# Patient Record
Sex: Male | Born: 1935 | Race: White | Hispanic: No | Marital: Married | Smoking: Former smoker
Health system: Southern US, Community
[De-identification: ages and names within clinical notes are randomized; demographics above are authoritative.]

## PROBLEM LIST (undated history)

## (undated) DIAGNOSIS — H6983 Other specified disorders of Eustachian tube, bilateral: Secondary | ICD-10-CM

## (undated) DIAGNOSIS — R918 Other nonspecific abnormal finding of lung field: Secondary | ICD-10-CM

## (undated) DIAGNOSIS — K219 Gastro-esophageal reflux disease without esophagitis: Secondary | ICD-10-CM

## (undated) DIAGNOSIS — M1990 Unspecified osteoarthritis, unspecified site: Secondary | ICD-10-CM

## (undated) DIAGNOSIS — J90 Pleural effusion, not elsewhere classified: Secondary | ICD-10-CM

## (undated) DIAGNOSIS — R1310 Dysphagia, unspecified: Secondary | ICD-10-CM

## (undated) DIAGNOSIS — R079 Chest pain, unspecified: Secondary | ICD-10-CM

## (undated) DIAGNOSIS — S22000A Wedge compression fracture of unspecified thoracic vertebra, initial encounter for closed fracture: Secondary | ICD-10-CM

## (undated) DIAGNOSIS — S336XXA Sprain of sacroiliac joint, initial encounter: Secondary | ICD-10-CM

## (undated) DIAGNOSIS — R0602 Shortness of breath: Secondary | ICD-10-CM

## (undated) DIAGNOSIS — R590 Localized enlarged lymph nodes: Secondary | ICD-10-CM

## (undated) DIAGNOSIS — F430 Acute stress reaction: Secondary | ICD-10-CM

## (undated) DIAGNOSIS — C3492 Malignant neoplasm of unspecified part of left bronchus or lung: Secondary | ICD-10-CM

## (undated) DIAGNOSIS — M47812 Spondylosis without myelopathy or radiculopathy, cervical region: Secondary | ICD-10-CM

## (undated) DIAGNOSIS — J84112 Idiopathic pulmonary fibrosis: Secondary | ICD-10-CM

## (undated) DIAGNOSIS — H2511 Age-related nuclear cataract, right eye: Secondary | ICD-10-CM

## (undated) DIAGNOSIS — M6740 Ganglion, unspecified site: Secondary | ICD-10-CM

## (undated) DIAGNOSIS — J439 Emphysema, unspecified: Secondary | ICD-10-CM

## (undated) DIAGNOSIS — C801 Malignant (primary) neoplasm, unspecified: Secondary | ICD-10-CM

## (undated) DIAGNOSIS — M7710 Lateral epicondylitis, unspecified elbow: Secondary | ICD-10-CM

## (undated) DIAGNOSIS — Z961 Presence of intraocular lens: Secondary | ICD-10-CM

## (undated) DIAGNOSIS — F419 Anxiety disorder, unspecified: Secondary | ICD-10-CM

## (undated) DIAGNOSIS — C3490 Malignant neoplasm of unspecified part of unspecified bronchus or lung: Secondary | ICD-10-CM

## (undated) DIAGNOSIS — IMO0001 Reserved for inherently not codable concepts without codable children: Secondary | ICD-10-CM

## (undated) DIAGNOSIS — K648 Other hemorrhoids: Secondary | ICD-10-CM

## (undated) DIAGNOSIS — M7989 Other specified soft tissue disorders: Secondary | ICD-10-CM

## (undated) DIAGNOSIS — G8929 Other chronic pain: Secondary | ICD-10-CM

## (undated) DIAGNOSIS — H04203 Unspecified epiphora, bilateral lacrimal glands: Secondary | ICD-10-CM

## (undated) DIAGNOSIS — J701 Chronic and other pulmonary manifestations due to radiation: Secondary | ICD-10-CM

## (undated) DIAGNOSIS — R112 Nausea with vomiting, unspecified: Secondary | ICD-10-CM

## (undated) DIAGNOSIS — J00 Acute nasopharyngitis [common cold]: Secondary | ICD-10-CM

## (undated) HISTORY — DX: Malignant (primary) neoplasm, unspecified: C80.1

## (undated) HISTORY — DX: Lateral epicondylitis, unspecified elbow: M77.10

## (undated) HISTORY — DX: Dysphagia, unspecified: R13.10

## (undated) HISTORY — DX: Spondylosis without myelopathy or radiculopathy, cervical region: M47.812

## (undated) HISTORY — DX: Unspecified osteoarthritis, unspecified site: M19.90

---
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
## 1999-10-07 ENCOUNTER — Ambulatory Visit (HOSPITAL_COMMUNITY): Admission: RE | Admit: 1999-10-07 | Discharge: 1999-10-07 | Payer: Self-pay | Admitting: *Deleted

## 1999-10-07 ENCOUNTER — Encounter: Payer: Self-pay | Admitting: *Deleted

## 2001-06-09 HISTORY — PX: COLONOSCOPY: SHX174

## 2001-06-14 ENCOUNTER — Encounter: Payer: Self-pay | Admitting: Internal Medicine

## 2001-06-14 ENCOUNTER — Ambulatory Visit (HOSPITAL_COMMUNITY): Admission: RE | Admit: 2001-06-14 | Discharge: 2001-06-14 | Payer: Self-pay | Admitting: Gastroenterology

## 2001-06-14 ENCOUNTER — Encounter: Payer: Self-pay | Admitting: Gastroenterology

## 2004-02-10 ENCOUNTER — Encounter: Admission: RE | Admit: 2004-02-10 | Discharge: 2004-02-10 | Payer: Self-pay | Admitting: Internal Medicine

## 2004-04-30 ENCOUNTER — Ambulatory Visit: Payer: Self-pay | Admitting: Internal Medicine

## 2004-06-09 HISTORY — PX: KNEE ARTHROSCOPY: SUR90

## 2004-12-06 ENCOUNTER — Ambulatory Visit: Payer: Self-pay | Admitting: Internal Medicine

## 2004-12-25 ENCOUNTER — Ambulatory Visit: Payer: Self-pay | Admitting: Internal Medicine

## 2005-04-12 ENCOUNTER — Ambulatory Visit: Payer: Self-pay | Admitting: Internal Medicine

## 2005-11-18 ENCOUNTER — Ambulatory Visit: Payer: Self-pay | Admitting: Internal Medicine

## 2005-11-27 ENCOUNTER — Ambulatory Visit: Payer: Self-pay | Admitting: Internal Medicine

## 2006-03-10 ENCOUNTER — Ambulatory Visit: Payer: Self-pay | Admitting: Internal Medicine

## 2006-06-12 ENCOUNTER — Ambulatory Visit: Payer: Self-pay | Admitting: Internal Medicine

## 2006-06-12 LAB — CONVERTED CEMR LAB
ALT: 15 units/L (ref 0–40)
AST: 17 units/L (ref 0–37)
Albumin: 3 g/dL — ABNORMAL LOW (ref 3.5–5.2)
Alkaline Phosphatase: 51 units/L (ref 39–117)
BUN: 14 mg/dL (ref 6–23)
Bilirubin, Direct: 0.1 mg/dL (ref 0.0–0.3)
Creatinine, Ser: 0.9 mg/dL (ref 0.4–1.5)
PSA: 1.25 ng/mL (ref 0.10–4.00)
Potassium: 4.3 meq/L (ref 3.5–5.1)
Total Bilirubin: 0.8 mg/dL (ref 0.3–1.2)
Total Protein: 5.9 g/dL — ABNORMAL LOW (ref 6.0–8.3)

## 2006-06-29 ENCOUNTER — Ambulatory Visit: Payer: Self-pay | Admitting: Internal Medicine

## 2006-11-19 ENCOUNTER — Ambulatory Visit: Payer: Self-pay | Admitting: Internal Medicine

## 2007-02-18 ENCOUNTER — Ambulatory Visit: Payer: Self-pay | Admitting: Internal Medicine

## 2007-02-18 DIAGNOSIS — K648 Other hemorrhoids: Secondary | ICD-10-CM | POA: Insufficient documentation

## 2007-02-18 HISTORY — DX: Other hemorrhoids: K64.8

## 2007-02-18 LAB — CONVERTED CEMR LAB
ALT: 16 units/L (ref 0–53)
AST: 17 units/L (ref 0–37)
Albumin: 3 g/dL — ABNORMAL LOW (ref 3.5–5.2)
Alkaline Phosphatase: 51 units/L (ref 39–117)
BUN: 12 mg/dL (ref 6–23)
Basophils Absolute: 0 10*3/uL (ref 0.0–0.1)
Basophils Relative: 0.3 % (ref 0.0–1.0)
Bilirubin, Direct: 0.1 mg/dL (ref 0.0–0.3)
CO2: 29 meq/L (ref 19–32)
Calcium: 8.6 mg/dL (ref 8.4–10.5)
Chloride: 108 meq/L (ref 96–112)
Cholesterol: 215 mg/dL (ref 0–200)
Creatinine, Ser: 0.9 mg/dL (ref 0.4–1.5)
Direct LDL: 147.9 mg/dL
Eosinophils Absolute: 0.1 10*3/uL (ref 0.0–0.6)
Eosinophils Relative: 1.7 % (ref 0.0–5.0)
GFR calc Af Amer: 106 mL/min
GFR calc non Af Amer: 88 mL/min
Glucose, Bld: 105 mg/dL — ABNORMAL HIGH (ref 70–99)
HCT: 40.3 % (ref 39.0–52.0)
HDL: 45.2 mg/dL (ref >39.0)
Hemoglobin: 14 g/dL (ref 13.0–17.0)
Lymphocytes Relative: 28.9 % (ref 12.0–46.0)
MCHC: 34.7 g/dL (ref 30.0–36.0)
MCV: 90.9 fL (ref 78.0–100.0)
Monocytes Absolute: 0.6 10*3/uL (ref 0.2–0.7)
Monocytes Relative: 10.2 % (ref 3.0–11.0)
Neutro Abs: 3.1 10*3/uL (ref 1.4–7.7)
Neutrophils Relative %: 58.9 % (ref 43.0–77.0)
Platelets: 195 10*3/uL (ref 150–400)
Potassium: 4.5 meq/L (ref 3.5–5.1)
RBC: 4.44 M/uL (ref 4.22–5.81)
RDW: 12.3 % (ref 11.5–14.6)
Sodium: 139 meq/L (ref 135–145)
TSH: 1.38 microintl units/mL (ref 0.35–5.50)
Total Bilirubin: 0.7 mg/dL (ref 0.3–1.2)
Total CHOL/HDL Ratio: 4.8
Total Protein: 5.4 g/dL — ABNORMAL LOW (ref 6.0–8.3)
Triglycerides: 112 mg/dL (ref 0–149)
VLDL: 22 mg/dL (ref 0–40)
WBC: 5.4 10*3/uL (ref 4.5–10.5)

## 2007-03-01 ENCOUNTER — Telehealth: Payer: Self-pay | Admitting: *Deleted

## 2007-03-02 ENCOUNTER — Telehealth: Payer: Self-pay | Admitting: *Deleted

## 2007-04-22 ENCOUNTER — Ambulatory Visit: Payer: Self-pay | Admitting: Internal Medicine

## 2007-04-22 DIAGNOSIS — M799 Soft tissue disorder, unspecified: Secondary | ICD-10-CM

## 2007-04-22 DIAGNOSIS — M6740 Ganglion, unspecified site: Secondary | ICD-10-CM

## 2007-04-22 DIAGNOSIS — M719 Bursopathy, unspecified: Secondary | ICD-10-CM

## 2007-04-22 DIAGNOSIS — M67919 Unspecified disorder of synovium and tendon, unspecified shoulder: Secondary | ICD-10-CM

## 2007-04-22 HISTORY — DX: Ganglion, unspecified site: M67.40

## 2007-04-22 HISTORY — DX: Soft tissue disorder, unspecified: M79.9

## 2007-09-23 ENCOUNTER — Ambulatory Visit: Payer: Self-pay | Admitting: Internal Medicine

## 2007-09-23 DIAGNOSIS — R232 Flushing: Secondary | ICD-10-CM

## 2007-09-23 DIAGNOSIS — IMO0001 Reserved for inherently not codable concepts without codable children: Secondary | ICD-10-CM

## 2007-09-23 HISTORY — DX: Reserved for inherently not codable concepts without codable children: IMO0001

## 2007-09-23 LAB — CONVERTED CEMR LAB
CRP, High Sensitivity: 2 (ref 0.00–5.00)
Cortisol, Plasma: 4.1 ug/dL
Free T4: 0.9 ng/dL (ref 0.6–1.6)
T3, Free: 2.8 pg/mL (ref 2.3–4.2)

## 2007-10-27 ENCOUNTER — Ambulatory Visit: Payer: Self-pay | Admitting: Internal Medicine

## 2007-10-27 DIAGNOSIS — F430 Acute stress reaction: Secondary | ICD-10-CM

## 2007-10-27 DIAGNOSIS — F438 Other reactions to severe stress: Secondary | ICD-10-CM

## 2007-10-27 HISTORY — DX: Acute stress reaction: F43.0

## 2008-02-21 ENCOUNTER — Ambulatory Visit: Payer: Self-pay | Admitting: Internal Medicine

## 2008-02-28 ENCOUNTER — Telehealth: Payer: Self-pay | Admitting: Internal Medicine

## 2008-03-14 ENCOUNTER — Ambulatory Visit: Payer: Self-pay | Admitting: Internal Medicine

## 2008-08-30 ENCOUNTER — Ambulatory Visit: Payer: Self-pay | Admitting: Internal Medicine

## 2008-08-30 LAB — CONVERTED CEMR LAB
ALT: 16 units/L (ref 0–53)
AST: 18 units/L (ref 0–37)
Albumin: 3.5 g/dL (ref 3.5–5.2)
Alkaline Phosphatase: 58 units/L (ref 39–117)
Bilirubin, Direct: 0.1 mg/dL (ref 0.0–0.3)
Cholesterol: 198 mg/dL (ref 0–200)
HDL: 52.9 mg/dL (ref >39.00)
LDL Cholesterol: 130 mg/dL — ABNORMAL HIGH (ref 0–99)
Total Bilirubin: 0.9 mg/dL (ref 0.3–1.2)
Total CHOL/HDL Ratio: 4
Total Protein: 6.6 g/dL (ref 6.0–8.3)
Triglycerides: 78 mg/dL (ref 0.0–149.0)
VLDL: 15.6 mg/dL (ref 0.0–40.0)

## 2008-09-06 ENCOUNTER — Ambulatory Visit: Payer: Self-pay | Admitting: Internal Medicine

## 2008-09-06 DIAGNOSIS — S336XXA Sprain of sacroiliac joint, initial encounter: Secondary | ICD-10-CM | POA: Insufficient documentation

## 2008-09-06 DIAGNOSIS — E7800 Pure hypercholesterolemia, unspecified: Secondary | ICD-10-CM

## 2008-09-06 HISTORY — DX: Sprain of sacroiliac joint, initial encounter: S33.6XXA

## 2008-09-06 LAB — CONVERTED CEMR LAB
Cholesterol, target level: 200 mg/dL
HDL goal, serum: 40 mg/dL
LDL Goal: 100 mg/dL

## 2009-02-27 ENCOUNTER — Ambulatory Visit: Payer: Self-pay | Admitting: Internal Medicine

## 2009-02-27 LAB — CONVERTED CEMR LAB
ALT: 15 units/L (ref 0–53)
AST: 18 units/L (ref 0–37)
Albumin: 3.7 g/dL (ref 3.5–5.2)
Alkaline Phosphatase: 56 units/L (ref 39–117)
BUN: 17 mg/dL (ref 6–23)
Basophils Absolute: 0 10*3/uL (ref 0.0–0.1)
Basophils Relative: 0.7 % (ref 0.0–3.0)
Bilirubin, Direct: 0.1 mg/dL (ref 0.0–0.3)
CO2: 26 meq/L (ref 19–32)
Calcium: 8.9 mg/dL (ref 8.4–10.5)
Chloride: 108 meq/L (ref 96–112)
Cholesterol: 199 mg/dL (ref 0–200)
Creatinine, Ser: 0.8 mg/dL (ref 0.4–1.5)
Eosinophils Absolute: 0.1 10*3/uL (ref 0.0–0.7)
Eosinophils Relative: 2 % (ref 0.0–5.0)
GFR calc non Af Amer: 100.1 mL/min (ref >60)
Glucose, Bld: 100 mg/dL — ABNORMAL HIGH (ref 70–99)
HCT: 43 % (ref 39.0–52.0)
HDL: 51.4 mg/dL (ref >39.00)
Hemoglobin: 14.9 g/dL (ref 13.0–17.0)
LDL Cholesterol: 125 mg/dL — ABNORMAL HIGH (ref 0–99)
Lymphocytes Relative: 31.2 % (ref 12.0–46.0)
Lymphs Abs: 1.9 10*3/uL (ref 0.7–4.0)
MCHC: 34.6 g/dL (ref 30.0–36.0)
MCV: 90.6 fL (ref 78.0–100.0)
Monocytes Absolute: 0.5 10*3/uL (ref 0.1–1.0)
Monocytes Relative: 8.1 % (ref 3.0–12.0)
Neutro Abs: 3.5 10*3/uL (ref 1.4–7.7)
Neutrophils Relative %: 58 % (ref 43.0–77.0)
PSA: 1.95 ng/mL (ref 0.10–4.00)
Platelets: 170 10*3/uL (ref 150.0–400.0)
Potassium: 4.1 meq/L (ref 3.5–5.1)
RBC: 4.74 M/uL (ref 4.22–5.81)
RDW: 12.3 % (ref 11.5–14.6)
Sodium: 140 meq/L (ref 135–145)
TSH: 1.03 microintl units/mL (ref 0.35–5.50)
Total Bilirubin: 0.9 mg/dL (ref 0.3–1.2)
Total CHOL/HDL Ratio: 4
Total Protein: 6.9 g/dL (ref 6.0–8.3)
Triglycerides: 113 mg/dL (ref 0.0–149.0)
VLDL: 22.6 mg/dL (ref 0.0–40.0)
WBC: 6 10*3/uL (ref 4.5–10.5)

## 2009-03-28 ENCOUNTER — Ambulatory Visit: Payer: Self-pay | Admitting: Internal Medicine

## 2009-08-28 ENCOUNTER — Ambulatory Visit: Payer: Self-pay | Admitting: Internal Medicine

## 2009-12-26 LAB — HM DIABETES EYE EXAM

## 2010-05-07 ENCOUNTER — Ambulatory Visit: Payer: Self-pay | Admitting: Internal Medicine

## 2010-05-07 ENCOUNTER — Telehealth: Payer: Self-pay | Admitting: *Deleted

## 2010-05-07 ENCOUNTER — Encounter: Payer: Self-pay | Admitting: Gastroenterology

## 2010-05-07 DIAGNOSIS — R1314 Dysphagia, pharyngoesophageal phase: Secondary | ICD-10-CM

## 2010-05-07 LAB — CONVERTED CEMR LAB
Albumin: 4 g/dL (ref 3.5–5.2)
Alkaline Phosphatase: 60 units/L (ref 39–117)
BUN: 16 mg/dL (ref 6–23)
CO2: 26 meq/L (ref 19–32)
Calcium: 8.9 mg/dL (ref 8.4–10.5)
Chloride: 106 meq/L (ref 96–112)
Direct LDL: 129.2 mg/dL
GFR calc non Af Amer: 98.36 mL/min (ref >60)
Glucose, Bld: 103 mg/dL — ABNORMAL HIGH (ref 70–99)
HDL: 57.1 mg/dL (ref >39.00)
Potassium: 4.4 meq/L (ref 3.5–5.1)

## 2010-05-08 ENCOUNTER — Encounter: Payer: Self-pay | Admitting: Internal Medicine

## 2010-06-20 ENCOUNTER — Ambulatory Visit: Admission: RE | Admit: 2010-06-20 | Payer: Self-pay | Source: Home / Self Care | Admitting: Gastroenterology

## 2010-07-07 LAB — CONVERTED CEMR LAB
ALT: 17 units/L (ref 0–53)
AST: 20 units/L (ref 0–37)
Albumin: 3.5 g/dL (ref 3.5–5.2)
Alkaline Phosphatase: 66 units/L (ref 39–117)
BUN: 16 mg/dL (ref 6–23)
Basophils Absolute: 0 10*3/uL (ref 0.0–0.1)
Basophils Relative: 0.5 % (ref 0.0–3.0)
Bilirubin, Direct: 0.1 mg/dL (ref 0.0–0.3)
CO2: 25 meq/L (ref 19–32)
Calcium: 8.4 mg/dL (ref 8.4–10.5)
Chloride: 113 meq/L — ABNORMAL HIGH (ref 96–112)
Cholesterol: 188 mg/dL (ref 0–200)
Creatinine, Ser: 0.9 mg/dL (ref 0.4–1.5)
Eosinophils Absolute: 0.1 10*3/uL (ref 0.0–0.7)
Eosinophils Relative: 0.8 % (ref 0.0–5.0)
GFR calc Af Amer: 106 mL/min
GFR calc non Af Amer: 88 mL/min
Glucose, Bld: 104 mg/dL — ABNORMAL HIGH (ref 70–99)
HCT: 43.5 % (ref 39.0–52.0)
HDL: 42 mg/dL (ref >39.0)
Hemoglobin: 15.2 g/dL (ref 13.0–17.0)
LDL Cholesterol: 129 mg/dL — ABNORMAL HIGH (ref 0–99)
Lymphocytes Relative: 17.1 % (ref 12.0–46.0)
MCHC: 35 g/dL (ref 30.0–36.0)
MCV: 89.8 fL (ref 78.0–100.0)
Monocytes Absolute: 0.7 10*3/uL (ref 0.1–1.0)
Monocytes Relative: 8.9 % (ref 3.0–12.0)
Neutro Abs: 5.9 10*3/uL (ref 1.4–7.7)
Neutrophils Relative %: 72.7 % (ref 43.0–77.0)
PSA: 1.87 ng/mL (ref 0.10–4.00)
Platelets: 171 10*3/uL (ref 150–400)
Potassium: 4.2 meq/L (ref 3.5–5.1)
RBC: 4.84 M/uL (ref 4.22–5.81)
RDW: 12.7 % (ref 11.5–14.6)
Sodium: 142 meq/L (ref 135–145)
TSH: 1.35 microintl units/mL (ref 0.35–5.50)
Total Bilirubin: 0.8 mg/dL (ref 0.3–1.2)
Total CHOL/HDL Ratio: 4.5
Total Protein: 6.6 g/dL (ref 6.0–8.3)
Triglycerides: 83 mg/dL (ref 0–149)
VLDL: 17 mg/dL (ref 0–40)
WBC: 8.1 10*3/uL (ref 4.5–10.5)

## 2010-07-09 NOTE — Letter (Signed)
Summary: New Patient letter  [facility]  [address] [place], [place] [address]   Phone: [phone]  Fax: [phone]       05/07/2010 MRN: [id number]  Jason Summers [address] [place], [place]  [address]  Dear Jason Summers,  Welcome to the Gastroenterology Division at [facility].    You are scheduled to see Dr.  Deatra Ina on 06-20-10 at [place] on the 3rd floor at [facility] [place] [employer].  We ask that you try to arrive at our office 15 minutes prior to your appointment time to allow for check-in.  We would like you to complete the enclosed self-administered evaluation form prior to your visit and bring it with you on the day of your appointment.  We will review it with you.  Also, please bring a complete list of all your medications or, if you prefer, bring the medication bottles and we will list them.  Please bring your insurance card so that we may make a copy of it.  If your insurance requires a referral to see a specialist, please bring your referral form from your primary care physician.  Co-payments are due at the time of your visit and may be paid by cash, check or credit card.     Your office visit will consist of a consult with your physician (includes a physical exam), any laboratory testing he/she may order, scheduling of any necessary diagnostic testing (e.g. x-ray, ultrasound, CT-scan), and scheduling of a procedure (e.g. Endoscopy, Colonoscopy) if required.  Please allow enough time on your schedule to allow for any/all of these possibilities.    If you cannot keep your appointment, please call [phone] to cancel or reschedule prior to your appointment date.  This allows [place] the opportunity to schedule an appointment for another patient in need of care.  If you do not cancel or reschedule by 5 p.m. the business day prior to your appointment date, you will be charged a $50.00 late cancellation/no-show fee.    Thank you for choosing [facility] for your medical needs.  We appreciate the opportunity to care for you.  Please visit [place] at our website  to learn more about our practice.                     Sincerely,                                                             The Gastroenterology Division

## 2010-07-09 NOTE — Assessment & Plan Note (Signed)
Summary: CPX (PT WILL COME IN FASTING) // RS/flu shot/RCD   Vital Signs:  Patient profile:   77 year old male Height:      71 inches Weight:      168 pounds BMI:     23.52 Temp:     98.2 degrees F oral Pulse rate:   72 / minute Resp:     14 per minute BP sitting:   110 / 70  (left arm)  Vitals Entered By: Allyne Gee, LPN (May 07, 5399 9:33 AM) CC: annual visit for disease mangement- fasting this am Is Patient Diabetic? No   CC:  annual visit for disease mangement- fasting this am.  History of Present Illness: has a problem with swallowing-- new problem solids get stuck and has to drink several glasses of water had colon with stark no hx of strictures, no recent cxr no weight loss denies heart burn  and is noit on a PPI I have personally reviewed the Medicare Annual Wellness questionnaire and have noted 1.   The patient's medical and social history 2.   Their use of alcohol, tobacco or illicit drugs 3.   Their current medications and supplements 4.   The patient's functional ability including ADL's, fall risks, home safety risks and hearing or visual             impairment. 5.   Diet and physical activities 6.   Evidence for depression or mood disorders The patients weight, height, BMI and visual acuity have been recorded in the chart I have made referrals, counseling and provided education to the patient based review of the above and I have provided the pt with a written personalized care plan for preventive services.    Preventive Screening-Counseling & Management  Alcohol-Tobacco     Smoking Status: current--very,very little     Smoking Cessation Counseling: yes     Packs/Day: 1-2 cigarettes a month     Passive Smoke Exposure: no     Tobacco Counseling: to remain off tobacco products  Problems Prior to Update: 1)  Hypercholesterolemia, Mild  (ICD-272.0) 2)  Sprain and Strain of Sacroiliac  (ICD-846.1) 3)  Other Acute Reactions To Stress   (ICD-308.3) 4)  Facial Flushing  (ICD-782.62) 5)  Ganglion of Joint  (ICD-727.41) 6)  Bursitis, Subdeltoid  (ICD-726.10) 7)  Preventive Health Care  (ICD-V70.0) 8)  Hemorrhoids, Internal  (ICD-455.0) 9)  Osteoarthritis  (ICD-715.90) 10)  Family History of Cad Male 1st Degree Relative <50  (ICD-V17.3)  Medications Prior to Update: 1)  Bayer Low Strength 81 Mg  Tbec (Aspirin) .... Once Daily 2)  Glucosamine-Chondroitin 500-400 Mg  Tabs (Glucosamine-Chondroitin) .... Two Times A Day 3)  Fish Oil 1000 Mg  Caps (Omega-3 Fatty Acids) .Marland Kitchen.. 1 Two Times A Day 4)  Alprazolam 0.25 Mg  Tabs (Alprazolam) .... One By Mouth Every 4 Hours As Need For Panic ( Sob)  Current Medications (verified): 1)  Bayer Low Strength 81 Mg  Tbec (Aspirin) .... Once Daily 2)  Glucosamine-Chondroitin 500-400 Mg  Tabs (Glucosamine-Chondroitin) .... Two Times A Day 3)  Fish Oil 1000 Mg  Caps (Omega-3 Fatty Acids) .Marland Kitchen.. 1 Two Times A Day 4)  Alprazolam 0.25 Mg  Tabs (Alprazolam) .... One By Mouth Every 4 Hours As Need For Panic ( Sob)  Allergies (verified): No Known Drug Allergies  Directives (verified): 1)  Deferred   Past History:  Family History: Last updated: 01/12/2007 Family History of CAD Male 1st degree relative <50 Family History  Hypertension Family History of Cardiovascular disorder  Social History: Last updated: 02/18/2007 Retired Married Current Smoker  Risk Factors: Smoking Status: current--very,very little (05/07/2010) Packs/Day: 1-2 cigarettes a month (05/07/2010) Passive Smoke Exposure: no (05/07/2010)  Past medical, surgical, family and social histories (including risk factors) reviewed, and no changes noted (except as noted below).  Past Medical History: Reviewed history from 02/21/2008 and no changes required. Osteoarthritis neck pain with cervical spondylosis lateral epicondylitis hemorrhoids  Past Surgical History: Reviewed history from 02/18/2007 and no changes  required. colonoscopy 2003  Family History: Reviewed history from 01/12/2007 and no changes required. Family History of CAD Male 1st degree relative <50 Family History Hypertension Family History of Cardiovascular disorder  Social History: Reviewed history from 02/18/2007 and no changes required. Retired Married Current Smoker Packs/Day:  1-2 cigarettes a month Smoking Status:  current--very,very little  Review of Systems  The patient denies anorexia, fever, weight loss, weight gain, vision loss, decreased hearing, hoarseness, chest pain, syncope, dyspnea on exertion, peripheral edema, prolonged cough, headaches, hemoptysis, abdominal pain, melena, hematochezia, severe indigestion/heartburn, hematuria, incontinence, genital sores, muscle weakness, suspicious skin lesions, transient blindness, difficulty walking, depression, unusual weight change, abnormal bleeding, enlarged lymph nodes, angioedema, breast masses, and testicular masses.    Physical Exam  General:  Well-developed,well-nourished,in no acute distress; alert,appropriate and cooperative throughout examination Head:  normocephalic.  male-pattern balding.   Eyes:  pupils equal, pupils round, and pupils reactive to light.   Ears:  R ear normal and L ear normal.   Nose:  External nasal examination shows no deformity or inflammation. Nasal mucosa are pink and moist without lesions or exudates. Mouth:  good dentition and pharynx pink and moist.   Neck:  No deformities, masses, or tenderness noted. Lungs:  normal respiratory effort and no wheezes.   Heart:  normal rate and regular rhythm.   Abdomen:  soft, non-tender, and normal bowel sounds.   Rectal:  normal sphincter tone and no masses.   Prostate:  no nodules and 1+ enlarged.   Msk:  lumbar lordosis and SI joint tenderness.   Pulses:  R and L carotid,radial,femoral,dorsalis pedis and posterior tibial pulses are full and equal bilaterally Extremities:  No clubbing,  cyanosis, edema, or deformity noted with normal full range of motion of all joints.   Neurologic:  No cranial nerve deficits noted. Station and gait are normal. Plantar reflexes are down-going bilaterally. DTRs are symmetrical throughout. Sensory, motor and coordinative functions appear intact.   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) Assessment New  no hx of GERD nor current complaints he states that this has been going on for a while but recently has become problematic requiring helps swallowing by drinking water smoking hx needed CXR and EGD refer to [facility] x[facility] today  Orders: T-2 View CXR (71020TC) Gastroenterology Referral (GI)  Problem # 2:  HYPERCHOLESTEROLEMIA, MILD (ICD-272.0) Assessment: Unchanged  Orders: TLB-Lipid Panel (80061-LIPID)  Labs Reviewed: SGOT: 18 (02/27/2009)   SGPT: 15 (02/27/2009)  Lipid Goals: Chol Goal: 200 (09/06/2008)   HDL Goal: 40 (09/06/2008)   LDL Goal: 100 (09/06/2008)   TG Goal: 150 (09/06/2008)  Prior 10 Yr Risk Heart Disease: 22 % (09/06/2008)   HDL:51.40 (02/27/2009), 52.90 (08/30/2008)  LDL:125 (02/27/2009), 130 (08/30/2008)  Chol:199 (02/27/2009), 198 (08/30/2008)  Trig:113.0 (02/27/2009), 78.0 (08/30/2008)  Problem # 3:  OSTEOARTHRITIS (ICD-715.90)  His updated medication list for this problem includes:    Bayer Low Strength 81 Mg Tbec (Aspirin) ..... Once daily  Discussed  use of medications, application of heat or cold, and exercises.   Problem # 4:  PREVENTIVE HEALTH CARE (ICD-V70.0)  The pt was asked about all immunizations, health maint. services that are appropriate to their age and was given guidance on diet exercize  and weight management  Colonoscopy: normal (06/14/2001) Td Booster: Historical (06/09/2005)   Flu Vax: Fluvax 3+ (03/28/2009)   Pneumovax: Historical (06/09/2004) Chol: 199 (02/27/2009)   HDL: 51.40 (02/27/2009)   LDL: 125 (02/27/2009)   TG: 113.0  (02/27/2009) TSH: 1.03 (02/27/2009)   PSA: 1.95 (02/27/2009) Next Colonoscopy due:: 06/2011 (08/28/2009)  Discussed using sunscreen, use of alcohol, drug use, self testicular exam, routine dental care, routine eye care, routine physical exam, seat belts, multiple vitamins, osteoporosis prevention, adequate calcium intake in diet, and recommendations for immunizations.  Discussed exercise and checking cholesterol.  Discussed gun safety, safe sex, and contraception. Also recommend checking PSA.  Complete Medication List: 1)  Bayer Low Strength 81 Mg Tbec (Aspirin) .... Once daily 2)  Glucosamine-chondroitin 500-400 Mg Tabs (Glucosamine-chondroitin) .... Two times a day 3)  Fish Oil 1000 Mg Caps (Omega-3 fatty acids) .Marland Kitchen.. 1 two times a day 4)  Alprazolam 0.25 Mg Tabs (Alprazolam) .... One by mouth every 4 hours as need for panic ( sob)  Other Orders: TLB-CMP (Comprehensive Metabolic Pnl) ([id number]) Medicare -1st Annual Wellness Visit [phone])  Patient Instructions: 1)  I have provided you with a copy of your personalized plan for preventive services. Please take the time to review along with your updated medication list. 2)  Please schedule a follow-up appointment in 6 months. 3)  Get the chest xray today 4)  we will refer you to Dr Elbert Ewings for the swallowing 5)  we have given you samples of a sleep aid to try 6)  "silenor" one at bedtime   Orders Added: 1)  TLB-Lipid Panel [80061-LIPID] 2)  TLB-CMP (Comprehensive Metabolic Pnl) [[id number]] 3)  T-2 View CXR [71020TC] 4)  Gastroenterology Referral [GI] 5)  Medicare -1st Annual Wellness Visit [L8921] 6)  Est. Patient Level IV [19417]    Prevention & Chronic Care Immunizations   Influenza vaccine: Fluvax 3+  (03/28/2009)    Tetanus booster: 06/09/2005: Historical   Tetanus booster due: 06/10/2015    Pneumococcal vaccine: Historical  (06/09/2004)   Pneumococcal vaccine deferral: Not indicated  (05/07/2010)    H. zoster vaccine:  Not documented  Colorectal Screening   Hemoccult: Not documented    Colonoscopy: normal  (06/14/2001)   Colonoscopy due: 06/2011  Other Screening   PSA: 1.95  (02/27/2009)   PSA action/deferral: Discussed-PSA requested  (05/07/2010)   Smoking status: current--very,very little  (05/07/2010)  Lipids   Total Cholesterol: 199  (02/27/2009)   Lipid panel action/deferral: Lipid Panel ordered   LDL: 125  (02/27/2009)   LDL Direct: 147.9  (02/18/2007)   HDL: 51.40  (02/27/2009)   Triglycerides: 113.0  (02/27/2009)    SGOT (AST): 18  (02/27/2009)   BMP action: Ordered   SGPT (ALT): 15  (02/27/2009) CMP ordered    Alkaline phosphatase: 56  (02/27/2009)   Total bilirubin: 0.9  (02/27/2009)    Lipid flowsheet reviewed?: Yes   Progress toward LDL goal: At goal  Self-Management Support :    Patient will work on the following items until the next clinic visit to reach self-care goals:     Medications and monitoring: take my medicines every day  (05/07/2010)     Eating: eat more vegetables  (05/07/2010)     Activity: take  a 30 minute walk every day, take the stairs instead of the elevator, park at the far end of the parking lot, join a walking program  (05/07/2010)    Lipid self-management support: Not documented    Nursing Instructions: Give Flu vaccine today    Appended Document: Orders Update    Clinical Lists Changes  Orders: Added new Service order of Specimen Handling (99000) - Signed      Appended Document: Orders Update    Clinical Lists Changes  Orders: Added new Service order of Flu Vaccine 9yr + MEDICARE PATIENTS ((M7672 - Signed Added new Service order of Administration Flu vaccine - MCR ((C9470 - Signed Observations: Added new observation of ROS: Flu Vaccine Consent Questions     Do you have a history of severe allergic reactions to this vaccine? no    Any prior history of allergic reactions to egg and/or gelatin? no    Do you have a sensitivity  to the preservative Thimersol? no    Do you have a past history of Guillan-Barre Syndrome? no    Do you currently have an acute febrile illness? no    Have you ever had a severe reaction to latex? no    Vaccine information given and explained to patient? yes    Are you currently pregnant? no    Lot Number:[id number]   Exp Date:12/07/2010   Site Given  Left Deltoid IM  (05/09/2010 8:39) Added new observation of FLU VAX VIS: 01/01/2010 version (05/09/2010 8:39) Added new observation of FLU VAXLOT: [id number](05/09/2010 8:39) Added new observation of FLU VAXMFR: [employer] (05/09/2010 8:39) Added new observation of FLU VAX EXP: 12/07/2010 (05/09/2010 8:39) Added new observation of FLU VAX DSE: 0.559m(05/09/2010 8:39) Added new observation of FLU VAX: Fluvax 3+ (05/09/2010 8:39)         Review of Systems       Flu Vaccine Consent Questions     Do you have a history of severe allergic reactions to this vaccine? no    Any prior history of allergic reactions to egg and/or gelatin? no    Do you have a sensitivity to the preservative Thimersol? no    Do you have a past history of Guillan-Barre Syndrome? no    Do you currently have an acute febrile illness? no    Have you ever had a severe reaction to latex? no    Vaccine information given and explained to patient? yes    Are you currently pregnant? no    Lot Number:[id number]   Exp Date:12/07/2010   Site Given  Left Deltoid IM

## 2010-07-09 NOTE — Progress Notes (Signed)
Summary: Pts Opthamologist is Dr Satira Sark at [facility] Note Call from Patient Call back at [facility] Phone [phone]   Caller: spouse-Laura Summary of Call: Pt said that his eye doctor is, Dr. Satira Sark at [facility].     Initial call taken by: Braulio Bosch,  May 07, 2010 11:33 AM               Diabetes Management Assessment/Plan:      The following lipid goals have been established for the patient: Total cholesterol goal of 200; LDL cholesterol goal of 100; HDL cholesterol goal of 40; Triglyceride goal of 150.     Diabetes Management Exam:    Eye Exam:       Eye Exam done elsewhere          Date: 01/02/2010          Results: normal          Done by: dr Satira Sark

## 2010-07-09 NOTE — Letter (Signed)
Summary: MEDICARE QUESTIONNAIRE  MEDICARE QUESTIONNAIRE   Imported By: Betsy Pries 05/08/2010 10:07:44  _____________________________________________________________________  External Attachment:    Type:   Image     Comment:   External Document

## 2010-07-09 NOTE — Assessment & Plan Note (Signed)
Summary: 6 month roa//lh [place] BMP OK PER LAURA WIFE/NJR   Vital Signs:  Patient profile:   77 year old male Height:      71 inches Weight:      170 pounds BMI:     23.80 Temp:     98.2 degrees F oral Pulse rate:   64 / minute Resp:     1464 per minute BP sitting:   120 / 70  (left arm)  Vitals Entered By: Allyne Gee, LPN (August 29, 4694 2:95 AM) CC: roa, Lipid Management   CC:  roa and Lipid Management.  History of Present Illness: follow up joint pain, lipids and HTN risks with mild chronic anxiety he has plans to go to [place] two [place]  Lipid Management History:      Positive NCEP/ATP III risk factors include male age 85 years old or older and current tobacco user.  Negative NCEP/ATP III risk factors include no family history for ischemic heart disease, non-hypertensive, no ASHD (atherosclerotic heart disease), no prior stroke/TIA, no peripheral vascular disease, and no history of aortic aneurysm.     Preventive Screening-Counseling & Management  Alcohol-Tobacco     Smoking Status: current     Smoking Cessation Counseling: yes     Packs/Day: 1/2     Passive Smoke Exposure: no  Problems Prior to Update: 1)  Hypercholesterolemia, Mild  (ICD-272.0) 2)  Sprain and Strain of Sacroiliac  (ICD-846.1) 3)  Other Acute Reactions To Stress  (ICD-308.3) 4)  Facial Flushing  (ICD-782.62) 5)  Ganglion of Joint  (ICD-727.41) 6)  Bursitis, Subdeltoid  (ICD-726.10) 7)  Preventive Health Care  (ICD-V70.0) 8)  Hemorrhoids, Internal  (ICD-455.0) 9)  Osteoarthritis  (ICD-715.90) 10)  Family History of Cad Male 1st Degree Relative <50  (ICD-V17.3)  Current Problems (verified): 1)  Hypercholesterolemia, Mild  (ICD-272.0) 2)  Sprain and Strain of Sacroiliac  (ICD-846.1) 3)  Other Acute Reactions To Stress  (ICD-308.3) 4)  Facial Flushing  (ICD-782.62) 5)  Ganglion of Joint  (ICD-727.41) 6)  Bursitis, Subdeltoid  (ICD-726.10) 7)  Preventive Health Care  (ICD-V70.0) 8)   Hemorrhoids, Internal  (ICD-455.0) 9)  Osteoarthritis  (ICD-715.90) 10)  Family History of Cad Male 1st Degree Relative <50  (ICD-V17.3)  Medications Prior to Update: 1)  Bayer Low Strength 81 Mg  Tbec (Aspirin) .... Once Daily 2)  Glucosamine-Chondroitin 500-400 Mg  Tabs (Glucosamine-Chondroitin) .... Two Times A Day 3)  Fish Oil 1000 Mg  Caps (Omega-3 Fatty Acids) .Marland Kitchen.. 1 Two Times A Day 4)  Alprazolam 0.25 Mg  Tabs (Alprazolam) .... One By Mouth Every 4 Hours As Need For Panic ( Sob)  Current Medications (verified): 1)  Bayer Low Strength 81 Mg  Tbec (Aspirin) .... Once Daily 2)  Glucosamine-Chondroitin 500-400 Mg  Tabs (Glucosamine-Chondroitin) .... Two Times A Day 3)  Fish Oil 1000 Mg  Caps (Omega-3 Fatty Acids) .Marland Kitchen.. 1 Two Times A Day 4)  Alprazolam 0.25 Mg  Tabs (Alprazolam) .... One By Mouth Every 4 Hours As Need For Panic ( Sob)  Allergies (verified): No Known Drug Allergies  Past History:  Family History: Last updated: 01/12/2007 Family History of CAD Male 1st degree relative <50 Family History Hypertension Family History of Cardiovascular disorder  Social History: Last updated: 02/18/2007 Retired Married Current Smoker  Risk Factors: Smoking Status: current (08/28/2009) Packs/Day: 1/2 (08/28/2009) Passive Smoke Exposure: no (08/28/2009)  Past medical, surgical, family and social histories (including risk factors) reviewed, and no changes noted (except as  noted below).  Past Medical History: Reviewed history from 02/21/2008 and no changes required. Osteoarthritis neck pain with cervical spondylosis lateral epicondylitis hemorrhoids  Past Surgical History: Reviewed history from 02/18/2007 and no changes required. colonoscopy 2003  Family History: Reviewed history from 01/12/2007 and no changes required. Family History of CAD Male 1st degree relative <50 Family History Hypertension Family History of Cardiovascular disorder  Social History: Reviewed  history from 02/18/2007 and no changes required. Retired Married Current Smoker  Review of Systems  The patient denies anorexia, fever, weight loss, weight gain, vision loss, decreased hearing, hoarseness, chest pain, syncope, dyspnea on exertion, peripheral edema, prolonged cough, headaches, hemoptysis, abdominal pain, melena, hematochezia, severe indigestion/heartburn, hematuria, incontinence, genital sores, muscle weakness, suspicious skin lesions, transient blindness, difficulty walking, depression, unusual weight change, abnormal bleeding, enlarged lymph nodes, angioedema, and breast masses.    Physical Exam  General:  Well-developed,well-nourished,in no acute distress; alert,appropriate and cooperative throughout examination Head:  normocephalic.  male-pattern balding.   Eyes:  pupils equal, pupils round, and pupils reactive to light.   Ears:  R ear normal and L ear normal.   Nose:  External nasal examination shows no deformity or inflammation. Nasal mucosa are pink and moist without lesions or exudates. Mouth:  good dentition and pharynx pink and moist.   Neck:  No deformities, masses, or tenderness noted. Lungs:  normal respiratory effort and no wheezes.   Heart:  normal rate and regular rhythm.   Abdomen:  soft, non-tender, and normal bowel sounds.   Msk:  lumbar lordosis and SI joint tenderness.   Pulses:  R and L carotid,radial,femoral,dorsalis pedis and posterior tibial pulses are full and equal bilaterally Extremities:  No clubbing, cyanosis, edema, or deformity noted with normal full range of motion of all joints.     Impression & Recommendations:  Problem # 1:  HYPERCHOLESTEROLEMIA, MILD (ICD-272.0) Assessment Unchanged  Labs Reviewed: SGOT: 18 (02/27/2009)   SGPT: 15 (02/27/2009)  Lipid Goals: Chol Goal: 200 (09/06/2008)   HDL Goal: 40 (09/06/2008)   LDL Goal: 100 (09/06/2008)   TG Goal: 150 (09/06/2008)  Prior 10 Yr Risk Heart Disease: 22 % (09/06/2008)    HDL:51.40 (02/27/2009), 52.90 (08/30/2008)  LDL:125 (02/27/2009), 130 (08/30/2008)  Chol:199 (02/27/2009), 198 (08/30/2008)  Trig:113.0 (02/27/2009), 78.0 (08/30/2008)  Problem # 2:  OSTEOARTHRITIS (ICD-715.90) Assessment: Unchanged mild by standards, exercizing 3-4 weeks His updated medication list for this problem includes:    Bayer Low Strength 81 Mg Tbec (Aspirin) ..... Once daily  Discussed use of medications, application of heat or cold, and exercises.   Problem # 3:  SPRAIN AND STRAIN OF SACROILIAC (ICD-846.1) chronic back probl,ems but controlled with support belts  Complete Medication List: 1)  Bayer Low Strength 81 Mg Tbec (Aspirin) .... Once daily 2)  Glucosamine-chondroitin 500-400 Mg Tabs (Glucosamine-chondroitin) .... Two times a day 3)  Fish Oil 1000 Mg Caps (Omega-3 fatty acids) .Marland Kitchen.. 1 two times a day 4)  Alprazolam 0.25 Mg Tabs (Alprazolam) .... One by mouth every 4 hours as need for panic ( sob)  Lipid Assessment/Plan:      Based on NCEP/ATP III, the patient's risk factor category is "0-1 risk factors".  The patient's lipid goals are as follows: Total cholesterol goal is 200; LDL cholesterol goal is 100; HDL cholesterol goal is 40; Triglyceride goal is 150.  His LDL cholesterol goal has been met.    Patient Instructions: 1)  Please schedule a follow-up appointment in 6 months.  CPX    Preventive Care  Screening  Colonoscopy:    Next Due:  06/2011

## 2010-07-11 NOTE — Procedures (Signed)
Summary: Colonoscopy   Colonoscopy  Procedure date:  06/14/2001  Findings:      Results: Hemorrhoids.     Location:  [facility]   Patient Name: Jason, Summers MRN: [id number] Procedure Procedures: Colonoscopy CPT: [id number].  Personnel: Endoscopist: Sandy Salaam. Deatra Ina, MD.  Indications Symptoms: Rectal pain.  History  Pre-Exam Physical: Performed Jun 14, 2001. Entire physical exam was normal.  Exam Exam: Extent of exam reached: Cecum, extent intended: Cecum.  The cecum was identified by IC valve. ASA Classification: II. Tolerance: excellent.  Monitoring: Pulse and BP monitoring, Oximetry used. Supplemental O2 given.  Colon Prep Used Golytely for colon prep. Prep results: good.  Sedation Meds: Fentanyl 75 mcg. given IV. Versed 6 mg. given IV.  Findings - NORMAL EXAM: Cecum to Rectum.  HEMORRHOIDS: Internal. ICD9: Hemorrhoids, Internal: 455.0.   Assessment Abnormal examination, see findings above.  Diagnoses: 455.0: Hemorrhoids, Internal.   Events  Unplanned Interventions: No intervention was required.  Unplanned Events: There were no complications. Plans Medication Plan: Continue current medications.  Scheduling/Referral: Follow-Up prn.  This report was created from the original endoscopy report, which was reviewed and signed by the above listed endoscopist.

## 2010-07-15 ENCOUNTER — Telehealth: Payer: Self-pay | Admitting: Gastroenterology

## 2010-07-17 ENCOUNTER — Encounter: Payer: Self-pay | Admitting: Gastroenterology

## 2010-07-17 ENCOUNTER — Ambulatory Visit (INDEPENDENT_AMBULATORY_CARE_PROVIDER_SITE_OTHER): Payer: Medicare Other | Admitting: Physician Assistant

## 2010-07-17 ENCOUNTER — Encounter: Payer: Self-pay | Admitting: Physician Assistant

## 2010-07-17 DIAGNOSIS — M1990 Unspecified osteoarthritis, unspecified site: Secondary | ICD-10-CM

## 2010-07-17 DIAGNOSIS — R1310 Dysphagia, unspecified: Secondary | ICD-10-CM | POA: Insufficient documentation

## 2010-07-17 HISTORY — DX: Dysphagia, unspecified: R13.10

## 2010-07-17 HISTORY — DX: Unspecified osteoarthritis, unspecified site: M19.90

## 2010-07-25 ENCOUNTER — Encounter: Payer: Self-pay | Admitting: Gastroenterology

## 2010-07-25 ENCOUNTER — Other Ambulatory Visit (AMBULATORY_SURGERY_CENTER): Payer: Medicare Other | Admitting: Gastroenterology

## 2010-07-25 ENCOUNTER — Other Ambulatory Visit: Payer: Self-pay | Admitting: Gastroenterology

## 2010-07-25 DIAGNOSIS — R1310 Dysphagia, unspecified: Secondary | ICD-10-CM

## 2010-07-25 DIAGNOSIS — K2270 Barrett's esophagus without dysplasia: Secondary | ICD-10-CM

## 2010-07-25 DIAGNOSIS — K222 Esophageal obstruction: Secondary | ICD-10-CM

## 2010-07-25 NOTE — Progress Notes (Signed)
Summary: Triage  Phone Note Call from Patient   Caller: Jason Summers [phone] Call For: Dr. Deatra Ina Reason for Call: Talk to Nurse Summary of Call: pt. has an appt. on 07-29-10 and requesting sooner appt. He has been having problems swallowing and choaking Initial call taken by: Webb Laws,  July 15, 2010 9:18 AM  Follow-up for Phone Call        Left message to call back Follow-up by: Rosanne Sack RN,  July 15, 2010 10:36 AM  Additional Follow-up for Phone Call Additional follow up Details #1::        Spoke with wife and made appointment for patient to see Nicoletta Ba, PA 07/17/10 _0 . Spouse verbalized understanding. Additional Follow-up by: Rosanne Sack RN,  July 15, 2010 2:29 PM

## 2010-07-25 NOTE — Assessment & Plan Note (Signed)
Summary: Difficulty swallowing/No show/co-pay/[facility]   History of Present Illness Visit Type: Initial Consult Primary GI MD: Erskine Emery MD [facility] Primary Provider: Benay Pillow, MD Requesting Provider: Benay Pillow, MD Chief Complaint: dyphagia, food getting stuck with up to 3 glasses of water to dislodge History of Present Illness:   [name] KNOWN TO DR. KAPLAN FROM COLONOSCOPY IN 2003. THIS WAS NORMAL,EXCEPT FOR INTERNAL HEMORRHOIDS. HE COMES IN TODAY WITH C/O INTERMITTENT SOLID FOOD DYSPHAGIA OVER THE PAST COUPLE YEARS. NOW OVER THE PAST 4 MONTHS OR SO HAS GOTTEN TO THE POINT OF HAVING SXS DAILY. HE HAS NO DIFFICUTLY WITH PILLS OR LIQUIDS BUT WITH SOLID FOOD OFTEN HAS TO STOP EATING TO ALLOW FOOD TO PASS, AND/OR DRINK A COUPLE GLASSES OF WATER TO [place] IT DOWN. NO REGURGITATION. NO ABDOMINAL PAIN. APPETITE FINE, WEIGHT STABLE. NO HEARTBURN OR INDIGESTION.   GI Review of Systems    Reports dysphagia with solids.      Denies abdominal pain, acid reflux, belching, bloating, chest pain, dysphagia with liquids, heartburn, loss of appetite, nausea, vomiting, vomiting blood, weight loss, and  weight gain.        Denies anal fissure, black tarry stools, change in bowel habit, constipation, diarrhea, diverticulosis, fecal incontinence, heme positive stool, hemorrhoids, irritable bowel syndrome, jaundice, light color stool, liver problems, rectal bleeding, and  rectal pain. Preventive Screening-Counseling & Management      Drug Use:  no.      Current Medications (verified): 1)  Bayer Low Strength 81 Mg  Tbec (Aspirin) .... Once Daily 2)  Glucosamine-Chondroitin 500-400 Mg  Tabs (Glucosamine-Chondroitin) .... Two Times A Day 3)  Fish Oil 1000 Mg  Caps (Omega-3 Fatty Acids) .Marland Kitchen.. 1 Two Times A Day  Allergies (verified): No Known Drug Allergies  Past History:  Past Medical History: Reviewed history from 02/21/2008 and no changes required. Osteoarthritis neck pain with cervical  spondylosis lateral epicondylitis hemorrhoids  Past Surgical History: Reviewed history from 02/18/2007 and no changes required. colonoscopy 2003  Family History: Reviewed history from 01/12/2007 and no changes required. Family History of CAD Male 1st degree relative <50 Family History Hypertension Family History of Cardiovascular disorder  Social History: Retired Married Current Smoker Alcohol Use - no Daily Caffeine Use Illicit Drug Use - no  Review of Systems       The patient complains of back pain.  The patient denies allergy/sinus, anemia, anxiety-new, arthritis/joint pain, blood in urine, breast changes/lumps, change in vision, confusion, cough, coughing up blood, depression-new, fainting, fatigue, fever, headaches-new, hearing problems, heart murmur, heart rhythm changes, itching, menstrual pain, muscle pains/cramps, night sweats, nosebleeds, pregnancy symptoms, shortness of breath, skin rash, sleeping problems, sore throat, swelling of feet/legs, swollen lymph glands, thirst - excessive , urination - excessive , urination changes/pain, urine leakage, vision changes, and voice change.         SEE HPI  Vital Signs:  Patient profile:   77 year old male Height:      71 inches Weight:      174.13 pounds BMI:     24.37 Pulse rate:   64 / minute Pulse rhythm:   regular BP sitting:   120 / 70  (left arm) Cuff size:   regular  Vitals Entered By: June McMurray Okabena Deborra Medina) (July 17, 2010 11:04 AM)  Physical Exam  General:  Well developed, well nourished, no acute distress. Head:  Normocephalic and atraumatic. Eyes:  PERRLA, no icterus. Lungs:  Clear throughout to auscultation. Heart:  Regular rate  and rhythm; no murmurs, rubs,  or bruits. Abdomen:  SOFT, NONTENDER, NO MASS OR HSM,BS+ Rectal:  NOT DONE Extremities:  No clubbing, cyanosis, edema or deformities noted. Neurologic:  Alert and  oriented x4;  grossly normal neurologically. Psych:  Alert and cooperative.  Normal mood and affect.   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 KAPLAN. PROCEDURE DISCUSSED  IN DETAIL WITH PT AND HIS WIFE  CAREFUL CHEWING/SMALL BITES IN THE INTERIM  Problem # 2:  PREVENTIVE HEALTH CARE (ICD-V70.0) Assessment: Comment Only PT DUE FOR FOLOW UP COLONOSCOPY IN 06/2011  Other Orders: EGD SAV (EGD SAV)   Patient Instructions: 1)  We have scheduled the Endoscopy with Dr. Erskine Emery on 07-25-2010. 2)  Copy sent to : Dr. Benay Pillow

## 2010-07-25 NOTE — Letter (Signed)
Summary: EGD Instructions  [facility]  [place], [place] [address]   Phone: [phone]  Fax: [phone]       Jason Summers    1931-07-30    MRN: [id number]       Procedure Day /Date: 07-25-2010      Arrival Time:  7:30 AM      Procedure Time: 8:00 AM     Location of Procedure:                    X    [facility] (4th Floor)  PREPARATION FOR ENDOSCOPY   On 07-25-2010  THE DAY OF THE PROCEDURE:  1.   No solid foods, milk or milk products are allowed after midnight the night before your procedure.  2.   Do not drink anything colored red or purple.  Avoid juices with pulp.  No orange juice.  3.  You may drink clear liquids until 6:00 AM , which is 2 hours before your procedure.                                                                                                CLEAR LIQUIDS INCLUDE: Water Jello Ice Popsicles Tea (sugar ok, no milk/cream) Powdered fruit flavored drinks Coffee (sugar ok, no milk/cream) Gatorade Juice: apple, white grape, white cranberry  Lemonade Clear bullion, consomm, broth Carbonated beverages (any kind) Strained chicken noodle soup Hard Candy   MEDICATION INSTRUCTIONS  Unless otherwise instructed, you should take regular prescription medications with a small sip of water as early as possible the morning of your procedure.   Additional medication instructions: Stay off the Aspirin until after the procedure on 07-25-2010.           OTHER INSTRUCTIONS  You will need a responsible adult at least 75 years of age to accompany you and drive you home.   This person must remain in the waiting room during your procedure.  Wear loose fitting clothing that is easily removed.  Leave jewelry and other valuables at home.  However, you may wish to bring a book to read or an iPod/MP3 player to listen to music as you wait for your procedure to start.  Remove all body piercing jewelry and leave at  home.  Total time from sign-in until discharge is approximately 2-3 hours.  You should go home directly after your procedure and rest.  You can resume normal activities the day after your procedure.  The day of your procedure you should not:   Drive   Make legal decisions   Operate machinery   Drink alcohol   Return to work  You will receive specific instructions about eating, activities and medications before you leave.    The above instructions have been reviewed and explained to me by   _______________________    I fully understand and can verbalize these instructions _____________________________ Date _________

## 2010-07-29 ENCOUNTER — Ambulatory Visit: Payer: Self-pay | Admitting: Gastroenterology

## 2010-07-31 ENCOUNTER — Encounter: Payer: Self-pay | Admitting: Gastroenterology

## 2010-07-31 NOTE — Miscellaneous (Signed)
  Clinical Lists Changes  Medications: Added new medication of OMEPRAZOLE 20 MG  CPDR (OMEPRAZOLE) 1 each day 30 minutes before meal - Signed Rx of OMEPRAZOLE 20 MG  CPDR (OMEPRAZOLE) 1 each day 30 minutes before meal;  #30 x 2;  Signed;  Entered by: [name] MD;  Authorized by: [name] MD;  Method used: Electronically to [facility] [phone]*, [address] [place], [place], [place]  [address], Ph: [phone] or [phone], Fax: [phone]    Prescriptions: OMEPRAZOLE 20 MG  CPDR (OMEPRAZOLE) 1 each day 30 minutes before meal  #30 x 2   Entered and Authorized by:   [name] MD   Signed by:   [name] MD on 07/25/2010   Method used:   Electronically to        [facility]. [phone]* (retail)       [place]       [place], [place]  [address]       Ph: [phone] or [phone]       Fax: [phone]   RxID:   [id number]

## 2010-07-31 NOTE — Procedures (Addendum)
Summary: Upper Endoscopy  Patient: Jason Summers Note: All result statuses are Final unless otherwise noted.  Tests: (1) Upper Endoscopy (EGD)   EGD Upper Endoscopy       [facility]     [place], [place]  [address]           ENDOSCOPY PROCEDURE REPORT           PATIENT:  Jason Summers, Jason Summers  MR#:  [id number]     BIRTHDATE:  Jan 15, 1932, 63 yrs. old  GENDER:  male           ENDOSCOPIST:  Sandy Salaam. Deatra Ina, MD     Referred by:           PROCEDURE DATE:  07/25/2010     PROCEDURE:  EGD with biopsy, 43239, Maloney Dilation of Esophagus     ASA CLASS:  Class II     INDICATIONS:  dysphagia           MEDICATIONS:   Fentanyl 50 mcg IV, Versed 5 mg IV, glycopyrrolate     (Robinal) 0.2 mg IV, 0.6cc simethancone 0.6 cc PO     TOPICAL ANESTHETIC:  Exactacain Spray           DESCRIPTION OF PROCEDURE:   After the risks benefits and     alternatives of the procedure were thoroughly explained, informed     consent was obtained.  The LB GIF-H180 [id number] endoscope was     introduced through the mouth and advanced to the third portion of     the duodenum, without limitations.  The instrument was slowly     withdrawn as the mucosa was fully examined.     <<PROCEDUREIMAGES>>           A stricture was found at the gastroesophageal junction (see     image1). Moderate stricture Dilation with maloney dilator 49m     Moderate resistance; minimal heme  Esophagitis was found at the     gastroesophageal junction. Grade B erosive esophagitis (see     image6). Bxs taken to r/o Barrett's esophagus  Otherwise the     examination was normal (see image2, image3, image4, and image5).     Retroflexed views revealed no abnormalities.    The scope was then     withdrawn from the patient and the procedure completed.           COMPLICATIONS:  None           ENDOSCOPIC IMPRESSION:     1) Stricture at the gastroesophageal junction - s/p maloney     dilitation     2) Esophagitis at the  gastroesophageal junction     3) Otherwise normal examination     RECOMMENDATIONS:     1) dilatations PRN     2) Await biopsy results     3) Prilosec 20 mg po q am           REPEAT EXAM:  Dr. KDeatra Inawill contact you after reviewing the     pathology with followup recommendations           ______________________________     RSandy Salaam KDeatra Ina MD           CC:  JRicard Dillon MD           n.     Jason Summers   RSandy Salaam Rolen Conger at 07/25/2010 08:33 AM  Jason Summers, Jason Summers, [id number]  Note: An exclamation mark (!) indicates a result that was not dispersed into the flowsheet. Document Creation Date: 07/25/2010 8:34 AM _______________________________________________________________________  (1) Order result status: Final Collection or observation date-time: 07/25/2010 08:24 Requested date-time:  Receipt date-time:  Reported date-time:  Referring Physician:   Ordering Physician: Erskine Emery [phone]) Specimen Source:  Source: Tawanna Cooler Order Number: [id number] Lab site:

## 2010-08-01 ENCOUNTER — Encounter: Payer: Self-pay | Admitting: Gastroenterology

## 2010-08-06 NOTE — Letter (Addendum)
Summary: Results Letter  [facility]  [place], [place] [address]   Phone: [phone]  Fax: [phone]        August 01, 2010 MRN: [id number]    Jason Summers [address] [place], [place]  [address]    Dear [name],   Your biopsies demonstrated inflammatory changes only.    Please follow the recommendations previously discussed.  Should you have any immediate concerns or questions, feel free to contact me at the office.    Sincerely,  Sandy Salaam. Deatra Ina, M.D., [facility]          Sincerely,  [name] MD  This letter has been electronically signed by your physician.  Appended Document: Results Letter letter mailed

## 2010-08-06 NOTE — Letter (Signed)
Summary: Results Letter  [facility]  [place], [place] [address]   Phone: [phone]  Fax: [phone]        July 31, 2010 MRN: [id number]    Jason Summers [address] [place], [place]  [address]    Dear [name],   Your biopsies demonstrated inflammatory changes only.    Please follow the recommendations previously discussed.  Should you have any immediate concerns or questions, feel free to contact me at the office.    Sincerely,  Sandy Salaam. Deatra Ina, M.D., [facility]          Sincerely,  [name] MD  This letter has been electronically signed by your physician.

## 2010-10-18 ENCOUNTER — Encounter: Payer: Self-pay | Admitting: Internal Medicine

## 2010-10-24 ENCOUNTER — Encounter: Payer: Self-pay | Admitting: Internal Medicine

## 2010-10-24 ENCOUNTER — Ambulatory Visit (INDEPENDENT_AMBULATORY_CARE_PROVIDER_SITE_OTHER): Payer: Medicare Other | Admitting: Internal Medicine

## 2010-10-24 VITALS — BP 124/80 | HR 68 | Temp 98.2°F | Resp 14 | Ht 71.0 in | Wt 170.0 lb

## 2010-10-24 DIAGNOSIS — E785 Hyperlipidemia, unspecified: Secondary | ICD-10-CM

## 2010-10-24 DIAGNOSIS — K219 Gastro-esophageal reflux disease without esophagitis: Secondary | ICD-10-CM

## 2010-10-24 DIAGNOSIS — I10 Essential (primary) hypertension: Secondary | ICD-10-CM

## 2010-10-24 DIAGNOSIS — M1990 Unspecified osteoarthritis, unspecified site: Secondary | ICD-10-CM

## 2010-10-24 NOTE — Progress Notes (Signed)
  Subjective:    Patient ID: Jason Summers, male    DOB: 06-26-1931, 77 y.o.   MRN: [id number]  HPI patient is a 77 year old white male who presents for followup of hyperlipidemia hypertension weight and recent stricture which was treated by gastroenterology.  Patient has a history of hyperlipidemia which has been mild his blood pressures but normal on diet and exercise and limitation of cheese and salt.  He had recent esophagitis and dysphagia which was treated by gastroenterology.      Review of Systems  Constitutional: Negative for fever and fatigue.  HENT: Negative for hearing loss, congestion, neck pain and postnasal drip.   Eyes: Negative for discharge, redness and visual disturbance.  Respiratory: Negative for cough, shortness of breath and wheezing.   Cardiovascular: Negative for leg swelling.  Gastrointestinal: Negative for abdominal pain, constipation and abdominal distention.  Genitourinary: Negative for urgency and frequency.  Musculoskeletal: Negative for joint swelling and arthralgias.  Skin: Negative for color change and rash.  Neurological: Negative for weakness and light-headedness.  Hematological: Negative for adenopathy.  Psychiatric/Behavioral: Negative for behavioral problems.       Past Medical History  Diagnosis Date  . Arthritis   . Spondylosis of cervical joint   . Lateral epicondylitis (tennis elbow)   . Hemorrhoids    Past Surgical History  Procedure Date  . Colonoscopy 2003    reports that he has been smoking.  He does not have any smokeless tobacco history on file. He reports that he does not drink alcohol or use illicit drugs. family history includes Coronary artery disease in an unspecified family member; Heart disease in his mother; and Hypertension in his mother. No Known Allergies  Objective:   Physical Exam  Constitutional: He appears well-developed and well-nourished.  HENT:  Head: Normocephalic and atraumatic.  Eyes: Conjunctivae are  normal. Pupils are equal, round, and reactive to light.  Neck: Normal range of motion. Neck supple.  Cardiovascular: Normal rate and regular rhythm.   Pulmonary/Chest: Effort normal and breath sounds normal.  Abdominal: Soft. Bowel sounds are normal.          Assessment & Plan:  Reviewed blood work including his lipid levels he has a decreased HDL level total cholesterol is approximately 207 LDL is 127.  The kidneys official diet and exercise his primary interventions there is marked longevity in his family.  His esophagitis and esophageal stricture had been treated he no longer experiences dysphagia and is on no PPI prophylaxis.  His blood pressure stable.

## 2010-10-25 NOTE — Cardiovascular Report (Signed)
[place]  Patient:    Jason Summers, Jason Summers                          MRN: [id number] Proc. Date: 10/07/99 Adm. Date:  [id number] Attending:  Christy Sartorius CC:         Christy Sartorius, M.D. [facility]             Ricard Dillon, M.D. [facility]                        Cardiac Catheterization  PROCEDURES PERFORMED: Coronary arteriography.  INDICATIONS:  Chest pain and exertional dyspnea with positive exercise treadmill test.  DESCRIPTION OF PROCEDURE:  The patient had small coronary arteries.  Both the JL-4 and JL-3.5 catheters were used for the left system.  RESULTS:  CORONARY ARTERIES: 1. There was a 30% discrete distal left main stenosis.  2. The left anterior descending artery had 20% multiple lesions proximally.    The mid vessel had 30% to 40% tubular lesions.  The distal vessel was    normal size and disease.  3. The first diagonal branch had 20% multiple discrete lesions.  The second    diagonal branch had 20% multiple discrete lesions.  The third diagonal    branch was very small and diffusely diseased.  4. The circumflex coronary artery was very small.  There was 30% to 40%    discrete disease in the mid portion of the obtuse marginal branch.  5. The right coronary artery was very difficult to engage.  We used a JR-4,    JR-3.5, and ______ catheters.  There was consistent damping due to the    upward takeoff of the artery.  Overall, the artery appeared normal except    for a hypodense area in the very proximal portion of the artery.  This was    most consistent with catheter tip spasm.  Unfortunately, we were unable to keep a stable enough catheter position without damping to inject IC nitroglycerin.  The films were reviewed with Dr. Olevia Perches, who agreed that this most likely represented catheter spasm and not a discrete focal lesion.  RAO VENTRICULOGRAPHY:  RAO ventriculography was normal.  Ejection fraction was in excess of 65%.  There was no gradient  across the aortic valve and no MR.  HEMODYNAMICS:  Aortic pressure was 127/78.  LV pressure was 127/14.  The right femoral artery was closed at the end of the case with the Perclose device.  There was excellent hemostasis.  IMPRESSION:  Jason Summers primary symptoms have been exertional dyspnea.  He did have one episode of chest pain at rest.  His coronary arteries do not show any critical lesions.  The proximal right coronary artery most likely represented some degree of catheter tip spasm.  Since the patient is traveling to [place], we will follow up with a Persantine Cardiolite study this week to make sure there is not a critical stenosis in the proximal right coronary artery.  Again, the films were reviewed with Dr. Olevia Perches and will also be looked at by Dr. Lyndel Safe.  The patients exertional dyspnea should be noncardiac in etiology since he has totally normal left ventricular function and a low left ventricular end-diastolic pressure.  Overall, his prognosis would appear to be excellent. DD:  10/07/99 TD:  10/07/99 Job: [id number]

## 2011-03-13 ENCOUNTER — Other Ambulatory Visit: Payer: Self-pay | Admitting: Dermatology

## 2011-03-19 ENCOUNTER — Encounter: Payer: Self-pay | Admitting: Gastroenterology

## 2011-03-19 ENCOUNTER — Ambulatory Visit (INDEPENDENT_AMBULATORY_CARE_PROVIDER_SITE_OTHER): Payer: Medicare Other | Admitting: Gastroenterology

## 2011-03-19 VITALS — BP 132/74 | HR 68 | Ht 71.0 in | Wt 168.0 lb

## 2011-03-19 DIAGNOSIS — R1319 Other dysphagia: Secondary | ICD-10-CM

## 2011-03-19 DIAGNOSIS — Z23 Encounter for immunization: Secondary | ICD-10-CM

## 2011-03-19 NOTE — Patient Instructions (Addendum)
Upper GI Endoscopy Upper GI endoscopy means using a flexible scope to look at the esophagus, stomach and upper small bowel. This is done to make a diagnosis in people with heartburn, abdominal pain, or abnormal bleeding. Sometimes an endoscope is needed to remove foreign bodies or food that become stuck in the esophagus; it can also be used to take biopsy samples. For the best results, do not eat or drink for 8 hours before having your upper endoscopy.  To perform the endoscopy, you will probably be sedated and your throat will be numbed with a special spray. The endoscope is then slowly passed down your throat (this will not interfere with your breathing). An endoscopy exam takes 15-30 minutes to complete and there is no real pain. Patients rarely remember much about the procedure. The results of the test may take several days if a biopsy or other test is taken.  You may have a sore throat after an endoscopy exam. Serious complications are very rare. Stick to liquids and soft foods until your pain is better. You should not drive a car or operate any dangerous equipment for at least 24 hours after being sedated. SEEK IMMEDIATE MEDICAL CARE IF:  You have severe throat pain.   You have shortness of breath.   You have bleeding problems.   You have a fever.   You have difficulty recovering from your sedation.  Document Released: 07/03/2004 Document Re-Released: 08/20/2009 [facility] Patient Information 2011 [place]. You have been given a flu shot today

## 2011-03-19 NOTE — Progress Notes (Signed)
History of Present Illness:  Jason Summers has returned again complaining of dysphagia to solids. In January, 2012 he underwent upper endoscopy with dilatation of  distal esophageal stricture. He was significantly improved but gradually has developed increasing dysphagia to solids again.    Review of Systems: Pertinent positive and negative review of systems were noted in the above HPI section. All other review of systems were otherwise negative.    Current Medications, Allergies, Past Medical History, Past Surgical History, Family History and Social History were reviewed in [facility] record  Vital signs were reviewed in today's medical record. Physical Exam: General: Well developed , well nourished, no acute distress

## 2011-03-19 NOTE — Assessment & Plan Note (Signed)
This is undoubtedly due to the recurrent stricture.  Recommendations #1 repeat upper endoscopy with dilatation as indicated

## 2011-03-21 ENCOUNTER — Encounter: Payer: Self-pay | Admitting: Gastroenterology

## 2011-03-21 ENCOUNTER — Ambulatory Visit (AMBULATORY_SURGERY_CENTER): Payer: Medicare Other | Admitting: Gastroenterology

## 2011-03-21 DIAGNOSIS — R1310 Dysphagia, unspecified: Secondary | ICD-10-CM

## 2011-03-21 DIAGNOSIS — K222 Esophageal obstruction: Secondary | ICD-10-CM

## 2011-03-21 MED ORDER — OMEPRAZOLE 20 MG PO CPDR: 20.0000 mg | DELAYED_RELEASE_CAPSULE | Freq: Every day | ORAL | Status: DC

## 2011-03-21 MED ORDER — SODIUM CHLORIDE 0.9 % IV SOLN: 500.0000 mL | INTRAVENOUS | Status: DC

## 2011-03-21 NOTE — Patient Instructions (Addendum)
Esophageal Stricture (Narrowing) The esophagus is the long, narrow tube which carries food and liquid from the mouth to the stomach. Sometimes a part of the esophagus becomes narrow and makes it difficult, painful, or even impossible to swallow. This is called an esophageal stricture.  SYMPTOMS Some of the problems are difficulty swallowing or pain with swallowing. CAUSES Common causes of blockage or strictures of the esophagus are:  Exposure of the lower esophagus to the acid from the stomach may cause narrowing.   Hiatal hernia in which a small part of the stomach bulges up through the diaphragm can cause a narrowing in the bottom of the esophagus.   Scleroderma is a tissue disorder that affects the esophagus and makes swallowing difficult.   Achalasia is an absence of nerves in the lower esophagus and to the esophageal sphincter. This absence of nerves may be congenital (present since birth). This can cause irregular spasms which do not allow food and fluid through.   Strictures may develop from swallowing materials which damage the esophagus. Examples are acids or alkalis such as lye.   Schatzki's Ring is a narrow ring of non-cancerous tissue which narrows the lower esophagus. The cause of this is unknown.   Growths can block the esophagus.  DIAGNOSIS Your caregiver often suspects this problem by taking a medical history. They will also do a physical exam. They may then take X-rays and/or perform an endoscopy. Endoscopy is an exam in which a tube like a small flexible telescope is used to look at your esophagus.  TREATMENT AND PROCEDURE  One form of treatment is to dilate the narrow area. This means to stretch it.   When this is not successful, chest surgery may be required. This is a much more extensive form of treatment with a longer recovery time.  Both of the above treatments make the passage of food and water into the stomach easier. They also make it easier for stomach contents  to bubble back into the esophagus. Special medications may be used following the procedure to help prevent further narrowing. Medications may be used to lower the amount of acid in the stomach juice.  SEEK IMMEDIATE MEDICAL CARE IF:  Your swallowing is becoming more painful, difficult, or you are unable to swallow.   You vomit up blood.   You develop black tarry stools.   You develop chills or an unexplained fever of over 101 F (38.3 C).   You develop chest or abdominal pain.   You develop shortness of breath, feel lightheaded, or faint.  Follow up with medical care as your caregiver suggests. Document Released: 02/03/2006 Document Re-Released: 03/23/2007 [facility] Patient Information 2011 [place].  Please refer to your neon green sheet for instructions regarding activity for the rest of today.  You may resume your medications as you would normally take them.   Please follow the ESOPHAGEAL DILATION DIET as follows:  -Nothing by mouth until 1000am  -Clear Liquids from 1000am-1100am  -Soft foods for the rest of today 1100am until tomorrow.

## 2011-03-24 ENCOUNTER — Telehealth: Payer: Self-pay | Admitting: *Deleted

## 2011-03-24 NOTE — Telephone Encounter (Signed)

## 2011-04-14 ENCOUNTER — Telehealth: Payer: Self-pay | Admitting: Internal Medicine

## 2011-04-14 NOTE — Telephone Encounter (Signed)
Pts has decided to keep his original emp on 06/27/11 at 9:30am. Pt did not want 11/16 ov.

## 2011-04-14 NOTE — Telephone Encounter (Signed)
There is an opening 11-16-next Friday --I put him in so the space wouldn't be used- please call and schedule- He can eat a very light breakfast that am and then nothing until after physical

## 2011-04-14 NOTE — Telephone Encounter (Signed)
Pt was sch for emp in 05/09/11, but has to rsc because pcp was not going to be avail. He was then resch to 06/2011, but pt says that he has to get his emp done before the end of 2012. Pt req a work in fasting emp before the end of the year.

## 2011-04-17 ENCOUNTER — Other Ambulatory Visit: Payer: Self-pay | Admitting: Dermatology

## 2011-04-25 ENCOUNTER — Encounter: Payer: Medicare Other | Admitting: Internal Medicine

## 2011-05-09 ENCOUNTER — Encounter: Payer: PRIVATE HEALTH INSURANCE | Admitting: Internal Medicine

## 2011-06-27 ENCOUNTER — Encounter: Payer: PRIVATE HEALTH INSURANCE | Admitting: Internal Medicine

## 2011-08-04 ENCOUNTER — Ambulatory Visit: Payer: Medicare Other | Admitting: Internal Medicine

## 2011-08-21 ENCOUNTER — Ambulatory Visit (INDEPENDENT_AMBULATORY_CARE_PROVIDER_SITE_OTHER): Payer: Medicare Other | Admitting: Internal Medicine

## 2011-08-21 VITALS — BP 126/80 | HR 72 | Temp 98.3°F | Resp 16 | Ht 72.0 in | Wt 170.0 lb

## 2011-08-21 DIAGNOSIS — T887XXA Unspecified adverse effect of drug or medicament, initial encounter: Secondary | ICD-10-CM | POA: Diagnosis not present

## 2011-08-21 DIAGNOSIS — N139 Obstructive and reflux uropathy, unspecified: Secondary | ICD-10-CM

## 2011-08-21 DIAGNOSIS — N401 Enlarged prostate with lower urinary tract symptoms: Secondary | ICD-10-CM | POA: Diagnosis not present

## 2011-08-21 DIAGNOSIS — Z0000 Encounter for general adult medical examination without abnormal findings: Secondary | ICD-10-CM | POA: Diagnosis not present

## 2011-08-21 DIAGNOSIS — E785 Hyperlipidemia, unspecified: Secondary | ICD-10-CM

## 2011-08-21 DIAGNOSIS — N138 Other obstructive and reflux uropathy: Secondary | ICD-10-CM

## 2011-08-21 LAB — BASIC METABOLIC PANEL
BUN: 15 mg/dL (ref 6–23)
Calcium: 8.8 mg/dL (ref 8.4–10.5)
GFR: 81.56 mL/min (ref >60.00)
Glucose, Bld: 100 mg/dL — ABNORMAL HIGH (ref 70–99)
Potassium: 4.3 mEq/L (ref 3.5–5.1)

## 2011-08-21 LAB — CBC WITH DIFFERENTIAL/PLATELET
Basophils Absolute: 0 10*3/uL (ref 0.0–0.1)
Basophils Relative: 0.4 % (ref 0.0–3.0)
Eosinophils Absolute: 0.1 10*3/uL (ref 0.0–0.7)
Lymphocytes Relative: 30.1 % (ref 12.0–46.0)
MCHC: 33.2 g/dL (ref 30.0–36.0)
MCV: 91.4 fl (ref 78.0–100.0)
Monocytes Absolute: 0.6 10*3/uL (ref 0.1–1.0)
Neutrophils Relative %: 57.4 % (ref 43.0–77.0)
Platelets: 179 10*3/uL (ref 150.0–400.0)
RBC: 4.84 Mil/uL (ref 4.22–5.81)
RDW: 13.9 % (ref 11.5–14.6)

## 2011-08-21 LAB — POCT URINALYSIS DIPSTICK
Bilirubin, UA: NEGATIVE
Blood, UA: NEGATIVE
Glucose, UA: NEGATIVE
Leukocytes, UA: NEGATIVE
Nitrite, UA: NEGATIVE
Urobilinogen, UA: 0.2

## 2011-08-21 LAB — HEPATIC FUNCTION PANEL
Albumin: 4.1 g/dL (ref 3.5–5.2)
Alkaline Phosphatase: 56 U/L (ref 39–117)
Bilirubin, Direct: 0.1 mg/dL (ref 0.0–0.3)
Total Protein: 6.9 g/dL (ref 6.0–8.3)

## 2011-08-21 LAB — PSA: PSA: 1.99 ng/mL (ref 0.10–4.00)

## 2011-08-21 LAB — LIPID PANEL
HDL: 54.1 mg/dL (ref >39.00)
Triglycerides: 91 mg/dL (ref 0.0–149.0)
VLDL: 18.2 mg/dL (ref 0.0–40.0)

## 2011-08-21 NOTE — Patient Instructions (Signed)
The patient is instructed to continue all medications as prescribed. Schedule followup with check out clerk upon leaving the clinic  

## 2011-08-21 NOTE — Progress Notes (Signed)
Subjective:    Patient ID: Jason Summers, male    DOB: [birth year], 78 y.o.   MRN: [id number]  HPI Patient is a 78 year old white male who presents for followup of hyperlipidemia and also for a Medicare wellness examination today.  He has a history of GERD with resultant dysphasia and will require a CBC and liver functions today he has a history of mild hyperlipidemia and will require a lipid screening panel today. His lipids are treated with omega-3 fatty acids.  His GERD is treated with Prilosec 20 mg by mouth daily he is also taking glucosamine/chondroitin for mild to moderate osteoarthritis    Review of Systems  Constitutional: Negative for fever and fatigue.  HENT: Negative for hearing loss, congestion, neck pain and postnasal drip.   Eyes: Negative for discharge, redness and visual disturbance.  Respiratory: Negative for cough, shortness of breath and wheezing.   Cardiovascular: Negative for leg swelling.  Gastrointestinal: Negative for abdominal pain, constipation and abdominal distention.  Genitourinary: Negative for urgency and frequency.  Musculoskeletal: Negative for joint swelling and arthralgias.  Skin: Negative for color change and rash.  Neurological: Negative for weakness and light-headedness.  Hematological: Negative for adenopathy.  Psychiatric/Behavioral: Negative for behavioral problems.   Past Medical History  Diagnosis Date  . Arthritis   . Spondylosis of cervical joint   . Lateral epicondylitis (tennis elbow)   . Hemorrhoids     History   Social History  . Marital Status: Married    Spouse Name: N/A    Number of Children: N/A  . Years of Education: N/A   Occupational History  . Retired     Social History Main Topics  . Smoking status: Current Everyday Smoker  . Smokeless tobacco: Never Used   Comment: smokes 1 ciagettes occasionally  . Alcohol Use: 4.2 oz/week    7 Glasses of wine per week  . Drug Use: No  . Sexually Active: Yes   Other  Topics Concern  . Not on file   Social History Narrative  . No narrative on file    Past Surgical History  Procedure Date  . Colonoscopy 2003    Family History  Problem Relation Age of Onset  . Coronary artery disease    . Hypertension Mother   . Heart disease Mother     No Known Allergies  Current Outpatient Prescriptions on File Prior to Visit  Medication Sig Dispense Refill  . aspirin 81 MG EC tablet Take 81 mg by mouth daily.        . fish oil-omega-3 fatty acids 1000 MG capsule Take 2 g by mouth 2 (two) times daily.        Marland Kitchen glucosamine-chondroitin 500-400 MG tablet Take 1 tablet by mouth 2 (two) times daily.        Marland Kitchen omeprazole (PRILOSEC) 20 MG capsule Take 1 capsule (20 mg total) by mouth daily.  60 capsule  1    BP 126/80  Pulse 72  Temp 98.3 F (36.8 C)  Resp 16  Ht 6' (1.829 m)  Wt 170 lb (77.111 kg)  BMI 23.06 kg/m2       Objective:   Physical Exam  Nursing note and vitals reviewed. Constitutional: He is oriented to person, place, and time. He appears well-developed and well-nourished.  HENT:  Head: Normocephalic and atraumatic.  Eyes: Conjunctivae are normal. Pupils are equal, round, and reactive to light.  Neck: Normal range of motion. Neck supple.  Cardiovascular: Normal rate and regular rhythm.  Pulmonary/Chest: Effort normal and breath sounds normal.  Abdominal: Soft. Bowel sounds are normal.  Genitourinary: Rectum normal and prostate normal.  Musculoskeletal: Normal range of motion. He exhibits tenderness.  Neurological: He is alert and oriented to person, place, and time.          Assessment & Plan:  We will obtain a screening PSA due to mild prostatic hypertrophy.  A lipid will be drawn today to manage his omega-3 treatment for his mild hyperlipidemia.  He is to continue an aspirin daily and was to check a CBC due to the potential risks of aspirin therapy he is also on Prilosec for persistent GERD with a history of dysplasia and he  takes glucosamine/chondroitin for joint pain all of his problems are stable and monitored Subjective:    Jason Summers is a 78 y.o. male who presents for Medicare Annual/Subsequent preventive examination.   Preventive Screening-Counseling & Management  Tobacco History  Smoking status  . Current Everyday Smoker  Smokeless tobacco  . Never Used  Comment: smokes 1 ciagettes occasionally    Problems Prior to Visit 1.   Current Problems (verified) Patient Active Problem List  Diagnoses  . HYPERCHOLESTEROLEMIA, MILD  . OTHER ACUTE REACTIONS TO STRESS  . HEMORRHOIDS, INTERNAL  . OSTEOARTHRITIS  . BURSITIS, SUBDELTOID  . GANGLION OF JOINT  . FACIAL FLUSHING  . DYSPHAGIA, PHARYNGOESOPHAGEAL PHASE  . SPRAIN AND STRAIN OF SACROILIAC  . DYSPHAGIA    Medications Prior to Visit Current Outpatient Prescriptions on File Prior to Visit  Medication Sig Dispense Refill  . aspirin 81 MG EC tablet Take 81 mg by mouth daily.        . fish oil-omega-3 fatty acids 1000 MG capsule Take 2 g by mouth 2 (two) times daily.        Marland Kitchen glucosamine-chondroitin 500-400 MG tablet Take 1 tablet by mouth 2 (two) times daily.        Marland Kitchen omeprazole (PRILOSEC) 20 MG capsule Take 1 capsule (20 mg total) by mouth daily.  60 capsule  1    Current Medications (verified) Current Outpatient Prescriptions  Medication Sig Dispense Refill  . aspirin 81 MG EC tablet Take 81 mg by mouth daily.        . fish oil-omega-3 fatty acids 1000 MG capsule Take 2 g by mouth 2 (two) times daily.        Marland Kitchen glucosamine-chondroitin 500-400 MG tablet Take 1 tablet by mouth 2 (two) times daily.        Marland Kitchen omeprazole (PRILOSEC) 20 MG capsule Take 1 capsule (20 mg total) by mouth daily.  60 capsule  1     Allergies (verified) Review of patient's allergies indicates no known allergies.   PAST HISTORY  Family History Family History  Problem Relation Age of Onset  . Coronary artery disease    . Hypertension Mother   . Heart disease  Mother     Social History History  Substance Use Topics  . Smoking status: Current Everyday Smoker  . Smokeless tobacco: Never Used   Comment: smokes 1 ciagettes occasionally  . Alcohol Use: 4.2 oz/week    7 Glasses of wine per week    Are there smokers in your home (other than you)?  No  Risk Factors Current exercise habits: Home exercise routine includes calisthenics, stretching and treadmill.  Dietary issues discussed: none   Cardiac risk factors: advanced age (older than 8 for men, 27 for women), dyslipidemia and male gender.  Depression Screen (Note: if  answer to either of the following is "Yes", a more complete depression screening is indicated)   Q1: Over the past two weeks, have you felt down, depressed or hopeless? No  Q2: Over the past two weeks, have you felt little interest or pleasure in doing things? No  Have you lost interest or pleasure in daily life? No  Do you often feel hopeless? No  Do you cry easily over simple problems? No  Activities of Daily Living In your present state of health, do you have any difficulty performing the following activities?:  Driving? No Managing money?  No Feeding yourself? No Getting from bed to chair? No Climbing a flight of stairs? No Preparing food and eating?: No Bathing or showering? No Getting dressed: No Getting to the toilet? No Using the toilet:No Moving around from place to place: No In the past year have you fallen or had a near fall?:No   Are you sexually active?  Yes  Do you have more than one partner?  No  Hearing Difficulties: No Do you often ask people to speak up or repeat themselves? No Do you experience ringing or noises in your ears? No Do you have difficulty understanding soft or whispered voices? No   Do you feel that you have a problem with memory? No  Do you often misplace items? No  Do you feel safe at home?  Yes  Cognitive Testing  Alert? Yes  Normal Appearance?Yes  Oriented to person?  Yes  Place? Yes   Time? Yes  Recall of three objects?  Yes  Can perform simple calculations? Yes  Displays appropriate judgment?Yes  Can read the correct time from a watch face?Yes   Advanced Directives have been discussed with the patient? Yes   List the Names of Other Physician/Practitioners you currently use: 1.    Indicate any recent Medical Services you may have received from other than [facility] providers in the past year (date may be approximate).  Immunization History  Administered Date(s) Administered  . Influenza Split 03/19/2011  . Influenza Whole 06/09/1997, 04/22/2007, 03/14/2008, 03/28/2009, 05/09/2010  . Pneumococcal Polysaccharide 06/09/2004  . Td 06/09/2005    Screening Tests Health Maintenance  Topic Date Due  . Zostavax  12/05/1993  . Colonoscopy  06/15/2011  . Influenza Vaccine  03/09/2012  . Tetanus/tdap  06/10/2015  . Pneumococcal Polysaccharide Vaccine Age 2 And Over  Completed    All answers were reviewed with the patient and necessary referrals were made:  Georgetta Haber, MD   09/02/2011   History reviewed: allergies, current medications, past family history, past medical history, past social history, past surgical history and problem list  Review of Systems A comprehensive review of systems was negative.    Objective:     Vision by Snellen chart: right eye:20/20, left eye:20/20 Blood pressure 126/80, pulse 72, temperature 98.3 F (36.8 C), resp. rate 16, height 6' (1.829 m), weight 170 lb (77.111 kg). Body mass index is 23.06 kg/(m^2).  BP 126/80  Pulse 72  Temp 98.3 F (36.8 C)  Resp 16  Ht 6' (1.829 m)  Wt 170 lb (77.111 kg)  BMI 23.06 kg/m2  General Appearance:    Alert, cooperative, no distress, appears stated age  Head:    Normocephalic, without obvious abnormality, atraumatic  Eyes:    PERRL, conjunctiva/corneas clear, EOM's intact, fundi    benign, both eyes       Ears:    Normal TM's and external ear canals, both ears  Nose:  Nares normal, septum midline, mucosa normal, no drainage    or sinus tenderness  Throat:   Lips, mucosa, and tongue normal; teeth and gums normal  Neck:   Supple, symmetrical, trachea midline, no adenopathy;       thyroid:  No enlargement/tenderness/nodules; no carotid   bruit or JVD  Back:     Symmetric, no curvature, ROM normal, no CVA tenderness  Lungs:     Clear to auscultation bilaterally, respirations unlabored  Chest wall:    No tenderness or deformity  Heart:    Regular rate and rhythm, S1 and S2 normal, no murmur, rub   or gallop  Abdomen:     Soft, non-tender, bowel sounds active all four quadrants,    no masses, no organomegaly  Genitalia:    Normal male without lesion, discharge or tenderness  Rectal:    Normal tone, normal prostate, no masses or tenderness;   guaiac negative stool  Extremities:   Extremities normal, atraumatic, no cyanosis or edema  Pulses:   2+ and symmetric all extremities  Skin:   Skin color, texture, turgor normal, no rashes or lesions  Lymph nodes:   Cervical, supraclavicular, and axillary nodes normal  Neurologic:   CNII-XII intact. Normal strength, sensation and reflexes      throughout       Assessment:      Patient presents for yearly preventative medicine examination.   all immunizations and health maintenance protocols were reviewed with the patient and they are up to date with these protocols.   screening laboratory values were reviewed with the patient including screening of hyperlipidemia PSA renal function and hepatic function.   There medications past medical history social history problem list and allergies were reviewed in detail.   Goals were established with regard to weight loss exercise diet in compliance with medications      Plan:     During the course of the visit the patient was educated and counseled about appropriate screening and preventive services including:    Influenza vaccine  Prostate cancer  screening  Diet review for nutrition referral? Yes ____  Not Indicated __x__   Patient Instructions (the written plan) was given to the patient.  Medicare Attestation I have personally reviewed: The patient's medical and social history Their use of alcohol, tobacco or illicit drugs Their current medications and supplements The patient's functional ability including ADLs,fall risks, home safety risks, cognitive, and hearing and visual impairment Diet and physical activities Evidence for depression or mood disorders  The patient's weight, height, BMI, and visual acuity have been recorded in the chart.  I have made referrals, counseling, and provided education to the patient based on review of the above and I have provided the patient with a written personalized care plan for preventive services.     Georgetta Haber, MD   09/02/2011

## 2011-08-22 ENCOUNTER — Encounter: Payer: Self-pay | Admitting: Gastroenterology

## 2011-09-02 ENCOUNTER — Encounter: Payer: Self-pay | Admitting: Internal Medicine

## 2011-09-11 ENCOUNTER — Ambulatory Visit (AMBULATORY_SURGERY_CENTER): Payer: Medicare Other | Admitting: *Deleted

## 2011-09-11 VITALS — Ht 70.0 in | Wt 170.0 lb

## 2011-09-11 DIAGNOSIS — Z1211 Encounter for screening for malignant neoplasm of colon: Secondary | ICD-10-CM

## 2011-09-11 MED ORDER — PEG-KCL-NACL-NASULF-NA ASC-C 100 G PO SOLR: ORAL | Status: DC

## 2011-09-25 ENCOUNTER — Encounter: Payer: Self-pay | Admitting: Gastroenterology

## 2011-09-25 ENCOUNTER — Ambulatory Visit (AMBULATORY_SURGERY_CENTER): Payer: Medicare Other | Admitting: Gastroenterology

## 2011-09-25 VITALS — BP 121/76 | HR 57 | Temp 97.0°F | Resp 18 | Ht 70.0 in | Wt 170.0 lb

## 2011-09-25 DIAGNOSIS — Z1211 Encounter for screening for malignant neoplasm of colon: Secondary | ICD-10-CM | POA: Diagnosis not present

## 2011-09-25 DIAGNOSIS — K5520 Angiodysplasia of colon without hemorrhage: Secondary | ICD-10-CM

## 2011-09-25 DIAGNOSIS — K5730 Diverticulosis of large intestine without perforation or abscess without bleeding: Secondary | ICD-10-CM

## 2011-09-25 MED ORDER — SODIUM CHLORIDE 0.9 % IV SOLN: 500.0000 mL | INTRAVENOUS | Status: DC

## 2011-09-25 NOTE — Patient Instructions (Signed)
Discharge instructions given with verbal understanding. Handouts on diverticulosis and a high fiber diet given. Resume previous medications.YOU HAD AN ENDOSCOPIC PROCEDURE TODAY AT [facility]: Refer to the procedure report that was given to you for any specific questions about what was found during the examination.  If the procedure report does not answer your questions, please call your gastroenterologist to clarify.  If you requested that your care partner not be given the details of your procedure findings, then the procedure report has been included in a sealed envelope for you to review at your convenience later.  YOU SHOULD EXPECT: Some feelings of bloating in the abdomen. Passage of more gas than usual.  Walking can help get rid of the air that was put into your GI tract during the procedure and reduce the bloating. If you had a lower endoscopy (such as a colonoscopy or flexible sigmoidoscopy) you may notice spotting of blood in your stool or on the toilet paper. If you underwent a bowel prep for your procedure, then you may not have a normal bowel movement for a few days.  DIET: Your first meal following the procedure should be a light meal and then it is ok to progress to your normal diet.  A half-sandwich or bowl of soup is an example of a good first meal.  Heavy or fried foods are harder to digest and may make you feel nauseous or bloated.  Likewise meals heavy in dairy and vegetables can cause extra gas to form and this can also increase the bloating.  Drink plenty of fluids but you should avoid alcoholic beverages for 24 hours.  ACTIVITY: Your care partner should take you home directly after the procedure.  You should plan to take it easy, moving slowly for the rest of the day.  You can resume normal activity the day after the procedure however you should NOT DRIVE or use heavy machinery for 24 hours (because of the sedation medicines used during the test).    SYMPTOMS TO  REPORT IMMEDIATELY: A gastroenterologist can be reached at any hour.  During normal business hours, 8:30 AM to 5:00 PM Monday through Friday, call [phone].  After hours and on weekends, please call the GI answering service at [phone] who will take a message and have the physician on call contact you.   Following lower endoscopy (colonoscopy or flexible sigmoidoscopy):  Excessive amounts of blood in the stool  Significant tenderness or worsening of abdominal pains  Swelling of the abdomen that is new, acute  Fever of 100F or higher  FOLLOW UP: If any biopsies were taken you will be contacted by phone or by letter within the next 1-3 weeks.  Call your gastroenterologist if you have not heard about the biopsies in 3 weeks.  Our staff will call the home number listed on your records the next business day following your procedure to check on you and address any questions or concerns that you may have at that time regarding the information given to you following your procedure. This is a courtesy call and so if there is no answer at the home number and we have not heard from you through the emergency physician on call, we will assume that you have returned to your regular daily activities without incident.  SIGNATURES/CONFIDENTIALITY: You and/or your care partner have signed paperwork which will be entered into your electronic medical record.  These signatures attest to the fact that that the information above on your  After Visit Summary has been reviewed and is understood.  Full responsibility of the confidentiality of this discharge information lies with you and/or your care-partner.

## 2011-09-25 NOTE — Op Note (Signed)
[address] [place], [place]  [id number]  COLONOSCOPY PROCEDURE REPORT  PATIENT:  Jason, Summers  MR#:  [id number] BIRTHDATE:  [birth year], 14 yrs. old  GENDER:  male ENDOSCOPIST:  Sandy Salaam. Deatra Ina, MD REF. BY: PROCEDURE DATE:  09/25/2011 PROCEDURE:  Diagnostic Colonoscopy ASA CLASS:  Class II INDICATIONS:  Routine Risk Screening MEDICATIONS:   MAC sedation, administered by CRNA propofol 134mIV  DESCRIPTION OF PROCEDURE:   After the risks benefits and alternatives of the procedure were thoroughly explained, informed consent was obtained.  Digital rectal exam was performed and revealed no abnormalities.   The LB 180AL [id number] was introduced through the anus and advanced to the cecum, which was identified by both the appendix and ileocecal valve, without limitations.  The quality of the prep was excellent, using MoviPrep.  The instrument was then slowly withdrawn as the colon was fully examined. 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  Mild diverticulosis was found in the sigmoid colon (see image10).  This was otherwise a normal examination of the colon (see image5, image6, and image12). Retroflexed views in the rectum revealed no abnormalities.    The time to cecum =  1) 3.0  minutes. The scope was then withdrawn in 1) 6.50  minutes from the cecum and the procedure completed. COMPLICATIONS:  None ENDOSCOPIC IMPRESSION: 1) AVM's 2) Mild diverticulosis in the sigmoid colon 3) Otherwise normal examination RECOMMENDATIONS: 1) Continue current colorectal screening recommendations for "routine risk" patients with a repeat colonoscopy in 10 years. REPEAT EXAM:  In 10 year(s) for Colonoscopy.  ______________________________ RoSandy SalaamKaDeatra InaMD  CC:  JoRicard DillonMD  n. eSLorrin Mais  RoSandy SalaamKaplan at  09/25/2011 08:58 AM  [place], [phone]

## 2011-09-25 NOTE — Progress Notes (Signed)
Patient did not experience any of the following events: a burn prior to discharge; a fall within the facility; wrong site/side/patient/procedure/implant event; or a hospital transfer or hospital admission upon discharge from the facility. (G8907) Patient did not have preoperative order for IV antibiotic SSI prophylaxis. (G8918)  

## 2011-09-26 ENCOUNTER — Telehealth: Payer: Self-pay

## 2011-09-26 NOTE — Telephone Encounter (Signed)
  Follow up Call-  Call back number 09/25/2011 03/21/2011  Post procedure Call Back phone  # [phone]  Permission to leave phone message Yes -     Patient questions:  Do you have a fever, pain , or abdominal swelling? no Pain Score  0 *  Have you tolerated food without any problems? yes  Have you been able to return to your normal activities? yes  Do you have any questions about your discharge instructions: Diet   no Medications  no Follow up visit  no  Do you have questions or concerns about your Care? no  Actions: * If pain score is 4 or above: No action needed, pain <4.

## 2011-10-03 DIAGNOSIS — H5200 Hypermetropia, unspecified eye: Secondary | ICD-10-CM | POA: Diagnosis not present

## 2011-10-03 DIAGNOSIS — H01009 Unspecified blepharitis unspecified eye, unspecified eyelid: Secondary | ICD-10-CM | POA: Diagnosis not present

## 2011-10-03 DIAGNOSIS — H2510 Age-related nuclear cataract, unspecified eye: Secondary | ICD-10-CM | POA: Diagnosis not present

## 2011-10-03 DIAGNOSIS — H43819 Vitreous degeneration, unspecified eye: Secondary | ICD-10-CM | POA: Diagnosis not present

## 2011-10-13 DIAGNOSIS — H6120 Impacted cerumen, unspecified ear: Secondary | ICD-10-CM | POA: Diagnosis not present

## 2011-10-13 DIAGNOSIS — H905 Unspecified sensorineural hearing loss: Secondary | ICD-10-CM | POA: Diagnosis not present

## 2011-10-16 ENCOUNTER — Ambulatory Visit: Payer: Medicare Other | Admitting: Internal Medicine

## 2011-11-04 ENCOUNTER — Telehealth: Payer: Self-pay | Admitting: *Deleted

## 2011-11-04 NOTE — Telephone Encounter (Signed)
Wife calling stating pt is very nervous and would like mild sedative to take edge off.aware it will be tomorrow befor is answered

## 2011-11-05 ENCOUNTER — Other Ambulatory Visit: Payer: Self-pay | Admitting: *Deleted

## 2011-11-05 MED ORDER — LORAZEPAM 0.5 MG PO TABS: 0.5000 mg | ORAL_TABLET | Freq: Four times a day (QID) | ORAL | Status: AC | PRN

## 2011-11-05 NOTE — Telephone Encounter (Signed)
Per dr Arnoldo Morale- may have ativan .5 every 6-8 hrs prn #20-pt's wife informed and med called in

## 2011-11-20 DIAGNOSIS — M1710 Unilateral primary osteoarthritis, unspecified knee: Secondary | ICD-10-CM | POA: Diagnosis not present

## 2012-02-06 ENCOUNTER — Other Ambulatory Visit: Payer: Self-pay | Admitting: Dermatology

## 2012-02-06 DIAGNOSIS — L820 Inflamed seborrheic keratosis: Secondary | ICD-10-CM | POA: Diagnosis not present

## 2012-02-06 DIAGNOSIS — L570 Actinic keratosis: Secondary | ICD-10-CM | POA: Diagnosis not present

## 2012-02-06 DIAGNOSIS — D239 Other benign neoplasm of skin, unspecified: Secondary | ICD-10-CM | POA: Diagnosis not present

## 2012-02-06 DIAGNOSIS — D485 Neoplasm of uncertain behavior of skin: Secondary | ICD-10-CM | POA: Diagnosis not present

## 2012-02-06 DIAGNOSIS — Z85828 Personal history of other malignant neoplasm of skin: Secondary | ICD-10-CM | POA: Diagnosis not present

## 2012-03-12 ENCOUNTER — Ambulatory Visit (INDEPENDENT_AMBULATORY_CARE_PROVIDER_SITE_OTHER): Payer: Medicare Other | Admitting: Internal Medicine

## 2012-03-12 ENCOUNTER — Encounter: Payer: Self-pay | Admitting: Internal Medicine

## 2012-03-12 VITALS — BP 120/72 | HR 72 | Temp 98.2°F | Resp 16 | Ht 70.0 in | Wt 170.0 lb

## 2012-03-12 DIAGNOSIS — Z23 Encounter for immunization: Secondary | ICD-10-CM

## 2012-03-12 DIAGNOSIS — M542 Cervicalgia: Secondary | ICD-10-CM

## 2012-03-12 DIAGNOSIS — M1990 Unspecified osteoarthritis, unspecified site: Secondary | ICD-10-CM

## 2012-03-12 MED ORDER — OMEPRAZOLE 20 MG PO CPDR: 20.0000 mg | DELAYED_RELEASE_CAPSULE | Freq: Every day | ORAL | Status: DC

## 2012-03-12 NOTE — Progress Notes (Signed)
  Subjective:    Patient ID: Jason Summers, male    DOB: [birth year], 78 y.o.   MRN: [id number]  HPI  Is a 78 year old male who is in excellent health he presents for routine followup.  He has a history of arthritis and overdid it over the last few weeks doing raking and pressure washing of his driveway.  He now has soreness in his posterior neck that does not radiate down the arms.  He also has a history of dysphagia and GERD is stable on Prilosec 20 mg by mouth daily  Review of Systems  Constitutional: Negative for fever and fatigue.  HENT: Negative for hearing loss, congestion, neck pain and postnasal drip.   Eyes: Negative for discharge, redness and visual disturbance.  Respiratory: Negative for cough, shortness of breath and wheezing.   Cardiovascular: Negative for leg swelling.  Gastrointestinal: Negative for abdominal pain, constipation and abdominal distention.  Genitourinary: Negative for urgency and frequency.  Musculoskeletal: Negative for joint swelling and arthralgias.  Skin: Negative for color change and rash.  Neurological: Negative for weakness and light-headedness.  Hematological: Negative for adenopathy.  Psychiatric/Behavioral: Negative for behavioral problems.       Objective:   Physical Exam  Nursing note reviewed. Constitutional: He appears well-developed and well-nourished.  HENT:  Head: Normocephalic and atraumatic.  Eyes: Conjunctivae normal are normal. Pupils are equal, round, and reactive to light.  Neck: Normal range of motion. Neck supple.  Cardiovascular: Normal rate and regular rhythm.   Pulmonary/Chest: Effort normal and breath sounds normal.  Abdominal: Soft. Bowel sounds are normal.  Skin: Skin is warm and dry.          Assessment & Plan:  Naprelan 750 one by mouth daily for 10 days for his acute neck pain neck exercises and work accommodations discussed.  History of generalized osteoarthritis continued exercise and remembering to be  moderate in the amount of work he takes on on Saturday. Gastroesophageal reflux with a history of dysphagia stable on Prilosec

## 2012-03-31 DIAGNOSIS — H905 Unspecified sensorineural hearing loss: Secondary | ICD-10-CM | POA: Diagnosis not present

## 2012-04-05 ENCOUNTER — Telehealth: Payer: Self-pay | Admitting: Family Medicine

## 2012-04-05 MED ORDER — NAPROXEN SODIUM ER 750 MG PO TB24: 750.0000 mg | ORAL_TABLET | Freq: Every day | ORAL | Status: DC

## 2012-04-05 NOTE — Telephone Encounter (Signed)
Wife calling to let you know that the trial med for husband's shoulder Dr Arnoldo Morale gave him (gave him samples of Naprelan 750), wife would like rx for this med. Wasn't sure of the name. Please send to [facility] on [address]

## 2012-04-05 NOTE — Telephone Encounter (Signed)
Please let pt wife know this was called in-thanks

## 2012-07-14 ENCOUNTER — Other Ambulatory Visit: Payer: Self-pay | Admitting: Dermatology

## 2012-07-14 DIAGNOSIS — C44320 Squamous cell carcinoma of skin of unspecified parts of face: Secondary | ICD-10-CM | POA: Diagnosis not present

## 2012-07-14 DIAGNOSIS — L570 Actinic keratosis: Secondary | ICD-10-CM | POA: Diagnosis not present

## 2012-07-14 DIAGNOSIS — L821 Other seborrheic keratosis: Secondary | ICD-10-CM | POA: Diagnosis not present

## 2012-07-14 DIAGNOSIS — D485 Neoplasm of uncertain behavior of skin: Secondary | ICD-10-CM | POA: Diagnosis not present

## 2012-07-28 DIAGNOSIS — L570 Actinic keratosis: Secondary | ICD-10-CM | POA: Diagnosis not present

## 2012-08-18 ENCOUNTER — Other Ambulatory Visit: Payer: Self-pay | Admitting: Dermatology

## 2012-08-18 DIAGNOSIS — C44320 Squamous cell carcinoma of skin of unspecified parts of face: Secondary | ICD-10-CM | POA: Diagnosis not present

## 2012-08-18 DIAGNOSIS — D485 Neoplasm of uncertain behavior of skin: Secondary | ICD-10-CM | POA: Diagnosis not present

## 2012-08-27 ENCOUNTER — Encounter: Payer: Medicare Other | Admitting: Internal Medicine

## 2012-10-04 ENCOUNTER — Ambulatory Visit (INDEPENDENT_AMBULATORY_CARE_PROVIDER_SITE_OTHER): Payer: Medicare Other | Admitting: Internal Medicine

## 2012-10-04 ENCOUNTER — Telehealth: Payer: Self-pay | Admitting: Internal Medicine

## 2012-10-04 ENCOUNTER — Encounter: Payer: Self-pay | Admitting: Internal Medicine

## 2012-10-04 VITALS — BP 120/78 | HR 72 | Temp 98.6°F | Resp 16 | Ht 70.0 in | Wt 172.0 lb

## 2012-10-04 DIAGNOSIS — Z0000 Encounter for general adult medical examination without abnormal findings: Secondary | ICD-10-CM | POA: Diagnosis not present

## 2012-10-04 DIAGNOSIS — M719 Bursopathy, unspecified: Secondary | ICD-10-CM

## 2012-10-04 DIAGNOSIS — N139 Obstructive and reflux uropathy, unspecified: Secondary | ICD-10-CM | POA: Diagnosis not present

## 2012-10-04 DIAGNOSIS — T887XXA Unspecified adverse effect of drug or medicament, initial encounter: Secondary | ICD-10-CM

## 2012-10-04 DIAGNOSIS — M67919 Unspecified disorder of synovium and tendon, unspecified shoulder: Secondary | ICD-10-CM | POA: Diagnosis not present

## 2012-10-04 DIAGNOSIS — Z23 Encounter for immunization: Secondary | ICD-10-CM

## 2012-10-04 DIAGNOSIS — N401 Enlarged prostate with lower urinary tract symptoms: Secondary | ICD-10-CM | POA: Diagnosis not present

## 2012-10-04 DIAGNOSIS — E785 Hyperlipidemia, unspecified: Secondary | ICD-10-CM | POA: Diagnosis not present

## 2012-10-04 DIAGNOSIS — M7551 Bursitis of right shoulder: Secondary | ICD-10-CM

## 2012-10-04 LAB — HEPATIC FUNCTION PANEL
Albumin: 3.8 g/dL (ref 3.5–5.2)
Total Bilirubin: 0.5 mg/dL (ref 0.3–1.2)

## 2012-10-04 LAB — POCT URINALYSIS DIPSTICK
Bilirubin, UA: NEGATIVE
Blood, UA: NEGATIVE
Ketones, UA: NEGATIVE
Spec Grav, UA: 1.025
pH, UA: 6

## 2012-10-04 LAB — BASIC METABOLIC PANEL
CO2: 27 mEq/L (ref 19–32)
Chloride: 108 mEq/L (ref 96–112)
Potassium: 4.4 mEq/L (ref 3.5–5.1)
Sodium: 140 mEq/L (ref 135–145)

## 2012-10-04 LAB — LIPID PANEL
HDL: 52.7 mg/dL (ref >39.00)
LDL Cholesterol: 115 mg/dL — ABNORMAL HIGH (ref 0–99)
Total CHOL/HDL Ratio: 3
Triglycerides: 63 mg/dL (ref 0.0–149.0)
VLDL: 12.6 mg/dL (ref 0.0–40.0)

## 2012-10-04 LAB — CBC WITH DIFFERENTIAL/PLATELET
Basophils Relative: 0.4 % (ref 0.0–3.0)
Eosinophils Absolute: 0.1 10*3/uL (ref 0.0–0.7)
Hemoglobin: 14.5 g/dL (ref 13.0–17.0)
MCHC: 33.5 g/dL (ref 30.0–36.0)
MCV: 90.6 fl (ref 78.0–100.0)
Monocytes Absolute: 0.7 10*3/uL (ref 0.1–1.0)
Neutro Abs: 4 10*3/uL (ref 1.4–7.7)
RBC: 4.77 Mil/uL (ref 4.22–5.81)

## 2012-10-04 MED ORDER — METHYLPREDNISOLONE ACETATE 40 MG/ML IJ SUSP: 40.0000 mg | Freq: Once | INTRAMUSCULAR | Status: DC

## 2012-10-04 NOTE — Addendum Note (Signed)
Addended by: Allyne Gee on: 10/04/2012 05:56 PM   Modules accepted: Orders

## 2012-10-04 NOTE — Patient Instructions (Addendum)
The patient is instructed to continue all medications as prescribed. Schedule followup with check out clerk upon leaving the clinic  

## 2012-10-04 NOTE — Telephone Encounter (Signed)
Gave to pt

## 2012-10-04 NOTE — Progress Notes (Signed)
Subjective:    Patient ID: Jason Summers, male    DOB: 02-May-1932, 79 y.o.   MRN: [id number]  HPI Who presents for his yearly Medicare wellness examination as well as chronic followup for dysphasia due to GERD and for mild/moderate hyperlipidemia for monitoring effective his drugs he takes and also for bursitis in his right shoulder.  Patient has mild to moderate bursitis in his right shoulder radiates down to the shoulder to the elbow he also has some cervical spondylosis. His pain is intermittent. It is brought on by activity such as raking or vacuuming. And has mild to moderate hyperlipidemia on fish oil supplement. Patient has GERD well controlled with a PPI   Review of Systems  Constitutional: Negative for fever and fatigue.  HENT: Positive for congestion. Negative for hearing loss, neck pain and postnasal drip.   Eyes: Negative for discharge, redness and visual disturbance.  Respiratory: Negative for cough, shortness of breath and wheezing.   Cardiovascular: Negative for leg swelling.  Gastrointestinal: Negative for abdominal pain, constipation and abdominal distention.  Genitourinary: Negative for urgency and frequency.  Musculoskeletal: Positive for myalgias, joint swelling and arthralgias.  Skin: Negative for color change and rash.  Neurological: Negative for weakness and light-headedness.  Hematological: Negative for adenopathy.  Psychiatric/Behavioral: Negative for behavioral problems.   Past Medical History  Diagnosis Date  . Arthritis   . Spondylosis of cervical joint   . Lateral epicondylitis (tennis elbow)   . Hemorrhoids   . Cancer     basal cell on right temple    History   Social History  . Marital Status: Married    Spouse Name: N/A    Number of Children: N/A  . Years of Education: N/A   Occupational History  . Retired     Social History Main Topics  . Smoking status: Current Every Day Smoker    Types: Cigarettes  . Smokeless tobacco: Never Used      Comment: smokes 1 ciagettes occasionally  . Alcohol Use: 4.2 oz/week    7 Glasses of wine per week     Comment: 1-2 glasses of wine a day  . Drug Use: No  . Sexually Active: Yes   Other Topics Concern  . Not on file   Social History Narrative  . No narrative on file    Past Surgical History  Procedure Laterality Date  . Colonoscopy  2003  . Knee arthroscopy  2006    right    Family History  Problem Relation Age of Onset  . Coronary artery disease    . Hypertension Mother   . Heart disease Mother     No Known Allergies  Current Outpatient Prescriptions on File Prior to Visit  Medication Sig Dispense Refill  . aspirin 81 MG EC tablet Take 81 mg by mouth daily.        . fish oil-omega-3 fatty acids 1000 MG capsule Take 2 g by mouth 2 (two) times daily.        Marland Kitchen glucosamine-chondroitin 500-400 MG tablet Take 1 tablet by mouth 2 (two) times daily.        . Naproxen Sodium (NAPRELAN) 750 MG TB24 Take 1 tablet (750 mg total) by mouth daily.  30 each  1  . omeprazole (PRILOSEC) 20 MG capsule Take 1 capsule (20 mg total) by mouth daily.  30 capsule  11   No current facility-administered medications on file prior to visit.    BP 120/78  Pulse 72  Temp(Src) 98.6  F (37 C)  Resp 16  Ht _0  (1.778 m)  Wt 172 lb (78.019 kg)  BMI 24.68 kg/m2        Objective:   Physical Exam  Constitutional: He appears well-developed and well-nourished.  HENT:  Head: Normocephalic and atraumatic.  Eyes: Conjunctivae are normal. Pupils are equal, round, and reactive to light.  Neck: Normal range of motion. Neck supple.  Cardiovascular: Normal rate and regular rhythm.   Pulmonary/Chest: Effort normal and breath sounds normal.  Abdominal: Soft. Bowel sounds are normal.  Genitourinary: Rectum normal and prostate normal.  Musculoskeletal: He exhibits edema and tenderness.  Neurological: No cranial nerve deficit. Coordination normal.  Skin: Skin is warm and dry.  Psychiatric: His  behavior is normal.          Assessment & Plan:   Patient presents for yearly preventative medicine examination.   all immunizations and health maintenance protocols were reviewed with the patient and they are up to date with these protocols.   screening laboratory values were reviewed with the patient including screening of hyperlipidemia PSA renal function and hepatic function.   There medications past medical history social history problem list and allergies were reviewed in detail.   Goals were established with regard to weight loss exercise diet in compliance with medications  Patient has bursitis in the right shoulder.  . Informed consent obtained and the patient's Right anterior shoulder was prepped with betadine. Local anesthesia was obtained with topical spray. Then 40 mg of Depo-Medrol and 1/2 cc of lidocaine was injected into the joint space. The patient tolerated the procedure without complications. Post injection care discussed with patient.   Subjective:    Jason Summers is a 79 y.o. male who presents for Medicare Annual/Subsequent preventive examination.   Preventive Screening-Counseling & Management  Tobacco History  Smoking status  . Current Every Day Smoker  . Types: Cigarettes  Smokeless tobacco  . Never Used    Comment: smokes 1 ciagettes occasionally    Problems Prior to Visit 1.   Current Problems (verified) Patient Active Problem List  Diagnosis  . HYPERCHOLESTEROLEMIA, MILD  . OTHER ACUTE REACTIONS TO STRESS  . HEMORRHOIDS, INTERNAL  . OSTEOARTHRITIS  . BURSITIS, SUBDELTOID  . GANGLION OF JOINT  . FACIAL FLUSHING  . DYSPHAGIA, PHARYNGOESOPHAGEAL PHASE  . SPRAIN AND STRAIN OF SACROILIAC  . DYSPHAGIA    Medications Prior to Visit Current Outpatient Prescriptions on File Prior to Visit  Medication Sig Dispense Refill  . aspirin 81 MG EC tablet Take 81 mg by mouth daily.        . fish oil-omega-3 fatty acids 1000 MG capsule Take 2 g by  mouth 2 (two) times daily.        Marland Kitchen glucosamine-chondroitin 500-400 MG tablet Take 1 tablet by mouth 2 (two) times daily.        . Naproxen Sodium (NAPRELAN) 750 MG TB24 Take 1 tablet (750 mg total) by mouth daily.  30 each  1  . omeprazole (PRILOSEC) 20 MG capsule Take 1 capsule (20 mg total) by mouth daily.  30 capsule  11   No current facility-administered medications on file prior to visit.    Current Medications (verified) Current Outpatient Prescriptions  Medication Sig Dispense Refill  . aspirin 81 MG EC tablet Take 81 mg by mouth daily.        . fish oil-omega-3 fatty acids 1000 MG capsule Take 2 g by mouth 2 (two) times daily.        Marland Kitchen  glucosamine-chondroitin 500-400 MG tablet Take 1 tablet by mouth 2 (two) times daily.        . Naproxen Sodium (NAPRELAN) 750 MG TB24 Take 1 tablet (750 mg total) by mouth daily.  30 each  1  . omeprazole (PRILOSEC) 20 MG capsule Take 1 capsule (20 mg total) by mouth daily.  30 capsule  11   No current facility-administered medications for this visit.     Allergies (verified) Review of patient's allergies indicates no known allergies.   PAST HISTORY  Family History Family History  Problem Relation Age of Onset  . Coronary artery disease    . Hypertension Mother   . Heart disease Mother     Social History History  Substance Use Topics  . Smoking status: Current Every Day Smoker    Types: Cigarettes  . Smokeless tobacco: Never Used     Comment: smokes 1 ciagettes occasionally  . Alcohol Use: 4.2 oz/week    7 Glasses of wine per week     Comment: 1-2 glasses of wine a day    Are there smokers in your home (other than you)?  No  Risk Factors Current exercise habits: The patient does not participate in regular exercise at present.  Dietary issues discussed: stable diet   Cardiac risk factors: advanced age (older than 63 for men, 28 for women), male gender, sedentary lifestyle and smoking/ tobacco exposure.  Depression  Screen (Note: if answer to either of the following is "Yes", a more complete depression screening is indicated)   Q1: Over the past two weeks, have you felt down, depressed or hopeless? No  Q2: Over the past two weeks, have you felt little interest or pleasure in doing things? No  Have you lost interest or pleasure in daily life? No  Do you often feel hopeless? No  Do you cry easily over simple problems? No  Activities of Daily Living In your present state of health, do you have any difficulty performing the following activities?:  Driving? No Managing money?  No Feeding yourself? No Getting from bed to chair? No Climbing a flight of stairs? No Preparing food and eating?: No Bathing or showering? No Getting dressed: No Getting to the toilet? No Using the toilet:No Moving around from place to place: No In the past year have you fallen or had a near fall?:No   Are you sexually active?  Yes  Do you have more than one partner?  No  Hearing Difficulties: No Do you often ask people to speak up or repeat themselves? No Do you experience ringing or noises in your ears? No Do you have difficulty understanding soft or whispered voices? No   Do you feel that you have a problem with memory? No  Do you often misplace items? No  Do you feel safe at home?  Yes  Cognitive Testing  Alert? Yes  Normal Appearance?Yes  Oriented to person? Yes  Place? Yes   Time? Yes  Recall of three objects?  Yes  Can perform simple calculations? Yes  Displays appropriate judgment?Yes  Can read the correct time from a watch face?Yes   Advanced Directives have been discussed with the patient? Yes   List the Names of Other Physician/Practitioners you currently use: 1.    Indicate any recent Medical Services you may have received from other than [facility] providers in the past year (date may be approximate).  Immunization History  Administered Date(s) Administered  . Influenza Split 03/19/2011, 03/12/2012   . Influenza  Whole 06/09/1997, 04/22/2007, 03/14/2008, 03/28/2009, 05/09/2010  . Pneumococcal Polysaccharide 06/09/2004, 10/04/2012  . Td 06/09/2005    Screening Tests Health Maintenance  Topic Date Due  . Zostavax  12/05/1993  . Influenza Vaccine  02/07/2013  . Tetanus/tdap  06/10/2015  . Colonoscopy  09/24/2021  . Pneumococcal Polysaccharide Vaccine Age 81 And Over  Completed    All answers were reviewed with the patient and necessary referrals were made:  Georgetta Haber, MD   10/04/2012   History reviewed: allergies, current medications, past family history, past medical history, past social history, past surgical history and problem list  Review of Systems A comprehensive review of systems was negative.    Objective:     Vision by Snellen chart: right eye:20/20, left eye:20/20 Blood pressure 120/78, pulse 72, temperature 98.6 F (37 C), resp. rate 16, height _0  (1.778 m), weight 172 lb (78.019 kg). Body mass index is 24.68 kg/(m^2).  BP 120/78  Pulse 72  Temp(Src) 98.6 F (37 C)  Resp 16  Ht _1  (1.778 m)  Wt 172 lb (78.019 kg)  BMI 24.68 kg/m2  General Appearance:    Alert, cooperative, no distress, appears stated age  Head:    Normocephalic, without obvious abnormality, atraumatic  Eyes:    PERRL, conjunctiva/corneas clear, EOM's intact, fundi    benign, both eyes       Ears:    Normal TM's and external ear canals, both ears  Nose:   Nares normal, septum midline, mucosa normal, no drainage    or sinus tenderness  Throat:   Lips, mucosa, and tongue normal; teeth and gums normal  Neck:   Supple, symmetrical, trachea midline, no adenopathy;       thyroid:  No enlargement/tenderness/nodules; no carotid   bruit or JVD  Back:     Symmetric, no curvature, ROM normal, no CVA tenderness  Lungs:     Clear to auscultation bilaterally, respirations unlabored  Chest wall:    No tenderness or deformity  Heart:    Regular rate and rhythm, S1 and S2 normal, no  murmur, rub   or gallop  Abdomen:     Soft, non-tender, bowel sounds active all four quadrants,    no masses, no organomegaly  Genitalia:    Normal male without lesion, discharge or tenderness  Rectal:    Normal tone, normal prostate, no masses or tenderness;   guaiac negative stool  Extremities:   Extremities normal, atraumatic, no cyanosis or edema  Pulses:   2+ and symmetric all extremities  Skin:   Skin color, texture, turgor normal, no rashes or lesions  Lymph nodes:   Cervical, supraclavicular, and axillary nodes normal  Neurologic:   CNII-XII intact. Normal strength, sensation and reflexes      throughout       Assessment:      Patient presents for yearly preventative medicine examination.   all immunizations and health maintenance protocols were reviewed with the patient and they are up to date with these protocols.   screening laboratory values were reviewed with the patient including screening of hyperlipidemia PSA renal function and hepatic function.   There medications past medical history social history problem list and allergies were reviewed in detail.   Goals were established with regard to weight loss exercise diet in compliance with medications      Plan:     During the course of the visit the patient was educated and counseled about appropriate screening and preventive services including:  Influenza vaccine  Prostate cancer screening  Smoking cessation counseling  Diet review for nutrition referral? Yes ____  Not Indicated ____   Patient Instructions (the written plan) was given to the patient.  Medicare Attestation I have personally reviewed: The patient's medical and social history Their use of alcohol, tobacco or illicit drugs Their current medications and supplements The patient's functional ability including ADLs,fall risks, home safety risks, cognitive, and hearing and visual impairment Diet and physical activities Evidence for  depression or mood disorders  The patient's weight, height, BMI, and visual acuity have been recorded in the chart.  I have made referrals, counseling, and provided education to the patient based on review of the above and I have provided the patient with a written personalized care plan for preventive services.     Georgetta Haber, MD   10/04/2012

## 2012-10-04 NOTE — Telephone Encounter (Signed)
PT wife called to request that you give her husband a copy of her lab work from 09/06/12. Please assist.

## 2013-02-08 ENCOUNTER — Ambulatory Visit (INDEPENDENT_AMBULATORY_CARE_PROVIDER_SITE_OTHER): Payer: Medicare Other | Admitting: Family Medicine

## 2013-02-08 ENCOUNTER — Encounter: Payer: Self-pay | Admitting: Family Medicine

## 2013-02-08 VITALS — BP 130/78 | HR 76 | Temp 97.9°F | Wt 170.0 lb

## 2013-02-08 DIAGNOSIS — R0981 Nasal congestion: Secondary | ICD-10-CM

## 2013-02-08 DIAGNOSIS — J3489 Other specified disorders of nose and nasal sinuses: Secondary | ICD-10-CM

## 2013-02-08 MED ORDER — FLUTICASONE PROPIONATE 50 MCG/ACT NA SUSP: 2.0000 | Freq: Every day | NASAL | Status: DC

## 2013-02-08 NOTE — Progress Notes (Signed)
  Subjective:    Patient ID: Jason Summers, male    DOB: 1931/09/25, 79 y.o.   MRN: [id number]  HPI Acute visit Patient seen with bilateral frontal sinus pressure past 2 and half weeks. Just returned from [place] and had some pressure with lying no hearing changes no ear pain. Symptoms are actually improved past 4 days. Denies recent headache. No fever. No recent decongestant use. Generally, does not have allergies this time of year. Denies any cough. No sick contacts.  Past Medical History  Diagnosis Date  . Arthritis   . Spondylosis of cervical joint   . Lateral epicondylitis (tennis elbow)   . Hemorrhoids   . Cancer     basal cell on right temple   Past Surgical History  Procedure Laterality Date  . Colonoscopy  2003  . Knee arthroscopy  2006    right    reports that he has been smoking Cigarettes.  He has been smoking about 0.00 packs per day. He has never used smokeless tobacco. He reports that he drinks about 4.2 ounces of alcohol per week. He reports that he does not use illicit drugs. family history includes Coronary artery disease in an other family member; Heart disease in his mother; Hypertension in his mother. No Known Allergies    Review of Systems  Constitutional: Negative for fever and chills.  HENT: Positive for congestion. Negative for sore throat and trouble swallowing.   Respiratory: Negative for shortness of breath.   Cardiovascular: Negative for chest pain.  Neurological: Negative for dizziness and headaches.       Objective:   Physical Exam  Constitutional: He appears well-developed and well-nourished.  HENT:  Right Ear: External ear normal.  Left Ear: External ear normal.  Mouth/Throat: Oropharynx is clear and moist.  Nasal mucosa somewhat edematous but pink with no discharge  Neck: Neck supple.  Cardiovascular: Normal rate and regular rhythm.   Pulmonary/Chest: Effort normal and breath sounds normal. No respiratory distress. He has no wheezes. He  has no rales.  Lymphadenopathy:    He has no cervical adenopathy.          Assessment & Plan:  Sinus congestion. No evidence for acute infection. Avoid decongestants given his age. Flonase nasal 2 sprays per nostril once daily as needed.

## 2013-02-08 NOTE — Patient Instructions (Addendum)
Consider starting Flonase nasal spray if your head congestion recurs.

## 2013-02-24 DIAGNOSIS — L570 Actinic keratosis: Secondary | ICD-10-CM | POA: Diagnosis not present

## 2013-02-24 DIAGNOSIS — D239 Other benign neoplasm of skin, unspecified: Secondary | ICD-10-CM | POA: Diagnosis not present

## 2013-02-24 DIAGNOSIS — L821 Other seborrheic keratosis: Secondary | ICD-10-CM | POA: Diagnosis not present

## 2013-02-24 DIAGNOSIS — Z85828 Personal history of other malignant neoplasm of skin: Secondary | ICD-10-CM | POA: Diagnosis not present

## 2013-04-04 ENCOUNTER — Telehealth: Payer: Self-pay | Admitting: Internal Medicine

## 2013-04-06 ENCOUNTER — Ambulatory Visit: Payer: Medicare Other | Admitting: Internal Medicine

## 2013-04-13 ENCOUNTER — Ambulatory Visit: Payer: Medicare Other | Admitting: Internal Medicine

## 2013-04-14 ENCOUNTER — Ambulatory Visit (INDEPENDENT_AMBULATORY_CARE_PROVIDER_SITE_OTHER): Payer: Medicare Other | Admitting: Internal Medicine

## 2013-04-14 ENCOUNTER — Encounter: Payer: Self-pay | Admitting: Internal Medicine

## 2013-04-14 VITALS — BP 140/78 | HR 76 | Temp 98.3°F | Resp 16 | Ht 70.0 in | Wt 172.0 lb

## 2013-04-14 DIAGNOSIS — R972 Elevated prostate specific antigen [PSA]: Secondary | ICD-10-CM | POA: Diagnosis not present

## 2013-04-14 DIAGNOSIS — Z23 Encounter for immunization: Secondary | ICD-10-CM

## 2013-04-14 DIAGNOSIS — E785 Hyperlipidemia, unspecified: Secondary | ICD-10-CM

## 2013-04-14 DIAGNOSIS — T887XXA Unspecified adverse effect of drug or medicament, initial encounter: Secondary | ICD-10-CM

## 2013-04-14 NOTE — Progress Notes (Signed)
Pre-visit discussion using our clinic review tool. No additional management support is needed unless otherwise documented below in the visit note.

## 2013-04-14 NOTE — Patient Instructions (Addendum)
Today schedule yearly wellness examination in 6 months and a visit to the laboratory for problem focused blood work prior to that visit  Obtained a blood pressure cough and start to check your blood pressure when you feel the pressure in your temples if your blood pressures greater than 140 on a regular basis when this is occurring you need to report back to [place] to discuss medications and better management in the meantime watching the salt you consume may help to keep the blood pressure down

## 2013-04-14 NOTE — Progress Notes (Signed)
Subjective:    Patient ID: Jason Summers, male    DOB: 1932-05-10, 79 y.o.   MRN: [id number]  HPI  Follow up lipids  PSA follow up for slight increase at last visit reveiwed labs form CPX  Review of Systems  Constitutional: Negative for fever and fatigue.  HENT: Negative for congestion, hearing loss and postnasal drip.   Eyes: Negative for discharge, redness and visual disturbance.  Respiratory: Negative for cough, shortness of breath and wheezing.   Cardiovascular: Negative for leg swelling.  Gastrointestinal: Negative for abdominal pain, constipation and abdominal distention.  Genitourinary: Negative for urgency and frequency.  Musculoskeletal: Negative for arthralgias, joint swelling and neck pain.  Skin: Negative for color change and rash.  Neurological: Negative for weakness and light-headedness.  Hematological: Negative for adenopathy.  Psychiatric/Behavioral: Negative for behavioral problems.   Past Medical History  Diagnosis Date  . Arthritis   . Spondylosis of cervical joint   . Lateral epicondylitis (tennis elbow)   . Hemorrhoids   . Cancer     basal cell on right temple    History   Social History  . Marital Status: Married    Spouse Name: N/A    Number of Children: N/A  . Years of Education: N/A   Occupational History  . Retired     Social History Main Topics  . Smoking status: Current Every Day Smoker    Types: Cigarettes  . Smokeless tobacco: Never Used     Comment: smokes 1 ciagettes occasionally  . Alcohol Use: 4.2 oz/week    7 Glasses of wine per week     Comment: 1-2 glasses of wine a day  . Drug Use: No  . Sexual Activity: Yes   Other Topics Concern  . Not on file   Social History Narrative  . No narrative on file    Past Surgical History  Procedure Laterality Date  . Colonoscopy  2003  . Knee arthroscopy  2006    right    Family History  Problem Relation Age of Onset  . Coronary artery disease    . Hypertension Mother   .  Heart disease Mother     No Known Allergies  Current Outpatient Prescriptions on File Prior to Visit  Medication Sig Dispense Refill  . aspirin 81 MG EC tablet Take 81 mg by mouth daily.        . fish oil-omega-3 fatty acids 1000 MG capsule Take 2 g by mouth 2 (two) times daily.        . fluticasone (FLONASE) 50 MCG/ACT nasal spray Place 2 sprays into the nose daily.  16 g  6  . glucosamine-chondroitin 500-400 MG tablet Take 1 tablet by mouth 2 (two) times daily.        . Naproxen Sodium (NAPRELAN) 750 MG TB24 Take 1 tablet (750 mg total) by mouth daily.  30 each  1  . omeprazole (PRILOSEC) 20 MG capsule Take 1 capsule (20 mg total) by mouth daily.  30 capsule  11   No current facility-administered medications on file prior to visit.    BP 140/78  Pulse 76  Temp(Src) 98.3 F (36.8 C)  Resp 16  Ht _0  (1.778 m)  Wt 172 lb (78.019 kg)  BMI 24.68 kg/m2       Objective:   Physical Exam  Nursing note and vitals reviewed. Constitutional: He appears well-developed and well-nourished.  HENT:  Head: Normocephalic and atraumatic.  Eyes: Conjunctivae are normal. Pupils are equal, round,  and reactive to light.  Neck: Normal range of motion. Neck supple.  Cardiovascular: Normal rate and regular rhythm.   Murmur heard. Pulmonary/Chest: Effort normal and breath sounds normal.  Abdominal: Soft. Bowel sounds are normal.          Assessment & Plan:  PSA free and total And schedule CPX

## 2013-04-15 LAB — PSA, TOTAL AND FREE: PSA, Free Pct: 28 % (ref >25)

## 2013-04-22 NOTE — Telephone Encounter (Signed)
close

## 2013-05-09 DIAGNOSIS — H6120 Impacted cerumen, unspecified ear: Secondary | ICD-10-CM | POA: Diagnosis not present

## 2013-09-08 DIAGNOSIS — L570 Actinic keratosis: Secondary | ICD-10-CM | POA: Diagnosis not present

## 2013-09-08 DIAGNOSIS — L821 Other seborrheic keratosis: Secondary | ICD-10-CM | POA: Diagnosis not present

## 2013-10-07 ENCOUNTER — Other Ambulatory Visit (INDEPENDENT_AMBULATORY_CARE_PROVIDER_SITE_OTHER): Payer: Medicare Other

## 2013-10-07 DIAGNOSIS — E039 Hypothyroidism, unspecified: Secondary | ICD-10-CM

## 2013-10-07 DIAGNOSIS — R972 Elevated prostate specific antigen [PSA]: Secondary | ICD-10-CM

## 2013-10-07 DIAGNOSIS — T887XXA Unspecified adverse effect of drug or medicament, initial encounter: Secondary | ICD-10-CM

## 2013-10-07 DIAGNOSIS — E785 Hyperlipidemia, unspecified: Secondary | ICD-10-CM | POA: Diagnosis not present

## 2013-10-07 DIAGNOSIS — D649 Anemia, unspecified: Secondary | ICD-10-CM | POA: Diagnosis not present

## 2013-10-07 LAB — CBC WITH DIFFERENTIAL/PLATELET
BASOS PCT: 0.5 % (ref 0.0–3.0)
Basophils Absolute: 0 10*3/uL (ref 0.0–0.1)
EOS PCT: 2 % (ref 0.0–5.0)
Eosinophils Absolute: 0.1 10*3/uL (ref 0.0–0.7)
HEMATOCRIT: 43 % (ref 39.0–52.0)
HEMOGLOBIN: 14.2 g/dL (ref 13.0–17.0)
LYMPHS ABS: 1.6 10*3/uL (ref 0.7–4.0)
Lymphocytes Relative: 28 % (ref 12.0–46.0)
MCHC: 33.1 g/dL (ref 30.0–36.0)
MCV: 90.7 fl (ref 78.0–100.0)
MONO ABS: 0.5 10*3/uL (ref 0.1–1.0)
Monocytes Relative: 9.5 % (ref 3.0–12.0)
NEUTROS ABS: 3.4 10*3/uL (ref 1.4–7.7)
Neutrophils Relative %: 60 % (ref 43.0–77.0)
PLATELETS: 189 10*3/uL (ref 150.0–400.0)
RBC: 4.74 Mil/uL (ref 4.22–5.81)
RDW: 12.8 % (ref 11.5–14.6)
WBC: 5.7 10*3/uL (ref 4.5–10.5)

## 2013-10-07 LAB — BASIC METABOLIC PANEL
BUN: 18 mg/dL (ref 6–23)
CHLORIDE: 106 meq/L (ref 96–112)
CO2: 26 meq/L (ref 19–32)
Calcium: 8.8 mg/dL (ref 8.4–10.5)
Creatinine, Ser: 0.9 mg/dL (ref 0.4–1.5)
GFR: 88.6 mL/min (ref >60.00)
Glucose, Bld: 86 mg/dL (ref 70–99)
Potassium: 4.8 mEq/L (ref 3.5–5.1)
Sodium: 138 mEq/L (ref 135–145)

## 2013-10-07 LAB — HEPATIC FUNCTION PANEL
ALT: 16 U/L (ref 0–53)
AST: 18 U/L (ref 0–37)
Albumin: 3.8 g/dL (ref 3.5–5.2)
Alkaline Phosphatase: 56 U/L (ref 39–117)
Bilirubin, Direct: 0.1 mg/dL (ref 0.0–0.3)
TOTAL PROTEIN: 6.4 g/dL (ref 6.0–8.3)
Total Bilirubin: 0.7 mg/dL (ref 0.3–1.2)

## 2013-10-07 LAB — LIPID PANEL
CHOL/HDL RATIO: 4
CHOLESTEROL: 180 mg/dL (ref 0–200)
HDL: 50.8 mg/dL (ref >39.00)
LDL Cholesterol: 116 mg/dL — ABNORMAL HIGH (ref 0–99)
Triglycerides: 67 mg/dL (ref 0.0–149.0)
VLDL: 13.4 mg/dL (ref 0.0–40.0)

## 2013-10-07 LAB — TSH: TSH: 2.09 u[IU]/mL (ref 0.35–5.50)

## 2013-10-07 LAB — PSA: PSA: 2.26 ng/mL (ref 0.10–4.00)

## 2013-10-14 ENCOUNTER — Encounter: Payer: Self-pay | Admitting: Internal Medicine

## 2013-10-14 ENCOUNTER — Ambulatory Visit (INDEPENDENT_AMBULATORY_CARE_PROVIDER_SITE_OTHER): Payer: Medicare Other | Admitting: Internal Medicine

## 2013-10-14 VITALS — BP 124/84 | HR 64 | Temp 98.1°F | Ht 68.5 in | Wt 170.0 lb

## 2013-10-14 DIAGNOSIS — Z23 Encounter for immunization: Secondary | ICD-10-CM

## 2013-10-14 DIAGNOSIS — Z0000 Encounter for general adult medical examination without abnormal findings: Secondary | ICD-10-CM | POA: Diagnosis not present

## 2013-10-14 NOTE — Progress Notes (Signed)
Subjective:    Patient ID: Jason Summers, male    DOB: [birth year], 80 y.o.   MRN: [id number]  HPI Allergies Increased arthritis pain Stable xcept mild eustation tube dysfunction    Review of Systems  Constitutional: Negative for fever and fatigue.  HENT: Positive for ear pain. Negative for congestion, hearing loss and postnasal drip.   Eyes: Negative for discharge, redness and visual disturbance.  Respiratory: Negative for cough, shortness of breath and wheezing.   Cardiovascular: Negative for leg swelling.  Gastrointestinal: Negative for abdominal pain, constipation and abdominal distention.  Genitourinary: Negative for urgency and frequency.  Musculoskeletal: Negative for arthralgias, joint swelling and neck pain.  Skin: Negative for color change and rash.  Neurological: Negative for weakness and light-headedness.  Hematological: Negative for adenopathy.  Psychiatric/Behavioral: Negative for behavioral problems.   Past Medical History  Diagnosis Date  . Arthritis   . Spondylosis of cervical joint   . Lateral epicondylitis (tennis elbow)   . Hemorrhoids   . Cancer     basal cell on right temple    History   Social History  . Marital Status: Married    Spouse Name: N/A    Number of Children: N/A  . Years of Education: N/A   Occupational History  . Retired     Social History Main Topics  . Smoking status: Current Every Day Smoker    Types: Cigarettes  . Smokeless tobacco: Never Used     Comment: smokes 1 ciagettes occasionally  . Alcohol Use: 4.2 oz/week    7 Glasses of wine per week     Comment: 1-2 glasses of wine a day  . Drug Use: No  . Sexual Activity: Yes   Other Topics Concern  . Not on file   Social History Narrative  . No narrative on file    Past Surgical History  Procedure Laterality Date  . Colonoscopy  2003  . Knee arthroscopy  2006    right    Family History  Problem Relation Age of Onset  . Coronary artery disease    .  Hypertension Mother   . Heart disease Mother     No Known Allergies  Current Outpatient Prescriptions on File Prior to Visit  Medication Sig Dispense Refill  . aspirin 81 MG EC tablet Take 81 mg by mouth daily.        . fish oil-omega-3 fatty acids 1000 MG capsule Take 2 g by mouth 2 (two) times daily.        . fluticasone (FLONASE) 50 MCG/ACT nasal spray Place 2 sprays into the nose daily.  16 g  6  . glucosamine-chondroitin 500-400 MG tablet Take 1 tablet by mouth 2 (two) times daily.        . Naproxen Sodium (NAPRELAN) 750 MG TB24 Take 1 tablet (750 mg total) by mouth daily.  30 each  1  . omeprazole (PRILOSEC) 20 MG capsule Take 1 capsule (20 mg total) by mouth daily.  30 capsule  11   No current facility-administered medications on file prior to visit.    BP 124/84  Pulse 64  Temp(Src) 98.1 F (36.7 C) (Oral)  Ht 5' 8.5" (1.74 m)  Wt 170 lb (77.111 kg)  BMI 25.47 kg/m2       Objective:   Physical Exam  Constitutional: He is oriented to person, place, and time. He appears well-developed and well-nourished.  HENT:  Head: Normocephalic and atraumatic.  Eyes: Conjunctivae are normal. Pupils are equal, round,  and reactive to light.  Neck: Normal range of motion. Neck supple.  Cardiovascular: Normal rate and regular rhythm.   Pulmonary/Chest: Effort normal and breath sounds normal.  Abdominal: Soft. Bowel sounds are normal.  Genitourinary: Rectum normal and prostate normal.  Musculoskeletal: Normal range of motion.  Neurological: He is alert and oriented to person, place, and time.  Skin: Skin is warm and dry.          Assessment & Plan:  Stable mild athritis in knees Patient presents for yearly preventative medicine examination. Medicare questionnaire was completed  All immunizations and health maintenance protocols were reviewed with the patient and needed orders were placed.  Appropriate screening laboratory values were ordered for the patient including  screening of hyperlipidemia, renal function and hepatic function. If indicated by BPH, a PSA was ordered.  Medication reconciliation,  past medical history, social history, problem list and allergies were reviewed in detail with the patient  Goals were established with regard to weight loss, exercise, and  diet in compliance with medications  End of life planning was discussed.   Subjective:    Jason Summers is a 80 y.o. male who presents for Medicare Annual/Subsequent preventive examination.   Preventive Screening-Counseling & Management  Tobacco History  Smoking status  . Current Every Day Smoker  . Types: Cigarettes  Smokeless tobacco  . Never Used    Comment: smokes 1 ciagettes occasionally    Problems Prior to Visit 1.   Current Problems (verified) Patient Active Problem List   Diagnosis Date Noted  . OSTEOARTHRITIS 07/17/2010  . DYSPHAGIA 07/17/2010  . DYSPHAGIA, PHARYNGOESOPHAGEAL PHASE 05/07/2010  . HYPERCHOLESTEROLEMIA, MILD 09/06/2008  . SPRAIN AND STRAIN OF SACROILIAC 09/06/2008  . OTHER ACUTE REACTIONS TO STRESS 10/27/2007  . FACIAL FLUSHING 09/23/2007  . BURSITIS, SUBDELTOID 04/22/2007  . GANGLION OF JOINT 04/22/2007  . HEMORRHOIDS, INTERNAL 02/18/2007    Medications Prior to Visit Current Outpatient Prescriptions on File Prior to Visit  Medication Sig Dispense Refill  . aspirin 81 MG EC tablet Take 81 mg by mouth daily.        . fish oil-omega-3 fatty acids 1000 MG capsule Take 2 g by mouth 2 (two) times daily.        . fluticasone (FLONASE) 50 MCG/ACT nasal spray Place 2 sprays into the nose daily.  16 g  6  . glucosamine-chondroitin 500-400 MG tablet Take 1 tablet by mouth 2 (two) times daily.        . Naproxen Sodium (NAPRELAN) 750 MG TB24 Take 1 tablet (750 mg total) by mouth daily.  30 each  1  . omeprazole (PRILOSEC) 20 MG capsule Take 1 capsule (20 mg total) by mouth daily.  30 capsule  11   No current facility-administered medications on file  prior to visit.    Current Medications (verified) Current Outpatient Prescriptions  Medication Sig Dispense Refill  . aspirin 81 MG EC tablet Take 81 mg by mouth daily.        . fish oil-omega-3 fatty acids 1000 MG capsule Take 2 g by mouth 2 (two) times daily.        . fluticasone (FLONASE) 50 MCG/ACT nasal spray Place 2 sprays into the nose daily.  16 g  6  . glucosamine-chondroitin 500-400 MG tablet Take 1 tablet by mouth 2 (two) times daily.        . Naproxen Sodium (NAPRELAN) 750 MG TB24 Take 1 tablet (750 mg total) by mouth daily.  30 each  1  .  omeprazole (PRILOSEC) 20 MG capsule Take 1 capsule (20 mg total) by mouth daily.  30 capsule  11   No current facility-administered medications for this visit.     Allergies (verified) Review of patient's allergies indicates no known allergies.   PAST HISTORY  Family History Family History  Problem Relation Age of Onset  . Coronary artery disease    . Hypertension Mother   . Heart disease Mother     Social History History  Substance Use Topics  . Smoking status: Current Every Day Smoker    Types: Cigarettes  . Smokeless tobacco: Never Used     Comment: smokes 1 ciagettes occasionally  . Alcohol Use: 4.2 oz/week    7 Glasses of wine per week     Comment: 1-2 glasses of wine a day    Are there smokers in your home (other than you)?  No  Risk Factors Current exercise habits: The patient does not participate in regular exercise at present.  Dietary issues discussed: none   Cardiac risk factors: advanced age (older than 53 for men, 7 for women), dyslipidemia, hypertension and male gender.  Depression Screen (Note: if answer to either of the following is "Yes", a more complete depression screening is indicated)   Q1: Over the past two weeks, have you felt down, depressed or hopeless? No  Q2: Over the past two weeks, have you felt little interest or pleasure in doing things? Yes  Have you lost interest or pleasure in daily  life? No  Do you often feel hopeless? No  Do you cry easily over simple problems? No  Activities of Daily Living In your present state of health, do you have any difficulty performing the following activities?:  Driving? No Managing money?  No Feeding yourself? No Getting from bed to chair? No Climbing a flight of stairs? No Preparing food and eating?: No Bathing or showering? No Getting dressed: No Getting to the toilet? No Using the toilet:No Moving around from place to place: No In the past year have you fallen or had a near fall?:No   Are you sexually active?  Yes  Do you have more than one partner?  No  Hearing Difficulties: No Do you often ask people to speak up or repeat themselves? No Do you experience ringing or noises in your ears? No Do you have difficulty understanding soft or whispered voices? No   Do you feel that you have a problem with memory? No  Do you often misplace items? No  Do you feel safe at home?  Yes  Cognitive Testing  Alert? Yes  Normal Appearance?Yes  Oriented to person? Yes  Place? Yes   Time? Yes  Recall of three objects?  Yes  Can perform simple calculations? Yes  Displays appropriate judgment?Yes  Can read the correct time from a watch face?Yes   Advanced Directives have been discussed with the patient? Yes   List the Names of Other Physician/Practitioners you currently use: 1.    Indicate any recent Medical Services you may have received from other than [facility] providers in the past year (date may be approximate).  Immunization History  Administered Date(s) Administered  . Influenza Split 03/19/2011, 03/12/2012  . Influenza Whole 06/09/1997, 04/22/2007, 03/14/2008, 03/28/2009, 05/09/2010  . Influenza, High Dose Seasonal PF 04/14/2013  . Pneumococcal Polysaccharide-23 06/09/2004, 10/04/2012  . Td 06/09/2005    Screening Tests Health Maintenance  Topic Date Due  . Zostavax  12/05/1993  . Influenza Vaccine  01/07/2014  .  Tetanus/tdap  06/10/2015  . Colonoscopy  09/24/2021  . Pneumococcal Polysaccharide Vaccine Age 23 And Over  Completed    All answers were reviewed with the patient and necessary referrals were made:  Georgetta Haber, MD   10/14/2013   History reviewed: allergies, current medications, past family history, past medical history, past social history, past surgical history and problem list  Review of Systems Pertinent items are noted in HPI.    Objective:     Vision by Snellen chart: right eye:20/20, left eye:20/20 Blood pressure 124/84, pulse 64, temperature 98.1 F (36.7 C), temperature source Oral, height 5' 8.5" (1.74 m), weight 170 lb (77.111 kg). Body mass index is 25.47 kg/(m^2). Exam per promblem focused visit     Assessment:     Patient presents for yearly preventative medicine examination. Medicare questionnaire was completed  All immunizations and health maintenance protocols were reviewed with the patient and needed orders were placed.  Appropriate screening laboratory values were ordered for the patient including screening of hyperlipidemia, renal function and hepatic function. If indicated by BPH, a PSA was ordered.  Medication reconciliation,  past medical history, social history, problem list and allergies were reviewed in detail with the patient  Goals were established with regard to weight loss, exercise, and  diet in compliance with medications  End of life planning was discussed.       Plan:     During the course of the visit the patient was educated and counseled about appropriate screening and preventive services including:    Pneumococcal vaccine   Diet review for nutrition referral? Yes ____  Not Indicated ____   Patient Instructions (the written plan) was given to the patient.  Medicare Attestation I have personally reviewed: The patient's medical and social history Their use of alcohol, tobacco or illicit drugs Their current  medications and supplements The patient's functional ability including ADLs,fall risks, home safety risks, cognitive, and hearing and visual impairment Diet and physical activities Evidence for depression or mood disorders  The patient's weight, height, BMI, and visual acuity have been recorded in the chart.  I have made referrals, counseling, and provided education to the patient based on review of the above and I have provided the patient with a written personalized care plan for preventive services.     Georgetta Haber, MD   10/14/2013

## 2013-10-14 NOTE — Addendum Note (Signed)
Addended by: Westley Hummer B on: 10/14/2013 03:18 PM   Modules accepted: Orders

## 2013-10-14 NOTE — Patient Instructions (Signed)
The patient is instructed to continue all medications as prescribed. Schedule followup with check out clerk upon leaving the clinic

## 2013-10-14 NOTE — Progress Notes (Signed)
Pre visit review using our clinic review tool, if applicable. No additional management support is needed unless otherwise documented below in the visit note.

## 2013-10-15 ENCOUNTER — Telehealth: Payer: Self-pay | Admitting: Internal Medicine

## 2013-10-15 NOTE — Telephone Encounter (Signed)
Relevant patient education mailed to patient.

## 2014-02-24 DIAGNOSIS — H01009 Unspecified blepharitis unspecified eye, unspecified eyelid: Secondary | ICD-10-CM | POA: Diagnosis not present

## 2014-02-24 DIAGNOSIS — H259 Unspecified age-related cataract: Secondary | ICD-10-CM | POA: Diagnosis not present

## 2014-02-24 DIAGNOSIS — H5231 Anisometropia: Secondary | ICD-10-CM | POA: Diagnosis not present

## 2014-02-24 DIAGNOSIS — H43819 Vitreous degeneration, unspecified eye: Secondary | ICD-10-CM | POA: Diagnosis not present

## 2014-02-28 DIAGNOSIS — H2510 Age-related nuclear cataract, unspecified eye: Secondary | ICD-10-CM | POA: Diagnosis not present

## 2014-02-28 DIAGNOSIS — H25019 Cortical age-related cataract, unspecified eye: Secondary | ICD-10-CM | POA: Diagnosis not present

## 2014-03-14 DIAGNOSIS — M170 Bilateral primary osteoarthritis of knee: Secondary | ICD-10-CM | POA: Diagnosis not present

## 2014-03-15 DIAGNOSIS — H35352 Cystoid macular degeneration, left eye: Secondary | ICD-10-CM | POA: Diagnosis not present

## 2014-03-21 ENCOUNTER — Ambulatory Visit (INDEPENDENT_AMBULATORY_CARE_PROVIDER_SITE_OTHER): Payer: Medicare Other | Admitting: Internal Medicine

## 2014-03-21 ENCOUNTER — Encounter: Payer: Self-pay | Admitting: Internal Medicine

## 2014-03-21 VITALS — BP 126/80 | HR 59 | Temp 97.9°F | Resp 20 | Ht 68.5 in | Wt 168.0 lb

## 2014-03-21 DIAGNOSIS — E7800 Pure hypercholesterolemia, unspecified: Secondary | ICD-10-CM

## 2014-03-21 DIAGNOSIS — M159 Polyosteoarthritis, unspecified: Secondary | ICD-10-CM

## 2014-03-21 DIAGNOSIS — M150 Primary generalized (osteo)arthritis: Secondary | ICD-10-CM | POA: Diagnosis not present

## 2014-03-21 NOTE — Progress Notes (Signed)
Subjective:    Patient ID: Jason Summers, male    DOB: 02/17/1932, 80 y.o.   MRN: [id number]  HPI  80 year old patient who is in today for followup and to establish with my practice.  He enjoyed remarkably good health.  Medical problems include the mild dyslipidemia.  He has a history of gastroesophageal reflux disease and does take omeprazole periodically.  He has had a fairly recent left cataract extraction surgery, but still having some visual difficulties.  He is scheduled for followup with ophthalmology soon.  He has had recent cortisone injections to both knees.  He does have a history of osteoarthritis. No other concerns or complaints today.  He was seen by his prior PCP in the spring and had a complete exam done at that time. Laboratory studies reviewed Medical record reviewed  Past Medical History  Diagnosis Date  . Arthritis   . Spondylosis of cervical joint   . Lateral epicondylitis (tennis elbow)   . Hemorrhoids   . Cancer     basal cell on right temple    History   Social History  . Marital Status: Married    Spouse Name: N/A    Number of Children: N/A  . Years of Education: N/A   Occupational History  . Retired     Social History Main Topics  . Smoking status: Former Smoker    Types: Cigarettes    Quit date: 03/09/2013  . Smokeless tobacco: Never Used     Comment: smokes 1 ciagettes occasionally  . Alcohol Use: 4.2 oz/week    7 Glasses of wine per week     Comment: 1-2 glasses of wine a day  . Drug Use: No  . Sexual Activity: Yes   Other Topics Concern  . Not on file   Social History Narrative  . No narrative on file    Past Surgical History  Procedure Laterality Date  . Colonoscopy  2003  . Knee arthroscopy  2006    right    Family History  Problem Relation Age of Onset  . Coronary artery disease    . Hypertension Mother   . Heart disease Mother     No Known Allergies  Current Outpatient Prescriptions on File Prior to Visit  Medication  Sig Dispense Refill  . aspirin 81 MG EC tablet Take 81 mg by mouth daily.        . fish oil-omega-3 fatty acids 1000 MG capsule Take 2 g by mouth 2 (two) times daily.        Marland Kitchen glucosamine-chondroitin 500-400 MG tablet Take 1 tablet by mouth 2 (two) times daily.        . [DISCONTINUED] omeprazole (PRILOSEC) 20 MG capsule Take 1 capsule (20 mg total) by mouth daily.  30 capsule  11   No current facility-administered medications on file prior to visit.    BP 126/80  Pulse 59  Temp(Src) 97.9 F (36.6 C) (Oral)  Resp 20  Ht 5' 8.5" (1.74 m)  Wt 168 lb (76.204 kg)  BMI 25.17 kg/m2  SpO2 98%      Review of Systems  Constitutional: Negative for fever, chills, appetite change and fatigue.  HENT: Negative for congestion, dental problem, ear pain, hearing loss, sore throat, tinnitus, trouble swallowing and voice change.   Eyes: Positive for visual disturbance. Negative for pain and discharge.  Respiratory: Negative for cough, chest tightness, wheezing and stridor.   Cardiovascular: Negative for chest pain, palpitations and leg swelling.  Gastrointestinal: Negative  for nausea, vomiting, abdominal pain, diarrhea, constipation, blood in stool and abdominal distention.  Genitourinary: Negative for urgency, hematuria, flank pain, discharge, difficulty urinating and genital sores.  Musculoskeletal: Positive for arthralgias. Negative for back pain, gait problem, joint swelling, myalgias and neck stiffness.  Skin: Negative for rash.  Neurological: Negative for dizziness, syncope, speech difficulty, weakness, numbness and headaches.  Hematological: Negative for adenopathy. Does not bruise/bleed easily.  Psychiatric/Behavioral: Negative for behavioral problems and dysphoric mood. The patient is not nervous/anxious.        Objective:   Physical Exam  Constitutional: He is oriented to person, place, and time. He appears well-developed.  HENT:  Head: Normocephalic.  Right Ear: External ear  normal.  Left Ear: External ear normal.  Eyes: Conjunctivae and EOM are normal.  Neck: Normal range of motion.  Cardiovascular: Normal rate, normal heart sounds and intact distal pulses.   Pedal pulses, full  Pulmonary/Chest: Breath sounds normal.  Abdominal: Bowel sounds are normal.  Musculoskeletal: Normal range of motion. He exhibits no edema and no tenderness.  Neurological: He is alert and oriented to person, place, and time.  Psychiatric: He has a normal mood and affect. His behavior is normal.          Assessment & Plan:   Osteoarthritis.  Status post bilateral knee cortisone injections History mild dyslipidemia Status post recent left cataract extraction surgery Preventive health.  Final colonoscopy performed about 3 years ago  CPX in 7 months Medications updated

## 2014-03-21 NOTE — Patient Instructions (Signed)
It is important that you exercise regularly, at least 20 minutes 3 to 4 times per week.  If you develop chest pain or shortness of breath seek  medical attention.

## 2014-03-21 NOTE — Progress Notes (Signed)
Pre visit review using our clinic review tool, if applicable. No additional management support is needed unless otherwise documented below in the visit note.

## 2014-04-05 DIAGNOSIS — H3581 Retinal edema: Secondary | ICD-10-CM | POA: Diagnosis not present

## 2014-04-10 ENCOUNTER — Ambulatory Visit: Payer: Medicare Other | Admitting: *Deleted

## 2014-04-14 ENCOUNTER — Telehealth: Payer: Self-pay | Admitting: Internal Medicine

## 2014-04-14 NOTE — Telephone Encounter (Signed)
Not on med list, please advise

## 2014-04-14 NOTE — Telephone Encounter (Signed)
Pt request refill Naproxen Sodium (NAPRELAN) 750 MG TB24  Pt has muscle pain time to time and dr Arnoldo Morale used to give pt this rx. [facility]  Pt aware dr out til monday

## 2014-04-17 MED ORDER — NAPROXEN SODIUM ER 750 MG PO TB24: 750.0000 mg | ORAL_TABLET | Freq: Every day | ORAL | Status: DC

## 2014-04-17 NOTE — Telephone Encounter (Signed)
rx sent in electronically 

## 2014-04-17 NOTE — Telephone Encounter (Signed)
ok 

## 2014-05-08 DIAGNOSIS — H35372 Puckering of macula, left eye: Secondary | ICD-10-CM | POA: Diagnosis not present

## 2014-05-08 DIAGNOSIS — H3581 Retinal edema: Secondary | ICD-10-CM | POA: Diagnosis not present

## 2014-05-16 DIAGNOSIS — H35372 Puckering of macula, left eye: Secondary | ICD-10-CM | POA: Diagnosis not present

## 2014-05-16 DIAGNOSIS — H43811 Vitreous degeneration, right eye: Secondary | ICD-10-CM | POA: Diagnosis not present

## 2014-05-16 DIAGNOSIS — H3531 Nonexudative age-related macular degeneration: Secondary | ICD-10-CM | POA: Diagnosis not present

## 2014-07-11 ENCOUNTER — Telehealth: Payer: Self-pay | Admitting: Internal Medicine

## 2014-07-11 NOTE — Telephone Encounter (Signed)
Please advise if okay to fill Lorazepam?

## 2014-07-11 NOTE — Telephone Encounter (Signed)
Pt needs refill on anxiety med wife will call pharm for name of med

## 2014-07-11 NOTE — Telephone Encounter (Signed)
Lorazepam .5 mg need re-fill send to [facility] on [address]

## 2014-07-11 NOTE — Telephone Encounter (Signed)
ok 

## 2014-07-12 MED ORDER — LORAZEPAM 0.5 MG PO TABS: 0.5000 mg | ORAL_TABLET | Freq: Two times a day (BID) | ORAL | Status: DC | PRN

## 2014-07-12 NOTE — Telephone Encounter (Signed)
Left detailed message Rx called into pharmacy.

## 2014-07-17 DIAGNOSIS — H01004 Unspecified blepharitis left upper eyelid: Secondary | ICD-10-CM | POA: Diagnosis not present

## 2014-07-17 DIAGNOSIS — H5712 Ocular pain, left eye: Secondary | ICD-10-CM | POA: Diagnosis not present

## 2014-07-17 DIAGNOSIS — H04123 Dry eye syndrome of bilateral lacrimal glands: Secondary | ICD-10-CM | POA: Diagnosis not present

## 2014-07-17 DIAGNOSIS — H01001 Unspecified blepharitis right upper eyelid: Secondary | ICD-10-CM | POA: Diagnosis not present

## 2014-08-03 ENCOUNTER — Telehealth: Payer: Self-pay | Admitting: Internal Medicine

## 2014-08-03 DIAGNOSIS — H5712 Ocular pain, left eye: Secondary | ICD-10-CM

## 2014-08-03 NOTE — Telephone Encounter (Signed)
Pt need a referral to [facility] and [place]   Pt was told by Dr Talbert Forest office that they req a REFERRAL      Doctor; Darleen Crocker

## 2014-08-04 ENCOUNTER — Telehealth: Payer: Self-pay | Admitting: Internal Medicine

## 2014-08-04 NOTE — Telephone Encounter (Signed)
I called the office of Dr Darleen Crocker  [facility]  To referrer this patient was told they could not see this pt just for  Left eye pain .  I called the patient to get more information on why he need the referral . I was told by the patient that  he need a second opinion  And that he had cataract removal surgery . Dr Talbert Forest office states that they do not do second opinions , pt was informed of this , and now the pt wife  wants Dr Raliegh Ip to recommended  Another  ophthalmologist  that can give the patient a second opinion pls advise

## 2014-08-04 NOTE — Telephone Encounter (Signed)
Please schedule ophthalmology referral.  Please

## 2014-08-04 NOTE — Telephone Encounter (Signed)
Spoke to pt, asked him why he needed to see the eye doctor? Pt stated having pain left eye. Told him okay will do referral and someone will contact him regarding an appt. Pt verbalized understanding.

## 2014-08-04 NOTE — Telephone Encounter (Signed)
Jason Summers, please schedule pt with whoever will see him or original doctor who did the surgery per Dr. Raliegh Ip. Thanks.

## 2014-08-04 NOTE — Telephone Encounter (Signed)
Please see message and advise 

## 2014-09-26 DIAGNOSIS — M170 Bilateral primary osteoarthritis of knee: Secondary | ICD-10-CM | POA: Diagnosis not present

## 2014-09-26 DIAGNOSIS — M25562 Pain in left knee: Secondary | ICD-10-CM | POA: Diagnosis not present

## 2014-09-26 DIAGNOSIS — M25561 Pain in right knee: Secondary | ICD-10-CM | POA: Diagnosis not present

## 2014-10-20 ENCOUNTER — Ambulatory Visit (INDEPENDENT_AMBULATORY_CARE_PROVIDER_SITE_OTHER): Payer: Medicare Other | Admitting: Internal Medicine

## 2014-10-20 ENCOUNTER — Encounter: Payer: Self-pay | Admitting: Internal Medicine

## 2014-10-20 ENCOUNTER — Encounter: Payer: Self-pay | Admitting: *Deleted

## 2014-10-20 VITALS — BP 110/70 | HR 68 | Temp 98.0°F | Resp 20 | Ht 67.25 in | Wt 168.0 lb

## 2014-10-20 DIAGNOSIS — M150 Primary generalized (osteo)arthritis: Secondary | ICD-10-CM

## 2014-10-20 DIAGNOSIS — Z0000 Encounter for general adult medical examination without abnormal findings: Secondary | ICD-10-CM

## 2014-10-20 DIAGNOSIS — E7800 Pure hypercholesterolemia, unspecified: Secondary | ICD-10-CM

## 2014-10-20 DIAGNOSIS — R531 Weakness: Secondary | ICD-10-CM | POA: Diagnosis not present

## 2014-10-20 DIAGNOSIS — M159 Polyosteoarthritis, unspecified: Secondary | ICD-10-CM

## 2014-10-20 LAB — COMPREHENSIVE METABOLIC PANEL
ALBUMIN: 4 g/dL (ref 3.5–5.2)
ALK PHOS: 59 U/L (ref 39–117)
ALT: 12 U/L (ref 0–53)
AST: 13 U/L (ref 0–37)
BUN: 16 mg/dL (ref 6–23)
CALCIUM: 9 mg/dL (ref 8.4–10.5)
CHLORIDE: 103 meq/L (ref 96–112)
CO2: 27 mEq/L (ref 19–32)
Creatinine, Ser: 0.93 mg/dL (ref 0.40–1.50)
GFR: 82.91 mL/min (ref >60.00)
Glucose, Bld: 100 mg/dL — ABNORMAL HIGH (ref 70–99)
POTASSIUM: 3.9 meq/L (ref 3.5–5.1)
SODIUM: 135 meq/L (ref 135–145)
Total Bilirubin: 0.6 mg/dL (ref 0.2–1.2)
Total Protein: 7.1 g/dL (ref 6.0–8.3)

## 2014-10-20 LAB — CBC WITH DIFFERENTIAL/PLATELET
Basophils Absolute: 0 10*3/uL (ref 0.0–0.1)
Basophils Relative: 0.4 % (ref 0.0–3.0)
Eosinophils Absolute: 0.1 10*3/uL (ref 0.0–0.7)
Eosinophils Relative: 2.4 % (ref 0.0–5.0)
HEMATOCRIT: 44.2 % (ref 39.0–52.0)
Hemoglobin: 14.9 g/dL (ref 13.0–17.0)
LYMPHS ABS: 1.4 10*3/uL (ref 0.7–4.0)
Lymphocytes Relative: 24.6 % (ref 12.0–46.0)
MCHC: 33.6 g/dL (ref 30.0–36.0)
MCV: 88.8 fl (ref 78.0–100.0)
MONOS PCT: 9.3 % (ref 3.0–12.0)
Monocytes Absolute: 0.5 10*3/uL (ref 0.1–1.0)
Neutro Abs: 3.6 10*3/uL (ref 1.4–7.7)
Neutrophils Relative %: 63.3 % (ref 43.0–77.0)
Platelets: 171 10*3/uL (ref 150.0–400.0)
RBC: 4.98 Mil/uL (ref 4.22–5.81)
RDW: 13.6 % (ref 11.5–15.5)
WBC: 5.7 10*3/uL (ref 4.0–10.5)

## 2014-10-20 LAB — LIPID PANEL
CHOL/HDL RATIO: 3
Cholesterol: 192 mg/dL (ref 0–200)
HDL: 61.6 mg/dL (ref >39.00)
LDL CALC: 110 mg/dL — AB (ref 0–99)
NONHDL: 130.4
Triglycerides: 104 mg/dL (ref 0.0–149.0)
VLDL: 20.8 mg/dL (ref 0.0–40.0)

## 2014-10-20 LAB — TSH: TSH: 1.31 u[IU]/mL (ref 0.35–4.50)

## 2014-10-20 MED ORDER — LORAZEPAM 0.5 MG PO TABS: 0.5000 mg | ORAL_TABLET | Freq: Two times a day (BID) | ORAL | Status: DC | PRN

## 2014-10-20 NOTE — Progress Notes (Signed)
Subjective:    Patient ID: Jason Summers, male    DOB: 09/05/1931, 81 y.o.   MRN: [id number]  HPI   81 year old patient who is in today for an annual exam.    Medical problems include  mild dyslipidemia.  He has a history of gastroesophageal reflux disease and does take omeprazole periodically.  He has had a fairly recent left cataract extraction surgery, but still having some visual difficulties.  He is scheduled for followup with ophthalmology soon.  He has had recent cortisone injections to both knees.  He does have a history of osteoarthritis. No other concerns or complaints today.  He was seen by his prior PCP in the spring of 2015  and had a complete exam done at that time. Laboratory studies reviewed from 2015. Medical record reviewed  Past Medical History  Diagnosis Date  . Arthritis   . Spondylosis of cervical joint   . Lateral epicondylitis (tennis elbow)   . Hemorrhoids   . Cancer     basal cell on right temple    History   Social History  . Marital Status: Married    Spouse Name: N/A  . Number of Children: N/A  . Years of Education: N/A   Occupational History  . Retired     Social History Main Topics  . Smoking status: Former Smoker    Types: Cigarettes    Quit date: 03/09/2013  . Smokeless tobacco: Never Used     Comment: smokes 1 ciagettes occasionally  . Alcohol Use: 4.2 oz/week    7 Glasses of wine per week     Comment: 1-2 glasses of wine a day  . Drug Use: No  . Sexual Activity: Yes   Other Topics Concern  . Not on file   Social History Narrative    Past Surgical History  Procedure Laterality Date  . Colonoscopy  2003  . Knee arthroscopy  2006    right    Family History  Problem Relation Age of Onset  . Coronary artery disease    . Hypertension Mother   . Heart disease Mother     No Known Allergies  Current Outpatient Prescriptions on File Prior to Visit  Medication Sig Dispense Refill  . aspirin 81 MG EC tablet Take 81 mg by  mouth daily.      . fish oil-omega-3 fatty acids 1000 MG capsule Take 2 g by mouth 2 (two) times daily.      Marland Kitchen glucosamine-chondroitin 500-400 MG tablet Take 1 tablet by mouth 2 (two) times daily.      . Naproxen Sodium (NAPRELAN) 750 MG TB24 Take 1 tablet (750 mg total) by mouth daily. 30 each 1  . omeprazole (PRILOSEC) 20 MG capsule Take 20 mg by mouth daily as needed.     No current facility-administered medications on file prior to visit.    BP 110/70 mmHg  Pulse 68  Temp(Src) 98 F (36.7 C) (Oral)  Resp 20  Ht 5' 7.25" (1.708 m)  Wt 168 lb (76.204 kg)  BMI 26.12 kg/m2  SpO2 98%   1. Risk factors, based on past  M,S,F history.  Cardiovascular risk factors include age and mild dyslipidemia  2.  Physical activities: No activity restrictions area.  It is active with yard work and walks frequently  3.  Depression/mood: History mild anxiety but no major depression or mood disorder  4.  Hearing: No major deficits  5.  ADL's: Totally independent in all aspects of daily living  6.  Fall risk: Low  7.  Home safety: No problems identified  8.  Height weight, and visual acuity; height and weight stable has a cataract extraction surgery  9.  Counseling: Heart healthy diet regular exercise.  All encouraged  10. Lab orders based on risk factors: We'll check laboratory update including lipid profile  11. Referral : Not appropriate at this time  12. Care plan: Continue efforts at risk factor modification.  Heart healthy diet and aerobic activities.  Encouraged   13. Cognitive assessment: Alert and oriented with normal affect.  No cognitive dysfunction   14. Screening: Patient had his final colonoscopy in 2013.  We'll continue annual preventive exams with screening lab.  Annual eye examinations.  Encouraged  15. Provider List Update: Primary care medicine and ophthalmology     Review of Systems  Constitutional: Negative for fever, chills, appetite change and fatigue.  HENT:  Negative for congestion, dental problem, ear pain, hearing loss, sore throat, tinnitus, trouble swallowing and voice change.   Eyes: Positive for visual disturbance. Negative for pain and discharge.  Respiratory: Negative for cough, chest tightness, wheezing and stridor.   Cardiovascular: Negative for chest pain, palpitations and leg swelling.  Gastrointestinal: Negative for nausea, vomiting, abdominal pain, diarrhea, constipation, blood in stool and abdominal distention.  Genitourinary: Negative for urgency, hematuria, flank pain, discharge, difficulty urinating and genital sores.  Musculoskeletal: Positive for arthralgias. Negative for myalgias, back pain, joint swelling, gait problem and neck stiffness.  Skin: Negative for rash.  Neurological: Negative for dizziness, syncope, speech difficulty, weakness, numbness and headaches.  Hematological: Negative for adenopathy. Does not bruise/bleed easily.  Psychiatric/Behavioral: Negative for behavioral problems and dysphoric mood. The patient is nervous/anxious.        Objective:   Physical Exam  Constitutional: He is oriented to person, place, and time. He appears well-developed.  HENT:  Head: Normocephalic.  Right Ear: External ear normal.  Left Ear: External ear normal.  Eyes: Conjunctivae and EOM are normal.  Neck: Normal range of motion.  Cardiovascular: Normal rate, normal heart sounds and intact distal pulses.   Pedal pulses, full  Pulmonary/Chest: Breath sounds normal.  Abdominal: Bowel sounds are normal.  Musculoskeletal: Normal range of motion. He exhibits no edema or tenderness.  Neurological: He is alert and oriented to person, place, and time.  Psychiatric: He has a normal mood and affect. His behavior is normal.          Assessment & Plan:  Preventive health examination Osteoarthritis.  Status post bilateral knee cortisone injections History mild dyslipidemia Status post recent left cataract extraction  surgery    Annual exam in 12 months Medications updated

## 2014-10-20 NOTE — Patient Instructions (Signed)
It is important that you exercise regularly, at least 20 minutes 3 to 4 times per week.  If you develop chest pain or shortness of breath seek  medical attention.  Limit your sodium (Salt) intake  Return in one year for follow-up  Health Maintenance A healthy lifestyle and preventative care can promote health and wellness.  Maintain regular health, dental, and eye exams.  Eat a healthy diet. Foods like vegetables, fruits, whole grains, low-fat dairy products, and lean protein foods contain the nutrients you need and are low in calories. Decrease your intake of foods high in solid fats, added sugars, and salt. Get information about a proper diet from your health care provider, if necessary.  Regular physical exercise is one of the most important things you can do for your health. Most adults should get at least 150 minutes of moderate-intensity exercise (any activity that increases your heart rate and causes you to sweat) each week. In addition, most adults need muscle-strengthening exercises on 2 or more days a week.   Maintain a healthy weight. The body mass index (BMI) is a screening tool to identify possible weight problems. It provides an estimate of body fat based on height and weight. Your health care provider can find your BMI and can help you achieve or maintain a healthy weight. For males 20 years and older:  A BMI below 18.5 is considered underweight.  A BMI of 18.5 to 24.9 is normal.  A BMI of 25 to 29.9 is considered overweight.  A BMI of 30 and above is considered obese.  Maintain normal blood lipids and cholesterol by exercising and minimizing your intake of saturated fat. Eat a balanced diet with plenty of fruits and vegetables. Blood tests for lipids and cholesterol should begin at age 81 and be repeated every 5 years. If your lipid or cholesterol levels are high, you are over age 59, or you are at high risk for heart disease, you may need your cholesterol levels checked  more frequently.Ongoing high lipid and cholesterol levels should be treated with medicines if diet and exercise are not working.  If you smoke, find out from your health care provider how to quit. If you do not use tobacco, do not start.  Lung cancer screening is recommended for adults aged 88-80 years who are at high risk for developing lung cancer because of a history of smoking. A yearly low-dose CT scan of the lungs is recommended for people who have at least a 30-pack-year history of smoking and are current smokers or have quit within the past 15 years. A pack year of smoking is smoking an average of 1 pack of cigarettes a day for 1 year (for example, a 30-pack-year history of smoking could mean smoking 1 pack a day for 30 years or 2 packs a day for 15 years). Yearly screening should continue until the smoker has stopped smoking for at least 15 years. Yearly screening should be stopped for people who develop a health problem that would prevent them from having lung cancer treatment.  If you choose to drink alcohol, do not have more than 2 drinks per day. One drink is considered to be 12 oz (360 mL) of beer, 5 oz (150 mL) of wine, or 1.5 oz (45 mL) of liquor.  Avoid the use of street drugs. Do not share needles with anyone. Ask for help if you need support or instructions about stopping the use of drugs.  High blood pressure causes heart disease and  increases the risk of stroke. Blood pressure should be checked at least every 1-2 years. Ongoing high blood pressure should be treated with medicines if weight loss and exercise are not effective.  If you are 4-27 years old, ask your health care provider if you should take aspirin to prevent heart disease.  Diabetes screening involves taking a blood sample to check your fasting blood sugar level. This should be done once every 3 years after age 32 if you are at a normal weight and without risk factors for diabetes. Testing should be considered at a  younger age or be carried out more frequently if you are overweight and have at least 1 risk factor for diabetes.  Colorectal cancer can be detected and often prevented. Most routine colorectal cancer screening begins at the age of 82 and continues through age 26. However, your health care provider may recommend screening at an earlier age if you have risk factors for colon cancer. On a yearly basis, your health care provider may provide home test kits to check for hidden blood in the stool. A small camera at the end of a tube may be used to directly examine the colon (sigmoidoscopy or colonoscopy) to detect the earliest forms of colorectal cancer. Talk to your health care provider about this at age 29 when routine screening begins. A direct exam of the colon should be repeated every 5-10 years through age 70, unless early forms of precancerous polyps or small growths are found.  People who are at an increased risk for hepatitis B should be screened for this virus. You are considered at high risk for hepatitis B if:  You were born in a country where hepatitis B occurs often. Talk with your health care provider about which countries are considered high risk.  Your parents were born in a high-risk country and you have not received a shot to protect against hepatitis B (hepatitis B vaccine).  You have HIV or AIDS.  You use needles to inject street drugs.  You live with, or have sex with, someone who has hepatitis B.  You are a man who has sex with other men (MSM).  You get hemodialysis treatment.  You take certain medicines for conditions like cancer, organ transplantation, and autoimmune conditions.  Hepatitis C blood testing is recommended for all people born from [birth year] through 1963 and any individual with known risk factors for hepatitis C.  Healthy men should no longer receive prostate-specific antigen (PSA) blood tests as part of routine cancer screening. Talk to your health care provider  about prostate cancer screening.  Testicular cancer screening is not recommended for adolescents or adult males who have no symptoms. Screening includes self-exam, a health care provider exam, and other screening tests. Consult with your health care provider about any symptoms you have or any concerns you have about testicular cancer.  Practice safe sex. Use condoms and avoid high-risk sexual practices to reduce the spread of sexually transmitted infections (STIs).  You should be screened for STIs, including gonorrhea and chlamydia if:  You are sexually active and are younger than 24 years.  You are older than 24 years, and your health care provider tells you that you are at risk for this type of infection.  Your sexual activity has changed since you were last screened, and you are at an increased risk for chlamydia or gonorrhea. Ask your health care provider if you are at risk.  If you are at risk of being infected with HIV,  it is recommended that you take a prescription medicine daily to prevent HIV infection. This is called pre-exposure prophylaxis (PrEP). You are considered at risk if:  You are a man who has sex with other men (MSM).  You are a heterosexual man who is sexually active with multiple partners.  You take drugs by injection.  You are sexually active with a partner who has HIV.  Talk with your health care provider about whether you are at high risk of being infected with HIV. If you choose to begin PrEP, you should first be tested for HIV. You should then be tested every 3 months for as long as you are taking PrEP.  Use sunscreen. Apply sunscreen liberally and repeatedly throughout the day. You should seek shade when your shadow is shorter than you. Protect yourself by wearing long sleeves, pants, a wide-brimmed hat, and sunglasses year round whenever you are outdoors.  Tell your health care provider of new moles or changes in moles, especially if there is a change in shape  or color. Also, tell your health care provider if a mole is larger than the size of a pencil eraser.  A one-time screening for abdominal aortic aneurysm (AAA) and surgical repair of large AAAs by ultrasound is recommended for men aged 5-75 years who are current or former smokers.  Stay current with your vaccines (immunizations). Document Released: 11/22/2007 Document Revised: 05/31/2013 Document Reviewed: 10/21/2010 [facility] Patient Information 2015 [place], [place]. This information is not intended to replace advice given to you by your health care provider. Make sure you discuss any questions you have with your health care provider.

## 2014-10-20 NOTE — Progress Notes (Signed)
Pre visit review using our clinic review tool, if applicable. No additional management support is needed unless otherwise documented below in the visit note.

## 2015-02-27 DIAGNOSIS — L570 Actinic keratosis: Secondary | ICD-10-CM | POA: Diagnosis not present

## 2015-02-27 DIAGNOSIS — L821 Other seborrheic keratosis: Secondary | ICD-10-CM | POA: Diagnosis not present

## 2015-02-27 DIAGNOSIS — D485 Neoplasm of uncertain behavior of skin: Secondary | ICD-10-CM | POA: Diagnosis not present

## 2015-03-28 DIAGNOSIS — H25811 Combined forms of age-related cataract, right eye: Secondary | ICD-10-CM | POA: Diagnosis not present

## 2015-03-28 DIAGNOSIS — H353131 Nonexudative age-related macular degeneration, bilateral, early dry stage: Secondary | ICD-10-CM | POA: Diagnosis not present

## 2015-03-28 DIAGNOSIS — H04123 Dry eye syndrome of bilateral lacrimal glands: Secondary | ICD-10-CM | POA: Diagnosis not present

## 2015-03-28 DIAGNOSIS — H02055 Trichiasis without entropian left lower eyelid: Secondary | ICD-10-CM | POA: Diagnosis not present

## 2015-04-11 ENCOUNTER — Ambulatory Visit (INDEPENDENT_AMBULATORY_CARE_PROVIDER_SITE_OTHER): Payer: Medicare Other | Admitting: Internal Medicine

## 2015-04-11 ENCOUNTER — Encounter: Payer: Self-pay | Admitting: Internal Medicine

## 2015-04-11 VITALS — BP 124/68 | HR 76 | Temp 97.8°F | Resp 18 | Ht 67.25 in | Wt 169.0 lb

## 2015-04-11 DIAGNOSIS — Z23 Encounter for immunization: Secondary | ICD-10-CM | POA: Diagnosis not present

## 2015-04-11 DIAGNOSIS — G250 Essential tremor: Secondary | ICD-10-CM | POA: Diagnosis not present

## 2015-04-11 DIAGNOSIS — M159 Polyosteoarthritis, unspecified: Secondary | ICD-10-CM

## 2015-04-11 DIAGNOSIS — E7800 Pure hypercholesterolemia, unspecified: Secondary | ICD-10-CM | POA: Diagnosis not present

## 2015-04-11 DIAGNOSIS — M150 Primary generalized (osteo)arthritis: Secondary | ICD-10-CM | POA: Diagnosis not present

## 2015-04-11 NOTE — Patient Instructions (Addendum)
Essential Tremor A tremor is trembling or shaking that you cannot control. Most tremors affect the hands or arms. Tremors can also affect the head, vocal cords, face, and other parts of the body.  Essential tremor is a tremor without a known cause.  CAUSES Essential tremor has no known cause.  RISK FACTORS You may be at greater risk of essential tremor if:   You have a family member with essential tremor.   You are age 79 or older.   You take certain medicines. SIGNS AND SYMPTOMS The main sign of a tremor is uncontrolled and unintentional rhythmic shaking of a body part.  You may have difficulty eating with a spoon or fork.   You may have difficulty writing.   You may nod your head up and down or side to side.   You may have a quivering voice.  Your tremors:  May get worse over time.   May come and go.   May be more noticeable on one side of your body.   May get worse due to stress, fatigue, caffeine, and extreme heat or cold.  DIAGNOSIS Your health care provider can diagnose essential tremor based on your symptoms, medical history, and a physical examination. There is no single test to diagnose an essential tremor. However, your health care provider may perform a variety of tests to rule out other conditions. Tests may include:   Blood and urine tests.   Imaging studies of your brain, such as:   CT scan.   MRI.   A test that measures involuntary muscle movement (electromyogram). TREATMENT Your tremors may go away without treatment. Mild tremors may not need treatment if they do not affect your day-to-day life. Severe tremors may need to be treated using one or a combination of the following options:   Medicines. This may include medicine that is injected.  Lifestyle changes.   Physical therapy.  HOME CARE INSTRUCTIONS  Take medicines only as directed by your health care provider.   Limit alcohol intake to no more than 1 drink per day for  nonpregnant women and 2 drinks per day for men. One drink equals 12 oz of beer, 5 oz of wine, or 1 oz of hard liquor.  Do not use any tobacco products, including cigarettes, chewing tobacco, or electronic cigarettes. If you need help quitting, ask your health care provider.  Take medicines only as directed by your health care provider.   Avoid extreme heat or cold.   Limit the amount of caffeine you consumeas directed by your health care provider.   Try to get eight hours of sleep each night.  Find ways to manage your stress, such as meditation or yoga.  Keep all follow-up visits as directed by your health care provider. This is important. This includes any physical therapy visits. SEEK MEDICAL CARE IF:  You experience any changes in the location or intensity of your tremors.   You start having a tremor after starting a new medicine.   You have tremor with other symptoms such as:   Numbness.   Tingling.   Pain.   Weakness.   Your tremor gets worse.   Your tremor interferes with your daily life.    This information is not intended to replace advice given to you by your health care provider. Make sure you discuss any questions you have with your health care provider.   Document Released: 06/16/2014 Document Reviewed: 06/16/2014 Elsevier Interactive Patient Education [employer].   Return in  6 months for your annual exam

## 2015-04-11 NOTE — Progress Notes (Signed)
Subjective:    Patient ID: Jason Summers, male    DOB: 01/05/1932, 81 y.o.   MRN: [id number]  HPI  81 year old patient who is seen today for his biannual follow-up.  He has a history of mild dyslipidemia, as well as osteoarthritis.  He is doing quite well. His only complaint is a tremor of the hands that has been noted for about one year.  This has worsened over the past few months. No family history of significant tremor  Past Medical History  Diagnosis Date  . Arthritis   . Spondylosis of cervical joint   . Lateral epicondylitis (tennis elbow)   . Hemorrhoids   . Cancer ([place])     basal cell on right temple    Social History   Social History  . Marital Status: Married    Spouse Name: N/A  . Number of Children: N/A  . Years of Education: N/A   Occupational History  . Retired     Social History Main Topics  . Smoking status: Former Smoker    Types: Cigarettes    Quit date: 03/09/2013  . Smokeless tobacco: Never Used     Comment: smokes 1 ciagettes occasionally  . Alcohol Use: 4.2 oz/week    7 Glasses of wine per week     Comment: 1-2 glasses of wine a day  . Drug Use: No  . Sexual Activity: Yes   Other Topics Concern  . Not on file   Social History Narrative    Past Surgical History  Procedure Laterality Date  . Colonoscopy  2003  . Knee arthroscopy  2006    right    Family History  Problem Relation Age of Onset  . Coronary artery disease    . Hypertension Mother   . Heart disease Mother     No Known Allergies  Current Outpatient Prescriptions on File Prior to Visit  Medication Sig Dispense Refill  . aspirin 81 MG EC tablet Take 81 mg by mouth daily.      . fish oil-omega-3 fatty acids 1000 MG capsule Take 2 g by mouth 2 (two) times daily.      Marland Kitchen glucosamine-chondroitin 500-400 MG tablet Take 1 tablet by mouth 2 (two) times daily.      Marland Kitchen LORazepam (ATIVAN) 0.5 MG tablet Take 1 tablet (0.5 mg total) by mouth 2 (two) times daily as needed for  anxiety. 30 tablet 2  . Naproxen Sodium (NAPRELAN) 750 MG TB24 Take 1 tablet (750 mg total) by mouth daily. 30 each 1  . omeprazole (PRILOSEC) 20 MG capsule Take 20 mg by mouth daily as needed.     No current facility-administered medications on file prior to visit.    BP 124/68 mmHg  Pulse 76  Temp(Src) 97.8 F (36.6 C) (Oral)  Resp 18  Ht 5' 7.25" (1.708 m)  Wt 169 lb (76.658 kg)  BMI 26.28 kg/m2     Review of Systems  Constitutional: Negative for fever, chills, appetite change and fatigue.  HENT: Negative for congestion, dental problem, ear pain, hearing loss, sore throat, tinnitus, trouble swallowing and voice change.   Eyes: Negative for pain, discharge and visual disturbance.  Respiratory: Negative for cough, chest tightness, wheezing and stridor.   Cardiovascular: Negative for chest pain, palpitations and leg swelling.  Gastrointestinal: Negative for nausea, vomiting, abdominal pain, diarrhea, constipation, blood in stool and abdominal distention.  Genitourinary: Negative for urgency, hematuria, flank pain, discharge, difficulty urinating and genital sores.  Musculoskeletal: Negative for myalgias,  back pain, joint swelling, arthralgias, gait problem and neck stiffness.  Skin: Negative for rash.  Neurological: Positive for tremors. Negative for dizziness, syncope, speech difficulty, weakness, numbness and headaches.  Hematological: Negative for adenopathy. Does not bruise/bleed easily.  Psychiatric/Behavioral: Negative for behavioral problems and dysphoric mood. The patient is not nervous/anxious.        Objective:   Physical Exam  Constitutional: He is oriented to person, place, and time. He appears well-developed.  HENT:  Head: Normocephalic.  Right Ear: External ear normal.  Left Ear: External ear normal.  Eyes: Conjunctivae and EOM are normal.  Neck: Normal range of motion.  Cardiovascular: Normal rate and normal heart sounds.   Pulmonary/Chest: Breath sounds  normal.  Abdominal: Bowel sounds are normal.  Musculoskeletal: Normal range of motion. He exhibits no edema or tenderness.  Neurological: He is alert and oriented to person, place, and time.  Very faint tremor of the outstretched hands  Psychiatric: He has a normal mood and affect. His behavior is normal.          Assessment & Plan:   Probable essential tremor, normal thyroid function studies.  5 months ago.  Moderate caffeine use and observe at this point will call for any clinical worsening, otherwise will reassess at the time of his annual exam in 6 months Osteoarthritis, stable  Preventive health.  Flu vaccine administered CPX 79 months

## 2015-04-11 NOTE — Progress Notes (Signed)
Pre visit review using our clinic review tool, if applicable. No additional management support is needed unless otherwise documented below in the visit note.

## 2015-05-18 ENCOUNTER — Telehealth: Payer: Self-pay | Admitting: Internal Medicine

## 2015-05-18 MED ORDER — LORAZEPAM 0.5 MG PO TABS: 0.5000 mg | ORAL_TABLET | Freq: Two times a day (BID) | ORAL | Status: DC | PRN

## 2015-05-18 NOTE — Telephone Encounter (Signed)
Rx was call in

## 2015-05-18 NOTE — Telephone Encounter (Signed)
Ok to rf

## 2015-05-18 NOTE — Telephone Encounter (Signed)
Pt last office visit 04/11/15 Pt last Rx refill 10/20/14 #30 with 2 refills   Please advise

## 2015-05-18 NOTE — Telephone Encounter (Signed)
Mrs. Jason Summers called saying the pt needs a refill of Lorazepam sent to his pharmacy. Please give them a call if you have questions.  Pt's ph# [phone] Thank you.

## 2015-05-23 DIAGNOSIS — J3089 Other allergic rhinitis: Secondary | ICD-10-CM | POA: Diagnosis not present

## 2015-05-23 DIAGNOSIS — H6123 Impacted cerumen, bilateral: Secondary | ICD-10-CM | POA: Diagnosis not present

## 2015-07-09 ENCOUNTER — Ambulatory Visit (INDEPENDENT_AMBULATORY_CARE_PROVIDER_SITE_OTHER): Payer: Medicare Other | Admitting: Internal Medicine

## 2015-07-09 ENCOUNTER — Telehealth: Payer: Self-pay | Admitting: Internal Medicine

## 2015-07-09 ENCOUNTER — Encounter: Payer: Self-pay | Admitting: Internal Medicine

## 2015-07-09 VITALS — BP 122/68 | HR 66 | Temp 98.6°F | Resp 28 | Ht 67.25 in | Wt 172.0 lb

## 2015-07-09 DIAGNOSIS — R0602 Shortness of breath: Secondary | ICD-10-CM | POA: Diagnosis not present

## 2015-07-09 DIAGNOSIS — E7800 Pure hypercholesterolemia, unspecified: Secondary | ICD-10-CM

## 2015-07-09 DIAGNOSIS — R079 Chest pain, unspecified: Secondary | ICD-10-CM | POA: Diagnosis not present

## 2015-07-09 LAB — COMPREHENSIVE METABOLIC PANEL
ALBUMIN: 4 g/dL (ref 3.5–5.2)
ALT: 10 U/L (ref 0–53)
AST: 14 U/L (ref 0–37)
Alkaline Phosphatase: 79 U/L (ref 39–117)
BILIRUBIN TOTAL: 0.3 mg/dL (ref 0.2–1.2)
BUN: 19 mg/dL (ref 6–23)
CALCIUM: 9 mg/dL (ref 8.4–10.5)
CHLORIDE: 107 meq/L (ref 96–112)
CO2: 27 meq/L (ref 19–32)
CREATININE: 0.83 mg/dL (ref 0.40–1.50)
GFR: 94.37 mL/min (ref >60.00)
Glucose, Bld: 87 mg/dL (ref 70–99)
Potassium: 4.2 mEq/L (ref 3.5–5.1)
SODIUM: 140 meq/L (ref 135–145)
Total Protein: 7 g/dL (ref 6.0–8.3)

## 2015-07-09 LAB — CBC WITH DIFFERENTIAL/PLATELET
BASOS ABS: 0 10*3/uL (ref 0.0–0.1)
BASOS PCT: 0.4 % (ref 0.0–3.0)
EOS ABS: 0.2 10*3/uL (ref 0.0–0.7)
Eosinophils Relative: 2.6 % (ref 0.0–5.0)
HCT: 43.3 % (ref 39.0–52.0)
Hemoglobin: 14.4 g/dL (ref 13.0–17.0)
LYMPHS PCT: 24.5 % (ref 12.0–46.0)
Lymphs Abs: 1.9 10*3/uL (ref 0.7–4.0)
MCHC: 33.2 g/dL (ref 30.0–36.0)
MCV: 89.2 fl (ref 78.0–100.0)
MONO ABS: 0.7 10*3/uL (ref 0.1–1.0)
Monocytes Relative: 8.8 % (ref 3.0–12.0)
NEUTROS ABS: 5 10*3/uL (ref 1.4–7.7)
NEUTROS PCT: 63.7 % (ref 43.0–77.0)
PLATELETS: 212 10*3/uL (ref 150.0–400.0)
RBC: 4.85 Mil/uL (ref 4.22–5.81)
RDW: 13.1 % (ref 11.5–15.5)
WBC: 7.8 10*3/uL (ref 4.0–10.5)

## 2015-07-09 NOTE — Telephone Encounter (Signed)
FYI 

## 2015-07-09 NOTE — Progress Notes (Signed)
Pre visit review using our clinic review tool, if applicable. No additional management support is needed unless otherwise documented below in the visit note.

## 2015-07-09 NOTE — Progress Notes (Signed)
Subjective:    Patient ID: Jason Summers, male    DOB: July 17, 1931, 82 y.o.   MRN: [id number]  HPI  82 year old patient who has a history of mild dyslipidemia but no cardiac history. He presents with a chief complaint of shortness of breath.  He describes this as a sense of needing take a deep breath and not getting in enough air.  He states this has occurred intermittently for years, but was quite severe over the weekend.  He denies any exertional chest pain or shortness of breath Yesterday while laying flat , he had a mild episode of left-sided chest discomfort.  He does have a history of dysphagia, but no true reflux symptoms.  He and his wife are quite concerned about these symptoms.  EKG reviewed today and revealed no acute changes.  A QS waves noted in lead V2 and a prior anteroseptal MI cannot be excluded  Past Medical History  Diagnosis Date  . Arthritis   . Spondylosis of cervical joint   . Lateral epicondylitis (tennis elbow)   . Hemorrhoids   . Cancer ([place])     basal cell on right temple    Social History   Social History  . Marital Status: Married    Spouse Name: N/A  . Number of Children: N/A  . Years of Education: N/A   Occupational History  . Retired     Social History Main Topics  . Smoking status: Former Smoker    Types: Cigarettes    Quit date: 03/09/2013  . Smokeless tobacco: Never Used     Comment: smokes 1 ciagettes occasionally  . Alcohol Use: 4.2 oz/week    7 Glasses of wine per week     Comment: 1-2 glasses of wine a day  . Drug Use: No  . Sexual Activity: Yes   Other Topics Concern  . Not on file   Social History Narrative    Past Surgical History  Procedure Laterality Date  . Colonoscopy  2003  . Knee arthroscopy  2006    right    Family History  Problem Relation Age of Onset  . Coronary artery disease    . Hypertension Mother   . Heart disease Mother     No Known Allergies  Current Outpatient Prescriptions on File Prior to  Visit  Medication Sig Dispense Refill  . aspirin 81 MG EC tablet Take 81 mg by mouth daily.      . fish oil-omega-3 fatty acids 1000 MG capsule Take 2 g by mouth 2 (two) times daily.      Marland Kitchen glucosamine-chondroitin 500-400 MG tablet Take 1 tablet by mouth 2 (two) times daily.      Marland Kitchen LORazepam (ATIVAN) 0.5 MG tablet Take 1 tablet (0.5 mg total) by mouth 2 (two) times daily as needed for anxiety. 30 tablet 2  . Naproxen Sodium (NAPRELAN) 750 MG TB24 Take 1 tablet (750 mg total) by mouth daily. (Patient taking differently: Take 750 mg by mouth as needed. ) 30 each 1  . omeprazole (PRILOSEC) 20 MG capsule Take 20 mg by mouth daily as needed.     No current facility-administered medications on file prior to visit.    BP 122/68 mmHg  Pulse 66  Temp(Src) 98.6 F (37 C) (Oral)  Resp 28  Ht 5' 7.25" (1.708 m)  Wt 172 lb (78.019 kg)  BMI 26.74 kg/m2  SpO2 97%     Review of Systems  Constitutional: Negative for fever, chills, appetite change and  fatigue.  HENT: Negative for congestion, dental problem, ear pain, hearing loss, sore throat, tinnitus, trouble swallowing and voice change.   Eyes: Negative for pain, discharge and visual disturbance.  Respiratory: Positive for chest tightness and shortness of breath. Negative for cough, wheezing and stridor.   Cardiovascular: Negative for chest pain, palpitations and leg swelling.  Gastrointestinal: Negative for nausea, vomiting, abdominal pain, diarrhea, constipation, blood in stool and abdominal distention.  Genitourinary: Negative for urgency, hematuria, flank pain, discharge, difficulty urinating and genital sores.  Musculoskeletal: Negative for myalgias, back pain, joint swelling, arthralgias, gait problem and neck stiffness.  Skin: Negative for rash.  Neurological: Negative for dizziness, syncope, speech difficulty, weakness, numbness and headaches.  Hematological: Negative for adenopathy. Does not bruise/bleed easily.    Psychiatric/Behavioral: Negative for behavioral problems and dysphoric mood. The patient is not nervous/anxious.        Objective:   Physical Exam  Constitutional: He appears well-developed and well-nourished.  HENT:  Head: Normocephalic and atraumatic.  Right Ear: External ear normal.  Left Ear: External ear normal.  Nose: Nose normal.  Mouth/Throat: Oropharynx is clear and moist.  Eyes: Conjunctivae and EOM are normal. Pupils are equal, round, and reactive to light. No scleral icterus.  Neck: Normal range of motion. Neck supple. No JVD present. No thyromegaly present.  Cardiovascular: Regular rhythm, normal heart sounds and intact distal pulses.  Exam reveals no gallop and no friction rub.   No murmur heard. Pulmonary/Chest: Effort normal and breath sounds normal. No respiratory distress. He has no wheezes. He has no rales. He exhibits no tenderness.  O2 saturation 97  Abdominal: Soft. Bowel sounds are normal. He exhibits no distension and no mass. There is no tenderness.  Genitourinary: Prostate normal and penis normal.  Musculoskeletal: Normal range of motion. He exhibits no edema or tenderness.  Lymphadenopathy:    He has no cervical adenopathy.  Neurological: He is alert. He has normal reflexes. No cranial nerve deficit. Coordination normal.  Skin: Skin is warm and dry. No rash noted.  Psychiatric: He has a normal mood and affect. His behavior is normal.          Assessment & Plan:   Nonspecific dyspnea and chest pain.  Will check some updated lab.  Patient is symptom-free today, although quite anxious History of dyslipidemia History dysphagia

## 2015-07-09 NOTE — Patient Instructions (Signed)
Limit your sodium (Salt) intake  Call or return to clinic prn if these symptoms worsen or fail to improve as anticipated.

## 2015-07-09 NOTE — Telephone Encounter (Signed)
Patient Name: TOMMIE BOHLKEN  DOB: 1932-05-30    Initial Comment Caller states her husband is having some trouble breathing.   Nurse Assessment  Nurse: Orvan Seen, RN, Jacquilin Date/Time (Eastern Time): 07/09/2015 8:41:59 AM  Confirm and document reason for call. If symptomatic, describe symptoms. You must click the next button to save text entered. ---Caller states her husband is having some trouble breathing (off and on for 1+ week). Joint pain. Caller states the patient has this "trouble" in the past but it doesn't usually last this long. Denies any chest pain. Patient is still able to do his daily tasks. Caller states the patient does not have a fever or a cough.  Has the patient traveled out of the country within the last 30 days? ---No  Does the patient have any new or worsening symptoms? ---Yes  Will a triage be completed? ---Yes  Related visit to physician within the last 2 weeks? ---No  Does the PT have any chronic conditions? (i.e. diabetes, asthma, etc.) ---Yes  List chronic conditions. ---Arthritis, Cancer.  Is this a behavioral health or substance abuse call? ---No     Guidelines    Guideline Title Affirmed Question Affirmed Notes  Breathing Difficulty [1] MODERATE longstanding difficulty breathing (e.g., speaks in phrases, SOB even at rest, pulse 100-120) AND [2] SAME as normal    Final Disposition User   See PCP When Office is Open (within 3 days) Orvan Seen, RN, Jacquilin    Comments  Appointment made for today at 1430 with PCP   Referrals  REFERRED TO PCP OFFICE   Disagree/Comply: Comply

## 2015-07-10 LAB — D-DIMER, QUANTITATIVE: D-Dimer, Quant: 0.55 ug/mL-FEU — ABNORMAL HIGH (ref 0.00–0.48)

## 2015-07-11 ENCOUNTER — Telehealth: Payer: Self-pay | Admitting: Internal Medicine

## 2015-07-11 NOTE — Telephone Encounter (Signed)
Pt would like blood work results

## 2015-07-12 NOTE — Telephone Encounter (Signed)
Pt calling for lab results, please advise.

## 2015-07-12 NOTE — Telephone Encounter (Signed)
Please call/notify patient that lab/test/procedure is normal.  Please check on her clinical status

## 2015-07-12 NOTE — Telephone Encounter (Signed)
Spoke to pt's wife Jason Summers, due pt was resting. Told her his lab results were normal per Dr.K. Jason Summers verbalized understanding and will let him know. Asked Jason Summers how pt was? She said a little better. Told her okay continue to monitor and if he does not seem to be getting better to contact the office. Jason Summers verbalized understanding.

## 2015-08-06 ENCOUNTER — Ambulatory Visit: Payer: Medicare Other | Admitting: Internal Medicine

## 2015-08-19 NOTE — Progress Notes (Signed)
Patient ID: Jason Summers, male   DOB: Mar 13, 1932, 82 y.o.   MRN: [id number]     Cardiology Office Note   Date:  08/21/2015   ID:  Jason Summers, DOB 1931-10-08, MRN [id number]  PCP:  [name], MD  Cardiologist:   Jenkins Rouge, MD   Chief Complaint  Patient presents with  . Establish Care    when lays on left side feels pressure on chest & some "catching breath feeling" , per pt      History of Present Illness: [name] is a 82 y.o. male who presents for evaluation of chest pain and dyspnea. History of elevated lipids but no cardiac history . Saw Dr Raliegh Ip end of January and had exertional dyspnea and tightness in chest  No real w/u done Ddimer was mildly elevated .55  Apparently I did cath on him over 15 years ago for abnormal stress test done by Dr Arnoldo Morale and it was normal.  He appears to have been ill for 3 days ? URI with pleuritic type pain. No sputum, fever, LE edema or leg pain.  Feels back to baseline now.  Sleeps well with no PND/ or orthopnea   He is originally from [place].  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  I know his son Sheran Fava well Very active will travel back to [place] in May.  No current chest pain    Past Medical History  Diagnosis Date  . Arthritis   . Spondylosis of cervical joint   . Lateral epicondylitis (tennis elbow)   . Hemorrhoids   . Cancer ([place])     basal cell on right temple    Past Surgical History  Procedure Laterality Date  . Colonoscopy  2003  . Knee arthroscopy  2006    right     Current Outpatient Prescriptions  Medication Sig Dispense Refill  . aspirin 81 MG EC tablet Take 81 mg by mouth every other day.     . fish oil-omega-3 fatty acids 1000 MG capsule Take 2 g by mouth 2 (two) times daily.      Marland Kitchen glucosamine-chondroitin 500-400 MG tablet Take 1 tablet by mouth 2 (two) times daily.      Marland Kitchen LORazepam (ATIVAN) 0.5 MG tablet Take 1 tablet (0.5 mg total) by mouth 2 (two) times  daily as needed for anxiety. 30 tablet 2  . omeprazole (PRILOSEC) 20 MG capsule Take 20 mg by mouth as needed (heartburn).      No current facility-administered medications for this visit.    Allergies:   Review of patient's allergies indicates no known allergies.    Social History:  The patient  reports that he quit smoking about 2 years ago. His smoking use included Cigarettes. He has never used smokeless tobacco. He reports that he drinks about 4.2 oz of alcohol per week. He reports that he does not use illicit drugs.   Family History:  The patient's family history includes Heart disease in his mother; Hypertension in his mother.    ROS:  Please see the history of present illness.   Otherwise, review of systems are positive for none.   All other systems are reviewed and negative.    PHYSICAL EXAM: VS:  BP 110/52 mmHg  Pulse 65  Ht _0  (1.778 m)  Wt 77.62 kg (171 lb 1.9 oz)  BMI 24.55 kg/m2  SpO2 96% , BMI Body mass index is 24.55 kg/(m^2). Affect appropriate Healthy:  appears stated age 2: normal Neck supple with no adenopathy JVP normal no bruits no thyromegaly Lungs clear with no wheezing and good diaphragmatic motion Heart:  S1/S2 no murmur, no rub, gallop or click PMI normal Abdomen: benighn, BS positve, no tenderness, no AAA no bruit.  No HSM or HJR Distal pulses intact with no bruits No edema Neuro non-focal Skin warm and dry No muscular weakness    EKG:   07/09/15  SR PVC no acute ST/T wave changes    Recent Labs: 10/20/2014: TSH 1.31 07/09/2015: ALT 10; BUN 19; Creatinine, Ser 0.83; Hemoglobin 14.4; Platelets 212.0; Potassium 4.2; Sodium 140    Lipid Panel    Component Value Date/Time   CHOL 192 10/20/2014 0957   TRIG 104.0 10/20/2014 0957   HDL 61.60 10/20/2014 0957   CHOLHDL 3 10/20/2014 0957   VLDL 20.8 10/20/2014 0957   LDLCALC 110* 10/20/2014 0957   LDLDIRECT 118.5 08/21/2011 1125      Wt Readings from Last 3 Encounters:  08/21/15  77.62 kg (171 lb 1.9 oz)  07/09/15 78.019 kg (172 lb)  04/11/15 76.658 kg (169 lb)      Other studies Reviewed: Additional studies/ records that were reviewed today include: Epic notes Dr Raliegh Ip and ECG/labs .    ASSESSMENT AND PLAN:  1.  Dyspnea:  Fairly well resolved May have had URI with pleurisy.  Will check CXR and echo as well as repeat d dimer / BNP.  Exam is normal 2. Chol:   Cholesterol is at goal.  Continue current dose of statin and diet Rx.  No myalgias or side effects.  F/U  LFT's in 6 months. Lab Results  Component Value Date   LDLCALC 110* 10/20/2014            3. GERD:  Continue Prilosec    Current medicines are reviewed at length with the patient today.  The patient does not have concerns regarding medicines.  The following changes have been made:  no change  Labs/ tests ordered today include:  D dimer, BNP Echo and CXR  Orders Placed This Encounter  Procedures  . DG Chest 2 View  . Hemoglobin  . D-Dimer, Quantitative  . Brain natriuretic peptide  . Echocardiogram     Disposition:   FU with next available      Signed, Jenkins Rouge, MD  08/21/2015 12:21 PM    [facility] [address], [place], [place]  [address] Phone: [phone]; Fax: [phone]

## 2015-08-20 ENCOUNTER — Encounter: Payer: Self-pay | Admitting: *Deleted

## 2015-08-21 ENCOUNTER — Ambulatory Visit
Admission: RE | Admit: 2015-08-21 | Discharge: 2015-08-21 | Disposition: A | Discharge status: Home / Self Care | Payer: Medicare Other | Source: Ambulatory Visit | Attending: Cardiovascular Disease | Admitting: Cardiovascular Disease

## 2015-08-21 ENCOUNTER — Encounter: Payer: Self-pay | Admitting: Cardiovascular Disease

## 2015-08-21 ENCOUNTER — Ambulatory Visit (INDEPENDENT_AMBULATORY_CARE_PROVIDER_SITE_OTHER): Payer: Medicare Other | Admitting: Cardiovascular Disease

## 2015-08-21 VITALS — BP 110/52 | HR 65 | Ht 70.0 in | Wt 171.1 lb

## 2015-08-21 DIAGNOSIS — R0600 Dyspnea, unspecified: Secondary | ICD-10-CM | POA: Diagnosis not present

## 2015-08-21 DIAGNOSIS — Z7689 Persons encountering health services in other specified circumstances: Secondary | ICD-10-CM

## 2015-08-21 DIAGNOSIS — Z7189 Other specified counseling: Secondary | ICD-10-CM

## 2015-08-21 LAB — HEMOGLOBIN: Hemoglobin: 13.9 g/dL (ref 13.0–17.0)

## 2015-08-21 NOTE — Patient Instructions (Addendum)
Medication Instructions:  Your physician recommends that you continue on your current medications as directed. Please refer to the Current Medication list given to you today.  Labwork: Your physician recommends that you have lab work today: Hgb, D-dimer, BNP   Testing/Procedure: A chest x-ray takes a picture of the organs and structures inside the chest, including the heart, lungs, and blood vessels. This test can show several things, including, whether the heart is enlarges; whether fluid is building up in the lungs; and whether pacemaker / defibrillator leads are still in place. At [facility]  Your physician has requested that you have an echocardiogram. Echocardiography is a painless test that uses sound waves to create images of your heart. It provides your doctor with information about the size and shape of your heart and how well your heart's chambers and valves are working. This procedure takes approximately one hour. There are no restrictions for this procedure.   Follow-Up: Your physician recommends that you schedule a follow-up appointment next available with Dr. Johnsie Cancel.   If you need a refill on your cardiac medications before your next appointment, please call your pharmacy.

## 2015-08-22 ENCOUNTER — Telehealth: Payer: Self-pay

## 2015-08-22 ENCOUNTER — Other Ambulatory Visit (INDEPENDENT_AMBULATORY_CARE_PROVIDER_SITE_OTHER): Payer: Medicare Other | Admitting: *Deleted

## 2015-08-22 DIAGNOSIS — I2699 Other pulmonary embolism without acute cor pulmonale: Secondary | ICD-10-CM

## 2015-08-22 DIAGNOSIS — Z01812 Encounter for preprocedural laboratory examination: Secondary | ICD-10-CM | POA: Diagnosis not present

## 2015-08-22 DIAGNOSIS — R7989 Other specified abnormal findings of blood chemistry: Secondary | ICD-10-CM

## 2015-08-22 LAB — D-DIMER, QUANTITATIVE: D-Dimer, Quant: 0.63 ug/mL-FEU — ABNORMAL HIGH (ref 0.00–0.48)

## 2015-08-22 LAB — BRAIN NATRIURETIC PEPTIDE: Brain Natriuretic Peptide: 52 pg/mL (ref <100)

## 2015-08-22 NOTE — Telephone Encounter (Signed)
-----  Message from Josue Hector, MD sent at 08/22/2015  1:16 PM EDT ----- BNP normal D dimer still up needs CTA to r/o PE

## 2015-08-22 NOTE — Telephone Encounter (Signed)
Called patient about lab results. Per Dr. Johnsie Cancel, BNP normal D-dimer still up needs CTA to r/o PE. Will have patient come in to have CT at our office in the morning. Patient needs BMET before CT, and will come in this afternoon. Patient verbalized understanding.

## 2015-08-23 ENCOUNTER — Telehealth: Payer: Self-pay | Admitting: Nurse Practitioner

## 2015-08-23 ENCOUNTER — Ambulatory Visit (INDEPENDENT_AMBULATORY_CARE_PROVIDER_SITE_OTHER)
Admission: RE | Admit: 2015-08-23 | Discharge: 2015-08-23 | Disposition: A | Discharge status: Home / Self Care | Payer: Medicare Other | Source: Ambulatory Visit | Attending: Cardiovascular Disease | Admitting: Cardiovascular Disease

## 2015-08-23 ENCOUNTER — Telehealth: Payer: Self-pay | Admitting: Internal Medicine

## 2015-08-23 DIAGNOSIS — R791 Abnormal coagulation profile: Secondary | ICD-10-CM

## 2015-08-23 DIAGNOSIS — R7989 Other specified abnormal findings of blood chemistry: Secondary | ICD-10-CM

## 2015-08-23 DIAGNOSIS — D491 Neoplasm of unspecified behavior of respiratory system: Secondary | ICD-10-CM

## 2015-08-23 LAB — BASIC METABOLIC PANEL
BUN: 19 mg/dL (ref 7–25)
CALCIUM: 8.6 mg/dL (ref 8.6–10.3)
CO2: 25 mmol/L (ref 20–31)
CREATININE: 0.84 mg/dL (ref 0.70–1.11)
Chloride: 103 mmol/L (ref 98–110)
GLUCOSE: 102 mg/dL — AB (ref 65–99)
Potassium: 4.2 mmol/L (ref 3.5–5.3)
Sodium: 135 mmol/L (ref 135–146)

## 2015-08-23 MED ORDER — IOHEXOL 350 MG/ML SOLN
80.0000 mL | Freq: Once | INTRAVENOUS | Status: AC | PRN
  Administered 2015-08-23: 80 mL via INTRAVENOUS

## 2015-08-23 NOTE — Telephone Encounter (Signed)
Bevelyn Ngo- is it possible to schedule a visit with Dr. Raliegh Ip next week to discuss results of the CT scan? I would say that is a reasonable next step. He may want the PET scan before referring to either of the listed consultations. May be a oncology referral or may need referral for tissue biopsy- this is not as straight forward as just referring. Let's set up visit with Dr. Raliegh Ip to discuss

## 2015-08-23 NOTE — Telephone Encounter (Signed)
Sharyn Lull called from [facility] [phone]) about abnormal chest CT from radiology. The results showed a neo-plasm. Jenkins Rouge ordered the CT but is out the office and Dr. Tamala Julian advised to order a PET scan. Sharyn Lull just needs advise to know what to tell the patient as far as a referral to Pulmonary or to Thoracic.

## 2015-08-23 NOTE — Telephone Encounter (Signed)
See below.

## 2015-08-23 NOTE — Telephone Encounter (Signed)
Received call from [place] regarding an abnormal finding on patient's CTA ordered by Dr. Johnsie Cancel.  Dr. Johnsie Cancel is out of the office until next week so results reviewed with Dr. Tamala Julian, DOD.  He advised that we order PET scan for better evaluation of lung neoplasm.  I called and spoke with Amber in radiology at [facility] and she advised which PET to order.  I have placed order and sent message to pre-cert.  I called and reviewed results with patient and his wife.  They are thankful for the call and would like the test done as soon as possible due to an upcoming trip.  I advised that per Amber, the test could possibly be scheduled for the end of next week.  They thanked me for the call.

## 2015-08-23 NOTE — Telephone Encounter (Signed)
Pt scheduled and aware of appointment with Dr. Raliegh Ip.

## 2015-08-23 NOTE — Telephone Encounter (Signed)
See below [place] and schedule pt with Dr. Raliegh Ip per Dr. Yong Channel.

## 2015-08-27 ENCOUNTER — Ambulatory Visit (INDEPENDENT_AMBULATORY_CARE_PROVIDER_SITE_OTHER): Payer: Medicare Other | Admitting: Internal Medicine

## 2015-08-27 ENCOUNTER — Encounter: Payer: Self-pay | Admitting: Internal Medicine

## 2015-08-27 ENCOUNTER — Telehealth: Payer: Self-pay

## 2015-08-27 ENCOUNTER — Telehealth: Payer: Self-pay | Admitting: Cardiovascular Disease

## 2015-08-27 VITALS — BP 120/68 | HR 70 | Temp 98.5°F | Resp 20 | Ht 70.0 in | Wt 171.0 lb

## 2015-08-27 DIAGNOSIS — R599 Enlarged lymph nodes, unspecified: Secondary | ICD-10-CM | POA: Diagnosis not present

## 2015-08-27 DIAGNOSIS — R590 Localized enlarged lymph nodes: Secondary | ICD-10-CM

## 2015-08-27 DIAGNOSIS — R918 Other nonspecific abnormal finding of lung field: Secondary | ICD-10-CM | POA: Diagnosis not present

## 2015-08-27 DIAGNOSIS — R9389 Abnormal findings on diagnostic imaging of other specified body structures: Secondary | ICD-10-CM

## 2015-08-27 HISTORY — DX: Other nonspecific abnormal finding of lung field: R91.8

## 2015-08-27 HISTORY — DX: Localized enlarged lymph nodes: R59.0

## 2015-08-27 NOTE — Telephone Encounter (Signed)
New message   Is it ok for the pt to see Dr. Brand Males. Pt would like a call back to discuss being that Dr. Elsworth Soho is not available

## 2015-08-27 NOTE — Telephone Encounter (Signed)
Called patient back. Informed patient that any of the pulmonologist are fine to see. When scheduling referral the doctor at the top of the list was picked, and the list is alphabetical. Informed patient that it is better to see someone sooner than later.

## 2015-08-27 NOTE — Progress Notes (Signed)
Subjective:    Patient ID: Jason Summers, male    DOB: [birth year], 82 y.o.   MRN: [id number]  HPI  82 year old patient who was seen in January with  nonexertional chest discomfort, worse on the left side.  He also had vague complaints of some dyspnea and air hunger but no real DOE. He was seen by cardiology last week and evaluation included a d-dimer which was slightly elevated.  A subsequent chest CTA was performed which was negative for pulmonary emboli but revealed pulmonary nodules and mediastinal lymphadenopathy.  The patient has been scheduled for a PET scan as well as pulmonary consultation for consideration of bronchoscopy He continues to have mild left-sided chest discomfort especially landed on the left side.  No complaints include some nonspecific headaches in the frontal and temporal area that had been present for the past 2 weeks.  He has had similar headaches in the past that have responded to ibuprofen but these headaches have been more persistent and frequent. Former tobacco user.  Discontinued in October 2014  Wt Readings from Last 3 Encounters:  08/27/15 171 lb (77.565 kg)  08/21/15 171 lb 1.9 oz (77.62 kg)  07/09/15 172 lb (78.019 kg)    Past Medical History  Diagnosis Date  . Arthritis   . Spondylosis of cervical joint   . Lateral epicondylitis (tennis elbow)   . Hemorrhoids   . Cancer ([place])     basal cell on right temple    Social History   Social History  . Marital Status: Married    Spouse Name: N/A  . Number of Children: N/A  . Years of Education: N/A   Occupational History  . Retired     Social History Main Topics  . Smoking status: Former Smoker    Types: Cigarettes    Quit date: 03/09/2013  . Smokeless tobacco: Never Used     Comment: smokes 1 ciagettes occasionally  . Alcohol Use: 4.2 oz/week    7 Glasses of wine per week     Comment: 1-2 glasses of wine a day  . Drug Use: No  . Sexual Activity: Yes   Other Topics Concern  . Not on file     Social History Narrative    Past Surgical History  Procedure Laterality Date  . Colonoscopy  2003  . Knee arthroscopy  2006    right    Family History  Problem Relation Age of Onset  . Coronary artery disease    . Hypertension Mother   . Heart disease Mother     No Known Allergies  Current Outpatient Prescriptions on File Prior to Visit  Medication Sig Dispense Refill  . aspirin 81 MG EC tablet Take 81 mg by mouth every other day.     . fish oil-omega-3 fatty acids 1000 MG capsule Take 2 g by mouth 2 (two) times daily.      Marland Kitchen glucosamine-chondroitin 500-400 MG tablet Take 1 tablet by mouth 2 (two) times daily.      Marland Kitchen LORazepam (ATIVAN) 0.5 MG tablet Take 1 tablet (0.5 mg total) by mouth 2 (two) times daily as needed for anxiety. 30 tablet 2  . omeprazole (PRILOSEC) 20 MG capsule Take 20 mg by mouth as needed (heartburn).      No current facility-administered medications on file prior to visit.    BP 120/68 mmHg  Pulse 70  Temp(Src) 98.5 F (36.9 C) (Oral)  Resp 20  Ht 5' 10" (1.778 m)  Wt 171 lb (77.565  kg)  BMI 24.54 kg/m2  SpO2 98%     Review of Systems  Constitutional: Negative for fever, chills, appetite change and fatigue.  HENT: Negative for congestion, dental problem, ear pain, hearing loss, sore throat, tinnitus, trouble swallowing and voice change.   Eyes: Positive for redness. Negative for pain, discharge and visual disturbance.  Respiratory: Positive for shortness of breath. Negative for cough, chest tightness, wheezing and stridor.   Cardiovascular: Positive for chest pain. Negative for palpitations and leg swelling.  Gastrointestinal: Negative for nausea, vomiting, abdominal pain, diarrhea, constipation, blood in stool and abdominal distention.  Genitourinary: Negative for urgency, hematuria, flank pain, discharge, difficulty urinating and genital sores.  Musculoskeletal: Negative for myalgias, back pain, joint swelling, arthralgias, gait problem  and neck stiffness.  Skin: Negative for rash.  Neurological: Positive for headaches. Negative for dizziness, syncope, speech difficulty, weakness and numbness.  Hematological: Negative for adenopathy. Does not bruise/bleed easily.  Psychiatric/Behavioral: Negative for behavioral problems and dysphoric mood. The patient is not nervous/anxious.        Objective:   Physical Exam  Constitutional: He is oriented to person, place, and time. He appears well-developed.  Anxious but in no acute distress  HENT:  Head: Normocephalic.  Right Ear: External ear normal.  Left Ear: External ear normal.  Eyes: Conjunctivae and EOM are normal.  Conjunctival injection bilaterally  Neck: Normal range of motion.  No cervical, supraclavicular or axillary adenopathy  Cardiovascular: Normal rate and normal heart sounds.   Pulmonary/Chest: Breath sounds normal. No respiratory distress. He has no wheezes.  O2 saturation 98  Abdominal: Bowel sounds are normal.  Musculoskeletal: Normal range of motion. He exhibits no edema or tenderness.  Neurological: He is alert and oriented to person, place, and time.  Psychiatric: He has a normal mood and affect. His behavior is normal.          Assessment & Plan:   Pulmonary nodules with mediastinal lymphadenopathy.  Workup in progress.  Patient has been scheduled for a PET scan as well as pulmonary consultation for bronchoscopy and biopsy  Return in one month for follow-up.  We'll discuss all results with patient as they become available

## 2015-08-27 NOTE — Telephone Encounter (Signed)
Per Dr. Johnsie Cancel, referral to pulmonology as soon as possible.  CT results- Pulmonary nodular lesions on the left with adenopathy. Neoplasm is of concern. There is a tiny cavitation in the lingular nodular lesion. These findings may warrant correlation with nuclear medicine PET study to further evaluate.  Patient is aware of referral. Informed patient that pulmonology will be calling to schedule an appointment.  Encouraged patient to call with any questions or concerns.

## 2015-08-27 NOTE — Patient Instructions (Signed)
PET Scan as scheduled Pulmonary consultation  Return in one month for follow-up or sooner if needed

## 2015-08-27 NOTE — Progress Notes (Signed)
Pre visit review using our clinic review tool, if applicable. No additional management support is needed unless otherwise documented below in the visit note.

## 2015-08-30 ENCOUNTER — Telehealth: Payer: Self-pay | Admitting: Cardiovascular Disease

## 2015-08-30 NOTE — Telephone Encounter (Signed)
Informed patient's wife that these test are looking at two different things, and patient would still need the echocardiogram. Patient's wife verbalized understanding.

## 2015-08-30 NOTE — Telephone Encounter (Signed)
Dr. Johnsie Cancel ordered echo-scheduled 09-06-15, since then Dr. Raliegh Ip has ordered a PET scan 09-05-15 , wife calling to see if he still needs echo--pls advise _[phone]

## 2015-09-05 ENCOUNTER — Ambulatory Visit (HOSPITAL_COMMUNITY)
Admission: RE | Admit: 2015-09-05 | Discharge: 2015-09-05 | Disposition: A | Discharge status: Home / Self Care | Payer: Medicare Other | Source: Ambulatory Visit | Attending: Cardiovascular Disease | Admitting: Cardiovascular Disease

## 2015-09-05 ENCOUNTER — Telehealth: Payer: Self-pay | Admitting: Cardiovascular Disease

## 2015-09-05 DIAGNOSIS — N329 Bladder disorder, unspecified: Secondary | ICD-10-CM | POA: Insufficient documentation

## 2015-09-05 DIAGNOSIS — C771 Secondary and unspecified malignant neoplasm of intrathoracic lymph nodes: Secondary | ICD-10-CM | POA: Diagnosis not present

## 2015-09-05 DIAGNOSIS — Z87891 Personal history of nicotine dependence: Secondary | ICD-10-CM | POA: Insufficient documentation

## 2015-09-05 DIAGNOSIS — R918 Other nonspecific abnormal finding of lung field: Secondary | ICD-10-CM | POA: Insufficient documentation

## 2015-09-05 DIAGNOSIS — C801 Malignant (primary) neoplasm, unspecified: Secondary | ICD-10-CM | POA: Diagnosis not present

## 2015-09-05 DIAGNOSIS — E785 Hyperlipidemia, unspecified: Secondary | ICD-10-CM | POA: Insufficient documentation

## 2015-09-05 DIAGNOSIS — D491 Neoplasm of unspecified behavior of respiratory system: Secondary | ICD-10-CM | POA: Insufficient documentation

## 2015-09-05 DIAGNOSIS — R0600 Dyspnea, unspecified: Secondary | ICD-10-CM | POA: Diagnosis not present

## 2015-09-05 LAB — GLUCOSE, CAPILLARY: Glucose-Capillary: 103 mg/dL — ABNORMAL HIGH (ref 65–99)

## 2015-09-05 MED ORDER — FLUDEOXYGLUCOSE F - 18 (FDG) INJECTION
8.5400 | Freq: Once | INTRAVENOUS | Status: AC | PRN
  Administered 2015-09-05: 8.54 via INTRAVENOUS

## 2015-09-05 NOTE — Telephone Encounter (Signed)
F/u  Pt wife returning RN phon ecall. Please call back and discuss.

## 2015-09-05 NOTE — Telephone Encounter (Signed)
Returned call about PET scan results.

## 2015-09-06 ENCOUNTER — Ambulatory Visit (HOSPITAL_BASED_OUTPATIENT_CLINIC_OR_DEPARTMENT_OTHER): Payer: Medicare Other

## 2015-09-06 ENCOUNTER — Other Ambulatory Visit: Payer: Self-pay

## 2015-09-06 DIAGNOSIS — R0600 Dyspnea, unspecified: Secondary | ICD-10-CM

## 2015-09-06 DIAGNOSIS — D491 Neoplasm of unspecified behavior of respiratory system: Secondary | ICD-10-CM | POA: Diagnosis not present

## 2015-09-06 DIAGNOSIS — Z7189 Other specified counseling: Secondary | ICD-10-CM

## 2015-09-06 DIAGNOSIS — Z7689 Persons encountering health services in other specified circumstances: Secondary | ICD-10-CM

## 2015-09-07 ENCOUNTER — Encounter: Payer: Self-pay | Admitting: Internal Medicine

## 2015-09-07 ENCOUNTER — Ambulatory Visit (INDEPENDENT_AMBULATORY_CARE_PROVIDER_SITE_OTHER): Payer: Medicare Other | Admitting: Internal Medicine

## 2015-09-07 VITALS — BP 124/70 | HR 72 | Ht 68.5 in | Wt 171.0 lb

## 2015-09-07 DIAGNOSIS — R918 Other nonspecific abnormal finding of lung field: Secondary | ICD-10-CM | POA: Insufficient documentation

## 2015-09-07 DIAGNOSIS — R590 Localized enlarged lymph nodes: Secondary | ICD-10-CM | POA: Insufficient documentation

## 2015-09-07 DIAGNOSIS — IMO0001 Reserved for inherently not codable concepts without codable children: Secondary | ICD-10-CM | POA: Insufficient documentation

## 2015-09-07 DIAGNOSIS — J984 Other disorders of lung: Secondary | ICD-10-CM | POA: Diagnosis not present

## 2015-09-07 DIAGNOSIS — R599 Enlarged lymph nodes, unspecified: Secondary | ICD-10-CM | POA: Diagnosis not present

## 2015-09-07 HISTORY — DX: Reserved for inherently not codable concepts without codable children: IMO0001

## 2015-09-07 HISTORY — DX: Other nonspecific abnormal finding of lung field: R91.8

## 2015-09-07 HISTORY — DX: Localized enlarged lymph nodes: R59.0

## 2015-09-07 NOTE — Progress Notes (Signed)
Subjective:    Patient ID: Jason Summers, male    DOB: [birth year], 82 y.o.   MRN: [id number] PCP [name], MD  HPI  IOV 09/07/2015  Chief Complaint  Patient presents with  . Pulmonary Consult    Referred by Dr. Johnsie Cancel - abnormal CT Chest.  Has pressure on left chest with deep inhalations.  No cough.       Jason Summers is  A 82 y.o. male is referred for pulmonary nodule/mass and also medially similar adenopathy. His smoking the past but only as a limited smoking history. He is an immigrant from [place]. 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 He now lives in [place]. His son lives nearby and apparently lives in the neighborhood. He presents with his wife [place]. He has been referred by cardiologist Dr. Leonides Sake. He has been having some atypical chest pain in the left precordial area. According to him and his wife this resulted in a d-dimer test that was slightly elevated. This resulted in a CT anginal chest that ruled out pulmonary embolism. I personally visualized the film. This then resulted in PET scan done 09/05/2015 that I personally visualized that shows a large left lower lobe subpleural mass along with a small lingular subpleural cavitating lesion that coincides with the area of the pain and also had heart mediastinal lymphatic adenopathy in the AP window. In addition there is a low uptake enlargement of pre-carinal and subcarinal lymphadenopathy. There is some weight loss history. He and his wife actually worried that this is lung cancer. His wife is in tears.   he is otherwise healthy. Chemistry evaluation 08/22/2015 was normal. His only blood thinner his aspirin daily. Echocardiogram yesterday 09/06/2015 was normal with ejection fraction 60%      has a past medical history of Arthritis; Spondylosis of cervical joint; Lateral epicondylitis (tennis elbow); Hemorrhoids; and Cancer ([place]).   reports  that he quit smoking about 4 years ago. His smoking use included Cigarettes. He has a 12.4 pack-year smoking history. He has never used smokeless tobacco.  Past Surgical History  Procedure Laterality Date  . Colonoscopy  2003  . Knee arthroscopy  2006    right    No Known Allergies  Immunization History  Administered Date(s) Administered  . Influenza Split 03/19/2011, 03/12/2012  . Influenza Whole 06/09/1997, 04/22/2007, 03/14/2008, 03/28/2009, 05/09/2010  . Influenza, High Dose Seasonal PF 04/14/2013, 04/11/2015  . Influenza-Unspecified 04/10/2014  . Pneumococcal Conjugate-13 10/14/2013  . Pneumococcal Polysaccharide-23 06/09/2004, 10/04/2012  . Td 06/09/2005  . Zoster 02/04/2015    Family History  Problem Relation Age of Onset  . Coronary artery disease    . Hypertension Mother   . Heart disease Mother      Current outpatient prescriptions:  .  aspirin 81 MG EC tablet, Take 81 mg by mouth every other day. , Disp: , Rfl:  .  doxycycline (VIBRAMYCIN) 100 MG capsule, Take 100 mg by mouth 2 (two) times daily., Disp: , Rfl:  .  fish oil-omega-3 fatty acids 1000 MG capsule, Take 2 g by mouth 2 (two) times daily.  , Disp: , Rfl:  .  glucosamine-chondroitin 500-400 MG tablet, Take 1 tablet by mouth 2 (two) times daily.  , Disp: , Rfl:  .  LORazepam (ATIVAN) 0.5 MG tablet, Take 1 tablet (0.5 mg total) by mouth 2 (two) times daily as needed for anxiety., Disp: 30 tablet, Rfl: 2 .  omeprazole (PRILOSEC) 20 MG capsule, Take 20 mg by mouth as needed (heartburn). , Disp: , Rfl:      Review of Systems  Constitutional: Positive for unexpected weight change. Negative for fever.  HENT: Negative for congestion, dental problem, ear pain, nosebleeds, postnasal drip, rhinorrhea, sinus pressure, sneezing, sore throat and trouble swallowing.   Eyes: Negative for redness and itching.  Respiratory: Negative for cough, chest tightness, shortness of breath and wheezing.   Cardiovascular:  Positive for chest pain. Negative for palpitations and leg swelling.  Gastrointestinal: Negative for nausea and vomiting.  Genitourinary: Negative for dysuria.  Musculoskeletal: Negative for joint swelling.  Skin: Negative for rash.  Neurological: Negative for headaches.  Hematological: Does not bruise/bleed easily.  Psychiatric/Behavioral: Negative for dysphoric mood. The patient is nervous/anxious.        Objective:   Physical Exam  Constitutional: He is oriented to person, place, and time. He appears well-developed and well-nourished. No distress.  HENT:  Head: Normocephalic and atraumatic.  Right Ear: External ear normal.  Left Ear: External ear normal.  Mouth/Throat: Oropharynx is clear and moist. No oropharyngeal exudate.  Eyes: Conjunctivae and EOM are normal. Pupils are equal, round, and reactive to light. Right eye exhibits no discharge. Left eye exhibits no discharge. No scleral icterus.  Neck: Normal range of motion. Neck supple. No JVD present. No tracheal deviation present. No thyromegaly present.  Cardiovascular: Normal rate, regular rhythm and intact distal pulses.  Exam reveals no gallop and no friction rub.   No murmur heard. Pulmonary/Chest: Effort normal and breath sounds normal. No respiratory distress. He has no wheezes. He has no rales. He exhibits no tenderness.  Abdominal: Soft. Bowel sounds are normal. He exhibits no distension and no mass. There is no tenderness. There is no rebound and no guarding.  Musculoskeletal: Normal range of motion. He exhibits no edema or tenderness.  Lymphadenopathy:    He has no cervical adenopathy.  Neurological: He is alert and oriented to person, place, and time. He has normal reflexes. No cranial nerve deficit. Coordination normal.  Skin: Skin is warm and dry. No rash noted. He is not diaphoretic. No erythema. No pallor.  Psychiatric: He has a normal mood and affect. His behavior is normal. Judgment and thought content normal.    Nursing note and vitals reviewed.   Filed Vitals:   09/07/15 1508  BP: 124/70  Pulse: 72  Height: 5' 8.5" (1.74 m)  Weight: 171 lb (77.565 kg)  SpO2: 97%        Assessment & Plan:     ICD-9-CM ICD-10-CM   1. Mass of lower lobe of left lung 786.6 J98.4 Ambulatory referral to Interventional Radiology  2. Mediastinal adenopathy 785.6 R59.9   3. Mass of lingula of lung 786.6 R91.8    High pretest probably ready for advanced age non-small cell lung cancer. Alternate differential diagnosis is inflammatory lesions of vasculitis .    - refer Interventional Radiology for CT guided lung biopsy  Of left lower lobe - do ASAP  - d/w Dr Reesa Chew of radiology and Dr Lamonte Sakai of INterventional pulmonary - best course is bx of this LLL lesion  - stop aspirin for 3days prior to procedure  - discussed Risks of pneumothorax, hemothorax, sedation/anesthesia complications such as cardiac or respiratory arrest or hypotension, stroke and bleeding all explained. Benefits of diagnosis but limitations of non-diagnosis also explained. Patient verbalized understanding and wished to proceed.    - return to see NP Eric Form 3-5 days after  biopsy to discuss results   Dr. Brand Males, M.D., [facility].C.P Pulmonary and Critical Care Medicine Staff Physician [place] [facility] Pager: [phone], If no answer or between  15:00h - 7:00h: call [phone]  09/07/2015 4:47 PM

## 2015-09-07 NOTE — Patient Instructions (Addendum)
ICD-9-CM ICD-10-CM   1. Mass of lower lobe of left lung 786.6 J98.4   2. Mediastinal adenopathy 785.6 R59.9   3. Mass of lingula of lung 786.6 R91.8    - refer Interventional Radiology for CT guided lung biopsy  Of left lower lobe - do ASAP  - stop aspirin for 3days prior to procedure   - return to see NP Eric Form 3-5 days after biopsy to discuss results

## 2015-09-10 ENCOUNTER — Telehealth: Payer: Self-pay | Admitting: Cardiovascular Disease

## 2015-09-10 NOTE — Telephone Encounter (Signed)
New Message:    Pt would like echo results from Thursday please.

## 2015-09-10 NOTE — Telephone Encounter (Signed)
Patient aware of echo results. Per Dr. Johnsie Cancel, normal echo with good EF and no significant valvular heart disease.

## 2015-09-11 NOTE — Patient Instructions (Addendum)
NPO after MN, needs driver arrive at 9 AM

## 2015-09-12 ENCOUNTER — Other Ambulatory Visit: Payer: Self-pay | Admitting: General Surgery

## 2015-09-13 ENCOUNTER — Ambulatory Visit (HOSPITAL_COMMUNITY)
Admission: RE | Admit: 2015-09-13 | Discharge: 2015-09-13 | Disposition: A | Discharge status: Home / Self Care | Payer: Medicare Other | Source: Ambulatory Visit | Attending: Internal Medicine | Admitting: Internal Medicine

## 2015-09-13 ENCOUNTER — Ambulatory Visit (HOSPITAL_COMMUNITY)
Admission: RE | Admit: 2015-09-13 | Discharge: 2015-09-13 | Disposition: A | Discharge status: Home / Self Care | Payer: Medicare Other | Source: Ambulatory Visit | Attending: Interventional Radiology | Admitting: Interventional Radiology

## 2015-09-13 DIAGNOSIS — J95811 Postprocedural pneumothorax: Secondary | ICD-10-CM

## 2015-09-13 DIAGNOSIS — C3432 Malignant neoplasm of lower lobe, left bronchus or lung: Secondary | ICD-10-CM | POA: Diagnosis not present

## 2015-09-13 DIAGNOSIS — Z9889 Other specified postprocedural states: Secondary | ICD-10-CM

## 2015-09-13 DIAGNOSIS — Z79899 Other long term (current) drug therapy: Secondary | ICD-10-CM | POA: Insufficient documentation

## 2015-09-13 DIAGNOSIS — R918 Other nonspecific abnormal finding of lung field: Secondary | ICD-10-CM

## 2015-09-13 DIAGNOSIS — Z7982 Long term (current) use of aspirin: Secondary | ICD-10-CM | POA: Insufficient documentation

## 2015-09-13 DIAGNOSIS — Z87891 Personal history of nicotine dependence: Secondary | ICD-10-CM | POA: Insufficient documentation

## 2015-09-13 DIAGNOSIS — R911 Solitary pulmonary nodule: Secondary | ICD-10-CM | POA: Diagnosis present

## 2015-09-13 LAB — CBC
HEMATOCRIT: 38.9 % — AB (ref 39.0–52.0)
HEMOGLOBIN: 12.8 g/dL — AB (ref 13.0–17.0)
MCH: 28.4 pg (ref 26.0–34.0)
MCHC: 32.9 g/dL (ref 30.0–36.0)
MCV: 86.4 fL (ref 78.0–100.0)
Platelets: 187 10*3/uL (ref 150–400)
RBC: 4.5 MIL/uL (ref 4.22–5.81)
RDW: 12.8 % (ref 11.5–15.5)
WBC: 7 10*3/uL (ref 4.0–10.5)

## 2015-09-13 LAB — PROTIME-INR
INR: 1.17 (ref 0.00–1.49)
Prothrombin Time: 15.1 seconds (ref 11.6–15.2)

## 2015-09-13 LAB — APTT: APTT: 31 s (ref 24–37)

## 2015-09-13 MED ORDER — LIDOCAINE HCL 1 % IJ SOLN
INTRAMUSCULAR | Status: AC
  Filled 2015-09-13: qty 20

## 2015-09-13 MED ORDER — MIDAZOLAM HCL 2 MG/2ML IJ SOLN
INTRAMUSCULAR | Status: AC
  Filled 2015-09-13: qty 2

## 2015-09-13 MED ORDER — MIDAZOLAM HCL 2 MG/2ML IJ SOLN
INTRAMUSCULAR | Status: AC | PRN
  Administered 2015-09-13 (×2): 1 mg via INTRAVENOUS

## 2015-09-13 MED ORDER — SODIUM CHLORIDE 0.9 % IV SOLN: INTRAVENOUS | Status: DC

## 2015-09-13 MED ORDER — FENTANYL CITRATE (PF) 100 MCG/2ML IJ SOLN
INTRAMUSCULAR | Status: AC
  Filled 2015-09-13: qty 2

## 2015-09-13 MED ORDER — FENTANYL CITRATE (PF) 100 MCG/2ML IJ SOLN
INTRAMUSCULAR | Status: AC | PRN
  Administered 2015-09-13 (×2): 50 ug via INTRAVENOUS

## 2015-09-13 NOTE — Procedures (Signed)
LLL lung nodule Bx 18 g times three No comp/EBL

## 2015-09-13 NOTE — Progress Notes (Signed)
Discussed CXR with Dr. Barbie Banner who states pt may be discharged but to return tomorrow at 0900 for f/u CXR. Pt informed.

## 2015-09-13 NOTE — Discharge Instructions (Signed)

## 2015-09-13 NOTE — Sedation Documentation (Signed)
Patient is resting comfortably. 

## 2015-09-13 NOTE — Sedation Documentation (Signed)
Patient denies pain and is resting comfortably.  

## 2015-09-13 NOTE — Sedation Documentation (Signed)
Additional meds given for patient comfort during final procedure manipulation

## 2015-09-13 NOTE — H&P (Signed)
Chief Complaint: Patient was seen in consultation today for No chief complaint on file.  at the request of [place]  Referring Physician(s): [place]  Supervising Physician: Marybelle Killings  History of Present Illness: Jason Summers is a 82 y.o. male with a LLL pulm nodule. Biopsy is requested.  Past Medical History  Diagnosis Date  . Arthritis   . Spondylosis of cervical joint   . Lateral epicondylitis (tennis elbow)   . Hemorrhoids   . Cancer ([place])     basal cell on right temple    Past Surgical History  Procedure Laterality Date  . Colonoscopy  2003  . Knee arthroscopy  2006    right    Allergies: Review of patient's allergies indicates no known allergies.  Medications: Prior to Admission medications   Medication Sig Start Date End Date Taking? Authorizing Provider  aspirin 81 MG EC tablet Take 81 mg by mouth every other day. Reported on 09/11/2015   Yes Historical Provider, MD  doxycycline (VIBRAMYCIN) 100 MG capsule Take 100 mg by mouth 2 (two) times daily. Reported on 09/11/2015   Yes Historical Provider, MD  fish oil-omega-3 fatty acids 1000 MG capsule Take 2 g by mouth 2 (two) times daily.     Yes Historical Provider, MD  glucosamine-chondroitin 500-400 MG tablet Take 1 tablet by mouth 2 (two) times daily. Reported on 09/11/2015   Yes Historical Provider, MD  ibuprofen (ADVIL,MOTRIN) 200 MG tablet Take 400 mg by mouth daily as needed for moderate pain.   Yes Historical Provider, MD  LORazepam (ATIVAN) 0.5 MG tablet Take 1 tablet (0.5 mg total) by mouth 2 (two) times daily as needed for anxiety. Patient taking differently: Take 0.5 mg by mouth daily as needed for anxiety.  05/18/15  Yes [name], MD  omeprazole (PRILOSEC) 20 MG capsule Take 20 mg by mouth as needed (heartburn).  03/12/12  Yes Ricard Dillon, MD     Family History  Problem Relation Age of Onset  . Coronary artery disease    . Hypertension Mother   . Heart disease Mother      Social History   Social History  . Marital Status: Married    Spouse Name: N/A  . Number of Children: N/A  . Years of Education: N/A   Occupational History  . Retired      Games developer   Social History Main Topics  . Smoking status: Former Smoker -- 0.20 packs/day for 62 years    Types: Cigarettes    Quit date: 03/10/2011  . Smokeless tobacco: Never Used     Comment: smokes 1 ciagettes occasionally  . Alcohol Use: 4.2 oz/week    7 Glasses of wine per week     Comment: 1-2 glasses of wine a day  . Drug Use: No  . Sexual Activity: Yes   Other Topics Concern  . Not on file   Social History Narrative      Review of Systems: A 12 point ROS discussed and pertinent positives are indicated in the HPI above.  All other systems are negative.  Review of Systems  Vital Signs: BP 133/69 mmHg  Pulse 77  Temp(Src) 98.3 F (36.8 C)  Resp 18  Ht 5' 10" (1.778 m)  Wt 163 lb (73.936 kg)  BMI 23.39 kg/m2  SpO2 98%  Physical Exam  Constitutional: He is oriented to person, place, and time. He appears well-developed and well-nourished.  Cardiovascular: Normal rate and regular rhythm.   Pulmonary/Chest: Effort  normal and breath sounds normal.  Neurological: He is alert and oriented to person, place, and time.    Mallampati Score:   1  Imaging: Dg Chest 2 View  08/21/2015  CLINICAL DATA:  Increase shortness of breath for 1 month EXAM: CHEST  2 VIEW COMPARISON:  05/07/2010 FINDINGS: There is hyperinflation of the lungs compatible with COPD. Bibasilar scarring. No confluent opacities otherwise. No effusions. Heart is normal size. No acute bony abnormality. IMPRESSION: COPD/chronic changes.  No active disease Electronically Signed   By: Rolm Baptise M.D.   On: 08/21/2015 13:03   Ct Angio Chest Pe W/cm &/or Wo Cm  08/23/2015  CLINICAL DATA:  Shortness of breath and left-sided chest pain for 2 months EXAM: CT ANGIOGRAPHY CHEST WITH CONTRAST TECHNIQUE: Multidetector CT imaging of  the chest was performed using the standard protocol during bolus administration of intravenous contrast. Multiplanar CT image reconstructions and MIPs were obtained to evaluate the vascular anatomy. CONTRAST:  67m OMNIPAQUE IOHEXOL 350 MG/ML SOLN COMPARISON:  Chest radiograph August 21, 2015 FINDINGS: Mediastinum/Lymph Nodes: There is no demonstrable pulmonary embolus. There is no thoracic aortic aneurysm or dissection. The visualized great vessels appear normal. There are scattered foci of coronary artery calcification. Pericardium is not appreciably thickened. Thyroid appears normal. There is an enlarged lymph node at the aortopulmonary window region on the left measuring 2.9 x 2.1 cm. There is a lymph node immediately anterior to the carina in the midline measuring 2.1 x 1.7 cm. There is a sub- carinal lymph node measuring 1.8 x 1.3 cm. There are several smaller mediastinal lymph nodes apparent elsewhere. There is a small hiatal hernia. Lungs/Pleura: There is a mass arising in the posterior segment of the left lower lobe measuring 3.3 x 3.3 cm. There is a second nodular lesion in the inferior lingula adjacent to the left heart border measuring 1.9 x 1.4 cm. A tiny focus of cavitation is noted in this lingular lesion. There is mild scarring in the right apex. There is mild atelectasis in the inferior lingula. Upper abdomen: There is a cystic lesion in the in the medial segment left lobe measuring 3.9 x 3.5 cm. There is incomplete visualization of a probable cyst near the gallbladder fossa measuring 1.3 x 1.0 cm. There is a small probable granuloma in the anterior liver dome region. Visualized upper abdominal structures otherwise appear unremarkable. Musculoskeletal: There are no blastic or lytic bone lesions. No chest wall lesions are evident. Review of the MIP images confirms the above findings. IMPRESSION: No demonstrable pulmonary embolus. Pulmonary nodular lesions on the left with adenopathy. Neoplasm is of  concern. There is a tiny cavitation in the lingular nodular lesion. These findings may warrant correlation with nuclear medicine PET study to further evaluate. No parenchymal lung edema or consolidation. Apparent cysts in the visualized portions of the liver. These results will be called to the ordering clinician or representative by the Radiologist Assistant, and communication documented in the PACS or zVision Dashboard. Electronically Signed   By: WLowella GripIII M.D.   On: 08/23/2015 11:06   Nm Pet Image Initial (pi) Skull Base To Thigh  09/05/2015  CLINICAL DATA:  Initial treatment strategy for left lung nodule. EXAM: NUCLEAR MEDICINE PET SKULL BASE TO THIGH TECHNIQUE: 8.54 mCi F-18 FDG was injected intravenously. Full-ring PET imaging was performed from the skull base to thigh after the radiotracer. CT data was obtained and used for attenuation correction and anatomic localization. FASTING BLOOD GLUCOSE:  Value: 103 mg/dl COMPARISON:  CTA chest dated 08/23/2015 FINDINGS: NECK No hypermetabolic lymph nodes in the neck. CHEST 2.9 x 3.6 cm left lower lobe mass, max SUV 23.1, compatible with primary bronchogenic neoplasm. Additional 1.7 x 1.3 cm cavitary nodule in the inferior left upper lobe (series 8/ image 48), max SUV 7.8, suspicious for metastasis. Associated mediastinal lymphadenopathy, including: --2.1 cm short axis prevascular node (series 4/image 70), max SUV 18.9 --1.0 cm short axis AP window node (series 4/image 70) --1.3 cm short axis low right paratracheal node (series 4/ image 73), max SUV 4.2 Heart is normal in size. No pericardial effusion. Three-vessel coronary atherosclerosis. Mild atherosclerotic calcifications of the aortic arch. ABDOMEN/PELVIS No abnormal hypermetabolic activity within the liver, pancreas, adrenal glands, or spleen. 3.7 cm cyst in the left hepatic lobe (series 4/image 108), benign. 6 mm nonobstructing right lower pole renal calculus (series 4/image 132).  Atherosclerotic calcifications of the abdominal aorta and branch vessels. Ectasia of the infrarenal abdominal aorta, measuring 3.0 x 2.6 cm (series 4/image 144). Prostatomegaly. Mild bladder wall thickening with possible nodularity versus a bladder diverticulum (series 4/image 188). No hypermetabolic lymph nodes in the abdomen or pelvis. SKELETON No focal hypermetabolic activity to suggest skeletal metastasis. IMPRESSION: 3.6 cm hypermetabolic left lower lobe mass, compatible with primary bronchogenic neoplasm. Suspected 1.7 cm left upper lobe pulmonary metastasis. Associated mediastinal nodal metastases, including a dominant 2.1 cm short axis prevascular node. No evidence of metastatic disease in the abdomen/pelvis. Mild irregular left anterior bladder wall thickening, favored to reflect a bladder diverticulum. Consider cystoscopic correlation as clinically warranted. Electronically Signed   By: Julian Hy M.D.   On: 09/05/2015 12:00    Labs:  CBC:  Recent Labs  10/20/14 0957 07/09/15 1520 08/21/15 1206 09/13/15 0941  WBC 5.7 7.8  --  7.0  HGB 14.9 14.4 13.9 12.8*  HCT 44.2 43.3  --  38.9*  PLT 171.0 212.0  --  187    COAGS:  Recent Labs  09/13/15 0941  INR 1.17  APTT 31    BMP:  Recent Labs  10/20/14 0957 07/09/15 1520 08/22/15 1443  NA 135 140 135  K 3.9 4.2 4.2  CL 103 107 103  CO2 _0 GLUCOSE 100* 87 102*  BUN _1 CALCIUM 9.0 9.0 8.6  CREATININE 0.93 0.83 0.84    LIVER FUNCTION TESTS:  Recent Labs  10/20/14 0957 07/09/15 1520  BILITOT 0.6 0.3  AST 13 14  ALT 12 10  ALKPHOS 59 79  PROT 7.1 7.0  ALBUMIN 4.0 4.0    TUMOR MARKERS: No results for input(s): AFPTM, CEA, CA199, CHROMGRNA in the last 8760 hours.  Assessment and Plan:  LLL pulmonary nodule. Biopsy to follow.  Thank you for this interesting consult.  I greatly enjoyed meeting Jason Summers and look forward to participating in their care.  A copy of this report was sent to  the requesting provider on this date.  Electronically Signed: Leelynd Maldonado, ART A 09/13/2015, 11:04 AM   I spent a total of  30 Minutes   in face to face in clinical consultation, greater than 50% of which was counseling/coordinating care for lung nodule.

## 2015-09-14 ENCOUNTER — Ambulatory Visit (HOSPITAL_COMMUNITY)
Admission: RE | Admit: 2015-09-14 | Discharge: 2015-09-14 | Disposition: A | Discharge status: Home / Self Care | Payer: Medicare Other | Source: Ambulatory Visit | Attending: Interventional Radiology | Admitting: Interventional Radiology

## 2015-09-14 DIAGNOSIS — J95811 Postprocedural pneumothorax: Secondary | ICD-10-CM | POA: Diagnosis present

## 2015-09-14 NOTE — Progress Notes (Signed)
Patient ID: Jason Summers, male   DOB: 05/17/1932, 82 y.o.   MRN: [id number] Repeat CXR this morning reveals no further evidence of PTX s/p lung biopsy yesterday.  He is asymptomatic today with no SOB.  His film was reviewed with him and he was stable to go home.  Brentney Goldbach E 10:14 AM 09/14/2015

## 2015-09-18 ENCOUNTER — Telehealth: Payer: Self-pay | Admitting: Internal Medicine

## 2015-09-18 NOTE — Telephone Encounter (Signed)
Called and spoke with pt's wife. I informed her that the results have been finalized yet. I explained to her that once they are final and we receive MR's recs. We will return her call. She voiced understanding and had no further questions. Nothing further needed.

## 2015-09-23 ENCOUNTER — Telehealth: Payer: Self-pay | Admitting: Internal Medicine

## 2015-09-23 NOTE — Telephone Encounter (Signed)
Guys  His [place] bx from 09/13/15 resulted on 09/17/15 or 09/18/15 and shows likely lung cancer. He is yet to get the diagnosis though my OV from 09/07/15 clearly stated that he is to see SG for results 3-5 days after biopsy ( I did not want him sitting without a result in my vacation absence). Pleaset get him in 09/24/15 PM to see a TP/SG to get diagnosis. If they cannot see, I am at [place] PM roation and can come by 3p-4p  Thanks  Dr. Brand Males, M.D., [facility].C.P Pulmonary and Critical Care Medicine Staff Physician [place] [facility] Pager: [phone], If no answer or between  15:00h - 7:00h: call [phone]  09/23/2015 4:19 PM

## 2015-09-24 ENCOUNTER — Encounter: Payer: Self-pay | Admitting: Acute Care

## 2015-09-24 ENCOUNTER — Telehealth: Payer: Self-pay | Admitting: *Deleted

## 2015-09-24 ENCOUNTER — Ambulatory Visit (INDEPENDENT_AMBULATORY_CARE_PROVIDER_SITE_OTHER): Payer: Medicare Other | Admitting: Acute Care

## 2015-09-24 VITALS — BP 100/60 | HR 72 | Temp 98.2°F | Ht 70.0 in | Wt 166.8 lb

## 2015-09-24 DIAGNOSIS — C3492 Malignant neoplasm of unspecified part of left bronchus or lung: Secondary | ICD-10-CM

## 2015-09-24 DIAGNOSIS — C3490 Malignant neoplasm of unspecified part of unspecified bronchus or lung: Secondary | ICD-10-CM | POA: Diagnosis not present

## 2015-09-24 HISTORY — DX: Malignant neoplasm of unspecified part of left bronchus or lung: C34.92

## 2015-09-24 NOTE — Telephone Encounter (Signed)
Jason Summers  Thanks for seeing huim. News of cancer needs to broken to him. He and wife are aawre of high prob for cancer. I got staff message from [facility] that at [facility] they have staged it as stage 3 NSCLC. [facility] will be willing to see him - referral needed  Thanks  Dr. Brand Males, M.D., [facility].C.P Pulmonary and Critical Care Medicine Staff Physician [place] [facility] Pager: [phone], If no answer or between  15:00h - 7:00h: call [phone]  09/24/2015 10:26 AM   PS: son of patient lives in [place] apparentlty

## 2015-09-24 NOTE — Telephone Encounter (Signed)
Oncology Nurse Navigator Documentation  Oncology Nurse Navigator Flowsheets 09/24/2015  Navigator Encounter Type Telephone  Telephone Outgoing Call  Treatment Phase Pre-Tx/Tx Discussion  Barriers/Navigation Needs Coordination of Care  Interventions Coordination of Care  Coordination of Care Appts  Acuity Level 1  Time Spent with Patient 15   I received referral from Dr. Golden Pop office today.  I called and left my name and phone number to call.

## 2015-09-24 NOTE — Progress Notes (Signed)
Subjective:    Patient ID: Jason Summers, male    DOB: [birth year], 82 y.o.   MRN: [id number]  HPI    Jason Summers is A 82 y.o. male is referred for pulmonary nodule/mass and also medially similar adenopathy. He has a positive remote smoking history. He is an immigrant from [place]. 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 He now lives in [place]. He presents with his Summers Jason Summers. He has been referred by cardiologist Dr. Leonides Summers. He has been having some atypical chest pain in the left precordial area. According to him and his Summers this resulted in a d-dimer test that was slightly elevated. This resulted in a CT anginal chest that ruled out pulmonary embolism. Dr. Lynford Summers personally visualized the film. This then resulted in PET scan done 09/05/2015 that Dr. Chase Summers personally visualized that shows a large left lower lobe subpleural mass along with a small lingular subpleural cavitating lesion that coincides with the area of the pain and also had heart mediastinal lymphatic adenopathy in the AP window. In addition there is a low uptake enlargement of pre-carinal and subcarinal lymphadenopathy. There is some weight loss history. He and his Summers actually worried that this is lung cancer. His Summers is in tears.  He is otherwise healthy. Chemistry evaluation 08/22/2015 was normal. His only blood thinner his aspirin daily. Echocardiogram yesterday 09/06/2015 was normal with ejection fraction 60%   He has a past medical history of Arthritis; Spondylosis of cervical joint; Lateral epicondylitis (tennis elbow); Hemorrhoids; and Cancer ([place]).  He reports that he quit smoking about 4 years ago. His smoking use included Cigarettes. He has a 12.4 pack-year smoking history. He has never used smokeless tobacco.  Significant events/procedures:   08/23/2015: CT Angio Chest RO PE: Pulmonary nodular lesions on the  left with adenopathy. Neoplasm is of concern. There is a tiny cavitation in the lingular nodular Lesion.  09/05/2015: PET scan: IMPRESSION: 3.6 cm hypermetabolic left lower lobe mass, compatible with primary bronchogenic neoplasm. Suspected 1.7 cm left upper lobe pulmonary metastasis. Associated mediastinal nodal metastases, including a dominant 2.1 cm short axis prevascular node.   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 There is likely sufficient tumor remaining for additional studies, if requested. (JBK:ds 09/17/15)   09/24/2015: Follow up visit: Patient presents to the office for follow-up and discussion of left lower lobe lung mass biopsy. Dr. Chase Summers has been working this patient up for pulmonary nodule/mass. I explained to Jason Summers that unfortunately the biopsy was read as a Poorly differentiated non-small cell cancer. We discussed that this was not the outcome that they had hoped for, but that now with the diagnosis we can move forward with the appropriate treatment. They understand the significance of the diagnosis. I discussed with them that the biopsy had been viewed at the multidisciplinary thoracic conference. I explained that the diagnosis of poorly differentiated non-small cell cancer was determined by a multidisciplinary team  of experts. I explained that we would refer him for care to Jason Summers, within medical oncology at the [facility]. I explained that Jason Summers, Jason Summers, would be in touch with them regarding scheduling an appointment in order to allow them to begin the appropriate  staging, plan of care, and treatment. We discussed that I have requested that the appointment be scheduled as soon as possible. They both verbalized  understanding of the above. The biopsy site appeared well healed with no redness swelling or drainage at the site..   Current outpatient prescriptions:  .  aspirin 81 MG EC tablet, Take 81 mg by mouth every other day. Reported on 09/11/2015, Disp: , Rfl:  .  fish oil-omega-3 fatty acids 1000 MG capsule, Take 2 g by mouth 2 (two) times daily.  , Disp: , Rfl:  .  glucosamine-chondroitin 500-400 MG tablet, Take 1 tablet by mouth 2 (two) times daily. Reported on 09/11/2015, Disp: , Rfl:  .  ibuprofen (ADVIL,MOTRIN) 200 MG tablet, Take 400 mg by mouth daily as needed for moderate pain., Disp: , Rfl:  .  LORazepam (ATIVAN) 0.5 MG tablet, Take 1 tablet (0.5 mg total) by mouth 2 (two) times daily as needed for anxiety. (Patient taking differently: Take 0.5 mg by mouth daily as needed for anxiety. ), Disp: 30 tablet, Rfl: 2 .  omeprazole (PRILOSEC) 20 MG capsule, Take 20 mg by mouth as needed (heartburn). , Disp: , Rfl:    Past Medical History  Diagnosis Date  . Arthritis   . Spondylosis of cervical joint   . Lateral epicondylitis (tennis elbow)   . Hemorrhoids   . 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  HEENT:   No headaches,  Difficulty swallowing,  Tooth/dental problems, or  Sore throat,                No sneezing, itching, ear ache, nasal congestion, post nasal drip,   CV:  + chest pain,  Orthopnea, PND, no swelling in lower extremities, anasarca, dizziness, palpitations, syncope.   GI  No heartburn, indigestion, abdominal pain, nausea, vomiting, diarrhea, change in bowel habits, loss of appetite, bloody stools.   Resp: No shortness of breath with exertion or at rest.  No excess mucus, no productive cough,  No non-productive cough,  No coughing up of blood.  No change in color of mucus.  No wheezing.  No chest wall deformity  Skin: no rash or  lesions.  GU: no dysuria, change in color of urine, no urgency or frequency.  No flank pain, no hematuria   MS:  No joint pain or swelling.  No decreased range of motion.  No back pain.  Psych:  No change in mood or affect. No depression or anxiety.  No memory loss.        Objective:   Physical Exam BP 100/60 mmHg  Pulse 72  Temp(Src) 98.2 F (36.8 C) (Oral)  Ht _0  (1.778 m)  Wt 166 lb 12.8 oz (75.66 kg)  BMI 23.93 kg/m2  SpO2 97% Physical Exam:  General- No distress,  A&Ox3 ENT: No sinus tenderness, TM clear, pale nasal mucosa, no oral exudate,no post nasal drip, no LAN Cardiac: S1, S2, regular rate and rhythm, no murmur Chest: No wheeze/ rales/ dullness; no accessory muscle use, no nasal flaring, no sternal retractions Abd.: Soft Non-tender Ext: No clubbing cyanosis, edema Neuro:  normal strength Skin: No rashes, warm and dry Psych: normal mood and behavior  Magdalen Spatz, AGACNP-BC [facility] 09/24/2015     Assessment & Plan:

## 2015-09-24 NOTE — Telephone Encounter (Signed)
Spoke with pt's wife.  Pt scheduled to see SG today at 3:30.. Wife aware and voiced no further questions at this time.

## 2015-09-24 NOTE — Telephone Encounter (Signed)
ATC number listed in pt's chart - as the number listed below is different. Unable to leave VM for pt. WCB.

## 2015-09-24 NOTE — Telephone Encounter (Signed)
Pt wife returning call and can be reached @ [phone] .Hillery Hunter

## 2015-09-24 NOTE — Telephone Encounter (Signed)
Patients wife is calling back. Missed call back from crystal

## 2015-09-24 NOTE — Patient Instructions (Signed)
It is nice to meet you today. I am sorry we did not have better news for you today. We will refer you to Dr. Earlie Server today for further work up and treatment plan of  your lung nodule. You will get a call from his Nurse Navigator Norton Blizzard who will schedule your appointment. You do not need to follow up with Dr. Chase Caller. Please let us know if there is anything further that we can do for you.

## 2015-09-24 NOTE — Assessment & Plan Note (Signed)
Biopsy supported diagnosis of poorly differentiated non-small cell lung cancer. Plan: We will refer you to Dr. Earlie Server today for further work up and treatment of  your lung nodule. You will get a call from his Nurse Navigator Norton Blizzard who will schedule your appointment.( Confirmed by [facility] PCP) You do not need to follow up with Dr. Chase Caller. Please let us know if there is anything further that we can do for you.

## 2015-09-24 NOTE — Telephone Encounter (Signed)
[email or web address] for pt

## 2015-09-25 ENCOUNTER — Ambulatory Visit (INDEPENDENT_AMBULATORY_CARE_PROVIDER_SITE_OTHER): Payer: Medicare Other | Admitting: Internal Medicine

## 2015-09-25 ENCOUNTER — Encounter: Payer: Self-pay | Admitting: Internal Medicine

## 2015-09-25 ENCOUNTER — Telehealth: Payer: Self-pay | Admitting: *Deleted

## 2015-09-25 VITALS — BP 108/60 | HR 78 | Temp 98.5°F | Resp 18 | Ht 70.0 in | Wt 169.0 lb

## 2015-09-25 DIAGNOSIS — C3492 Malignant neoplasm of unspecified part of left bronchus or lung: Secondary | ICD-10-CM

## 2015-09-25 DIAGNOSIS — H01024 Squamous blepharitis left upper eyelid: Secondary | ICD-10-CM | POA: Insufficient documentation

## 2015-09-25 DIAGNOSIS — Z961 Presence of intraocular lens: Secondary | ICD-10-CM

## 2015-09-25 DIAGNOSIS — H2511 Age-related nuclear cataract, right eye: Secondary | ICD-10-CM | POA: Insufficient documentation

## 2015-09-25 DIAGNOSIS — H04203 Unspecified epiphora, bilateral lacrimal glands: Secondary | ICD-10-CM

## 2015-09-25 DIAGNOSIS — H01021 Squamous blepharitis right upper eyelid: Secondary | ICD-10-CM | POA: Insufficient documentation

## 2015-09-25 HISTORY — DX: Unspecified epiphora, bilateral: H04.203

## 2015-09-25 HISTORY — DX: Age-related nuclear cataract, right eye: H25.11

## 2015-09-25 HISTORY — DX: Presence of intraocular lens: Z96.1

## 2015-09-25 NOTE — Progress Notes (Signed)
Subjective:    Patient ID: Jason Summers, male    DOB: 1931-07-19, 82 y.o.   MRN: [id number]  HPI  82 year old patient with a recent diagnosis of poorly differentiated metastatic lung cancer.  He is scheduled for oncology evaluation in 2 days.  His wife is also been diagnosed with a poorly differentiated hormone negative right breast cancer.  She is scheduled for general surgery evaluation soon  He has only minimal left-sided chest discomfort which is much improved.  He has a history of prior tobacco use and also significant asbestos exposure  Both patients were scheduled for an international trip and requesting a letter to excuse due to medical complications.  Wt Readings from Last 3 Encounters:  09/25/15 169 lb (76.658 kg)  09/24/15 166 lb 12.8 oz (75.66 kg)  09/13/15 163 lb (73.936 kg)    Past Medical History  Diagnosis Date  . Arthritis   . Spondylosis of cervical joint   . Lateral epicondylitis (tennis elbow)   . Hemorrhoids   . 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 Marital Status: Married    Spouse Name: N/A  . Number of Children: N/A  . Years of Education: N/A   Occupational History  . Retired      Games developer   Social History Main Topics  . Smoking status: Former Smoker -- 0.20 packs/day for 62 years    Types: Cigarettes    Quit date: 03/10/2011  . Smokeless tobacco: Never Used     Comment: smokes 1 ciagettes occasionally  . Alcohol Use: 4.2 oz/week    7 Glasses of wine per week     Comment: 1-2 glasses of wine a day  . Drug Use: No  . Sexual Activity: Yes   Other Topics Concern  . Not on file   Social History Narrative    Past Surgical History  Procedure Laterality Date  . Colonoscopy  2003  . Knee arthroscopy  2006    right    Family History  Problem Relation Age of Onset  . Coronary artery disease    . Hypertension Mother   . Heart disease Mother     No Known Allergies  Current Outpatient  Prescriptions on File Prior to Visit  Medication Sig Dispense Refill  . aspirin 81 MG EC tablet Take 81 mg by mouth every other day. Reported on 09/11/2015    . fish oil-omega-3 fatty acids 1000 MG capsule Take 2 g by mouth 2 (two) times daily.      Marland Kitchen glucosamine-chondroitin 500-400 MG tablet Take 1 tablet by mouth 2 (two) times daily. Reported on 09/11/2015    . ibuprofen (ADVIL,MOTRIN) 200 MG tablet Take 400 mg by mouth daily as needed for moderate pain.    Marland Kitchen LORazepam (ATIVAN) 0.5 MG tablet Take 1 tablet (0.5 mg total) by mouth 2 (two) times daily as needed for anxiety. (Patient taking differently: Take 0.5 mg by mouth daily as needed for anxiety. ) 30 tablet 2  . omeprazole (PRILOSEC) 20 MG capsule Take 20 mg by mouth as needed (heartburn).      No current facility-administered medications on file prior to visit.    BP 108/60 mmHg  Pulse 78  Temp(Src) 98.5 F (36.9 C) (Oral)  Resp 18  Ht _0  (1.778 m)  Wt 169 lb (76.658 kg)  BMI 24.25 kg/m2  SpO2 98%     Review of Systems  Constitutional: Negative for fever, chills, appetite change and fatigue.  HENT: Negative for congestion, dental problem, ear pain, hearing loss, sore throat, tinnitus, trouble swallowing and voice change.   Eyes: Negative for pain, discharge and visual disturbance.  Respiratory: Negative for cough, chest tightness, wheezing and stridor.   Cardiovascular: Positive for chest pain. Negative for palpitations and leg swelling.  Gastrointestinal: Negative for nausea, vomiting, abdominal pain, diarrhea, constipation, blood in stool and abdominal distention.  Genitourinary: Negative for urgency, hematuria, flank pain, discharge, difficulty urinating and genital sores.  Musculoskeletal: Negative for myalgias, back pain, joint swelling, arthralgias, gait problem and neck stiffness.  Skin: Negative for rash.  Neurological: Negative for dizziness, syncope, speech difficulty, weakness, numbness and headaches.    Hematological: Negative for adenopathy. Does not bruise/bleed easily.  Psychiatric/Behavioral: Negative for behavioral problems and dysphoric mood. The patient is not nervous/anxious.        Objective:   Physical Exam  Constitutional: He is oriented to person, place, and time. He appears well-developed.  HENT:  Head: Normocephalic.  Right Ear: External ear normal.  Left Ear: External ear normal.  Eyes: Conjunctivae and EOM are normal.  Neck: Normal range of motion.  No adenopathy  Cardiovascular: Normal rate and normal heart sounds.   Pulmonary/Chest: Breath sounds normal.  Abdominal: Bowel sounds are normal.  Musculoskeletal: Normal range of motion. He exhibits no edema or tenderness.  Neurological: He is alert and oriented to person, place, and time.  Psychiatric: He has a normal mood and affect. His behavior is normal.          Assessment & Plan:   Poorly differentiated metastatic lung carcinoma.  Oncology follow-up as scheduled.  Letter in his behalf and his wife's dictated to excuse from international trip due to medical complications  Return here when necessary

## 2015-09-25 NOTE — Patient Instructions (Signed)
Call or return to clinic prn if these symptoms worsen or fail to improve as anticipated.

## 2015-09-25 NOTE — Progress Notes (Signed)
Pre visit review using our clinic review tool, if applicable. No additional management support is needed unless otherwise documented below in the visit note.

## 2015-09-25 NOTE — Telephone Encounter (Signed)
Oncology Nurse Navigator Documentation  Oncology Nurse Navigator Flowsheets 09/25/2015  Navigator Encounter Type Telephone  Telephone Outgoing Call  Treatment Phase Pre-Tx/Tx Discussion  Barriers/Navigation Needs Coordination of Care  Interventions Coordination of Care  Coordination of Care Appts  Acuity Level 1  Acuity Level 1 Initial guidance, education and coordination as needed  Time Spent with Patient 15   Patient's wife called and left me a vm message to call.  I called her back. I gave her and her husband appt to be seen at [facility] on 09/27/15.  They verbalized understanding of appt time and place.

## 2015-09-26 ENCOUNTER — Telehealth: Payer: Self-pay | Admitting: *Deleted

## 2015-09-26 NOTE — Telephone Encounter (Signed)
Left message w/ friendly reminder about clinic appt for 09/27/15 for pt.

## 2015-09-27 ENCOUNTER — Encounter (HOSPITAL_COMMUNITY): Payer: Self-pay

## 2015-09-27 ENCOUNTER — Telehealth: Payer: Self-pay | Admitting: Internal Medicine

## 2015-09-27 ENCOUNTER — Encounter: Payer: Self-pay | Admitting: Internal Medicine

## 2015-09-27 ENCOUNTER — Encounter: Payer: Self-pay | Admitting: *Deleted

## 2015-09-27 ENCOUNTER — Other Ambulatory Visit (HOSPITAL_BASED_OUTPATIENT_CLINIC_OR_DEPARTMENT_OTHER): Payer: Medicare Other

## 2015-09-27 ENCOUNTER — Ambulatory Visit
Admission: RE | Admit: 2015-09-27 | Discharge: 2015-09-27 | Disposition: A | Discharge status: Home / Self Care | Payer: Medicare Other | Source: Ambulatory Visit | Attending: Radiation Oncology | Admitting: Radiation Oncology

## 2015-09-27 ENCOUNTER — Ambulatory Visit: Payer: Medicare Other | Admitting: Physical Therapy

## 2015-09-27 ENCOUNTER — Ambulatory Visit (HOSPITAL_BASED_OUTPATIENT_CLINIC_OR_DEPARTMENT_OTHER): Payer: Medicare Other | Admitting: Internal Medicine

## 2015-09-27 VITALS — BP 98/56 | HR 89 | Temp 98.6°F | Resp 18 | Ht 70.0 in | Wt 168.3 lb

## 2015-09-27 DIAGNOSIS — C3492 Malignant neoplasm of unspecified part of left bronchus or lung: Secondary | ICD-10-CM

## 2015-09-27 DIAGNOSIS — R599 Enlarged lymph nodes, unspecified: Secondary | ICD-10-CM | POA: Diagnosis not present

## 2015-09-27 DIAGNOSIS — R911 Solitary pulmonary nodule: Secondary | ICD-10-CM

## 2015-09-27 DIAGNOSIS — C3432 Malignant neoplasm of lower lobe, left bronchus or lung: Secondary | ICD-10-CM

## 2015-09-27 DIAGNOSIS — Z87891 Personal history of nicotine dependence: Secondary | ICD-10-CM | POA: Diagnosis not present

## 2015-09-27 LAB — COMPREHENSIVE METABOLIC PANEL
ALK PHOS: 87 U/L (ref 40–150)
ALT: 10 U/L (ref 0–55)
ANION GAP: 7 meq/L (ref 3–11)
AST: 13 U/L (ref 5–34)
Albumin: 3.3 g/dL — ABNORMAL LOW (ref 3.5–5.0)
BILIRUBIN TOTAL: 0.39 mg/dL (ref 0.20–1.20)
BUN: 18 mg/dL (ref 7.0–26.0)
CO2: 23 meq/L (ref 22–29)
Calcium: 8.8 mg/dL (ref 8.4–10.4)
Chloride: 108 mEq/L (ref 98–109)
Creatinine: 0.9 mg/dL (ref 0.7–1.3)
EGFR: 80 mL/min/{1.73_m2} — AB (ref >90)
GLUCOSE: 117 mg/dL (ref 70–140)
POTASSIUM: 4 meq/L (ref 3.5–5.1)
SODIUM: 138 meq/L (ref 136–145)
TOTAL PROTEIN: 7.2 g/dL (ref 6.4–8.3)

## 2015-09-27 LAB — CBC WITH DIFFERENTIAL/PLATELET
BASO%: 0.2 % (ref 0.0–2.0)
BASOS ABS: 0 10*3/uL (ref 0.0–0.1)
EOS ABS: 0.2 10*3/uL (ref 0.0–0.5)
EOS%: 1.7 % (ref 0.0–7.0)
HCT: 38.8 % (ref 38.4–49.9)
HGB: 12.9 g/dL — ABNORMAL LOW (ref 13.0–17.1)
LYMPH%: 15.4 % (ref 14.0–49.0)
MCH: 28.5 pg (ref 27.2–33.4)
MCHC: 33.2 g/dL (ref 32.0–36.0)
MCV: 85.8 fL (ref 79.3–98.0)
MONO#: 1 10*3/uL — ABNORMAL HIGH (ref 0.1–0.9)
MONO%: 9.6 % (ref 0.0–14.0)
NEUT#: 7.5 10*3/uL — ABNORMAL HIGH (ref 1.5–6.5)
NEUT%: 73.1 % (ref 39.0–75.0)
PLATELETS: 219 10*3/uL (ref 140–400)
RBC: 4.52 10*6/uL (ref 4.20–5.82)
RDW: 12.6 % (ref 11.0–14.6)
WBC: 10.2 10*3/uL (ref 4.0–10.3)
lymph#: 1.6 10*3/uL (ref 0.9–3.3)

## 2015-09-27 MED ORDER — PROCHLORPERAZINE MALEATE 10 MG PO TABS: 10.0000 mg | ORAL_TABLET | Freq: Four times a day (QID) | ORAL | Status: DC | PRN

## 2015-09-27 NOTE — Telephone Encounter (Signed)
Gave and prnted appt sched and avs for pt for May thru July

## 2015-09-27 NOTE — Progress Notes (Signed)
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 male who presented to his PCP for shortness of breath and left-sided chest tightness on 07/09/15. His labs were normal at that time, except for a mildly elevated D-Dimer level. He continued to exhibit these symptoms and a CT angiogram of the chest on 08/23/15 was conducted. This ruled out pulmonary emboli, but found multiple pulmonary lesions on the left with adenopathy.  PET scan on 09/05/15 noted a 2.9 x 3.6 cm hypermetabolic LLL mass (compatible with a primary bronchogenic neoplasm), a 1.7 x 1.3 cm LUL pulmonary metastasis, associated mediastinal nodal metastases, and no evidence of metastatic disease in the abd/pelvis.  Biopsy of the LLL on 09/13/15 revealed poorly differentiated non-small cell carcinoma.The malignant cells are positive for cytokeratin 5/6, cytokeratin 903, p63, p75, CDX2 (significance of which is unknown), and focally positive for S-100. They are negative for cytokeratin 20, cytokeratin 7, EMA, MOC-31, Napsin A, TTF-1, HMB-45, and Melan-A. 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 PD-L1 testing demonstrated a tumor proportion score of 35%; low expression.  The patient presents to multidisciplinary thoracic clinic and myself to discuss the role of radiotherapy in the management of his disease. The patient's wife was present during this encounter. She has recently been diagnosed with breast  cancer.  PREVIOUS RADIATION THERAPY: No  PAST MEDICAL HISTORY:  has a past medical history of Arthritis; Spondylosis of cervical joint; Lateral epicondylitis (tennis elbow); Hemorrhoids; and Cancer ([place]).    PAST SURGICAL HISTORY: Past Surgical History  Procedure Laterality Date  . Colonoscopy  2003  . Knee arthroscopy  2006    right    FAMILY HISTORY: family history includes Heart disease in his mother; Hypertension in his mother.  SOCIAL HISTORY:  reports that he quit smoking about 4 years ago. His smoking use included Cigarettes. He has a 12.4 pack-year smoking history. He has never used smokeless tobacco. He reports that he drinks about 4.2 oz of alcohol per week. He reports that he does not use illicit drugs. He is retired now but remains active. He previously was a Games developer  ALLERGIES: Review of patient's allergies indicates no known allergies.  MEDICATIONS:  Current Outpatient Prescriptions  Medication Sig Dispense Refill  . aspirin 81 MG EC tablet Take 81 mg by mouth every other day. Reported on 09/11/2015    . fish oil-omega-3 fatty acids 1000 MG capsule Take 2 g by mouth 2 (two) times daily.      Marland Kitchen glucosamine-chondroitin 500-400 MG tablet Take 1 tablet by mouth 2 (two) times daily. Reported on 09/11/2015    . ibuprofen (ADVIL,MOTRIN) 200 MG tablet Take 400 mg by mouth daily as needed for moderate pain. Reported on 09/27/2015    . LORazepam (ATIVAN) 0.5 MG tablet Take 1 tablet (0.5 mg total) by mouth 2 (two) times daily as needed for anxiety. (Patient taking differently: Take 0.5 mg by mouth daily as needed for anxiety. ) 30 tablet 2  . omeprazole (PRILOSEC) 20 MG capsule Take 20 mg by mouth as needed (heartburn).     Marland Kitchen  prochlorperazine (COMPAZINE) 10 MG tablet Take 1 tablet (10 mg total) by mouth every 6 (six) hours as needed for nausea or vomiting. 30 tablet 0   No current facility-administered medications for this encounter.    REVIEW OF SYSTEMS:  A 15 point review of  systems is documented in the electronic medical record. This was obtained by the nursing staff. However, I reviewed this with the patient to discuss relevant findings and make appropriate changes.  Pertinent items noted in HPI and remainder of comprehensive ROS otherwise negative.   He reports head "tightness" this morning. He reports that he has problems with his tear ducts causing excessive watering. He denies sob. He reports some weakness and fatigue. Reports loss of appetite and nausea, but denies emesis. The patient denies edema of his extremities.   PHYSICAL EXAM:  vitals were not taken for this visit.  Vitals with BMI 09/27/2015  Height _0   Weight 168 lbs 5 oz  BMI 32.9  Systolic 98  Diastolic 56  Pulse 89  Respirations 18   General: Alert and oriented, in no acute distress HEENT: Head is normocephalic. Extraocular movements are intact. Oropharynx is clear. Neck: Neck is supple, no palpable cervical or supraclavicular lymphadenopathy. Heart: Regular in rate and rhythm with no murmurs, rubs, or gallops. Chest: Clear to auscultation bilaterally, with no rhonchi, wheezes, or rales. Abdomen: Soft, nontender, nondistended, with no rigidity or guarding. Extremities: No cyanosis or edema. The patient is missing part of the third digit of his left hand. Lymphatics: see Neck Exam Skin: No concerning lesions. Musculoskeletal: symmetric strength and muscle tone throughout. Neurologic: Cranial nerves II through XII are grossly intact. No obvious focalities. Speech is fluent. Coordination is intact. Psychiatric: Judgment and insight are intact. Affect is appropriate.  ECOG = 1  LABORATORY DATA:  Lab Results  Component Value Date   WBC 10.2 09/27/2015   HGB 12.9* 09/27/2015   HCT 38.8 09/27/2015   MCV 85.8 09/27/2015   PLT 219 09/27/2015   NEUTROABS 7.5* 09/27/2015   Lab Results  Component Value Date   NA 138 09/27/2015   K 4.0 09/27/2015   CL 103 08/22/2015   CO2 23  09/27/2015   GLUCOSE 117 09/27/2015   CREATININE 0.9 09/27/2015   CALCIUM 8.8 09/27/2015      RADIOGRAPHY: Dg Chest 1 View  09/14/2015  CLINICAL DATA:  82 year old male status post CT-guided left lower lung biopsy yesterday with tiny left pneumothorax. Subsequent encounter. EXAM: CHEST 1 VIEW COMPARISON:  09/13/2015 and earlier. FINDINGS: Stable lung volumes. No convincing pneumothorax. Stable cardiac size and mediastinal contours. Stable radiographic appearance of the left lower lobe lung nodule with scattered bilateral curvilinear scarring. No evidence of pleural effusion. No acute pulmonary opacity. Stable visualized osseous structures. IMPRESSION: Tiny left pneumothorax has resolved. No new cardiopulmonary abnormality. Electronically Signed   By: Genevie Ann M.D.   On: 09/14/2015 09:07   Dg Chest 1 View  09/13/2015  CLINICAL DATA:  Post biopsy of left lower lobe lung lesion EXAM: CHEST 1 VIEW COMPARISON:  Chest x-ray of 09/13/2015 and CT of 08/23/2015 FINDINGS: There still appears to be a tiny left apical pneumothorax present of less than 5%. The nodule at the left lung base again is noted. The right lung is clear. IMPRESSION: Tiny residual left apical pneumothorax of less than 5%. Electronically Signed   By: Ivar Drape M.D.   On: 09/13/2015 14:20   Dg Chest 1 View  09/13/2015  CLINICAL DATA:  Status post biopsy  of the left lung. EXAM: CHEST 1 VIEW COMPARISON:  08/21/2015 FINDINGS: Trace left apical pneumothorax, less than 5%. Pneumothorax is expected given the last CT image during CT fluoroscopy. There is chronic interstitial coarsening at the bases. Normal heart size and mediastinal contours. No evidence of alveolar hemorrhage or hemothorax. Left basilar lung nodule Dr Barbie Banner is already aware of these results. IMPRESSION: Trace left apical pneumothorax, less than 5%. Electronically Signed   By: Monte Fantasia M.D.   On: 09/13/2015 13:37   Nm Pet Image Initial (pi) Skull Base To Thigh  09/05/2015   CLINICAL DATA:  Initial treatment strategy for left lung nodule. EXAM: NUCLEAR MEDICINE PET SKULL BASE TO THIGH TECHNIQUE: 8.54 mCi F-18 FDG was injected intravenously. Full-ring PET imaging was performed from the skull base to thigh after the radiotracer. CT data was obtained and used for attenuation correction and anatomic localization. FASTING BLOOD GLUCOSE:  Value: 103 mg/dl COMPARISON:  CTA chest dated 08/23/2015 FINDINGS: NECK No hypermetabolic lymph nodes in the neck. CHEST 2.9 x 3.6 cm left lower lobe mass, max SUV 23.1, compatible with primary bronchogenic neoplasm. Additional 1.7 x 1.3 cm cavitary nodule in the inferior left upper lobe (series 8/ image 48), max SUV 7.8, suspicious for metastasis. Associated mediastinal lymphadenopathy, including: --2.1 cm short axis prevascular node (series 4/image 70), max SUV 18.9 --1.0 cm short axis AP window node (series 4/image 70) --1.3 cm short axis low right paratracheal node (series 4/ image 73), max SUV 4.2 Heart is normal in size. No pericardial effusion. Three-vessel coronary atherosclerosis. Mild atherosclerotic calcifications of the aortic arch. ABDOMEN/PELVIS No abnormal hypermetabolic activity within the liver, pancreas, adrenal glands, or spleen. 3.7 cm cyst in the left hepatic lobe (series 4/image 108), benign. 6 mm nonobstructing right lower pole renal calculus (series 4/image 132). Atherosclerotic calcifications of the abdominal aorta and branch vessels. Ectasia of the infrarenal abdominal aorta, measuring 3.0 x 2.6 cm (series 4/image 144). Prostatomegaly. Mild bladder wall thickening with possible nodularity versus a bladder diverticulum (series 4/image 188). No hypermetabolic lymph nodes in the abdomen or pelvis. SKELETON No focal hypermetabolic activity to suggest skeletal metastasis. IMPRESSION: 3.6 cm hypermetabolic left lower lobe mass, compatible with primary bronchogenic neoplasm. Suspected 1.7 cm left upper lobe pulmonary metastasis. Associated  mediastinal nodal metastases, including a dominant 2.1 cm short axis prevascular node. No evidence of metastatic disease in the abdomen/pelvis. Mild irregular left anterior bladder wall thickening, favored to reflect a bladder diverticulum. Consider cystoscopic correlation as clinically warranted. Electronically Signed   By: Julian Hy M.D.   On: 09/05/2015 12:00   Ct Biopsy  09/13/2015  INDICATION: Left lower lobe lung mass EXAM: CT BIOPSY MEDICATIONS: None. ANESTHESIA/SEDATION: Fentanyl 2 mcg IV; Versed 100 mg IV Moderate Sedation Time:  15 The patient was continuously monitored during the procedure by the interventional radiology nurse under my direct supervision. FLUOROSCOPY TIME:  Fluoroscopy Time:  minutes  seconds ( mGy). COMPLICATIONS: None immediate. PROCEDURE: Informed written consent was obtained from the patient after a thorough discussion of the procedural risks, benefits and alternatives. All questions were addressed. Maximal Sterile Barrier Technique was utilized including caps, mask, sterile gowns, sterile gloves, sterile drape, hand hygiene and skin antiseptic. A timeout was performed prior to the initiation of the procedure. Under CT guidance, a(n) 17 gauge guide needle was advanced into the left lower lobe lung mass. Subsequently 3 18 gauge core biopsies were obtained. Biocintry device was deployed. The guide needle was removed. Post biopsy images demonstrate a tiny pneumothorax.  Patient tolerated the procedure well without complication. Vital sign monitoring by nursing staff during the procedure will continue as patient is in the special procedures unit for post procedure observation. FINDINGS: The images document guide needle placement within the left lower lobe lung mass. Post biopsy images demonstrate a tiny pneumothorax. IMPRESSION: Successful left lower lobe lung biopsy. Eight tiny pneumothorax resolved. Electronically Signed   By: Marybelle Killings M.D.   On: 09/13/2015 14:27       IMPRESSION: Stage IIIA poorly differentiated non-small cell carcinoma of the left lung.  We discussed his diagnosis and stage. We discussed definitive chemoradiation in the management of her disease. We discussed 6 weeks of treatment as an outpatient. We discussed the process of CT simulation and the placement of tattoos. We discussed dysphagia, weight loss, and fatigue as the acute side effects of radiation. We discussed damage to critical normal structures in the chest as well as the spinal cord as possible but improbably. We will schedule him for CT sim next week and begin treatments as soon as possible. I encouraged him to start eating and gaining weight.   PLAN: Dr. Julien Nordmann has ordered an MRI of the brain to complete his staging workup. CT simulation is scheduled on 10/02/15 at 1PM. The patient is tentalively scheduled to begin chemotherapy on 10/08/15 along with radiation therapy.  The patient signed a consent form and this was placed in his medical chart.     ------------------------------------------------  Blair Promise, PhD, MD  This document serves as a record of services personally performed by Gery Pray, MD. It was created on his behalf by Darcus Austin, a trained medical scribe. The creation of this record is based on the scribe's personal observations and the provider's statements to them. This document has been checked and approved by the attending provider.

## 2015-09-27 NOTE — Progress Notes (Signed)
[facility] [phone]   [phone] Multidisciplinary thoracic oncology clinic  CONSULT NOTE  REFERRING PHYSICIAN: Dr. Estelle June  REASON FOR CONSULTATION:  82 years old white male recently diagnosed with lung cancer  HPI Cleophus Villers is a 82 y.o. male with past medical history significant for dyslipidemia, osteoarthritis as well as long history of smoking but quit 5 years ago. In early March 2016 the patient presented to his primary care physician complaining of is nonexertional chest pain worse on the left side as well as shortness of breath started several weeks before. He had cardiac workup performed at that time under the care of Dr. Johnsie Cancel which was unremarkable. CT angiogram of the chest was performed on 08/23/2015 and it showed no evidence for pulmonary embolism. There was a mass arising in the posterior segment of the left lower lobe measuring 3.3 x 3.3 cm. There is a second nodular lesion in the inferior lingula adjacent to the left heart border measuring 1.9 x 1.4 cm. There was also enlarged lymph node at the AP window on the left measuring 2.9 x 2.1 cm and a lymph node immediately anterior to the carina in the midline measuring 2.1 x 1.7 cm there was also subcarinal lymph node measuring 1.8 x 1.3 cm. There are also several smaller mediastinal lymph nodes elsewhere. A PET scan was performed on 09/05/2015 and it showed 2.6 cm hypermetabolic left lower lobe mass compatible with primary bronchogenic neoplasm. There was also suspected 1.7 cm left upper lobe hypermetabolic pulmonary metastasis and associated mediastinal nodal metastasis including a dominant 2.1 cm short axis prevascular node. There was no evidence of metastatic disease in the abdomen/pelvis. The patient was seen by Dr. Chase Caller and CT guided core biopsy of the left lower lobe lung mass was performed by interventional radiology on 09/13/2015. The final pathology (Accession:  [id number]) showed poorly differentiated squamous cell carcinoma. PDL 1 test was performed and it showed 35% expression. Dr. Chase Caller kindly referred the patient to the multidisciplinary thoracic oncology clinic today for evaluation and recommendation regarding treatment of his condition. When seen today the patient is feeling fine except for the central chest pressure as well as pain at the left lower rib cage. He also has shortness breath with exertion and mild cough but no hemoptysis. He denied having any significant weight loss or night sweats. He denied having any nausea, vomiting, diarrhea or constipation. He has frontal headache but no visual changes except increased lacrimation. Family history father died from heart attack, mother died from old age. The patient is married and was accompanied by his wife, Mickel Baas. She was recently diagnosed with breast cancer. He used to work as a Games developer. He has a history of smoking less than one pack per day for around 50 years and quit 5 years ago. He also during glass of wine every night. No history of drug abuse.  HPI  Past Medical History  Diagnosis Date  . Arthritis   . Spondylosis of cervical joint   . Lateral epicondylitis (tennis elbow)   . Hemorrhoids   . Cancer ([place])     basal cell on right temple    Past Surgical History  Procedure Laterality Date  . Colonoscopy  2003  . Knee arthroscopy  2006    right    Family History  Problem Relation Age of Onset  . Coronary artery disease    . Hypertension Mother   . Heart disease Mother     Social History  Social History  Substance Use Topics  . Smoking status: Former Smoker -- 0.20 packs/day for 62 years    Types: Cigarettes    Quit date: 03/10/2011  . Smokeless tobacco: Never Used     Comment: smokes 1 ciagettes occasionally  . Alcohol Use: 4.2 oz/week    7 Glasses of wine per week     Comment: 1-2 glasses of wine a day    No Known Allergies  Current Outpatient  Prescriptions  Medication Sig Dispense Refill  . aspirin 81 MG EC tablet Take 81 mg by mouth every other day. Reported on 09/11/2015    . fish oil-omega-3 fatty acids 1000 MG capsule Take 2 g by mouth 2 (two) times daily.      Marland Kitchen glucosamine-chondroitin 500-400 MG tablet Take 1 tablet by mouth 2 (two) times daily. Reported on 09/11/2015    . LORazepam (ATIVAN) 0.5 MG tablet Take 1 tablet (0.5 mg total) by mouth 2 (two) times daily as needed for anxiety. (Patient taking differently: Take 0.5 mg by mouth daily as needed for anxiety. ) 30 tablet 2  . omeprazole (PRILOSEC) 20 MG capsule Take 20 mg by mouth as needed (heartburn).     Marland Kitchen ibuprofen (ADVIL,MOTRIN) 200 MG tablet Take 400 mg by mouth daily as needed for moderate pain. Reported on 09/27/2015     No current facility-administered medications for this visit.    Review of Systems  Constitutional: negative Eyes: positive for Increased lacrimation Ears, nose, mouth, throat, and face: negative Respiratory: positive for cough and dyspnea on exertion Cardiovascular: negative Gastrointestinal: negative Genitourinary:negative Integument/breast: negative Hematologic/lymphatic: negative Musculoskeletal:negative Neurological: negative Behavioral/Psych: negative Endocrine: negative Allergic/Immunologic: negative  Physical Exam  TKW:IOXBD, healthy, no distress, well nourished, well developed and anxious SKIN: skin color, texture, turgor are normal, no rashes or significant lesions HEAD: Normocephalic, No masses, lesions, tenderness or abnormalities EYES: normal, PERRLA, Conjunctiva are pink and non-injected EARS: External ears normal, Canals clear OROPHARYNX:no exudate, no erythema and lips, buccal mucosa, and tongue normal  NECK: supple, no adenopathy, no JVD LYMPH:  no palpable lymphadenopathy, no hepatosplenomegaly LUNGS: clear to auscultation , and palpation HEART: regular rate & rhythm, no murmurs and no gallops ABDOMEN:abdomen soft,  non-tender, normal bowel sounds and no masses or organomegaly BACK: Back symmetric, no curvature., No CVA tenderness EXTREMITIES:no joint deformities, effusion, or inflammation, no edema, no skin discoloration  NEURO: alert & oriented x 3 with fluent speech, no focal motor/sensory deficits  PERFORMANCE STATUS: ECOG 1  LABORATORY DATA: Lab Results  Component Value Date   WBC 10.2 09/27/2015   HGB 12.9* 09/27/2015   HCT 38.8 09/27/2015   MCV 85.8 09/27/2015   PLT 219 09/27/2015      Chemistry      Component Value Date/Time   NA 138 09/27/2015 1330   NA 135 08/22/2015 1443   K 4.0 09/27/2015 1330   K 4.2 08/22/2015 1443   CL 103 08/22/2015 1443   CO2 23 09/27/2015 1330   CO2 25 08/22/2015 1443   BUN 18.0 09/27/2015 1330   BUN 19 08/22/2015 1443   CREATININE 0.9 09/27/2015 1330   CREATININE 0.84 08/22/2015 1443   CREATININE 0.83 07/09/2015 1520      Component Value Date/Time   CALCIUM 8.8 09/27/2015 1330   CALCIUM 8.6 08/22/2015 1443   ALKPHOS 87 09/27/2015 1330   ALKPHOS 79 07/09/2015 1520   AST 13 09/27/2015 1330   AST 14 07/09/2015 1520   ALT 10 09/27/2015 1330   ALT 10 07/09/2015  1520   BILITOT 0.39 09/27/2015 1330   BILITOT 0.3 07/09/2015 1520       RADIOGRAPHIC STUDIES: Dg Chest 1 View  09/14/2015  CLINICAL DATA:  82 year old male status post CT-guided left lower lung biopsy yesterday with tiny left pneumothorax. Subsequent encounter. EXAM: CHEST 1 VIEW COMPARISON:  09/13/2015 and earlier. FINDINGS: Stable lung volumes. No convincing pneumothorax. Stable cardiac size and mediastinal contours. Stable radiographic appearance of the left lower lobe lung nodule with scattered bilateral curvilinear scarring. No evidence of pleural effusion. No acute pulmonary opacity. Stable visualized osseous structures. IMPRESSION: Tiny left pneumothorax has resolved. No new cardiopulmonary abnormality. Electronically Signed   By: Genevie Ann M.D.   On: 09/14/2015 09:07   Dg Chest 1  View  09/13/2015  CLINICAL DATA:  Post biopsy of left lower lobe lung lesion EXAM: CHEST 1 VIEW COMPARISON:  Chest x-ray of 09/13/2015 and CT of 08/23/2015 FINDINGS: There still appears to be a tiny left apical pneumothorax present of less than 5%. The nodule at the left lung base again is noted. The right lung is clear. IMPRESSION: Tiny residual left apical pneumothorax of less than 5%. Electronically Signed   By: Ivar Drape M.D.   On: 09/13/2015 14:20   Dg Chest 1 View  09/13/2015  CLINICAL DATA:  Status post biopsy of the left lung. EXAM: CHEST 1 VIEW COMPARISON:  08/21/2015 FINDINGS: Trace left apical pneumothorax, less than 5%. Pneumothorax is expected given the last CT image during CT fluoroscopy. There is chronic interstitial coarsening at the bases. Normal heart size and mediastinal contours. No evidence of alveolar hemorrhage or hemothorax. Left basilar lung nodule Dr Barbie Banner is already aware of these results. IMPRESSION: Trace left apical pneumothorax, less than 5%. Electronically Signed   By: Monte Fantasia M.D.   On: 09/13/2015 13:37   Nm Pet Image Initial (pi) Skull Base To Thigh  09/05/2015  CLINICAL DATA:  Initial treatment strategy for left lung nodule. EXAM: NUCLEAR MEDICINE PET SKULL BASE TO THIGH TECHNIQUE: 8.54 mCi F-18 FDG was injected intravenously. Full-ring PET imaging was performed from the skull base to thigh after the radiotracer. CT data was obtained and used for attenuation correction and anatomic localization. FASTING BLOOD GLUCOSE:  Value: 103 mg/dl COMPARISON:  CTA chest dated 08/23/2015 FINDINGS: NECK No hypermetabolic lymph nodes in the neck. CHEST 2.9 x 3.6 cm left lower lobe mass, max SUV 23.1, compatible with primary bronchogenic neoplasm. Additional 1.7 x 1.3 cm cavitary nodule in the inferior left upper lobe (series 8/ image 48), max SUV 7.8, suspicious for metastasis. Associated mediastinal lymphadenopathy, including: --2.1 cm short axis prevascular node (series 4/image  70), max SUV 18.9 --1.0 cm short axis AP window node (series 4/image 70) --1.3 cm short axis low right paratracheal node (series 4/ image 73), max SUV 4.2 Heart is normal in size. No pericardial effusion. Three-vessel coronary atherosclerosis. Mild atherosclerotic calcifications of the aortic arch. ABDOMEN/PELVIS No abnormal hypermetabolic activity within the liver, pancreas, adrenal glands, or spleen. 3.7 cm cyst in the left hepatic lobe (series 4/image 108), benign. 6 mm nonobstructing right lower pole renal calculus (series 4/image 132). Atherosclerotic calcifications of the abdominal aorta and branch vessels. Ectasia of the infrarenal abdominal aorta, measuring 3.0 x 2.6 cm (series 4/image 144). Prostatomegaly. Mild bladder wall thickening with possible nodularity versus a bladder diverticulum (series 4/image 188). No hypermetabolic lymph nodes in the abdomen or pelvis. SKELETON No focal hypermetabolic activity to suggest skeletal metastasis. IMPRESSION: 3.6 cm hypermetabolic left lower lobe mass, compatible  with primary bronchogenic neoplasm. Suspected 1.7 cm left upper lobe pulmonary metastasis. Associated mediastinal nodal metastases, including a dominant 2.1 cm short axis prevascular node. No evidence of metastatic disease in the abdomen/pelvis. Mild irregular left anterior bladder wall thickening, favored to reflect a bladder diverticulum. Consider cystoscopic correlation as clinically warranted. Electronically Signed   By: Julian Hy M.D.   On: 09/05/2015 12:00   Ct Biopsy  09/13/2015  INDICATION: Left lower lobe lung mass EXAM: CT BIOPSY MEDICATIONS: None. ANESTHESIA/SEDATION: Fentanyl 2 mcg IV; Versed 100 mg IV Moderate Sedation Time:  15 The patient was continuously monitored during the procedure by the interventional radiology nurse under my direct supervision. FLUOROSCOPY TIME:  Fluoroscopy Time:  minutes  seconds ( mGy). COMPLICATIONS: None immediate. PROCEDURE: Informed written consent was  obtained from the patient after a thorough discussion of the procedural risks, benefits and alternatives. All questions were addressed. Maximal Sterile Barrier Technique was utilized including caps, mask, sterile gowns, sterile gloves, sterile drape, hand hygiene and skin antiseptic. A timeout was performed prior to the initiation of the procedure. Under CT guidance, a(n) 17 gauge guide needle was advanced into the left lower lobe lung mass. Subsequently 3 18 gauge core biopsies were obtained. Biocintry device was deployed. The guide needle was removed. Post biopsy images demonstrate a tiny pneumothorax. Patient tolerated the procedure well without complication. Vital sign monitoring by nursing staff during the procedure will continue as patient is in the special procedures unit for post procedure observation. FINDINGS: The images document guide needle placement within the left lower lobe lung mass. Post biopsy images demonstrate a tiny pneumothorax. IMPRESSION: Successful left lower lobe lung biopsy. Eight tiny pneumothorax resolved. Electronically Signed   By: Marybelle Killings M.D.   On: 09/13/2015 14:27    ASSESSMENT: This is a very pleasant 82 years old white male recently diagnosed with a stage IIIa (T3, N2, M0) non-small cell lung cancer, poorly differentiated squamous cell carcinoma diagnosed in February 2016 with PDL 1 expression of 35%, presented with large left lower lobe lung mass in addition to left upper lobe lung nodule and mediastinal lymphadenopathy.   PLAN: I had a lengthy discussion with the patient and his wife today about his current disease stage, prognosis and treatment options. I will complete the staging workup by ordering a MRI of the brain to rule out brain metastasis. I discussed with the patient his treatment options including a course of concurrent chemoradiation. I recommended for the patient a course of concurrent chemoradiation with weekly carboplatin for AUC of 2 and paclitaxel  45 MG/M2. I discussed with the patient adverse effect of the chemotherapy including but not limited to alopecia, myelosuppression, nausea and vomiting, peripheral neuropathy, liver or renal dysfunction. I will arrange for the patient to have a chemotherapy education class before starting the first dose of his chemotherapy. I will call his pharmacy with prescription for Compazine 10 mg by mouth every 6 hours as needed for nausea. The patient is expected to start the first dose of his concurrent chemoradiation on 10/08/2015. I will see him back for follow-up visit in 3 weeks for evaluation and management of any adverse effect of his treatment. He was seen later today by Dr. Sondra Come for evaluation and discussion of the radiotherapy option. The patient was seen during the multidisciplinary thoracic oncology clinic today by medical oncology, radiation oncology, thoracic navigator, social worker and physical therapist. He was advised to call immediately if he has any concerning symptoms in the interval. The patient  voices understanding of current disease status and treatment options and is in agreement with the current care plan.  All questions were answered. The patient knows to call the clinic with any problems, questions or concerns. We can certainly see the patient much sooner if necessary.  Thank you so much for allowing me to participate in the care of [place]. I will continue to follow up the patient with you and assist in his care.  I spent 55 minutes counseling the patient face to face. The total time spent in the appointment was 80 minutes.  Disclaimer: This note was dictated with voice recognition software. Similar sounding words can inadvertently be transcribed and may not be corrected upon review.   Batina Dougan K. September 27, 2015, 3:01 PM

## 2015-09-27 NOTE — Telephone Encounter (Signed)
Gave pt appt & avs

## 2015-09-27 NOTE — Progress Notes (Signed)
[facility] Clinical Social Work  Clinical Social Work met with patient/family at [employer] appointment to offer support and assess for psychosocial needs.  Patient was accompanied by spouse, Mickel Baas.  Mr. Whitford shared he was feeling scared and anxious regarding cancer diagnosis and treatment.  Patient's spouse indicated patient has "not been positive" about the experience.  CSW validated and normalized patients feelings of fear, anxiety, and loss of control and discussed the importance of positive coping skills and support.  Patient's spouse recently diagnosed with breast cancer and unsure of treatment plan at this time.  CSW discussed patient and spouse supporting each other during this time while also receiving support through counseling at [facility].   Clinical Social Work briefly discussed Clinical Social Work role and [employer] support programs/services.  Clinical Social Work encouraged patient to call with any additional questions or concerns.   Polo Riley, MSW, LCSW, OSW-C Clinical Social Worker [facility] [phone]

## 2015-09-28 ENCOUNTER — Encounter: Payer: Self-pay | Admitting: *Deleted

## 2015-09-28 ENCOUNTER — Telehealth: Payer: Self-pay | Admitting: *Deleted

## 2015-09-28 NOTE — Telephone Encounter (Signed)
Oncology Nurse Navigator Documentation  Oncology Nurse Navigator Flowsheets 09/28/2015  Navigator Encounter Type Telephone  Telephone Outgoing Call  Treatment Phase Follow-up  Barriers/Navigation Needs Education  Education Other  Interventions Education Method  Coordination of Care Appts  Acuity Level 1  Acuity Level 1 Minimal follow up required  Time Spent with Patient 15   Patient's wife called and had a question about upcoming radiation appt.  I called to clarify.  She was asking about Rad Onc appt in the future.  I gave her the phone number to call for help with Rad Onc appt.  She was thankful for the help.

## 2015-09-28 NOTE — Progress Notes (Signed)
Oncology Nurse Navigator Documentation  Oncology Nurse Navigator Flowsheets 09/28/2015  Navigator Encounter Type Clinic/MDC  Abnormal Finding Date 08/23/2015  Confirmed Diagnosis Date 09/13/2015  Treatment Initiated Date 10/02/2015  Patient Visit Type Other  Treatment Phase Pre-Tx/Tx Discussion  Barriers/Navigation Needs Coordination of Care;Education  Education Newly Diagnosed Cancer Education;Other  Interventions Coordination of Care;Education Method  Coordination of Care Appts  Education Method Verbal;Written  Support Groups/Services Other  Acuity Level 2  Acuity Level 1 Initial guidance, education and coordination as needed  Time Spent with Patient 30   Spoke with patient and wife yesterday at [facility].  Gave and explained information on dx, treatment, support services, and next steps.  Appointments: 4/26 MRI Brain 5/1 Chemo Education 5/1 1st Chemo

## 2015-10-02 ENCOUNTER — Ambulatory Visit: Payer: Medicare Other | Admitting: Cardiovascular Disease

## 2015-10-02 ENCOUNTER — Ambulatory Visit
Admission: RE | Admit: 2015-10-02 | Discharge: 2015-10-02 | Disposition: A | Discharge status: Home / Self Care | Payer: Medicare Other | Source: Ambulatory Visit | Attending: Radiation Oncology | Admitting: Radiation Oncology

## 2015-10-02 DIAGNOSIS — Z510 Encounter for antineoplastic radiation therapy: Secondary | ICD-10-CM | POA: Diagnosis not present

## 2015-10-02 DIAGNOSIS — C3492 Malignant neoplasm of unspecified part of left bronchus or lung: Secondary | ICD-10-CM | POA: Diagnosis present

## 2015-10-02 NOTE — Progress Notes (Signed)
  Radiation Oncology         [phone] ________________________________  Name: Jason Summers MRN: [id number]  Date: 10/02/2015  DOB: 1931/11/26  SIMULATION AND TREATMENT PLANNING NOTE    ICD-9-CM ICD-10-CM   1. Non-small cell cancer of left lung (HCC) 162.9 C34.92     DIAGNOSIS:  Stage IIIA (T3, N2, Mx) poorly differentiated non-small cell carcinoma of the left lung  NARRATIVE:  The patient was brought to the [facility].  Identity was confirmed.  All relevant records and images related to the planned course of therapy were reviewed.  The patient freely provided informed written consent to proceed with treatment after reviewing the details related to the planned course of therapy. The consent form was witnessed and verified by the simulation staff.  Then, the patient was set-up in a stable reproducible  supine position for radiation therapy.  CT images were obtained.  Surface markings were placed.  The CT images were loaded into the planning software.  Then the target and avoidance structures were contoured.  Treatment planning then occurred.  The radiation prescription was entered and confirmed.  Then, I designed and supervised the construction of a total of multiple (see composite plan in [place]) medically necessary complex treatment devices.  I have requested : 3D Simulation  I have requested a DVH of the following structures: heart, lungs, esophagus, spinal cord.  I have ordered:dose cal.  PLAN:  The patient will receive 60 Gy in 30 fractions Along with radiosensitizing chemotherapy.  ________________________________   Special treatment procedure note  Patient will be receiving radiosensitizing chemotherapy throughout his chest radiation treatments. Given the increased potential for toxicities as well as the necessity for close monitoring of the patient and bloodwork, this constitutes a special treatment procedure.  -----------------------  Blair Promise, PhD,  MD  This document serves as a record of services personally performed by Gery Pray, MD. It was created on his behalf by Darcus Austin, a trained medical scribe. The creation of this record is based on the scribe's personal observations and the provider's statements to them. This document has been checked and approved by the attending provider.

## 2015-10-03 ENCOUNTER — Ambulatory Visit (HOSPITAL_COMMUNITY)
Admission: RE | Admit: 2015-10-03 | Discharge: 2015-10-03 | Disposition: A | Discharge status: Home / Self Care | Payer: Medicare Other | Source: Ambulatory Visit | Attending: Internal Medicine | Admitting: Internal Medicine

## 2015-10-03 DIAGNOSIS — C3492 Malignant neoplasm of unspecified part of left bronchus or lung: Secondary | ICD-10-CM | POA: Diagnosis not present

## 2015-10-03 MED ORDER — GADOBENATE DIMEGLUMINE 529 MG/ML IV SOLN
15.0000 mL | Freq: Once | INTRAVENOUS | Status: AC | PRN
  Administered 2015-10-03: 15 mL via INTRAVENOUS

## 2015-10-05 ENCOUNTER — Telehealth: Payer: Self-pay | Admitting: *Deleted

## 2015-10-05 ENCOUNTER — Other Ambulatory Visit: Payer: Self-pay | Admitting: Medical Oncology

## 2015-10-05 DIAGNOSIS — C3492 Malignant neoplasm of unspecified part of left bronchus or lung: Secondary | ICD-10-CM

## 2015-10-05 NOTE — Telephone Encounter (Signed)
Oncology Nurse Navigator Documentation  Oncology Nurse Navigator Flowsheets 10/05/2015  Navigator Encounter Type Telephone  Telephone Outgoing Call  Treatment Phase Pre-Tx/Tx Discussion  Barriers/Navigation Needs Education  Education Other  Interventions Education Method  Coordination of Care Other  Education Method Verbal  Acuity Level 2  Time Spent with Patient 15   Jason Summers called.  She had questions about her husbands first day of chemo.  I clarified questions.

## 2015-10-07 DIAGNOSIS — Z510 Encounter for antineoplastic radiation therapy: Secondary | ICD-10-CM | POA: Diagnosis not present

## 2015-10-08 ENCOUNTER — Encounter: Payer: Self-pay | Admitting: *Deleted

## 2015-10-08 ENCOUNTER — Other Ambulatory Visit (HOSPITAL_BASED_OUTPATIENT_CLINIC_OR_DEPARTMENT_OTHER): Payer: Medicare Other

## 2015-10-08 ENCOUNTER — Ambulatory Visit
Admission: RE | Admit: 2015-10-08 | Discharge: 2015-10-08 | Disposition: A | Discharge status: Home / Self Care | Payer: Medicare Other | Source: Ambulatory Visit | Attending: Radiation Oncology | Admitting: Radiation Oncology

## 2015-10-08 ENCOUNTER — Ambulatory Visit (HOSPITAL_BASED_OUTPATIENT_CLINIC_OR_DEPARTMENT_OTHER): Payer: Medicare Other

## 2015-10-08 ENCOUNTER — Other Ambulatory Visit: Payer: Medicare Other

## 2015-10-08 VITALS — BP 112/70 | HR 73 | Temp 98.5°F | Resp 20

## 2015-10-08 DIAGNOSIS — C3492 Malignant neoplasm of unspecified part of left bronchus or lung: Secondary | ICD-10-CM

## 2015-10-08 DIAGNOSIS — Z5111 Encounter for antineoplastic chemotherapy: Secondary | ICD-10-CM

## 2015-10-08 DIAGNOSIS — Z510 Encounter for antineoplastic radiation therapy: Secondary | ICD-10-CM | POA: Diagnosis not present

## 2015-10-08 LAB — COMPREHENSIVE METABOLIC PANEL
ALT: 14 U/L (ref 0–55)
ANION GAP: 6 meq/L (ref 3–11)
AST: 14 U/L (ref 5–34)
Albumin: 3.2 g/dL — ABNORMAL LOW (ref 3.5–5.0)
Alkaline Phosphatase: 86 U/L (ref 40–150)
BILIRUBIN TOTAL: 0.33 mg/dL (ref 0.20–1.20)
BUN: 17.1 mg/dL (ref 7.0–26.0)
CO2: 25 meq/L (ref 22–29)
Calcium: 8.7 mg/dL (ref 8.4–10.4)
Chloride: 105 mEq/L (ref 98–109)
Creatinine: 0.9 mg/dL (ref 0.7–1.3)
EGFR: 80 mL/min/{1.73_m2} — AB (ref >90)
Glucose: 115 mg/dl (ref 70–140)
POTASSIUM: 4.3 meq/L (ref 3.5–5.1)
Sodium: 136 mEq/L (ref 136–145)
TOTAL PROTEIN: 7 g/dL (ref 6.4–8.3)

## 2015-10-08 LAB — CBC WITH DIFFERENTIAL/PLATELET
BASO%: 0.6 % (ref 0.0–2.0)
BASOS ABS: 0.1 10*3/uL (ref 0.0–0.1)
EOS ABS: 0.1 10*3/uL (ref 0.0–0.5)
EOS%: 0.9 % (ref 0.0–7.0)
HCT: 38.4 % (ref 38.4–49.9)
HGB: 12.6 g/dL — ABNORMAL LOW (ref 13.0–17.1)
LYMPH%: 15.1 % (ref 14.0–49.0)
MCH: 28.2 pg (ref 27.2–33.4)
MCHC: 32.7 g/dL (ref 32.0–36.0)
MCV: 86.2 fL (ref 79.3–98.0)
MONO#: 1 10*3/uL — AB (ref 0.1–0.9)
MONO%: 10.6 % (ref 0.0–14.0)
NEUT%: 72.8 % (ref 39.0–75.0)
NEUTROS ABS: 6.8 10*3/uL — AB (ref 1.5–6.5)
PLATELETS: 238 10*3/uL (ref 140–400)
RBC: 4.45 10*6/uL (ref 4.20–5.82)
RDW: 12.9 % (ref 11.0–14.6)
WBC: 9.4 10*3/uL (ref 4.0–10.3)
lymph#: 1.4 10*3/uL (ref 0.9–3.3)

## 2015-10-08 MED ORDER — DIPHENHYDRAMINE HCL 50 MG/ML IJ SOLN
50.0000 mg | Freq: Once | INTRAMUSCULAR | Status: AC
  Administered 2015-10-08: 50 mg via INTRAVENOUS

## 2015-10-08 MED ORDER — SODIUM CHLORIDE 0.9 % IV SOLN
Freq: Once | INTRAVENOUS | Status: AC
  Administered 2015-10-08: 13:00:00 via INTRAVENOUS

## 2015-10-08 MED ORDER — PALONOSETRON HCL INJECTION 0.25 MG/5ML
0.2500 mg | Freq: Once | INTRAVENOUS | Status: AC
  Administered 2015-10-08: 0.25 mg via INTRAVENOUS

## 2015-10-08 MED ORDER — SODIUM CHLORIDE 0.9 % IV SOLN
45.0000 mg/m2 | Freq: Once | INTRAVENOUS | Status: AC
  Administered 2015-10-08: 90 mg via INTRAVENOUS
  Filled 2015-10-08: qty 15

## 2015-10-08 MED ORDER — SODIUM CHLORIDE 0.9 % IV SOLN
20.0000 mg | Freq: Once | INTRAVENOUS | Status: AC
  Administered 2015-10-08: 20 mg via INTRAVENOUS
  Filled 2015-10-08: qty 2

## 2015-10-08 MED ORDER — PALONOSETRON HCL INJECTION 0.25 MG/5ML
INTRAVENOUS | Status: AC
  Filled 2015-10-08: qty 5

## 2015-10-08 MED ORDER — SODIUM CHLORIDE 0.9 % IV SOLN
175.0000 mg | Freq: Once | INTRAVENOUS | Status: AC
  Administered 2015-10-08: 180 mg via INTRAVENOUS
  Filled 2015-10-08: qty 18

## 2015-10-08 MED ORDER — FAMOTIDINE IN NACL 20-0.9 MG/50ML-% IV SOLN
20.0000 mg | Freq: Once | INTRAVENOUS | Status: AC
  Administered 2015-10-08: 20 mg via INTRAVENOUS

## 2015-10-08 MED ORDER — DIPHENHYDRAMINE HCL 50 MG/ML IJ SOLN
INTRAMUSCULAR | Status: AC
  Filled 2015-10-08: qty 1

## 2015-10-08 MED ORDER — FAMOTIDINE IN NACL 20-0.9 MG/50ML-% IV SOLN
INTRAVENOUS | Status: AC
  Filled 2015-10-08: qty 50

## 2015-10-08 NOTE — Patient Instructions (Signed)
[facility] Discharge Instructions for Patients Receiving Chemotherapy  Today you received the following chemotherapy agents: Taxol & Carboplatin.  To help prevent nausea and vomiting after your treatment, we encourage you to take your nausea medication, Compazine, as prescribed.  If you develop nausea and vomiting that is not controlled by your nausea medication, call the clinic.   BELOW ARE SYMPTOMS THAT SHOULD BE REPORTED IMMEDIATELY:  *FEVER GREATER THAN 100.5 F  *CHILLS WITH OR WITHOUT FEVER  NAUSEA AND VOMITING THAT IS NOT CONTROLLED WITH YOUR NAUSEA MEDICATION  *UNUSUAL SHORTNESS OF BREATH  *UNUSUAL BRUISING OR BLEEDING  TENDERNESS IN MOUTH AND THROAT WITH OR WITHOUT PRESENCE OF ULCERS  *URINARY PROBLEMS  *BOWEL PROBLEMS  UNUSUAL RASH Items with * indicate a potential emergency and should be followed up as soon as possible.  Feel free to call the clinic you have any questions or concerns. The clinic phone number is [phone].  Please show the [place] at check-in to the Emergency Department and triage nurse.

## 2015-10-08 NOTE — Progress Notes (Signed)
Spent time in chemo class with patient and his wife reviewing side effects of Carboplatin and Taxol.  See education flowsheet

## 2015-10-09 ENCOUNTER — Ambulatory Visit
Admission: RE | Admit: 2015-10-09 | Discharge: 2015-10-09 | Disposition: A | Discharge status: Home / Self Care | Payer: Medicare Other | Source: Ambulatory Visit | Attending: Radiation Oncology | Admitting: Radiation Oncology

## 2015-10-09 ENCOUNTER — Encounter: Payer: Self-pay | Admitting: Radiation Oncology

## 2015-10-09 ENCOUNTER — Telehealth: Payer: Self-pay | Admitting: Medical Oncology

## 2015-10-09 VITALS — BP 127/75 | HR 78 | Temp 98.0°F | Ht 70.0 in | Wt 169.7 lb

## 2015-10-09 DIAGNOSIS — C3492 Malignant neoplasm of unspecified part of left bronchus or lung: Secondary | ICD-10-CM

## 2015-10-09 DIAGNOSIS — R05 Cough: Secondary | ICD-10-CM | POA: Insufficient documentation

## 2015-10-09 DIAGNOSIS — Z510 Encounter for antineoplastic radiation therapy: Secondary | ICD-10-CM | POA: Diagnosis not present

## 2015-10-09 MED ORDER — RADIAPLEXRX EX GEL
Freq: Once | CUTANEOUS | Status: AC
  Administered 2015-10-09: 10:00:00 via TOPICAL

## 2015-10-09 NOTE — Telephone Encounter (Signed)
Pt doing well since chemo yesterday. I told him to call if he has any concerns or problems.

## 2015-10-09 NOTE — Progress Notes (Signed)
Jason Summers has completed 2 fractions to his chest.  He denies having pain.  He reports having an dry cough.  He denies having shortness of breath.  He has noticed a change in his voice over the past 2 days.  BP 127/75 mmHg  Pulse 78  Temp(Src) 98 F (36.7 C) (Oral)  Ht 5' 10" (1.778 m)  Wt 169 lb 11.2 oz (76.975 kg)  BMI 24.35 kg/m2  SpO2 99%   Wt Readings from Last 3 Encounters:  10/09/15 169 lb 11.2 oz (76.975 kg)  09/27/15 168 lb 4.8 oz (76.34 kg)  09/25/15 169 lb (76.658 kg)

## 2015-10-09 NOTE — Progress Notes (Signed)
  Radiation Oncology         [phone]) [phone] ________________________________  Name: Jason Summers MRN: [id number]  Date: 10/09/2015  DOB: 1932/01/04  Weekly Radiation Therapy Management     ICD-9-CM ICD-10-CM   1. 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 . . . . . . . The patient presents for routine under treatment assessment.                              Jason Summers has completed 2 fractions to his chest. He denies having pain or shortness of breath. He reports having an dry cough and he has noticed a change in his voice over the past 2 days. He started chemotherapy yesterday and the patient states he tolerated it well.                                 Set-up films were reviewed.                                 The chart was checked. Physical Findings. . .  height is 5' 10" (1.778 m) and weight is 169 lb 11.2 oz (76.975 kg). His oral temperature is 98 F (36.7 C). His blood pressure is 127/75 and his pulse is 78. His oxygen saturation is 99%. . Weight essentially stable.  No significant changes. Lungs are clear to auscultation bilaterally. Heart has regular rate and rhythm. No palpable cervical, supraclavicular, or axillary adenopathy. Abdomen soft, non-tender, normal bowel sounds. Impression . . . . . . . The patient is tolerating radiation. I informed the patient that he might develop a sore throat. I also notified the patient we may switch his treatment from 3D to IMRT to lower the esophageal dose and to improve coverage.  Plan . . . . . . . . . . . . Continue treatment as planned. ________________________________   Blair Promise, PhD, MD  This document serves as a record of services personally performed by Gery Pray, MD. It was created on his behalf by Darcus Austin, a trained medical scribe. The creation of this  record is based on the scribe's personal observations and the provider's statements to them. This document has been checked and approved by the attending provider.

## 2015-10-09 NOTE — Progress Notes (Signed)
Pt here for patient teaching.  Pt given Radiation and You booklet, skin care instructions and Radiaplex gel. Pt reports they have not watched the Radiation Therapy Education video and has been given the link to watch at home.  Reviewed areas of pertinence such as fatigue, skin changes, throat changes and cough . Pt able to give teach back of to pat skin and use unscented/gentle soap,apply Radiaplex bid and avoid applying anything to skin within 4 hours of treatment. Pt demonstrated understanding and verbalizes understanding of information given and will contact nursing with any questions or concerns.     [email or web address]

## 2015-10-10 ENCOUNTER — Ambulatory Visit
Admission: RE | Admit: 2015-10-10 | Discharge: 2015-10-10 | Disposition: A | Discharge status: Home / Self Care | Payer: Medicare Other | Source: Ambulatory Visit | Attending: Radiation Oncology | Admitting: Radiation Oncology

## 2015-10-10 DIAGNOSIS — Z510 Encounter for antineoplastic radiation therapy: Secondary | ICD-10-CM | POA: Diagnosis not present

## 2015-10-11 ENCOUNTER — Ambulatory Visit
Admission: RE | Admit: 2015-10-11 | Discharge: 2015-10-11 | Disposition: A | Discharge status: Home / Self Care | Payer: Medicare Other | Source: Ambulatory Visit | Attending: Radiation Oncology | Admitting: Radiation Oncology

## 2015-10-11 DIAGNOSIS — Z510 Encounter for antineoplastic radiation therapy: Secondary | ICD-10-CM | POA: Diagnosis not present

## 2015-10-12 ENCOUNTER — Ambulatory Visit
Admission: RE | Admit: 2015-10-12 | Discharge: 2015-10-12 | Disposition: A | Discharge status: Home / Self Care | Payer: Medicare Other | Source: Ambulatory Visit | Attending: Radiation Oncology | Admitting: Radiation Oncology

## 2015-10-12 DIAGNOSIS — Z510 Encounter for antineoplastic radiation therapy: Secondary | ICD-10-CM | POA: Diagnosis not present

## 2015-10-15 ENCOUNTER — Telehealth: Payer: Self-pay | Admitting: *Deleted

## 2015-10-15 ENCOUNTER — Other Ambulatory Visit: Payer: Self-pay | Admitting: *Deleted

## 2015-10-15 ENCOUNTER — Encounter: Payer: Self-pay | Admitting: Internal Medicine

## 2015-10-15 ENCOUNTER — Telehealth: Payer: Self-pay | Admitting: Internal Medicine

## 2015-10-15 ENCOUNTER — Ambulatory Visit (HOSPITAL_BASED_OUTPATIENT_CLINIC_OR_DEPARTMENT_OTHER): Payer: Medicare Other

## 2015-10-15 ENCOUNTER — Other Ambulatory Visit (HOSPITAL_BASED_OUTPATIENT_CLINIC_OR_DEPARTMENT_OTHER): Payer: Medicare Other

## 2015-10-15 ENCOUNTER — Ambulatory Visit
Admission: RE | Admit: 2015-10-15 | Discharge: 2015-10-15 | Disposition: A | Discharge status: Home / Self Care | Payer: Medicare Other | Source: Ambulatory Visit | Attending: Radiation Oncology | Admitting: Radiation Oncology

## 2015-10-15 ENCOUNTER — Ambulatory Visit (HOSPITAL_BASED_OUTPATIENT_CLINIC_OR_DEPARTMENT_OTHER): Payer: Medicare Other | Admitting: Internal Medicine

## 2015-10-15 VITALS — BP 112/65 | HR 78 | Temp 97.9°F | Resp 18 | Ht 70.0 in | Wt 169.5 lb

## 2015-10-15 DIAGNOSIS — Z5111 Encounter for antineoplastic chemotherapy: Secondary | ICD-10-CM

## 2015-10-15 DIAGNOSIS — C3492 Malignant neoplasm of unspecified part of left bronchus or lung: Secondary | ICD-10-CM

## 2015-10-15 DIAGNOSIS — D649 Anemia, unspecified: Secondary | ICD-10-CM | POA: Diagnosis not present

## 2015-10-15 DIAGNOSIS — C3432 Malignant neoplasm of lower lobe, left bronchus or lung: Secondary | ICD-10-CM

## 2015-10-15 DIAGNOSIS — Z510 Encounter for antineoplastic radiation therapy: Secondary | ICD-10-CM | POA: Diagnosis not present

## 2015-10-15 LAB — CBC WITH DIFFERENTIAL/PLATELET
BASO%: 0.5 % (ref 0.0–2.0)
Basophils Absolute: 0 10*3/uL (ref 0.0–0.1)
EOS ABS: 0.1 10*3/uL (ref 0.0–0.5)
EOS%: 1.3 % (ref 0.0–7.0)
HCT: 39.7 % (ref 38.4–49.9)
HEMOGLOBIN: 12.9 g/dL — AB (ref 13.0–17.1)
LYMPH%: 11.7 % — ABNORMAL LOW (ref 14.0–49.0)
MCH: 27.9 pg (ref 27.2–33.4)
MCHC: 32.6 g/dL (ref 32.0–36.0)
MCV: 85.7 fL (ref 79.3–98.0)
MONO#: 0.5 10*3/uL (ref 0.1–0.9)
MONO%: 9.4 % (ref 0.0–14.0)
NEUT%: 77.1 % — ABNORMAL HIGH (ref 39.0–75.0)
NEUTROS ABS: 4.4 10*3/uL (ref 1.5–6.5)
Platelets: 252 10*3/uL (ref 140–400)
RBC: 4.63 10*6/uL (ref 4.20–5.82)
RDW: 12.9 % (ref 11.0–14.6)
WBC: 5.7 10*3/uL (ref 4.0–10.3)
lymph#: 0.7 10*3/uL — ABNORMAL LOW (ref 0.9–3.3)

## 2015-10-15 LAB — COMPREHENSIVE METABOLIC PANEL
ALBUMIN: 3 g/dL — AB (ref 3.5–5.0)
ALK PHOS: 78 U/L (ref 40–150)
ALT: 16 U/L (ref 0–55)
AST: 14 U/L (ref 5–34)
Anion Gap: 8 mEq/L (ref 3–11)
BILIRUBIN TOTAL: 0.3 mg/dL (ref 0.20–1.20)
BUN: 17.9 mg/dL (ref 7.0–26.0)
CO2: 23 mEq/L (ref 22–29)
Calcium: 8.7 mg/dL (ref 8.4–10.4)
Chloride: 106 mEq/L (ref 98–109)
Creatinine: 0.8 mg/dL (ref 0.7–1.3)
EGFR: 82 mL/min/{1.73_m2} — ABNORMAL LOW (ref >90)
GLUCOSE: 109 mg/dL (ref 70–140)
Potassium: 4.2 mEq/L (ref 3.5–5.1)
SODIUM: 137 meq/L (ref 136–145)
TOTAL PROTEIN: 6.8 g/dL (ref 6.4–8.3)

## 2015-10-15 MED ORDER — SODIUM CHLORIDE 0.9 % IV SOLN
180.0000 mg | Freq: Once | INTRAVENOUS | Status: AC
  Administered 2015-10-15: 180 mg via INTRAVENOUS
  Filled 2015-10-15: qty 18

## 2015-10-15 MED ORDER — PALONOSETRON HCL INJECTION 0.25 MG/5ML
INTRAVENOUS | Status: AC
  Filled 2015-10-15: qty 5

## 2015-10-15 MED ORDER — FAMOTIDINE IN NACL 20-0.9 MG/50ML-% IV SOLN
20.0000 mg | Freq: Once | INTRAVENOUS | Status: AC
  Administered 2015-10-15: 20 mg via INTRAVENOUS

## 2015-10-15 MED ORDER — PALONOSETRON HCL INJECTION 0.25 MG/5ML
0.2500 mg | Freq: Once | INTRAVENOUS | Status: AC
  Administered 2015-10-15: 0.25 mg via INTRAVENOUS

## 2015-10-15 MED ORDER — DIPHENHYDRAMINE HCL 50 MG/ML IJ SOLN
50.0000 mg | Freq: Once | INTRAMUSCULAR | Status: AC
  Administered 2015-10-15: 50 mg via INTRAVENOUS

## 2015-10-15 MED ORDER — SODIUM CHLORIDE 0.9 % IV SOLN
Freq: Once | INTRAVENOUS | Status: AC
  Administered 2015-10-15: 13:00:00 via INTRAVENOUS

## 2015-10-15 MED ORDER — SODIUM CHLORIDE 0.9 % IV SOLN
45.0000 mg/m2 | Freq: Once | INTRAVENOUS | Status: AC
  Administered 2015-10-15: 90 mg via INTRAVENOUS
  Filled 2015-10-15: qty 15

## 2015-10-15 MED ORDER — SODIUM CHLORIDE 0.9 % IV SOLN
20.0000 mg | Freq: Once | INTRAVENOUS | Status: AC
  Administered 2015-10-15: 20 mg via INTRAVENOUS
  Filled 2015-10-15: qty 2

## 2015-10-15 MED ORDER — DIPHENHYDRAMINE HCL 50 MG/ML IJ SOLN
INTRAMUSCULAR | Status: AC
  Filled 2015-10-15: qty 1

## 2015-10-15 MED ORDER — FAMOTIDINE IN NACL 20-0.9 MG/50ML-% IV SOLN
INTRAVENOUS | Status: AC
  Filled 2015-10-15: qty 50

## 2015-10-15 NOTE — Patient Instructions (Signed)
[facility] [facility] Discharge Instructions for Patients Receiving Chemotherapy  Today you received the following chemotherapy agents Taxol and Carboplatin. To help prevent nausea and vomiting after your treatment, we encourage you to take your nausea medication as directed.  If you develop nausea and vomiting that is not controlled by your nausea medication, call the clinic.   BELOW ARE SYMPTOMS THAT SHOULD BE REPORTED IMMEDIATELY:  *FEVER GREATER THAN 100.5 F  *CHILLS WITH OR WITHOUT FEVER  NAUSEA AND VOMITING THAT IS NOT CONTROLLED WITH YOUR NAUSEA MEDICATION  *UNUSUAL SHORTNESS OF BREATH  *UNUSUAL BRUISING OR BLEEDING  TENDERNESS IN MOUTH AND THROAT WITH OR WITHOUT PRESENCE OF ULCERS  *URINARY PROBLEMS  *BOWEL PROBLEMS  UNUSUAL RASH Items with * indicate a potential emergency and should be followed up as soon as possible.  Feel free to call the clinic you have any questions or concerns. The clinic phone number is [phone].  Please show the [place] at check-in to the Emergency Department and triage nurse.

## 2015-10-15 NOTE — Telephone Encounter (Signed)
per pof to sch pt appt-sent [name] email to adv no openings in 2 weeks-adv pt has appt on 5/30-pt aware of sch

## 2015-10-15 NOTE — Telephone Encounter (Signed)
per reply from [name] to sch pt with Jason Summers on 5/22-pt to get updated copy b4 leaving trmt

## 2015-10-15 NOTE — Progress Notes (Signed)
[place] Telephone:([phone]) [phone]   [phone]  OFFICE PROGRESS NOTE  [name], MD [place] [address]  DIAGNOSIS: Stage IIIA (T3, N2, M0) non-small cell lung cancer, poorly differentiated squamous cell carcinoma diagnosed in February 2017 with PDL 1 expression of 35%, presented with large left lower lobe lung mass in addition to left upper lobe lung nodule and mediastinal lymphadenopathy.  PRIOR THERAPY: None.  CURRENT THERAPY: Course of concurrent chemoradiation with weekly carboplatin for AUC of 2 and paclitaxel 45 MG/M2. Status post one cycle.  INTERVAL HISTORY: Jason Summers 82 y.o. male returns to the clinic today for follow-up visit accompanied by his wife. The patient tolerated the first cycle of his treatment fairly well except for fatigue and lack of appetite. He did not have any significant weight loss. He denied having any significant chest pain, shortness of breath, cough or hemoptysis. He denied having any dysphagia or odynophagia. The patient has no nausea or vomiting, no fever or chills. He is here today to start cycle #2 of his treatment. He had brain MRI that showed no evidence of metastatic disease to the brain.  MEDICAL HISTORY: Past Medical History  Diagnosis Date  . Arthritis   . Spondylosis of cervical joint   . Lateral epicondylitis (tennis elbow)   . Hemorrhoids   . Cancer ([place])     basal cell on right temple    ALLERGIES:  has No Known Allergies.  MEDICATIONS:  Current Outpatient Prescriptions  Medication Sig Dispense Refill  . aspirin 81 MG EC tablet Take 81 mg by mouth every other day. Reported on 09/11/2015    . Eyelid Cleansers (AVENOVA) 0.01 % SOLN     . fish oil-omega-3 fatty acids 1000 MG capsule Take 2 g by mouth 2 (two) times daily.      Marland Kitchen glucosamine-chondroitin 500-400 MG tablet Take 1 tablet by mouth 2 (two) times daily. Reported on 09/11/2015    . hyaluronate sodium  (RADIAPLEXRX) GEL Apply 1 application topically 2 (two) times daily.    Marland Kitchen ibuprofen (ADVIL,MOTRIN) 200 MG tablet Take 400 mg by mouth daily as needed for moderate pain. Reported on 09/27/2015    . LORazepam (ATIVAN) 0.5 MG tablet Take 1 tablet (0.5 mg total) by mouth 2 (two) times daily as needed for anxiety. 30 tablet 2  . omega-3 acid ethyl esters (LOVAZA) 1 g capsule Take by mouth.    Marland Kitchen omeprazole (PRILOSEC) 20 MG capsule Take 20 mg by mouth as needed (heartburn).     . prochlorperazine (COMPAZINE) 10 MG tablet Take 1 tablet (10 mg total) by mouth every 6 (six) hours as needed for nausea or vomiting. 30 tablet 0   No current facility-administered medications for this visit.    SURGICAL HISTORY:  Past Surgical History  Procedure Laterality Date  . Colonoscopy  2003  . Knee arthroscopy  2006    right    REVIEW OF SYSTEMS:  A comprehensive review of systems was negative except for: Constitutional: positive for anorexia and fatigue   PHYSICAL EXAMINATION: General appearance: alert, cooperative, fatigued and no distress Head: Normocephalic, without obvious abnormality, atraumatic Neck: no adenopathy, no JVD, supple, symmetrical, trachea midline and thyroid not enlarged, symmetric, no tenderness/mass/nodules Lymph nodes: Cervical, supraclavicular, and axillary nodes normal. Resp: clear to auscultation bilaterally Back: symmetric, no curvature. ROM normal. No CVA tenderness. Cardio: regular rate and rhythm, S1, S2 normal, no murmur, click, rub or gallop GI: soft, non-tender; bowel sounds  normal; no masses,  no organomegaly Extremities: extremities normal, atraumatic, no cyanosis or edema  ECOG PERFORMANCE STATUS: 1 - Symptomatic but completely ambulatory  Blood pressure 112/65, pulse 78, temperature 97.9 F (36.6 C), temperature source Oral, resp. rate 18, height 5' 10" (1.778 m), weight 169 lb 8 oz (76.885 kg), SpO2 99 %.  LABORATORY DATA: Lab Results  Component Value Date   WBC 5.7  10/15/2015   HGB 12.9* 10/15/2015   HCT 39.7 10/15/2015   MCV 85.7 10/15/2015   PLT 252 10/15/2015      Chemistry      Component Value Date/Time   NA 137 10/15/2015 0937   NA 135 08/22/2015 1443   K 4.2 10/15/2015 0937   K 4.2 08/22/2015 1443   CL 103 08/22/2015 1443   CO2 23 10/15/2015 0937   CO2 25 08/22/2015 1443   BUN 17.9 10/15/2015 0937   BUN 19 08/22/2015 1443   CREATININE 0.8 10/15/2015 0937   CREATININE 0.84 08/22/2015 1443   CREATININE 0.83 07/09/2015 1520      Component Value Date/Time   CALCIUM 8.7 10/15/2015 0937   CALCIUM 8.6 08/22/2015 1443   ALKPHOS 78 10/15/2015 0937   ALKPHOS 79 07/09/2015 1520   AST 14 10/15/2015 0937   AST 14 07/09/2015 1520   ALT 16 10/15/2015 0937   ALT 10 07/09/2015 1520   BILITOT 0.30 10/15/2015 0937   BILITOT 0.3 07/09/2015 1520       RADIOGRAPHIC STUDIES: Mr Jeri Cos RZ Contrast  10/18/15  CLINICAL DATA:  Non-small-cell lung cancer. Staging for metastatic disease. EXAM: MRI HEAD WITHOUT AND WITH CONTRAST TECHNIQUE: Multiplanar, multiecho pulse sequences of the brain and surrounding structures were obtained without and with intravenous contrast. CONTRAST:  51m MULTIHANCE GADOBENATE DIMEGLUMINE 529 MG/ML IV SOLN COMPARISON:  None. FINDINGS: Mild atrophy.  Negative for hydrocephalus. Negative for acute or chronic infarction. Negative for intracranial hemorrhage.  No fluid collection. Postcontrast imaging demonstrates normal enhancement. No enhancing metastatic deposit. Leptomeningeal enhancement normal. Calvarium is intact. Pituitary and skull base normal. Left lens replacement. Mild mucosal edema in the paranasal sinuses. IMPRESSION: Negative for intracranial metastatic disease.  No acute abnormality. Electronically Signed   By: CFranchot GalloM.D.   On: 0May 11, 201708:35    ASSESSMENT AND PLAN: This is a very pleasant 82years old white male recently diagnosed with a stage IIIa non-small cell lung cancer, squamous cell carcinoma  and currently undergoing a course of concurrent chemoradiation with weekly carboplatin and paclitaxel status post 1 cycle. He tolerated the first cycle of his treatment well with no significant adverse effect except for lack of appetite but he did not have any significant weight loss. His lab work is unremarkable today except for persistent mild anemia. I recommended for the patient to proceed with cycle #2 today as a scheduled. He would come back for follow-up visit in 2 weeks for evaluation with the start of cycle #4. The patient was advised to call immediately if he has any concerning symptoms in the interval. The patient voices understanding of current disease status and treatment options and is in agreement with the current care plan.  All questions were answered. The patient knows to call the clinic with any problems, questions or concerns. We can certainly see the patient much sooner if necessary.  Disclaimer: This note was dictated with voice recognition software. Similar sounding words can inadvertently be transcribed and may not be corrected upon review.

## 2015-10-15 NOTE — Telephone Encounter (Signed)
Per staff message and POF I have adjusted  appts. Advised scheduler of appts. JMW

## 2015-10-16 ENCOUNTER — Ambulatory Visit
Admission: RE | Admit: 2015-10-16 | Discharge: 2015-10-16 | Disposition: A | Discharge status: Home / Self Care | Payer: Medicare Other | Source: Ambulatory Visit | Attending: Radiation Oncology | Admitting: Radiation Oncology

## 2015-10-16 ENCOUNTER — Encounter: Payer: Self-pay | Admitting: Radiation Oncology

## 2015-10-16 VITALS — BP 147/77 | HR 88 | Temp 97.6°F | Ht 70.0 in | Wt 171.8 lb

## 2015-10-16 DIAGNOSIS — Z510 Encounter for antineoplastic radiation therapy: Secondary | ICD-10-CM | POA: Diagnosis not present

## 2015-10-16 DIAGNOSIS — C3492 Malignant neoplasm of unspecified part of left bronchus or lung: Secondary | ICD-10-CM

## 2015-10-16 NOTE — Progress Notes (Signed)
Weekly Management Note Current Dose: 14 Gy  Projected Dose: 60 Gy   Narrative:  The patient presents for routine under treatment assessment.  CBCT/MVCT images/Port film x-rays were reviewed.  The chart was checked. Cha has completed 7 fractions to his chest. He denies having pain. He reports having an occasional dry cough. He denies having a sore throat or trouble swallowing. He had chemotherapy yesterday. He denies having any skin irritation or fatigue.  Physical Findings:  Unchanged. No skin changes in upper back.   Vitals:  Filed Vitals:   10/16/15 0941  BP: 147/77  Pulse: 88  Temp: 97.6 F (36.4 C)   Weight:  Wt Readings from Last 3 Encounters:  10/16/15 171 lb 12.8 oz (77.928 kg)  10/15/15 169 lb 8 oz (76.885 kg)  10/09/15 169 lb 11.2 oz (76.975 kg)   Lab Results  Component Value Date   WBC 5.7 10/15/2015   HGB 12.9* 10/15/2015   HCT 39.7 10/15/2015   MCV 85.7 10/15/2015   PLT 252 10/15/2015   Lab Results  Component Value Date   CREATININE 0.8 10/15/2015   BUN 17.9 10/15/2015   NA 137 10/15/2015   K 4.2 10/15/2015   CL 103 08/22/2015   CO2 23 10/15/2015     Impression:  The patient is tolerating radiation.  Plan:  Continue treatment as planned.    ------------------------------------------------  Thea Silversmith, MD    This document serves as a record of services personally performed by Thea Silversmith, MD. It was created on her behalf by  Lendon Collar, a trained medical scribe. The creation of this record is based on the scribe's personal observations and the provider's statements to them. This document has been checked and approved by the attending provider.

## 2015-10-16 NOTE — Progress Notes (Signed)
Jason Summers has completed 7 fractions to his chest.  He denies having pain.  He reports having an occasional dry cough.  He denies having a sore throat or trouble swallowing.  He had chemotherapy yesterday.  He denies having any skin irritation or fatigue.  BP 147/77 mmHg  Pulse 88  Temp(Src) 97.6 F (36.4 C) (Oral)  Ht 5' 10" (1.778 m)  Wt 171 lb 12.8 oz (77.928 kg)  BMI 24.65 kg/m2  SpO2 100%   Wt Readings from Last 3 Encounters:  10/16/15 171 lb 12.8 oz (77.928 kg)  10/15/15 169 lb 8 oz (76.885 kg)  10/09/15 169 lb 11.2 oz (76.975 kg)

## 2015-10-17 ENCOUNTER — Ambulatory Visit
Admission: RE | Admit: 2015-10-17 | Discharge: 2015-10-17 | Disposition: A | Discharge status: Home / Self Care | Payer: Medicare Other | Source: Ambulatory Visit | Attending: Radiation Oncology | Admitting: Radiation Oncology

## 2015-10-17 DIAGNOSIS — Z510 Encounter for antineoplastic radiation therapy: Secondary | ICD-10-CM | POA: Diagnosis not present

## 2015-10-18 ENCOUNTER — Ambulatory Visit
Admission: RE | Admit: 2015-10-18 | Discharge: 2015-10-18 | Disposition: A | Discharge status: Home / Self Care | Payer: Medicare Other | Source: Ambulatory Visit | Attending: Radiation Oncology | Admitting: Radiation Oncology

## 2015-10-18 DIAGNOSIS — Z510 Encounter for antineoplastic radiation therapy: Secondary | ICD-10-CM | POA: Diagnosis not present

## 2015-10-19 ENCOUNTER — Ambulatory Visit
Admission: RE | Admit: 2015-10-19 | Discharge: 2015-10-19 | Disposition: A | Discharge status: Home / Self Care | Payer: Medicare Other | Source: Ambulatory Visit | Attending: Radiation Oncology | Admitting: Radiation Oncology

## 2015-10-19 DIAGNOSIS — Z510 Encounter for antineoplastic radiation therapy: Secondary | ICD-10-CM | POA: Diagnosis not present

## 2015-10-22 ENCOUNTER — Other Ambulatory Visit: Payer: Self-pay | Admitting: *Deleted

## 2015-10-22 ENCOUNTER — Ambulatory Visit
Admission: RE | Admit: 2015-10-22 | Discharge: 2015-10-22 | Disposition: A | Discharge status: Home / Self Care | Payer: Medicare Other | Source: Ambulatory Visit | Attending: Radiation Oncology | Admitting: Radiation Oncology

## 2015-10-22 ENCOUNTER — Encounter (HOSPITAL_COMMUNITY): Payer: Self-pay

## 2015-10-22 ENCOUNTER — Ambulatory Visit (HOSPITAL_BASED_OUTPATIENT_CLINIC_OR_DEPARTMENT_OTHER): Payer: Medicare Other

## 2015-10-22 ENCOUNTER — Other Ambulatory Visit (HOSPITAL_BASED_OUTPATIENT_CLINIC_OR_DEPARTMENT_OTHER): Payer: Medicare Other

## 2015-10-22 VITALS — BP 131/76 | HR 70 | Temp 97.8°F | Resp 18

## 2015-10-22 DIAGNOSIS — C3492 Malignant neoplasm of unspecified part of left bronchus or lung: Secondary | ICD-10-CM

## 2015-10-22 DIAGNOSIS — Z5111 Encounter for antineoplastic chemotherapy: Secondary | ICD-10-CM | POA: Diagnosis not present

## 2015-10-22 DIAGNOSIS — Z510 Encounter for antineoplastic radiation therapy: Secondary | ICD-10-CM | POA: Diagnosis not present

## 2015-10-22 DIAGNOSIS — C3432 Malignant neoplasm of lower lobe, left bronchus or lung: Secondary | ICD-10-CM | POA: Diagnosis not present

## 2015-10-22 LAB — COMPREHENSIVE METABOLIC PANEL
ALT: 35 U/L (ref 0–55)
ANION GAP: 7 meq/L (ref 3–11)
AST: 23 U/L (ref 5–34)
Albumin: 3.2 g/dL — ABNORMAL LOW (ref 3.5–5.0)
Alkaline Phosphatase: 74 U/L (ref 40–150)
BUN: 18.3 mg/dL (ref 7.0–26.0)
CALCIUM: 8.6 mg/dL (ref 8.4–10.4)
CHLORIDE: 105 meq/L (ref 98–109)
CO2: 24 mEq/L (ref 22–29)
CREATININE: 0.8 mg/dL (ref 0.7–1.3)
EGFR: 82 mL/min/{1.73_m2} — AB (ref >90)
Glucose: 93 mg/dl (ref 70–140)
Potassium: 4.2 mEq/L (ref 3.5–5.1)
Sodium: 136 mEq/L (ref 136–145)
Total Bilirubin: 0.32 mg/dL (ref 0.20–1.20)
Total Protein: 6.7 g/dL (ref 6.4–8.3)

## 2015-10-22 LAB — CBC WITH DIFFERENTIAL/PLATELET
BASO%: 0.5 % (ref 0.0–2.0)
BASOS ABS: 0 10*3/uL (ref 0.0–0.1)
EOS ABS: 0 10*3/uL (ref 0.0–0.5)
EOS%: 0.9 % (ref 0.0–7.0)
HEMATOCRIT: 37.3 % — AB (ref 38.4–49.9)
HGB: 12.3 g/dL — ABNORMAL LOW (ref 13.0–17.1)
LYMPH#: 0.6 10*3/uL — AB (ref 0.9–3.3)
LYMPH%: 14.3 % (ref 14.0–49.0)
MCH: 28.2 pg (ref 27.2–33.4)
MCHC: 33 g/dL (ref 32.0–36.0)
MCV: 85.6 fL (ref 79.3–98.0)
MONO#: 0.5 10*3/uL (ref 0.1–0.9)
MONO%: 12.2 % (ref 0.0–14.0)
NEUT#: 3.1 10*3/uL (ref 1.5–6.5)
NEUT%: 72.1 % (ref 39.0–75.0)
PLATELETS: 160 10*3/uL (ref 140–400)
RBC: 4.36 10*6/uL (ref 4.20–5.82)
RDW: 13.4 % (ref 11.0–14.6)
WBC: 4.3 10*3/uL (ref 4.0–10.3)

## 2015-10-22 MED ORDER — PALONOSETRON HCL INJECTION 0.25 MG/5ML
0.2500 mg | Freq: Once | INTRAVENOUS | Status: AC
  Administered 2015-10-22: 0.25 mg via INTRAVENOUS

## 2015-10-22 MED ORDER — DIPHENHYDRAMINE HCL 50 MG/ML IJ SOLN
INTRAMUSCULAR | Status: AC
  Filled 2015-10-22: qty 1

## 2015-10-22 MED ORDER — SODIUM CHLORIDE 0.9 % IV SOLN
Freq: Once | INTRAVENOUS | Status: AC
  Administered 2015-10-22: 11:00:00 via INTRAVENOUS

## 2015-10-22 MED ORDER — PALONOSETRON HCL INJECTION 0.25 MG/5ML
INTRAVENOUS | Status: AC
  Filled 2015-10-22: qty 5

## 2015-10-22 MED ORDER — SODIUM CHLORIDE 0.9 % IV SOLN
175.0000 mg | Freq: Once | INTRAVENOUS | Status: AC
  Administered 2015-10-22: 180 mg via INTRAVENOUS
  Filled 2015-10-22: qty 18

## 2015-10-22 MED ORDER — DIPHENHYDRAMINE HCL 50 MG/ML IJ SOLN
50.0000 mg | Freq: Once | INTRAMUSCULAR | Status: AC
  Administered 2015-10-22: 50 mg via INTRAVENOUS

## 2015-10-22 MED ORDER — SODIUM CHLORIDE 0.9 % IV SOLN
20.0000 mg | Freq: Once | INTRAVENOUS | Status: AC
  Administered 2015-10-22: 20 mg via INTRAVENOUS
  Filled 2015-10-22: qty 2

## 2015-10-22 MED ORDER — PACLITAXEL CHEMO INJECTION 300 MG/50ML
45.0000 mg/m2 | Freq: Once | INTRAVENOUS | Status: AC
  Administered 2015-10-22: 90 mg via INTRAVENOUS
  Filled 2015-10-22: qty 15

## 2015-10-22 MED ORDER — FAMOTIDINE IN NACL 20-0.9 MG/50ML-% IV SOLN
INTRAVENOUS | Status: AC
  Filled 2015-10-22: qty 50

## 2015-10-22 MED ORDER — FAMOTIDINE IN NACL 20-0.9 MG/50ML-% IV SOLN
20.0000 mg | Freq: Once | INTRAVENOUS | Status: AC
  Administered 2015-10-22: 20 mg via INTRAVENOUS

## 2015-10-22 NOTE — Patient Instructions (Signed)
[facility] Discharge Instructions for Patients Receiving Chemotherapy  Today you received the following chemotherapy agents :  Taxol,  Carboplatin.  To help prevent nausea and vomiting after your treatment, we encourage you to take your nausea medication as prescribed.   If you develop nausea and vomiting that is not controlled by your nausea medication, call the clinic.   BELOW ARE SYMPTOMS THAT SHOULD BE REPORTED IMMEDIATELY:  *FEVER GREATER THAN 100.5 F  *CHILLS WITH OR WITHOUT FEVER  NAUSEA AND VOMITING THAT IS NOT CONTROLLED WITH YOUR NAUSEA MEDICATION  *UNUSUAL SHORTNESS OF BREATH  *UNUSUAL BRUISING OR BLEEDING  TENDERNESS IN MOUTH AND THROAT WITH OR WITHOUT PRESENCE OF ULCERS  *URINARY PROBLEMS  *BOWEL PROBLEMS  UNUSUAL RASH Items with * indicate a potential emergency and should be followed up as soon as possible.  Feel free to call the clinic you have any questions or concerns. The clinic phone number is [phone].  Please show the CHEMO ALERT CARD at check-in to the Emergency Department and triage nurse.   

## 2015-10-23 ENCOUNTER — Ambulatory Visit
Admission: RE | Admit: 2015-10-23 | Discharge: 2015-10-23 | Disposition: A | Discharge status: Home / Self Care | Payer: Medicare Other | Source: Ambulatory Visit | Attending: Radiation Oncology | Admitting: Radiation Oncology

## 2015-10-23 ENCOUNTER — Encounter: Payer: Self-pay | Admitting: Radiation Oncology

## 2015-10-23 VITALS — BP 133/74 | HR 76 | Temp 97.8°F | Resp 16 | Ht 70.0 in | Wt 167.8 lb

## 2015-10-23 DIAGNOSIS — Z510 Encounter for antineoplastic radiation therapy: Secondary | ICD-10-CM | POA: Diagnosis not present

## 2015-10-23 DIAGNOSIS — C3492 Malignant neoplasm of unspecified part of left bronchus or lung: Secondary | ICD-10-CM

## 2015-10-23 MED ORDER — SUCRALFATE 1 GM/10ML PO SUSP: 1.0000 g | Freq: Three times a day (TID) | ORAL | Status: DC

## 2015-10-23 MED ORDER — RADIAPLEXRX EX GEL
Freq: Once | CUTANEOUS | Status: AC
  Administered 2015-10-23: 10:00:00 via TOPICAL

## 2015-10-23 NOTE — Progress Notes (Signed)
Jason Summers has completed 12 fractions to her chest.  He denies having pain and shortness of breath.  He reports having an occasional dry cough.  He reports feeling like food was getting stuck in his chest last night.  He reports it went down when he drank water.  He also reports his voice is hoarse.  He had chemotherapy yesterday.  He denies having fatigue.  The skin on his chest and back is red with a rash like appearance.  He is using radiaplex and has been provided with a refill.  BP 133/74 mmHg  Pulse 76  Temp(Src) 97.8 F (36.6 C) (Oral)  Resp 16  Ht _0  (1.778 m)  Wt 167 lb 12.8 oz (76.114 kg)  BMI 24.08 kg/m2  SpO2 99%   Wt Readings from Last 3 Encounters:  10/23/15 167 lb 12.8 oz (76.114 kg)  10/16/15 171 lb 12.8 oz (77.928 kg)  10/15/15 169 lb 8 oz (76.885 kg)

## 2015-10-23 NOTE — Progress Notes (Signed)
  Radiation Oncology         (336) [phone] ________________________________  Name: Jason Summers MRN: [id number]  Date: 10/23/2015  DOB: 1931/07/20  Weekly Radiation Therapy Management    ICD-9-CM ICD-10-CM   1. Non-small cell cancer of left lung (HCC) 162.9 C34.92 hyaluronate sodium (RADIAPLEXRX) gel     Current Dose: 24 Gy     Planned Dose:  60 Gy  Narrative . . . . . . . . The patient presents for routine under treatment assessment.                                  Jason Summers has completed 12 fractions to her chest. He denies having pain and shortness of breath. He reports having an occasional dry cough. He reports feeling like food was getting stuck in his chest last night. He reports it went down when he drank water. He also reports his voice is hoarse. He had chemotherapy yesterday. He denies having fatigue. The skin on his chest and back is red with a rash like appearance. He is using radiaplex and has been provided with a refill.                                  Set-up films were reviewed.                                 The chart was checked. Physical Findings. . .  height is 5' 10" (1.778 m) and weight is 167 lb 12.8 oz (76.114 kg). His oral temperature is 97.8 F (36.6 C). His blood pressure is 133/74 and his pulse is 76. His respiration is 16 and oxygen saturation is 99%. . The skin on his chest and back is red with a rash like appearance.  Lungs are clear to auscultation bilaterally. Heart has regular rate and rhythm. No palpable cervical, supraclavicular, or axillary adenopathy. Abdomen soft, non-tender, normal bowel sounds. Impression . . . . . . . The patient is tolerating radiation. Plan . . . . . . . . . . . . Continue treatment as planned. The patient was given a prescription for Carafate suspension in light of his esophageal symptoms  ________________________________   Blair Promise, PhD, MD

## 2015-10-24 ENCOUNTER — Ambulatory Visit
Admission: RE | Admit: 2015-10-24 | Discharge: 2015-10-24 | Disposition: A | Discharge status: Home / Self Care | Payer: Medicare Other | Source: Ambulatory Visit | Attending: Radiation Oncology | Admitting: Radiation Oncology

## 2015-10-24 DIAGNOSIS — Z510 Encounter for antineoplastic radiation therapy: Secondary | ICD-10-CM | POA: Diagnosis not present

## 2015-10-25 ENCOUNTER — Ambulatory Visit
Admission: RE | Admit: 2015-10-25 | Discharge: 2015-10-25 | Disposition: A | Discharge status: Home / Self Care | Payer: Medicare Other | Source: Ambulatory Visit | Attending: Radiation Oncology | Admitting: Radiation Oncology

## 2015-10-25 DIAGNOSIS — Z510 Encounter for antineoplastic radiation therapy: Secondary | ICD-10-CM | POA: Diagnosis not present

## 2015-10-26 ENCOUNTER — Other Ambulatory Visit: Payer: Self-pay | Admitting: Medical Oncology

## 2015-10-26 ENCOUNTER — Ambulatory Visit
Admission: RE | Admit: 2015-10-26 | Discharge: 2015-10-26 | Disposition: A | Discharge status: Home / Self Care | Payer: Medicare Other | Source: Ambulatory Visit | Attending: Radiation Oncology | Admitting: Radiation Oncology

## 2015-10-26 DIAGNOSIS — C3492 Malignant neoplasm of unspecified part of left bronchus or lung: Secondary | ICD-10-CM

## 2015-10-26 DIAGNOSIS — Z510 Encounter for antineoplastic radiation therapy: Secondary | ICD-10-CM | POA: Diagnosis not present

## 2015-10-29 ENCOUNTER — Ambulatory Visit
Admission: RE | Admit: 2015-10-29 | Discharge: 2015-10-29 | Disposition: A | Discharge status: Home / Self Care | Payer: Medicare Other | Source: Ambulatory Visit | Attending: Radiation Oncology | Admitting: Radiation Oncology

## 2015-10-29 ENCOUNTER — Ambulatory Visit (HOSPITAL_BASED_OUTPATIENT_CLINIC_OR_DEPARTMENT_OTHER): Payer: Medicare Other | Admitting: Oncology

## 2015-10-29 ENCOUNTER — Ambulatory Visit (HOSPITAL_BASED_OUTPATIENT_CLINIC_OR_DEPARTMENT_OTHER): Payer: Medicare Other

## 2015-10-29 ENCOUNTER — Encounter: Payer: Self-pay | Admitting: Oncology

## 2015-10-29 ENCOUNTER — Other Ambulatory Visit: Payer: Medicare Other

## 2015-10-29 ENCOUNTER — Other Ambulatory Visit (HOSPITAL_BASED_OUTPATIENT_CLINIC_OR_DEPARTMENT_OTHER): Payer: Medicare Other

## 2015-10-29 VITALS — BP 106/92 | HR 67 | Temp 98.2°F | Resp 18 | Ht 70.0 in | Wt 167.7 lb

## 2015-10-29 DIAGNOSIS — D696 Thrombocytopenia, unspecified: Secondary | ICD-10-CM

## 2015-10-29 DIAGNOSIS — C3432 Malignant neoplasm of lower lobe, left bronchus or lung: Secondary | ICD-10-CM

## 2015-10-29 DIAGNOSIS — C3492 Malignant neoplasm of unspecified part of left bronchus or lung: Secondary | ICD-10-CM

## 2015-10-29 DIAGNOSIS — D649 Anemia, unspecified: Secondary | ICD-10-CM | POA: Diagnosis not present

## 2015-10-29 DIAGNOSIS — R630 Anorexia: Secondary | ICD-10-CM

## 2015-10-29 DIAGNOSIS — R5383 Other fatigue: Secondary | ICD-10-CM

## 2015-10-29 DIAGNOSIS — Z510 Encounter for antineoplastic radiation therapy: Secondary | ICD-10-CM | POA: Diagnosis not present

## 2015-10-29 DIAGNOSIS — Z5111 Encounter for antineoplastic chemotherapy: Secondary | ICD-10-CM

## 2015-10-29 LAB — COMPREHENSIVE METABOLIC PANEL
ALT: 43 U/L (ref 0–55)
ANION GAP: 8 meq/L (ref 3–11)
AST: 27 U/L (ref 5–34)
Albumin: 3.3 g/dL — ABNORMAL LOW (ref 3.5–5.0)
Alkaline Phosphatase: 90 U/L (ref 40–150)
BUN: 19.6 mg/dL (ref 7.0–26.0)
CALCIUM: 8.6 mg/dL (ref 8.4–10.4)
CHLORIDE: 107 meq/L (ref 98–109)
CO2: 22 meq/L (ref 22–29)
Creatinine: 0.8 mg/dL (ref 0.7–1.3)
EGFR: 82 mL/min/{1.73_m2} — ABNORMAL LOW (ref >90)
Glucose: 108 mg/dl (ref 70–140)
POTASSIUM: 4 meq/L (ref 3.5–5.1)
Sodium: 138 mEq/L (ref 136–145)
Total Bilirubin: 0.37 mg/dL (ref 0.20–1.20)
Total Protein: 6.7 g/dL (ref 6.4–8.3)

## 2015-10-29 LAB — CBC WITH DIFFERENTIAL/PLATELET
BASO%: 0.6 % (ref 0.0–2.0)
BASOS ABS: 0 10*3/uL (ref 0.0–0.1)
EOS%: 0.8 % (ref 0.0–7.0)
Eosinophils Absolute: 0 10*3/uL (ref 0.0–0.5)
HEMATOCRIT: 36.3 % — AB (ref 38.4–49.9)
HGB: 12.2 g/dL — ABNORMAL LOW (ref 13.0–17.1)
LYMPH#: 0.3 10*3/uL — AB (ref 0.9–3.3)
LYMPH%: 9.5 % — AB (ref 14.0–49.0)
MCH: 28.7 pg (ref 27.2–33.4)
MCHC: 33.6 g/dL (ref 32.0–36.0)
MCV: 85.4 fL (ref 79.3–98.0)
MONO#: 0.6 10*3/uL (ref 0.1–0.9)
MONO%: 15.6 % — ABNORMAL HIGH (ref 0.0–14.0)
NEUT#: 2.6 10*3/uL (ref 1.5–6.5)
NEUT%: 73.5 % (ref 39.0–75.0)
PLATELETS: 112 10*3/uL — AB (ref 140–400)
RBC: 4.25 10*6/uL (ref 4.20–5.82)
RDW: 14.3 % (ref 11.0–14.6)
WBC: 3.6 10*3/uL — ABNORMAL LOW (ref 4.0–10.3)

## 2015-10-29 MED ORDER — DIPHENHYDRAMINE HCL 50 MG/ML IJ SOLN
50.0000 mg | Freq: Once | INTRAMUSCULAR | Status: AC
  Administered 2015-10-29: 50 mg via INTRAVENOUS

## 2015-10-29 MED ORDER — SODIUM CHLORIDE 0.9 % IV SOLN
175.0000 mg | Freq: Once | INTRAVENOUS | Status: AC
  Administered 2015-10-29: 180 mg via INTRAVENOUS
  Filled 2015-10-29: qty 18

## 2015-10-29 MED ORDER — FAMOTIDINE IN NACL 20-0.9 MG/50ML-% IV SOLN
INTRAVENOUS | Status: AC
  Filled 2015-10-29: qty 50

## 2015-10-29 MED ORDER — PACLITAXEL CHEMO INJECTION 300 MG/50ML
45.0000 mg/m2 | Freq: Once | INTRAVENOUS | Status: AC
  Administered 2015-10-29: 90 mg via INTRAVENOUS
  Filled 2015-10-29: qty 15

## 2015-10-29 MED ORDER — PALONOSETRON HCL INJECTION 0.25 MG/5ML
INTRAVENOUS | Status: AC
  Filled 2015-10-29: qty 5

## 2015-10-29 MED ORDER — DEXAMETHASONE SODIUM PHOSPHATE 100 MG/10ML IJ SOLN
20.0000 mg | Freq: Once | INTRAMUSCULAR | Status: AC
Start: 2015-10-29 — End: 2015-10-29
  Administered 2015-10-29: 20 mg via INTRAVENOUS
  Filled 2015-10-29: qty 2

## 2015-10-29 MED ORDER — SODIUM CHLORIDE 0.9 % IV SOLN
Freq: Once | INTRAVENOUS | Status: AC
  Administered 2015-10-29: 13:00:00 via INTRAVENOUS

## 2015-10-29 MED ORDER — PALONOSETRON HCL INJECTION 0.25 MG/5ML
0.2500 mg | Freq: Once | INTRAVENOUS | Status: AC
  Administered 2015-10-29: 0.25 mg via INTRAVENOUS

## 2015-10-29 MED ORDER — DIPHENHYDRAMINE HCL 50 MG/ML IJ SOLN
INTRAMUSCULAR | Status: AC
  Filled 2015-10-29: qty 1

## 2015-10-29 MED ORDER — FAMOTIDINE IN NACL 20-0.9 MG/50ML-% IV SOLN
20.0000 mg | Freq: Once | INTRAVENOUS | Status: AC
  Administered 2015-10-29: 20 mg via INTRAVENOUS

## 2015-10-29 NOTE — Progress Notes (Signed)
[name]   [phone]  OFFICE PROGRESS NOTE  Jason Cowden, MD [place] [address]  DIAGNOSIS: Stage IIIA (T3, N2, M0) non-small cell lung cancer, poorly differentiated squamous cell carcinoma diagnosed in February 2017 with PDL 1 expression of 35%, presented with large left lower lobe lung mass in addition to left upper lobe lung nodule and mediastinal lymphadenopathy.  PRIOR THERAPY: None.  CURRENT THERAPY: Course of concurrent chemoradiation with weekly carboplatin for AUC of 2 and paclitaxel 45 MG/M2. Status post three cycles.  INTERVAL HISTORY: Jason Summers 82 y.o. male returns to the clinic today for follow-up visit by himself. The patient continues to tolerate his treatment fairly well except for fatigue and lack of appetite. He did not have any significant weight loss. He denied having any significant chest pain, shortness of breath, cough or hemoptysis. He denied having any dysphagia or odynophagia. The patient has no nausea or vomiting, no fever or chills. He is here today to start cycle #4 of his treatment.   MEDICAL HISTORY: Past Medical History  Diagnosis Date  . Arthritis   . Spondylosis of cervical joint   . Lateral epicondylitis (tennis elbow)   . Hemorrhoids   . Cancer ([place])     basal cell on right temple    ALLERGIES:  has No Known Allergies.  MEDICATIONS:  Current Outpatient Prescriptions  Medication Sig Dispense Refill  . aspirin 81 MG EC tablet Take 81 mg by mouth every other day. Reported on 10/16/2015    . Eyelid Cleansers (AVENOVA) 0.01 % SOLN     . fish oil-omega-3 fatty acids 1000 MG capsule Take 2 g by mouth 2 (two) times daily.      Marland Kitchen glucosamine-chondroitin 500-400 MG tablet Take 1 tablet by mouth 2 (two) times daily. Reported on 09/11/2015    . hyaluronate sodium (RADIAPLEXRX) GEL Apply 1 application topically 2 (two) times daily.    Marland Kitchen ibuprofen (ADVIL,MOTRIN) 200  MG tablet Take 400 mg by mouth daily as needed for moderate pain. Reported on 09/27/2015    . LORazepam (ATIVAN) 0.5 MG tablet Take 1 tablet (0.5 mg total) by mouth 2 (two) times daily as needed for anxiety. 30 tablet 2  . omega-3 acid ethyl esters (LOVAZA) 1 g capsule Take by mouth.    Marland Kitchen omeprazole (PRILOSEC) 20 MG capsule Take 20 mg by mouth as needed (heartburn).     . prochlorperazine (COMPAZINE) 10 MG tablet Take 1 tablet (10 mg total) by mouth every 6 (six) hours as needed for nausea or vomiting. 30 tablet 0  . sucralfate (CARAFATE) 1 GM/10ML suspension Take 10 mLs (1 g total) by mouth 4 (four) times daily -  with meals and at bedtime. 420 mL 0   No current facility-administered medications for this visit.    SURGICAL HISTORY:  Past Surgical History  Procedure Laterality Date  . Colonoscopy  2003  . Knee arthroscopy  2006    right    REVIEW OF SYSTEMS:  A comprehensive review of systems was negative except for: Constitutional: positive for anorexia and fatigue   PHYSICAL EXAMINATION: General appearance: alert, cooperative, fatigued and no distress Head: Normocephalic, without obvious abnormality, atraumatic Neck: no adenopathy, no JVD, supple, symmetrical, trachea midline and thyroid not enlarged, symmetric, no tenderness/mass/nodules Lymph nodes: Cervical, supraclavicular, and axillary nodes normal. Resp: clear to auscultation bilaterally Back: symmetric, no curvature. ROM normal. No CVA tenderness. Cardio: regular rate and rhythm, S1, S2  normal, no murmur, click, rub or gallop GI: soft, non-tender; bowel sounds normal; no masses,  no organomegaly Extremities: extremities normal, atraumatic, no cyanosis or edema  ECOG PERFORMANCE STATUS: 1 - Symptomatic but completely ambulatory  Blood pressure 106/92, pulse 67, temperature 98.2 F (36.8 C), temperature source Oral, resp. rate 18, height _0  (1.778 m), weight 167 lb 11.2 oz (76.068 kg), SpO2 99 %.  LABORATORY DATA: Lab  Results  Component Value Date   WBC 3.6* 10/29/2015   HGB 12.2* 10/29/2015   HCT 36.3* 10/29/2015   MCV 85.4 10/29/2015   PLT 112* 10/29/2015      Chemistry      Component Value Date/Time   NA 138 10/29/2015 0952   NA 135 08/22/2015 1443   K 4.0 10/29/2015 0952   K 4.2 08/22/2015 1443   CL 103 08/22/2015 1443   CO2 22 10/29/2015 0952   CO2 25 08/22/2015 1443   BUN 19.6 10/29/2015 0952   BUN 19 08/22/2015 1443   CREATININE 0.8 10/29/2015 0952   CREATININE 0.84 08/22/2015 1443   CREATININE 0.83 07/09/2015 1520      Component Value Date/Time   CALCIUM 8.6 10/29/2015 0952   CALCIUM 8.6 08/22/2015 1443   ALKPHOS 90 10/29/2015 0952   ALKPHOS 79 07/09/2015 1520   AST 27 10/29/2015 0952   AST 14 07/09/2015 1520   ALT 43 10/29/2015 0952   ALT 10 07/09/2015 1520   BILITOT 0.37 10/29/2015 0952   BILITOT 0.3 07/09/2015 1520       RADIOGRAPHIC STUDIES: Mr Jason Summers EP Contrast  10/15/2015  CLINICAL DATA:  Non-small-cell lung cancer. Staging for metastatic disease. EXAM: MRI HEAD WITHOUT AND WITH CONTRAST TECHNIQUE: Multiplanar, multiecho pulse sequences of the brain and surrounding structures were obtained without and with intravenous contrast. CONTRAST:  27m MULTIHANCE GADOBENATE DIMEGLUMINE 529 MG/ML IV SOLN COMPARISON:  None. FINDINGS: Mild atrophy.  Negative for hydrocephalus. Negative for acute or chronic infarction. Negative for intracranial hemorrhage.  No fluid collection. Postcontrast imaging demonstrates normal enhancement. No enhancing metastatic deposit. Leptomeningeal enhancement normal. Calvarium is intact. Pituitary and skull base normal. Left lens replacement. Mild mucosal edema in the paranasal sinuses. IMPRESSION: Negative for intracranial metastatic disease.  No acute abnormality. Electronically Signed   By: CFranchot GalloM.D.   On: 0May 08, [id number]    ASSESSMENT AND PLAN: This is a very pleasant 82year old white male recently diagnosed with a stage IIIa non-small  cell lung cancer, squamous cell carcinoma and currently undergoing a course of concurrent chemoradiation with weekly carboplatin and paclitaxel status post 3 cycles.  He is tolerating his treatment well with no significant adverse effect except for lack of appetite but he did not have any significant weight loss. His lab work is unremarkable today except for persistent mild anemia and mild thrombocytopenia. I recommended for the patient to proceed with cycle #4 today as a scheduled. He would come back for follow-up visit in 1 week for evaluation with the start of cycle #5. The patient was advised to call immediately if he has any concerning symptoms in the interval. The patient voices understanding of current disease status and treatment options and is in agreement with the current care plan.  All questions were answered. The patient knows to call the clinic with any problems, questions or concerns. We can certainly see the patient much sooner if necessary.  Plan reviewed with Dr. MJulien Nordmann    KMikey Bussing DNP, AGPCNP-BC, AOCNP

## 2015-10-29 NOTE — Patient Instructions (Signed)
Carboplatin injection What is this medicine? CARBOPLATIN (KAR boe pla tin) is a chemotherapy drug. It targets fast dividing cells, like cancer cells, and causes these cells to die. This medicine is used to treat ovarian cancer and many other cancers. This medicine may be used for other purposes; ask your health care provider or pharmacist if you have questions. What should I tell my health care provider before I take this medicine? They need to know if you have any of these conditions: -blood disorders -hearing problems -kidney disease -recent or ongoing radiation therapy -an unusual or allergic reaction to carboplatin, cisplatin, other chemotherapy, other medicines, foods, dyes, or preservatives -pregnant or trying to get pregnant -breast-feeding How should I use this medicine? This drug is usually given as an infusion into a vein. It is administered in a hospital or clinic by a specially trained health care professional. Talk to your pediatrician regarding the use of this medicine in children. Special care may be needed. Overdosage: If you think you have taken too much of this medicine contact a poison control center or emergency room at once. NOTE: This medicine is only for you. Do not share this medicine with others. What if I miss a dose? It is important not to miss a dose. Call your doctor or health care professional if you are unable to keep an appointment. What may interact with this medicine? -medicines for seizures -medicines to increase blood counts like filgrastim, pegfilgrastim, sargramostim -some antibiotics like amikacin, gentamicin, neomycin, streptomycin, tobramycin -vaccines Talk to your doctor or health care professional before taking any of these medicines: -acetaminophen -aspirin -ibuprofen -ketoprofen -naproxen This list may not describe all possible interactions. Give your health care provider a list of all the medicines, herbs, non-prescription drugs, or dietary  supplements you use. Also tell them if you smoke, drink alcohol, or use illegal drugs. Some items may interact with your medicine. What should I watch for while using this medicine? Your condition will be monitored carefully while you are receiving this medicine. You will need important blood work done while you are taking this medicine. This drug may make you feel generally unwell. This is not uncommon, as chemotherapy can affect healthy cells as well as cancer cells. Report any side effects. Continue your course of treatment even though you feel ill unless your doctor tells you to stop. In some cases, you may be given additional medicines to help with side effects. Follow all directions for their use. Call your doctor or health care professional for advice if you get a fever, chills or sore throat, or other symptoms of a cold or flu. Do not treat yourself. This drug decreases your body's ability to fight infections. Try to avoid being around people who are sick. This medicine may increase your risk to bruise or bleed. Call your doctor or health care professional if you notice any unusual bleeding. Be careful brushing and flossing your teeth or using a toothpick because you may get an infection or bleed more easily. If you have any dental work done, tell your dentist you are receiving this medicine. Avoid taking products that contain aspirin, acetaminophen, ibuprofen, naproxen, or ketoprofen unless instructed by your doctor. These medicines may hide a fever. Do not become pregnant while taking this medicine. Women should inform their doctor if they wish to become pregnant or think they might be pregnant. There is a potential for serious side effects to an unborn child. Talk to your health care professional or pharmacist for more information.  Do not breast-feed an infant while taking this medicine. What side effects may I notice from receiving this medicine? Side effects that you should report to your  doctor or health care professional as soon as possible: -allergic reactions like skin rash, itching or hives, swelling of the face, lips, or tongue -signs of infection - fever or chills, cough, sore throat, pain or difficulty passing urine -signs of decreased platelets or bleeding - bruising, pinpoint red spots on the skin, black, tarry stools, nosebleeds -signs of decreased red blood cells - unusually weak or tired, fainting spells, lightheadedness -breathing problems -changes in hearing -changes in vision -chest pain -high blood pressure -low blood counts - This drug may decrease the number of white blood cells, red blood cells and platelets. You may be at increased risk for infections and bleeding. -nausea and vomiting -pain, swelling, redness or irritation at the injection site -pain, tingling, numbness in the hands or feet -problems with balance, talking, walking -trouble passing urine or change in the amount of urine Side effects that usually do not require medical attention (report to your doctor or health care professional if they continue or are bothersome): -hair loss -loss of appetite -metallic taste in the mouth or changes in taste This list may not describe all possible side effects. Call your doctor for medical advice about side effects. You may report side effects to FDA at 1-800-FDA-1088. Where should I keep my medicine? This drug is given in a hospital or clinic and will not be stored at home. NOTE: This sheet is a summary. It may not cover all possible information. If you have questions about this medicine, talk to your doctor, pharmacist, or health care provider.    2016, Elsevier/Gold Standard. (2007-08-31 14:38:05) Paclitaxel injection What is this medicine? PACLITAXEL (PAK li TAX el) is a chemotherapy drug. It targets fast dividing cells, like cancer cells, and causes these cells to die. This medicine is used to treat ovarian cancer, breast cancer, and other  cancers. This medicine may be used for other purposes; ask your health care provider or pharmacist if you have questions. What should I tell my health care provider before I take this medicine? They need to know if you have any of these conditions: -blood disorders -irregular heartbeat -infection (especially a virus infection such as chickenpox, cold sores, or herpes) -liver disease -previous or ongoing radiation therapy -an unusual or allergic reaction to paclitaxel, alcohol, polyoxyethylated castor oil, other chemotherapy agents, other medicines, foods, dyes, or preservatives -pregnant or trying to get pregnant -breast-feeding How should I use this medicine? This drug is given as an infusion into a vein. It is administered in a hospital or clinic by a specially trained health care professional. Talk to your pediatrician regarding the use of this medicine in children. Special care may be needed. Overdosage: If you think you have taken too much of this medicine contact a poison control center or emergency room at once. NOTE: This medicine is only for you. Do not share this medicine with others. What if I miss a dose? It is important not to miss your dose. Call your doctor or health care professional if you are unable to keep an appointment. What may interact with this medicine? Do not take this medicine with any of the following medications: -disulfiram -metronidazole This medicine may also interact with the following medications: -cyclosporine -diazepam -ketoconazole -medicines to increase blood counts like filgrastim, pegfilgrastim, sargramostim -other chemotherapy drugs like cisplatin, doxorubicin, epirubicin, etoposide, teniposide, vincristine -quinidine -testosterone -  vaccines -verapamil Talk to your doctor or health care professional before taking any of these medicines: -acetaminophen -aspirin -ibuprofen -ketoprofen -naproxen This list may not describe all possible  interactions. Give your health care provider a list of all the medicines, herbs, non-prescription drugs, or dietary supplements you use. Also tell them if you smoke, drink alcohol, or use illegal drugs. Some items may interact with your medicine. What should I watch for while using this medicine? Your condition will be monitored carefully while you are receiving this medicine. You will need important blood work done while you are taking this medicine. This drug may make you feel generally unwell. This is not uncommon, as chemotherapy can affect healthy cells as well as cancer cells. Report any side effects. Continue your course of treatment even though you feel ill unless your doctor tells you to stop. This medicine can cause serious allergic reactions. To reduce your risk you will need to take other medicine(s) before treatment with this medicine. In some cases, you may be given additional medicines to help with side effects. Follow all directions for their use. Call your doctor or health care professional for advice if you get a fever, chills or sore throat, or other symptoms of a cold or flu. Do not treat yourself. This drug decreases your body's ability to fight infections. Try to avoid being around people who are sick. This medicine may increase your risk to bruise or bleed. Call your doctor or health care professional if you notice any unusual bleeding. Be careful brushing and flossing your teeth or using a toothpick because you may get an infection or bleed more easily. If you have any dental work done, tell your dentist you are receiving this medicine. Avoid taking products that contain aspirin, acetaminophen, ibuprofen, naproxen, or ketoprofen unless instructed by your doctor. These medicines may hide a fever. Do not become pregnant while taking this medicine. Women should inform their doctor if they wish to become pregnant or think they might be pregnant. There is a potential for serious side  effects to an unborn child. Talk to your health care professional or pharmacist for more information. Do not breast-feed an infant while taking this medicine. Men are advised not to father a child while receiving this medicine. This product may contain alcohol. Ask your pharmacist or healthcare provider if this medicine contains alcohol. Be sure to tell all healthcare providers you are taking this medicine. Certain medicines, like metronidazole and disulfiram, can cause an unpleasant reaction when taken with alcohol. The reaction includes flushing, headache, nausea, vomiting, sweating, and increased thirst. The reaction can last from 30 minutes to several hours. What side effects may I notice from receiving this medicine? Side effects that you should report to your doctor or health care professional as soon as possible: -allergic reactions like skin rash, itching or hives, swelling of the face, lips, or tongue -low blood counts - This drug may decrease the number of white blood cells, red blood cells and platelets. You may be at increased risk for infections and bleeding. -signs of infection - fever or chills, cough, sore throat, pain or difficulty passing urine -signs of decreased platelets or bleeding - bruising, pinpoint red spots on the skin, black, tarry stools, nosebleeds -signs of decreased red blood cells - unusually weak or tired, fainting spells, lightheadedness -breathing problems -chest pain -high or low blood pressure -mouth sores -nausea and vomiting -pain, swelling, redness or irritation at the injection site -pain, tingling, numbness in the hands or  feet -slow or irregular heartbeat -swelling of the ankle, feet, hands Side effects that usually do not require medical attention (report to your doctor or health care professional if they continue or are bothersome): -bone pain -complete hair loss including hair on your head, underarms, pubic hair, eyebrows, and eyelashes -changes in  the color of fingernails -diarrhea -loosening of the fingernails -loss of appetite -muscle or joint pain -red flush to skin -sweating This list may not describe all possible side effects. Call your doctor for medical advice about side effects. You may report side effects to FDA at 1-800-FDA-1088. Where should I keep my medicine? This drug is given in a hospital or clinic and will not be stored at home. NOTE: This sheet is a summary. It may not cover all possible information. If you have questions about this medicine, talk to your doctor, pharmacist, or health care provider.    2016, Elsevier/Gold Standard. (2015-01-11 13:02:56)

## 2015-10-30 ENCOUNTER — Ambulatory Visit
Admission: RE | Admit: 2015-10-30 | Discharge: 2015-10-30 | Disposition: A | Discharge status: Home / Self Care | Payer: Medicare Other | Source: Ambulatory Visit | Attending: Radiation Oncology | Admitting: Radiation Oncology

## 2015-10-30 ENCOUNTER — Encounter: Payer: Self-pay | Admitting: Radiation Oncology

## 2015-10-30 VITALS — BP 122/75 | HR 79 | Resp 16 | Wt 168.5 lb

## 2015-10-30 DIAGNOSIS — C3492 Malignant neoplasm of unspecified part of left bronchus or lung: Secondary | ICD-10-CM

## 2015-10-30 DIAGNOSIS — Z510 Encounter for antineoplastic radiation therapy: Secondary | ICD-10-CM | POA: Diagnosis not present

## 2015-10-30 NOTE — Progress Notes (Signed)
  Radiation Oncology         [phone] ________________________________  Name: Jason Summers MRN: [id number]  Date: 10/30/2015  DOB: [birth year]  Weekly Radiation Therapy Management    ICD-9-CM ICD-10-CM   1. Non-small cell cancer of left lung (HCC) 162.9 C34.92      Current Dose: 34 Gy     Planned Dose:  60 Gy  Narrative . . . . . . . . The patient presents for routine under treatment assessment.                                 Denies pain. Reports pressure on left upper chest continues to present when he lays flat. Reports he had chemotherapy on Monday and has had difficulty sleeping ever since. Reports a persistent dry cough. Reports esophagitis is well managed with carafate. Reports hyperpigmentation without desquamation worse on his back. Reports using radiaplex bid as directed. Denies shortness of breath. Reports fatigue.                                    Set-up films were reviewed.                                 The chart was checked. Physical Findings. . .  weight is 168 lb 8 oz (76.431 kg). His blood pressure is 122/75 and his pulse is 79. His respiration is 16 and oxygen saturation is 98%. . The lungs are clear. The heart has a regular rhythm and rate. Patient has minimal skin reaction along the back. Impression . . . . . . . The patient is tolerating radiation. Plan . . . . . . . . . . . . Continue treatment as planned.  ________________________________   Blair Promise, PhD, MD

## 2015-10-30 NOTE — Progress Notes (Signed)
Weight and vitals stable. Denies pain. Reports pressure on left upper chest continues to present when he lays flat. Reports he had chemotherapy on Monday and has had difficulty sleeping ever since. Reports a persistent dry cough. Reports esophagitis is well managed with carafate. Reports hyperpigmentation without desquamation worse on his back. Reports using radiaplex bid as directed. Denies shortness of breath. Reports fatigue.   BP 122/75 mmHg  Pulse 79  Resp 16  Wt 168 lb 8 oz (76.431 kg)  SpO2 98% Wt Readings from Last 3 Encounters:  10/30/15 168 lb 8 oz (76.431 kg)  10/29/15 167 lb 11.2 oz (76.068 kg)  10/23/15 167 lb 12.8 oz (76.114 kg)

## 2015-10-31 ENCOUNTER — Ambulatory Visit
Admission: RE | Admit: 2015-10-31 | Discharge: 2015-10-31 | Disposition: A | Discharge status: Home / Self Care | Payer: Medicare Other | Source: Ambulatory Visit | Attending: Radiation Oncology | Admitting: Radiation Oncology

## 2015-10-31 DIAGNOSIS — Z510 Encounter for antineoplastic radiation therapy: Secondary | ICD-10-CM | POA: Diagnosis not present

## 2015-11-01 ENCOUNTER — Ambulatory Visit
Admission: RE | Admit: 2015-11-01 | Discharge: 2015-11-01 | Disposition: A | Discharge status: Home / Self Care | Payer: Medicare Other | Source: Ambulatory Visit | Attending: Radiation Oncology | Admitting: Radiation Oncology

## 2015-11-01 DIAGNOSIS — Z510 Encounter for antineoplastic radiation therapy: Secondary | ICD-10-CM | POA: Diagnosis not present

## 2015-11-02 ENCOUNTER — Other Ambulatory Visit: Payer: Self-pay | Admitting: Medical Oncology

## 2015-11-02 ENCOUNTER — Ambulatory Visit
Admission: RE | Admit: 2015-11-02 | Discharge: 2015-11-02 | Disposition: A | Discharge status: Home / Self Care | Payer: Medicare Other | Source: Ambulatory Visit | Attending: Radiation Oncology | Admitting: Radiation Oncology

## 2015-11-02 DIAGNOSIS — C3492 Malignant neoplasm of unspecified part of left bronchus or lung: Secondary | ICD-10-CM

## 2015-11-02 DIAGNOSIS — Z510 Encounter for antineoplastic radiation therapy: Secondary | ICD-10-CM | POA: Diagnosis not present

## 2015-11-06 ENCOUNTER — Other Ambulatory Visit (HOSPITAL_BASED_OUTPATIENT_CLINIC_OR_DEPARTMENT_OTHER): Payer: Medicare Other

## 2015-11-06 ENCOUNTER — Ambulatory Visit (HOSPITAL_BASED_OUTPATIENT_CLINIC_OR_DEPARTMENT_OTHER): Payer: Medicare Other

## 2015-11-06 ENCOUNTER — Ambulatory Visit
Admission: RE | Admit: 2015-11-06 | Discharge: 2015-11-06 | Disposition: A | Discharge status: Home / Self Care | Payer: Medicare Other | Source: Ambulatory Visit | Attending: Radiation Oncology | Admitting: Radiation Oncology

## 2015-11-06 ENCOUNTER — Ambulatory Visit (HOSPITAL_BASED_OUTPATIENT_CLINIC_OR_DEPARTMENT_OTHER): Payer: Medicare Other | Admitting: Internal Medicine

## 2015-11-06 ENCOUNTER — Encounter: Payer: Self-pay | Admitting: Internal Medicine

## 2015-11-06 VITALS — BP 116/60 | HR 73 | Temp 97.8°F | Resp 18 | Ht 70.0 in | Wt 169.9 lb

## 2015-11-06 DIAGNOSIS — Z5111 Encounter for antineoplastic chemotherapy: Secondary | ICD-10-CM

## 2015-11-06 DIAGNOSIS — Z510 Encounter for antineoplastic radiation therapy: Secondary | ICD-10-CM | POA: Diagnosis not present

## 2015-11-06 DIAGNOSIS — R1310 Dysphagia, unspecified: Secondary | ICD-10-CM | POA: Diagnosis not present

## 2015-11-06 DIAGNOSIS — R5383 Other fatigue: Secondary | ICD-10-CM

## 2015-11-06 DIAGNOSIS — C3432 Malignant neoplasm of lower lobe, left bronchus or lung: Secondary | ICD-10-CM

## 2015-11-06 DIAGNOSIS — C3492 Malignant neoplasm of unspecified part of left bronchus or lung: Secondary | ICD-10-CM

## 2015-11-06 HISTORY — DX: Dysphagia, unspecified: R13.10

## 2015-11-06 LAB — COMPREHENSIVE METABOLIC PANEL
ALT: 34 U/L (ref 0–55)
ANION GAP: 6 meq/L (ref 3–11)
AST: 20 U/L (ref 5–34)
Albumin: 3.3 g/dL — ABNORMAL LOW (ref 3.5–5.0)
Alkaline Phosphatase: 79 U/L (ref 40–150)
BUN: 15.4 mg/dL (ref 7.0–26.0)
CALCIUM: 8.6 mg/dL (ref 8.4–10.4)
CHLORIDE: 109 meq/L (ref 98–109)
CO2: 23 meq/L (ref 22–29)
CREATININE: 0.8 mg/dL (ref 0.7–1.3)
EGFR: 82 mL/min/{1.73_m2} — AB (ref >90)
Glucose: 138 mg/dl (ref 70–140)
POTASSIUM: 3.8 meq/L (ref 3.5–5.1)
Sodium: 138 mEq/L (ref 136–145)
Total Bilirubin: 0.41 mg/dL (ref 0.20–1.20)
Total Protein: 6.5 g/dL (ref 6.4–8.3)

## 2015-11-06 LAB — CBC WITH DIFFERENTIAL/PLATELET
BASO%: 1.1 % (ref 0.0–2.0)
BASOS ABS: 0 10*3/uL (ref 0.0–0.1)
EOS%: 1.3 % (ref 0.0–7.0)
Eosinophils Absolute: 0 10*3/uL (ref 0.0–0.5)
HEMATOCRIT: 35.2 % — AB (ref 38.4–49.9)
HGB: 11.7 g/dL — ABNORMAL LOW (ref 13.0–17.1)
LYMPH#: 0.4 10*3/uL — AB (ref 0.9–3.3)
LYMPH%: 17.2 % (ref 14.0–49.0)
MCH: 28.1 pg (ref 27.2–33.4)
MCHC: 33.1 g/dL (ref 32.0–36.0)
MCV: 84.9 fL (ref 79.3–98.0)
MONO#: 0.4 10*3/uL (ref 0.1–0.9)
MONO%: 15.4 % — ABNORMAL HIGH (ref 0.0–14.0)
NEUT#: 1.5 10*3/uL (ref 1.5–6.5)
NEUT%: 65 % (ref 39.0–75.0)
PLATELETS: 101 10*3/uL — AB (ref 140–400)
RBC: 4.15 10*6/uL — AB (ref 4.20–5.82)
RDW: 15.1 % — ABNORMAL HIGH (ref 11.0–14.6)
WBC: 2.3 10*3/uL — ABNORMAL LOW (ref 4.0–10.3)

## 2015-11-06 MED ORDER — FAMOTIDINE IN NACL 20-0.9 MG/50ML-% IV SOLN
20.0000 mg | Freq: Once | INTRAVENOUS | Status: AC
Start: 2015-11-06 — End: 2015-11-06
  Administered 2015-11-06: 20 mg via INTRAVENOUS

## 2015-11-06 MED ORDER — DIPHENHYDRAMINE HCL 50 MG/ML IJ SOLN
INTRAMUSCULAR | Status: AC
  Filled 2015-11-06: qty 1

## 2015-11-06 MED ORDER — PALONOSETRON HCL INJECTION 0.25 MG/5ML
INTRAVENOUS | Status: AC
  Filled 2015-11-06: qty 5

## 2015-11-06 MED ORDER — SODIUM CHLORIDE 0.9 % IV SOLN
Freq: Once | INTRAVENOUS | Status: AC
  Administered 2015-11-06: 15:00:00 via INTRAVENOUS

## 2015-11-06 MED ORDER — PALONOSETRON HCL INJECTION 0.25 MG/5ML
0.2500 mg | Freq: Once | INTRAVENOUS | Status: AC
Start: 2015-11-06 — End: 2015-11-06
  Administered 2015-11-06: 0.25 mg via INTRAVENOUS

## 2015-11-06 MED ORDER — FAMOTIDINE IN NACL 20-0.9 MG/50ML-% IV SOLN
INTRAVENOUS | Status: AC
  Filled 2015-11-06: qty 50

## 2015-11-06 MED ORDER — SODIUM CHLORIDE 0.9 % IV SOLN
45.0000 mg/m2 | Freq: Once | INTRAVENOUS | Status: AC
  Administered 2015-11-06: 90 mg via INTRAVENOUS
  Filled 2015-11-06: qty 15

## 2015-11-06 MED ORDER — SODIUM CHLORIDE 0.9 % IV SOLN
175.0000 mg | Freq: Once | INTRAVENOUS | Status: AC
  Administered 2015-11-06: 180 mg via INTRAVENOUS
  Filled 2015-11-06: qty 18

## 2015-11-06 MED ORDER — SODIUM CHLORIDE 0.9 % IV SOLN
20.0000 mg | Freq: Once | INTRAVENOUS | Status: AC
  Administered 2015-11-06: 20 mg via INTRAVENOUS
  Filled 2015-11-06: qty 2

## 2015-11-06 MED ORDER — DIPHENHYDRAMINE HCL 50 MG/ML IJ SOLN
50.0000 mg | Freq: Once | INTRAMUSCULAR | Status: AC
  Administered 2015-11-06: 50 mg via INTRAVENOUS

## 2015-11-06 NOTE — Patient Instructions (Signed)
[facility] Discharge Instructions for Patients Receiving Chemotherapy  Today you received the following chemotherapy agents :  Taxol,  Carboplatin.  To help prevent nausea and vomiting after your treatment, we encourage you to take your nausea medication as prescribed.   If you develop nausea and vomiting that is not controlled by your nausea medication, call the clinic.   BELOW ARE SYMPTOMS THAT SHOULD BE REPORTED IMMEDIATELY:  *FEVER GREATER THAN 100.5 F  *CHILLS WITH OR WITHOUT FEVER  NAUSEA AND VOMITING THAT IS NOT CONTROLLED WITH YOUR NAUSEA MEDICATION  *UNUSUAL SHORTNESS OF BREATH  *UNUSUAL BRUISING OR BLEEDING  TENDERNESS IN MOUTH AND THROAT WITH OR WITHOUT PRESENCE OF ULCERS  *URINARY PROBLEMS  *BOWEL PROBLEMS  UNUSUAL RASH Items with * indicate a potential emergency and should be followed up as soon as possible.  Feel free to call the clinic you have any questions or concerns. The clinic phone number is [phone].  Please show the [facility] at check-in to the Emergency Department and triage nurse.

## 2015-11-06 NOTE — Progress Notes (Signed)
[facility] Telephone:(336) [phone]   Fax:([phone]  OFFICE PROGRESS NOTE  [name], MD [place] [address]  DIAGNOSIS: Stage IIIA (T3, N2, M0) non-small cell lung cancer, poorly differentiated squamous cell carcinoma diagnosed in February 2017 with PDL 1 expression of 35%, presented with large left lower lobe lung mass in addition to left upper lobe lung nodule and mediastinal lymphadenopathy.  PRIOR THERAPY: None.  CURRENT THERAPY: Course of concurrent chemoradiation with weekly carboplatin for AUC of 2 and paclitaxel 45 MG/M2. Status post 4 cycles.  INTERVAL HISTORY: Silverio Glendenning 82 y.o. male returns to the clinic today for follow-up visit accompanied by his wife. The patient tolerated the first 4 cycle of his treatment fairly well except for fatigue and mild odynophagia. He is currently on Carafate as well as Nexium. He did not have any significant weight loss. He denied having any significant chest pain, shortness of breath, cough or hemoptysis. He denied having any dysphagia or odynophagia. The patient has no nausea or vomiting, no fever or chills. He is here today to start cycle #5 of his treatment.   MEDICAL HISTORY: Past Medical History  Diagnosis Date  . Arthritis   . Spondylosis of cervical joint   . Lateral epicondylitis (tennis elbow)   . Hemorrhoids   . Cancer ([place])     basal cell on right temple    ALLERGIES:  has No Known Allergies.  MEDICATIONS:  Current Outpatient Prescriptions  Medication Sig Dispense Refill  . aspirin 81 MG EC tablet Take 81 mg by mouth every other day. Reported on 10/16/2015    . Eyelid Cleansers (AVENOVA) 0.01 % SOLN     . fish oil-omega-3 fatty acids 1000 MG capsule Take 2 g by mouth 2 (two) times daily.      Marland Kitchen glucosamine-chondroitin 500-400 MG tablet Take 1 tablet by mouth 2 (two) times daily. Reported on 09/11/2015    . hyaluronate sodium (RADIAPLEXRX) GEL Apply 1 application  topically 2 (two) times daily.    Marland Kitchen ibuprofen (ADVIL,MOTRIN) 200 MG tablet Take 400 mg by mouth daily as needed for moderate pain. Reported on 09/27/2015    . LORazepam (ATIVAN) 0.5 MG tablet Take 1 tablet (0.5 mg total) by mouth 2 (two) times daily as needed for anxiety. 30 tablet 2  . omega-3 acid ethyl esters (LOVAZA) 1 g capsule Take by mouth.    Marland Kitchen omeprazole (PRILOSEC) 20 MG capsule Take 20 mg by mouth as needed (heartburn).     . prochlorperazine (COMPAZINE) 10 MG tablet Take 1 tablet (10 mg total) by mouth every 6 (six) hours as needed for nausea or vomiting. 30 tablet 0  . sucralfate (CARAFATE) 1 GM/10ML suspension Take 10 mLs (1 g total) by mouth 4 (four) times daily -  with meals and at bedtime. 420 mL 0   No current facility-administered medications for this visit.    SURGICAL HISTORY:  Past Surgical History  Procedure Laterality Date  . Colonoscopy  2003  . Knee arthroscopy  2006    right    REVIEW OF SYSTEMS:  A comprehensive review of systems was negative except for: Constitutional: positive for anorexia and fatigue Gastrointestinal: positive for dyspepsia and odynophagia   PHYSICAL EXAMINATION: General appearance: alert, cooperative, fatigued and no distress Head: Normocephalic, without obvious abnormality, atraumatic Neck: no adenopathy, no JVD, supple, symmetrical, trachea midline and thyroid not enlarged, symmetric, no tenderness/mass/nodules Lymph nodes: Cervical, supraclavicular, and axillary nodes normal. Resp: clear  to auscultation bilaterally Back: symmetric, no curvature. ROM normal. No CVA tenderness. Cardio: regular rate and rhythm, S1, S2 normal, no murmur, click, rub or gallop GI: soft, non-tender; bowel sounds normal; no masses,  no organomegaly Extremities: extremities normal, atraumatic, no cyanosis or edema  ECOG PERFORMANCE STATUS: 1 - Symptomatic but completely ambulatory  Blood pressure 116/60, pulse 73, temperature 97.8 F (36.6 C), temperature  source Oral, resp. rate 18, height _0  (1.778 m), weight 169 lb 14.4 oz (77.066 kg), SpO2 99 %.  LABORATORY DATA: Lab Results  Component Value Date   WBC 2.3* 11/06/2015   HGB 11.7* 11/06/2015   HCT 35.2* 11/06/2015   MCV 84.9 11/06/2015   PLT 101* 11/06/2015      Chemistry      Component Value Date/Time   NA 138 11/06/2015 1308   NA 135 08/22/2015 1443   K 3.8 11/06/2015 1308   K 4.2 08/22/2015 1443   CL 103 08/22/2015 1443   CO2 23 11/06/2015 1308   CO2 25 08/22/2015 1443   BUN 15.4 11/06/2015 1308   BUN 19 08/22/2015 1443   CREATININE 0.8 11/06/2015 1308   CREATININE 0.84 08/22/2015 1443   CREATININE 0.83 07/09/2015 1520      Component Value Date/Time   CALCIUM 8.6 11/06/2015 1308   CALCIUM 8.6 08/22/2015 1443   ALKPHOS 79 11/06/2015 1308   ALKPHOS 79 07/09/2015 1520   AST 20 11/06/2015 1308   AST 14 07/09/2015 1520   ALT 34 11/06/2015 1308   ALT 10 07/09/2015 1520   BILITOT 0.41 11/06/2015 1308   BILITOT 0.3 07/09/2015 1520       RADIOGRAPHIC STUDIES: No results found.  ASSESSMENT AND PLAN: This is a very pleasant 82 years old white male recently diagnosed with a stage IIIa non-small cell lung cancer, squamous cell carcinoma and currently undergoing a course of concurrent chemoradiation with weekly carboplatin and paclitaxel status post 4 cycle. He tolerated the first 4 cycles of his treatment well with no significant adverse effect except for fatigue and odynophagia.  His total white blood count is low but enough for treatment today. I recommended for the patient to proceed with cycle #5 today as a scheduled. He would come back for follow-up visit in 2 weeks for evaluation. For the odynophagia, he will continue on Carafate and Nexium. The patient was advised to call immediately if he has any concerning symptoms in the interval. The patient voices understanding of current disease status and treatment options and is in agreement with the current care  plan.  All questions were answered. The patient knows to call the clinic with any problems, questions or concerns. We can certainly see the patient much sooner if necessary.  Disclaimer: This note was dictated with voice recognition software. Similar sounding words can inadvertently be transcribed and may not be corrected upon review.

## 2015-11-07 ENCOUNTER — Ambulatory Visit
Admission: RE | Admit: 2015-11-07 | Discharge: 2015-11-07 | Disposition: A | Discharge status: Home / Self Care | Payer: Medicare Other | Source: Ambulatory Visit | Attending: Radiation Oncology | Admitting: Radiation Oncology

## 2015-11-07 ENCOUNTER — Encounter: Payer: Self-pay | Admitting: Radiation Oncology

## 2015-11-07 VITALS — BP 116/68 | HR 93 | Temp 98.1°F | Ht 70.0 in | Wt 168.9 lb

## 2015-11-07 DIAGNOSIS — Z510 Encounter for antineoplastic radiation therapy: Secondary | ICD-10-CM | POA: Diagnosis not present

## 2015-11-07 DIAGNOSIS — C3492 Malignant neoplasm of unspecified part of left bronchus or lung: Secondary | ICD-10-CM | POA: Insufficient documentation

## 2015-11-07 MED ORDER — SONAFINE EX EMUL
1.0000 "application " | Freq: Once | CUTANEOUS | Status: AC
  Administered 2015-11-07: 1 via TOPICAL
  Filled 2015-11-07: qty 45

## 2015-11-07 NOTE — Progress Notes (Signed)
Jason Summers is here for his 22nd fraction of radiation to his Chest. He denies pain at this time. He does report some fatigue after treatments and will go home and rest. He is also receiving chemotherapy currently. His chest is red from his clavicles to his sternum and he reported this started yesterday and is burning. His back is also red with some dry peeling noted which began several days ago. He is using the radiaplex cream as directed twice daily to these areas. He reports he is eating well, but does state at times he feels like something gets stuck in is throat. He is using Nexium and Sucralfate twice daily in the morning and evening.   BP 116/68 mmHg  Pulse 93  Temp(Src) 98.1 F (36.7 C)  Ht 5' 10" (1.778 m)  Wt 168 lb 14.4 oz (76.613 kg)  BMI 24.23 kg/m2  SpO2 100%   Wt Readings from Last 3 Encounters:  11/07/15 168 lb 14.4 oz (76.613 kg)  11/06/15 169 lb 14.4 oz (77.066 kg)  10/30/15 168 lb 8 oz (76.431 kg)

## 2015-11-07 NOTE — Progress Notes (Signed)
  Radiation Oncology         (336) [phone] ________________________________  Name: Jason Summers MRN: [id number]  Date: 11/07/2015  DOB: Feb 10, 1932  Weekly Radiation Therapy Management    ICD-9-CM ICD-10-CM   1. Non-small cell cancer of left lung (HCC) 162.9 C34.92 SONAFINE emulsion 1 application     Current Dose: 44 Gy     Planned Dose:  60 Gy  Narrative . . . . . . . . The patient presents for routine under treatment assessment.                                 Mr. Heidenreich is here for his 22nd fraction of radiation to his Chest. He denies pain at this time. The patient saw Dr. Julien Nordmann yesterday and  proceed with cycle #5 of his chemotherapy as scheduled. He does report some fatigue after treatments and will go home and rest. His chest is red from his clavicles to his sternum and he reports this started yesterday and is burning. His back is also red with some dry peeling noted which began several days ago. He is using the radiaplex cream as directed twice daily to these areas. He reports he is eating well, but does state at times he feels like something gets stuck in is throat. He is using Nexium and Sucralfate twice daily in the morning and evening.                                 Set-up films were reviewed.                                 The chart was checked. Physical Findings. . .  height is _0  (1.778 m) and weight is 168 lb 14.4 oz (76.613 kg). His temperature is 98.1 F (36.7 C). His blood pressure is 116/68 and his pulse is 93. His oxygen saturation is 100%. . The lungs are clear. The heart has a regular rhythm and rate. Patient has a fairly significant rash on his chest and back. No skin breakdown Impression . . . . . . . The patient is tolerating radiation. Plan . . . . . . . . . . . . Continue treatment as planned. I will give the patient Sonafine to apply to the treatment fields.  ________________________________   Blair Promise, PhD, MD  This document serves as a record of  services personally performed by Gery Pray, MD. It was created on his behalf by Darcus Austin, a trained medical scribe. The creation of this record is based on the scribe's personal observations and the provider's statements to them. This document has been checked and approved by the attending provider.

## 2015-11-07 NOTE — Addendum Note (Signed)
Encounter addended by: Neysa Hotter, [facility] on: 11/07/2015 10:34 AM<BR>     Documentation filed: Rx Order Verification

## 2015-11-08 ENCOUNTER — Ambulatory Visit
Admission: RE | Admit: 2015-11-08 | Discharge: 2015-11-08 | Disposition: A | Discharge status: Home / Self Care | Payer: Medicare Other | Source: Ambulatory Visit | Attending: Radiation Oncology | Admitting: Radiation Oncology

## 2015-11-08 DIAGNOSIS — Z510 Encounter for antineoplastic radiation therapy: Secondary | ICD-10-CM | POA: Diagnosis not present

## 2015-11-09 ENCOUNTER — Ambulatory Visit
Admission: RE | Admit: 2015-11-09 | Discharge: 2015-11-09 | Disposition: A | Discharge status: Home / Self Care | Payer: Medicare Other | Source: Ambulatory Visit | Attending: Radiation Oncology | Admitting: Radiation Oncology

## 2015-11-09 ENCOUNTER — Other Ambulatory Visit: Payer: Self-pay | Admitting: Medical Oncology

## 2015-11-09 DIAGNOSIS — Z510 Encounter for antineoplastic radiation therapy: Secondary | ICD-10-CM | POA: Diagnosis not present

## 2015-11-09 DIAGNOSIS — C3492 Malignant neoplasm of unspecified part of left bronchus or lung: Secondary | ICD-10-CM

## 2015-11-12 ENCOUNTER — Other Ambulatory Visit (HOSPITAL_BASED_OUTPATIENT_CLINIC_OR_DEPARTMENT_OTHER): Payer: Medicare Other

## 2015-11-12 ENCOUNTER — Ambulatory Visit
Admission: RE | Admit: 2015-11-12 | Discharge: 2015-11-12 | Disposition: A | Discharge status: Home / Self Care | Payer: Medicare Other | Source: Ambulatory Visit | Attending: Radiation Oncology | Admitting: Radiation Oncology

## 2015-11-12 ENCOUNTER — Telehealth: Payer: Self-pay | Admitting: Medical Oncology

## 2015-11-12 ENCOUNTER — Ambulatory Visit (HOSPITAL_BASED_OUTPATIENT_CLINIC_OR_DEPARTMENT_OTHER): Payer: Medicare Other

## 2015-11-12 VITALS — BP 132/78

## 2015-11-12 DIAGNOSIS — C3432 Malignant neoplasm of lower lobe, left bronchus or lung: Secondary | ICD-10-CM

## 2015-11-12 DIAGNOSIS — C3492 Malignant neoplasm of unspecified part of left bronchus or lung: Secondary | ICD-10-CM

## 2015-11-12 DIAGNOSIS — Z510 Encounter for antineoplastic radiation therapy: Secondary | ICD-10-CM | POA: Diagnosis not present

## 2015-11-12 DIAGNOSIS — Z5111 Encounter for antineoplastic chemotherapy: Secondary | ICD-10-CM

## 2015-11-12 LAB — CBC WITH DIFFERENTIAL/PLATELET
BASO%: 1 % (ref 0.0–2.0)
BASOS ABS: 0 10*3/uL (ref 0.0–0.1)
EOS ABS: 0 10*3/uL (ref 0.0–0.5)
EOS%: 1 % (ref 0.0–7.0)
HEMATOCRIT: 35.2 % — AB (ref 38.4–49.9)
HGB: 11.9 g/dL — ABNORMAL LOW (ref 13.0–17.1)
LYMPH%: 13.1 % — AB (ref 14.0–49.0)
MCH: 28.5 pg (ref 27.2–33.4)
MCHC: 33.7 g/dL (ref 32.0–36.0)
MCV: 84.7 fL (ref 79.3–98.0)
MONO#: 0.3 10*3/uL (ref 0.1–0.9)
MONO%: 13.8 % (ref 0.0–14.0)
NEUT#: 1.8 10*3/uL (ref 1.5–6.5)
NEUT%: 71.1 % (ref 39.0–75.0)
PLATELETS: 91 10*3/uL — AB (ref 140–400)
RBC: 4.16 10*6/uL — AB (ref 4.20–5.82)
RDW: 17 % — ABNORMAL HIGH (ref 11.0–14.6)
WBC: 2.5 10*3/uL — ABNORMAL LOW (ref 4.0–10.3)
lymph#: 0.3 10*3/uL — ABNORMAL LOW (ref 0.9–3.3)

## 2015-11-12 LAB — COMPREHENSIVE METABOLIC PANEL
ALT: 36 U/L (ref 0–55)
ANION GAP: 6 meq/L (ref 3–11)
AST: 21 U/L (ref 5–34)
Albumin: 3.4 g/dL — ABNORMAL LOW (ref 3.5–5.0)
Alkaline Phosphatase: 76 U/L (ref 40–150)
BUN: 18.1 mg/dL (ref 7.0–26.0)
CALCIUM: 8.8 mg/dL (ref 8.4–10.4)
CHLORIDE: 107 meq/L (ref 98–109)
CO2: 23 meq/L (ref 22–29)
CREATININE: 0.8 mg/dL (ref 0.7–1.3)
EGFR: 82 mL/min/{1.73_m2} — AB (ref >90)
Glucose: 100 mg/dl (ref 70–140)
POTASSIUM: 4.2 meq/L (ref 3.5–5.1)
Sodium: 137 mEq/L (ref 136–145)
Total Bilirubin: 0.69 mg/dL (ref 0.20–1.20)
Total Protein: 6.8 g/dL (ref 6.4–8.3)

## 2015-11-12 MED ORDER — DIPHENHYDRAMINE HCL 50 MG/ML IJ SOLN
50.0000 mg | Freq: Once | INTRAMUSCULAR | Status: AC
  Administered 2015-11-12: 50 mg via INTRAVENOUS

## 2015-11-12 MED ORDER — FAMOTIDINE IN NACL 20-0.9 MG/50ML-% IV SOLN
INTRAVENOUS | Status: AC
  Filled 2015-11-12: qty 50

## 2015-11-12 MED ORDER — CARBOPLATIN CHEMO INJECTION 450 MG/45ML
175.0000 mg | Freq: Once | INTRAVENOUS | Status: AC
  Administered 2015-11-12: 180 mg via INTRAVENOUS
  Filled 2015-11-12: qty 18

## 2015-11-12 MED ORDER — FAMOTIDINE IN NACL 20-0.9 MG/50ML-% IV SOLN
20.0000 mg | Freq: Once | INTRAVENOUS | Status: AC
  Administered 2015-11-12: 20 mg via INTRAVENOUS

## 2015-11-12 MED ORDER — SODIUM CHLORIDE 0.9 % IV SOLN
45.0000 mg/m2 | Freq: Once | INTRAVENOUS | Status: AC
  Administered 2015-11-12: 90 mg via INTRAVENOUS
  Filled 2015-11-12: qty 15

## 2015-11-12 MED ORDER — PALONOSETRON HCL INJECTION 0.25 MG/5ML
0.2500 mg | Freq: Once | INTRAVENOUS | Status: AC
  Administered 2015-11-12: 0.25 mg via INTRAVENOUS

## 2015-11-12 MED ORDER — DIPHENHYDRAMINE HCL 50 MG/ML IJ SOLN
INTRAMUSCULAR | Status: AC
  Filled 2015-11-12: qty 1

## 2015-11-12 MED ORDER — SODIUM CHLORIDE 0.9 % IV SOLN
20.0000 mg | Freq: Once | INTRAVENOUS | Status: AC
  Administered 2015-11-12: 20 mg via INTRAVENOUS
  Filled 2015-11-12: qty 2

## 2015-11-12 MED ORDER — SODIUM CHLORIDE 0.9 % IV SOLN
Freq: Once | INTRAVENOUS | Status: AC
  Administered 2015-11-12: 12:00:00 via INTRAVENOUS

## 2015-11-12 MED ORDER — PALONOSETRON HCL INJECTION 0.25 MG/5ML
INTRAVENOUS | Status: AC
  Filled 2015-11-12: qty 5

## 2015-11-12 NOTE — Patient Instructions (Signed)
[facility] Discharge Instructions for Patients Receiving Chemotherapy  Today you received the following chemotherapy agents Taxol/Carboplatin  To help prevent nausea and vomiting after your treatment, we encourage you to take your nausea medication    If you develop nausea and vomiting that is not controlled by your nausea medication, call the clinic.   BELOW ARE SYMPTOMS THAT SHOULD BE REPORTED IMMEDIATELY:  *FEVER GREATER THAN 100.5 F  *CHILLS WITH OR WITHOUT FEVER  NAUSEA AND VOMITING THAT IS NOT CONTROLLED WITH YOUR NAUSEA MEDICATION  *UNUSUAL SHORTNESS OF BREATH  *UNUSUAL BRUISING OR BLEEDING  TENDERNESS IN MOUTH AND THROAT WITH OR WITHOUT PRESENCE OF ULCERS  *URINARY PROBLEMS  *BOWEL PROBLEMS  UNUSUAL RASH Items with * indicate a potential emergency and should be followed up as soon as possible.  Feel free to call the clinic you have any questions or concerns. The clinic phone number is [phone].  Please show the [facility] at check-in to the Emergency Department and triage nurse.

## 2015-11-12 NOTE — Progress Notes (Signed)
Okay to treat despite Platelets = 91 today, per Dr. Julien Nordmann

## 2015-11-12 NOTE — Telephone Encounter (Signed)
I reviewed labs today with wife .

## 2015-11-13 ENCOUNTER — Encounter: Payer: Self-pay | Admitting: Radiation Oncology

## 2015-11-13 ENCOUNTER — Ambulatory Visit
Admission: RE | Admit: 2015-11-13 | Discharge: 2015-11-13 | Disposition: A | Discharge status: Home / Self Care | Payer: Medicare Other | Source: Ambulatory Visit | Attending: Radiation Oncology | Admitting: Radiation Oncology

## 2015-11-13 VITALS — BP 119/68 | HR 75 | Resp 16 | Wt 170.0 lb

## 2015-11-13 DIAGNOSIS — Z510 Encounter for antineoplastic radiation therapy: Secondary | ICD-10-CM | POA: Diagnosis not present

## 2015-11-13 DIAGNOSIS — C3492 Malignant neoplasm of unspecified part of left bronchus or lung: Secondary | ICD-10-CM

## 2015-11-13 MED ORDER — SONAFINE EX EMUL
1.0000 "application " | Freq: Once | CUTANEOUS | Status: AC
  Administered 2015-11-13: 1 via TOPICAL

## 2015-11-13 NOTE — Progress Notes (Signed)
Weight and vitals stable. Denies pain in his chest. Reports new onset of a dry cough x 3 days. Denies shortness of breath. Reports discomfort with swallowing foods relieved with carafate. Hyperpigmentation of chest without desquamation noted. Scaly hyperpigmentation of upper back improved from last week with Sonafine. Reports an episode of dizziness yesterday following chemotherapy that has resolved. Reports increased fatigue.   BP 119/68 mmHg  Pulse 75  Resp 16  Wt 170 lb (77.111 kg)  SpO2 97% Wt Readings from Last 3 Encounters:  11/13/15 170 lb (77.111 kg)  11/07/15 168 lb 14.4 oz (76.613 kg)  11/06/15 169 lb 14.4 oz (77.066 kg)

## 2015-11-13 NOTE — Progress Notes (Signed)
  Radiation Oncology         (336) [phone] ________________________________  Name: Jason Summers MRN: [id number]  Date: 11/13/2015  DOB: 02/23/1932  Weekly Radiation Therapy Management    ICD-9-CM ICD-10-CM   1. Non-small cell cancer of left lung (HCC) 162.9 C34.92 SONAFINE emulsion 1 application    Current Dose: 52 Gy     Planned Dose:  60 Gy  Narrative . . . . . . . . The patient presents for routine under treatment assessment.                                 Weight and vitals stable. Denies pain in his chest. Reports new onset of a dry cough x 3 days. Denies shortness of breath. Reports discomfort with swallowing foods relieved with carafate. Hyperpigmentation of chest without desquamation noted. Scaly hyperpigmentation of upper back improved from last week with Sonafine. Reports an episode of dizziness yesterday following chemotherapy that has resolved. Reports increased fatigue.                                  Set-up films were reviewed.                                 The chart was checked. Physical Findings. . .  weight is 170 lb (77.111 kg). His blood pressure is 119/68 and his pulse is 75. His respiration is 16 and oxygen saturation is 97%. . The lungs are clear. The heart has a regular rhythm and rate. Patient has a fairly significant rash on his chest and back. No skin breakdown. Better compared to last week's exam. Impression . . . . . . . The patient is tolerating radiation. Plan . . . . . . . . . . . . Continue treatment as planned. I will give the patient more Sonafine to apply to the treatment fields. He finishes treatment Monday. He will follow up with me in one month.   ________________________________   Blair Promise, PhD, MD    This document serves as a record of services personally performed by Gery Pray, MD. It was created on his behalf by Lendon Collar, a trained medical scribe. The creation of this record is based on the scribe's personal observations and the  provider's statements to them. This document has been checked and approved by the attending provider.

## 2015-11-14 ENCOUNTER — Ambulatory Visit
Admission: RE | Admit: 2015-11-14 | Discharge: 2015-11-14 | Disposition: A | Discharge status: Home / Self Care | Payer: Medicare Other | Source: Ambulatory Visit | Attending: Radiation Oncology | Admitting: Radiation Oncology

## 2015-11-14 DIAGNOSIS — Z510 Encounter for antineoplastic radiation therapy: Secondary | ICD-10-CM | POA: Diagnosis not present

## 2015-11-15 ENCOUNTER — Ambulatory Visit
Admission: RE | Admit: 2015-11-15 | Discharge: 2015-11-15 | Disposition: A | Discharge status: Home / Self Care | Payer: Medicare Other | Source: Ambulatory Visit | Attending: Radiation Oncology | Admitting: Radiation Oncology

## 2015-11-15 ENCOUNTER — Other Ambulatory Visit: Payer: Self-pay | Admitting: *Deleted

## 2015-11-15 DIAGNOSIS — C3492 Malignant neoplasm of unspecified part of left bronchus or lung: Secondary | ICD-10-CM

## 2015-11-15 DIAGNOSIS — Z510 Encounter for antineoplastic radiation therapy: Secondary | ICD-10-CM | POA: Diagnosis not present

## 2015-11-16 ENCOUNTER — Ambulatory Visit
Admission: RE | Admit: 2015-11-16 | Discharge: 2015-11-16 | Disposition: A | Discharge status: Home / Self Care | Payer: Medicare Other | Source: Ambulatory Visit | Attending: Radiation Oncology | Admitting: Radiation Oncology

## 2015-11-16 DIAGNOSIS — Z510 Encounter for antineoplastic radiation therapy: Secondary | ICD-10-CM | POA: Diagnosis not present

## 2015-11-19 ENCOUNTER — Ambulatory Visit (HOSPITAL_BASED_OUTPATIENT_CLINIC_OR_DEPARTMENT_OTHER): Payer: Medicare Other | Admitting: Oncology

## 2015-11-19 ENCOUNTER — Ambulatory Visit (HOSPITAL_BASED_OUTPATIENT_CLINIC_OR_DEPARTMENT_OTHER): Payer: Medicare Other

## 2015-11-19 ENCOUNTER — Other Ambulatory Visit (HOSPITAL_BASED_OUTPATIENT_CLINIC_OR_DEPARTMENT_OTHER): Payer: Medicare Other

## 2015-11-19 ENCOUNTER — Encounter: Payer: Self-pay | Admitting: Oncology

## 2015-11-19 ENCOUNTER — Ambulatory Visit: Payer: Medicare Other

## 2015-11-19 ENCOUNTER — Ambulatory Visit
Admission: RE | Admit: 2015-11-19 | Discharge: 2015-11-19 | Disposition: A | Discharge status: Home / Self Care | Payer: Medicare Other | Source: Ambulatory Visit | Attending: Radiation Oncology | Admitting: Radiation Oncology

## 2015-11-19 ENCOUNTER — Encounter: Payer: Self-pay | Admitting: Radiation Oncology

## 2015-11-19 VITALS — BP 115/64 | HR 79 | Temp 97.9°F | Resp 18 | Ht 70.0 in | Wt 171.2 lb

## 2015-11-19 DIAGNOSIS — C3432 Malignant neoplasm of lower lobe, left bronchus or lung: Secondary | ICD-10-CM

## 2015-11-19 DIAGNOSIS — C3492 Malignant neoplasm of unspecified part of left bronchus or lung: Secondary | ICD-10-CM

## 2015-11-19 DIAGNOSIS — R1310 Dysphagia, unspecified: Secondary | ICD-10-CM

## 2015-11-19 DIAGNOSIS — Z5111 Encounter for antineoplastic chemotherapy: Secondary | ICD-10-CM | POA: Diagnosis not present

## 2015-11-19 DIAGNOSIS — Z510 Encounter for antineoplastic radiation therapy: Secondary | ICD-10-CM | POA: Diagnosis not present

## 2015-11-19 LAB — COMPREHENSIVE METABOLIC PANEL
ALBUMIN: 3.3 g/dL — AB (ref 3.5–5.0)
ALK PHOS: 80 U/L (ref 40–150)
ALT: 28 U/L (ref 0–55)
AST: 21 U/L (ref 5–34)
Anion Gap: 7 mEq/L (ref 3–11)
BILIRUBIN TOTAL: 0.62 mg/dL (ref 0.20–1.20)
BUN: 15.9 mg/dL (ref 7.0–26.0)
CO2: 21 meq/L — AB (ref 22–29)
CREATININE: 0.8 mg/dL (ref 0.7–1.3)
Calcium: 8.5 mg/dL (ref 8.4–10.4)
Chloride: 107 mEq/L (ref 98–109)
EGFR: 84 mL/min/{1.73_m2} — ABNORMAL LOW (ref >90)
GLUCOSE: 130 mg/dL (ref 70–140)
Potassium: 3.9 mEq/L (ref 3.5–5.1)
SODIUM: 136 meq/L (ref 136–145)
TOTAL PROTEIN: 6.6 g/dL (ref 6.4–8.3)

## 2015-11-19 LAB — CBC WITH DIFFERENTIAL/PLATELET
BASO%: 0.9 % (ref 0.0–2.0)
Basophils Absolute: 0 10*3/uL (ref 0.0–0.1)
EOS ABS: 0 10*3/uL (ref 0.0–0.5)
EOS%: 0.6 % (ref 0.0–7.0)
HCT: 33.4 % — ABNORMAL LOW (ref 38.4–49.9)
HEMOGLOBIN: 11.3 g/dL — AB (ref 13.0–17.1)
LYMPH%: 9.9 % — AB (ref 14.0–49.0)
MCH: 28.7 pg (ref 27.2–33.4)
MCHC: 33.7 g/dL (ref 32.0–36.0)
MCV: 85.1 fL (ref 79.3–98.0)
MONO#: 0.4 10*3/uL (ref 0.1–0.9)
MONO%: 17.7 % — AB (ref 0.0–14.0)
NEUT%: 70.9 % (ref 39.0–75.0)
NEUTROS ABS: 1.7 10*3/uL (ref 1.5–6.5)
Platelets: 110 10*3/uL — ABNORMAL LOW (ref 140–400)
RBC: 3.92 10*6/uL — AB (ref 4.20–5.82)
RDW: 18.6 % — AB (ref 11.0–14.6)
WBC: 2.4 10*3/uL — AB (ref 4.0–10.3)
lymph#: 0.2 10*3/uL — ABNORMAL LOW (ref 0.9–3.3)

## 2015-11-19 MED ORDER — SODIUM CHLORIDE 0.9 % IV SOLN
175.0000 mg | Freq: Once | INTRAVENOUS | Status: AC
  Administered 2015-11-19: 180 mg via INTRAVENOUS
  Filled 2015-11-19: qty 18

## 2015-11-19 MED ORDER — PALONOSETRON HCL INJECTION 0.25 MG/5ML
INTRAVENOUS | Status: AC
  Filled 2015-11-19: qty 5

## 2015-11-19 MED ORDER — SODIUM CHLORIDE 0.9 % IV SOLN
20.0000 mg | Freq: Once | INTRAVENOUS | Status: AC
  Administered 2015-11-19: 20 mg via INTRAVENOUS
  Filled 2015-11-19: qty 2

## 2015-11-19 MED ORDER — FAMOTIDINE IN NACL 20-0.9 MG/50ML-% IV SOLN
20.0000 mg | Freq: Once | INTRAVENOUS | Status: AC
  Administered 2015-11-19: 20 mg via INTRAVENOUS

## 2015-11-19 MED ORDER — SODIUM CHLORIDE 0.9 % IV SOLN
Freq: Once | INTRAVENOUS | Status: AC
  Administered 2015-11-19: 13:00:00 via INTRAVENOUS

## 2015-11-19 MED ORDER — DIPHENHYDRAMINE HCL 50 MG/ML IJ SOLN
50.0000 mg | Freq: Once | INTRAMUSCULAR | Status: AC
  Administered 2015-11-19: 50 mg via INTRAVENOUS

## 2015-11-19 MED ORDER — SODIUM CHLORIDE 0.9 % IV SOLN
45.0000 mg/m2 | Freq: Once | INTRAVENOUS | Status: AC
  Administered 2015-11-19: 90 mg via INTRAVENOUS
  Filled 2015-11-19: qty 15

## 2015-11-19 MED ORDER — PALONOSETRON HCL INJECTION 0.25 MG/5ML
0.2500 mg | Freq: Once | INTRAVENOUS | Status: AC
  Administered 2015-11-19: 0.25 mg via INTRAVENOUS

## 2015-11-19 MED ORDER — FAMOTIDINE IN NACL 20-0.9 MG/50ML-% IV SOLN
INTRAVENOUS | Status: AC
  Filled 2015-11-19: qty 50

## 2015-11-19 MED ORDER — DIPHENHYDRAMINE HCL 50 MG/ML IJ SOLN
INTRAMUSCULAR | Status: AC
  Filled 2015-11-19: qty 1

## 2015-11-19 NOTE — Progress Notes (Signed)
[facility] [phone]   [phone]  OFFICE PROGRESS NOTE  [name], MD [place] [address]  DIAGNOSIS: Stage IIIA (T3, N2, M0) non-small cell lung cancer, poorly differentiated squamous cell carcinoma diagnosed in February 2017 with PDL 1 expression of 35%, presented with large left lower lobe lung mass in addition to left upper lobe lung nodule and mediastinal lymphadenopathy.  PRIOR THERAPY: None.  CURRENT THERAPY: Course of concurrent chemoradiation with weekly carboplatin for AUC of 2 and paclitaxel 45 MG/M2. Status post 6 cycles.  INTERVAL HISTORY: Jason Summers 82 y.o. male returns to the clinic today for follow-up visit accompanied by his wife. The patient tolerated the first 6 cycles of his treatment fairly well except for fatigue and mild odynophagia. He is currently on Carafate as well as Nexium. He did not have any significant weight loss. He denied having any significant chest pain, shortness of breath, cough or hemoptysis. He denied having any dysphagia or odynophagia. The patient has no nausea or vomiting, no fever or chills. He is here today to start cycle #7 of his treatment. He finished his radiation this morning.  MEDICAL HISTORY: Past Medical History  Diagnosis Date  . Arthritis   . Spondylosis of cervical joint   . Lateral epicondylitis (tennis elbow)   . Hemorrhoids   . Cancer ([place])     basal cell on right temple  . Odynophagia 11/06/2015    ALLERGIES:  has No Known Allergies.  MEDICATIONS:  Current Outpatient Prescriptions  Medication Sig Dispense Refill  . aspirin 81 MG EC tablet Take 81 mg by mouth every other day. Reported on 10/16/2015    . Eyelid Cleansers (AVENOVA) 0.01 % SOLN     . fish oil-omega-3 fatty acids 1000 MG capsule Take 2 g by mouth 2 (two) times daily.      Marland Kitchen glucosamine-chondroitin 500-400 MG tablet Take 1 tablet by mouth 2 (two) times daily. Reported on  09/11/2015    . ibuprofen (ADVIL,MOTRIN) 200 MG tablet Take 400 mg by mouth daily as needed for moderate pain. Reported on 09/27/2015    . LORazepam (ATIVAN) 0.5 MG tablet Take 1 tablet (0.5 mg total) by mouth 2 (two) times daily as needed for anxiety. 30 tablet 2  . omega-3 acid ethyl esters (LOVAZA) 1 g capsule Take by mouth.    Marland Kitchen omeprazole (PRILOSEC) 20 MG capsule Take 20 mg by mouth as needed (heartburn).     . prochlorperazine (COMPAZINE) 10 MG tablet Take 1 tablet (10 mg total) by mouth every 6 (six) hours as needed for nausea or vomiting. 30 tablet 0  . sucralfate (CARAFATE) 1 GM/10ML suspension Take 10 mLs (1 g total) by mouth 4 (four) times daily -  with meals and at bedtime. 420 mL 0  . Wound Dressings (SONAFINE EX) Apply topically.     No current facility-administered medications for this visit.   Facility-Administered Medications Ordered in Other Visits  Medication Dose Route Frequency Provider Last Rate Last Dose  . 0.9 %  sodium chloride infusion   Intravenous Once Curt Bears, MD      . CARBOplatin (PARAPLATIN) 180 mg in sodium chloride 0.9 % 100 mL chemo infusion  180 mg Intravenous Once Curt Bears, MD      . dexamethasone (DECADRON) 20 mg in sodium chloride 0.9 % 50 mL IVPB  20 mg Intravenous Once Curt Bears, MD      . diphenhydrAMINE (BENADRYL) injection 50 mg  50 mg Intravenous Once Curt Bears, MD      . famotidine (PEPCID) IVPB 20 mg premix  20 mg Intravenous Once Curt Bears, MD      . PACLitaxel (TAXOL) 90 mg in sodium chloride 0.9 % 250 mL chemo infusion (</= 69m/m2)  45 mg/m2 (Treatment Plan Actual) Intravenous Once MCurt Bears MD        SURGICAL HISTORY:  Past Surgical History  Procedure Laterality Date  . Colonoscopy  2003  . Knee arthroscopy  2006    right    REVIEW OF SYSTEMS:  A comprehensive review of systems was negative except for: Constitutional: positive for anorexia and fatigue Gastrointestinal: positive for dyspepsia and  odynophagia   PHYSICAL EXAMINATION: General appearance: alert, cooperative, fatigued and no distress Head: Normocephalic, without obvious abnormality, atraumatic Neck: no adenopathy, no JVD, supple, symmetrical, trachea midline and thyroid not enlarged, symmetric, no tenderness/mass/nodules Lymph nodes: Cervical, supraclavicular, and axillary nodes normal. Resp: clear to auscultation bilaterally Back: symmetric, no curvature. ROM normal. No CVA tenderness. Cardio: regular rate and rhythm, S1, S2 normal, no murmur, click, rub or gallop GI: soft, non-tender; bowel sounds normal; no masses,  no organomegaly Extremities: extremities normal, atraumatic, no cyanosis or edema  ECOG PERFORMANCE STATUS: 1 - Symptomatic but completely ambulatory  Blood pressure 115/64, pulse 79, temperature 97.9 F (36.6 C), temperature source Oral, resp. rate 18, height _0  (1.778 m), weight 171 lb 3.2 oz (77.656 kg), SpO2 99 %.  LABORATORY DATA: Lab Results  Component Value Date   WBC 2.4* 11/19/2015   HGB 11.3* 11/19/2015   HCT 33.4* 11/19/2015   MCV 85.1 11/19/2015   PLT 110* 11/19/2015      Chemistry      Component Value Date/Time   NA 136 11/19/2015 0940   NA 135 08/22/2015 1443   K 3.9 11/19/2015 0940   K 4.2 08/22/2015 1443   CL 103 08/22/2015 1443   CO2 21* 11/19/2015 0940   CO2 25 08/22/2015 1443   BUN 15.9 11/19/2015 0940   BUN 19 08/22/2015 1443   CREATININE 0.8 11/19/2015 0940   CREATININE 0.84 08/22/2015 1443   CREATININE 0.83 07/09/2015 1520      Component Value Date/Time   CALCIUM 8.5 11/19/2015 0940   CALCIUM 8.6 08/22/2015 1443   ALKPHOS 80 11/19/2015 0940   ALKPHOS 79 07/09/2015 1520   AST 21 11/19/2015 0940   AST 14 07/09/2015 1520   ALT 28 11/19/2015 0940   ALT 10 07/09/2015 1520   BILITOT 0.62 11/19/2015 0940   BILITOT 0.3 07/09/2015 1520       RADIOGRAPHIC STUDIES: No results found.  ASSESSMENT AND PLAN: This is a very pleasant 82year old white male  recently diagnosed with a stage IIIa non-small cell lung cancer, squamous cell carcinoma and currently undergoing a course of concurrent chemoradiation with weekly carboplatin and paclitaxel status post 6 cycles. He tolerated the first 6 cycles of his treatment well with no significant adverse effect except for fatigue and odynophagia.    The patient was seen with Dr. MJulien Nordmann His total white blood count is low but enough for treatment today. I recommended for the patient to proceed with cycle #7 today as a scheduled. He will return in approximately 6 weeks for follow-up visit. He will have labs and a restaging CT scan prior to that visit. For the odynophagia, he will continue on Carafate and Nexium. The patient was advised to call immediately if he has any concerning symptoms in the interval.  The patient voices understanding of current disease status and treatment options and is in agreement with the current care plan.  All questions were answered. The patient knows to call the clinic with any problems, questions or concerns. We can certainly see the patient much sooner if necessary.  Mikey Bussing, DNP, AGPCNP-BC, AOCNP  ADDENDUM: Hematology/Oncology Attending: I had a face to face encounter with the patient today. I recommended his care plan. This is a very pleasant 82 years old white male with a stage IIIa non-small cell lung cancer, squamous cell carcinoma. He is currently undergoing a course of concurrent chemoradiation with weekly carboplatin and paclitaxel status post 6 cycles. He completed the last fraction of radiation today. Has been tolerating his treatment fairly well except for mild odynophagia. I recommended for the patient to proceed with the last cycle of his systemic chemotherapy today as a scheduled. I will see him back for follow-up visit in 6 weeks for reevaluation with repeat CT scan of the chest for restaging of his disease. For the odynophagia, he will continue on  Carafate and Nexium. The patient was advised to call immediately if he has any concerning symptoms in the interval.  Disclaimer: This note was dictated with voice recognition software. Similar sounding words can inadvertently be transcribed and may be missed upon review. Eilleen Kempf., MD 11/19/2015

## 2015-11-19 NOTE — Patient Instructions (Signed)
[facility] Discharge Instructions for Patients Receiving Chemotherapy  Today you received the following chemotherapy agents:  TAXOL AND CARBOPLATIN.  To help prevent nausea and vomiting after your treatment, we encourage you to take your nausea medication as prescribed.   If you develop nausea and vomiting that is not controlled by your nausea medication, call the clinic.   BELOW ARE SYMPTOMS THAT SHOULD BE REPORTED IMMEDIATELY:  *FEVER GREATER THAN 100.5 F  *CHILLS WITH OR WITHOUT FEVER  NAUSEA AND VOMITING THAT IS NOT CONTROLLED WITH YOUR NAUSEA MEDICATION  *UNUSUAL SHORTNESS OF BREATH  *UNUSUAL BRUISING OR BLEEDING  TENDERNESS IN MOUTH AND THROAT WITH OR WITHOUT PRESENCE OF ULCERS  *URINARY PROBLEMS  *BOWEL PROBLEMS  UNUSUAL RASH Items with * indicate a potential emergency and should be followed up as soon as possible.  Feel free to call the clinic you have any questions or concerns. The clinic phone number is [phone].  Please show the [place] at check-in to the Emergency Department and triage nurse.

## 2015-11-20 ENCOUNTER — Telehealth: Payer: Self-pay | Admitting: Internal Medicine

## 2015-11-20 NOTE — Telephone Encounter (Signed)
s.w. pt wife and advised on July appt....ok and aware

## 2015-11-22 ENCOUNTER — Telehealth: Payer: Self-pay | Admitting: *Deleted

## 2015-11-22 NOTE — Progress Notes (Signed)
  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 During treatment the patient experienced esophagitis which was managed with Carafate. He developed a rash at the treatment area which he managed with Sonafine.   Plan: The patient has completed radiation treatment. The patient will return to radiation oncology clinic for routine followup in one month. I advised them to call or return sooner if they have any questions or concerns related to their recovery or treatment.  -----------------------------------  Blair Promise, PhD, MD  This document serves as a record of services personally performed by Gery Pray, MD. It was created on his behalf by Derek Mound, a trained medical scribe. The creation of this record is based on the scribe's personal observations and the provider's statements to them. This document has been checked and approved by the attending provider.

## 2015-11-22 NOTE — Telephone Encounter (Signed)
Oncology Nurse Navigator Documentation  Oncology Nurse Navigator Flowsheets 11/22/2015  Navigator Encounter Type Telephone  Telephone Outgoing Call  Treatment Phase Post-Tx Follow-up  Barriers/Navigation Needs Education  Education Other  Interventions Education Method  Education Method Verbal  Acuity Level 1  Time Spent with Patient 30   Patient's wife called due to patient not feeling well.  I called her back and listened as she explained how he was feeling.  I spoke with Dr. Julien Nordmann.  He stated it will take a while to feel better but if he is not feeling well tomorrow that he needs to be seen with symptom management.  I explained to her what Dr. Julien Nordmann stated.  I also gave her the phone number to call if patient is not feeling well tomorrow.  She was thankful for the help.

## 2015-12-24 ENCOUNTER — Telehealth: Payer: Self-pay | Admitting: *Deleted

## 2015-12-24 NOTE — Telephone Encounter (Signed)
Fax from [place] in Radiology, labs needed prior to [place]. POF entered.

## 2015-12-26 ENCOUNTER — Telehealth: Payer: Self-pay | Admitting: Internal Medicine

## 2015-12-26 NOTE — Telephone Encounter (Signed)
Spoke with patient. Lab appointment confirmed for 12/31/15 at 8:00 AM. Merleen Nicely.

## 2015-12-31 ENCOUNTER — Telehealth: Payer: Self-pay | Admitting: *Deleted

## 2015-12-31 ENCOUNTER — Encounter: Payer: Self-pay | Admitting: Internal Medicine

## 2015-12-31 ENCOUNTER — Ambulatory Visit (HOSPITAL_BASED_OUTPATIENT_CLINIC_OR_DEPARTMENT_OTHER): Payer: Medicare Other | Admitting: Internal Medicine

## 2015-12-31 ENCOUNTER — Ambulatory Visit (HOSPITAL_COMMUNITY)
Admission: RE | Admit: 2015-12-31 | Discharge: 2015-12-31 | Disposition: A | Discharge status: Home / Self Care | Payer: Medicare Other | Source: Ambulatory Visit | Attending: Oncology | Admitting: Oncology

## 2015-12-31 ENCOUNTER — Other Ambulatory Visit (HOSPITAL_BASED_OUTPATIENT_CLINIC_OR_DEPARTMENT_OTHER): Payer: Medicare Other

## 2015-12-31 ENCOUNTER — Encounter (HOSPITAL_COMMUNITY): Payer: Self-pay

## 2015-12-31 VITALS — BP 132/68 | HR 67 | Temp 97.8°F | Resp 18 | Ht 70.0 in | Wt 171.2 lb

## 2015-12-31 DIAGNOSIS — I700 Atherosclerosis of aorta: Secondary | ICD-10-CM | POA: Diagnosis not present

## 2015-12-31 DIAGNOSIS — R918 Other nonspecific abnormal finding of lung field: Secondary | ICD-10-CM | POA: Insufficient documentation

## 2015-12-31 DIAGNOSIS — C3432 Malignant neoplasm of lower lobe, left bronchus or lung: Secondary | ICD-10-CM

## 2015-12-31 DIAGNOSIS — R590 Localized enlarged lymph nodes: Secondary | ICD-10-CM | POA: Insufficient documentation

## 2015-12-31 DIAGNOSIS — R1310 Dysphagia, unspecified: Secondary | ICD-10-CM

## 2015-12-31 DIAGNOSIS — C3492 Malignant neoplasm of unspecified part of left bronchus or lung: Secondary | ICD-10-CM

## 2015-12-31 DIAGNOSIS — Z5111 Encounter for antineoplastic chemotherapy: Secondary | ICD-10-CM

## 2015-12-31 HISTORY — DX: Malignant neoplasm of unspecified part of unspecified bronchus or lung: C34.90

## 2015-12-31 LAB — COMPREHENSIVE METABOLIC PANEL
ALT: 17 U/L (ref 0–55)
ANION GAP: 11 meq/L (ref 3–11)
AST: 18 U/L (ref 5–34)
Albumin: 3.5 g/dL (ref 3.5–5.0)
Alkaline Phosphatase: 89 U/L (ref 40–150)
BUN: 17.5 mg/dL (ref 7.0–26.0)
CHLORIDE: 109 meq/L (ref 98–109)
CO2: 20 meq/L — AB (ref 22–29)
Calcium: 8.8 mg/dL (ref 8.4–10.4)
Creatinine: 0.9 mg/dL (ref 0.7–1.3)
EGFR: 75 mL/min/{1.73_m2} — AB (ref >90)
Glucose: 128 mg/dl (ref 70–140)
Potassium: 3.9 mEq/L (ref 3.5–5.1)
Sodium: 140 mEq/L (ref 136–145)
Total Bilirubin: 0.47 mg/dL (ref 0.20–1.20)
Total Protein: 6.9 g/dL (ref 6.4–8.3)

## 2015-12-31 LAB — CBC WITH DIFFERENTIAL/PLATELET
BASO%: 0.6 % (ref 0.0–2.0)
Basophils Absolute: 0 10*3/uL (ref 0.0–0.1)
EOS%: 5.1 % (ref 0.0–7.0)
Eosinophils Absolute: 0.2 10*3/uL (ref 0.0–0.5)
HCT: 37.7 % — ABNORMAL LOW (ref 38.4–49.9)
HGB: 12.9 g/dL — ABNORMAL LOW (ref 13.0–17.1)
LYMPH%: 25.8 % (ref 14.0–49.0)
MCH: 30.9 pg (ref 27.2–33.4)
MCHC: 34.2 g/dL (ref 32.0–36.0)
MCV: 90.2 fL (ref 79.3–98.0)
MONO#: 0.7 10*3/uL (ref 0.1–0.9)
MONO%: 14.3 % — AB (ref 0.0–14.0)
NEUT#: 2.5 10*3/uL (ref 1.5–6.5)
NEUT%: 54.2 % (ref 39.0–75.0)
PLATELETS: 112 10*3/uL — AB (ref 140–400)
RBC: 4.18 10*6/uL — AB (ref 4.20–5.82)
RDW: 16.6 % — ABNORMAL HIGH (ref 11.0–14.6)
WBC: 4.7 10*3/uL (ref 4.0–10.3)
lymph#: 1.2 10*3/uL (ref 0.9–3.3)

## 2015-12-31 MED ORDER — IOPAMIDOL (ISOVUE-300) INJECTION 61%: 100.0000 mL | Freq: Once | INTRAVENOUS | Status: DC | PRN

## 2015-12-31 MED ORDER — IOPAMIDOL (ISOVUE-300) INJECTION 61%
75.0000 mL | Freq: Once | INTRAVENOUS | Status: AC | PRN
Start: 2015-12-31 — End: 2015-12-31
  Administered 2015-12-31: 75 mL via INTRAVENOUS

## 2015-12-31 NOTE — Telephone Encounter (Signed)
Oncology Nurse Navigator Documentation  Oncology Nurse Navigator Flowsheets 12/31/2015  Navigator Encounter Type Telephone  Telephone Outgoing Call  Treatment Phase Post-Tx Follow-up  Barriers/Navigation Needs Education  Interventions Education Method  Education Method Verbal  Acuity Level 1  Time Spent with Patient 30   I received a message from patient's wife that she needed to speak with me.  I called her back.  She had questions about next steps if he did not get treatment.  I explained observation and that he will have routine follow up scans and appt to see Dr. Julien Nordmann.  They still need more time to figure out next steps but were thankful for the call and update.

## 2015-12-31 NOTE — Progress Notes (Signed)
[place] [phone]   [phone]  OFFICE PROGRESS NOTE  [name], MD [place] [address]  DIAGNOSIS: Stage IIIA (T3, N2, M0) non-small cell lung cancer, poorly differentiated squamous cell carcinoma diagnosed in February 2017 with PDL 1 expression of 35%, presented with large left lower lobe lung mass in addition to left upper lobe lung nodule and mediastinal lymphadenopathy.  PRIOR THERAPY: Course of concurrent chemoradiation with weekly carboplatin for AUC of 2 and paclitaxel 45 MG/M2. Status post 7 cycles, last dose was given 11/19/2015 with partial response.  CURRENT THERAPY:   INTERVAL HISTORY: Jason Summers 82 y.o. male returns to the clinic today for follow-up visit accompanied by his wife. The patient tolerated the previous course of his treatment with concurrent chemoradiation fairly well except for fatigue and mild odynophagia. He was treated with Carafate as well as Nexium and feeling much better. He did not have any significant weight loss. He denied having any significant chest pain, shortness of breath, cough or hemoptysis.The patient has no nausea or vomiting, no fever or chills. He had repeat CT scan of the chest performed recently and he is here for evaluation and discussion of his scan results.  MEDICAL HISTORY: Past Medical History:  Diagnosis Date  . Arthritis   . Cancer ([place])    basal cell on right temple  . Hemorrhoids   . Lateral epicondylitis (tennis elbow)   . Non-small cell lung cancer (NSCLC) ([place]) dx'f 07/30/15  . Odynophagia 11/06/2015  . Spondylosis of cervical joint     ALLERGIES:  has No Known Allergies.  MEDICATIONS:  Current Outpatient Prescriptions  Medication Sig Dispense Refill  . aspirin 81 MG EC tablet Take 81 mg by mouth every other day. Reported on 10/16/2015    . Eyelid Cleansers (AVENOVA) 0.01 % SOLN     . fish oil-omega-3 fatty acids 1000 MG capsule Take 2 g  by mouth 2 (two) times daily.      Marland Kitchen glucosamine-chondroitin 500-400 MG tablet Take 1 tablet by mouth 2 (two) times daily. Reported on 09/11/2015    . ibuprofen (ADVIL,MOTRIN) 200 MG tablet Take 400 mg by mouth daily as needed for moderate pain. Reported on 09/27/2015    . LORazepam (ATIVAN) 0.5 MG tablet Take 1 tablet (0.5 mg total) by mouth 2 (two) times daily as needed for anxiety. 30 tablet 2  . omega-3 acid ethyl esters (LOVAZA) 1 g capsule Take by mouth.    Marland Kitchen omeprazole (PRILOSEC) 20 MG capsule Take 20 mg by mouth as needed (heartburn).     . prochlorperazine (COMPAZINE) 10 MG tablet Take 1 tablet (10 mg total) by mouth every 6 (six) hours as needed for nausea or vomiting. 30 tablet 0  . sucralfate (CARAFATE) 1 GM/10ML suspension Take 10 mLs (1 g total) by mouth 4 (four) times daily -  with meals and at bedtime. 420 mL 0  . Wound Dressings (SONAFINE EX) Apply topically.     No current facility-administered medications for this visit.     SURGICAL HISTORY:  Past Surgical History:  Procedure Laterality Date  . COLONOSCOPY  2003  . KNEE ARTHROSCOPY  2006   right    REVIEW OF SYSTEMS:  Constitutional: positive for fatigue Eyes: negative Ears, nose, mouth, throat, and face: negative Respiratory: negative Cardiovascular: negative Gastrointestinal: positive for odynophagia Genitourinary:negative Integument/breast: negative Hematologic/lymphatic: negative Musculoskeletal:negative Neurological: negative Behavioral/Psych: negative Endocrine: negative Allergic/Immunologic: negative   PHYSICAL EXAMINATION: General appearance: alert,  cooperative, fatigued and no distress Head: Normocephalic, without obvious abnormality, atraumatic Neck: no adenopathy, no JVD, supple, symmetrical, trachea midline and thyroid not enlarged, symmetric, no tenderness/mass/nodules Lymph nodes: Cervical, supraclavicular, and axillary nodes normal. Resp: clear to auscultation bilaterally Back: symmetric, no  curvature. ROM normal. No CVA tenderness. Cardio: regular rate and rhythm, S1, S2 normal, no murmur, click, rub or gallop GI: soft, non-tender; bowel sounds normal; no masses,  no organomegaly Extremities: extremities normal, atraumatic, no cyanosis or edema Neurologic: Alert and oriented X 3, normal strength and tone. Normal symmetric reflexes. Normal coordination and gait  ECOG PERFORMANCE STATUS: 1 - Symptomatic but completely ambulatory  Blood pressure 132/68, pulse 67, temperature 97.8 F (36.6 C), temperature source Oral, resp. rate 18, height _0  (1.778 m), weight 171 lb 3.2 oz (77.7 kg), SpO2 100 %.  LABORATORY DATA: Lab Results  Component Value Date   WBC 4.7 12/31/2015   HGB 12.9 (L) 12/31/2015   HCT 37.7 (L) 12/31/2015   MCV 90.2 12/31/2015   PLT 112 (L) 12/31/2015      Chemistry      Component Value Date/Time   NA 140 12/31/2015 0748   K 3.9 12/31/2015 0748   CL 103 08/22/2015 1443   CO2 20 (L) 12/31/2015 0748   BUN 17.5 12/31/2015 0748   CREATININE 0.9 12/31/2015 0748      Component Value Date/Time   CALCIUM 8.8 12/31/2015 0748   ALKPHOS 89 12/31/2015 0748   AST 18 12/31/2015 0748   ALT 17 12/31/2015 0748   BILITOT 0.47 12/31/2015 0748       RADIOGRAPHIC STUDIES: Ct Chest W Contrast  Result Date: 12/31/2015 CLINICAL DATA:  Stage IIIA poorly differentiated non-small-cell lung carcinoma of the left lung. EXAM: CT CHEST WITH CONTRAST TECHNIQUE: Multidetector CT imaging of the chest was performed during intravenous contrast administration. CONTRAST:  26m ISOVUE-300 IOPAMIDOL (ISOVUE-300) INJECTION 61% COMPARISON:  PET-CT 09/05/2015.  Chest CT 08/23/2015. FINDINGS: Cardiovascular: Heart size normal. No pericardial effusion. Coronary artery calcification is noted. Mediastinum/Nodes: 2.1 cm short axis AP window lymph node seen previously has decreased to 1.1 cm short axis. 1.7 cm short axis precarinal lymph node is now 1.1 cm. There is no hilar lymphadenopathy.  The esophagus has normal imaging features. Lungs/Pleura: Dominant posterior left lower lobe pulmonary nodule now measures 1.7 x 2.0 cm compared to 3.5 x 3.9 cm previously. Lingular nodule measured previously at 1.9 x 1.4 cm now measures 1.0 x 0.8 cm. 3 mm nodule lateral left upper lobe image 63 series 4) is stable in the interval. 3 mm right upper lobe nodule seen on the same image is stable. The nodular pleural-parenchymal scarring in both lung apices is unchanged. There is compressive atelectasis in the lung bases bilaterally. Upper Abdomen: Multiple liver cysts again noted. No adrenal nodule or mass. Abdominal aortic atherosclerosis is evident. Musculoskeletal: Bone windows reveal no worrisome lytic or sclerotic osseous lesions. IMPRESSION: 1. Interval decrease in the dominant left lower lobe and lingular nodules. 2. Interval decrease in mediastinal lymphadenopathy. 3. Stable tiny bilateral pulmonary nodules. 4. No new or progressive findings. 5. Abdominal aortic atherosclerosis. Electronically Signed   By: EMisty StanleyM.D.   On: 12/31/2015 10:26   ASSESSMENT AND PLAN: This is a very pleasant 82years old white male recently diagnosed with a stage IIIa non-small cell lung cancer, squamous cell carcinoma and currently undergoing a course of concurrent chemoradiation with weekly carboplatin and paclitaxel status post 7 cycles. The recent CT scan of the chest showed  improvement of his disease. I discussed the scan results and showed the images to the patient and his wife today. I discussed with him consideration of treatment with 3 cycles of consolidation chemotherapy with reduced dose of carboplatin for AUC of 5 and paclitaxel 175 MG/M2 every 3 weeks with Neulasta support. I discussed with the patient adverse effect of this treatment including but not limited to alopecia, myelosuppression, nausea and vomiting, peripheral neuropathy, liver or renal dysfunction. He was also given the option of observation and  close monitoring. The patient and his wife would like to take some time to think about this option before proceeding with the consolidation chemotherapy. They will call with her decision in the next few days. For the odynophagia, he will continue on Carafate and Nexium. The patient was advised to call immediately if he has any concerning symptoms in the interval. The patient voices understanding of current disease status and treatment options and is in agreement with the current care plan.  All questions were answered. The patient knows to call the clinic with any problems, questions or concerns. We can certainly see the patient much sooner if necessary.  Disclaimer: This note was dictated with voice recognition software. Similar sounding words can inadvertently be transcribed and may not be corrected upon review.

## 2015-12-31 NOTE — Telephone Encounter (Signed)
"  [employer] husband was seen today.  We received news and were in shock so we did not ask questions."  This nurse offered to answer questions.  "We have several because another round of chemotherapy was suggested.  What happens next if he does not want to proceed with more chemotherapy.  How often will he be seen ...  Not sure if we need Hinton Dyer or the nurse for today."  Call transferred to ext [phone] per wife's request.

## 2016-01-03 ENCOUNTER — Telehealth: Payer: Self-pay | Admitting: *Deleted

## 2016-01-03 NOTE — Telephone Encounter (Signed)
Oncology Nurse Navigator Documentation  Oncology Nurse Navigator Flowsheets 01/03/2016  Navigator Encounter Type Telephone  Telephone Outgoing Call  Treatment Phase Other  Barriers/Navigation Needs Education  Education Other  Interventions Education Method  Education Method Verbal  Acuity Level 1  Acuity Level 1 Minimal follow up required  Time Spent with Patient 15   Jason Summers called.  She had questions about more treatment.  I clarified.  She was thankful for the help.

## 2016-01-08 ENCOUNTER — Ambulatory Visit (INDEPENDENT_AMBULATORY_CARE_PROVIDER_SITE_OTHER): Payer: Medicare Other | Admitting: Internal Medicine

## 2016-01-08 ENCOUNTER — Encounter: Payer: Self-pay | Admitting: Internal Medicine

## 2016-01-08 VITALS — BP 118/68 | HR 76 | Temp 97.8°F | Ht 67.75 in | Wt 170.2 lb

## 2016-01-08 DIAGNOSIS — M150 Primary generalized (osteo)arthritis: Secondary | ICD-10-CM

## 2016-01-08 DIAGNOSIS — C3492 Malignant neoplasm of unspecified part of left bronchus or lung: Secondary | ICD-10-CM | POA: Diagnosis not present

## 2016-01-08 DIAGNOSIS — Z0000 Encounter for general adult medical examination without abnormal findings: Secondary | ICD-10-CM

## 2016-01-08 DIAGNOSIS — M159 Polyosteoarthritis, unspecified: Secondary | ICD-10-CM

## 2016-01-08 DIAGNOSIS — R599 Enlarged lymph nodes, unspecified: Secondary | ICD-10-CM | POA: Diagnosis not present

## 2016-01-08 DIAGNOSIS — R590 Localized enlarged lymph nodes: Secondary | ICD-10-CM

## 2016-01-08 NOTE — Progress Notes (Signed)
Pre visit review using our clinic review tool, if applicable. No additional management support is needed unless otherwise documented below in the visit note. 

## 2016-01-08 NOTE — Progress Notes (Signed)
Subjective:    Patient ID: Jason Summers, male    DOB: [birth year], 82 y.o.   MRN: [id number]  HPI  82 year old patient who is in today for an annual exam.   He is followed by oncology with  Stage IIIA (T3, N2, M0) non-small cell lung cancer, poorly differentiated squamous cell carcinoma diagnosed in February 2017 with PDL 1 expression of 35%, presented with large left lower lobe lung mass in addition to left upper lobe lung nodule and mediastinal lymphadenopathy.  He has had chemoradiation and contemplating additional chemotherapy versus observation over the next 3 months.  He and his wife are favoring the latter option    Medical problems include  mild dyslipidemia.  He has a history of gastroesophageal reflux disease and does take omeprazole periodically.  He has had a  left cataract extraction surgery, but still having some visual difficulties.  He has a history of osteoarthritis, especially involving both knees.  He has had cortisone injections in the past   Complaints today include some general weakness and slowed.  Cognition that he attributes to chemotherapy.  Past Medical History:  Diagnosis Date  . Arthritis   . Cancer ([place])    basal cell on right temple  . Hemorrhoids   . Lateral epicondylitis (tennis elbow)   . Non-small cell lung cancer (NSCLC) ([place] [facility]) dx'f 07/30/15  . Odynophagia 11/06/2015  . Spondylosis of cervical joint     Social History   Social History  . Marital status: Married    Spouse name: N/A  . Number of children: N/A  . Years of education: N/A   Occupational History  . Retired      Games developer   Social History Main Topics  . Smoking status: Former Smoker    Packs/day: 0.20    Years: 62.00    Types: Cigarettes    Quit date: 03/10/2011  . Smokeless tobacco: Never Used     Comment: smokes 1 ciagettes occasionally  . Alcohol use 4.2 oz/week    7 Glasses of wine per week     Comment: 1-2 glasses of wine a day  . Drug use: No  . Sexual activity: Yes     Other Topics Concern  . Not on file   Social History Narrative  . No narrative on file    Past Surgical History:  Procedure Laterality Date  . COLONOSCOPY  2003  . KNEE ARTHROSCOPY  2006   right    Family History  Problem Relation Age of Onset  . Hypertension Mother   . Heart disease Mother   . Coronary artery disease      No Known Allergies  Current Outpatient Prescriptions on File Prior to Visit  Medication Sig Dispense Refill  . aspirin 81 MG EC tablet Take 81 mg by mouth every other day. Reported on 10/16/2015    . Eyelid Cleansers (AVENOVA) 0.01 % SOLN     . fish oil-omega-3 fatty acids 1000 MG capsule Take 2 g by mouth 2 (two) times daily.      Marland Kitchen glucosamine-chondroitin 500-400 MG tablet Take 1 tablet by mouth 2 (two) times daily. Reported on 09/11/2015    . ibuprofen (ADVIL,MOTRIN) 200 MG tablet Take 400 mg by mouth daily as needed for moderate pain. Reported on 09/27/2015    . LORazepam (ATIVAN) 0.5 MG tablet Take 1 tablet (0.5 mg total) by mouth 2 (two) times daily as needed for anxiety. 30 tablet 2  . omega-3 acid ethyl esters (LOVAZA) 1 g capsule  Take by mouth.    Marland Kitchen omeprazole (PRILOSEC) 20 MG capsule Take 20 mg by mouth as needed (heartburn).     . Wound Dressings (SONAFINE EX) Apply topically.     No current facility-administered medications on file prior to visit.     BP 118/68 (BP Location: Left Arm, Patient Position: Sitting, Cuff Size: Normal)   Pulse 76   Temp 97.8 F (36.6 C) (Oral)   Ht 5' 7.75" (1.721 m)   Wt 170 lb 3.2 oz (77.2 kg)   SpO2 97%   BMI 26.07 kg/m    1. Risk factors, based on past  M,S,F history.  Cardiovascular risk factors include age and mild dyslipidemia  2.  Physical activities: Activities more limited since treatment with chemotherapy, radiation   3.  Depression/mood: History mild anxiety but no major depression or mood disorder  4.  Hearing: No major deficits  5.  ADL's: Totally independent in all aspects of daily  living  6.  Fall risk: Low  7.  Home safety: No problems identified  8.  Height weight, and visual acuity; height and weight stable has a cataract extraction surgery  9.  Counseling: Heart healthy diet regular exercise.  All encouraged  10. Lab orders based on risk factors: We'll review laboratory update including lipid profile  11. Referral : Follow-up oncology  12. Care plan: Continue efforts at risk factor modification.  Heart healthy diet and aerobic activities.  Encouraged   13. Cognitive assessment: Alert and oriented with normal affect.  No cognitive dysfunction   14. Screening: Patient had his final colonoscopy in 2013.  We'll continue annual preventive exams with screening lab.  Annual eye examinations.  Encouraged  15. Provider List Update: Primary care medicine and ophthalmology as well as oncology     Review of Systems  Constitutional: Negative for appetite change, chills, fatigue and fever.  HENT: Negative for congestion, dental problem, ear pain, hearing loss, sore throat, tinnitus, trouble swallowing and voice change.   Eyes: Positive for visual disturbance. Negative for pain and discharge.  Respiratory: Negative for cough, chest tightness, wheezing and stridor.   Cardiovascular: Negative for chest pain, palpitations and leg swelling.  Gastrointestinal: Negative for abdominal distention, abdominal pain, blood in stool, constipation, diarrhea, nausea and vomiting.  Genitourinary: Negative for difficulty urinating, discharge, flank pain, genital sores, hematuria and urgency.  Musculoskeletal: Positive for arthralgias. Negative for back pain, gait problem, joint swelling, myalgias and neck stiffness.  Skin: Negative for rash.  Neurological: Negative for dizziness, syncope, speech difficulty, weakness, numbness and headaches.  Hematological: Negative for adenopathy. Does not bruise/bleed easily.  Psychiatric/Behavioral: Negative for behavioral problems and dysphoric  mood. The patient is nervous/anxious.        Objective:   Physical Exam  Constitutional: He is oriented to person, place, and time. He appears well-developed.  HENT:  Head: Normocephalic.  Right Ear: External ear normal.  Left Ear: External ear normal.  Eyes: Conjunctivae and EOM are normal.  Neck: Normal range of motion.  Cardiovascular: Normal rate, normal heart sounds and intact distal pulses.   Pedal pulses, intact  Pulmonary/Chest: Breath sounds normal.  Abdominal: Bowel sounds are normal.  Musculoskeletal: Normal range of motion. He exhibits edema. He exhibits no tenderness.  Trace pedal and ankle edema  Neurological: He is alert and oriented to person, place, and time.  Skin:  Resolving postradiation postinflammatory hyperpigmentation of the posterior back  Psychiatric: He has a normal mood and affect. His behavior is normal.  Assessment & Plan:  Preventive health examination Stage IIIA (T3, N2, M0) non-small cell lung cancer, poorly differentiated  Osteoarthritis.  Status post bilateral knee cortisone injections History mild dyslipidemia Status post recent left cataract extraction surgery  Close follow-up oncology Return here in 6 months or as needed Medications updated  [name], MD

## 2016-01-08 NOTE — Patient Instructions (Signed)
Oncology follow-up as scheduled    It is important that you exercise regularly, at least 20 minutes 3 to 4 times per week.  If you develop chest pain or shortness of breath seek  medical attention.  Return in 6 months for follow-up

## 2016-01-10 ENCOUNTER — Encounter: Payer: Self-pay | Admitting: *Deleted

## 2016-01-10 ENCOUNTER — Telehealth: Payer: Self-pay | Admitting: *Deleted

## 2016-01-10 DIAGNOSIS — C3492 Malignant neoplasm of unspecified part of left bronchus or lung: Secondary | ICD-10-CM

## 2016-01-10 NOTE — Telephone Encounter (Signed)
Oncology Nurse Navigator Documentation  Oncology Nurse Navigator Flowsheets 01/10/2016  Navigator Encounter Type Telephone  Telephone Incoming Call  Treatment Phase Other  Barriers/Navigation Needs Coordination of Care  Interventions Coordination of Care  Coordination of Care Appts  Acuity Level 2  Acuity Level 2 Assistance expediting appointments  Time Spent with Patient 30   Jason Summers called.  She stated her husband has decided not to persure more TX at this time.  He would like to be on observation.  I updated Dr. Julien Nordmann.  He gave me verbal order for follow up in 3 months with CT Chest scan before.  Jason Summers updated on plan and was thankful for the help.

## 2016-01-15 ENCOUNTER — Encounter: Payer: Self-pay | Admitting: *Deleted

## 2016-01-15 ENCOUNTER — Telehealth: Payer: Self-pay | Admitting: Internal Medicine

## 2016-01-15 NOTE — Telephone Encounter (Signed)
FAXED PT [facility] [phone] FAXED [phone]

## 2016-01-18 ENCOUNTER — Telehealth: Payer: Self-pay

## 2016-01-18 NOTE — Telephone Encounter (Addendum)
Wife called. They have been talking and feel that if [name] feels he really needs to take the chemo they will go ahead. If [name] feels pt can wait until after their granddaughter's wedding they would like to do that. The wedding is in September. If they do wait, they want to have all preliminary work needed to get started done, so when they get back from the wedding he would be getting the chemo right away.

## 2016-01-18 NOTE — Telephone Encounter (Signed)
Note to [place].

## 2016-01-21 ENCOUNTER — Telehealth: Payer: Self-pay | Admitting: *Deleted

## 2016-01-21 DIAGNOSIS — C3492 Malignant neoplasm of unspecified part of left bronchus or lung: Secondary | ICD-10-CM

## 2016-01-21 NOTE — Telephone Encounter (Signed)
Oncology Nurse Navigator Documentation  Oncology Nurse Navigator Flowsheets 01/21/2016  Navigator Encounter Type Telephone/Ms. Snodgrass called and she stated her husband has changed his mind and would like to pursue chemotherapy.  He would like to wait until after his granddaughter's wedding on sept 23rd.  I updated Dr. Julien Nordmann.  He states for patient to be scheduled for a follow up with him on Sept 27th or 28th.  I will complete LOS for scheduling to call patient and arrange.  I updated Ms. Duby of plan.    Telephone Outgoing Call  Treatment Phase Other  Barriers/Navigation Needs Coordination of Care  Interventions Coordination of Care  Coordination of Care Appts  Acuity Level 2  Acuity Level 2 Assistance expediting appointments;Educational needs  Time Spent with Patient 30

## 2016-01-28 ENCOUNTER — Telehealth: Payer: Self-pay | Admitting: Medical Oncology

## 2016-01-28 NOTE — Telephone Encounter (Signed)
requests appt -pt not feeling well-" weak, coughing a lot , not feeling good". Per [facility] tomorrow.

## 2016-01-28 NOTE — Telephone Encounter (Signed)
Message sent to scheduler.

## 2016-01-29 ENCOUNTER — Encounter (HOSPITAL_COMMUNITY): Payer: Self-pay

## 2016-01-29 ENCOUNTER — Other Ambulatory Visit (HOSPITAL_BASED_OUTPATIENT_CLINIC_OR_DEPARTMENT_OTHER): Payer: Medicare Other

## 2016-01-29 ENCOUNTER — Ambulatory Visit (HOSPITAL_COMMUNITY)
Admission: RE | Admit: 2016-01-29 | Discharge: 2016-01-29 | Disposition: A | Discharge status: Home / Self Care | Payer: Medicare Other | Source: Ambulatory Visit | Attending: Nurse Practitioner | Admitting: Nurse Practitioner

## 2016-01-29 ENCOUNTER — Ambulatory Visit (HOSPITAL_BASED_OUTPATIENT_CLINIC_OR_DEPARTMENT_OTHER): Payer: Medicare Other | Admitting: Nurse Practitioner

## 2016-01-29 VITALS — BP 112/60 | HR 86 | Temp 98.1°F | Resp 18 | Ht 67.75 in | Wt 171.7 lb

## 2016-01-29 DIAGNOSIS — G4452 New daily persistent headache (NDPH): Secondary | ICD-10-CM

## 2016-01-29 DIAGNOSIS — R591 Generalized enlarged lymph nodes: Secondary | ICD-10-CM | POA: Insufficient documentation

## 2016-01-29 DIAGNOSIS — J90 Pleural effusion, not elsewhere classified: Secondary | ICD-10-CM | POA: Diagnosis not present

## 2016-01-29 DIAGNOSIS — C3432 Malignant neoplasm of lower lobe, left bronchus or lung: Secondary | ICD-10-CM

## 2016-01-29 DIAGNOSIS — R51 Headache: Secondary | ICD-10-CM | POA: Insufficient documentation

## 2016-01-29 DIAGNOSIS — R1310 Dysphagia, unspecified: Secondary | ICD-10-CM

## 2016-01-29 DIAGNOSIS — C3492 Malignant neoplasm of unspecified part of left bronchus or lung: Secondary | ICD-10-CM | POA: Insufficient documentation

## 2016-01-29 DIAGNOSIS — R1319 Other dysphagia: Secondary | ICD-10-CM

## 2016-01-29 DIAGNOSIS — R519 Headache, unspecified: Secondary | ICD-10-CM

## 2016-01-29 LAB — CBC WITH DIFFERENTIAL/PLATELET
BASO%: 1 % (ref 0.0–2.0)
BASOS ABS: 0.1 10*3/uL (ref 0.0–0.1)
EOS%: 2.6 % (ref 0.0–7.0)
Eosinophils Absolute: 0.2 10*3/uL (ref 0.0–0.5)
HCT: 39.2 % (ref 38.4–49.9)
HEMOGLOBIN: 13 g/dL (ref 13.0–17.1)
LYMPH%: 12.1 % — AB (ref 14.0–49.0)
MCH: 29.4 pg (ref 27.2–33.4)
MCHC: 33.1 g/dL (ref 32.0–36.0)
MCV: 88.9 fL (ref 79.3–98.0)
MONO#: 1.1 10*3/uL — ABNORMAL HIGH (ref 0.1–0.9)
MONO%: 11.6 % (ref 0.0–14.0)
NEUT#: 6.6 10*3/uL — ABNORMAL HIGH (ref 1.5–6.5)
NEUT%: 72.7 % (ref 39.0–75.0)
Platelets: 215 10*3/uL (ref 140–400)
RBC: 4.41 10*6/uL (ref 4.20–5.82)
RDW: 13.2 % (ref 11.0–14.6)
WBC: 9.1 10*3/uL (ref 4.0–10.3)
lymph#: 1.1 10*3/uL (ref 0.9–3.3)

## 2016-01-29 LAB — COMPREHENSIVE METABOLIC PANEL
ALT: 14 U/L (ref 0–55)
AST: 20 U/L (ref 5–34)
Albumin: 3 g/dL — ABNORMAL LOW (ref 3.5–5.0)
Alkaline Phosphatase: 81 U/L (ref 40–150)
Anion Gap: 9 mEq/L (ref 3–11)
BUN: 17.7 mg/dL (ref 7.0–26.0)
CHLORIDE: 106 meq/L (ref 98–109)
CO2: 22 mEq/L (ref 22–29)
Calcium: 9.2 mg/dL (ref 8.4–10.4)
Creatinine: 0.9 mg/dL (ref 0.7–1.3)
EGFR: 81 mL/min/{1.73_m2} — ABNORMAL LOW (ref >90)
GLUCOSE: 109 mg/dL (ref 70–140)
POTASSIUM: 4.2 meq/L (ref 3.5–5.1)
SODIUM: 137 meq/L (ref 136–145)
Total Bilirubin: 0.41 mg/dL (ref 0.20–1.20)
Total Protein: 7 g/dL (ref 6.4–8.3)

## 2016-01-29 MED ORDER — IOPAMIDOL (ISOVUE-370) INJECTION 76%
100.0000 mL | Freq: Once | INTRAVENOUS | Status: AC | PRN
  Administered 2016-01-29: 100 mL via INTRAVENOUS

## 2016-01-30 ENCOUNTER — Other Ambulatory Visit: Payer: Self-pay | Admitting: Nurse Practitioner

## 2016-01-30 ENCOUNTER — Telehealth: Payer: Self-pay | Admitting: Nurse Practitioner

## 2016-01-30 ENCOUNTER — Encounter: Payer: Self-pay | Admitting: Nurse Practitioner

## 2016-01-30 DIAGNOSIS — R519 Headache, unspecified: Secondary | ICD-10-CM

## 2016-01-30 DIAGNOSIS — C3492 Malignant neoplasm of unspecified part of left bronchus or lung: Secondary | ICD-10-CM

## 2016-01-30 DIAGNOSIS — R51 Headache: Secondary | ICD-10-CM

## 2016-01-30 HISTORY — DX: Headache, unspecified: R51.9

## 2016-01-30 MED ORDER — LEVOFLOXACIN 500 MG PO TABS: 500.0000 mg | ORAL_TABLET | Freq: Every day | ORAL | 0 refills | Status: DC

## 2016-01-30 NOTE — Progress Notes (Signed)
SYMPTOM MANAGEMENT CLINIC    Chief Complaint: Shortness of breath, headache  HPI:  Jason Summers 82 y.o. male diagnosed with lung cancer.  Patient is status post chemotherapy and radiation treatments. Currently undergoing observation only.   Oncology History   Patient presented to cardiology with c/o atypical CP.  Work up [place] 08/23/15 showed left lung mass.   Non-small cell cancer of left lung [facility])   Staging form: Lung, AJCC 7th Edition     Clinical stage from 09/27/2015: Stage IIIA (T3, N2, M0) - Signed by Curt Bears, MD on 09/27/2015       Non-small cell cancer of left lung ([place])   08/23/2015 Imaging    CTA Pulmonary nodular lesions on the left with adenopathy. Neoplasm is of concern. There is a tiny cavitation in the lingular nodular lesion. These findings may warrant correlation with nuclear medicine PET study to further evaluate.      09/05/2015 Imaging    PET IMPRESSION: 3.6 cm hypermetabolic left lower lobe mass, compatible with primary bronchogenic neoplasm.  Suspected 1.7 cm left upper lobe pulmonary metastasis.  Associated mediastinal nodal metastases, including a dominant 2.1 cm short axis pr      09/13/2015 Procedure    CT Biopsy IMPRESSION: Successful left lower lobe lung biopsy. Eight tiny pneumothorax resolved.      09/24/2015 Initial Diagnosis    Non-small cell cancer of left lung ([place])      10/02/2015 -  Radiation Therapy    SIM      10/03/2015 Imaging    MRI Brain  IMPRESSION: Negative for intracranial metastatic disease. No acute abnormality.      10/08/2015 -  Chemotherapy    1st Chemotherapy Carbo/Taxol       Review of Systems  Constitutional: Positive for malaise/fatigue.  HENT:       Complain of dysphagia  Gastrointestinal: Positive for nausea. Negative for vomiting.  Neurological: Positive for headaches.  All other systems reviewed and are negative.   Past Medical History:  Diagnosis Date  . Arthritis   . Cancer ([place])    basal  cell on right temple  . Hemorrhoids   . Lateral epicondylitis (tennis elbow)   . Non-small cell lung cancer (NSCLC) ([place]) dx'f 07/30/15  . Odynophagia 11/06/2015  . Spondylosis of cervical joint     Past Surgical History:  Procedure Laterality Date  . COLONOSCOPY  2003  . KNEE ARTHROSCOPY  2006   right    has HYPERCHOLESTEROLEMIA, MILD; OTHER ACUTE REACTIONS TO STRESS; HEMORRHOIDS, INTERNAL; Osteoarthritis; BURSITIS, SUBDELTOID; GANGLION OF JOINT; FACIAL FLUSHING; DYSPHAGIA, PHARYNGOESOPHAGEAL PHASE; SPRAIN AND STRAIN OF SACROILIAC; DYSPHAGIA; Pulmonary nodules; Mediastinal lymphadenopathy; Mediastinal adenopathy; Mass of lingula of lung; Non-small cell cancer of left lung ([place]); Encounter for antineoplastic chemotherapy; Odynophagia; and Headache on his problem list.    has No Known Allergies.    Medication List       Accurate as of 01/29/16 11:59 PM. Always use your most recent med list.          aspirin 81 MG EC tablet Take 81 mg by mouth every other day. Reported on 10/16/2015   AVENOVA 0.01 % Soln   fish oil-omega-3 fatty acids 1000 MG capsule Take 2 g by mouth 2 (two) times daily.   glucosamine-chondroitin 500-400 MG tablet Take 1 tablet by mouth 2 (two) times daily. Reported on 09/11/2015   ibuprofen 200 MG tablet Commonly known as:  ADVIL,MOTRIN Take 400 mg by mouth daily as needed for moderate pain. Reported  on 09/27/2015   LORazepam 0.5 MG tablet Commonly known as:  ATIVAN Take 1 tablet (0.5 mg total) by mouth 2 (two) times daily as needed for anxiety.   omega-3 acid ethyl esters 1 g capsule Commonly known as:  LOVAZA Take by mouth.   omeprazole 20 MG capsule Commonly known as:  PRILOSEC Take 20 mg by mouth as needed (heartburn).   SONAFINE EX Apply topically.        PHYSICAL EXAMINATION  Oncology Vitals 01/29/2016 01/08/2016  Height 172 cm 172 cm  Weight 77.883 kg 77.202 kg  Weight (lbs) 171 lbs 11 oz 170 lbs 3 oz  BMI (kg/m2) 26.3 kg/m2 26.07 kg/m2    Temp 98.1 97.8  Pulse 86 76  Resp 18 -  SpO2 97 97  BSA (m2) 1.93 m2 1.92 m2   BP Readings from Last 2 Encounters:  01/29/16 112/60  01/08/16 118/68    Physical Exam  Constitutional: He is oriented to person, place, and time and well-developed, well-nourished, and in no distress.  HENT:  Head: Normocephalic and atraumatic.  Mouth/Throat: Oropharynx is clear and moist.  Eyes: Conjunctivae and EOM are normal. Pupils are equal, round, and reactive to light. Right eye exhibits no discharge. Left eye exhibits no discharge. No scleral icterus.  Neck: Normal range of motion. Neck supple. No JVD present. No tracheal deviation present. No thyromegaly present.  Cardiovascular: Normal rate, regular rhythm, normal heart sounds and intact distal pulses.   Pulmonary/Chest: Effort normal and breath sounds normal. No respiratory distress. He has no wheezes. He has no rales. He exhibits no tenderness.  Abdominal: Soft. Bowel sounds are normal. He exhibits no distension and no mass. There is no tenderness. There is no rebound and no guarding.  Musculoskeletal: Normal range of motion. He exhibits no edema, tenderness or deformity.  Lymphadenopathy:    He has no cervical adenopathy.  Neurological: He is alert and oriented to person, place, and time. Gait normal.  Skin: Skin is warm and dry. No rash noted. No erythema. No pallor.  Psychiatric: Affect normal.  Nursing note and vitals reviewed.   LABORATORY DATA:. Appointment on 01/29/2016  Component Date Value Ref Range Status  . WBC 01/29/2016 9.1  4.0 - 10.3 10e3/uL Final  . NEUT# 01/29/2016 6.6* 1.5 - 6.5 10e3/uL Final  . HGB 01/29/2016 13.0  13.0 - 17.1 g/dL Final  . HCT 01/29/2016 39.2  38.4 - 49.9 % Final  . Platelets 01/29/2016 215  140 - 400 10e3/uL Final  . MCV 01/29/2016 88.9  79.3 - 98.0 fL Final  . MCH 01/29/2016 29.4  27.2 - 33.4 pg Final  . MCHC 01/29/2016 33.1  32.0 - 36.0 g/dL Final  . RBC 01/29/2016 4.41  4.20 - 5.82 10e6/uL  Final  . RDW 01/29/2016 13.2  11.0 - 14.6 % Final  . lymph# 01/29/2016 1.1  0.9 - 3.3 10e3/uL Final  . MONO# 01/29/2016 1.1* 0.1 - 0.9 10e3/uL Final  . Eosinophils Absolute 01/29/2016 0.2  0.0 - 0.5 10e3/uL Final  . Basophils Absolute 01/29/2016 0.1  0.0 - 0.1 10e3/uL Final  . NEUT% 01/29/2016 72.7  39.0 - 75.0 % Final  . LYMPH% 01/29/2016 12.1* 14.0 - 49.0 % Final  . MONO% 01/29/2016 11.6  0.0 - 14.0 % Final  . EOS% 01/29/2016 2.6  0.0 - 7.0 % Final  . BASO% 01/29/2016 1.0  0.0 - 2.0 % Final  . Sodium 01/29/2016 137  136 - 145 mEq/L Final  . Potassium 01/29/2016 4.2  3.5 -  5.1 mEq/L Final  . Chloride 01/29/2016 106  98 - 109 mEq/L Final  . CO2 01/29/2016 22  22 - 29 mEq/L Final  . Glucose 01/29/2016 109  70 - 140 mg/dl Final  . BUN 01/29/2016 17.7  7.0 - 26.0 mg/dL Final  . Creatinine 01/29/2016 0.9  0.7 - 1.3 mg/dL Final  . Total Bilirubin 01/29/2016 0.41  0.20 - 1.20 mg/dL Final  . Alkaline Phosphatase 01/29/2016 81  40 - 150 U/L Final  . AST 01/29/2016 20  5 - 34 U/L Final  . ALT 01/29/2016 14  0 - 55 U/L Final  . Total Protein 01/29/2016 7.0  6.4 - 8.3 g/dL Final  . Albumin 01/29/2016 3.0* 3.5 - 5.0 g/dL Final  . Calcium 01/29/2016 9.2  8.4 - 10.4 mg/dL Final  . Anion Gap 01/29/2016 9  3 - 11 mEq/L Final  . EGFR 01/29/2016 81* >90 ml/min/1.73 m2 Final    RADIOGRAPHIC STUDIES: Ct Angio Chest Pe W Or Wo Contrast  Result Date: 01/29/2016 CLINICAL DATA:  History of lung cancer with shortness of breath for 3 weeks. Recent completion of chemotherapy. Clinical concern for pulmonary embolus. EXAM: CT ANGIOGRAPHY CHEST WITH CONTRAST TECHNIQUE: Multidetector CT imaging of the chest was performed using the standard protocol during bolus administration of intravenous contrast. Multiplanar CT image reconstructions and MIPs were obtained to evaluate the vascular anatomy. CONTRAST:  100 cc Isovue 370 intravenously. COMPARISON:  CT of the chest 12/30/2012 FINDINGS: Mediastinum/Lymph Nodes: No  pulmonary emboli or thoracic aortic dissection identified. There is stable mediastinal lymphadenopathy with the index left prevascular lymph node measuring 11 mm, which demonstrates necrotic appearance. The heart is normal in size. There is no pericardial effusion. Atherosclerotic disease of the aorta noted. Lungs/Pleura: There is a small left pleural effusion. The dominant left lower lobe subpleural pulmonary mass now measures 1.9 x 1.1 cm. There is a stable 3 mm nodule in the lateral aspect of the left upper lobe the previously demonstrated lingular nodule is difficult to measure as it is imbedded in an area of atelectatic changes. There has been interval development of patchy areas of predominantly peribronchovascular airspace consolidation in left upper lobe, lingula, basilar segments of left lower lobe. Similar findings are seen in the dependent portions of the right lung. Upper abdomen: No acute findings.  Multiple liver cysts are noted. Musculoskeletal: No chest wall mass or suspicious bone lesions identified. Review of the MIP images confirms the above findings. IMPRESSION: No evidence of pulmonary embolus. Slight decrease in the dominant left lower lobe subpleural pulmonary mass. Interval development of small left pleural effusion and patchy predominantly peribronchovascular areas of airspace consolidation, left greater than right. These may represent areas of acute infectious consolidation such as in multifocal pneumonia. However, lymphangitic spread of disease is also a consideration. Stable mediastinal lymphadenopathy. Electronically Signed   By: Fidela Salisbury M.D.   On: 01/29/2016 15:01    ASSESSMENT/PLAN:    Non-small cell cancer of left lung [facility]) Patient is status post chemotherapy and radiation treatments in the past.  Currently undergoing observation only.  Patient presented to the cancer complaint of mild, increased shortness of breath and a positive pressure from the esophageal area  to his upper chest region.  He denies any actual chest pain, or pain with inspiration.  Patient is also noted some mild discomfort when he tries to swallow as well.  He states that he has become much worse and has a dry cough.  He denies any recent fevers or  chills.  Reviewed all findings with Dr. Julien Nordmann; and patient will undergo a CT angiogram with contrast of the chest today for further evaluation.  Also, will obtain a brain MRI for further evaluation as well.  Patient was advised to call/return or go directly to the emergency department for any worsening symptoms whatsoever.  Patient is scheduled for a follow-up visit.  At this time for both labs and a follow-up visit.  03/05/2016  Headache Patient states he has been awakening each morning with headache for the past few months.  He states that headaches or steadily becoming more severe.  He denies any light sensitivity; and  occasionally feels nauseous.  He denies any other neurological issues whatsoever.  Exam today reveals neurologically intact.  Will order a brain MRI for further evaluation.  Patient was advised to go directly to the emergency department for any worsening symptoms whatsoever.    DYSPHAGIA Patient states that he has had some pressure to his esophageal area that radiates to his upper chest; and also some mild increased hoarseness and dry cough for the past several days.  He also states that he's had some mild discomfort when he swallows as well.  He continues to manage his oral secretions with difficulty.  Patient will undergo a CT angiogram of the chest for further evaluation today.   Patient stated understanding of all instructions; and was in agreement with this plan of care. The patient knows to call the clinic with any problems, questions or concerns.   Total time spent with patient was 40 minutes;  with greater than 75 percent of that time spent in face to face counseling regarding patient's symptoms,  and  coordination of care and follow up.  Disclaimer:This dictation was prepared with Dragon/digital dictation along with [employer]. Any transcriptional errors that result from this process are unintentional.  Drue Second, NP 01/30/2016

## 2016-01-30 NOTE — Assessment & Plan Note (Signed)
Patient states that he has had some pressure to his esophageal area that radiates to his upper chest; and also some mild increased hoarseness and dry cough for the past several days.  He also states that he's had some mild discomfort when he swallows as well.  He continues to manage his oral secretions with difficulty.  Patient will undergo a CT angiogram of the chest for further evaluation today.

## 2016-01-30 NOTE — Assessment & Plan Note (Signed)
Patient is status post chemotherapy and radiation treatments in the past.  Currently undergoing observation only.  Patient presented to the cancer complaint of mild, increased shortness of breath and a positive pressure from the esophageal area to his upper chest region.  He denies any actual chest pain, or pain with inspiration.  Patient is also noted some mild discomfort when he tries to swallow as well.  He states that he has become much worse and has a dry cough.  He denies any recent fevers or chills.  Reviewed all findings with Dr. Julien Nordmann; and patient will undergo a CT angiogram with contrast of the chest today for further evaluation.  Also, will obtain a brain MRI for further evaluation as well.  Patient was advised to call/return or go directly to the emergency department for any worsening symptoms whatsoever.  Patient is scheduled for a follow-up visit.  At this time for both labs and a follow-up visit.  03/05/2016

## 2016-01-30 NOTE — Assessment & Plan Note (Signed)
Patient states he has been awakening each morning with headache for the past few months.  He states that headaches or steadily becoming more severe.  He denies any light sensitivity; and  occasionally feels nauseous.  He denies any other neurological issues whatsoever.  Exam today reveals neurologically intact.  Will order a brain MRI for further evaluation.  Patient was advised to go directly to the emergency department for any worsening symptoms whatsoever.

## 2016-01-30 NOTE — Telephone Encounter (Signed)
Called and spoke to patient's wife regarding the CT angiogram of the chest was obtained yesterday.  Reviewed all findings of the CT chest with Dr. Julien Nordmann; and then reviewed all with patient's wife as well.  Per Dr. Julien Nordmann.  Recommendations-patient will be prescribed Levaquin antibiotics for any possible mild pneumonia.  Also, patient is scheduled for a brain MRI on 02/01/2016.  Patient was advised to call/return or go directly to the emergency department for any worsening symptoms whatsoever.

## 2016-02-01 ENCOUNTER — Other Ambulatory Visit: Payer: Self-pay | Admitting: Nurse Practitioner

## 2016-02-01 ENCOUNTER — Telehealth: Payer: Self-pay | Admitting: Nurse Practitioner

## 2016-02-01 ENCOUNTER — Ambulatory Visit (HOSPITAL_COMMUNITY)
Admission: RE | Admit: 2016-02-01 | Discharge: 2016-02-01 | Disposition: A | Discharge status: Home / Self Care | Payer: Medicare Other | Source: Ambulatory Visit | Attending: Nurse Practitioner | Admitting: Nurse Practitioner

## 2016-02-01 DIAGNOSIS — R51 Headache: Secondary | ICD-10-CM | POA: Insufficient documentation

## 2016-02-01 DIAGNOSIS — C3492 Malignant neoplasm of unspecified part of left bronchus or lung: Secondary | ICD-10-CM | POA: Diagnosis not present

## 2016-02-01 DIAGNOSIS — R519 Headache, unspecified: Secondary | ICD-10-CM

## 2016-02-01 DIAGNOSIS — R9089 Other abnormal findings on diagnostic imaging of central nervous system: Secondary | ICD-10-CM | POA: Diagnosis not present

## 2016-02-01 MED ORDER — DEXAMETHASONE 4 MG PO TABS: 4.0000 mg | ORAL_TABLET | Freq: Two times a day (BID) | ORAL | 0 refills | Status: DC

## 2016-02-01 MED ORDER — GADOBENATE DIMEGLUMINE 529 MG/ML IV SOLN
20.0000 mL | Freq: Once | INTRAVENOUS | Status: AC | PRN
  Administered 2016-02-01: 16 mL via INTRAVENOUS

## 2016-02-01 NOTE — Telephone Encounter (Signed)
Called and spoke with patient's wife; since patient has some difficulty understanding [place].  Reviewed brain MRI results obtained earlier today with wife:   IMPRESSION: Punctate focus of abnormal enhancement in the left posterior parietal white matter worrisome for a punctate metastasis. This was present on the previous study and has not enlarged or developed Edema.  Will prescribe dexamethasone 4 mg to take twice daily.  Also, will call patient back on Monday, 02/04/2016 to check in with him.  Will review all once again with Dr. Julien Nordmann at that time as well.  In the meantime-patient was advised to go directly to the emergency department over the weekend with any worsening symptoms whatsoever.  Patient's wife stated full understanding of all instructions and was in agreement with this plan of care.

## 2016-02-04 ENCOUNTER — Telehealth: Payer: Self-pay

## 2016-02-04 NOTE — Telephone Encounter (Signed)
Called to see how pt was doing after last weeks visit.  Pt and his wife both stated that he was feeling much better with the dex.  Told them both that if s/s return to please call and we would get him in to be seen.  Both pt and wife verbalized understanding

## 2016-02-06 ENCOUNTER — Telehealth: Payer: Self-pay | Admitting: *Deleted

## 2016-02-06 NOTE — Telephone Encounter (Signed)
"  Spouse Mickel Baas calling for [facility]  He was seen but we have more questions and questions about the MRI."  Call transferred ext [phone].

## 2016-02-07 ENCOUNTER — Telehealth: Payer: Self-pay | Admitting: Nurse Practitioner

## 2016-02-10 ENCOUNTER — Telehealth: Payer: Self-pay | Admitting: Nurse Practitioner

## 2016-02-10 NOTE — Telephone Encounter (Signed)
error

## 2016-02-10 NOTE — Telephone Encounter (Signed)
Late Entry 02/07/16:  Called and reviewed all of the recent radiology results with pt's wife again. She stated that her husband's headaches have resolved since he is taking the dexamethasone.   All questions were answered; and pt is scheduled for follow up with Dr. Julien Nordmann in few weeks.   Pt and his wife understand to call/return or go directly to the ED for any worsening symptoms whatsoever.

## 2016-03-05 ENCOUNTER — Other Ambulatory Visit (HOSPITAL_BASED_OUTPATIENT_CLINIC_OR_DEPARTMENT_OTHER): Payer: Medicare Other

## 2016-03-05 ENCOUNTER — Ambulatory Visit (HOSPITAL_BASED_OUTPATIENT_CLINIC_OR_DEPARTMENT_OTHER): Payer: Medicare Other | Admitting: Internal Medicine

## 2016-03-05 ENCOUNTER — Encounter: Payer: Self-pay | Admitting: Internal Medicine

## 2016-03-05 VITALS — BP 114/64 | HR 81 | Temp 98.2°F | Resp 20 | Ht 67.75 in | Wt 172.6 lb

## 2016-03-05 DIAGNOSIS — C3432 Malignant neoplasm of lower lobe, left bronchus or lung: Secondary | ICD-10-CM

## 2016-03-05 DIAGNOSIS — R630 Anorexia: Secondary | ICD-10-CM

## 2016-03-05 DIAGNOSIS — C3492 Malignant neoplasm of unspecified part of left bronchus or lung: Secondary | ICD-10-CM

## 2016-03-05 DIAGNOSIS — R531 Weakness: Secondary | ICD-10-CM

## 2016-03-05 DIAGNOSIS — R05 Cough: Secondary | ICD-10-CM | POA: Diagnosis not present

## 2016-03-05 DIAGNOSIS — R0609 Other forms of dyspnea: Secondary | ICD-10-CM | POA: Diagnosis not present

## 2016-03-05 LAB — COMPREHENSIVE METABOLIC PANEL
ALT: 21 U/L (ref 0–55)
ANION GAP: 11 meq/L (ref 3–11)
AST: 19 U/L (ref 5–34)
Albumin: 2.9 g/dL — ABNORMAL LOW (ref 3.5–5.0)
Alkaline Phosphatase: 84 U/L (ref 40–150)
BILIRUBIN TOTAL: 0.4 mg/dL (ref 0.20–1.20)
BUN: 20.4 mg/dL (ref 7.0–26.0)
CALCIUM: 9 mg/dL (ref 8.4–10.4)
CHLORIDE: 105 meq/L (ref 98–109)
CO2: 22 mEq/L (ref 22–29)
CREATININE: 1 mg/dL (ref 0.7–1.3)
EGFR: 67 mL/min/{1.73_m2} — ABNORMAL LOW (ref >90)
Glucose: 103 mg/dl (ref 70–140)
Potassium: 4.3 mEq/L (ref 3.5–5.1)
Sodium: 138 mEq/L (ref 136–145)
TOTAL PROTEIN: 6.7 g/dL (ref 6.4–8.3)

## 2016-03-05 LAB — CBC WITH DIFFERENTIAL/PLATELET
BASO%: 0.5 % (ref 0.0–2.0)
BASOS ABS: 0 10*3/uL (ref 0.0–0.1)
EOS ABS: 0.2 10*3/uL (ref 0.0–0.5)
EOS%: 3.1 % (ref 0.0–7.0)
HEMATOCRIT: 39.1 % (ref 38.4–49.9)
HEMOGLOBIN: 12.9 g/dL — AB (ref 13.0–17.1)
LYMPH%: 14.2 % (ref 14.0–49.0)
MCH: 28.5 pg (ref 27.2–33.4)
MCHC: 33 g/dL (ref 32.0–36.0)
MCV: 86.2 fL (ref 79.3–98.0)
MONO#: 0.8 10*3/uL (ref 0.1–0.9)
MONO%: 11.4 % (ref 0.0–14.0)
NEUT%: 70.8 % (ref 39.0–75.0)
NEUTROS ABS: 5 10*3/uL (ref 1.5–6.5)
PLATELETS: 219 10*3/uL (ref 140–400)
RBC: 4.53 10*6/uL (ref 4.20–5.82)
RDW: 13.6 % (ref 11.0–14.6)
WBC: 7.1 10*3/uL (ref 4.0–10.3)
lymph#: 1 10*3/uL (ref 0.9–3.3)

## 2016-03-05 MED ORDER — METHYLPREDNISOLONE 4 MG PO TBPK: ORAL_TABLET | ORAL | 0 refills | Status: DC

## 2016-03-05 NOTE — Progress Notes (Signed)
[place] [phone]   [phone]  OFFICE PROGRESS NOTE  Jason Cowden, MD [place] [address]  DIAGNOSIS: Stage IIIA (T3, N2, M0) non-small cell lung cancer, poorly differentiated squamous cell carcinoma diagnosed in February 2017 with PDL 1 expression of 35%, presented with large left lower lobe lung mass in addition to left upper lobe lung nodule and mediastinal lymphadenopathy.  PRIOR THERAPY: Course of concurrent chemoradiation with weekly carboplatin for AUC of 2 and paclitaxel 45 MG/M2. Status post 7 cycles, last dose was given 11/19/2015 with partial response.  CURRENT THERAPY: Observation.  INTERVAL HISTORY: Jason Summers 82 y.o. male returns to the clinic today for follow-up visit accompanied by his wife. He has been observation for the last few months. The patient continues to complain of increasing fatigue and weakness as well as shortness breath with exertion. He was seen last month at the symptom management clinic and CT scan of the chest at that time showed questionable inflammatory process in the left lung questionable for multifocal pneumonia. The patient was treated with a course of antibiotics and has some improvement but not complete improvement. He also had MRI of the brain performed last month that showed no significant change on the previous punctate lesion seen on the previous MRI 4 months before. He denied having any current fever or chills. He has no nausea or vomiting. He has no chest pain but continues to have the shortness of breath with mild cough and no hemoptysis. He is here today for evaluation and discussion of his condition and treatment options.  MEDICAL HISTORY: Past Medical History:  Diagnosis Date  . Arthritis   . Cancer ([place])    basal cell on right temple  . Hemorrhoids   . Lateral epicondylitis (tennis elbow)   . Non-small cell lung cancer (NSCLC) ([place]) dx'f 07/30/15  .  Odynophagia 11/06/2015  . Spondylosis of cervical joint     ALLERGIES:  has No Known Allergies.  MEDICATIONS:  Current Outpatient Prescriptions  Medication Sig Dispense Refill  . aspirin 81 MG EC tablet Take 81 mg by mouth every other day. Reported on 10/16/2015    . dexamethasone (DECADRON) 4 MG tablet Take 1 tablet (4 mg total) by mouth 2 (two) times daily. 20 tablet 0  . Eyelid Cleansers (AVENOVA) 0.01 % SOLN     . fish oil-omega-3 fatty acids 1000 MG capsule Take 2 g by mouth 2 (two) times daily.      Marland Kitchen glucosamine-chondroitin 500-400 MG tablet Take 1 tablet by mouth 2 (two) times daily. Reported on 09/11/2015    . ibuprofen (ADVIL,MOTRIN) 200 MG tablet Take 400 mg by mouth daily as needed for moderate pain. Reported on 09/27/2015    . levofloxacin (LEVAQUIN) 500 MG tablet Take 1 tablet (500 mg total) by mouth daily. 7 tablet 0  . LORazepam (ATIVAN) 0.5 MG tablet Take 1 tablet (0.5 mg total) by mouth 2 (two) times daily as needed for anxiety. (Patient not taking: Reported on 01/29/2016) 30 tablet 2  . omega-3 acid ethyl esters (LOVAZA) 1 g capsule Take by mouth.    Marland Kitchen omeprazole (PRILOSEC) 20 MG capsule Take 20 mg by mouth as needed (heartburn).     . Wound Dressings (SONAFINE EX) Apply topically.     No current facility-administered medications for this visit.     SURGICAL HISTORY:  Past Surgical History:  Procedure Laterality Date  . COLONOSCOPY  2003  . KNEE ARTHROSCOPY  2006   right    REVIEW OF SYSTEMS:  Constitutional: positive for fatigue Eyes: negative Ears, nose, mouth, throat, and face: negative Respiratory: positive for cough and dyspnea on exertion Cardiovascular: negative Gastrointestinal: negative Genitourinary:negative Integument/breast: negative Hematologic/lymphatic: negative Musculoskeletal:negative Neurological: negative Behavioral/Psych: negative Endocrine: negative Allergic/Immunologic: negative   PHYSICAL EXAMINATION: General appearance: alert,  cooperative, fatigued and no distress Head: Normocephalic, without obvious abnormality, atraumatic Neck: no adenopathy, no JVD, supple, symmetrical, trachea midline and thyroid not enlarged, symmetric, no tenderness/mass/nodules Lymph nodes: Cervical, supraclavicular, and axillary nodes normal. Resp: clear to auscultation bilaterally Back: symmetric, no curvature. ROM normal. No CVA tenderness. Cardio: regular rate and rhythm, S1, S2 normal, no murmur, click, rub or gallop GI: soft, non-tender; bowel sounds normal; no masses,  no organomegaly Extremities: extremities normal, atraumatic, no cyanosis or edema Neurologic: Alert and oriented X 3, normal strength and tone. Normal symmetric reflexes. Normal coordination and gait  ECOG PERFORMANCE STATUS: 1 - Symptomatic but completely ambulatory  There were no vitals taken for this visit.  LABORATORY DATA: Lab Results  Component Value Date   WBC 7.1 03/05/2016   HGB 12.9 (L) 03/05/2016   HCT 39.1 03/05/2016   MCV 86.2 03/05/2016   PLT 219 03/05/2016      Chemistry      Component Value Date/Time   NA 138 03/05/2016 1244   K 4.3 03/05/2016 1244   CL 103 08/22/2015 1443   CO2 22 03/05/2016 1244   BUN 20.4 03/05/2016 1244   CREATININE 1.0 03/05/2016 1244      Component Value Date/Time   CALCIUM 9.0 03/05/2016 1244   ALKPHOS 84 03/05/2016 1244   AST 19 03/05/2016 1244   ALT 21 03/05/2016 1244   BILITOT 0.40 03/05/2016 1244       RADIOGRAPHIC STUDIES: No results found.  ASSESSMENT AND PLAN: This is a very pleasant 82 years old white male recently diagnosed with a stage IIIa non-small cell lung cancer, squamous cell carcinoma and currently undergoing a course of concurrent chemoradiation with weekly carboplatin and paclitaxel status post 7 cycles. He declined consolidation chemotherapy at that time because he was not feeling well and would like to continue his on observation. His most recent CT scan of the chest last month showed  no evidence for disease progression. I had a lengthy discussion with the patient and his wife about his condition. I recommended for him to continue on observation with repeat CT scan of the chest in 3 months. For the lack of appetite and shortness of breath, I started the patient on Medrol Dosepak. For the deconditioning, I strongly encouraged the patient to start exercising gradually at regular basis. The patient was advised to call immediately if he has any concerning symptoms in the interval. The patient voices understanding of current disease status and treatment options and is in agreement with the current care plan.  All questions were answered. The patient knows to call the clinic with any problems, questions or concerns. We can certainly see the patient much sooner if necessary.  Disclaimer: This note was dictated with voice recognition software. Similar sounding words can inadvertently be transcribed and may not be corrected upon review.

## 2016-04-24 ENCOUNTER — Encounter: Payer: Self-pay | Admitting: Internal Medicine

## 2016-04-24 ENCOUNTER — Telehealth: Payer: Self-pay | Admitting: Internal Medicine

## 2016-04-24 ENCOUNTER — Ambulatory Visit (HOSPITAL_BASED_OUTPATIENT_CLINIC_OR_DEPARTMENT_OTHER): Payer: Medicare Other | Admitting: Internal Medicine

## 2016-04-24 ENCOUNTER — Other Ambulatory Visit (HOSPITAL_BASED_OUTPATIENT_CLINIC_OR_DEPARTMENT_OTHER): Payer: Medicare Other

## 2016-04-24 VITALS — BP 118/65 | HR 80 | Temp 98.1°F | Resp 18 | Ht 67.75 in | Wt 174.3 lb

## 2016-04-24 DIAGNOSIS — R05 Cough: Secondary | ICD-10-CM | POA: Diagnosis not present

## 2016-04-24 DIAGNOSIS — C3432 Malignant neoplasm of lower lobe, left bronchus or lung: Secondary | ICD-10-CM

## 2016-04-24 DIAGNOSIS — R1314 Dysphagia, pharyngoesophageal phase: Secondary | ICD-10-CM

## 2016-04-24 DIAGNOSIS — R609 Edema, unspecified: Secondary | ICD-10-CM

## 2016-04-24 DIAGNOSIS — C3492 Malignant neoplasm of unspecified part of left bronchus or lung: Secondary | ICD-10-CM

## 2016-04-24 DIAGNOSIS — R1310 Dysphagia, unspecified: Secondary | ICD-10-CM | POA: Diagnosis not present

## 2016-04-24 LAB — COMPREHENSIVE METABOLIC PANEL
ALT: 19 U/L (ref 0–55)
AST: 19 U/L (ref 5–34)
Albumin: 3.4 g/dL — ABNORMAL LOW (ref 3.5–5.0)
Alkaline Phosphatase: 84 U/L (ref 40–150)
Anion Gap: 10 mEq/L (ref 3–11)
BUN: 16.1 mg/dL (ref 7.0–26.0)
CHLORIDE: 106 meq/L (ref 98–109)
CO2: 21 meq/L — AB (ref 22–29)
CREATININE: 0.8 mg/dL (ref 0.7–1.3)
Calcium: 9.2 mg/dL (ref 8.4–10.4)
EGFR: 82 mL/min/{1.73_m2} — ABNORMAL LOW (ref >90)
GLUCOSE: 99 mg/dL (ref 70–140)
Potassium: 4.3 mEq/L (ref 3.5–5.1)
SODIUM: 137 meq/L (ref 136–145)
Total Bilirubin: 0.49 mg/dL (ref 0.20–1.20)
Total Protein: 7.2 g/dL (ref 6.4–8.3)

## 2016-04-24 LAB — CBC WITH DIFFERENTIAL/PLATELET
BASO%: 0.5 % (ref 0.0–2.0)
Basophils Absolute: 0 10*3/uL (ref 0.0–0.1)
EOS%: 1.8 % (ref 0.0–7.0)
Eosinophils Absolute: 0.1 10*3/uL (ref 0.0–0.5)
HEMATOCRIT: 40.4 % (ref 38.4–49.9)
HGB: 13.4 g/dL (ref 13.0–17.1)
LYMPH#: 1 10*3/uL (ref 0.9–3.3)
LYMPH%: 13.4 % — ABNORMAL LOW (ref 14.0–49.0)
MCH: 28.2 pg (ref 27.2–33.4)
MCHC: 33.2 g/dL (ref 32.0–36.0)
MCV: 85 fL (ref 79.3–98.0)
MONO#: 0.9 10*3/uL (ref 0.1–0.9)
MONO%: 11.5 % (ref 0.0–14.0)
NEUT#: 5.7 10*3/uL (ref 1.5–6.5)
NEUT%: 72.8 % (ref 39.0–75.0)
Platelets: 199 10*3/uL (ref 140–400)
RBC: 4.75 10*6/uL (ref 4.20–5.82)
RDW: 15.8 % — ABNORMAL HIGH (ref 11.0–14.6)
WBC: 7.8 10*3/uL (ref 4.0–10.3)

## 2016-04-24 NOTE — Progress Notes (Signed)
[place] [phone]   Fax:([phone]) [phone]  OFFICE PROGRESS NOTE  [name], MD [place] [address]  DIAGNOSIS: Stage IIIA (T3, N2, M0) non-small cell lung cancer, poorly differentiated squamous cell carcinoma diagnosed in February 2017 with PDL 1 expression of 35%, presented with large left lower lobe lung mass in addition to left upper lobe lung nodule and mediastinal lymphadenopathy.  PRIOR THERAPY: Course of concurrent chemoradiation with weekly carboplatin for AUC of 2 and paclitaxel 45 MG/M2. Status post 7 cycles, last dose was given 11/19/2015 with partial response.  CURRENT THERAPY: Observation.  INTERVAL HISTORY: Jason Summers 82 y.o. male returns to the clinic today for follow-up visit accompanied by his wife. The patient is feeling fine today except for dysphagia to solid food as well as cough. These are most likely secondary to radiation induced esophagitis as well as pneumonitis. He is better compared to several weeks ago but continues to have these complaints. He denied having any chest pain but has shortness breath with exertion with no hemoptysis. He denied having any significant weight loss or night sweats. He had previous esophageal dilatation before his diagnosis of lung cancer by Dr. Deatra Ina. He is here today for evaluation and repeat blood work.  MEDICAL HISTORY: Past Medical History:  Diagnosis Date  . Arthritis   . Cancer ([place])    basal cell on right temple  . Hemorrhoids   . Lateral epicondylitis (tennis elbow)   . Non-small cell lung cancer (NSCLC) ([place]) dx'f 07/30/15  . Odynophagia 11/06/2015  . Spondylosis of cervical joint     ALLERGIES:  has No Known Allergies.  MEDICATIONS:  Current Outpatient Prescriptions  Medication Sig Dispense Refill  . aspirin 81 MG EC tablet Take 81 mg by mouth every other day. Reported on 10/16/2015    . dexamethasone (DECADRON) 4 MG tablet Take 1 tablet (4 mg  total) by mouth 2 (two) times daily. 20 tablet 0  . Eyelid Cleansers (AVENOVA) 0.01 % SOLN     . fish oil-omega-3 fatty acids 1000 MG capsule Take 2 g by mouth 2 (two) times daily.      Marland Kitchen glucosamine-chondroitin 500-400 MG tablet Take 1 tablet by mouth 2 (two) times daily. Reported on 09/11/2015    . ibuprofen (ADVIL,MOTRIN) 200 MG tablet Take 400 mg by mouth daily as needed for moderate pain. Reported on 09/27/2015    . levofloxacin (LEVAQUIN) 500 MG tablet Take 1 tablet (500 mg total) by mouth daily. 7 tablet 0  . LORazepam (ATIVAN) 0.5 MG tablet Take 1 tablet (0.5 mg total) by mouth 2 (two) times daily as needed for anxiety. (Patient not taking: Reported on 01/29/2016) 30 tablet 2  . methylPREDNISolone (MEDROL DOSEPAK) 4 MG TBPK tablet Use as instructed 21 tablet 0  . omega-3 acid ethyl esters (LOVAZA) 1 g capsule Take by mouth.    Marland Kitchen omeprazole (PRILOSEC) 20 MG capsule Take 20 mg by mouth as needed (heartburn).     . Wound Dressings (SONAFINE EX) Apply topically.     No current facility-administered medications for this visit.     SURGICAL HISTORY:  Past Surgical History:  Procedure Laterality Date  . COLONOSCOPY  2003  . KNEE ARTHROSCOPY  2006   right    REVIEW OF SYSTEMS:  A comprehensive review of systems was negative except for: Constitutional: positive for fatigue Respiratory: positive for cough Gastrointestinal: positive for dysphagia   PHYSICAL EXAMINATION: General appearance: alert, cooperative, fatigued and  no distress Head: Normocephalic, without obvious abnormality, atraumatic Neck: no adenopathy, no JVD, supple, symmetrical, trachea midline and thyroid not enlarged, symmetric, no tenderness/mass/nodules Lymph nodes: Cervical, supraclavicular, and axillary nodes normal. Resp: clear to auscultation bilaterally Back: symmetric, no curvature. ROM normal. No CVA tenderness. Cardio: regular rate and rhythm, S1, S2 normal, no murmur, click, rub or gallop GI: soft, non-tender;  bowel sounds normal; no masses,  no organomegaly Extremities: extremities normal, atraumatic, no cyanosis or edema Neurologic: Alert and oriented X 3, normal strength and tone. Normal symmetric reflexes. Normal coordination and gait  ECOG PERFORMANCE STATUS: 1 - Symptomatic but completely ambulatory  Blood pressure 118/65, pulse 80, temperature 98.1 F (36.7 C), temperature source Oral, resp. rate 18, height 5' 7.75" (1.721 m), weight 174 lb 4.8 oz (79.1 kg), SpO2 95 %.  LABORATORY DATA: Lab Results  Component Value Date   WBC 7.8 04/24/2016   HGB 13.4 04/24/2016   HCT 40.4 04/24/2016   MCV 85.0 04/24/2016   PLT 199 04/24/2016      Chemistry      Component Value Date/Time   NA 137 04/24/2016 1019   K 4.3 04/24/2016 1019   CL 103 08/22/2015 1443   CO2 21 (L) 04/24/2016 1019   BUN 16.1 04/24/2016 1019   CREATININE 0.8 04/24/2016 1019      Component Value Date/Time   CALCIUM 9.2 04/24/2016 1019   ALKPHOS 84 04/24/2016 1019   AST 19 04/24/2016 1019   ALT 19 04/24/2016 1019   BILITOT 0.49 04/24/2016 1019       RADIOGRAPHIC STUDIES: No results found.  ASSESSMENT AND PLAN: This is a very pleasant 82 years old white male recently diagnosed with a stage IIIa non-small cell lung cancer, squamous cell carcinoma and currently undergoing a course of concurrent chemoradiation with weekly carboplatin and paclitaxel status post 7 cycles. He is feeling fine today except for the dysphagia and cough. I recommended for the patient to have repeat CT scan of the chest in the next 1-2 weeks for restaging of his disease. For the dysphagia, I will refer the patient to gastroenterology for evaluation and consideration of dilatation of his esophagus. I will see him back for follow-up visit in 2 weeks. For the deconditioning, I strongly encouraged the patient to start exercising gradually at regular basis. The patient was advised to call immediately if he has any concerning symptoms in the  interval. The patient voices understanding of current disease status and treatment options and is in agreement with the current care plan.  All questions were answered. The patient knows to call the clinic with any problems, questions or concerns. We can certainly see the patient much sooner if necessary.  Disclaimer: This note was dictated with voice recognition software. Similar sounding words can inadvertently be transcribed and may not be corrected upon review.

## 2016-04-24 NOTE — Telephone Encounter (Signed)
Gave patient avs report and appointments for November. Patient also given appointment with Alonza Bogus, [place] at [facility] 11/21. Central radiology will call re scan due 11/28.

## 2016-04-28 ENCOUNTER — Telehealth: Payer: Self-pay | Admitting: Medical Oncology

## 2016-04-28 NOTE — Telephone Encounter (Signed)
Cough-he is not taking anything for it and it is exhausting him. Per Julien Nordmann I told wife to try OTC Delysm and to call back tomorrow if not effective and he will call in something stronger.

## 2016-04-29 ENCOUNTER — Ambulatory Visit (INDEPENDENT_AMBULATORY_CARE_PROVIDER_SITE_OTHER): Payer: Medicare Other | Admitting: Gastroenterology

## 2016-04-29 ENCOUNTER — Encounter: Payer: Self-pay | Admitting: Gastroenterology

## 2016-04-29 VITALS — BP 110/70 | HR 100 | Ht 67.75 in | Wt 173.8 lb

## 2016-04-29 DIAGNOSIS — R1310 Dysphagia, unspecified: Secondary | ICD-10-CM | POA: Diagnosis not present

## 2016-04-29 DIAGNOSIS — R112 Nausea with vomiting, unspecified: Secondary | ICD-10-CM

## 2016-04-29 DIAGNOSIS — K219 Gastro-esophageal reflux disease without esophagitis: Secondary | ICD-10-CM | POA: Diagnosis not present

## 2016-04-29 DIAGNOSIS — Z8719 Personal history of other diseases of the digestive system: Secondary | ICD-10-CM

## 2016-04-29 MED ORDER — ESOMEPRAZOLE MAGNESIUM 40 MG PO CPDR: 40.0000 mg | DELAYED_RELEASE_CAPSULE | Freq: Every day | ORAL | 3 refills | Status: DC

## 2016-04-29 NOTE — Patient Instructions (Signed)
If you are age 80 or older, your body mass index should be between 23-30. Your Body mass index is 26.62 kg/m. If this is out of the aforementioned range listed, please consider follow up with your Primary Care Provider.  If you are age 49 or younger, your body mass index should be between 19-25. Your Body mass index is 26.62 kg/m. If this is out of the aformentioned range listed, please consider follow up with your Primary Care Provider.   We have sent the following medications to your pharmacy for you to pick up at your convenience:   Nexium 43m daily  You have been [facility]1st floor of the hospital) on Friday December 1st at 11:00am. Please arrive 15 minutes prior to your appointment for registration. Make certain not to have anything to eat or drink 3 hours prior to your test. If you need to reschedule for any reason, please contact radiology at [phone] do so. __________________________________________________________________ A barium swallow is an examination that concentrates on views of the esophagus. This tends to be a double contrast exam (barium and two liquids which, when combined, create a gas to distend the wall of the oesophagus) or single contrast (non-ionic iodine based). The study is usually tailored to your symptoms so a good history is essential. Attention is paid during the study to the form, structure and configuration of the esophagus, looking for functional disorders (such as aspiration, dysphagia, achalasia, motility and reflux) EXAMINATION You may be asked to change into a gown, depending on the type of swallow being performed. A radiologist and radiographer will perform the procedure. The radiologist will advise you of the type of contrast selected for your procedure and direct you during the exam. You will be asked to stand, sit or lie in several different positions and to hold a small amount of fluid in your mouth  before being asked to swallow while the imaging is performed .In some instances you may be asked to swallow barium coated marshmallows to assess the motility of a solid food bolus. The exam can be recorded as a digital or video fluoroscopy procedure. POST PROCEDURE It will take 1-2 days for the barium to pass through your system. To facilitate this, it is important, unless otherwise directed, to increase your fluids for the next 24-48hrs and to resume your normal diet.  This test typically takes about 30 minutes to perform.You have been scheduled for an endoscopy. Please follow written instructions given to you at your visit today. If you use inhalers (even only as needed), please bring them with you on the day of your procedure. Your physician has requested that you go to www.[email or web address].com and enter the access code given to you at your visit today. This web site gives a general overview about your procedure. However, you should still follow specific instructions given to you by our office regarding your preparation for the procedure.   You have been scheduled for an endoscopy. Please follow written instructions given to you at your visit today. If you use inhalers (even only as needed), please bring them with you on the day of your procedure. Your physician has requested that you go to www.[email or web address] and enter the access code given to you at your visit today. This web site gives a general overview about your procedure. However, you should still follow specific instructions given to you by our office regarding your preparation for the procedure.  __________________________________________________________________________________

## 2016-05-05 ENCOUNTER — Encounter (HOSPITAL_COMMUNITY): Payer: Self-pay

## 2016-05-05 ENCOUNTER — Ambulatory Visit (HOSPITAL_COMMUNITY)
Admission: RE | Admit: 2016-05-05 | Discharge: 2016-05-05 | Disposition: A | Discharge status: Home / Self Care | Payer: Medicare Other | Source: Ambulatory Visit | Attending: Internal Medicine | Admitting: Internal Medicine

## 2016-05-05 DIAGNOSIS — R918 Other nonspecific abnormal finding of lung field: Secondary | ICD-10-CM | POA: Insufficient documentation

## 2016-05-05 DIAGNOSIS — I700 Atherosclerosis of aorta: Secondary | ICD-10-CM | POA: Insufficient documentation

## 2016-05-05 DIAGNOSIS — J90 Pleural effusion, not elsewhere classified: Secondary | ICD-10-CM | POA: Diagnosis not present

## 2016-05-05 DIAGNOSIS — C3492 Malignant neoplasm of unspecified part of left bronchus or lung: Secondary | ICD-10-CM | POA: Diagnosis not present

## 2016-05-05 DIAGNOSIS — I2510 Atherosclerotic heart disease of native coronary artery without angina pectoris: Secondary | ICD-10-CM | POA: Diagnosis not present

## 2016-05-05 MED ORDER — IOPAMIDOL (ISOVUE-300) INJECTION 61%
75.0000 mL | Freq: Once | INTRAVENOUS | Status: AC | PRN
  Administered 2016-05-05: 75 mL via INTRAVENOUS

## 2016-05-07 ENCOUNTER — Ambulatory Visit (HOSPITAL_BASED_OUTPATIENT_CLINIC_OR_DEPARTMENT_OTHER): Payer: Medicare Other | Admitting: Internal Medicine

## 2016-05-07 ENCOUNTER — Encounter: Payer: Self-pay | Admitting: Internal Medicine

## 2016-05-07 ENCOUNTER — Telehealth: Payer: Self-pay | Admitting: Internal Medicine

## 2016-05-07 ENCOUNTER — Other Ambulatory Visit (HOSPITAL_BASED_OUTPATIENT_CLINIC_OR_DEPARTMENT_OTHER): Payer: Medicare Other | Admitting: Medical Oncology

## 2016-05-07 VITALS — BP 133/69 | HR 86 | Temp 98.2°F | Resp 18 | Wt 174.3 lb

## 2016-05-07 DIAGNOSIS — Z23 Encounter for immunization: Secondary | ICD-10-CM | POA: Diagnosis not present

## 2016-05-07 DIAGNOSIS — R1310 Dysphagia, unspecified: Secondary | ICD-10-CM

## 2016-05-07 DIAGNOSIS — R0609 Other forms of dyspnea: Secondary | ICD-10-CM | POA: Diagnosis not present

## 2016-05-07 DIAGNOSIS — C3492 Malignant neoplasm of unspecified part of left bronchus or lung: Secondary | ICD-10-CM

## 2016-05-07 DIAGNOSIS — R05 Cough: Secondary | ICD-10-CM

## 2016-05-07 DIAGNOSIS — J700 Acute pulmonary manifestations due to radiation: Secondary | ICD-10-CM | POA: Diagnosis not present

## 2016-05-07 DIAGNOSIS — C3432 Malignant neoplasm of lower lobe, left bronchus or lung: Secondary | ICD-10-CM | POA: Diagnosis not present

## 2016-05-07 MED ORDER — INFLUENZA VAC SPLIT QUAD 0.5 ML IM SUSY
0.5000 mL | PREFILLED_SYRINGE | Freq: Once | INTRAMUSCULAR | Status: AC
  Administered 2016-05-07: 0.5 mL via INTRAMUSCULAR
  Filled 2016-05-07: qty 0.5

## 2016-05-07 MED ORDER — PREDNISONE 10 MG (21) PO TBPK: ORAL_TABLET | ORAL | 0 refills | Status: DC

## 2016-05-07 NOTE — Progress Notes (Signed)
[place] Telephone:([phone]   Fax:(336) [phone]  OFFICE PROGRESS NOTE  Jason Cowden, MD [place] [address]  DIAGNOSIS: Stage IIIA (T3, N2, M0) non-small cell lung cancer, poorly differentiated squamous cell carcinoma diagnosed in February 2017 with PDL 1 expression of 35%, presented with large left lower lobe lung mass in addition to left upper lobe lung nodule and mediastinal lymphadenopathy.  PRIOR THERAPY: Course of concurrent chemoradiation with weekly carboplatin for AUC of 2 and paclitaxel 45 MG/M2. Status post 7 cycles, last dose was given 11/19/2015 with partial response.  CURRENT THERAPY: Observation.  INTERVAL HISTORY: Jason Summers 82 y.o. male returns to the clinic today for follow-up visit accompanied by his wife. The patient is feeling a little bit better today. He started over-the-counter cough with Dylsem and felt much better. He is scheduled to see Dr. Fuller Plan intermedius in 2 weeks for evaluation of the dysphagia and questionable radiation induced esophagitis. He denied having any chest pain but has shortness breath with exertion with no hemoptysis. He denied having any significant weight loss or night sweats. He had repeat CT scan of the chest performed recently for restaging of his disease and he is here today for evaluation and discussion of his scan results.  MEDICAL HISTORY: Past Medical History:  Diagnosis Date  . Arthritis   . Cancer ([place])    basal cell on right temple  . Hemorrhoids   . Lateral epicondylitis (tennis elbow)   . Non-small cell lung cancer (NSCLC) ([place]) dx'f 07/30/15  . Odynophagia 11/06/2015  . Spondylosis of cervical joint     ALLERGIES:  has No Known Allergies.  MEDICATIONS:  Current Outpatient Prescriptions  Medication Sig Dispense Refill  . aspirin 81 MG EC tablet Take 81 mg by mouth every other day. Reported on 10/16/2015    . esomeprazole (NEXIUM) 40 MG capsule Take 1 capsule  (40 mg total) by mouth daily at 12 noon. 90 capsule 3  . fish oil-omega-3 fatty acids 1000 MG capsule Take 2 g by mouth 2 (two) times daily.      Marland Kitchen glucosamine-chondroitin 500-400 MG tablet Take 1 tablet by mouth 2 (two) times daily. Reported on 09/11/2015    . ibuprofen (ADVIL,MOTRIN) 200 MG tablet Take 400 mg by mouth daily as needed for moderate pain. Reported on 09/27/2015    . omega-3 acid ethyl esters (LOVAZA) 1 g capsule Take by mouth.    . Wound Dressings (SONAFINE EX) Apply topically.     No current facility-administered medications for this visit.    Facility-Administered Medications Ordered in Other Visits  Medication Dose Route Frequency Provider Last Rate Last Dose  . Influenza vac split quadrivalent PF (FLUARIX) injection 0.5 mL  0.5 mL Intramuscular Once Curt Bears, MD        SURGICAL HISTORY:  Past Surgical History:  Procedure Laterality Date  . COLONOSCOPY  2003  . KNEE ARTHROSCOPY  2006   right    REVIEW OF SYSTEMS:  A comprehensive review of systems was negative except for: Constitutional: positive for fatigue Respiratory: positive for cough Gastrointestinal: positive for dysphagia   PHYSICAL EXAMINATION: General appearance: alert, cooperative, fatigued and no distress Head: Normocephalic, without obvious abnormality, atraumatic Neck: no adenopathy, no JVD, supple, symmetrical, trachea midline and thyroid not enlarged, symmetric, no tenderness/mass/nodules Lymph nodes: Cervical, supraclavicular, and axillary nodes normal. Resp: clear to auscultation bilaterally Back: symmetric, no curvature. ROM normal. No CVA tenderness. Cardio: regular rate and rhythm, S1, S2  normal, no murmur, click, rub or gallop GI: soft, non-tender; bowel sounds normal; no masses,  no organomegaly Extremities: extremities normal, atraumatic, no cyanosis or edema Neurologic: Alert and oriented X 3, normal strength and tone. Normal symmetric reflexes. Normal coordination and gait  ECOG  PERFORMANCE STATUS: 1 - Symptomatic but completely ambulatory  Blood pressure 133/69, pulse 86, temperature 98.2 F (36.8 C), temperature source Oral, resp. rate 18, weight 174 lb 4.8 oz (79.1 kg), SpO2 95 %.  LABORATORY DATA: Lab Results  Component Value Date   WBC 7.8 04/24/2016   HGB 13.4 04/24/2016   HCT 40.4 04/24/2016   MCV 85.0 04/24/2016   PLT 199 04/24/2016      Chemistry      Component Value Date/Time   NA 137 04/24/2016 1019   K 4.3 04/24/2016 1019   CL 103 08/22/2015 1443   CO2 21 (L) 04/24/2016 1019   BUN 16.1 04/24/2016 1019   CREATININE 0.8 04/24/2016 1019      Component Value Date/Time   CALCIUM 9.2 04/24/2016 1019   ALKPHOS 84 04/24/2016 1019   AST 19 04/24/2016 1019   ALT 19 04/24/2016 1019   BILITOT 0.49 04/24/2016 1019       RADIOGRAPHIC STUDIES: Ct Chest W Contrast  Result Date: 05/05/2016 CLINICAL DATA:  Lung cancer. EXAM: CT CHEST WITH CONTRAST TECHNIQUE: Multidetector CT imaging of the chest was performed during intravenous contrast administration. CONTRAST:  72m ISOVUE-300 IOPAMIDOL (ISOVUE-300) INJECTION 61% COMPARISON:  01/29/2016, 12/31/2015 and 08/23/2015. FINDINGS: Cardiovascular: Atherosclerotic calcification of the arterial vasculature, including coronary arteries. Heart is mildly enlarged. No pericardial effusion. Mediastinum/Nodes: Mediastinal lymph nodes measure up to 9 mm in the low right paratracheal station, as before. No subcarinal, hilar or axillary adenopathy. Esophagus is minimally dilated and air-filled throughout its course. Lungs/Pleura: Image quality is degraded by respiratory motion. Post treatment consolidation, bronchiectasis and volume loss in the medial aspect of the upper/mid left hemi thorax. Patchy consolidation and bronchiectasis in the left lower lobe, adjacent to a moderate left pleural effusion. Associated pleural thickening/calcification on the left. Previously seen left upper lobe nodule is not readily appreciated.  Scattered subpleural reticular densities, subpleural consolidation and bronchiectasis bilaterally, slightly progressive from 01/29/2016. Upper Abdomen: Low-attenuation lesions in the liver measure up to 3.8 cm in the left hepatic lobe, as before, likely cysts. Visualized portions of the liver and gallbladder are otherwise unremarkable. Adrenal glands are mildly nodular in appearance, unchanged. Visualized portions of the kidneys, spleen, pancreas, stomach and bowel are grossly unremarkable. No upper abdominal adenopathy. Musculoskeletal: No worrisome lytic or sclerotic lesions. Degenerative changes are seen in the spine. IMPRESSION: 1. Post treatment changes in the upper/mid left hemi thorax with a moderate left pleural effusion. Associated left pleural thickening/calcification. 2. Scattered subpleural reticular densities, ground-glass, consolidation and bronchiectasis bilaterally, progressive or more organized than on 01/29/2016, possibly representing treatment related (chemotherapy) fibrosis. 3. Previously seen tiny left upper lobe nodule is not readily appreciated. 4. Aortic atherosclerosis (ICD10-170.0). Coronary artery calcification. Electronically Signed   By: MLorin PicketM.D.   On: 05/05/2016 08:47    ASSESSMENT AND PLAN: This is a very pleasant 82years old white male recently diagnosed with a stage IIIa non-small cell lung cancer, squamous cell carcinoma and currently undergoing a course of concurrent chemoradiation with weekly carboplatin and paclitaxel status post 7 cycles. He is feeling fine today except for the dysphagia and cough. His cough is a little bit better. The recent CT scan of the chest showed posttreatment changes with  market fibrosis and a small left pleural effusion. There is no clear evidence for disease progression. I discussed the scan results and showed the images to the patient and his wife. I recommended for him to continue on observation with repeat CT scan of the chest in  3 months. For the radiation-induced pneumonitis, I will start the patient on a tapered dose of prednisone 40 mg by mouth daily for 1 week followed by 30 mg by mouth daily for 1 week followed by 20 mg by mouth daily for 1 week followed by 10 mg by mouth daily for 1 week and then stop. For the dysphagia, he is scheduled to see Dr. Fuller Plan for evaluation and management of his condition. The patient would also receive flu vaccine today. For the deconditioning, I strongly encouraged the patient to start exercising gradually at regular basis. The patient was advised to call immediately if he has any concerning symptoms in the interval. The patient voices understanding of current disease status and treatment options and is in agreement with the current care plan.  All questions were answered. The patient knows to call the clinic with any problems, questions or concerns. We can certainly see the patient much sooner if necessary.  Disclaimer: This note was dictated with voice recognition software. Similar sounding words can inadvertently be transcribed and may not be corrected upon review.

## 2016-05-07 NOTE — Telephone Encounter (Signed)
GAVE PATIENT AVS REPORT AND APPOINTMENTS FOR February AND MARCH. [facility] WILL CALL RE SCAN.

## 2016-05-07 NOTE — Progress Notes (Signed)
Flu vaccine given in a different encounter.

## 2016-05-09 ENCOUNTER — Ambulatory Visit (HOSPITAL_COMMUNITY)
Admission: RE | Admit: 2016-05-09 | Discharge: 2016-05-09 | Disposition: A | Discharge status: Home / Self Care | Payer: Medicare Other | Source: Ambulatory Visit | Attending: Gastroenterology | Admitting: Gastroenterology

## 2016-05-09 DIAGNOSIS — R1310 Dysphagia, unspecified: Secondary | ICD-10-CM | POA: Diagnosis not present

## 2016-05-09 DIAGNOSIS — K219 Gastro-esophageal reflux disease without esophagitis: Secondary | ICD-10-CM | POA: Diagnosis not present

## 2016-05-09 DIAGNOSIS — R112 Nausea with vomiting, unspecified: Secondary | ICD-10-CM | POA: Diagnosis not present

## 2016-05-09 DIAGNOSIS — Z8719 Personal history of other diseases of the digestive system: Secondary | ICD-10-CM

## 2016-05-09 DIAGNOSIS — K225 Diverticulum of esophagus, acquired: Secondary | ICD-10-CM | POA: Diagnosis not present

## 2016-05-09 DIAGNOSIS — K222 Esophageal obstruction: Secondary | ICD-10-CM | POA: Diagnosis not present

## 2016-05-15 ENCOUNTER — Encounter: Payer: Self-pay | Admitting: Gastroenterology

## 2016-05-15 DIAGNOSIS — R112 Nausea with vomiting, unspecified: Secondary | ICD-10-CM | POA: Insufficient documentation

## 2016-05-15 DIAGNOSIS — K219 Gastro-esophageal reflux disease without esophagitis: Secondary | ICD-10-CM | POA: Insufficient documentation

## 2016-05-15 DIAGNOSIS — Z8719 Personal history of other diseases of the digestive system: Secondary | ICD-10-CM | POA: Insufficient documentation

## 2016-05-15 HISTORY — DX: Nausea with vomiting, unspecified: R11.2

## 2016-05-15 HISTORY — DX: Gastro-esophageal reflux disease without esophagitis: K21.9

## 2016-05-15 NOTE — Progress Notes (Signed)
Reviewed and agree with initial management plan.  Pricilla Riffle. Fuller Plan, MD [facility]

## 2016-05-15 NOTE — Progress Notes (Signed)
04/29/2016 Jason Summers [id number] 1932-02-11   HISTORY OF PRESENT ILLNESS:  This is a pleasant 82 year old male who is present under Dr. Deatra Ina for complaints of dysphagia. Last EGD was in 03/2011 at which time he was found to have a stricture at the GE junction that was dilated with [facility] dilator.  He presents to our office today at the request of his oncologist, Dr. Earlie Server, for evaluation of recurrent dysphagia. He and his wife both report that he has been getting food stuck when he swallows and has to drink a good amount of water to wash the food down. Does not tend to have any issues when drinking liquids independently. Has been occurring again for the past few months.  He also reports nausea, belching, some burning in his upper abdomen. He is not currently on a PPI.  He has non-small cell lung cancer and did undergo radiation (and chemo) earlier this year.   Past Medical History:  Diagnosis Date  . Arthritis   . Cancer ([place])    basal cell on right temple  . Hemorrhoids   . Lateral epicondylitis (tennis elbow)   . Non-small cell lung cancer (NSCLC) ([place]) dx'f 07/30/15  . Odynophagia 11/06/2015  . Spondylosis of cervical joint    Past Surgical History:  Procedure Laterality Date  . COLONOSCOPY  2003  . KNEE ARTHROSCOPY  2006   right    reports that he quit smoking about 5 years ago. His smoking use included Cigarettes. He has a 12.40 pack-year smoking history. He has never used smokeless tobacco. He reports that he drinks about 4.2 oz of alcohol per week . He reports that he does not use drugs. family history includes Heart disease in his mother; Hypertension in his mother. No Known Allergies    Outpatient Encounter Prescriptions as of 04/29/2016  Medication Sig  . aspirin 81 MG EC tablet Take 81 mg by mouth every other day. Reported on 10/16/2015  . fish oil-omega-3 fatty acids 1000 MG capsule Take 2 g by mouth 2 (two) times daily.    Marland Kitchen glucosamine-chondroitin 500-400  MG tablet Take 1 tablet by mouth 2 (two) times daily. Reported on 09/11/2015  . ibuprofen (ADVIL,MOTRIN) 200 MG tablet Take 400 mg by mouth daily as needed for moderate pain. Reported on 09/27/2015  . omega-3 acid ethyl esters (LOVAZA) 1 g capsule Take by mouth.  . Wound Dressings (SONAFINE EX) Apply topically.  . [DISCONTINUED] dexamethasone (DECADRON) 4 MG tablet Take 1 tablet (4 mg total) by mouth 2 (two) times daily.  . [DISCONTINUED] Eyelid Cleansers (AVENOVA) 0.01 % SOLN   . [DISCONTINUED] LORazepam (ATIVAN) 0.5 MG tablet Take 1 tablet (0.5 mg total) by mouth 2 (two) times daily as needed for anxiety.  . [DISCONTINUED] omeprazole (PRILOSEC) 20 MG capsule Take 20 mg by mouth as needed (heartburn).   Marland Kitchen esomeprazole (NEXIUM) 40 MG capsule Take 1 capsule (40 mg total) by mouth daily at 12 noon.  . [DISCONTINUED] levofloxacin (LEVAQUIN) 500 MG tablet Take 1 tablet (500 mg total) by mouth daily.  . [DISCONTINUED] methylPREDNISolone (MEDROL DOSEPAK) 4 MG TBPK tablet Use as instructed   No facility-administered encounter medications on file as of 04/29/2016.      REVIEW OF SYSTEMS  : All other systems reviewed and negative except where noted in the History of Present Illness.   PHYSICAL EXAM: BP 110/70 (BP Location: Left Arm, Patient Position: Sitting, Cuff Size: Normal)   Pulse 100   Ht 5' 7.75" (1.721 m)  Wt 173 lb 12.8 oz (78.8 kg)   BMI 26.62 kg/m  General: Well developed white male in no acute distress Head: Normocephalic and atraumatic Eyes:  Sclerae anicteric, conjunctiva pink. Ears: Normal auditory acuity Lungs: Clear throughout to auscultation Heart: Regular rate and rhythm Abdomen: Soft, non-distended.  Normal bowel sounds.  Non-tender. Musculoskeletal: Symmetrical with no gross deformities  Skin: No lesions on visible extremities Extremities: No edema  Neurological: Alert oriented x 4, grossly non-focal Psychological:  Alert and cooperative. Normal mood and  affect  ASSESSMENT AND PLAN: -82 year-old male with history of esophageal stricture who is presenting now with recurrent dysphagia, GERD, and nausea/belching.  He may need repeat EGD with dilation, but I think that due to his advanced age and diagnosis of non-small cell lung cancer that we should proceed with barium esophagram first to confirm that there would be something there that can be fixed endoscopically. ? If he could have a radiation induced stricture now as well or radiation induced esophagitis.  We will schedule barium esophagram, but we'll also plan to proceed with EGD tentatively, pending the results of the esophagram. Patient and family are requesting Dr. Fuller Plan.  Will also start him back on a PPI in the form of Nexium 40 mg daily.  *The risks, benefits, and alternatives to EGD and dilation were discussed with the patient and he consents to proceed.   CC:  Curt Bears, MD

## 2016-05-20 ENCOUNTER — Ambulatory Visit (AMBULATORY_SURGERY_CENTER): Payer: Medicare Other | Admitting: Gastroenterology

## 2016-05-20 ENCOUNTER — Encounter: Payer: Self-pay | Admitting: Gastroenterology

## 2016-05-20 VITALS — BP 124/73 | HR 73 | Temp 97.3°F | Resp 20 | Ht 67.0 in | Wt 173.0 lb

## 2016-05-20 DIAGNOSIS — R1310 Dysphagia, unspecified: Secondary | ICD-10-CM | POA: Diagnosis not present

## 2016-05-20 DIAGNOSIS — R933 Abnormal findings on diagnostic imaging of other parts of digestive tract: Secondary | ICD-10-CM

## 2016-05-20 MED ORDER — ESOMEPRAZOLE MAGNESIUM 40 MG PO CPDR: 40.0000 mg | DELAYED_RELEASE_CAPSULE | Freq: Every day | ORAL | 3 refills | Status: DC

## 2016-05-20 MED ORDER — SODIUM CHLORIDE 0.9 % IV SOLN: 500.0000 mL | INTRAVENOUS | Status: DC

## 2016-05-20 NOTE — Op Note (Signed)
[place] Patient Name: Jason Summers Procedure Date: 05/20/2016 9:58 AM MRN: [id number] Endoscopist: Ladene Artist , MD Age: 82 Referring MD:  Date of Birth: 1932-05-13 Gender: Male Account #: [id number] Procedure:                Upper GI endoscopy Indications:              Dysphagia, Abnormal cine-esophagram Medicines:                Monitored Anesthesia Care Procedure:                Pre-Anesthesia Assessment:                           - Prior to the procedure, a History and Physical                            was performed, and patient medications and                            allergies were reviewed. The patient's tolerance of                            previous anesthesia was also reviewed. The risks                            and benefits of the procedure and the sedation                            options and risks were discussed with the patient.                            All questions were answered, and informed consent                            was obtained. Prior Anticoagulants: The patient has                            taken no previous anticoagulant or antiplatelet                            agents. ASA Grade Assessment: III - A patient with                            severe systemic disease. After reviewing the risks                            and benefits, the patient was deemed in                            satisfactory condition to undergo the procedure.                           After obtaining informed consent, the endoscope was  passed under direct vision. Throughout the                            procedure, the patient's blood pressure, pulse, and                            oxygen saturations were monitored continuously. The                            Model GIF-HQ190 [id number]) scope was introduced                            through the mouth, and advanced to the second part                            of duodenum. The  upper GI endoscopy was                            accomplished without difficulty. The patient                            tolerated the procedure well. Scope In: Scope Out: Findings:                 A diverticulum and no stigmata of recent bleeding                            was found in the proximal esophagus.                           LA Grade B (one or more mucosal breaks greater than                            5 mm, not extending between the tops of two mucosal                            folds) esophagitis with no bleeding was found in                            the distal esophagus.                           One mild benign-appearing, intrinsic stenosis was                            found at the gastroesophageal junction. This                            measured 1.4 cm (inner diameter) and was traversed.                            A guidewire was placed and the scope was withdrawn.  Dilation was performed with Savary dilators with                            mild resistance at 15 mm and 16 mm. Estimated blood                            loss: none.                           The exam of the esophagus was otherwise normal.                           A small hiatal hernia was present.                           The exam of the stomach was otherwise normal.                           The duodenal bulb and second portion of the                            duodenum were normal. Complications:            No immediate complications. Estimated Blood Loss:     Estimated blood loss: none. 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                           - LA Grade B reflux esophagitis.                           - Benign-appearing esophageal stenosis. Dilated.                           - Small hiatal hernia.                           - Normal duodenal bulb and second portion of the                            duodenum.                            - No specimens collected. Recommendation:           - Patient has a contact number available for                            emergencies. The signs and symptoms of potential                            delayed complications were discussed with the                            patient. Return to normal activities tomorrow.  Written discharge instructions were provided to the                            patient.                           - Clear liquid diet for 2 hours then advance as                            tolerated to soft diet today. Resume prior diet                            tomorrow.                           - Continue Nexium 40 mg qam long term                           - Antireflux measures long term                           - Continue present medications.                           - Office appt in 1 year and prn Ladene Artist, MD 05/20/2016 10:24:06 AM This report has been signed electronically.

## 2016-05-20 NOTE — Patient Instructions (Signed)
YOU HAD AN ENDOSCOPIC PROCEDURE TODAY AT [facility]:   Refer to the procedure report that was given to you for any specific questions about what was found during the examination.  If the procedure report does not answer your questions, please call your gastroenterologist to clarify.  If you requested that your care partner not be given the details of your procedure findings, then the procedure report has been included in a sealed envelope for you to review at your convenience later.  YOU SHOULD EXPECT: Some feelings of bloating in the abdomen. Passage of more gas than usual.  Walking can help get rid of the air that was put into your GI tract during the procedure and reduce the bloating. If you had a lower endoscopy (such as a colonoscopy or flexible sigmoidoscopy) you may notice spotting of blood in your stool or on the toilet paper. If you underwent a bowel prep for your procedure, you may not have a normal bowel movement for a few days.  Please Note:  You might notice some irritation and congestion in your nose or some drainage.  This is from the oxygen used during your procedure.  There is no need for concern and it should clear up in a day or so.  SYMPTOMS TO REPORT IMMEDIATELY:    Following upper endoscopy (EGD)  Vomiting of blood or coffee ground material  New chest pain or pain under the shoulder blades  Painful or persistently difficult swallowing  New shortness of breath  Fever of 100F or higher  Black, tarry-looking stools  For urgent or emergent issues, a gastroenterologist can be reached at any hour by calling [phone].   DIET: See dilation handout.  Drink plenty of fluids but you should avoid alcoholic beverages for 24 hours.  ACTIVITY:  You should plan to take it easy for the rest of today and you should NOT DRIVE or use heavy machinery until tomorrow (because of the sedation medicines used during the test).    FOLLOW UP: Our staff will call the  number listed on your records the next business day following your procedure to check on you and address any questions or concerns that you may have regarding the information given to you following your procedure. If we do not reach you, we will leave a message.  However, if you are feeling well and you are not experiencing any problems, there is no need to return our call.  We will assume that you have returned to your regular daily activities without incident.  If any biopsies were taken you will be contacted by phone or by letter within the next 1-3 weeks.  Please call us at [phone] if you have not heard about the biopsies in 3 weeks.    SIGNATURES/CONFIDENTIALITY: You and/or your care partner have signed paperwork which will be entered into your electronic medical record.  These signatures attest to the fact that that the information above on your After Visit Summary has been reviewed and is understood.  Full responsibility of the confidentiality of this discharge information lies with you and/or your care-partner.  Post Dilation Diet  (handout given) Esophagitis and Stricture (handout given) Hiatal Hernia (handout given) Antireflux measures (handouts given) Continue Nexium 40 mg every morning  Office visit in one year, You will need to schedule appointment closer to time.

## 2016-05-20 NOTE — Progress Notes (Signed)
Report to PACU, RN, vss, BBS= Clear.

## 2016-05-20 NOTE — Progress Notes (Signed)
Called to room to assist during endoscopic procedure.  Patient ID and intended procedure confirmed with present staff. Received instructions for my participation in the procedure from the performing physician.

## 2016-05-21 ENCOUNTER — Telehealth: Payer: Self-pay | Admitting: *Deleted

## 2016-05-21 NOTE — Telephone Encounter (Signed)
  Follow up Call-  Call back number 05/20/2016  Post procedure Call Back phone  # [phone]  Permission to leave phone message Yes  Some recent data might be hidden     Patient questions:  Do you have a fever, pain , or abdominal swelling? No. Pain Score  0 *  Have you tolerated food without any problems? Yes.    Have you been able to return to your normal activities? Yes.    Do you have any questions about your discharge instructions: Diet   No. Medications  No. Follow up visit  No.  Do you have questions or concerns about your Care? No.  Actions: * If pain score is 4 or above: No action needed, pain <4.

## 2016-06-04 ENCOUNTER — Other Ambulatory Visit: Payer: Medicare Other

## 2016-06-11 ENCOUNTER — Ambulatory Visit: Payer: Medicare Other | Admitting: Internal Medicine

## 2016-07-01 ENCOUNTER — Telehealth: Payer: Self-pay

## 2016-07-01 NOTE — Telephone Encounter (Signed)
Wife called stating they have CT appt scheduled 08/05/16 and was asking if an MRI brain can be added to appt.  Returned call:pt has headaches quite often but not changed in any way from last MRI brain done 02/01/16. No dizzyness, no blurry vision, no lethary, no confusion. He cannot bend forward or he will get a headache. She is checking if it is time for a re-evaluation.

## 2016-07-03 ENCOUNTER — Telehealth: Payer: Self-pay | Admitting: *Deleted

## 2016-07-03 NOTE — Telephone Encounter (Signed)
Returned call to pt to notify per MD, No MRI will be ordered at this time.Last MRI 01/2016.  Unable to reach pt- phone # listed rings with no voicemail or option to leave message.

## 2016-07-14 ENCOUNTER — Encounter: Payer: Self-pay | Admitting: Internal Medicine

## 2016-07-14 ENCOUNTER — Ambulatory Visit (INDEPENDENT_AMBULATORY_CARE_PROVIDER_SITE_OTHER): Payer: Medicare Other | Admitting: Internal Medicine

## 2016-07-14 VITALS — BP 122/68 | HR 81 | Temp 97.9°F | Ht 67.0 in | Wt 174.4 lb

## 2016-07-14 DIAGNOSIS — E7800 Pure hypercholesterolemia, unspecified: Secondary | ICD-10-CM | POA: Diagnosis not present

## 2016-07-14 DIAGNOSIS — C3492 Malignant neoplasm of unspecified part of left bronchus or lung: Secondary | ICD-10-CM | POA: Diagnosis not present

## 2016-07-14 DIAGNOSIS — H6983 Other specified disorders of Eustachian tube, bilateral: Secondary | ICD-10-CM | POA: Diagnosis not present

## 2016-07-14 MED ORDER — FLUTICASONE PROPIONATE 50 MCG/ACT NA SUSP
2.0000 | Freq: Every day | NASAL | 6 refills | Status: DC
Start: 2016-07-14 — End: 2016-08-12

## 2016-07-14 NOTE — Progress Notes (Signed)
Pre visit review using our clinic review tool, if applicable. No additional management support is needed unless otherwise documented below in the visit note.

## 2016-07-14 NOTE — Progress Notes (Signed)
Subjective:    Patient ID: Jason Summers, male    DOB: 1931/08/11, 83 y.o.   MRN: [id number]  HPI  83 year old patient who is seen today for his six-month follow-up. He is followed closely by oncology with a history of stage IIIa non-small cell lung cancer.  He is status post chemoradiation and is scheduled for imaging studies later this month.  His only complaint today is fullness in both ears.  This has been present for 3-4 weeks.  Past Medical History:  Diagnosis Date  . Arthritis   . Cancer ([place])    basal cell on right temple  . Hemorrhoids   . Lateral epicondylitis (tennis elbow)   . Non-small cell lung cancer (NSCLC) ([place]) dx'f 07/30/15  . Odynophagia 11/06/2015  . Spondylosis of cervical joint      Social History   Social History  . Marital status: Married    Spouse name: N/A  . Number of children: N/A  . Years of education: N/A   Occupational History  . Retired      Games developer   Social History Main Topics  . Smoking status: Former Smoker    Packs/day: 0.20    Years: 62.00    Types: Cigarettes    Quit date: 03/10/2011  . Smokeless tobacco: Never Used     Comment: smokes 1 ciagettes occasionally  . Alcohol use 4.2 oz/week    7 Glasses of wine per week     Comment: 1-2 glasses of wine a day  . Drug use: No  . Sexual activity: Yes   Other Topics Concern  . Not on file   Social History Narrative  . No narrative on file    Past Surgical History:  Procedure Laterality Date  . COLONOSCOPY  2003  . KNEE ARTHROSCOPY  2006   right    Family History  Problem Relation Age of Onset  . Hypertension Mother   . Heart disease Mother   . Coronary artery disease      No Known Allergies  Current Outpatient Prescriptions on File Prior to Visit  Medication Sig Dispense Refill  . aspirin 81 MG EC tablet Take 81 mg by mouth every other day. Reported on 10/16/2015    . esomeprazole (NEXIUM) 40 MG capsule Take 1 capsule (40 mg total) by mouth daily at 12 noon. 90  capsule 3  . fish oil-omega-3 fatty acids 1000 MG capsule Take 2 g by mouth 2 (two) times daily.      Marland Kitchen glucosamine-chondroitin 500-400 MG tablet Take 1 tablet by mouth 2 (two) times daily. Reported on 09/11/2015     No current facility-administered medications on file prior to visit.     BP 122/68 (BP Location: Left Arm, Patient Position: Sitting, Cuff Size: Normal)   Pulse 81   Temp 97.9 F (36.6 C) (Oral)   Ht _0  (1.702 m)   Wt 174 lb 6.4 oz (79.1 kg)   SpO2 97%   BMI 27.31 kg/m    Review of Systems  Constitutional: Negative for appetite change, chills, fatigue and fever.  HENT: Positive for congestion and sinus pressure. Negative for dental problem, ear pain, hearing loss, sore throat, tinnitus, trouble swallowing and voice change.   Eyes: Negative for pain, discharge and visual disturbance.  Respiratory: Negative for cough, chest tightness, wheezing and stridor.   Cardiovascular: Negative for chest pain, palpitations and leg swelling.  Gastrointestinal: Negative for abdominal distention, abdominal pain, blood in stool, constipation, diarrhea, nausea and vomiting.  Genitourinary:  Negative for difficulty urinating, discharge, flank pain, genital sores, hematuria and urgency.  Musculoskeletal: Negative for arthralgias, back pain, gait problem, joint swelling, myalgias and neck stiffness.  Skin: Negative for rash.  Neurological: Negative for dizziness, syncope, speech difficulty, weakness, numbness and headaches.  Hematological: Negative for adenopathy. Does not bruise/bleed easily.  Psychiatric/Behavioral: Negative for behavioral problems and dysphoric mood. The patient is not nervous/anxious.        Objective:   Physical Exam  Constitutional: He is oriented to person, place, and time. He appears well-developed.  Blood pressure 120/70 Weight stable  HENT:  Head: Normocephalic.  Right Ear: External ear normal.  Left Ear: External ear normal.  Minimal wax in the canals.   Tympanic membranes were normal  Eyes: Conjunctivae and EOM are normal.  Neck: Normal range of motion.  No cervical or supraclavicular adenopathy  Cardiovascular: Normal rate and normal heart sounds.   Pulmonary/Chest: Breath sounds normal. No respiratory distress. He has no wheezes. He has no rales.  Abdominal: Bowel sounds are normal.  Musculoskeletal: Normal range of motion. He exhibits no edema or tenderness.  Neurological: He is alert and oriented to person, place, and time.  Psychiatric: He has a normal mood and affect. His behavior is normal.          Assessment & Plan:   Eustachian tube dysfunction Stage IIIa non-small cell lung cancer.  Follow-up oncology  Measures discussed Fluticasone nasal spray called in CPX 6 months  [id number],PETER Pilar Plate

## 2016-07-14 NOTE — Patient Instructions (Addendum)
Eustachian Tube Dysfunction Introduction The eustachian tube connects the middle ear to the back of the nose. It regulates air pressure in the middle ear by allowing air to move between the ear and nose. It also helps to drain fluid from the middle ear space. When the eustachian tube does not function properly, air pressure, fluid, or both can build up in the middle ear. Eustachian tube dysfunction can affect one or both ears. What are the causes? This condition happens when the eustachian tube becomes blocked or cannot open normally. This may result from:  Ear infections.  Colds and other upper respiratory infections.  Allergies.  Irritation, such as from cigarette smoke or acid from the stomach coming up into the esophagus (gastroesophageal reflux).  Sudden changes in air pressure, such as from descending in an airplane.  Abnormal growths in the nose or throat, such as nasal polyps, tumors, or enlarged tissue at the back of the throat (adenoids). What increases the risk? This condition may be more likely to develop in people who smoke and people who are overweight. Eustachian tube dysfunction may also be more likely to develop in children, especially children who have:  Certain birth defects of the mouth, such as cleft palate.  Large tonsils and adenoids. What are the signs or symptoms? Symptoms of this condition may include:  A feeling of fullness in the ear.  Ear pain.  Clicking or popping noises in the ear.  Ringing in the ear.  Hearing loss.  Loss of balance. Symptoms may get worse when the air pressure around you changes, such as when you travel to an area of high elevation or fly on an airplane. How is this diagnosed? This condition may be diagnosed based on:  Your symptoms.  A physical exam of your ear, nose, and throat.  Tests, such as those that measure:  The movement of your eardrum (tympanogram).  Your hearing (audiometry). How is this  treated? Treatment depends on the cause and severity of your condition. If your symptoms are mild, you may be able to relieve your symptoms by moving air into ("popping") your ears. If you have symptoms of fluid in your ears, treatment may include:  Decongestants.  Antihistamines.  Nasal sprays or ear drops that contain medicines that reduce swelling (steroids). In some cases, you may need to have a procedure to drain the fluid in your eardrum (myringotomy). In this procedure, a small tube is placed in the eardrum to:  Drain the fluid.  Restore the air in the middle ear space. Follow these instructions at home:  Take over-the-counter and prescription medicines only as told by your health care provider.  Use techniques to help pop your ears as recommended by your health care provider. These may include:  Chewing gum.  Yawning.  Frequent, forceful swallowing.  Closing your mouth, holding your nose closed, and gently blowing as if you are trying to blow air out of your nose.  Do not do any of the following until your health care provider approves:  Travel to high altitudes.  Fly in airplanes.  Work in a Pension scheme manager or room.  Scuba dive.  Keep your ears dry. Dry your ears completely after showering or bathing.  Do not smoke.  Keep all follow-up visits as told by your health care provider. This is important. Contact a health care provider if:  Your symptoms do not go away after treatment.  Your symptoms come back after treatment.  You are unable to pop your ears.  You have:  A fever.  Pain in your ear.  Pain in your head or neck.  Fluid draining from your ear.  Your hearing suddenly changes.  You become very dizzy.  You lose your balance. This information is not intended to replace advice given to you by your health care provider. Make sure you discuss any questions you have with your health care provider. Document Released: 06/22/2015 Document  Revised: 11/01/2015 Document Reviewed: 06/14/2014  2017 [employer]vier

## 2016-07-24 ENCOUNTER — Telehealth: Payer: Self-pay | Admitting: *Deleted

## 2016-07-24 NOTE — Telephone Encounter (Signed)
Wife called stating that patient is having worsening headaches and really concerned. Wife is still wanting an MRI done. RN informed wife (looking at recent note) that MD Julien Nordmann does not recommend and MRI at this time. Wife still would like MD Mohamed to be made aware.

## 2016-07-26 NOTE — Telephone Encounter (Signed)
We will repeat another MRI for assurance.

## 2016-08-05 ENCOUNTER — Encounter (HOSPITAL_COMMUNITY): Payer: Self-pay

## 2016-08-05 ENCOUNTER — Ambulatory Visit (HOSPITAL_COMMUNITY)
Admission: RE | Admit: 2016-08-05 | Discharge: 2016-08-05 | Disposition: A | Discharge status: Home / Self Care | Payer: Medicare Other | Source: Ambulatory Visit | Attending: Internal Medicine | Admitting: Internal Medicine

## 2016-08-05 ENCOUNTER — Other Ambulatory Visit (HOSPITAL_BASED_OUTPATIENT_CLINIC_OR_DEPARTMENT_OTHER): Payer: Medicare Other

## 2016-08-05 DIAGNOSIS — E279 Disorder of adrenal gland, unspecified: Secondary | ICD-10-CM | POA: Insufficient documentation

## 2016-08-05 DIAGNOSIS — C3432 Malignant neoplasm of lower lobe, left bronchus or lung: Secondary | ICD-10-CM | POA: Diagnosis not present

## 2016-08-05 DIAGNOSIS — R918 Other nonspecific abnormal finding of lung field: Secondary | ICD-10-CM | POA: Insufficient documentation

## 2016-08-05 DIAGNOSIS — J849 Interstitial pulmonary disease, unspecified: Secondary | ICD-10-CM | POA: Insufficient documentation

## 2016-08-05 DIAGNOSIS — J439 Emphysema, unspecified: Secondary | ICD-10-CM | POA: Diagnosis not present

## 2016-08-05 DIAGNOSIS — C3492 Malignant neoplasm of unspecified part of left bronchus or lung: Secondary | ICD-10-CM | POA: Insufficient documentation

## 2016-08-05 DIAGNOSIS — J90 Pleural effusion, not elsewhere classified: Secondary | ICD-10-CM | POA: Diagnosis not present

## 2016-08-05 DIAGNOSIS — I2510 Atherosclerotic heart disease of native coronary artery without angina pectoris: Secondary | ICD-10-CM | POA: Diagnosis not present

## 2016-08-05 DIAGNOSIS — I700 Atherosclerosis of aorta: Secondary | ICD-10-CM | POA: Insufficient documentation

## 2016-08-05 DIAGNOSIS — Z23 Encounter for immunization: Secondary | ICD-10-CM

## 2016-08-05 LAB — COMPREHENSIVE METABOLIC PANEL
ALT: 14 U/L (ref 0–55)
ANION GAP: 7 meq/L (ref 3–11)
AST: 17 U/L (ref 5–34)
Albumin: 3.7 g/dL (ref 3.5–5.0)
Alkaline Phosphatase: 89 U/L (ref 40–150)
BILIRUBIN TOTAL: 0.47 mg/dL (ref 0.20–1.20)
BUN: 15.5 mg/dL (ref 7.0–26.0)
CO2: 25 mEq/L (ref 22–29)
Calcium: 9.1 mg/dL (ref 8.4–10.4)
Chloride: 106 mEq/L (ref 98–109)
Creatinine: 0.9 mg/dL (ref 0.7–1.3)
EGFR: 78 mL/min/{1.73_m2} — AB (ref >90)
GLUCOSE: 95 mg/dL (ref 70–140)
POTASSIUM: 4.6 meq/L (ref 3.5–5.1)
SODIUM: 138 meq/L (ref 136–145)
TOTAL PROTEIN: 7 g/dL (ref 6.4–8.3)

## 2016-08-05 LAB — CBC WITH DIFFERENTIAL/PLATELET
BASO%: 0.9 % (ref 0.0–2.0)
BASOS ABS: 0.1 10*3/uL (ref 0.0–0.1)
EOS ABS: 0.2 10*3/uL (ref 0.0–0.5)
EOS%: 2 % (ref 0.0–7.0)
HCT: 41.6 % (ref 38.4–49.9)
HGB: 14 g/dL (ref 13.0–17.1)
LYMPH%: 14.7 % (ref 14.0–49.0)
MCH: 29.2 pg (ref 27.2–33.4)
MCHC: 33.6 g/dL (ref 32.0–36.0)
MCV: 86.9 fL (ref 79.3–98.0)
MONO#: 0.9 10*3/uL (ref 0.1–0.9)
MONO%: 11 % (ref 0.0–14.0)
NEUT#: 5.7 10*3/uL (ref 1.5–6.5)
NEUT%: 71.4 % (ref 39.0–75.0)
PLATELETS: 188 10*3/uL (ref 140–400)
RBC: 4.79 10*6/uL (ref 4.20–5.82)
RDW: 15.1 % — ABNORMAL HIGH (ref 11.0–14.6)
WBC: 8.1 10*3/uL (ref 4.0–10.3)
lymph#: 1.2 10*3/uL (ref 0.9–3.3)

## 2016-08-05 MED ORDER — IOPAMIDOL (ISOVUE-300) INJECTION 61%
75.0000 mL | Freq: Once | INTRAVENOUS | Status: AC | PRN
  Administered 2016-08-05: 75 mL via INTRAVENOUS

## 2016-08-05 MED ORDER — IOPAMIDOL (ISOVUE-300) INJECTION 61%
INTRAVENOUS | Status: AC
  Filled 2016-08-05: qty 75

## 2016-08-12 ENCOUNTER — Ambulatory Visit (HOSPITAL_BASED_OUTPATIENT_CLINIC_OR_DEPARTMENT_OTHER): Payer: Medicare Other | Admitting: Internal Medicine

## 2016-08-12 ENCOUNTER — Encounter: Payer: Self-pay | Admitting: Internal Medicine

## 2016-08-12 ENCOUNTER — Telehealth: Payer: Self-pay | Admitting: Internal Medicine

## 2016-08-12 VITALS — BP 129/76 | HR 77 | Temp 98.0°F | Resp 18 | Ht 67.0 in | Wt 174.3 lb

## 2016-08-12 DIAGNOSIS — J449 Chronic obstructive pulmonary disease, unspecified: Secondary | ICD-10-CM

## 2016-08-12 DIAGNOSIS — C3492 Malignant neoplasm of unspecified part of left bronchus or lung: Secondary | ICD-10-CM

## 2016-08-12 DIAGNOSIS — R51 Headache: Secondary | ICD-10-CM | POA: Diagnosis not present

## 2016-08-12 DIAGNOSIS — F419 Anxiety disorder, unspecified: Secondary | ICD-10-CM

## 2016-08-12 DIAGNOSIS — C3432 Malignant neoplasm of lower lobe, left bronchus or lung: Secondary | ICD-10-CM | POA: Diagnosis not present

## 2016-08-12 DIAGNOSIS — K219 Gastro-esophageal reflux disease without esophagitis: Secondary | ICD-10-CM | POA: Diagnosis not present

## 2016-08-12 DIAGNOSIS — R0602 Shortness of breath: Secondary | ICD-10-CM

## 2016-08-12 DIAGNOSIS — R05 Cough: Secondary | ICD-10-CM | POA: Diagnosis not present

## 2016-08-12 DIAGNOSIS — J8410 Pulmonary fibrosis, unspecified: Secondary | ICD-10-CM | POA: Diagnosis not present

## 2016-08-12 DIAGNOSIS — G4452 New daily persistent headache (NDPH): Secondary | ICD-10-CM

## 2016-08-12 NOTE — Progress Notes (Signed)
[place] [phone]   [phone]  OFFICE PROGRESS NOTE  Jason Cowden, MD Jason Summers [id number]  DIAGNOSIS: Stage IIIA (T3, N2, M0) non-small cell lung cancer, poorly differentiated squamous cell carcinoma diagnosed in February 2017 with PDL 1 expression of 35%, presented with large left lower lobe lung mass in addition to left upper lobe lung nodule and mediastinal lymphadenopathy.  PRIOR THERAPY: Course of concurrent chemoradiation with weekly carboplatin for AUC of 2 and paclitaxel 45 MG/M2. Status post 7 cycles, last dose was given 11/19/2015 with partial response.  CURRENT THERAPY: Observation.  INTERVAL HISTORY: Jason Summers 83 y.o. male came to the clinic today for follow-up visit. The patient continues to complain of shortness of breath at baseline and increased with exertion as well as mild cough. He also has some headache especially when leaning forward. He denied having any chest pain or hemoptysis. He has no significant weight loss or night sweats. He has no nausea, vomiting, diarrhea or constipation. He had repeat CT scan of the chest performed recently and the patient is here today for evaluation and discussion of his scan results.  MEDICAL HISTORY: Past Medical History:  Diagnosis Date  . Arthritis   . Cancer ([place])    basal cell on right temple  . Hemorrhoids   . Lateral epicondylitis (tennis elbow)   . Non-small cell lung cancer (NSCLC) ([place]) dx'f 07/30/15  . Odynophagia 11/06/2015  . Spondylosis of cervical joint     ALLERGIES:  is allergic to gentamicin and loteprednol-tobramycin.  MEDICATIONS:  Current Outpatient Prescriptions  Medication Sig Dispense Refill  . aspirin 81 MG EC tablet Take 81 mg by mouth every other day. Reported on 10/16/2015    . Carboxymethylcellulose Sod PF (REFRESH PLUS) 0.5 % SOLN Place 1 drop into both eyes 3 (three) times daily as needed.    Marland Kitchen esomeprazole (NEXIUM)  40 MG capsule Take 1 capsule (40 mg total) by mouth daily at 12 noon. 90 capsule 3  . fish oil-omega-3 fatty acids 1000 MG capsule Take 2 g by mouth 2 (two) times daily.      Marland Kitchen glucosamine-chondroitin 500-400 MG tablet Take 1 tablet by mouth 2 (two) times daily. Reported on 09/11/2015    . LORazepam (ATIVAN) 0.5 MG tablet Take 0.5 mg by mouth.     No current facility-administered medications for this visit.     SURGICAL HISTORY:  Past Surgical History:  Procedure Laterality Date  . COLONOSCOPY  2003  . KNEE ARTHROSCOPY  2006   right    REVIEW OF SYSTEMS:  Constitutional: negative Eyes: negative Ears, nose, mouth, throat, and face: negative Respiratory: positive for cough and dyspnea on exertion Cardiovascular: negative Gastrointestinal: negative Genitourinary:negative Integument/breast: negative Hematologic/lymphatic: negative Musculoskeletal:negative Neurological: positive for headaches Behavioral/Psych: negative Endocrine: negative Allergic/Immunologic: negative   PHYSICAL EXAMINATION: General appearance: alert, cooperative and no distress Head: Normocephalic, without obvious abnormality, atraumatic Neck: no adenopathy, no JVD, supple, symmetrical, trachea midline and thyroid not enlarged, symmetric, no tenderness/mass/nodules Lymph nodes: Cervical, supraclavicular, and axillary nodes normal. Resp: wheezes bilaterally Back: symmetric, no curvature. ROM normal. No CVA tenderness. Cardio: regular rate and rhythm, S1, S2 normal, no murmur, click, rub or gallop GI: soft, non-tender; bowel sounds normal; no masses,  no organomegaly Extremities: extremities normal, atraumatic, no cyanosis or edema Neurologic: Alert and oriented X 3, normal strength and tone. Normal symmetric reflexes. Normal coordination and gait  ECOG PERFORMANCE STATUS: 1 - Symptomatic but completely ambulatory  Blood pressure 129/76, pulse 77, temperature 98 F (36.7 C), temperature source Oral, resp. rate  18, height _0  (1.702 m), weight 174 lb 4.8 oz (79.1 kg), SpO2 94 %.  LABORATORY DATA: Lab Results  Component Value Date   WBC 8.1 08/05/2016   HGB 14.0 08/05/2016   HCT 41.6 08/05/2016   MCV 86.9 08/05/2016   PLT 188 08/05/2016      Chemistry      Component Value Date/Time   NA 138 08/05/2016 0906   K 4.6 08/05/2016 0906   CL 103 08/22/2015 1443   CO2 25 08/05/2016 0906   BUN 15.5 08/05/2016 0906   CREATININE 0.9 08/05/2016 0906      Component Value Date/Time   CALCIUM 9.1 08/05/2016 0906   ALKPHOS 89 08/05/2016 0906   AST 17 08/05/2016 0906   ALT 14 08/05/2016 0906   BILITOT 0.47 08/05/2016 0906       RADIOGRAPHIC STUDIES: Ct Chest W Contrast  Result Date: 08/05/2016 CLINICAL DATA:  Restaging lung cancer. Cough and shortness of breath for 2 weeks. EXAM: CT CHEST WITH CONTRAST TECHNIQUE: Multidetector CT imaging of the chest was performed during intravenous contrast administration. CONTRAST:  38m ISOVUE-300 IOPAMIDOL (ISOVUE-300) INJECTION 61% COMPARISON:  Chest CT 05/05/2016 FINDINGS: Chest wall: No chest wall mass, supraclavicular or axillary lymphadenopathy. The thyroid gland appears normal. Cardiovascular: The heart is normal in size. No pericardial effusion. Stable tortuosity and calcification of the thoracic aorta. No dissection. Stable three-vessel coronary artery calcifications. Mediastinum/Nodes: Stable mediastinal and hilar lymph nodes. The precarinal lymph node on image number 61 measures 15 mm and is stable. The subcarinal lymph node on image number 71 measures 9 mm and is stable. No enlarged hilar lymph nodes. The esophagus is grossly normal. Lungs/Pleura: Stable emphysematous changes and pulmonary scarring. Dense areas of pulmonary fibrosis involving the paramediastinal lungs bilaterally likely reflecting radiation change. There is also stable changes of interstitial lung disease predominating at the lung bases suggesting UIP. Persistent left pleural effusion with  overlying atelectasis. No obvious findings for recurrent tumor or metastatic pulmonary nodules. Upper Abdomen: Stable hepatic cysts. Stable advanced atherosclerotic calcifications involving the aorta and branch vessels. Small scattered lymph nodes are stable. Stable mild nodularity of the left adrenal gland. Musculoskeletal: No significant bony findings. IMPRESSION: 1. Stable emphysematous changes, pulmonary scarring and peripheral interstitial lung disease. 2. Stable dense scarring changes involving both lungs likely due to radiation. 3. Persistent left-sided pleural effusion with overlying atelectasis. 4. Stable borderline mediastinal lymph nodes. 5. Stable atherosclerotic calcifications involving the thoracic and abdominal aorta and branch vessels including the coronary arteries. 6. Stable nodularity of the left adrenal gland. Electronically Signed   By: PMarijo SanesM.D.   On: 08/05/2016 13:23    ASSESSMENT AND PLAN:  This is a very pleasant 83years old white male with stage IIIA non-small cell lung cancer, squamous cell carcinoma. The patient completed a course of concurrent chemoradiation with weekly carboplatin and paclitaxel for 7 cycles and tolerated his treatment well except for radiation induced pneumonitis as well as odynophagia and dysphagia secondary to radiation induced esophagitis. His odynophagia and dysphagia have completely resolved. The patient continues to have persistent cough and shortness of breath. This is partially secondary to previous radiation pneumonitis and scarring, but underlying COPD and pulmonary fibrosis is also a consideration. He had repeat CT scan of the chest performed recently. I personally and independently reviewed the scan images and discuss the results and showed the images to the patient and his  wife. There is no clear evidence for disease progression but the patient has a lot of scarring in the lungs bilaterally more on the left side. I recommended for him  to continue on observation with repeat CT scan of the chest in August 2018 after he comes back from a trip to [place]. In the meantime, I will refer the patient to Dr. Chase Caller for evaluation of his underlying COPD and fibrosis. For the persistent headache, I will order MRI of the brain to be performed in the next few days to rule out any metastatic disease to the brain. For GERD, the patient will continue his treatment with Nexium. For anxiety, he will continue on Ativan on as-needed basis. The patient was advised to call immediately if he has any concerning symptoms in the interval. The patient voices understanding of current disease status and treatment options and is in agreement with the current care plan.  All questions were answered. The patient knows to call the clinic with any problems, questions or concerns. We can certainly see the patient much sooner if necessary.  Disclaimer: This note was dictated with voice recognition software. Similar sounding words can inadvertently be transcribed and may not be corrected upon review.

## 2016-08-12 NOTE — Telephone Encounter (Signed)
Appointments scheduled per 3/6 LOS. Dates and times requested specifically by patient. Patient is not available on 01/23/17 for Labs. Available after 8/20. Patient given AVS report and calendars with future scheduled appointments. Spoke with a [name] to schedule appointment with Margaret Pyle. Patient will be contacted with appointment date and time.

## 2016-08-14 ENCOUNTER — Ambulatory Visit (INDEPENDENT_AMBULATORY_CARE_PROVIDER_SITE_OTHER): Payer: Medicare Other | Admitting: Internal Medicine

## 2016-08-14 ENCOUNTER — Other Ambulatory Visit (INDEPENDENT_AMBULATORY_CARE_PROVIDER_SITE_OTHER): Payer: Medicare Other

## 2016-08-14 ENCOUNTER — Encounter: Payer: Self-pay | Admitting: Internal Medicine

## 2016-08-14 DIAGNOSIS — J439 Emphysema, unspecified: Secondary | ICD-10-CM | POA: Insufficient documentation

## 2016-08-14 DIAGNOSIS — J90 Pleural effusion, not elsewhere classified: Secondary | ICD-10-CM

## 2016-08-14 DIAGNOSIS — R0602 Shortness of breath: Secondary | ICD-10-CM

## 2016-08-14 DIAGNOSIS — J438 Other emphysema: Secondary | ICD-10-CM

## 2016-08-14 DIAGNOSIS — J701 Chronic and other pulmonary manifestations due to radiation: Secondary | ICD-10-CM

## 2016-08-14 DIAGNOSIS — R0600 Dyspnea, unspecified: Secondary | ICD-10-CM | POA: Insufficient documentation

## 2016-08-14 HISTORY — DX: Emphysema, unspecified: J43.9

## 2016-08-14 HISTORY — DX: Shortness of breath: R06.02

## 2016-08-14 HISTORY — DX: Pleural effusion, not elsewhere classified: J90

## 2016-08-14 HISTORY — DX: Chronic and other pulmonary manifestations due to radiation: J70.1

## 2016-08-14 LAB — CBC WITH DIFFERENTIAL/PLATELET
Basophils Absolute: 0.1 10*3/uL (ref 0.0–0.1)
Basophils Relative: 0.6 % (ref 0.0–3.0)
Eosinophils Absolute: 0.2 10*3/uL (ref 0.0–0.7)
Eosinophils Relative: 2 % (ref 0.0–5.0)
HCT: 42.3 % (ref 39.0–52.0)
HEMOGLOBIN: 14.3 g/dL (ref 13.0–17.0)
Lymphocytes Relative: 15.2 % (ref 12.0–46.0)
Lymphs Abs: 1.3 10*3/uL (ref 0.7–4.0)
MCHC: 33.8 g/dL (ref 30.0–36.0)
MCV: 86.2 fl (ref 78.0–100.0)
MONOS PCT: 9.7 % (ref 3.0–12.0)
Monocytes Absolute: 0.8 10*3/uL (ref 0.1–1.0)
Neutro Abs: 6.2 10*3/uL (ref 1.4–7.7)
Neutrophils Relative %: 72.5 % (ref 43.0–77.0)
Platelets: 211 10*3/uL (ref 150.0–400.0)
RBC: 4.91 Mil/uL (ref 4.22–5.81)
RDW: 15.4 % (ref 11.5–15.5)
WBC: 8.5 10*3/uL (ref 4.0–10.5)

## 2016-08-14 LAB — ALBUMIN: Albumin: 4 g/dL (ref 3.5–5.2)

## 2016-08-14 LAB — PROTEIN, TOTAL: Total Protein: 7.2 g/dL (ref 6.0–8.3)

## 2016-08-14 LAB — GLUCOSE, RANDOM: GLUCOSE: 103 mg/dL — AB (ref 70–99)

## 2016-08-14 LAB — LACTATE DEHYDROGENASE: LDH: 174 U/L (ref 120–250)

## 2016-08-14 LAB — LIPASE: LIPASE: 30 U/L (ref 11.0–59.0)

## 2016-08-14 MED ORDER — UMECLIDINIUM BROMIDE 62.5 MCG/INH IN AEPB: 1.0000 | INHALATION_SPRAY | Freq: Every day | RESPIRATORY_TRACT | 0 refills | Status: DC

## 2016-08-14 NOTE — Progress Notes (Signed)
Subjective:     Patient ID: Jason Summers, male   DOB: [birth year], 83 y.o.   MRN: [id number]  HPI    IOV 09/07/2015  Chief Complaint  Patient presents with  . Pulmonary Consult    Referred by Dr. Johnsie Cancel - abnormal CT Chest.  Has pressure on left chest with deep inhalations.  No cough.       Jason Summers is  A 83 y.o. male is referred for pulmonary nodule/mass and also medially similar adenopathy. His smoking the past but only as a limited smoking history. He is an immigrant from [place]. 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 He now lives in [place]. His son lives nearby and apparently lives in the neighborhood. He presents with his wife [place]. He has been referred by cardiologist Dr. Leonides Sake. He has been having some atypical chest pain in the left precordial area. According to him and his wife this resulted in a d-dimer test that was slightly elevated. This resulted in a CT anginal chest that ruled out pulmonary embolism. I personally visualized the film. This then resulted in PET scan done 09/05/2015 that I personally visualized that shows a large left lower lobe subpleural mass along with a small lingular subpleural cavitating lesion that coincides with the area of the pain and also had heart mediastinal lymphatic adenopathy in the AP window. In addition there is a low uptake enlargement of pre-carinal and subcarinal lymphadenopathy. There is some weight loss history. He and his wife actually worried that this is lung cancer. His wife is in tears.   he is otherwise healthy. Chemistry evaluation 08/22/2015 was normal. His only blood thinner his aspirin daily. Echocardiogram yesterday 09/06/2015 was normal with ejection fraction 60%      has a past medical history of Arthritis; Spondylosis of cervical joint; Lateral epicondylitis (tennis elbow); Hemorrhoids; and Cancer ([place]).   reports that he quit smoking about 4  years ago. His smoking use included Cigarettes. He has a 12.4 pack-year smoking history. He has never used smokeless tobacco.   OV 08/14/2016  Chief Complaint  Patient presents with  . Follow-up    Pt states he has recently become more SOB and also c/o dry cough. Pt states he has mild pressure in mid chest when taking a deep breath.    Left lower lobe stage III lung cancer diagnosed a year ago. He now presents with his wife for follow-up. Review of the charts and summarization and talking to the patient and his wife suggests that he did get carboplatin and Taxol chemotherapy which she says ended by summer 2017. He also got radiation of the lung. He was then doing well and on follow-up. His ECOG is 0-1. He tells me that he did have some August 2017 CT chest followed by November 2017 CT chest. I personally visualize these films and these films suggest the onset of pleural effusion that was small in August 2017 and then progressed to small-moderate right November 2017. In association is also presence of baseline emphysema associated with some radiation lung injury. Now for the last month or 2 he's having exertional dyspnea relieved by rest. Although walking desaturation to 75 feet 3 laps on room air he did not desaturate. He also has some fatigue with his ECOG is still 0. He has not been exercising because of the bad winter weather. He got some mild cough with shortness of breath is the  real big issue. He did see Dr. Julien Nordmann oncology I visualized and reviewed and summarized these notes. He was treated with prednisone taper which didn't help his dyspnea but now that he is finished it  Dyspnea is back    has a past medical history of Arthritis; Cancer ([place]); Hemorrhoids; Lateral epicondylitis (tennis elbow); Non-small cell lung cancer (NSCLC) ([place]) (dx'f 07/30/15); Odynophagia (11/06/2015); and Spondylosis of cervical joint.   reports that he quit smoking about 5 years ago. His smoking use included  Cigarettes. He has a 12.40 pack-year smoking history. He has never used smokeless tobacco.  Past Surgical History:  Procedure Laterality Date  . COLONOSCOPY  2003  . KNEE ARTHROSCOPY  2006   right    Allergies  Allergen Reactions  . Gentamicin Other (See Comments)    Made eyes red, swollen, hot feeling  . Loteprednol-Tobramycin     Other reaction(s): Other (See Comments) Made eyes Red, Swollen, Red, Warm feeling     Immunization History  Administered Date(s) Administered  . Influenza Split 03/19/2011, 03/12/2012  . Influenza Whole 06/09/1997, 04/22/2007, 03/14/2008, 03/28/2009, 05/09/2010  . Influenza, High Dose Seasonal PF 04/14/2013, 04/11/2015  . Influenza,inj,Quad PF,36+ Mos 05/07/2016  . Influenza-Unspecified 04/10/2014  . Pneumococcal Conjugate-13 10/14/2013  . Pneumococcal Polysaccharide-23 06/09/2004, 10/04/2012  . Td 06/09/2005  . Zoster 02/04/2015    Family History  Problem Relation Age of Onset  . Hypertension Mother   . Heart disease Mother   . Coronary artery disease       Current Outpatient Prescriptions:  .  aspirin 81 MG EC tablet, Take 81 mg by mouth every other day. Reported on 10/16/2015, Disp: , Rfl:  .  Carboxymethylcellulose Sod PF (REFRESH PLUS) 0.5 % SOLN, Place 1 drop into both eyes 3 (three) times daily as needed., Disp: , Rfl:  .  esomeprazole (NEXIUM) 40 MG capsule, Take 1 capsule (40 mg total) by mouth daily at 12 noon., Disp: 90 capsule, Rfl: 3 .  fish oil-omega-3 fatty acids 1000 MG capsule, Take 2 g by mouth 2 (two) times daily.  , Disp: , Rfl:  .  glucosamine-chondroitin 500-400 MG tablet, Take 1 tablet by mouth 2 (two) times daily. Reported on 09/11/2015, Disp: , Rfl:    Review of Systems       Objective:   Physical Exam  Constitutional: He is oriented to person, place, and time. He appears well-developed and well-nourished. No distress.  HENT:  Head: Normocephalic and atraumatic.  Right Ear: External ear normal.  Left Ear:  External ear normal.  Mouth/Throat: Oropharynx is clear and moist. No oropharyngeal exudate.  Eyes: Conjunctivae and EOM are normal. Pupils are equal, round, and reactive to light. Right eye exhibits no discharge. Left eye exhibits no discharge. No scleral icterus.  Neck: Normal range of motion. Neck supple. No JVD present. No tracheal deviation present. No thyromegaly present.  Cardiovascular: Normal rate, regular rhythm and intact distal pulses.  Exam reveals no gallop and no friction rub.   No murmur heard. Pulmonary/Chest: Effort normal and breath sounds normal. No respiratory distress. He has no wheezes. He has no rales. He exhibits no tenderness.  Left sided air entry at base down  Abdominal: Soft. Bowel sounds are normal. He exhibits no distension and no mass. There is no tenderness. There is no rebound and no guarding.  Musculoskeletal: Normal range of motion. He exhibits no edema or tenderness.  Lymphadenopathy:    He has no cervical adenopathy.  Neurological: He is alert and oriented to  person, place, and time. He has normal reflexes. No cranial nerve deficit. Coordination normal.  Skin: Skin is warm and dry. No rash noted. He is not diaphoretic. No erythema. No pallor.  Psychiatric: He has a normal mood and affect. His behavior is normal. Judgment and thought content normal.  Nursing note and vitals reviewed.  Vitals:   08/14/16 0912  BP: 106/60  Pulse: 76  SpO2: 97%  Weight: 175 lb (79.4 kg)  Height: _0  (1.702 m)    Estimated body mass index is 27.41 kg/m as calculated from the following:   Height as of this encounter: _1  (1.702 m).   Weight as of this encounter: 175 lb (79.4 kg).     Assessment:       ICD-9-CM ICD-10-CM   1. Pleural effusion on left 511.9 J90   2. Other emphysema ([place]) 492.8 J43.8   3. 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 Shortness of breath 786.05 R06.02        Plan:     Pleural effusion on left New problem for  our office. Started in august 2017 as very small amont and now is small-moderate  The fluid removal will be done by Interventional radiology   - they will remove not more than 1.5L  - fluid labs to be sent: cell count, gram stain and culture, cytology for malignant cells, chemistries for LDH, albumin,  Protein, glucose, triglyceride and lipase  - blood labs to be sent 08/14/2016 - : cbc, ldh, protein, albumin glucose, and lipase   Other emphysema ([place]) thre is mild emphysema in lung. 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 Brand Males, M.D., [facility].C.P Pulmonary and Critical Care Medicine Staff Physician [place] [facility] Pager: [phone], If no answer or between  15:00h - 7:00h: call [phone]  08/14/2016 10:03 AM

## 2016-08-14 NOTE — Assessment & Plan Note (Signed)
New problem for our office. Started in august 2017 as very small amont and now is small-moderate  The fluid removal will be done by Interventional radiology   - they will remove not more than 1.5L  - fluid labs to be sent: cell count, gram stain and culture, cytology for malignant cells, chemistries for LDH, albumin,  Protein, glucose, triglyceride and lipase  - blood labs to be sent 08/14/2016 - : cbc, ldh, protein, albumin glucose, and lipase

## 2016-08-14 NOTE — Patient Instructions (Signed)
Pleural effusion on left New problem for our office. Started in august 2017 as very small amont and now is small-moderate  The fluid removal will be done by Interventional radiology   - they will remove not more than 1.5L  - fluid labs to be sent: cell count, gram stain and culture, cytology for malignant cells, chemistries for LDH, albumin,  Protein, glucose, triglyceride and lipase  - blood labs to be sent 08/14/2016 - : cbc, ldh, protein, albumin glucose, and lipase   Other emphysema ([place]) thre is mild emphysema in lung. 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

## 2016-08-14 NOTE — Assessment & Plan Note (Signed)
This is from several reasons Good news  o2 level stayed good with walking Let us reassess this at followup to make further inroads to helping you  FOllowup 3 weeks with me or APP

## 2016-08-14 NOTE — Assessment & Plan Note (Signed)
thre is mild emphysema in lung. Not sure how muc this is contributing to currrent shortness of breath  Plan Start tudorza or incruse or bevespi or spiriva samples - talke samples for few weeks At some point need referral to pulmonary rehabilitation

## 2016-08-14 NOTE — Assessment & Plan Note (Signed)
There might be this going on too inlung on CT fev 2018 IF so will explain why prednisone helped you Will address this at followup depending on Rx of other issues

## 2016-08-15 ENCOUNTER — Ambulatory Visit (HOSPITAL_COMMUNITY)
Admission: RE | Admit: 2016-08-15 | Discharge: 2016-08-15 | Disposition: A | Discharge status: Home / Self Care | Payer: Medicare Other | Source: Ambulatory Visit | Attending: Radiology | Admitting: Radiology

## 2016-08-15 ENCOUNTER — Ambulatory Visit (HOSPITAL_COMMUNITY)
Admission: RE | Admit: 2016-08-15 | Discharge: 2016-08-15 | Disposition: A | Discharge status: Home / Self Care | Payer: Medicare Other | Source: Ambulatory Visit | Attending: Internal Medicine | Admitting: Internal Medicine

## 2016-08-15 DIAGNOSIS — Z9889 Other specified postprocedural states: Secondary | ICD-10-CM

## 2016-08-15 DIAGNOSIS — J90 Pleural effusion, not elsewhere classified: Secondary | ICD-10-CM | POA: Diagnosis not present

## 2016-08-15 LAB — GLUCOSE, PLEURAL OR PERITONEAL FLUID: Glucose, Fluid: 100 mg/dL

## 2016-08-15 LAB — BODY FLUID CELL COUNT WITH DIFFERENTIAL
EOS FL: 0 %
Lymphs, Fluid: 73 %
MONOCYTE-MACROPHAGE-SEROUS FLUID: 22 % — AB (ref 50–90)
Neutrophil Count, Fluid: 5 % (ref 0–25)
Total Nucleated Cell Count, Fluid: 3876 cu mm — ABNORMAL HIGH (ref 0–1000)

## 2016-08-15 NOTE — Procedures (Signed)
Ultrasound-guided diagnostic and therapeutic left  thoracentesis performed yielding 500 cc  of turbid,amber colored fluid. No immediate complications. Follow-up chest x-ray pending. The fluid was sent to the lab for preordered studies.

## 2016-08-16 LAB — GRAM STAIN: Gram Stain: NONE SEEN

## 2016-08-16 LAB — TRIGLYCERIDES, BODY FLUIDS: Triglycerides, Fluid: 10 mg/dL

## 2016-08-20 LAB — CULTURE, BODY FLUID-BOTTLE: CULTURE: NO GROWTH

## 2016-08-20 LAB — CULTURE, BODY FLUID W GRAM STAIN -BOTTLE

## 2016-08-26 ENCOUNTER — Telehealth: Payer: Self-pay | Admitting: Internal Medicine

## 2016-08-26 NOTE — Telephone Encounter (Signed)
Please let Manley Madonia know that CT fluid shws inflammation that is chronic (maybe radioation rlated). No cancer cells seen - this does not mean the fluid is not from cancer but just means chances fluid is from cancer is low. IF he is still not feeling well: would recommend  Prednisone 36m per day atleast till he sees Tammy 09/04/16 for fu  Dr. MBrand Males M.D., [facility].P Pulmonary and Critical Care Medicine Staff Physician [facility] Pager: [phone] If no answer or between  15:00h - 7:00h: call [phone]  08/26/2016 5:52 PM

## 2016-08-27 NOTE — Telephone Encounter (Signed)
Called and spoke to pt. Informed him of the results and recs per MR. Pt states he is feeling better and does not need the pred. Pt aware to keep appt with TP. Nothing further needed at this time.

## 2016-09-04 ENCOUNTER — Encounter: Payer: Self-pay | Admitting: Adult Health

## 2016-09-04 ENCOUNTER — Ambulatory Visit (INDEPENDENT_AMBULATORY_CARE_PROVIDER_SITE_OTHER): Payer: Medicare Other | Admitting: Adult Health

## 2016-09-04 ENCOUNTER — Ambulatory Visit (INDEPENDENT_AMBULATORY_CARE_PROVIDER_SITE_OTHER)
Admission: RE | Admit: 2016-09-04 | Discharge: 2016-09-04 | Disposition: A | Discharge status: Home / Self Care | Payer: Medicare Other | Source: Ambulatory Visit | Attending: Adult Health | Admitting: Adult Health

## 2016-09-04 VITALS — BP 114/64 | HR 75 | Ht 66.0 in | Wt 175.4 lb

## 2016-09-04 DIAGNOSIS — J701 Chronic and other pulmonary manifestations due to radiation: Secondary | ICD-10-CM

## 2016-09-04 DIAGNOSIS — J90 Pleural effusion, not elsewhere classified: Secondary | ICD-10-CM | POA: Diagnosis not present

## 2016-09-04 DIAGNOSIS — J438 Other emphysema: Secondary | ICD-10-CM

## 2016-09-04 MED ORDER — UMECLIDINIUM BROMIDE 62.5 MCG/INH IN AEPB: 1.0000 | INHALATION_SPRAY | Freq: Every day | RESPIRATORY_TRACT | 0 refills | Status: DC

## 2016-09-04 MED ORDER — UMECLIDINIUM BROMIDE 62.5 MCG/INH IN AEPB: 1.0000 | INHALATION_SPRAY | Freq: Every day | RESPIRATORY_TRACT | 1 refills | Status: DC

## 2016-09-04 NOTE — Assessment & Plan Note (Signed)
Radiation changes in Lung , hard to say if he has radiation pneumonitis at this point . He is improved from throacentesis and INCRUSE. He does not have significant coughing . Will hold on steroids at this time . If sx persistent or worsen on xray then can challenge with steroids .

## 2016-09-04 NOTE — Patient Instructions (Signed)
Continue on Incruse. daily  follow up Dr. Chase Caller in 6 weeks and As needed   Please contact office for sooner follow up if symptoms do not improve or worsen or seek emergency care

## 2016-09-04 NOTE — Addendum Note (Signed)
Addended by: Parke Poisson E on: 09/04/2016 03:47 PM   Modules accepted: Orders

## 2016-09-04 NOTE — Progress Notes (Signed)
_0  ID: Jason Summers, male    DOB: 16-Oct-1931, 83 y.o.   MRN: [id number]  Chief Complaint  Patient presents with  . 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 Patient has underlying lung cancer that was diagnosed in 2017. 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 Patient is followed by Dr. Inda Merlin. He underwent chemotherapy and radiation.  Follow-up CT chest in November 2017 showed post treatment changes in the left upper mid lung. With a moderate left pleural effusion. There was ground glass consolidation and bronchiectasis bilaterally ? Fibrosis . He was treated with steroids for possible radiation pneumonitis . Follow-up CT chest February 2018 showed emphysematous changes, pulmonary scarring and peripheral fibrosis. There was dense scarring involving both lungs and a persistent left-sided pleural effusion.  Patient was seen in the office with Dr. Chase Caller and set up for a ultrasound-guided thoracentesis. 100 cc were removed. Cytology was negative for malignant cells.  He was started on INCRUSE  daily .  Since last visit. Patient says he is feeling better. He does have decreased shortness of breath. His energy level and activity level has not returned to baseline but has improved some. Eyes any significant cough. He denies any fever, discolored mucus hemoptysis, chest pain, orthopnea, PND, or increased leg swelling or calf pain. Chest x-ray today shows a small residual left pleural effusion and chronic lung changes.   Allergies  Allergen Reactions  . Gentamicin Other (See Comments)    Made eyes red, swollen, hot feeling  .  Loteprednol-Tobramycin     Other reaction(s): Other (See Comments) Made eyes Red, Swollen, Red, Warm feeling     Immunization History  Administered Date(s) Administered  . Influenza Split 03/19/2011, 03/12/2012  . Influenza Whole 06/09/1997, 04/22/2007, 03/14/2008, 03/28/2009, 05/09/2010  . Influenza, High Dose Seasonal PF 04/14/2013, 04/11/2015  . Influenza,inj,Quad PF,36+ Mos 05/07/2016  . Influenza-Unspecified 04/10/2014  . Pneumococcal Conjugate-13 10/14/2013  . Pneumococcal Polysaccharide-23 06/09/2004, 10/04/2012  . Td 06/09/2005  . Zoster 02/04/2015    Past Medical History:  Diagnosis Date  . Arthritis   . Cancer ([place])    basal cell on right temple  . Hemorrhoids   . Lateral epicondylitis (tennis elbow)   . Non-small cell lung cancer (NSCLC) ([place]) dx'f 07/30/15  . Odynophagia 11/06/2015  . Spondylosis of cervical joint     Tobacco History: History  Smoking Status  . Former Smoker  . Packs/day: 0.20  . Years: 62.00  . Types: Cigarettes  . Quit date: 03/10/2011  Smokeless Tobacco  . Never Used    Comment: smokes 1 ciagettes occasionally   Counseling given: Not Answered   Outpatient Encounter Prescriptions as of 09/04/2016  Medication Sig  . aspirin 81 MG EC tablet Take 81 mg by mouth every other day. Reported on 10/16/2015  . esomeprazole (NEXIUM) 40 MG capsule Take 1 capsule (40 mg total) by mouth daily at 12 noon.  . fish oil-omega-3 fatty acids 1000 MG capsule Take 2 g by mouth 2 (two) times daily.    Marland Kitchen glucosamine-chondroitin 500-400 MG tablet Take 1 tablet by mouth 2 (two) times daily. Reported on 09/11/2015  . umeclidinium bromide (INCRUSE ELLIPTA) 62.5 MCG/INH AEPB Inhale 1  puff into the lungs daily.  . [DISCONTINUED] Carboxymethylcellulose Sod PF (REFRESH PLUS) 0.5 % SOLN Place 1 drop into both eyes 3 (three) times daily as needed.   No facility-administered encounter medications on file as of 09/04/2016.      Review of Systems  Constitutional:   No   weight loss, night sweats,  Fevers, chills, fatigue, or  lassitude.  HEENT:   No headaches,  Difficulty swallowing,  Tooth/dental problems, or  Sore throat,                No sneezing, itching, ear ache, nasal congestion, post nasal drip,   CV:  No chest pain,  Orthopnea, PND, swelling in lower extremities, anasarca, dizziness, palpitations, syncope.   GI  No heartburn, indigestion, abdominal pain, nausea, vomiting, diarrhea, change in bowel habits, loss of appetite, bloody stools.   Resp: No shortness of breath with exertion or at rest.  No excess mucus, no productive cough,  No non-productive cough,  No coughing up of blood.  No change in color of mucus.  No wheezing.  No chest wall deformity  Skin: no rash or lesions.  GU: no dysuria, change in color of urine, no urgency or frequency.  No flank pain, no hematuria   MS:  No joint pain or swelling.  No decreased range of motion.  No back pain.    Physical Exam  BP 114/64 (BP Location: Left Arm, Cuff Size: Normal)   Pulse 75   Ht 5' 6" (1.676 m)   Wt 175 lb 6.4 oz (79.6 kg)   SpO2 96%   BMI 28.31 kg/m   GEN: A/Ox3; pleasant , NAD, well nourished    HEENT:  [place]/AT,  EACs-clear, TMs-wnl, NOSE-clear, THROAT-clear, no lesions, no postnasal drip or exudate noted.   NECK:  Supple w/ fair ROM; no JVD; normal carotid impulses w/o bruits; no thyromegaly or nodules palpated; no lymphadenopathy.    RESP  Clear  P & A; w/o, wheezes/ rales/ or rhonchi. no accessory muscle use, no dullness to percussion  CARD:  RRR, no m/r/g, no peripheral edema, pulses intact, no cyanosis or clubbing.  GI:   Soft & nt; nml bowel sounds; no organomegaly or masses detected.   Musco: Warm bil, no deformities or joint swelling noted.   Neuro: alert, no focal deficits noted.    Skin: Warm, no lesions or rashes  Psych:  No change in mood or affect. No depression or anxiety.  No memory loss.  Lab Results:  CBC    Component Value Date/Time   WBC 8.5  08/14/2016 1040   RBC 4.91 08/14/2016 1040   HGB 14.3 08/14/2016 1040   HGB 14.0 08/05/2016 0906   HCT 42.3 08/14/2016 1040   HCT 41.6 08/05/2016 0906   PLT 211.0 08/14/2016 1040   PLT 188 08/05/2016 0906   MCV 86.2 08/14/2016 1040   MCV 86.9 08/05/2016 0906   MCH 29.2 08/05/2016 0906   MCH 28.4 09/13/2015 0941   MCHC 33.8 08/14/2016 1040   RDW 15.4 08/14/2016 1040   RDW 15.1 (H) 08/05/2016 0906   LYMPHSABS 1.3 08/14/2016 1040   LYMPHSABS 1.2 08/05/2016 0906   MONOABS 0.8 08/14/2016 1040   MONOABS 0.9 08/05/2016 0906   EOSABS 0.2 08/14/2016 1040   EOSABS 0.2 08/05/2016 0906   BASOSABS 0.1 08/14/2016 1040   BASOSABS 0.1 08/05/2016 0906    BMET    Component Value Date/Time   NA 138 08/05/2016 0906   K 4.6 08/05/2016 0906  CL 103 08/22/2015 1443   CO2 25 08/05/2016 0906   GLUCOSE 103 (H) 08/14/2016 1040   GLUCOSE 95 08/05/2016 0906   BUN 15.5 08/05/2016 0906   CREATININE 0.9 08/05/2016 0906   CALCIUM 9.1 08/05/2016 0906   GFRNONAA 98.36 05/07/2010 1040   GFRAA 106 02/21/2008 0000    BNP    Component Value Date/Time   BNP 52.0 08/21/2015 1206    ProBNP No results found for: PROBNP  Imaging: Dg Chest 1 View  Result Date: 08/15/2016 CLINICAL DATA:  83 year old male status post left side ultrasound-guided thoracentesis today. Lung cancer. EXAM: CHEST 1 VIEW COMPARISON:  Chest CT 08/05/2016 and earlier. FINDINGS: No left pneumothorax. Small residual left pleural effusion suspected. Multifocal Spiculated opacity and architectural distortion about both hila, worse on the left, likely radiation sequelae. No pulmonary edema. Patchy reticular opacity at the right lung base appears mostly chronic. Visualized tracheal air column is within normal limits. Negative visible bowel gas pattern. No acute osseous abnormality identified. IMPRESSION: 1. No pneumothorax following left thoracentesis. 2. Chronic and post radiation changes to the lungs. No new cardiopulmonary abnormality.  Electronically Signed   By: Genevie Ann M.D.   On: 08/15/2016 12:27   Dg Chest 2 View  Result Date: 09/04/2016 CLINICAL DATA:  Follow-up left pleural effusion. No current complaints. History of left lung malignancy, former smoker. EXAM: CHEST  2 VIEW COMPARISON:  Portable chest x-ray of August 15, 2016 FINDINGS: There remains a small left pleural effusion which blunts the lateral and posterior costophrenic angles. This has not appreciably increased in size since the previous study. The interstitial markings of both lungs remain increased and are focally increased in the left upper lower lobe and at the right lung base. These findings however are stable. The heart is normal in size. The pulmonary vascularity is not engorged. There is calcification in the wall of the aortic arch. The bony thorax exhibits no acute abnormality. IMPRESSION: Stable small left pleural effusion. Stable increased lung markings bilaterally consistent with previous radiation changes. No acute pneumonia nor CHF. Thoracic aortic atherosclerosis. Electronically Signed   By: David  Martinique M.D.   On: 09/04/2016 14:50   US Thoracentesis Asp Pleural Space W/img Guide  Result Date: 08/15/2016 INDICATION: Lung cancer, dyspnea, left pleural effusion. Request made for diagnostic and therapeutic left thoracentesis. EXAM: ULTRASOUND GUIDED DIAGNOSTIC AND THERAPEUTIC LEFT THORACENTESIS MEDICATIONS: None. COMPLICATIONS: None immediate. PROCEDURE: An ultrasound guided thoracentesis was thoroughly discussed with the patient and questions answered. The benefits, risks, alternatives and complications were also discussed. The patient understands and wishes to proceed with the procedure. Written consent was obtained. Ultrasound was performed to localize and mark an adequate pocket of fluid in the left chest. The area was then prepped and draped in the normal sterile fashion. 1% Lidocaine was used for local anesthesia. Under ultrasound guidance a Safe-T-Centesis  catheter was introduced. Thoracentesis was performed. The catheter was removed and a dressing applied. FINDINGS: A total of approximately 500 cc of turbid, amber fluid was removed. Samples were sent to the laboratory as requested by the clinical team. IMPRESSION: Successful ultrasound guided diagnostic and therapeutic left thoracentesis yielding 500 cc of pleural fluid. Read by: Rowe Robert, PA-C Electronically Signed   By: Aletta Edouard M.D.   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 He is improved from throacentesis and INCRUSE.  He does not have significant coughing . Will hold on steroids at this time . If sx persistent or worsen on xray then can challenge with steroids .    Pleural effusion on left Left effusion s/p thoracentesis w/ neg cytology . No reacculmulation at this time.  Explained that no malignant cells - will need to continue to follow closely as does not totally r/o recurrent/progressive dz.   Other emphysema ([place]) Cont on INCRUSE.      Rexene Edison, NP 09/04/2016

## 2016-09-04 NOTE — Progress Notes (Signed)
Patient seen in the office today and re-instructed on use of Incruse.  Patient expressed understanding and demonstrated technique. Parke Poisson, CMA 09/04/16

## 2016-09-04 NOTE — Assessment & Plan Note (Signed)
Left effusion s/p thoracentesis w/ neg cytology . No reacculmulation at this time.  Explained that no malignant cells - will need to continue to follow closely as does not totally r/o recurrent/progressive dz.

## 2016-09-04 NOTE — Assessment & Plan Note (Signed)
Cont on INCRUSE.

## 2016-09-09 ENCOUNTER — Telehealth: Payer: Self-pay | Admitting: Adult Health

## 2016-09-09 NOTE — Telephone Encounter (Signed)
Spoke with pt's wife, states that [employer] told patient that a PA is required for Incruse. PA initiated through [email or web address] KEY: [id number] This has been sent to plan- response expected within 1-5 business days.    Will forward to [facility] for follow up.

## 2016-09-10 NOTE — Telephone Encounter (Signed)
Patient wife called back - states that she spoke with [employer] and the alternative to Incruse that is covered is [place] Spiriva, and Atrovent - pt wife can be reached at [phone] -pr

## 2016-09-10 NOTE — Telephone Encounter (Signed)
Can use Spiriva Respimat 2 puffs daily .

## 2016-09-10 NOTE — Telephone Encounter (Signed)
PA already initiated  Now spouse is calling with alternatives for Incruse: atrovent, spiriva, and Tudorza  TP, please advise if any of these are appropriate, thanks

## 2016-09-11 MED ORDER — TIOTROPIUM BROMIDE MONOHYDRATE 2.5 MCG/ACT IN AERS: 2.0000 | INHALATION_SPRAY | Freq: Every day | RESPIRATORY_TRACT | 3 refills | Status: DC

## 2016-09-11 NOTE — Telephone Encounter (Signed)
lmtcb for pt's wife. Will speak with pt/pt's wife before confirming this change with [employer].

## 2016-09-11 NOTE — Telephone Encounter (Signed)
Spoke with the pt's spouse and notified of med change and she is okay with this  [employer] and spoke with Anderson Malta and gave verbal order for Spiriva 2.5  Nothing further needed

## 2016-09-11 NOTE — Telephone Encounter (Signed)
Spiriva 2.5 is fine .

## 2016-09-11 NOTE — Telephone Encounter (Signed)
Tasherra with [employer], CB is [phone]. Reference [id number]. I read her the notes that patient will be using Spiriva, she is asking nurse call her.

## 2016-09-11 NOTE — Telephone Encounter (Signed)
[phone] wife calling back

## 2016-09-11 NOTE — Telephone Encounter (Signed)
TP, what dosage would you like 1.25 or 2.5?

## 2016-09-16 ENCOUNTER — Ambulatory Visit (HOSPITAL_COMMUNITY)
Admission: RE | Admit: 2016-09-16 | Discharge: 2016-09-16 | Disposition: A | Discharge status: Home / Self Care | Payer: Medicare Other | Source: Ambulatory Visit | Attending: Internal Medicine | Admitting: Internal Medicine

## 2016-09-16 DIAGNOSIS — G4452 New daily persistent headache (NDPH): Secondary | ICD-10-CM

## 2016-09-16 DIAGNOSIS — C3492 Malignant neoplasm of unspecified part of left bronchus or lung: Secondary | ICD-10-CM | POA: Insufficient documentation

## 2016-09-16 DIAGNOSIS — R9089 Other abnormal findings on diagnostic imaging of central nervous system: Secondary | ICD-10-CM | POA: Insufficient documentation

## 2016-09-16 MED ORDER — GADOBENATE DIMEGLUMINE 529 MG/ML IV SOLN
20.0000 mL | Freq: Once | INTRAVENOUS | Status: AC | PRN
  Administered 2016-09-16: 16 mL via INTRAVENOUS

## 2016-09-18 ENCOUNTER — Telehealth: Payer: Self-pay | Admitting: Medical Oncology

## 2016-09-18 NOTE — Telephone Encounter (Signed)
requests mri of brain

## 2016-09-18 NOTE — Telephone Encounter (Signed)
It was stable. No change.

## 2016-09-19 ENCOUNTER — Telehealth: Payer: Self-pay | Admitting: Medical Oncology

## 2016-09-19 NOTE — Telephone Encounter (Signed)
Per [facility] MRI " .. was stable. No change." Wife notified.

## 2016-10-16 ENCOUNTER — Ambulatory Visit (INDEPENDENT_AMBULATORY_CARE_PROVIDER_SITE_OTHER): Payer: Medicare Other | Admitting: Internal Medicine

## 2016-10-16 ENCOUNTER — Ambulatory Visit (INDEPENDENT_AMBULATORY_CARE_PROVIDER_SITE_OTHER)
Admission: RE | Admit: 2016-10-16 | Discharge: 2016-10-16 | Disposition: A | Discharge status: Home / Self Care | Payer: Medicare Other | Source: Ambulatory Visit | Attending: Internal Medicine | Admitting: Internal Medicine

## 2016-10-16 ENCOUNTER — Encounter: Payer: Self-pay | Admitting: Internal Medicine

## 2016-10-16 ENCOUNTER — Telehealth: Payer: Self-pay | Admitting: Internal Medicine

## 2016-10-16 DIAGNOSIS — J90 Pleural effusion, not elsewhere classified: Secondary | ICD-10-CM

## 2016-10-16 DIAGNOSIS — J439 Emphysema, unspecified: Secondary | ICD-10-CM | POA: Diagnosis not present

## 2016-10-16 MED ORDER — UMECLIDINIUM BROMIDE 62.5 MCG/INH IN AEPB: 1.0000 | INHALATION_SPRAY | Freq: Every day | RESPIRATORY_TRACT | 11 refills | Status: DC

## 2016-10-16 NOTE — Assessment & Plan Note (Signed)
Stable emphysema but you are not getting relief from spiriva due to technical issues with inhaler  Plan = change spiriva to incruse ellipta - take sample; if pricey let us know - use albuterol as needed

## 2016-10-16 NOTE — Assessment & Plan Note (Signed)
In march 2018 was likely reactive to inflammation from radiation Glad you are feeling well and stable  Plan - reassess with cxr 2 view 10/16/2016  Followup End aug 2018/early sept 2018

## 2016-10-16 NOTE — Progress Notes (Signed)
Subjective:     Patient ID: Jason Summers, male   DOB: [birth year], 83 y.o.   MRN: [id number]  HPI     OV 10/16/2016  Chief Complaint  Patient presents with  . Follow-up    Pt states his breathing has improved since last OV. Pt states the Spiriva respimat is very difficult to use and do not want to continue. Pt c/o dry cough in morning and c/o chest discomfort when over exerting himself - resolves with rest.       83 year old male. I saw him in March 2018 for left-sided pleural effusion associated with radiation pneumonitis in the setting of post lung cancer treatment. She underwent thoracentesis under cc which was exudative but nondiagnostic malignancy. After that he is felt well. He continues to be stable and doing well. He's planning a trip to [place] in June 2019 for 1 month. Last chest x-ray 09/04/2016 that showed small left-sided pleural effusion with stable radiation changes.  Other issues emphysema: Seen on CT chest. He is active and only minimally symptomatic. He has used Spiriva but he does not like the inhaler technique and wants a replacement. There is no evidence of exacerbation.    has a past medical history of Arthritis; Cancer ([place]); Hemorrhoids; Lateral epicondylitis (tennis elbow); Non-small cell lung cancer (NSCLC) ([place]) (dx'f 07/30/15); Odynophagia (11/06/2015); and Spondylosis of cervical joint.   reports that he quit smoking about 5 years ago. His smoking use included Cigarettes. He has a 12.40 pack-year smoking history. He has never used smokeless tobacco.  Past Surgical History:  Procedure Laterality Date  . COLONOSCOPY  2003  . KNEE ARTHROSCOPY  2006   right    Allergies  Allergen Reactions  . Gentamicin Other (See Comments)    Made eyes red, swollen, hot feeling  . Loteprednol-Tobramycin     Other reaction(s): Other (See Comments) Made eyes Red, Swollen, Red, Warm feeling     Immunization History  Administered Date(s) Administered  . Influenza Split  03/19/2011, 03/12/2012  . Influenza Whole 06/09/1997, 04/22/2007, 03/14/2008, 03/28/2009, 05/09/2010  . Influenza, High Dose Seasonal PF 04/14/2013, 04/11/2015  . Influenza,inj,Quad PF,36+ Mos 05/07/2016  . Influenza-Unspecified 04/10/2014  . Pneumococcal Conjugate-13 10/14/2013  . Pneumococcal Polysaccharide-23 06/09/2004, 10/04/2012  . Td 06/09/2005  . Zoster 02/04/2015    Family History  Problem Relation Age of Onset  . Hypertension Mother   . Heart disease Mother   . Coronary artery disease Unknown      Current Outpatient Prescriptions:  .  aspirin 81 MG EC tablet, Take 81 mg by mouth every other day. Reported on 10/16/2015, Disp: , Rfl:  .  esomeprazole (NEXIUM) 40 MG capsule, Take 1 capsule (40 mg total) by mouth daily at 12 noon., Disp: 90 capsule, Rfl: 3 .  fish oil-omega-3 fatty acids 1000 MG capsule, Take 2 g by mouth daily. , Disp: , Rfl:  .  glucosamine-chondroitin 500-400 MG tablet, Take 1 tablet by mouth 2 (two) times daily. Reported on 09/11/2015, Disp: , Rfl:  .  Tiotropium Bromide Monohydrate (SPIRIVA RESPIMAT) 2.5 MCG/ACT AERS, Inhale 2 puffs into the lungs daily., Disp: 3 Inhaler, Rfl: 3    Review of Systems     Objective:   Physical Exam  Constitutional: He is oriented to person, place, and time. He appears well-developed and well-nourished. No distress.  HENT:  Head: Normocephalic and atraumatic.  Right Ear: External ear normal.  Left Ear: External ear normal.  Mouth/Throat: Oropharynx is clear and moist. No oropharyngeal exudate.  Eyes:  Conjunctivae and EOM are normal. Pupils are equal, round, and reactive to light. Right eye exhibits no discharge. Left eye exhibits no discharge. No scleral icterus.  Neck: Normal range of motion. Neck supple. No JVD present. No tracheal deviation present. No thyromegaly present.  Cardiovascular: Normal rate, regular rhythm and intact distal pulses.  Exam reveals no gallop and no friction rub.   No murmur  heard. Pulmonary/Chest: Effort normal and breath sounds normal. No respiratory distress. He has no wheezes. He has no rales. He exhibits no tenderness.  Reduced air entry in the left  Abdominal: Soft. Bowel sounds are normal. He exhibits no distension and no mass. There is no tenderness. There is no rebound and no guarding.  Musculoskeletal: Normal range of motion. He exhibits no edema or tenderness.  Lymphadenopathy:    He has no cervical adenopathy.  Neurological: He is alert and oriented to person, place, and time. He has normal reflexes. No cranial nerve deficit. Coordination normal.  Skin: Skin is warm and dry. No rash noted. He is not diaphoretic. No erythema. No pallor.  Psychiatric: He has a normal mood and affect. His behavior is normal. Judgment and thought content normal.  Nursing note and vitals reviewed.   Vitals:   10/16/16 0859  BP: 138/62  Pulse: 81  SpO2: 97%  Weight: 176 lb 9.6 oz (80.1 kg)  Height: 5' 6" (1.676 m)    Estimated body mass index is 28.5 kg/m as calculated from the following:   Height as of this encounter: 5' 6" (1.676 m).   Weight as of this encounter: 176 lb 9.6 oz (80.1 kg).       Assessment:       ICD-9-CM ICD-10-CM   1. Pulmonary emphysema, unspecified emphysema type ([place]) 492.8 J43.9   2. Pleural effusion on left 511.9 J90        Plan:     Pulmonary emphysema ([facility]) Stable emphysema but you are not getting relief from spiriva due to technical issues with inhaler  Plan = change spiriva to incruse ellipta - take sample; if pricey let us know - use albuterol as needed  Pleural effusion on left In march 2018 was likely reactive to inflammation from radiation Glad you are feeling well and stable  Plan - reassess with cxr 2 view 10/16/2016  Followup End aug 2018/early sept 2018    Dr. Brand Males, M.D., [facility].C.P Pulmonary and Critical Care Medicine Staff Physician [facility] Pager: [phone], If no answer or between  15:00h - 7:00h: call [phone]  10/16/2016 9:23 AM

## 2016-10-16 NOTE — Patient Instructions (Signed)
Pulmonary emphysema ([facility]) Stable emphysema but you are not getting relief from spiriva due to technical issues with inhaler  Plan = change spiriva to incruse ellipta - take sample; if pricey let us know - use albuterol as needed  Pleural effusion on left In march 2018 was likely reactive to inflammation from radiation Glad you are feeling well and stable  Plan - reassess with cxr 2 view 10/16/2016  Followup End aug 2018/early sept 2018

## 2016-10-16 NOTE — Telephone Encounter (Signed)
Fax has not been received yet.  Will hold to follow up on PA form that was being faxed back [employer].

## 2016-10-17 NOTE — Telephone Encounter (Signed)
Attempted to reach someone at [employer]-unable to get through to the correct dept. Fax was rec'd for PA request and completed. I have given to EE to have MR sign and fax back. Will forward message to EE to follow up on PA status.

## 2016-10-17 NOTE — Telephone Encounter (Signed)
This has been signed and faxed back to [facility], will await decision.

## 2016-10-17 NOTE — Progress Notes (Signed)
Called and spoke to pt's wife. Informed her of the results and recs per MR. She verbalized understanding and states she would relay results to pt and denied any further questions or concerns at this time.

## 2016-10-23 NOTE — Telephone Encounter (Signed)
I have not received any updates on pt's med. Will need to call [employer].

## 2016-10-27 NOTE — Telephone Encounter (Signed)
Patient's wife called back and was made aware of the Incruse being approved.  No call back is needed.

## 2016-10-27 NOTE — Telephone Encounter (Signed)
Contacted [employer] [phone] pt ref# [id number] PA was approved with dates 09/16/16-10/16/2017   ATC pt x1 left vm to make him aware of approval  Spoke with the pharmacy who stated that they are aware and that the pt has already picked up the medications. So if pt calls back I guess we can just make sure he doesn't need anything further

## 2016-11-11 ENCOUNTER — Ambulatory Visit (INDEPENDENT_AMBULATORY_CARE_PROVIDER_SITE_OTHER): Payer: Medicare Other | Admitting: Pulmonary Disease

## 2016-11-11 ENCOUNTER — Encounter: Payer: Self-pay | Admitting: Pulmonary Disease

## 2016-11-11 ENCOUNTER — Telehealth: Payer: Self-pay | Admitting: Pulmonary Disease

## 2016-11-11 ENCOUNTER — Ambulatory Visit (INDEPENDENT_AMBULATORY_CARE_PROVIDER_SITE_OTHER)
Admission: RE | Admit: 2016-11-11 | Discharge: 2016-11-11 | Disposition: A | Discharge status: Home / Self Care | Payer: Medicare Other | Source: Ambulatory Visit | Attending: Pulmonary Disease | Admitting: Pulmonary Disease

## 2016-11-11 VITALS — BP 122/74 | HR 89 | Ht 68.0 in | Wt 172.6 lb

## 2016-11-11 DIAGNOSIS — C3492 Malignant neoplasm of unspecified part of left bronchus or lung: Secondary | ICD-10-CM

## 2016-11-11 DIAGNOSIS — J90 Pleural effusion, not elsewhere classified: Secondary | ICD-10-CM

## 2016-11-11 DIAGNOSIS — J701 Chronic and other pulmonary manifestations due to radiation: Secondary | ICD-10-CM

## 2016-11-11 NOTE — Patient Instructions (Signed)
We will review for a chest x-ray to evaluate any examination of fluid or worsening of lung opacities If abnormal then we may need to do a CT of the chest Continue to use over-the-counter Tylenol, Motrin for pain relief Use warm compresses.  Return to clinic with Dr. Chase Caller or sooner if symptoms worsen.

## 2016-11-11 NOTE — Telephone Encounter (Signed)
I have reviewed the CXR. It shows that the pleural effusion and scarring is stable compared to prior CXR. Continue treatment as discussed in office. He can get the CT as scheduled later this year for follow up of the effusion and lung scarring.   PM

## 2016-11-11 NOTE — Telephone Encounter (Signed)
Pt wife calling for cxr results. Please advise Dr Vaughan Browner. Thanks.   IMPRESSION: Persistent left pleural effusion. Multifocal areas of pulmonary fibrosis. Areas of patchy airspace opacity on the left remain. No new opacity is appreciable. It should be noted that these areas of airspace opacity could be due to scarring. A degree of pneumonia or atypical neoplasm cannot be excluded radiographically. These findings warrant continued clinical and imaging surveillance. Correlation with noncontrast enhanced CT to more directly compare stability of these areas of opacity on the left compared to prior study may be a reasonable consideration. Cardiac silhouette is stable. No adenopathy is appreciable by radiography.

## 2016-11-11 NOTE — Telephone Encounter (Signed)
Spoke with pt's wife (dpr on file), aware of results/recs.  Nothing further needed.

## 2016-11-11 NOTE — Progress Notes (Signed)
Jason Summers    [id number]    01-Oct-1931  Primary Care Physician:Kwiatkowski, Doretha Sou, MD  Referring Physician: [name], MD [name], [place] [address]  Chief complaint:  Acute visit for chest discomfort  HPI: 83 year old with history of COPD, lung cancer, left exudate pleural effusion thought to be secondary to radiation pneumonitis. 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 Noted to have a moderate effusion on the left in nov 2017. This treated with steroids and a thoracentesis which was exudative in nature. Cytology was negative for malignant cells. He has been maintained on incruse and recent chest x-ray does not show reaccumulation of pleural effusion.  He returns to office today with complaints of  bilateral middle and upper back pain for the past 2 weeks. He has nonproductive cough with nasal drainage, dyspnea on exertion which is at baseline. Denies any hemoptysis, fevers, chills, wheezing.  Outpatient Encounter Prescriptions as of 11/11/2016  Medication Sig  . aspirin 81 MG EC tablet Take 81 mg by mouth every other day. Reported on 10/16/2015  . esomeprazole (NEXIUM) 40 MG capsule Take 1 capsule (40 mg total) by mouth daily at 12 noon.  . fish oil-omega-3 fatty acids 1000 MG capsule Take 2 g by mouth daily.   Marland Kitchen glucosamine-chondroitin 500-400 MG tablet Take 1 tablet by mouth 2 (two) times daily. Reported on 09/11/2015  . umeclidinium bromide (INCRUSE ELLIPTA) 62.5 MCG/INH AEPB Inhale 1 puff into the lungs daily.  . [DISCONTINUED] Tiotropium Bromide Monohydrate (SPIRIVA RESPIMAT) 2.5 MCG/ACT AERS Inhale 2 puffs into the lungs daily.   No facility-administered encounter medications on file as of 11/11/2016.     Allergies as of 11/11/2016 - Review Complete 11/11/2016  Allergen Reaction Noted  . Gentamicin Other (See Comments) 09/19/2015  .  Loteprednol-tobramycin  09/19/2015    Past Medical History:  Diagnosis Date  . Arthritis   . Cancer ([place])    basal cell on right temple  . Hemorrhoids   . Lateral epicondylitis (tennis elbow)   . Non-small cell lung cancer (NSCLC) ([place]) dx'f 07/30/15  . Odynophagia 11/06/2015  . Spondylosis of cervical joint     Past Surgical History:  Procedure Laterality Date  . COLONOSCOPY  2003  . KNEE ARTHROSCOPY  2006   right    Family History  Problem Relation Age of Onset  . Hypertension Mother   . Heart disease Mother   . Coronary artery disease Unknown     Social History   Social History  . Marital status: Married    Spouse name: N/A  . Number of children: N/A  . Years of education: N/A   Occupational History  . Retired      Games developer   Social History Main Topics  . Smoking status: Former Smoker    Packs/day: 0.20    Years: 62.00    Types: Cigarettes    Quit date: 03/10/2011  . Smokeless tobacco: Never Used     Comment: smokes 1 ciagettes occasionally  . Alcohol use 4.2 oz/week    7 Glasses of wine per week     Comment: 1-2 glasses of wine a day  . Drug use: No  . Sexual activity: Yes   Other Topics Concern  . Not on file   Social History Narrative  . No narrative on file    Review of systems: Review of Systems  Constitutional: Negative for fever and chills.  HENT: Negative.   Eyes: Negative for blurred vision.  Respiratory: as per HPI  Cardiovascular: Negative for chest pain and palpitations.  Gastrointestinal: Negative for vomiting, diarrhea, blood per rectum. Genitourinary: Negative for dysuria, urgency, frequency and hematuria.  Musculoskeletal: Negative for myalgias, back pain and joint pain.  Skin: Negative for itching and rash.  Neurological: Negative for dizziness, tremors, focal weakness, seizures and loss of consciousness.  Endo/Heme/Allergies: Negative for environmental allergies.  Psychiatric/Behavioral: Negative for depression, suicidal  ideas and hallucinations.  All other systems reviewed and are negative.  Physical Exam: Blood pressure 122/74, pulse 89, height _0  (1.727 m), weight 172 lb 9.6 oz (78.3 kg), SpO2 95 %. Gen:      No acute distress HEENT:  EOMI, sclera anicteric Neck:     No masses; no thyromegaly Lungs:    Reduced breath sounds at left base; normal respiratory effort CV:         Regular rate and rhythm; no murmurs Abd:      + bowel sounds; soft, non-tender; no palpable masses, no distension Ext:    No edema; adequate peripheral perfusion Skin:      Warm and dry; no rash Neuro: alert and oriented x 3 Psych: normal mood and affect  Data Reviewed: CT scan 08/05/16- emphysematous changes, bilateral pulmonary scarring. Persistent left-sided pleural effusion with atelectasis. Chest x-ray 10/16/16-bilateral patchy opacities, left pleural effusion which is unchanged from prior. I have reviewed all images personally.  Assessment:  Left squamous cell lung cancer status post chemotherapy, XRT Exudative left pleural effusion Radiation pneumonitis  Evaluated in the office today for back pain. We get a chest x-ray PA lateral to reevaluate the effusion and lung scarring. If the x-ray is abnormal then we can consider CT for further evaluation. If the effusion has reaccumulated he'll need a repeat thoracentesis He does not have any worsening dyspnea, hypoxia or pleuritic type symptoms to suggest pulmonary embolism.  I suspect that the symptoms may be musculoskeletal. He'll use warm compresses, over-the-counter anti-inflammatories such as Tylenol and Motrin.  Plan/Recommendations: - CXR today   Marshell Garfinkel MD [facility] Pager [phone] 11/11/2016, 9:32 AM  CC: [name], MD

## 2016-12-01 ENCOUNTER — Telehealth: Payer: Self-pay | Admitting: *Deleted

## 2016-12-01 NOTE — Telephone Encounter (Signed)
Wife reports pain in neck down to mid back ~ 1 month. "Thought it was going to go away, but has gotten worse. It is in the same place I had radiation last year". Report intermittent cough in AM. Is requesting to see MD.  [name] states that all scans have been clear, might want to see PCP or orthopedic MD to check for disc disease. Verbalized understanding

## 2016-12-16 ENCOUNTER — Ambulatory Visit (INDEPENDENT_AMBULATORY_CARE_PROVIDER_SITE_OTHER): Payer: Medicare Other | Admitting: Internal Medicine

## 2016-12-16 ENCOUNTER — Encounter: Payer: Self-pay | Admitting: Internal Medicine

## 2016-12-16 VITALS — BP 112/72 | HR 77 | Ht 68.0 in | Wt 171.8 lb

## 2016-12-16 DIAGNOSIS — M546 Pain in thoracic spine: Secondary | ICD-10-CM

## 2016-12-16 DIAGNOSIS — G8929 Other chronic pain: Secondary | ICD-10-CM

## 2016-12-16 DIAGNOSIS — J701 Chronic and other pulmonary manifestations due to radiation: Secondary | ICD-10-CM

## 2016-12-16 DIAGNOSIS — C3492 Malignant neoplasm of unspecified part of left bronchus or lung: Secondary | ICD-10-CM | POA: Diagnosis not present

## 2016-12-16 DIAGNOSIS — J90 Pleural effusion, not elsewhere classified: Secondary | ICD-10-CM

## 2016-12-16 DIAGNOSIS — R079 Chest pain, unspecified: Secondary | ICD-10-CM | POA: Insufficient documentation

## 2016-12-16 HISTORY — DX: Other chronic pain: G89.29

## 2016-12-16 HISTORY — DX: Chest pain, unspecified: R07.9

## 2016-12-16 MED ORDER — SENNOSIDES-DOCUSATE SODIUM 8.6-50 MG PO TABS: 1.0000 | ORAL_TABLET | Freq: Every day | ORAL | 0 refills | Status: DC

## 2016-12-16 MED ORDER — OXYCODONE-ACETAMINOPHEN 5-325 MG PO TABS: 1.0000 | ORAL_TABLET | Freq: Three times a day (TID) | ORAL | 0 refills | Status: DC | PRN

## 2016-12-16 NOTE — Progress Notes (Signed)
Subjective:     Patient ID: Jason Summers, male   DOB: Oct 17, 1931, 83 y.o.   MRN: [id number]  HPI  IOV 12/16/2016  Chief Complaint  Patient presents with  . Acute Visit    Pt c/o chest tightness, mid back pain, and right lateral rib pain when pt takes a deep breath in. Pt also c/o mild dry cough with some chest congestion. Pt denies f/c/s.     83 year old male with stage III non-small cell lung cancer and resultant radiation related pleural effusion. This supposed to go to [place] country in June 2018 but he canceled because of his declining health. He tells me that his functional status is pretty good. His appetite is pretty good. However he is having worsening lower thoracic back pain. He saw my colleague approximately one month ago. Chest x-ray was unchanged. He was advised nonsteroidal anti-inflammatory drugs and Tylenol for pain relief. But now the pain has broken through. The pain is worse. It is present in the midthoracic right infra-axillary and lower anterior chest area. It gets worse with inspiration. It is severe. He feels he might need something stronger for pain relief. There is no neurologic deficit. There is no constipation. His effort tolerance is okay with there is slight increased fatigue.    has a past medical history of Arthritis; Cancer ([place]); Hemorrhoids; Lateral epicondylitis (tennis elbow); Non-small cell lung cancer (NSCLC) ([place]) (dx'f 07/30/15); Odynophagia (11/06/2015); and Spondylosis of cervical joint.   reports that he quit smoking about 5 years ago. His smoking use included Cigarettes. He has a 12.40 pack-year smoking history. He has never used smokeless tobacco.  Past Surgical History:  Procedure Laterality Date  . COLONOSCOPY  2003  . KNEE ARTHROSCOPY  2006   right    Allergies  Allergen Reactions  . Gentamicin Other (See Comments)    Made eyes red, swollen, hot feeling  . Loteprednol-Tobramycin     Other reaction(s): Other (See Comments) Made  eyes Red, Swollen, Red, Warm feeling     Immunization History  Administered Date(s) Administered  . Influenza Split 03/19/2011, 03/12/2012  . Influenza Whole 06/09/1997, 04/22/2007, 03/14/2008, 03/28/2009, 05/09/2010  . Influenza, High Dose Seasonal PF 04/14/2013, 04/11/2015  . Influenza,inj,Quad PF,36+ Mos 05/07/2016  . Influenza-Unspecified 04/10/2014  . Pneumococcal Conjugate-13 10/14/2013  . Pneumococcal Polysaccharide-23 06/09/2004, 10/04/2012  . Td 06/09/2005  . Zoster 02/04/2015    Family History  Problem Relation Age of Onset  . Hypertension Mother   . Heart disease Mother   . Coronary artery disease Unknown      Current Outpatient Prescriptions:  .  aspirin 81 MG EC tablet, Take 81 mg by mouth every other day. Reported on 10/16/2015, Disp: , Rfl:  .  esomeprazole (NEXIUM) 40 MG capsule, Take 1 capsule (40 mg total) by mouth daily at 12 noon., Disp: 90 capsule, Rfl: 3 .  fish oil-omega-3 fatty acids 1000 MG capsule, Take 2 g by mouth daily. , Disp: , Rfl:  .  glucosamine-chondroitin 500-400 MG tablet, Take 1 tablet by mouth daily. Reported on 09/11/2015, Disp: , Rfl:  .  umeclidinium bromide (INCRUSE ELLIPTA) 62.5 MCG/INH AEPB, Inhale 1 puff into the lungs daily., Disp: 30 each, Rfl: 11    Review of Systems     Objective:   Physical Exam  Constitutional: He is oriented to person, place, and time. He appears well-developed and well-nourished. No distress.  HENT:  Head: Normocephalic and atraumatic.  Right Ear: External ear normal.  Left Ear: External ear  normal.  Mouth/Throat: Oropharynx is clear and moist. No oropharyngeal exudate.  Eyes: Conjunctivae and EOM are normal. Pupils are equal, round, and reactive to light. Right eye exhibits no discharge. Left eye exhibits no discharge. No scleral icterus.  Neck: Normal range of motion. Neck supple. No JVD present. No tracheal deviation present. No thyromegaly present.  Cardiovascular: Normal rate, regular rhythm and  intact distal pulses.  Exam reveals no gallop and no friction rub.   No murmur heard. Pulmonary/Chest: Effort normal and breath sounds normal. No respiratory distress. He has no wheezes. He has no rales. He exhibits no tenderness.  Scattered crackles  Abdominal: Soft. Bowel sounds are normal. He exhibits no distension and no mass. There is no tenderness. There is no rebound and no guarding.  Musculoskeletal: Normal range of motion. He exhibits no edema or tenderness.  Lymphadenopathy:    He has no cervical adenopathy.  Neurological: He is alert and oriented to person, place, and time. He has normal reflexes. No cranial nerve deficit. Coordination normal.  Skin: Skin is warm and dry. No rash noted. He is not diaphoretic. No erythema. No pallor.  Psychiatric: He has a normal mood and affect. His behavior is normal. Judgment and thought content normal.  Nursing note and vitals reviewed.   Vitals:   12/16/16 0858  BP: 112/72  Pulse: 77  SpO2: 98%  Weight: 171 lb 12.8 oz (77.9 kg)  Height: 5' 8" (1.727 m)    Estimated body mass index is 26.12 kg/m as calculated from the following:   Height as of this encounter: 5' 8" (1.727 m).   Weight as of this encounter: 171 lb 12.8 oz (77.9 kg).      Assessment:       ICD-10-CM   1. Non-small cell cancer of left lung (HCC) C34.92   2. Pleural effusion on left J90   3. 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 Chest pain in adult R07.9   5. Chronic right-sided thoracic back pain M54.6    G89.29        Plan:     . Start percocet 1 tablet 3 times a day as needed  - take 1-2 tablets senna docusate with this  Do restaging PET scan - next few to several days I will be out of town July 13-22  -  Followup  - Followup with DR Earlie Server a few days after PET Scane - [place] in 3 months   Dr. Brand Males, M.D., [facility].C.P Pulmonary and Critical Care Medicine Staff Physician [facility] Pager: [phone], If no answer or between  15:00h - 7:00h: call [phone]  12/16/2016 9:23 AM

## 2016-12-16 NOTE — Patient Instructions (Addendum)
ICD-10-CM   1. Chronic right-sided thoracic back pain M54.6    G89.29   2. Non-small cell cancer of left lung (HCC) C34.92   3. Pleural effusion on left J90   4. 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 Chest pain in adult R07.9    Start percocet 1 tablet 3 times a day as needed  - take 1-2 tablets senna docusate with this  Do restaging PET scan - next few to several days I will be out of town July 13-22  -  Followup  - Followup with DR Earlie Server a few days after PET Scane - [place] months

## 2016-12-16 NOTE — Addendum Note (Signed)
Addended by: [name] Salina on: 12/16/2016 09:29 AM   Modules accepted: Orders

## 2016-12-30 ENCOUNTER — Ambulatory Visit (HOSPITAL_COMMUNITY)
Admission: RE | Admit: 2016-12-30 | Discharge: 2016-12-30 | Disposition: A | Discharge status: Home / Self Care | Payer: Medicare Other | Source: Ambulatory Visit | Attending: Internal Medicine | Admitting: Internal Medicine

## 2016-12-30 ENCOUNTER — Telehealth: Payer: Self-pay | Admitting: Internal Medicine

## 2016-12-30 DIAGNOSIS — J90 Pleural effusion, not elsewhere classified: Secondary | ICD-10-CM | POA: Diagnosis not present

## 2016-12-30 DIAGNOSIS — J9811 Atelectasis: Secondary | ICD-10-CM | POA: Insufficient documentation

## 2016-12-30 DIAGNOSIS — C3492 Malignant neoplasm of unspecified part of left bronchus or lung: Secondary | ICD-10-CM | POA: Insufficient documentation

## 2016-12-30 DIAGNOSIS — I714 Abdominal aortic aneurysm, without rupture: Secondary | ICD-10-CM | POA: Insufficient documentation

## 2016-12-30 LAB — GLUCOSE, CAPILLARY: GLUCOSE-CAPILLARY: 97 mg/dL (ref 65–99)

## 2016-12-30 MED ORDER — FLUDEOXYGLUCOSE F - 18 (FDG) INJECTION
8.2900 | Freq: Once | INTRAVENOUS | Status: AC | PRN
  Administered 2016-12-30: 8.29 via INTRAVENOUS

## 2016-12-30 NOTE — Telephone Encounter (Signed)
Let Jason Summers know that PET scan is NOT showing evidence of active cancer. But he does have that pleural effusion.  PLAN  If pain is an issue still we can try draining his effusion again.   Keep appt with [name] in aguust 2018 but ? He needs CT chest next month - probably not - he can call Raynelle Chary office and cancel  Dr. Brand Males, M.D., [facility].C.P Pulmonary and Critical Care Medicine Staff Physician [facility] Pager: [phone], If no answer or between  15:00h - 7:00h: call [phone]  12/30/2016 2:06 PM     Nm Pet Image Restag (ps) Skull Base To Thigh  Result Date: 12/30/2016 CLINICAL DATA:  Subsequent treatment strategy for non-small cell left lung cancer. EXAM: NUCLEAR MEDICINE PET SKULL BASE TO THIGH TECHNIQUE: 8.3 mCi F-18 FDG was injected intravenously. Full-ring PET imaging was performed from the skull base to thigh after the radiotracer. CT data was obtained and used for attenuation correction and anatomic localization. FASTING BLOOD GLUCOSE:  Value: 97 mg/dl COMPARISON:  Prior PET-CT dated 09/05/2015 FINDINGS: NECK No hypermetabolic lymph nodes in the neck. Polypoid structure medially in the left maxillary sinus. CHEST No significant hypermetabolic activity in the vicinity of the previous left lower lobe mass. There is a moderate left pleural effusion with adjacent passive atelectasis and presumably the masses in the vicinity of the atelectatic lung, but the prior notable hypermetabolic activity is no longer present. The previously enlarged AP window lymph node is no longer hypermetabolic and measures about 8 mm in short axis (previously 21 mm). The previous hypermetabolic lingular nodule is no longer readily apparent although there is some bandlike volume loss medially in the right upper lobe. The prick the hypermetabolic activity associated with the prior lingular nodule has resolved. A right lower paratracheal lymph node  with fatty hilum measures 1.3 cm in short axis (previously the same) with maximum SUV 3.9 (formerly 4.2). Moderate left pleural effusion with passive atelectasis. Bandlike densities in the left upper lobe and left lower lobe probably from interval radiation therapy. Dependent atelectasis in the right lower lobe with some indistinct right paramediastinal opacity which is increased from prior. None of this appears particularly hypermetabolic. Coronary, aortic arch, and branch vessel atherosclerotic vascular disease. Mild cardiomegaly. ABDOMEN/PELVIS No abnormal hypermetabolic activity within the liver, pancreas, adrenal glands, or spleen. No hypermetabolic lymph nodes in the abdomen or pelvis. Photopenic hepatic cysts. Aortoiliac atherosclerotic vascular disease. Nonobstructive right nephrolithiasis observed with a 7 mm right mid kidney nonobstructive renal calculus. Infrarenal abdominal aortic aneurysm 3.1 by 2.9 cm on image 140/4, by my measurements this was previously 3.0 by 2.9 cm. Sigmoid colon diverticulosis.  Urinary bladder diverticulum. SKELETON No focal hypermetabolic activity to suggest skeletal metastasis. IMPRESSION: 1. Marked improvement of the foci of thoracic tumor. The AP window lymph node is markedly reduced in size and no longer hypermetabolic; the prior lingular nodule is no longer well seen; and the prior hypermetabolic left lower lobe mass is no longer well seen although there is some passive atelectasis in this vicinity due to a moderate left pleural effusion. 2. Borderline accentuated activity in a right lower paratracheal lymph node which is stable in size compared to prior exam, and minimally reduced in activity. 3. Infrarenal abdominal aortic aneurysm 3.1 cm in diameter. Recommend followup by ultrasound in 3 years. This recommendation follows ACR consensus guidelines: White Paper of the ACR Incidental Findings Committee II on Vascular  Findings. J Am Coll Radiol 2013; 02:111-735 4. Other  imaging findings of potential clinical significance: Aortic Atherosclerosis (ICD10-I70.0). Coronary atherosclerosis. Mild cardiomegaly. Photopenic hepatic cysts. Nonobstructive right nephrolithiasis. Sigmoid colon diverticulosis. Urinary bladder diverticulum. Left maxillary sinus polyp. Electronically Signed   By: Van Clines M.D.   On: 12/30/2016 12:03

## 2017-01-01 ENCOUNTER — Telehealth: Payer: Self-pay | Admitting: Internal Medicine

## 2017-01-01 ENCOUNTER — Telehealth: Payer: Self-pay

## 2017-01-01 NOTE — Telephone Encounter (Addendum)
Wife calling for PET results. I told wife to call Ramaswamy.

## 2017-01-01 NOTE — Telephone Encounter (Signed)
Spoke with pt's wife, Mickel Baas. She is requesting pt's PET scan results from 12/30/2016.  MR - please advise. Thanks.

## 2017-01-02 NOTE — Telephone Encounter (Signed)
See note from 01/01/17

## 2017-01-02 NOTE — Telephone Encounter (Signed)
See phone note sent to [facility] on 12/30/16 about resuls  Dr. Brand Males, M.D., [facility].C.P Pulmonary and Critical Care Medicine Staff Physician [facility] Pager: [phone], If no answer or between  15:00h - 7:00h: call [phone]  01/02/2017 8:20 AM

## 2017-01-02 NOTE — Telephone Encounter (Signed)
Spoke with patient's wife about results. She verbalized understanding. Stated that she will call Dr. Lew Dawes office about the need for the CT scan scheduled for next month.

## 2017-01-06 ENCOUNTER — Telehealth: Payer: Self-pay | Admitting: Internal Medicine

## 2017-01-06 NOTE — Telephone Encounter (Signed)
Spoke with pt spouse, Mickel Baas who had further questions in regards to PET scan results. Mickel Baas wanted to know if PET was of pt's whole body. I have made Mickel Baas aware that PET was from skull to thigh. I also relayed PET results to Mickel Baas from MR from 12/30/16 phone note again due to additional questions. Mickel Baas voiced her understanding and had no further questions. Nothing further needed.

## 2017-01-06 NOTE — Telephone Encounter (Signed)
lmtcb for pt.

## 2017-01-06 NOTE — Telephone Encounter (Signed)
Pt wife returning call and can be reached @ [phone].Hillery Hunter

## 2017-01-12 ENCOUNTER — Telehealth: Payer: Self-pay | Admitting: *Deleted

## 2017-01-12 NOTE — Telephone Encounter (Signed)
Returned call to pt regarding up coming CT scan. Per MD, pt will not need CT scan since he had recent PET Scan. Pt verbalized understanding, called Central scheduling and cancelled.

## 2017-01-27 ENCOUNTER — Other Ambulatory Visit (HOSPITAL_BASED_OUTPATIENT_CLINIC_OR_DEPARTMENT_OTHER): Payer: Medicare Other

## 2017-01-27 ENCOUNTER — Ambulatory Visit (HOSPITAL_COMMUNITY): Payer: Medicare Other

## 2017-01-27 DIAGNOSIS — C3432 Malignant neoplasm of lower lobe, left bronchus or lung: Secondary | ICD-10-CM

## 2017-01-27 DIAGNOSIS — C3492 Malignant neoplasm of unspecified part of left bronchus or lung: Secondary | ICD-10-CM

## 2017-01-27 DIAGNOSIS — G4452 New daily persistent headache (NDPH): Secondary | ICD-10-CM

## 2017-01-27 LAB — CBC WITH DIFFERENTIAL/PLATELET
BASO%: 0.8 % (ref 0.0–2.0)
BASOS ABS: 0 10*3/uL (ref 0.0–0.1)
EOS ABS: 0.1 10*3/uL (ref 0.0–0.5)
EOS%: 2.6 % (ref 0.0–7.0)
HCT: 43 % (ref 38.4–49.9)
HGB: 14.3 g/dL (ref 13.0–17.1)
LYMPH%: 21.5 % (ref 14.0–49.0)
MCH: 28.8 pg (ref 27.2–33.4)
MCHC: 33.3 g/dL (ref 32.0–36.0)
MCV: 86.4 fL (ref 79.3–98.0)
MONO#: 0.6 10*3/uL (ref 0.1–0.9)
MONO%: 11.3 % (ref 0.0–14.0)
NEUT#: 3.5 10*3/uL (ref 1.5–6.5)
NEUT%: 63.8 % (ref 39.0–75.0)
Platelets: 183 10*3/uL (ref 140–400)
RBC: 4.98 10*6/uL (ref 4.20–5.82)
RDW: 14.5 % (ref 11.0–14.6)
WBC: 5.5 10*3/uL (ref 4.0–10.3)
lymph#: 1.2 10*3/uL (ref 0.9–3.3)

## 2017-01-27 LAB — COMPREHENSIVE METABOLIC PANEL
ALBUMIN: 3.5 g/dL (ref 3.5–5.0)
ALK PHOS: 89 U/L (ref 40–150)
ALT: 13 U/L (ref 0–55)
AST: 16 U/L (ref 5–34)
Anion Gap: 6 mEq/L (ref 3–11)
BUN: 15.5 mg/dL (ref 7.0–26.0)
CO2: 25 meq/L (ref 22–29)
Calcium: 9.1 mg/dL (ref 8.4–10.4)
Chloride: 107 mEq/L (ref 98–109)
Creatinine: 0.9 mg/dL (ref 0.7–1.3)
EGFR: 76 mL/min/{1.73_m2} — AB (ref >90)
GLUCOSE: 102 mg/dL (ref 70–140)
Potassium: 4.4 mEq/L (ref 3.5–5.1)
Sodium: 139 mEq/L (ref 136–145)
Total Bilirubin: 0.52 mg/dL (ref 0.20–1.20)
Total Protein: 7.1 g/dL (ref 6.4–8.3)

## 2017-02-03 ENCOUNTER — Telehealth: Payer: Self-pay | Admitting: Internal Medicine

## 2017-02-03 ENCOUNTER — Encounter: Payer: Self-pay | Admitting: Internal Medicine

## 2017-02-03 ENCOUNTER — Ambulatory Visit (HOSPITAL_BASED_OUTPATIENT_CLINIC_OR_DEPARTMENT_OTHER): Payer: Medicare Other | Admitting: Internal Medicine

## 2017-02-03 VITALS — BP 130/72 | HR 87 | Temp 97.7°F | Resp 18 | Ht 68.0 in | Wt 170.5 lb

## 2017-02-03 DIAGNOSIS — C3492 Malignant neoplasm of unspecified part of left bronchus or lung: Secondary | ICD-10-CM

## 2017-02-03 DIAGNOSIS — R519 Headache, unspecified: Secondary | ICD-10-CM

## 2017-02-03 DIAGNOSIS — C3432 Malignant neoplasm of lower lobe, left bronchus or lung: Secondary | ICD-10-CM | POA: Diagnosis not present

## 2017-02-03 DIAGNOSIS — R51 Headache: Secondary | ICD-10-CM

## 2017-02-03 HISTORY — DX: Headache, unspecified: R51.9

## 2017-02-03 NOTE — Progress Notes (Signed)
[place] [phone]   [phone]  OFFICE PROGRESS NOTE  [name], MD [place] [address]  DIAGNOSIS: Stage IIIA (T3, N2, M0) non-small cell lung cancer, poorly differentiated squamous cell carcinoma diagnosed in February 2017 with PDL 1 expression of 35%, presented with large left lower lobe lung mass in addition to left upper lobe lung nodule and mediastinal lymphadenopathy.  PRIOR THERAPY: Course of concurrent chemoradiation with weekly carboplatin for AUC of 2 and paclitaxel 45 MG/M2. Status post 7 cycles, last dose was given 11/19/2015 with partial response.  CURRENT THERAPY: Observation.  INTERVAL HISTORY: Jason Summers 83 y.o. male returns to the clinic today for follow-up visit accompanied by his wife. The patient is feeling fine today was no specific complaints except for the delay morning headache mainly in the temporal area. It gets better with ibuprofen. He denied having any visual changes. He has no chest pain, shortness of breath, cough or hemoptysis. He denied having any fever or chills. He has no nausea, vomiting, diarrhea or constipation. He had repeat PET scan performed recently and he is here for evaluation and discussion of his condition and treatment options.  MEDICAL HISTORY: Past Medical History:  Diagnosis Date  . Arthritis   . Cancer ([place])    basal cell on right temple  . Hemorrhoids   . Lateral epicondylitis (tennis elbow)   . Non-small cell lung cancer (NSCLC) ([place]) dx'f 07/30/15  . Odynophagia 11/06/2015  . Spondylosis of cervical joint     ALLERGIES:  is allergic to gentamicin and loteprednol-tobramycin.  MEDICATIONS:  Current Outpatient Prescriptions  Medication Sig Dispense Refill  . aspirin 81 MG EC tablet Take 81 mg by mouth every other day. Reported on 10/16/2015    . esomeprazole (NEXIUM) 40 MG capsule Take 1 capsule (40 mg total) by mouth daily at 12 noon. 90 capsule 3    . fish oil-omega-3 fatty acids 1000 MG capsule Take 2 g by mouth daily.     Marland Kitchen glucosamine-chondroitin 500-400 MG tablet Take 1 tablet by mouth daily. Reported on 09/11/2015     No current facility-administered medications for this visit.     SURGICAL HISTORY:  Past Surgical History:  Procedure Laterality Date  . COLONOSCOPY  2003  . KNEE ARTHROSCOPY  2006   right    REVIEW OF SYSTEMS:  A comprehensive review of systems was negative except for: Neurological: positive for headaches   PHYSICAL EXAMINATION: General appearance: alert, cooperative and no distress Head: Normocephalic, without obvious abnormality, atraumatic Neck: no adenopathy, no JVD, supple, symmetrical, trachea midline and thyroid not enlarged, symmetric, no tenderness/mass/nodules Lymph nodes: Cervical, supraclavicular, and axillary nodes normal. Resp: wheezes bilaterally Back: symmetric, no curvature. ROM normal. No CVA tenderness. Cardio: regular rate and rhythm, S1, S2 normal, no murmur, click, rub or gallop GI: soft, non-tender; bowel sounds normal; no masses,  no organomegaly Extremities: extremities normal, atraumatic, no cyanosis or edema  ECOG PERFORMANCE STATUS: 1 - Symptomatic but completely ambulatory  Blood pressure 130/72, pulse 87, temperature 97.7 F (36.5 C), temperature source Oral, resp. rate 18, height 5' 8" (1.727 m), weight 170 lb 8 oz (77.3 kg), SpO2 96 %.  LABORATORY DATA: Lab Results  Component Value Date   WBC 5.5 01/27/2017   HGB 14.3 01/27/2017   HCT 43.0 01/27/2017   MCV 86.4 01/27/2017   PLT 183 01/27/2017      Chemistry      Component Value Date/Time  NA 139 01/27/2017 0826   K 4.4 01/27/2017 0826   CL 103 08/22/2015 1443   CO2 25 01/27/2017 0826   BUN 15.5 01/27/2017 0826   CREATININE 0.9 01/27/2017 0826      Component Value Date/Time   CALCIUM 9.1 01/27/2017 0826   ALKPHOS 89 01/27/2017 0826   AST 16 01/27/2017 0826   ALT 13 01/27/2017 0826   BILITOT 0.52  01/27/2017 0826       RADIOGRAPHIC STUDIES: Nm Pet Image Restag (ps) Skull Base To Thigh  Result Date: 12/30/2016 CLINICAL DATA:  Subsequent treatment strategy for non-small cell left lung cancer. EXAM: NUCLEAR MEDICINE PET SKULL BASE TO THIGH TECHNIQUE: 8.3 mCi F-18 FDG was injected intravenously. Full-ring PET imaging was performed from the skull base to thigh after the radiotracer. CT data was obtained and used for attenuation correction and anatomic localization. FASTING BLOOD GLUCOSE:  Value: 97 mg/dl COMPARISON:  Prior PET-CT dated 09/05/2015 FINDINGS: NECK No hypermetabolic lymph nodes in the neck. Polypoid structure medially in the left maxillary sinus. CHEST No significant hypermetabolic activity in the vicinity of the previous left lower lobe mass. There is a moderate left pleural effusion with adjacent passive atelectasis and presumably the masses in the vicinity of the atelectatic lung, but the prior notable hypermetabolic activity is no longer present. The previously enlarged AP window lymph node is no longer hypermetabolic and measures about 8 mm in short axis (previously 21 mm). The previous hypermetabolic lingular nodule is no longer readily apparent although there is some bandlike volume loss medially in the right upper lobe. The prick the hypermetabolic activity associated with the prior lingular nodule has resolved. A right lower paratracheal lymph node with fatty hilum measures 1.3 cm in short axis (previously the same) with maximum SUV 3.9 (formerly 4.2). Moderate left pleural effusion with passive atelectasis. Bandlike densities in the left upper lobe and left lower lobe probably from interval radiation therapy. Dependent atelectasis in the right lower lobe with some indistinct right paramediastinal opacity which is increased from prior. None of this appears particularly hypermetabolic. Coronary, aortic arch, and branch vessel atherosclerotic vascular disease. Mild cardiomegaly.  ABDOMEN/PELVIS No abnormal hypermetabolic activity within the liver, pancreas, adrenal glands, or spleen. No hypermetabolic lymph nodes in the abdomen or pelvis. Photopenic hepatic cysts. Aortoiliac atherosclerotic vascular disease. Nonobstructive right nephrolithiasis observed with a 7 mm right mid kidney nonobstructive renal calculus. Infrarenal abdominal aortic aneurysm 3.1 by 2.9 cm on image 140/4, by my measurements this was previously 3.0 by 2.9 cm. Sigmoid colon diverticulosis.  Urinary bladder diverticulum. SKELETON No focal hypermetabolic activity to suggest skeletal metastasis. IMPRESSION: 1. Marked improvement of the foci of thoracic tumor. The AP window lymph node is markedly reduced in size and no longer hypermetabolic; the prior lingular nodule is no longer well seen; and the prior hypermetabolic left lower lobe mass is no longer well seen although there is some passive atelectasis in this vicinity due to a moderate left pleural effusion. 2. Borderline accentuated activity in a right lower paratracheal lymph node which is stable in size compared to prior exam, and minimally reduced in activity. 3. Infrarenal abdominal aortic aneurysm 3.1 cm in diameter. Recommend followup by ultrasound in 3 years. This recommendation follows ACR consensus guidelines: White Paper of the ACR Incidental Findings Committee II on Vascular Findings. J Am Coll Radiol 2013; 14:970-263 4. Other imaging findings of potential clinical significance: Aortic Atherosclerosis (ICD10-I70.0). Coronary atherosclerosis. Mild cardiomegaly. Photopenic hepatic cysts. Nonobstructive right nephrolithiasis. Sigmoid colon diverticulosis. Urinary bladder diverticulum.  Left maxillary sinus polyp. Electronically Signed   By: Van Clines M.D.   On: 12/30/2016 12:03   ASSESSMENT AND PLAN:  This is a very pleasant 83 years old white male with stage IIIA non-small cell lung cancer, squamous cell carcinoma. The patient completed a course of  concurrent chemoradiation with weekly carboplatin and paclitaxel for 7 cycles and tolerated his treatment well except for radiation induced pneumonitis as well as odynophagia and dysphagia secondary to radiation induced esophagitis.  The patient is currently on observation. He is feeling fine. The recent PET scan showed no evidence for disease recurrence or progression. I discussed the scan results with the patient and his wife who recommended for him to continue on observation and repeat CT scan of the chest in 6 months. For headache, I will refer the patient to neurology for evaluation. He was advised to call immediately if he has any concerning symptoms in the interval. The patient voices understanding of current disease status and treatment options and is in agreement with the current care plan.  All questions were answered. The patient knows to call the clinic with any problems, questions or concerns. We can certainly see the patient much sooner if necessary.  Disclaimer: This note was dictated with voice recognition software. Similar sounding words can inadvertently be transcribed and may not be corrected upon review.

## 2017-02-03 NOTE — Telephone Encounter (Signed)
Gave patient avs and calendar with dates.

## 2017-02-10 ENCOUNTER — Ambulatory Visit: Payer: Medicare Other | Admitting: Internal Medicine

## 2017-03-02 ENCOUNTER — Encounter: Payer: Self-pay | Admitting: Internal Medicine

## 2017-03-02 ENCOUNTER — Ambulatory Visit (INDEPENDENT_AMBULATORY_CARE_PROVIDER_SITE_OTHER): Payer: Medicare Other | Admitting: Internal Medicine

## 2017-03-02 VITALS — BP 122/80 | HR 69 | Temp 97.7°F | Ht 66.25 in | Wt 169.2 lb

## 2017-03-02 DIAGNOSIS — E785 Hyperlipidemia, unspecified: Secondary | ICD-10-CM

## 2017-03-02 DIAGNOSIS — Z0000 Encounter for general adult medical examination without abnormal findings: Secondary | ICD-10-CM

## 2017-03-02 DIAGNOSIS — N401 Enlarged prostate with lower urinary tract symptoms: Secondary | ICD-10-CM

## 2017-03-02 DIAGNOSIS — R351 Nocturia: Secondary | ICD-10-CM

## 2017-03-02 DIAGNOSIS — Z0001 Encounter for general adult medical examination with abnormal findings: Secondary | ICD-10-CM | POA: Diagnosis not present

## 2017-03-02 DIAGNOSIS — Z23 Encounter for immunization: Secondary | ICD-10-CM

## 2017-03-02 LAB — POCT URINALYSIS DIPSTICK
Bilirubin, UA: NEGATIVE
Glucose, UA: NEGATIVE
Ketones, UA: NEGATIVE
LEUKOCYTES UA: NEGATIVE
NITRITE UA: NEGATIVE
PH UA: 6 (ref 5.0–8.0)
Protein, UA: NEGATIVE
RBC UA: NEGATIVE
Spec Grav, UA: 1.025 (ref 1.010–1.025)
Urobilinogen, UA: 0.2 E.U./dL

## 2017-03-02 LAB — LIPID PANEL
CHOL/HDL RATIO: 4
Cholesterol: 177 mg/dL (ref 0–200)
HDL: 46.4 mg/dL (ref >39.00)
LDL Cholesterol: 112 mg/dL — ABNORMAL HIGH (ref 0–99)
NONHDL: 130.46
TRIGLYCERIDES: 90 mg/dL (ref 0.0–149.0)
VLDL: 18 mg/dL (ref 0.0–40.0)

## 2017-03-02 LAB — TSH: TSH: 1.74 u[IU]/mL (ref 0.35–4.50)

## 2017-03-02 MED ORDER — TAMSULOSIN HCL 0.4 MG PO CAPS: 0.4000 mg | ORAL_CAPSULE | Freq: Every day | ORAL | 3 refills | Status: DC

## 2017-03-02 NOTE — Progress Notes (Signed)
Subjective:    Patient ID: Jason Summers, male    DOB: 1932-02-03, 83 y.o.   MRN: [id number]  HPI  83 year old patient who is seen today for a preventive health examination and Medicare wellness visit. He is followed closely by oncology with history of stage IIIa non-small cell lung cancer.  In July he had a very favorable., follow-up PET scan. His main complaint today is urinary frequency with slow stream and bothersome nocturia He has had recent surgery for a basal cell skin cancer involving the helix of the left ear  Past Medical History:  Diagnosis Date  . Arthritis   . Cancer ([place])    basal cell on right temple  . Hemorrhoids   . Lateral epicondylitis (tennis elbow)   . Non-small cell lung cancer (NSCLC) ([place]) dx'f 07/30/15  . Odynophagia 11/06/2015  . Spondylosis of cervical joint      Social History   Social History  . Marital status: Married    Spouse name: N/A  . Number of children: N/A  . Years of education: N/A   Occupational History  . Retired      Games developer   Social History Main Topics  . Smoking status: Former Smoker    Packs/day: 0.20    Years: 62.00    Types: Cigarettes    Quit date: 03/10/2011  . Smokeless tobacco: Never Used     Comment: smokes 1 ciagettes occasionally  . Alcohol use 4.2 oz/week    7 Glasses of wine per week     Comment: 1-2 glasses of wine a day  . Drug use: No  . Sexual activity: Yes   Other Topics Concern  . Not on file   Social History Narrative  . No narrative on file    Past Surgical History:  Procedure Laterality Date  . COLONOSCOPY  2003  . KNEE ARTHROSCOPY  2006   right    Family History  Problem Relation Age of Onset  . Hypertension Mother   . Heart disease Mother   . Coronary artery disease Unknown     Allergies  Allergen Reactions  . Gentamicin Other (See Comments)    Made eyes red, swollen, hot feeling  . Loteprednol-Tobramycin     Other reaction(s): Other (See Comments) Made eyes Red, Swollen,  Red, Warm feeling     Current Outpatient Prescriptions on File Prior to Visit  Medication Sig Dispense Refill  . aspirin 81 MG EC tablet Take 81 mg by mouth every other day. Reported on 10/16/2015    . esomeprazole (NEXIUM) 40 MG capsule Take 1 capsule (40 mg total) by mouth daily at 12 noon. 90 capsule 3  . fish oil-omega-3 fatty acids 1000 MG capsule Take 2 g by mouth daily.     Marland Kitchen glucosamine-chondroitin 500-400 MG tablet Take 1 tablet by mouth daily. Reported on 09/11/2015     No current facility-administered medications on file prior to visit.     BP 122/80 (BP Location: Right Arm, Patient Position: Sitting, Cuff Size: Normal)   Pulse 69   Temp 97.7 F (36.5 C) (Oral)   Ht 5' 6.25" (1.683 m)   Wt 169 lb 3.2 oz (76.7 kg)   BMI 27.10 kg/m   Subsequent Medicare wellness visit  1. Risk factors, based on past  M,S,F history.  Cardio vascular risk factors include age and male sex.  Imaging studies have revealed coronary and generalized atherosclerosis  2.  Physical activities: walks every other day  3.  Depression/mood:no history  of major depression or mood disorder.  Patient does give a history of depressive symptoms with loss of interest in daily activities.  He states that he does cry easily  4.  Hearing:mild to moderate deficits with occasional tinnitus  5.  ADL's:independent  6.  Fall risk:low  7.  Home safety:no problems identified  8.  Height weight, and visual acuity; height and weight stable no change in visual acuity  9.  Counseling:continue heart healthy diet and active lifestyle  10. Lab orders based on risk factors:laboratory studies from oncology reviewed  11. Referral :follow-up oncology  12. Care plan: trial of Flomax for BPH  13. Cognitive assessment: alert in order with normal affect no cognitive dysfunction  14. Screening: Patient provided with a written and personalized 5-10 year screening schedule in the AVS.    15. Provider List Update: primary care  oncology and GI    Review of Systems  Constitutional: Negative for appetite change, chills, fatigue and fever.  HENT: Negative for congestion, dental problem, ear pain, hearing loss, sore throat, tinnitus, trouble swallowing and voice change.   Eyes: Negative for pain, discharge and visual disturbance.  Respiratory: Negative for cough, chest tightness, wheezing and stridor.   Cardiovascular: Negative for chest pain, palpitations and leg swelling.  Gastrointestinal: Negative for abdominal distention, abdominal pain, blood in stool, constipation, diarrhea, nausea and vomiting.  Genitourinary: Positive for decreased urine volume, difficulty urinating and frequency. Negative for discharge, flank pain, genital sores, hematuria and urgency.  Musculoskeletal: Negative for arthralgias, back pain, gait problem, joint swelling, myalgias and neck stiffness.  Skin: Negative for rash.  Neurological: Negative for dizziness, syncope, speech difficulty, weakness, numbness and headaches.  Hematological: Negative for adenopathy. Does not bruise/bleed easily.  Psychiatric/Behavioral: Negative for behavioral problems and dysphoric mood. The patient is not nervous/anxious.        Objective:   Physical Exam  Constitutional: He appears well-developed and well-nourished.  HENT:  Head: Normocephalic and atraumatic.  Right Ear: External ear normal.  Left Ear: External ear normal.  Nose: Nose normal.  Mouth/Throat: Oropharynx is clear and moist.  Eyes: Pupils are equal, round, and reactive to light. Conjunctivae and EOM are normal. No scleral icterus.  Neck: Normal range of motion. Neck supple. No JVD present. No thyromegaly present.  Cardiovascular: Regular rhythm, normal heart sounds and intact distal pulses.  Exam reveals no gallop and no friction rub.   No murmur heard. Pedal pulses full except for a decreased right dorsalis pedis pulse  Pulmonary/Chest: Effort normal. He exhibits no tenderness.    Decreased breath sounds left base with a few scattered wheezes  Abdominal: Soft. Bowel sounds are normal. He exhibits no distension and no mass. There is no tenderness.  Genitourinary: Penis normal. Rectal exam shows guaiac negative stool.  Genitourinary Comments: Prostate plus 3 symmetrically enlarged  Musculoskeletal: Normal range of motion. He exhibits no edema or tenderness.  Lymphadenopathy:    He has no cervical adenopathy.  Neurological: He is alert. He has normal reflexes. No cranial nerve deficit. Coordination normal.  Slight decreased vibratory sensation distally  Skin: Skin is warm and dry. No rash noted.  Status post recent resection with sutures in place, left helix of the ear  Psychiatric: He has a normal mood and affect. His behavior is normal.          Assessment & Plan:   Preventive health examination Stage IIIa non-small cell lung cancer.  Follow-up oncology Symptomatic BPH.  Trial of Flomax History of esophageal stenosis/stricture.  Continue PPI therapy.  GI follow-up as needed Adjustment disorder with mild depression.  [name]

## 2017-03-02 NOTE — Patient Instructions (Addendum)
It is important that you exercise regularly, at least 20 minutes 3 to 4 times per week.  If you develop chest pain or shortness of breath seek  medical attention.  Oncology follow-up as scheduled   Benign Prostatic Hyperplasia Benign prostatic hyperplasia is when the prostate gland is bigger than normal (enlarged). The prostate is a gland that produces the fluid that goes into semen. It is near the opening to the bladder and it surrounds the tube that drains urine out of the body (urethra). Benign prostatic hyperplasia is common among older men and it typically causes problems with urinating. The prostate grows slowly as you age. As the prostate grows, it can pinch the urethra. This causes the bladder to work too hard to pass urine, which leads to a thickened bladder wall. The bladder may eventually become weak and unable to empty completely. What are the causes? The exact cause of this condition is not known. It may be related to changes in hormones as the body ages. What increases the risk? You are more likely to develop this condition if:  You have a family history of the condition.  You are age 81 or older.  You have a history of erectile dysfunction.  You do not exercise.  You have certain medical conditions, including: ? Type 2 diabetes. ? Obesity. ? Heart and circulatory disease.  What are the signs or symptoms? Symptoms of this condition include:  Weak or interrupted urine stream.  Dribbling or leaking urine.  Feeling like the bladder has not emptied completely.  Difficulty starting urination.  Getting up frequently at night to urinate.  Urinating more often (8 or more times a day).  Accidental loss of urine (urinary incontinence).  Pain during urination or ejaculation.  Urine with an unusual smell or color.  The size of the prostate does not always determine the severity of the symptoms. For example, a man with a large prostate may experience minor symptoms,  or a man with a smaller prostate may experience a severe blockage. How is this diagnosed? This condition may be diagnosed based on:  Your medical history and symptoms.  A physical exam. This usually includes a digital rectal exam. During this exam, your health care provider places a gloved, lubricated finger into the rectum to feel the size of the prostate.  A blood test. This test checks for high levels of a protein that is produced by the prostate (prostate specific antigen, PSA).  Tests to examine how well the urethra and bladder are functioning (urodynamic tests).  Cystoscopy. For this test, a small, tube-shaped instrument (cystoscope) is used to look inside the urethra and bladder. The cystoscope is placed into the urinary tract through the opening at the tip of the penis.  Urine tests.  Ultrasound.  How is this treated? Treatment for this condition depends on how severe your symptoms are. Treatment may include:  Active surveillance or "watchful waiting." If your symptoms are mild, your health care provider may delay treatment and ask you to keep track of your symptoms. You will have regular checkups to examine the size of your prostate, discuss symptoms, and determine whether treatment is needed.  Medicines. These may be used to: ? Stop prostate growth. ? Shrink the prostate. ? Relieve symptoms.  Lifestyle changes, including: ? Pelvic floor muscle exercises. The pelvic floor muscles are a group of muscles that relax when you urinate. ? Bladder training. This involves exercises that train the bladder to hold more urine for longer periods. ?  Reducing the amount of liquid that you drink. This is especially important before sleeping and before long periods of time spent in public. ? Reducing the amount of caffeine and alcohol that you drink. ? Treating or preventing constipation.  Surgery to reduce the size of the prostate or widen the urethra. This is typically done if your  symptoms are severe or there are serious complications from the enlarged prostate.  Follow these instructions at home: Medicines  Take over-the-counter and prescription medicines as told by your health care provider.  Avoid certain medicines, such as decongestants, antihistamines, and some prescription medicines as told by your health care provider. Ask your health care provider which medicines you should avoid. General instructions  Monitor your symptoms for any changes. Tell your health care provider about any changes.  Give yourself time when you urinate.  Avoid certain beverages that can irritate the bladder, such as: ? Alcohol. ? Caffeinated drinks like coffee, tea, and cola.  Avoid drinking large amounts of liquid before bed or before going out in public.  Do pelvic floor muscle or bladder training exercises as told by your health care provider.  Keep all follow-up visits as told by your health care provider. This is important. Contact a health care provider if:  Your develop new or worse symptoms.  You have trouble getting or maintaining an erection.  You have a fever.  You have pain or burning during urination.  You have blood in your urine. Get help right away if:  You have severe pain when urinating.  You cannot urinate.  You have severe pain in your abdomen.  You are dizzy.  You faint.  You have severe back pain.  Your urine is dark red and difficult to see through.  You have large blood clots in your urine.  You have severe pain after an erection.  You have chest pain, dizziness, or nausea during sexual activity. Summary  The prostate is a gland that produces the fluid that goes into semen. It is near the opening to the bladder and it surrounds the tube that drains urine out of the body (urethra).  Benign prostatic hyperplasia is common among older men and it typically causes problems with urinating.  If your symptoms are mild, your health care  provider may delay treatment and ask you to keep track of your symptoms. You will have regular checkups to examine the size of your prostate, discuss symptoms, and determine whether treatment is needed.  If directed, you may need to avoid certain medicines, such as decongestants, antihistamines, and some prescription medicines.  Contact your health care provider if you develop new or worse symptoms.

## 2017-03-09 ENCOUNTER — Telehealth: Payer: Self-pay | Admitting: Internal Medicine

## 2017-03-09 NOTE — Telephone Encounter (Signed)
Pt wife is calling to let md know the generic flomax side effects is affecting her husband and he will not be taking medication due to  dizziness, sleepiness and hallucination. [facility]. Please advice

## 2017-03-10 NOTE — Telephone Encounter (Signed)
Please advise

## 2017-03-16 ENCOUNTER — Ambulatory Visit (INDEPENDENT_AMBULATORY_CARE_PROVIDER_SITE_OTHER): Payer: Medicare Other | Admitting: Neurology

## 2017-03-16 ENCOUNTER — Encounter: Payer: Self-pay | Admitting: Neurology

## 2017-03-16 VITALS — BP 119/67 | HR 85 | Ht 68.0 in | Wt 172.0 lb

## 2017-03-16 DIAGNOSIS — R0683 Snoring: Secondary | ICD-10-CM

## 2017-03-16 DIAGNOSIS — C3402 Malignant neoplasm of left main bronchus: Secondary | ICD-10-CM | POA: Diagnosis not present

## 2017-03-16 DIAGNOSIS — G4730 Sleep apnea, unspecified: Secondary | ICD-10-CM | POA: Diagnosis not present

## 2017-03-16 DIAGNOSIS — R51 Headache: Secondary | ICD-10-CM | POA: Diagnosis not present

## 2017-03-16 DIAGNOSIS — G4710 Hypersomnia, unspecified: Secondary | ICD-10-CM | POA: Diagnosis not present

## 2017-03-16 DIAGNOSIS — J700 Acute pulmonary manifestations due to radiation: Secondary | ICD-10-CM | POA: Diagnosis not present

## 2017-03-16 DIAGNOSIS — R519 Headache, unspecified: Secondary | ICD-10-CM

## 2017-03-16 MED ORDER — CYPROHEPTADINE HCL 4 MG PO TABS: 4.0000 mg | ORAL_TABLET | Freq: Every day | ORAL | 0 refills | Status: DC

## 2017-03-16 NOTE — Progress Notes (Signed)
[facility]   Provider:  Larey Seat, M D  Primary Care Physician:  [name], MD   Referring Provider:    Chief Complaint  Patient presents with  . New Patient (Initial Visit)    pt with wife,rm 10. pt states almost every morning the patient wakes with a headache that starts near temples and works upward. pt states sometimes difficulty hearing, he states its like he is in a tunnel. pt has been struggling about a year with these headaches.     HPI:  Jason Summers is a 83 y.o. male , seen here as in a referral/ revisit  from his Oncologist ,Dr. Inda Merlin, for morning headaches  Jason Summers is younger appearing male patient originally from [place], was seen here today for further evaluation of a headache that may be related to his sleep. The patient describes her headache as being present when he wakes up in the morning usually arising from the temporal area at the feeling of a pressure that alleviated only after he takes ibuprofen- but also only temporarily. Usually he has a feeling of a nagging ache, a background headache with a pressure sensation.  The patient had been diagnosed with a non-small cell lung cancer been febrile 2017 underwent partial lung removal, radiation and chemotherapy. He had a PET scan, with skull base images. 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  Jason Summers underwent an MRI of the brain on 09/16/2016 there was a stable punctate enhancement of the left occipital white matter, this has been stable for a year. Small enhancement of the superior left cerebellum stable. No acute infarction hemorrhage hydrocephalus no swelling no specific explanation for his headaches were found.  Sleep habits are as follows: the couples bedtime is usually around 9 PM, and by patient sometimes struggles with difficulties to go to sleep, sometimes with difficulties to stay asleep. The bedroom is cool, quiet and dark. He has more nocturia now going to the bathroom at least 4 times at night. His wife has witnessed him snoring, struggling for air, she has also witnessed apneas. Does a lot of crescendo breathing.He has been waking with his headaches, rarely does he go to bed with HA.  Dry mouth in the morning. He sleeps with an open mouth .    Sleep medical history and family sleep history:  No known apnea.   Social history: Married. Quit smoking in 2012, ETOH - one in a while wine. Caffeine; coffee in AM 3 cups, no soda and no iced tea.    Review of Systems: Out of a complete 14 system review, the patient complains of only the following symptoms, and all other reviewed systems are negative.  snoring , apnea, morning headaches.   Epworth score 12 , Fatigue severity score 34  , depression score 3/15    Social History   Social History  . Marital status: Married    Spouse name:  N/A  . Number of children: N/A  . Years of education: N/A   Occupational History  . Retired      Games developer   Social History Main Topics  . Smoking status: Former Smoker    Packs/day: 0.20    Years: 62.00    Types: Cigarettes    Quit date: 03/10/2011  . Smokeless tobacco: Never Used     Comment: smokes 1 ciagettes occasionally  . Alcohol use 4.2 oz/week    7 Glasses of wine per week     Comment: 1-2 glasses of wine a day  . Drug use: No  . Sexual activity: Yes    Other Topics Concern  . Not on file   Social History Narrative  . No narrative on file    Family History  Problem Relation Age of Onset  . Hypertension Mother   . Heart disease Mother   . Coronary artery disease Unknown     Past Medical History:  Diagnosis Date  . Arthritis   . Cancer ([facility])    basal cell on right temple  . Hemorrhoids   . Lateral epicondylitis (tennis elbow)   . Non-small cell lung cancer (NSCLC) ([place]) dx'f 07/30/15  . Odynophagia 11/06/2015  . Spondylosis of cervical joint     Past Surgical History:  Procedure Laterality Date  . COLONOSCOPY  2003  . KNEE ARTHROSCOPY  2006   right    Current Outpatient Prescriptions  Medication Sig Dispense Refill  . aspirin 81 MG EC tablet Take 81 mg by mouth every other day. Reported on 10/16/2015    . esomeprazole (NEXIUM) 40 MG capsule Take 1 capsule (40 mg total) by mouth daily at 12 noon. 90 capsule 3  . fish oil-omega-3 fatty acids 1000 MG capsule Take 2 g by mouth daily.     Marland Kitchen glucosamine-chondroitin 500-400 MG tablet Take 1 tablet by mouth daily. Reported on 09/11/2015     No current facility-administered medications for this visit.     Allergies as of 03/16/2017 - Review Complete 03/16/2017  Allergen Reaction Noted  . Gentamicin Other (See Comments) 09/19/2015  . Loteprednol-tobramycin  09/19/2015    Vitals: BP 119/67   Pulse 85   Ht _0  (1.727 m)   Wt 172 lb (78 kg)   BMI 26.15 kg/m  Last Weight:  Wt Readings from Last 1 Encounters:  03/16/17 172 lb (78 kg)   PIR:JJOA mass index is 26.15 kg/m.     Last Height:   Ht Readings from Last 1 Encounters:  03/16/17 _1  (1.727 m)    Physical exam:  General: The patient is awake, alert and appears not in acute distress. The patient is well groomed. Head: Normocephalic, atraumatic. Neck is supple. Mallampati 3 ,  neck circumference:14. 5. Nasal airflow patent, Retrognathia is seen.  Cardiovascular:  Regular rate and rhythm, without  murmurs or  carotid bruit, and without distended neck veins. Respiratory: Lungs are clear to auscultation. Skin:  Without evidence of edema, or rash Trunk: BMI is 26. The patient's posture is erect   Neurologic exam : The patient is awake and alert, oriented to place and time. Attention span & concentration ability appears normal.  Speech is fluent,  without dysarthria, dysphonia or aphasia.  Mood and affect are appropriate.  Cranial nerves: Pupils are equal and briskly reactive to light. Funduscopic exam without evidence of pallor or edema. Status post cataract . Left eye appears slightly sunken. Normal eye movements.  Extraocular movements  in vertical  and horizontal planes intact and without nystagmus. Visual fields by finger perimetry are intact. Hearing to finger rub intact.  Facial sensation intact to fine touch. Facial motor strength is symmetric and tongue and uvula move midline. Shoulder shrug was symmetrical. Motor exam:   Normal tone, muscle bulk and symmetric strength in all extremities. Sensory:  Fine touch, pinprick and vibration were tested in all extremities. Proprioception tested in the upper extremities was normal. Coordination: Rapid alternating movements in the fingers/hands was normal. Finger-to-nose maneuver  normal without evidence of ataxia, dysmetria or tremor. Gait and station: Patient walks without assistive device and is able unassisted to climb up to the exam table. Strength within normal limits.  Stance is stable and normal. Tandem gait is unfragmented. Turns with  3 Steps.  Deep tendon reflexes: in the  upper and lower extremities are symmetric and intact.  Assessment:  After physical and neurologic examination, review of laboratory studies,  Personal review of imaging studies, reports of other /same  Imaging studies, results of polysomnography and / or neurophysiology testing and pre-existing records as far as provided in visit., my assessment is   1)  Jason Summers reports a  headache that could be best described as a pressure headache and arises in the morning and may stay with him all day. He also feels it as pressure behind the eye and in the temple, there is no associated vision change, he does not report tunnel vision. He is usually not nauseated. It does not does seem to be a migraine type headache, but rather a possible hypercapnia related headache low pressure headache. He does have a history of sinusitis. I would like to obtain an overnight PSG  to  screen for OSA with  hypoxemia at night. Given that he has lung cancer and had pneumonitis and radiation-induced other problems he has a higher chance of having sleep apnea and his wife has noticed him to breathe irregularly sometimes with crescendo snoring. I would like to invite him for a polysomnography and in addition we'll try Periactin as a preventative medication with him.    The patient was advised of the nature of the diagnosed disorder , the treatment options and the  risks for general health and wellness arising from not treating the condition.   I spent more than 55 minutes of face to face time with the patient.  Greater than 50% of time was spent in counseling and coordination of care. We have discussed the diagnosis and differential and I answered the patient's questions.    Plan:  Treatment plan and additional workup : PSG with hypoxemia watch  Pericactin as a trial for nocturnal and sleep related headaches.  Runny nose in AM.  Mouth breathing - dry mouth.    Larey Seat, MD 28/0/0349, 1:79 PM  Certified in Neurology by [employer]BP[employer] Certified in [place] by [facility] [address] [place], [place] [address]

## 2017-03-16 NOTE — Patient Instructions (Signed)
Cyproheptadine tablets What is this medicine? CYPROHEPTADINE (si proe HEP ta deen) is a antihistamine. This medicine is used to treat allergy symptoms. It is can help stop runny nose, watery eyes, and itchy rash. This medicine may be used for other purposes; ask your health care provider or pharmacist if you have questions. COMMON BRAND NAME(S): Periactin What should I tell my health care provider before I take this medicine? They need to know if you have any of these conditions: -any chronic disease -glaucoma -prostate disease -ulcers or other stomach problems -an unusual or allergic reaction to cyproheptadine, other medicines foods, dyes, or preservatives -pregnant or trying to get pregnant -breast-feeding How should I use this medicine? Take this medicine by mouth with a glass of water. Follow the directions on the prescription label. Take your doses at regular intervals. Do not take your medicine more often than directed. Talk to your pediatrician regarding the use of this medicine in children. While this drug may be prescribed for children as young as 10 years of age for selected conditions, precautions do apply. Overdosage: If you think you have taken too much of this medicine contact a poison control center or emergency room at once. NOTE: This medicine is only for you. Do not share this medicine with others. What if I miss a dose? If you miss a dose, take it as soon as you can. If it is almost time for your next dose, take only that dose. Do not take double or extra doses. What may interact with this medicine? Do not take this medicine with any of the following medications: -MAOIs like Carbex, Eldepryl, Marplan, Nardil, and Parnate This medicine may also interact with the following medications: -alcohol -barbiturate medicines for inducing sleep or treating seizures -medicines for depression, anxiety or psychotic disturbances -medicines for movement abnormalities -medicines for  sleep -medicines for stomach problems -some medicines for cold or allergies This list may not describe all possible interactions. Give your health care provider a list of all the medicines, herbs, non-prescription drugs, or dietary supplements you use. Also tell them if you smoke, drink alcohol, or use illegal drugs. Some items may interact with your medicine. What should I watch for while using this medicine? Visit your doctor or health care professional for regular check ups. Tell your doctor if your symptoms do not improve or if they get worse. You may get drowsy or dizzy. Do not drive, use machinery, or do anything that needs mental alertness until you know how this medicine affects you. Do not stand or sit up quickly, especially if you are an older patient. This reduces the risk of dizzy or fainting spells. Alcohol may interfere with the effect of this medicine. Avoid alcoholic drinks. Your mouth may get dry. Chewing sugarless gum or sucking hard candy, and drinking plenty of water may help. Contact your doctor if the problem does not go away or is severe. This medicine may cause dry eyes and blurred vision. If you wear contact lenses you may feel some discomfort. Lubricating drops may help. See your eye doctor if the problem does not go away or is severe. This medicine can make you more sensitive to the sun. Keep out of the sun. If you cannot avoid being in the sun, wear protective clothing and use sunscreen. Do not use sun lamps or tanning beds/booths. What side effects may I notice from receiving this medicine? Side effects that you should report to your doctor or health care professional as soon as possible: -  allergic reactions like skin rash, itching or hives, swelling of the face, lips, or tongue -agitation, nervousness, excitability, not able to sleep -chest pain -irregular, fast heartbeat -pain or difficulty passing urine -seizures -unusual bleeding or bruising -unusually weak or  tired -yellowing of the eyes or skin Side effects that usually do not require medical attention (report to your doctor or health care professional if they continue or are bothersome): -constipation or diarrhea -headache -loss of appetite -nausea, vomiting -stomach upset -weight gain This list may not describe all possible side effects. Call your doctor for medical advice about side effects. You may report side effects to FDA at 1-800-FDA-1088. Where should I keep my medicine? Keep out of the reach of children. Store at room temperature between 15 and 30 degrees C (59 and 86 degrees F). Keep container tightly closed. Throw away any unused medicine after the expiration date. NOTE: This sheet is a summary. It may not cover all possible information. If you have questions about this medicine, talk to your doctor, pharmacist, or health care provider.  2018 Elsevier/Gold Standard (2007-08-30 16:29:53) Sleep Studies A sleep study (polysomnogram) is a series of tests done while you are sleeping. It can show how well you sleep. This can help your health care provider diagnose a sleep disorder and show how severe your sleep disorder is. A sleep study may lead to treatment that will help you sleep better and prevent other medical problems caused by poor sleep. If you have a sleep disorder, you may also be at risk for:  Sleep-related accidents.  High blood pressure.  Heart disease.  Stroke.  Other medical conditions.  Sleep disorders are common. Your health care provider may suspect a sleep disorder if you:  Have loud snoring most nights.  Have brief periods when you stop breathing at night.  Feel sleepy on most days.  Fall asleep suddenly during the day.  Have trouble falling asleep or staying asleep.  Feel like you need to move your legs when trying to fall asleep.  Have dreams that seem very real shortly after falling asleep.  Feel like you cannot move when you first wake  up.  Which tests will I need to have? Most sleep studies last all night and include these tests:  Recordings of your brain activity.  Recordings of your eye movements.  Recording of your heart rate and rhythm.  Blood pressure readings.  Readings of the amount of oxygen in your blood.  Measurements of your chest and belly movement as you breathe during sleep.  If you have signs of the sleep disorder called sleep apnea during your test, you may get a mask to wear for the second half of the night.  The mask provides continuous positive airway pressure (CPAP). This may improve sleep apnea significantly.  You will then have all tests done again with the mask in place to see if your measurements and recordings change.  How are sleep studies done? Most sleep studies are done over one full night of sleep.  You will arrive at the study center in the evening and can go home in the morning.  Bring your pajamas and toothbrush.  Do not have caffeine on the day of your sleep study.  Your health care provider will let you know if you need to stop taking any of your regular medicines before the test.  To do the tests included in a polysomnogram, you will have:  Round, sticky patches with sensors attached to recording wires (electrodes)  placed on your scalp, face, chest, and limbs.  Wires from all the electrodes and sensors run from your bed to a computer. The wires can be taken off and put back on if you need to get out of bed to go to the bathroom.  A sensor placed over your nose to measure airflow.  A finger clip put on one finger to measure your blood oxygen level.  A belt around your belly and a belt around your chest to measure breathing movements.  Where are sleep studies done? Sleep studies are done at sleep centers. A sleep center may be inside a hospital, office, or clinic. The room where you have the study may look like a hospital room or a hotel room. The health care  providers doing the study may come in and out of the room during the study. Most of the time, they will be in another room monitoring your test. How is information from sleep studies helpful? A polysomnogram can be used along with your medical history and a physical exam to diagnose conditions, such as:  Sleep apnea.  Restless legs syndrome.  Sleep-related seizure disorders.  Sleep-related movement disorders.  A medical doctor who specializes in sleep will evaluate your sleep study. The specialist will share the results with your primary health care provider. Treatments based on your sleep study may include:  Improving your sleep habits (sleep hygiene).  Wearing a CPAP mask.  Wearing an oral device at night to improve breathing and reduce snoring.  Taking medicine for: ? Restless legs syndrome. ? Sleep-related seizure disorder. ? Sleep-related movement disorder.  This information is not intended to replace advice given to you by your health care provider. Make sure you discuss any questions you have with your health care provider. Document Released: 11/30/2002 Document Revised: 01/20/2016 Document Reviewed: 08/01/2013 Elsevier Interactive Patient Education  [employer].

## 2017-03-23 ENCOUNTER — Telehealth: Payer: Self-pay | Admitting: Internal Medicine

## 2017-03-23 NOTE — Telephone Encounter (Signed)
Tamsulosin 0.4 MG Capsules after 3 days pt began to have side effects, pt c/o unable to standup straight and dizziness.   Any other medication that pt can take? Please advise

## 2017-03-23 NOTE — Telephone Encounter (Signed)
Please have patient discontinue the medication

## 2017-03-23 NOTE — Telephone Encounter (Signed)
Pts wife is calling stating that the pt was not able to take the medication tamsulosin 0.4 MG Capsules after 3 days and would like to know what he should do.  Pts wife would like to have a call back.

## 2017-03-26 NOTE — Telephone Encounter (Signed)
Pt was made aware.

## 2017-03-27 ENCOUNTER — Encounter: Payer: Self-pay | Admitting: Internal Medicine

## 2017-03-27 ENCOUNTER — Ambulatory Visit (INDEPENDENT_AMBULATORY_CARE_PROVIDER_SITE_OTHER): Payer: Medicare Other | Admitting: Internal Medicine

## 2017-03-27 ENCOUNTER — Other Ambulatory Visit (INDEPENDENT_AMBULATORY_CARE_PROVIDER_SITE_OTHER): Payer: Medicare Other

## 2017-03-27 ENCOUNTER — Telehealth (HOSPITAL_COMMUNITY): Payer: Self-pay

## 2017-03-27 VITALS — BP 116/68 | HR 85 | Ht 67.0 in | Wt 170.0 lb

## 2017-03-27 DIAGNOSIS — R5382 Chronic fatigue, unspecified: Secondary | ICD-10-CM

## 2017-03-27 DIAGNOSIS — E559 Vitamin D deficiency, unspecified: Secondary | ICD-10-CM | POA: Diagnosis not present

## 2017-03-27 DIAGNOSIS — R5381 Other malaise: Secondary | ICD-10-CM | POA: Diagnosis not present

## 2017-03-27 DIAGNOSIS — J90 Pleural effusion, not elsewhere classified: Secondary | ICD-10-CM | POA: Diagnosis not present

## 2017-03-27 DIAGNOSIS — J439 Emphysema, unspecified: Secondary | ICD-10-CM

## 2017-03-27 DIAGNOSIS — C3492 Malignant neoplasm of unspecified part of left bronchus or lung: Secondary | ICD-10-CM

## 2017-03-27 LAB — CBC WITH DIFFERENTIAL/PLATELET
Basophils Absolute: 0.1 10*3/uL (ref 0.0–0.1)
Basophils Relative: 1.1 % (ref 0.0–3.0)
EOS ABS: 0.2 10*3/uL (ref 0.0–0.7)
Eosinophils Relative: 2.4 % (ref 0.0–5.0)
HCT: 43.7 % (ref 39.0–52.0)
HEMOGLOBIN: 14.5 g/dL (ref 13.0–17.0)
LYMPHS ABS: 1.2 10*3/uL (ref 0.7–4.0)
Lymphocytes Relative: 15.6 % (ref 12.0–46.0)
MCHC: 33.2 g/dL (ref 30.0–36.0)
MCV: 88.4 fl (ref 78.0–100.0)
MONO ABS: 0.9 10*3/uL (ref 0.1–1.0)
Monocytes Relative: 11.6 % (ref 3.0–12.0)
NEUTROS PCT: 69.3 % (ref 43.0–77.0)
Neutro Abs: 5.5 10*3/uL (ref 1.4–7.7)
Platelets: 210 10*3/uL (ref 150.0–400.0)
RBC: 4.95 Mil/uL (ref 4.22–5.81)
RDW: 14 % (ref 11.5–15.5)
WBC: 8 10*3/uL (ref 4.0–10.5)

## 2017-03-27 LAB — MAGNESIUM: MAGNESIUM: 2.2 mg/dL (ref 1.5–2.5)

## 2017-03-27 LAB — BASIC METABOLIC PANEL
BUN: 20 mg/dL (ref 6–23)
CO2: 25 meq/L (ref 19–32)
Calcium: 9 mg/dL (ref 8.4–10.5)
Chloride: 101 mEq/L (ref 96–112)
Creatinine, Ser: 0.86 mg/dL (ref 0.40–1.50)
GFR: 90.2 mL/min (ref >60.00)
GLUCOSE: 96 mg/dL (ref 70–99)
POTASSIUM: 4.4 meq/L (ref 3.5–5.1)
SODIUM: 136 meq/L (ref 135–145)

## 2017-03-27 LAB — HEPATIC FUNCTION PANEL
ALBUMIN: 3.9 g/dL (ref 3.5–5.2)
ALK PHOS: 78 U/L (ref 39–117)
ALT: 11 U/L (ref 0–53)
AST: 14 U/L (ref 0–37)
Bilirubin, Direct: 0.1 mg/dL (ref 0.0–0.3)
TOTAL PROTEIN: 7.3 g/dL (ref 6.0–8.3)
Total Bilirubin: 0.4 mg/dL (ref 0.2–1.2)

## 2017-03-27 LAB — PHOSPHORUS: Phosphorus: 3.3 mg/dL (ref 2.3–4.6)

## 2017-03-27 LAB — VITAMIN D 25 HYDROXY (VIT D DEFICIENCY, FRACTURES): VITD: 14.72 ng/mL — ABNORMAL LOW (ref 30.00–100.00)

## 2017-03-27 NOTE — Progress Notes (Signed)
Subjective:     Patient ID: Jason Summers, male   DOB: 02/15/1932, 83 y.o.   MRN: [id number]  HPI  IOV 12/16/2016  Chief Complaint  Patient presents with  . Acute Visit    Pt c/o chest tightness, mid back pain, and right lateral rib pain when pt takes a deep breath in. Pt also c/o mild dry cough with some chest congestion. Pt denies f/c/s.     83 year old male with stage III non-small cell lung cancer and resultant radiation related pleural effusion. This supposed to go to [place] native country in June 2018 but he canceled because of his declining health. He tells me that his functional status is pretty good. His appetite is pretty good. However he is having worsening lower thoracic back pain. He saw my colleague approximately one month ago. Chest x-ray was unchanged. He was advised nonsteroidal anti-inflammatory drugs and Tylenol for pain relief. But now the pain has broken through. The pain is worse. It is present in the midthoracic right infra-axillary and lower anterior chest area. It gets worse with inspiration. It is severe. He feels he might need something stronger for pain relief. There is no neurologic deficit. There is no constipation. His effort tolerance is okay with there is slight increased fatigue.   OV 03/27/2017  Chief Complaint  Patient presents with  . Follow-up    Pt still has some pain in his back but it is better than it ws at last visit. Pt has noticed that he will occ. lose his voice and he has become weak, especially in the mornings. [name] states that pt has been taking some OTC for a cough and states that occ. it will be hard for him to breathe and does have occ. CP.    83 year old male with non-small cell lung cancer on observation therapy. I last saw him in July 2018. At that time he was bothered by left-sided chest pain. There was concern about cancer recurrence but a PET scan at that time showed improvement. Since then he's been on observation therapy.He now  presents with his wife. He tells me that the pain is not much of an issue. He only has very mild shortness of breath. But they both tell me that he is extremely fatigued. Particularly early in the morning. He has insomnia. Wife does think that he is deconditioned. Last lab work in August 2018 was fine. Has no symptoms of COPD exacerbation. He is up-to-date with his flu shot.he has had some headaches and neurology for that He might be depressed Walking desaturation test 185 feet 3 laps on room air: He finished it without any problems other than mild shortness of breath. Resting heart rate was 87/m. Final heart rate was 98/m. Resting pulse ox 100% final pulse ox 98%.    Lung cancer DIAGNOSIS: Stage IIIA (T3, N2, M0) non-small cell lung cancer, poorly differentiated squamous cell carcinoma diagnosed in February 2017 with PDL 1 expression of 35%, presented with large left lower lobe lung mass in addition to left upper lobe lung nodule and mediastinal lymphadenopathy.PRIOR THERAPY: Course of concurrent chemoradiation with weekly carboplatin for AUC of 2 and paclitaxel 45 MG/M2. Status post 7 cycles, last dose was given 11/19/2015 with partial response.     has a past medical history of Arthritis; Cancer ([place]); Hemorrhoids; Lateral epicondylitis (tennis elbow); Non-small cell lung cancer (NSCLC) ([place]) (dx'f 07/30/15); Odynophagia (11/06/2015); and Spondylosis of cervical joint.   reports that he quit smoking about 6 years ago.  His smoking use included Cigarettes. He has a 12.40 pack-year smoking history. He has never used smokeless tobacco.  Past Surgical History:  Procedure Laterality Date  . COLONOSCOPY  2003  . KNEE ARTHROSCOPY  2006   right    Allergies  Allergen Reactions  . Gentamicin Other (See Comments)    Made eyes red, swollen, hot feeling  . Loteprednol-Tobramycin     Other reaction(s): Other (See Comments) Made eyes Red, Swollen, Red, Warm feeling     Immunization History   Administered Date(s) Administered  . Influenza Split 03/19/2011, 03/12/2012  . Influenza Whole 06/09/1997, 04/22/2007, 03/14/2008, 03/28/2009, 05/09/2010  . Influenza, High Dose Seasonal PF 04/14/2013, 04/11/2015, 03/02/2017  . Influenza,inj,Quad PF,6+ Mos 05/07/2016  . Influenza-Unspecified 04/10/2014  . Pneumococcal Conjugate-13 10/14/2013  . Pneumococcal Polysaccharide-23 06/09/2004, 10/04/2012  . Td 06/09/2005  . Zoster 02/04/2015    Family History  Problem Relation Age of Onset  . Hypertension Mother   . Heart disease Mother   . Coronary artery disease Unknown      Current Outpatient Prescriptions:  .  aspirin 81 MG EC tablet, Take 81 mg by mouth every other day. Reported on 10/16/2015, Disp: , Rfl:  .  cyproheptadine (PERIACTIN) 4 MG tablet, Take 1 tablet (4 mg total) by mouth at bedtime., Disp: 30 tablet, Rfl: 0 .  esomeprazole (NEXIUM) 40 MG capsule, Take 1 capsule (40 mg total) by mouth daily at 12 noon., Disp: 90 capsule, Rfl: 3 .  fish oil-omega-3 fatty acids 1000 MG capsule, Take 2 g by mouth daily. , Disp: , Rfl:  .  glucosamine-chondroitin 500-400 MG tablet, Take 1 tablet by mouth daily. Reported on 09/11/2015, Disp: , Rfl:     Review of Systems     Objective:   Physical Exam  Constitutional: He is oriented to person, place, and time. He appears well-developed and well-nourished. No distress.  HENT:  Head: Normocephalic and atraumatic.  Right Ear: External ear normal.  Left Ear: External ear normal.  Mouth/Throat: Oropharynx is clear and moist. No oropharyngeal exudate.  Eyes: Pupils are equal, round, and reactive to light. Conjunctivae and EOM are normal. Right eye exhibits no discharge. Left eye exhibits no discharge. No scleral icterus.  Neck: Normal range of motion. Neck supple. No JVD present. No tracheal deviation present. No thyromegaly present.  Cardiovascular: Normal rate, regular rhythm and intact distal pulses.  Exam reveals no gallop and no friction  rub.   No murmur heard. Pulmonary/Chest: Effort normal and breath sounds normal. No respiratory distress. He has no wheezes. He has no rales. He exhibits no tenderness.  Slightly diminished air entry on the left side  Abdominal: Soft. Bowel sounds are normal. He exhibits no distension and no mass. There is no tenderness. There is no rebound and no guarding.  Musculoskeletal: Normal range of motion. He exhibits no edema or tenderness.  Lymphadenopathy:    He has no cervical adenopathy.  Neurological: He is alert and oriented to person, place, and time. He has normal reflexes. No cranial nerve deficit. Coordination normal.  Skin: Skin is warm and dry. No rash noted. He is not diaphoretic. No erythema. No pallor.  Psychiatric: He has a normal mood and affect. His behavior is normal. Judgment and thought content normal.  Nursing note and vitals reviewed.   Vitals:   03/27/17 0949  BP: 116/68  Pulse: 85  SpO2: 96%  Weight: 170 lb (77.1 kg)  Height: _0  (1.702 m)    Estimated body mass index is  26.63 kg/m as calculated from the following:   Height as of this encounter: _0  (1.702 m).   Weight as of this encounter: 170 lb (77.1 kg).      Assessment:       ICD-10-CM   1. Chronic fatigue R53.82   2. Pulmonary emphysema, unspecified emphysema type ([place]) J43.9   3. Pleural effusion on left J90   4. Vitamin D deficiency E55.9   5. Physical deconditioning R53.81   6. Non-small cell cancer of left lung (H[facility]) C34.92        Plan:      Do cxr 2 view Do blood work cbc, bmet, mag, phos, lft and vitamin D level Refer pulmonary rehab for emphysema, lung cancer and fatigue / deconditioning  FOllowup = will call with results - 2-3 months or sooner if neede   > 50% of this > 25 min visit spent in face to face counseling or coordination of care      Dr. Brand Males, M.D., [facility].C.P Pulmonary and Critical Care Medicine Staff Physician [facility] [facility] Pager: [phone], If no answer or between  15:00h - 7:00h: call [phone]  03/27/2017 10:22 AM

## 2017-03-27 NOTE — Telephone Encounter (Signed)
Patients wife returned phone call in regards to Pulmonary Rehab - Scheduled orientation for 04/20/17 at 9:30am. He will be attending the 10:30am exercise class.

## 2017-03-27 NOTE — Patient Instructions (Addendum)
ICD-10-CM   1. Chronic fatigue R53.82   2. Pulmonary emphysema, unspecified emphysema type ([place]) J43.9   3. Pleural effusion on left J90   4. Vitamin D deficiency E55.9   5. Physical deconditioning R53.81   6. Non-small cell cancer of left lung (HCC) C34.92    Do cxr 2 view Do blood work cbc, bmet, mag, phos, lft and vitamin D level Refer pulmonary rehab for emphysema, lung cancer and fatigue / deconditioning  FOllowup = will call with results - 2-3 months or sooner if needed

## 2017-03-27 NOTE — Telephone Encounter (Signed)
Verified insurance. [employer] - No co-payment, deductible amount is $183.00/$183.00 has been met, out of pocket amount is $1,523/$183.00 has been met, no co-insurance, and no pre-authorization is required. Patient is covered at 100%. Reference # [id number]  Will call patient to schedule orientation.

## 2017-03-27 NOTE — Telephone Encounter (Signed)
Attempted to call patient in regards to Pulmonary Rehab. LMTCB

## 2017-03-30 ENCOUNTER — Telehealth: Payer: Self-pay | Admitting: Internal Medicine

## 2017-03-30 NOTE — Telephone Encounter (Signed)
Let Jason Summers know all labs normal except VERY LOW VIT D  VITD 30.00 - 100.00 ng/mL 14.72       PLAN - vitamin D3 50,000 (50k) units once a week x 12 weeks, then once a month on first of each month  - this cannot be vitamin d2. But has to to be Vit D3  - If pharmacy only has vitamin d2 let me know, then he should get replesta (spl order  vit d3) at same dose - replesta can be obtained by following instructions at [email or web address] - he should follow for this with PCP [name], MD - also refer pulmonary rehab - if not made already due to dyspnea, emphysema, lung cancer, cancer fatigue    Dr. Brand Males, M.D., [facility].C.P Pulmonary and Critical Care Medicine Staff Physician [facility] Pager: [phone], If no answer or between  15:00h - 7:00h: call [phone]  03/30/2017 9:30 AM       PULMONARY No results for input(s): PHART, PCO2ART, PO2ART, HCO3, TCO2, O2SAT in the last 168 hours.  Invalid input(s): PCO2, PO2  CBC  Recent Labs Lab 03/27/17 1037  HGB 14.5  HCT 43.7  WBC 8.0  PLT 210.0    COAGULATION No results for input(s): INR in the last 168 hours.  CARDIAC  No results for input(s): TROPONINI in the last 168 hours. No results for input(s): PROBNP in the last 168 hours.   CHEMISTRY  Recent Labs Lab 03/27/17 1037  NA 136  K 4.4  CL 101  CO2 25  GLUCOSE 96  BUN 20  CREATININE 0.86  CALCIUM 9.0  MG 2.2  PHOS 3.3   Estimated Creatinine Clearance: 60.8 mL/min (by C-G formula based on SCr of 0.86 mg/dL).   LIVER  Recent Labs Lab 03/27/17 1037  AST 14  ALT 11  ALKPHOS 78  BILITOT 0.4  PROT 7.3  ALBUMIN 3.9     INFECTIOUS No results for input(s): LATICACIDVEN, PROCALCITON in the last 168 hours.   ENDOCRINE CBG (last 3)  No results for input(s): GLUCAP in the last 72 hours.       IMAGING x48h  - image(s) personally visualized  -   highlighted in bold No results  found.

## 2017-04-01 NOTE — Telephone Encounter (Signed)
[name] is calling back and says they are now at home and can be reached.-tr

## 2017-04-01 NOTE — Telephone Encounter (Signed)
[name] returning Emily's call.  [phone]

## 2017-04-01 NOTE — Telephone Encounter (Signed)
Left message for pt to call us back. 

## 2017-04-01 NOTE — Telephone Encounter (Signed)
ATC pt, no answer. Left message for pt to call back.

## 2017-04-02 MED ORDER — VITAMIN D3 1.25 MG (50000 UT) PO TABS: 50000.0000 [IU] | ORAL_TABLET | ORAL | 0 refills | Status: DC

## 2017-04-02 NOTE — Telephone Encounter (Signed)
Patient wife returning phone call; contact # [phone]

## 2017-04-02 NOTE — Telephone Encounter (Signed)
Spoke with wife and advised message from MR. [facility] Nothing further is needed. The referral for Pulm rehab is already in process.

## 2017-04-13 ENCOUNTER — Telehealth: Payer: Self-pay | Admitting: Internal Medicine

## 2017-04-13 NOTE — Telephone Encounter (Signed)
Spoke with patient's wife. Advised her of the Vitamin D3 instruction MR wrote. She verbalized understanding. Nothing else needed at time of call.

## 2017-04-14 ENCOUNTER — Telehealth: Payer: Self-pay | Admitting: Internal Medicine

## 2017-04-14 MED ORDER — VITAMIN D3 1.25 MG (50000 UT) PO TABS: 50000.0000 [IU] | ORAL_TABLET | ORAL | 0 refills | Status: DC

## 2017-04-14 NOTE — Telephone Encounter (Signed)
RX has been sent to pharmacy. Will close this encounter.

## 2017-04-20 ENCOUNTER — Encounter (HOSPITAL_COMMUNITY)
Admission: RE | Admit: 2017-04-20 | Discharge: 2017-04-20 | Disposition: A | Discharge status: Home / Self Care | Payer: Medicare Other | Source: Ambulatory Visit | Attending: Internal Medicine | Admitting: Internal Medicine

## 2017-04-20 ENCOUNTER — Encounter (HOSPITAL_COMMUNITY): Payer: Self-pay

## 2017-04-20 VITALS — BP 128/80 | HR 90 | Ht 67.0 in | Wt 172.8 lb

## 2017-04-20 DIAGNOSIS — Z85828 Personal history of other malignant neoplasm of skin: Secondary | ICD-10-CM | POA: Diagnosis not present

## 2017-04-20 DIAGNOSIS — Z7982 Long term (current) use of aspirin: Secondary | ICD-10-CM | POA: Diagnosis not present

## 2017-04-20 DIAGNOSIS — Z79899 Other long term (current) drug therapy: Secondary | ICD-10-CM | POA: Insufficient documentation

## 2017-04-20 DIAGNOSIS — Z87891 Personal history of nicotine dependence: Secondary | ICD-10-CM | POA: Diagnosis not present

## 2017-04-20 DIAGNOSIS — J439 Emphysema, unspecified: Secondary | ICD-10-CM

## 2017-04-20 DIAGNOSIS — M1990 Unspecified osteoarthritis, unspecified site: Secondary | ICD-10-CM | POA: Diagnosis not present

## 2017-04-20 NOTE — Progress Notes (Signed)
Jason Summers 83 y.o. male Pulmonary Rehab Orientation Note Patient arrived today in Cardiac and Pulmonary Rehab for orientation to Pulmonary Rehab. He walked with his wife from the [employer]. He does not carry portable oxygen. Per pt, he uses oxygen never. Color good, skin warm and dry. Patient is oriented to time and place. Patient's medical history, psychosocial health, and medications reviewed. Psychosocial assessment reveals pt lives with their spouse. Pt is currently retired as a Film/video editor. Pt hobbies include woodworking. Pt reports his stress level is low. Areas of stress/anxiety include Health. He was diagnosed with lung cancer approximately 2 years ago, went through radiation and has been deconditioned since  Pt does not exhibit  signs of depression. PHQ2/9 score 0/0. Pt shows good  coping skills with positive outlook . Will continue to monitor and evaluate progress toward psychosocial goal(s) of regaining strength and stamina. Physical assessment reveals heart rate is normal, breath sounds clear to auscultation, no wheezes, rales, or rhonchi. Grip strength equal, strong. Distal pulses 3+ bilateral posterior tibial pulses present. Patient reports he does take medications as prescribed. Patient states he follows a Regular diet. The patient reports no specific efforts to gain or lose weight.. Patient's weight will be monitored closely. Demonstration and practice of PLB using pulse oximeter. Patient able to return demonstration satisfactorily. Safety and hand hygiene in the exercise area reviewed with patient. Patient voices understanding of the information reviewed. Department expectations discussed with patient and achievable goals were set. The patient shows enthusiasm about attending the program and we look forward to working with this nice gentleman. The patient is scheduled for a 6 min walk test on Tuesday, April 21, 2017 @ 3:15 pm and to begin exercise on Tuesday, April 28, 2017 in the  1030 class.   [id number]

## 2017-04-21 ENCOUNTER — Encounter (HOSPITAL_COMMUNITY)
Admission: RE | Admit: 2017-04-21 | Discharge: 2017-04-21 | Disposition: A | Discharge status: Home / Self Care | Payer: Medicare Other | Source: Ambulatory Visit | Attending: Internal Medicine | Admitting: Internal Medicine

## 2017-04-21 DIAGNOSIS — J439 Emphysema, unspecified: Secondary | ICD-10-CM | POA: Diagnosis not present

## 2017-04-23 ENCOUNTER — Encounter (HOSPITAL_COMMUNITY): Payer: Self-pay | Admitting: *Deleted

## 2017-04-23 NOTE — Progress Notes (Signed)
Pulmonary Individual Treatment Plan  Patient Details  Name: Jason Summers MRN: [id number] Date of Birth: 28-May-1932 Referring Provider:     Pulmonary Rehab Walk Test from 04/21/2017 in [place]  Referring Provider  Dr. Chase Caller      Initial Encounter Date:    Pulmonary Rehab Walk Test from 04/21/2017 in [place]  Date  04/23/17  Referring Provider  Dr. Chase Caller      Visit Diagnosis: Pulmonary emphysema, unspecified emphysema type ([place])  Patient's Home Medications on Admission:   Current Outpatient Medications:  .  aspirin 81 MG EC tablet, Take 81 mg by mouth every other day. Reported on 10/16/2015, Disp: , Rfl:  .  Cholecalciferol (VITAMIN D3) 50000 units TABS, Take 50,000 Units once a week by mouth. Then take 50,000 monthly after 12 weeks., Disp: 51 tablet, Rfl: 0 .  cyproheptadine (PERIACTIN) 4 MG tablet, Take 1 tablet (4 mg total) by mouth at bedtime., Disp: 30 tablet, Rfl: 0 .  esomeprazole (NEXIUM) 40 MG capsule, Take 1 capsule (40 mg total) by mouth daily at 12 noon., Disp: 90 capsule, Rfl: 3 .  fish oil-omega-3 fatty acids 1000 MG capsule, Take 2 g by mouth daily. , Disp: , Rfl:  .  glucosamine-chondroitin 500-400 MG tablet, Take 1 tablet by mouth daily. Reported on 09/11/2015, Disp: , Rfl:   Past Medical History: Past Medical History:  Diagnosis Date  . Arthritis   . Cancer ([place])    basal cell on right temple  . Hemorrhoids   . Lateral epicondylitis (tennis elbow)   . Non-small cell lung cancer (NSCLC) ([place]) dx'f 07/30/15  . Odynophagia 11/06/2015  . Spondylosis of cervical joint     Tobacco Use: Social History   Tobacco Use  Smoking Status Former Smoker  . Packs/day: 0.20  . Years: 62.00  . Pack years: 12.40  . Types: Cigarettes  . Last attempt to quit: 03/10/2011  . Years since quitting: 6.1  Smokeless Tobacco Never Used  Tobacco Comment   smokes 1 ciagettes occasionally    Labs: Recent  Review Flowsheet Data    Labs for ITP Cardiac and Pulmonary Rehab Latest Ref Rng & Units 08/21/2011 10/04/2012 10/07/2013 10/20/2014 03/02/2017   Cholestrol 0 - 200 mg/dL 201(H) 180 180 192 177   LDLCALC 0 - 99 mg/dL - 115(H) 116(H) 110(H) 112(H)   LDLDIRECT mg/dL 118.5 - - - -   HDL >39.00 mg/dL 54.10 52.70 50.80 61.60 46.40   Trlycerides 0.0 - 149.0 mg/dL 91.0 63.0 67.0 104.0 90.0      Capillary Blood Glucose: Lab Results  Component Value Date   GLUCAP 97 12/30/2016   GLUCAP 103 (H) 09/05/2015     Pulmonary Assessment Scores: Pulmonary Assessment Scores    Row Name 04/23/17 0721         ADL UCSD   ADL Phase  Entry       mMRC Score   mMRC Score  1        Pulmonary Function Assessment: Pulmonary Function Assessment - 04/20/17 1021      Breath   Bilateral Breath Sounds  Clear    Shortness of Breath  Yes;Limiting activity       Exercise Target Goals: Date: 04/23/17  Exercise Program Goal: Individual exercise prescription set with THRR, safety & activity barriers. Participant demonstrates ability to understand and report RPE using BORG scale, to self-measure pulse accurately, and to acknowledge the importance of the exercise prescription.  Exercise Prescription  Goal: Starting with aerobic activity 30 plus minutes a day, 3 days per week for initial exercise prescription. Provide home exercise prescription and guidelines that participant acknowledges understanding prior to discharge.  Activity Barriers & Risk Stratification: Activity Barriers & Cardiac Risk Stratification - 04/20/17 1024      Activity Barriers & Cardiac Risk Stratification   Activity Barriers  None       6 Minute Walk: 6 Minute Walk    Row Name 04/23/17 0716         6 Minute Walk   Phase  Initial     Distance  1342 feet     Walk Time  6 minutes     # of Rest Breaks  0     MPH  2.54     METS  2.91     RPE  12     Perceived Dyspnea   2     Symptoms  No     Resting HR  89 bpm     Resting  BP  152/81     Resting Oxygen Saturation   94 %     Exercise Oxygen Saturation  during 6 min walk  90 %     Max Ex. HR  111 bpm     Max Ex. BP  178/82     2 Minute Post BP  148/88       Interval HR   1 Minute HR  99     2 Minute HR  105     3 Minute HR  108     4 Minute HR  111     5 Minute HR  112     6 Minute HR  113     2 Minute Post HR  106     Interval Heart Rate?  Yes       Interval Oxygen   Interval Oxygen?  Yes     Baseline Oxygen Saturation %  94 %     1 Minute Oxygen Saturation %  95 %     1 Minute Liters of Oxygen  0 L     2 Minute Oxygen Saturation %  95 %     2 Minute Liters of Oxygen  0 L     3 Minute Oxygen Saturation %  92 %     3 Minute Liters of Oxygen  0 L     4 Minute Oxygen Saturation %  91 %     4 Minute Liters of Oxygen  0 L     5 Minute Oxygen Saturation %  90 %     5 Minute Liters of Oxygen  0 L     6 Minute Oxygen Saturation %  90 %     6 Minute Liters of Oxygen  0 L     2 Minute Post Oxygen Saturation %  92 %     2 Minute Post Liters of Oxygen  0 L        Oxygen Initial Assessment: Oxygen Initial Assessment - 04/23/17 0716      Initial 6 min Walk   Oxygen Used  None      Program Oxygen Prescription   Program Oxygen Prescription  None       Oxygen Re-Evaluation:   Oxygen Discharge (Final Oxygen Re-Evaluation):   Initial Exercise Prescription: Initial Exercise Prescription - 04/23/17 0700      Date of Initial Exercise RX and Referring Provider   Date  04/23/17  Referring Provider  Dr. Chase Caller      Treadmill   MPH  1.7    Grade  0    Minutes  17      NuStep   Level  2    SPM  80    Minutes  17    METs  1.5      Rower   Level  1    Watts  20    Minutes  17      Prescription Details   Frequency (times per week)  2    Duration  Progress to 45 minutes of aerobic exercise without signs/symptoms of physical distress      Intensity   THRR 40-80% of Max Heartrate  55-110    Ratings of Perceived Exertion  11-13     Perceived Dyspnea  0-4      Progression   Progression  Continue progressive overload as per policy without signs/symptoms or physical distress.      Resistance Training   Training Prescription  Yes    Weight  blue bands    Reps  10-15       Perform Capillary Blood Glucose checks as needed.  Exercise Prescription Changes:   Exercise Comments:   Exercise Goals and Review:   Exercise Goals Re-Evaluation :   Discharge Exercise Prescription (Final Exercise Prescription Changes):   Nutrition:  Target Goals: Understanding of nutrition guidelines, daily intake of sodium <1521m, cholesterol <2029m calories 30% from fat and 7% or less from saturated fats, daily to have 5 or more servings of fruits and vegetables.  Biometrics: Pre Biometrics - 04/20/17 1024      Pre Biometrics   Grip Strength  28 kg        Nutrition Therapy Plan and Nutrition Goals:   Nutrition Discharge: Rate Your Plate Scores:   Nutrition Goals Re-Evaluation:   Nutrition Goals Discharge (Final Nutrition Goals Re-Evaluation):   Psychosocial: Target Goals: Acknowledge presence or absence of significant depression and/or stress, maximize coping skills, provide positive support system. Participant is able to verbalize types and ability to use techniques and skills needed for reducing stress and depression.  Initial Review & Psychosocial Screening: Initial Psych Review & Screening - 04/20/17 1028      Initial Review   Current issues with  None Identified      Family Dynamics   Good Support System?  Yes      Barriers   Psychosocial barriers to participate in program  There are no identifiable barriers or psychosocial needs.       Quality of Life Scores:   PHQ-9: Recent Review Flowsheet Data    [facility] 04/20/2017 10/30/2015 10/20/2014 10/14/2013 10/14/2013   Decreased Interest - 0 0 0 0   Down, Depressed, Hopeless 0 0 0 0 0   PHQ - 2 Score 0 0 0 0 0     Interpretation of  Total Score  Total Score Depression Severity:  1-4 = Minimal depression, 5-9 = Mild depression, 10-14 = Moderate depression, 15-19 = Moderately severe depression, 20-27 = Severe depression   Psychosocial Evaluation and Intervention: Psychosocial Evaluation - 04/20/17 1029      Psychosocial Evaluation & Interventions   Interventions  Encouraged to exercise with the program and follow exercise prescription    Continue Psychosocial Services   No Follow up required       Psychosocial Re-Evaluation:   Psychosocial Discharge (Final Psychosocial Re-Evaluation):   Education: Education Goals: Education classes will be provided  on a weekly basis, covering required topics. Participant will state understanding/return demonstration of topics presented.  Learning Barriers/Preferences: Learning Barriers/Preferences - 04/20/17 1020      Learning Barriers/Preferences   Learning Barriers  None    Learning Preferences  Individual Instruction;Skilled Demonstration       Education Topics: Risk Factor Reduction:  -Group instruction that is supported by a PowerPoint presentation. Instructor discusses the definition of a risk factor, different risk factors for pulmonary disease, and how the heart and lungs work together.     Nutrition for Pulmonary Patient:  -Group instruction provided by PowerPoint slides, verbal discussion, and written materials to support subject matter. The instructor gives an explanation and review of healthy diet recommendations, which includes a discussion on weight management, recommendations for fruit and vegetable consumption, as well as protein, fluid, caffeine, fiber, sodium, sugar, and alcohol. Tips for eating when patients are short of breath are discussed.   Pursed Lip Breathing:  -Group instruction that is supported by demonstration and informational handouts. Instructor discusses the benefits of pursed lip and diaphragmatic breathing and detailed demonstration on  how to preform both.     Oxygen Safety:  -Group instruction provided by PowerPoint, verbal discussion, and written material to support subject matter. There is an overview of "What is Oxygen" and "Why do we need it".  Instructor also reviews how to create a safe environment for oxygen use, the importance of using oxygen as prescribed, and the risks of noncompliance. There is a brief discussion on traveling with oxygen and resources the patient may utilize.   Oxygen Equipment:  -Group instruction provided by [facility] Staff utilizing handouts, written materials, and equipment demonstrations.   Signs and Symptoms:  -Group instruction provided by written material and verbal discussion to support subject matter. Warning signs and symptoms of infection, stroke, and heart attack are reviewed and when to call the physician/911 reinforced. Tips for preventing the spread of infection discussed.   Advanced Directives:  -Group instruction provided by verbal instruction and written material to support subject matter. Instructor reviews Advanced Directive laws and proper instruction for filling out document.   Pulmonary Video:  -Group video education that reviews the importance of medication and oxygen compliance, exercise, good nutrition, pulmonary hygiene, and pursed lip and diaphragmatic breathing for the pulmonary patient.   Exercise for the Pulmonary Patient:  -Group instruction that is supported by a PowerPoint presentation. Instructor discusses benefits of exercise, core components of exercise, frequency, duration, and intensity of an exercise routine, importance of utilizing pulse oximetry during exercise, safety while exercising, and options of places to exercise outside of rehab.     Pulmonary Medications:  -Verbally interactive group education provided by instructor with focus on inhaled medications and proper administration.   Anatomy and Physiology of the Respiratory System and  Intimacy:  -Group instruction provided by PowerPoint, verbal discussion, and written material to support subject matter. Instructor reviews respiratory cycle and anatomical components of the respiratory system and their functions. Instructor also reviews differences in obstructive and restrictive respiratory diseases with examples of each. Intimacy, Sex, and Sexuality differences are reviewed with a discussion on how relationships can change when diagnosed with pulmonary disease. Common sexual concerns are reviewed.   MD DAY -A group question and answer session with a medical doctor that allows participants to ask questions that relate to their pulmonary disease state.   OTHER EDUCATION -Group or individual verbal, written, or video instructions that support the educational goals of the pulmonary rehab program.  Knowledge Questionnaire Score:   Core Components/Risk Factors/Patient Goals at Admission: Personal Goals and Risk Factors at Admission - 04/20/17 1027      Core Components/Risk Factors/Patient Goals on Admission   Improve shortness of breath with ADL's  Yes    Intervention  Provide education, individualized exercise plan and daily activity instruction to help decrease symptoms of SOB with activities of daily living.    Expected Outcomes  Short Term: Achieves a reduction of symptoms when performing activities of daily living.    Develop more efficient breathing techniques such as purse lipped breathing and diaphragmatic breathing; and practicing self-pacing with activity  Yes    Intervention  Provide education, demonstration and support about specific breathing techniuqes utilized for more efficient breathing. Include techniques such as pursed lipped breathing, diaphragmatic breathing and self-pacing activity.    Expected Outcomes  Short Term: Participant will be able to demonstrate and use breathing techniques as needed throughout daily activities.    Increase knowledge of  respiratory medications and ability to use respiratory devices properly   Yes    Intervention  Provide education and demonstration as needed of appropriate use of medications, inhalers, and oxygen therapy.    Expected Outcomes  Short Term: Achieves understanding of medications use. Understands that oxygen is a medication prescribed by physician. Demonstrates appropriate use of inhaler and oxygen therapy.       Core Components/Risk Factors/Patient Goals Review:    Core Components/Risk Factors/Patient Goals at Discharge (Final Review):    ITP Comments:   Comments:

## 2017-04-28 ENCOUNTER — Encounter (HOSPITAL_COMMUNITY)
Admission: RE | Admit: 2017-04-28 | Discharge: 2017-04-28 | Disposition: A | Discharge status: Home / Self Care | Payer: Medicare Other | Source: Ambulatory Visit | Attending: Internal Medicine | Admitting: Internal Medicine

## 2017-04-28 VITALS — Wt 172.2 lb

## 2017-04-28 DIAGNOSIS — J439 Emphysema, unspecified: Secondary | ICD-10-CM

## 2017-04-28 NOTE — Progress Notes (Signed)
Daily Session Note  Patient Details  Name: Jason Summers MRN: [id number] Date of Birth: 04-26-1932 Referring Provider:     Pulmonary Rehab Walk Test from 04/21/2017 in [place]  Referring Provider  Dr. Chase Caller      Encounter Date: 04/28/2017  Check In: Session Check In - 04/28/17 1016      Check-In   Location  [facility]    Staff Present  Su Hilt, MS, ACSM RCEP, Exercise Physiologist;Joan Leonia Reeves, RN, Luisa Hart, RN, Roque Cash, RN    Supervising physician immediately available to respond to emergencies  [facility] immediately available    Physician(s)  Dr. Maylene Roes    Medication changes reported      No    Fall or balance concerns reported     No    Tobacco Cessation  No Change    Warm-up and Cool-down  Performed as group-led instruction    Resistance Training Performed  Yes    VAD Patient?  No      Pain Assessment   Currently in Pain?  No/denies    Multiple Pain Sites  No       Capillary Blood Glucose: No results found for this or any previous visit (from the past 24 hour(s)).  Exercise Prescription Changes - 04/28/17 1216      Response to Exercise   Blood Pressure (Admit)  100/60    Blood Pressure (Exercise)  130/74    Blood Pressure (Exit)  110/65    Heart Rate (Admit)  86 bpm    Heart Rate (Exercise)  113 bpm    Heart Rate (Exit)  93 bpm    Oxygen Saturation (Admit)  95 %    Oxygen Saturation (Exercise)  91 %    Oxygen Saturation (Exit)  95 %    Rating of Perceived Exertion (Exercise)  13    Perceived Dyspnea (Exercise)  2    Duration  Progress to 45 minutes of aerobic exercise without signs/symptoms of physical distress    Intensity  Other (comment) HRR 40-80%      Resistance Training   Training Prescription  Yes    Weight  blue bands    Reps  10-15      Treadmill   MPH  1.7    Grade  0    Minutes  17      NuStep   Level  2    SPM  80    Minutes  17    METs  2.2      Rower   Level  1    Watts  20    Minutes  17       Social History   Tobacco Use  Smoking Status Former Smoker  . Packs/day: 0.20  . Years: 62.00  . Pack years: 12.40  . Types: Cigarettes  . Last attempt to quit: 03/10/2011  . Years since quitting: 6.1  Smokeless Tobacco Never Used  Tobacco Comment   smokes 1 ciagettes occasionally    Goals Met:  Queuing for purse lip breathing No report of cardiac concerns or symptoms Strength training completed today  Goals Unmet:  Not Applicable  Comments: Service time is from 1030 to 1210   Dr. Rush Farmer is Medical Director for Pulmonary Rehab at [facility].

## 2017-05-05 ENCOUNTER — Encounter (HOSPITAL_COMMUNITY): Payer: Medicare Other

## 2017-05-05 ENCOUNTER — Telehealth (HOSPITAL_COMMUNITY): Payer: Self-pay | Admitting: Internal Medicine

## 2017-05-07 ENCOUNTER — Telehealth (HOSPITAL_COMMUNITY): Payer: Self-pay | Admitting: Internal Medicine

## 2017-05-07 ENCOUNTER — Encounter (HOSPITAL_COMMUNITY): Payer: Medicare Other

## 2017-05-08 NOTE — Progress Notes (Signed)
Jason Summers 83 y.o. male   DOB: 03-08-1932 MRN: [id number]          Nutrition 1. Pulmonary emphysema, unspecified emphysema type ([place])   2.      Stage III non-small cell Lung CA  Past Medical History:  Diagnosis Date  . Arthritis   . Cancer ([place])    basal cell on right temple  . Hemorrhoids   . Lateral epicondylitis (tennis elbow)   . Non-small cell lung cancer (NSCLC) ([place]) dx'f 07/30/15  . Odynophagia 11/06/2015  . Spondylosis of cervical joint    Meds reviewed.   Ht: Ht Readings from Last 1 Encounters:  04/20/17 _0  (1.702 m)     Wt:  Wt Readings from Last 3 Encounters:  04/28/17 172 lb 2.9 oz (78.1 kg)  04/20/17 172 lb 13.5 oz (78.4 kg)  03/27/17 170 lb (77.1 kg)     BMI: 26.96    Current tobacco use? No  Labs:  Lipid Panel     Component Value Date/Time   CHOL 177 03/02/2017 0942   TRIG 90.0 03/02/2017 0942   HDL 46.40 03/02/2017 0942   CHOLHDL 4 03/02/2017 0942   VLDL 18.0 03/02/2017 0942   LDLCALC 112 (H) 03/02/2017 0942   LDLDIRECT 118.5 08/21/2011 1125    No results found for: HGBA1C  Nutrition Diagnosis ? Food-and nutrition-related knowledge deficit related to lack of exposure to information as related to diagnosis of pulmonary disease ? Overweight related to excessive energy intake as evidenced by a BMI of 26.96  Goal(s) 1. Identify food quantities necessary to achieve desired wt loss at graduation from pulmonary rehab.  Plan:  Pt to attend Pulmonary Nutrition class Will provide client-centered nutrition education as part of interdisciplinary care.   Monitor and evaluate progress toward nutrition goal with team.  Monitor and Evaluate progress toward nutrition goal with team.   Derek Mound, M.Ed, RD, LDN, CDE 05/08/2017 9:31 AM

## 2017-05-12 ENCOUNTER — Encounter (HOSPITAL_COMMUNITY)
Admission: RE | Admit: 2017-05-12 | Discharge: 2017-05-12 | Disposition: A | Discharge status: Home / Self Care | Payer: Medicare Other | Source: Ambulatory Visit | Attending: Internal Medicine | Admitting: Internal Medicine

## 2017-05-12 VITALS — Wt 173.7 lb

## 2017-05-12 DIAGNOSIS — J439 Emphysema, unspecified: Secondary | ICD-10-CM

## 2017-05-12 DIAGNOSIS — Z79899 Other long term (current) drug therapy: Secondary | ICD-10-CM | POA: Diagnosis not present

## 2017-05-12 DIAGNOSIS — Z85828 Personal history of other malignant neoplasm of skin: Secondary | ICD-10-CM | POA: Insufficient documentation

## 2017-05-12 DIAGNOSIS — M1990 Unspecified osteoarthritis, unspecified site: Secondary | ICD-10-CM | POA: Diagnosis not present

## 2017-05-12 DIAGNOSIS — Z7982 Long term (current) use of aspirin: Secondary | ICD-10-CM | POA: Insufficient documentation

## 2017-05-12 DIAGNOSIS — Z87891 Personal history of nicotine dependence: Secondary | ICD-10-CM | POA: Insufficient documentation

## 2017-05-12 NOTE — Progress Notes (Signed)
Daily Session Note  Patient Details  Name: Jason Summers MRN: [id number] Date of Birth: 11-09-1931 Referring Provider:     Pulmonary Rehab Walk Test from 04/21/2017 in [place]  Referring Provider  Dr. Chase Caller      Encounter Date: 05/12/2017  Check In: Session Check In - 05/12/17 1030      Check-In   Location  [facility]    Staff Present  Rosebud Poles, RN, BSN;Corbitt Cloke, MS, ACSM RCEP, Exercise Physiologist;Lisa Ysidro Evert, RN;Portia Rollene Rotunda, RN, BSN    Supervising physician immediately available to respond to emergencies  [facility] immediately available    Physician(s)  Dr. Nevada Crane    Medication changes reported      No    Fall or balance concerns reported     No    Tobacco Cessation  No Change    Warm-up and Cool-down  Performed as group-led instruction    Resistance Training Performed  Yes    VAD Patient?  No      Pain Assessment   Currently in Pain?  No/denies    Multiple Pain Sites  No       Capillary Blood Glucose: No results found for this or any previous visit (from the past 24 hour(s)).  Exercise Prescription Changes - 05/12/17 1200      Response to Exercise   Blood Pressure (Admit)  122/70    Blood Pressure (Exercise)  152/72    Blood Pressure (Exit)  102/60    Heart Rate (Admit)  89 bpm    Heart Rate (Exercise)  100 bpm    Heart Rate (Exit)  91 bpm    Oxygen Saturation (Admit)  95 %    Oxygen Saturation (Exercise)  94 %    Oxygen Saturation (Exit)  95 %    Rating of Perceived Exertion (Exercise)  10    Perceived Dyspnea (Exercise)  2    Duration  Progress to 45 minutes of aerobic exercise without signs/symptoms of physical distress    Intensity  Other (comment) HRR 40-80%      Resistance Training   Training Prescription  Yes    Weight  blue bands    Reps  10-15      NuStep   Level  3    SPM  80    Minutes  17    METs  2.2      Rower   Level  1    Minutes  17      Track   Laps  16     Minutes  17       Social History   Tobacco Use  Smoking Status Former Smoker  . Packs/day: 0.20  . Years: 62.00  . Pack years: 12.40  . Types: Cigarettes  . Last attempt to quit: 03/10/2011  . Years since quitting: 6.1  Smokeless Tobacco Never Used  Tobacco Comment   smokes 1 ciagettes occasionally    Goals Met:  Exercise tolerated well No report of cardiac concerns or symptoms Strength training completed today  Goals Unmet:  Not Applicable  Comments: Service time is from 10:30a to 12:05p    Dr. Rush Farmer is Medical Director for Pulmonary Rehab at [facility].

## 2017-05-14 ENCOUNTER — Encounter (HOSPITAL_COMMUNITY)
Admission: RE | Admit: 2017-05-14 | Discharge: 2017-05-14 | Disposition: A | Discharge status: Home / Self Care | Payer: Medicare Other | Source: Ambulatory Visit | Attending: Internal Medicine | Admitting: Internal Medicine

## 2017-05-14 VITALS — Wt 173.3 lb

## 2017-05-14 DIAGNOSIS — J439 Emphysema, unspecified: Secondary | ICD-10-CM

## 2017-05-14 NOTE — Progress Notes (Signed)
Daily Session Note  Patient Details  Name: Jason Summers MRN: [id number] Date of Birth: 13-Aug-1931 Referring Provider:     Pulmonary Rehab Walk Test from 04/21/2017 in [place]  Referring Provider  Dr. Chase Caller      Encounter Date: 05/14/2017  Check In: Session Check In - 05/14/17 1030      Check-In   Location  [facility]    Staff Present  Rosebud Poles, RN, BSN;Enyla Lisbon, MS, ACSM RCEP, Exercise Physiologist;Lisa Ysidro Evert, RN;Portia Rollene Rotunda, RN, BSN    Supervising physician immediately available to respond to emergencies  [facility] immediately available    Physician(s)  Dr. Starla Link    Medication changes reported      No    Fall or balance concerns reported     No    Tobacco Cessation  No Change    Warm-up and Cool-down  Performed as group-led instruction    Resistance Training Performed  Yes    VAD Patient?  No      Pain Assessment   Currently in Pain?  No/denies    Multiple Pain Sites  No       Capillary Blood Glucose: No results found for this or any previous visit (from the past 24 hour(s)).  Exercise Prescription Changes - 05/14/17 1200      Response to Exercise   Blood Pressure (Admit)  140/84    Blood Pressure (Exercise)  138/70    Blood Pressure (Exit)  110/78    Heart Rate (Admit)  90 bpm    Heart Rate (Exercise)  107 bpm    Heart Rate (Exit)  88 bpm    Oxygen Saturation (Admit)  96 %    Oxygen Saturation (Exercise)  93 %    Oxygen Saturation (Exit)  97 %    Rating of Perceived Exertion (Exercise)  12    Perceived Dyspnea (Exercise)  2    Duration  Progress to 45 minutes of aerobic exercise without signs/symptoms of physical distress    Intensity  Other (comment) HRR 40-80%      Resistance Training   Training Prescription  Yes    Weight  blue bands    Reps  10-15      NuStep   Level  --    SPM  --    Minutes  --    METs  --      Rower   Level  1    Minutes  17      Track   Laps  18     Minutes  17       Social History   Tobacco Use  Smoking Status Former Smoker  . Packs/day: 0.20  . Years: 62.00  . Pack years: 12.40  . Types: Cigarettes  . Last attempt to quit: 03/10/2011  . Years since quitting: 6.1  Smokeless Tobacco Never Used  Tobacco Comment   smokes 1 ciagettes occasionally    Goals Met:  Exercise tolerated well No report of cardiac concerns or symptoms Strength training completed today  Goals Unmet:  Not Applicable  Comments: Service time is from 10:30a to 12:30p    Dr. Rush Farmer is Medical Director for Pulmonary Rehab at [facility].

## 2017-05-14 NOTE — Progress Notes (Signed)
Pulmonary Individual Treatment Plan  Patient Details  Name: Jason Summers MRN: [id number] Date of Birth: September 26, 1931 Referring Provider:     Pulmonary Rehab Walk Test from 04/21/2017 in [place]  Referring Provider  Dr. Chase Caller      Initial Encounter Date:    Pulmonary Rehab Walk Test from 04/21/2017 in [place]  Date  04/23/17  Referring Provider  Dr. Chase Caller      Visit Diagnosis: Pulmonary emphysema, unspecified emphysema type ([place])  Patient's Home Medications on Admission:   Current Outpatient Medications:  .  aspirin 81 MG EC tablet, Take 81 mg by mouth every other day. Reported on 10/16/2015, Disp: , Rfl:  .  Cholecalciferol (VITAMIN D3) 50000 units TABS, Take 50,000 Units once a week by mouth. Then take 50,000 monthly after 12 weeks., Disp: 51 tablet, Rfl: 0 .  cyproheptadine (PERIACTIN) 4 MG tablet, Take 1 tablet (4 mg total) by mouth at bedtime., Disp: 30 tablet, Rfl: 0 .  esomeprazole (NEXIUM) 40 MG capsule, Take 1 capsule (40 mg total) by mouth daily at 12 noon., Disp: 90 capsule, Rfl: 3 .  fish oil-omega-3 fatty acids 1000 MG capsule, Take 2 g by mouth daily. , Disp: , Rfl:  .  glucosamine-chondroitin 500-400 MG tablet, Take 1 tablet by mouth daily. Reported on 09/11/2015, Disp: , Rfl:   Past Medical History: Past Medical History:  Diagnosis Date  . Arthritis   . Cancer ([place])    basal cell on right temple  . Hemorrhoids   . Lateral epicondylitis (tennis elbow)   . Non-small cell lung cancer (NSCLC) ([place]) dx'f 07/30/15  . Odynophagia 11/06/2015  . Spondylosis of cervical joint     Tobacco Use: Social History   Tobacco Use  Smoking Status Former Smoker  . Packs/day: 0.20  . Years: 62.00  . Pack years: 12.40  . Types: Cigarettes  . Last attempt to quit: 03/10/2011  . Years since quitting: 6.1  Smokeless Tobacco Never Used  Tobacco Comment   smokes 1 ciagettes occasionally    Labs: Recent  Review Flowsheet Data    Labs for ITP Cardiac and Pulmonary Rehab Latest Ref Rng & Units 08/21/2011 10/04/2012 10/07/2013 10/20/2014 03/02/2017   Cholestrol 0 - 200 mg/dL 201(H) 180 180 192 177   LDLCALC 0 - 99 mg/dL - 115(H) 116(H) 110(H) 112(H)   LDLDIRECT mg/dL 118.5 - - - -   HDL >39.00 mg/dL 54.10 52.70 50.80 61.60 46.40   Trlycerides 0.0 - 149.0 mg/dL 91.0 63.0 67.0 104.0 90.0      Capillary Blood Glucose: Lab Results  Component Value Date   GLUCAP 97 12/30/2016   GLUCAP 103 (H) 09/05/2015     Pulmonary Assessment Scores: Pulmonary Assessment Scores    Row Name 04/23/17 0721 04/23/17 1426       ADL UCSD   ADL Phase  Entry  Entry    SOB Score total  -  22      CAT Score   CAT Score  -  13 Entry      mMRC Score   mMRC Score  1  -       Pulmonary Function Assessment: Pulmonary Function Assessment - 04/20/17 1021      Breath   Bilateral Breath Sounds  Clear    Shortness of Breath  Yes;Limiting activity       Exercise Target Goals:    Exercise Program Goal: Individual exercise prescription set with THRR, safety & activity  barriers. Participant demonstrates ability to understand and report RPE using BORG scale, to self-measure pulse accurately, and to acknowledge the importance of the exercise prescription.  Exercise Prescription Goal: Starting with aerobic activity 30 plus minutes a day, 3 days per week for initial exercise prescription. Provide home exercise prescription and guidelines that participant acknowledges understanding prior to discharge.  Activity Barriers & Risk Stratification: Activity Barriers & Cardiac Risk Stratification - 04/20/17 1024      Activity Barriers & Cardiac Risk Stratification   Activity Barriers  None       6 Minute Walk: 6 Minute Walk    Row Name 04/23/17 0716         6 Minute Walk   Phase  Initial     Distance  1342 feet     Walk Time  6 minutes     # of Rest Breaks  0     MPH  2.54     METS  2.91     RPE  12      Perceived Dyspnea   2     Symptoms  No     Resting HR  89 bpm     Resting BP  152/81     Resting Oxygen Saturation   94 %     Exercise Oxygen Saturation  during 6 min walk  90 %     Max Ex. HR  111 bpm     Max Ex. BP  178/82     2 Minute Post BP  148/88       Interval HR   1 Minute HR  99     2 Minute HR  105     3 Minute HR  108     4 Minute HR  111     5 Minute HR  112     6 Minute HR  113     2 Minute Post HR  106     Interval Heart Rate?  Yes       Interval Oxygen   Interval Oxygen?  Yes     Baseline Oxygen Saturation %  94 %     1 Minute Oxygen Saturation %  95 %     1 Minute Liters of Oxygen  0 L     2 Minute Oxygen Saturation %  95 %     2 Minute Liters of Oxygen  0 L     3 Minute Oxygen Saturation %  92 %     3 Minute Liters of Oxygen  0 L     4 Minute Oxygen Saturation %  91 %     4 Minute Liters of Oxygen  0 L     5 Minute Oxygen Saturation %  90 %     5 Minute Liters of Oxygen  0 L     6 Minute Oxygen Saturation %  90 %     6 Minute Liters of Oxygen  0 L     2 Minute Post Oxygen Saturation %  92 %     2 Minute Post Liters of Oxygen  0 L        Oxygen Initial Assessment: Oxygen Initial Assessment - 04/23/17 0716      Initial 6 min Walk   Oxygen Used  None      Program Oxygen Prescription   Program Oxygen Prescription  None       Oxygen Re-Evaluation:   Oxygen Discharge (Final Oxygen Re-Evaluation):  Initial Exercise Prescription: Initial Exercise Prescription - 04/23/17 0700      Date of Initial Exercise RX and Referring Provider   Date  04/23/17    Referring Provider  Dr. Chase Caller      Treadmill   MPH  1.7    Grade  0    Minutes  17      NuStep   Level  2    SPM  80    Minutes  17    METs  1.5      Rower   Level  1    Watts  20    Minutes  17      Prescription Details   Frequency (times per week)  2    Duration  Progress to 45 minutes of aerobic exercise without signs/symptoms of physical distress      Intensity   THRR  40-80% of Max Heartrate  55-110    Ratings of Perceived Exertion  11-13    Perceived Dyspnea  0-4      Progression   Progression  Continue progressive overload as per policy without signs/symptoms or physical distress.      Resistance Training   Training Prescription  Yes    Weight  blue bands    Reps  10-15       Perform Capillary Blood Glucose checks as needed.  Exercise Prescription Changes: Exercise Prescription Changes    Row Name 04/28/17 1216 05/12/17 1200           Response to Exercise   Blood Pressure (Admit)  100/60  122/70      Blood Pressure (Exercise)  130/74  152/72      Blood Pressure (Exit)  110/65  102/60      Heart Rate (Admit)  86 bpm  89 bpm      Heart Rate (Exercise)  113 bpm  100 bpm      Heart Rate (Exit)  93 bpm  91 bpm      Oxygen Saturation (Admit)  95 %  95 %      Oxygen Saturation (Exercise)  91 %  94 %      Oxygen Saturation (Exit)  95 %  95 %      Rating of Perceived Exertion (Exercise)  13  10      Perceived Dyspnea (Exercise)  2  2      Duration  Progress to 45 minutes of aerobic exercise without signs/symptoms of physical distress  Progress to 45 minutes of aerobic exercise without signs/symptoms of physical distress      Intensity  Other (comment) HRR 40-80%  Other (comment) HRR 40-80%        Resistance Training   Training Prescription  Yes  Yes      Weight  blue bands  blue bands      Reps  10-15  10-15        Treadmill   MPH  1.7  -      Grade  0  -      Minutes  17  -        NuStep   Level  2  3      SPM  80  80      Minutes  17  17      METs  2.2  2.2        Rower   Level  1  1      Watts  20  -  Minutes  17  17        Track   Laps  -  16      Minutes  -  17         Exercise Comments:   Exercise Goals and Review:   Exercise Goals Re-Evaluation : Exercise Goals Re-Evaluation    Row Name 05/11/17 1628             Exercise Goal Re-Evaluation   Exercise Goals Review  Increase Strength and  Stamina;Increase Physical Activity;Able to understand and use Dyspnea scale;Able to understand and use rate of perceived exertion (RPE) scale;Knowledge and understanding of Target Heart Rate Range (THRR);Understanding of Exercise Prescription       Comments  Patient has only attended one exercise session. Has been out due to illness. Will cont. to progress as able.        Expected Outcomes  Through exercise at rehab and at home, patient will increase strength and stamina making ADL's easier to perform. Patient will also have a better understanding of safe exercise and what they are capable to do outside of clinical supervision.          Discharge Exercise Prescription (Final Exercise Prescription Changes): Exercise Prescription Changes - 05/12/17 1200      Response to Exercise   Blood Pressure (Admit)  122/70    Blood Pressure (Exercise)  152/72    Blood Pressure (Exit)  102/60    Heart Rate (Admit)  89 bpm    Heart Rate (Exercise)  100 bpm    Heart Rate (Exit)  91 bpm    Oxygen Saturation (Admit)  95 %    Oxygen Saturation (Exercise)  94 %    Oxygen Saturation (Exit)  95 %    Rating of Perceived Exertion (Exercise)  10    Perceived Dyspnea (Exercise)  2    Duration  Progress to 45 minutes of aerobic exercise without signs/symptoms of physical distress    Intensity  Other (comment) HRR 40-80%      Resistance Training   Training Prescription  Yes    Weight  blue bands    Reps  10-15      NuStep   Level  3    SPM  80    Minutes  17    METs  2.2      Rower   Level  1    Minutes  17      Track   Laps  16    Minutes  17       Nutrition:  Target Goals: Understanding of nutrition guidelines, daily intake of sodium <1562m, cholesterol <2082m calories 30% from fat and 7% or less from saturated fats, daily to have 5 or more servings of fruits and vegetables.  Biometrics: Pre Biometrics - 04/20/17 1024      Pre Biometrics   Grip Strength  28 kg        Nutrition Therapy  Plan and Nutrition Goals: Nutrition Therapy & Goals - 05/08/17 0934      Nutrition Therapy   Diet  General, healthful      Personal Nutrition Goals   Nutrition Goal  Identify food quantities necessary to achieve desired wt loss at graduation from pulmonary rehab.      Intervention Plan   Intervention  Prescribe, educate and counsel regarding individualized specific dietary modifications aiming towards targeted core components such as weight, hypertension, lipid management, diabetes, heart failure and other comorbidities.    Expected Outcomes  Short Term Goal: Understand basic principles of dietary content, such as calories, fat, sodium, cholesterol and nutrients.;Long Term Goal: Adherence to prescribed nutrition plan.       Nutrition Discharge: Rate Your Plate Scores: Nutrition Assessments - 05/08/17 0930      Rate Your Plate Scores   Pre Score  58       Nutrition Goals Re-Evaluation:   Nutrition Goals Discharge (Final Nutrition Goals Re-Evaluation):   Psychosocial: Target Goals: Acknowledge presence or absence of significant depression and/or stress, maximize coping skills, provide positive support system. Participant is able to verbalize types and ability to use techniques and skills needed for reducing stress and depression.  Initial Review & Psychosocial Screening: Initial Psych Review & Screening - 04/20/17 1028      Initial Review   Current issues with  None Identified      Family Dynamics   Good Support System?  Yes      Barriers   Psychosocial barriers to participate in program  There are no identifiable barriers or psychosocial needs.       Quality of Life Scores:   PHQ-9: Recent Review Flowsheet Data    Depression screen [facility] 2/9 04/20/2017 10/30/2015 10/20/2014 10/14/2013 10/14/2013   Decreased Interest - 0 0 0 0   Down, Depressed, Hopeless 0 0 0 0 0   PHQ - 2 Score 0 0 0 0 0     Interpretation of Total Score  Total Score Depression Severity:  1-4 =  Minimal depression, 5-9 = Mild depression, 10-14 = Moderate depression, 15-19 = Moderately severe depression, 20-27 = Severe depression   Psychosocial Evaluation and Intervention: Psychosocial Evaluation - 04/20/17 1029      Psychosocial Evaluation & Interventions   Interventions  Encouraged to exercise with the program and follow exercise prescription    Continue Psychosocial Services   No Follow up required       Psychosocial Re-Evaluation: Psychosocial Re-Evaluation    [place] Name 05/12/17 1417             Psychosocial Re-Evaluation   Current issues with  None Identified       Expected Outcomes  patient will remain free from psychosocial barriers to participation in pulmonary rehab       Interventions  Encouraged to attend Pulmonary Rehabilitation for the exercise       Continue Psychosocial Services   No Follow up required          Psychosocial Discharge (Final Psychosocial Re-Evaluation): Psychosocial Re-Evaluation - 05/12/17 1417      Psychosocial Re-Evaluation   Current issues with  None Identified    Expected Outcomes  patient will remain free from psychosocial barriers to participation in pulmonary rehab    Interventions  Encouraged to attend Pulmonary Rehabilitation for the exercise    Continue Psychosocial Services   No Follow up required       Education: Education Goals: Education classes will be provided on a weekly basis, covering required topics. Participant will state understanding/return demonstration of topics presented.  Learning Barriers/Preferences: Learning Barriers/Preferences - 04/20/17 1020      Learning Barriers/Preferences   Learning Barriers  None    Learning Preferences  Individual Instruction;Skilled Demonstration       Education Topics: Risk Factor Reduction:  -Group instruction that is supported by a PowerPoint presentation. Instructor discusses the definition of a risk factor, different risk factors for pulmonary disease, and how the  heart and lungs work together.     Nutrition for Pulmonary Patient:  -  Group instruction provided by PowerPoint slides, verbal discussion, and written materials to support subject matter. The instructor gives an explanation and review of healthy diet recommendations, which includes a discussion on weight management, recommendations for fruit and vegetable consumption, as well as protein, fluid, caffeine, fiber, sodium, sugar, and alcohol. Tips for eating when patients are short of breath are discussed.   Pursed Lip Breathing:  -Group instruction that is supported by demonstration and informational handouts. Instructor discusses the benefits of pursed lip and diaphragmatic breathing and detailed demonstration on how to preform both.     Oxygen Safety:  -Group instruction provided by PowerPoint, verbal discussion, and written material to support subject matter. There is an overview of "What is Oxygen" and "Why do we need it".  Instructor also reviews how to create a safe environment for oxygen use, the importance of using oxygen as prescribed, and the risks of noncompliance. There is a brief discussion on traveling with oxygen and resources the patient may utilize.   Oxygen Equipment:  -Group instruction provided by [facility] Staff utilizing handouts, written materials, and equipment demonstrations.   Signs and Symptoms:  -Group instruction provided by written material and verbal discussion to support subject matter. Warning signs and symptoms of infection, stroke, and heart attack are reviewed and when to call the physician/911 reinforced. Tips for preventing the spread of infection discussed.   Advanced Directives:  -Group instruction provided by verbal instruction and written material to support subject matter. Instructor reviews Advanced Directive laws and proper instruction for filling out document.   Pulmonary Video:  -Group video education that reviews the importance of medication  and oxygen compliance, exercise, good nutrition, pulmonary hygiene, and pursed lip and diaphragmatic breathing for the pulmonary patient.   Exercise for the Pulmonary Patient:  -Group instruction that is supported by a PowerPoint presentation. Instructor discusses benefits of exercise, core components of exercise, frequency, duration, and intensity of an exercise routine, importance of utilizing pulse oximetry during exercise, safety while exercising, and options of places to exercise outside of rehab.     Pulmonary Medications:  -Verbally interactive group education provided by instructor with focus on inhaled medications and proper administration.   Anatomy and Physiology of the Respiratory System and Intimacy:  -Group instruction provided by PowerPoint, verbal discussion, and written material to support subject matter. Instructor reviews respiratory cycle and anatomical components of the respiratory system and their functions. Instructor also reviews differences in obstructive and restrictive respiratory diseases with examples of each. Intimacy, Sex, and Sexuality differences are reviewed with a discussion on how relationships can change when diagnosed with pulmonary disease. Common sexual concerns are reviewed.   MD DAY -A group question and answer session with a medical doctor that allows participants to ask questions that relate to their pulmonary disease state.   OTHER EDUCATION -Group or individual verbal, written, or video instructions that support the educational goals of the pulmonary rehab program.   Knowledge Questionnaire Score: Knowledge Questionnaire Score - 04/23/17 1425      Knowledge Questionnaire Score   Pre Score  10/13       Core Components/Risk Factors/Patient Goals at Admission: Personal Goals and Risk Factors at Admission - 04/20/17 1027      Core Components/Risk Factors/Patient Goals on Admission   Improve shortness of breath with ADL's  Yes     Intervention  Provide education, individualized exercise plan and daily activity instruction to help decrease symptoms of SOB with activities of daily living.    Expected Outcomes  Short Term: Achieves a reduction of symptoms when performing activities of daily living.    Develop more efficient breathing techniques such as purse lipped breathing and diaphragmatic breathing; and practicing self-pacing with activity  Yes    Intervention  Provide education, demonstration and support about specific breathing techniuqes utilized for more efficient breathing. Include techniques such as pursed lipped breathing, diaphragmatic breathing and self-pacing activity.    Expected Outcomes  Short Term: Participant will be able to demonstrate and use breathing techniques as needed throughout daily activities.    Increase knowledge of respiratory medications and ability to use respiratory devices properly   Yes    Intervention  Provide education and demonstration as needed of appropriate use of medications, inhalers, and oxygen therapy.    Expected Outcomes  Short Term: Achieves understanding of medications use. Understands that oxygen is a medication prescribed by physician. Demonstrates appropriate use of inhaler and oxygen therapy.       Core Components/Risk Factors/Patient Goals Review:  Goals and Risk Factor Review    Row Name 05/12/17 1415             Core Components/Risk Factors/Patient Goals Review   Personal Goals Review  Improve shortness of breath with ADL's;Increase knowledge of respiratory medications and ability to use respiratory devices properly.;Develop more efficient breathing techniques such as purse lipped breathing and diaphragmatic breathing and practicing self-pacing with activity.       Review  patient is doing well in pulmonary rehab. He seems to enjoy his sessions and engages in conversation with the staff and classmates when here. He missed both exercise sessions last week related to a  head cold. He has only attended 2 exercise sessions since admission and it is too soon to evaluate progress towards goals.       Expected Outcomes  see admission outcomes          Core Components/Risk Factors/Patient Goals at Discharge (Final Review):  Goals and Risk Factor Review - 05/12/17 1415      Core Components/Risk Factors/Patient Goals Review   Personal Goals Review  Improve shortness of breath with ADL's;Increase knowledge of respiratory medications and ability to use respiratory devices properly.;Develop more efficient breathing techniques such as purse lipped breathing and diaphragmatic breathing and practicing self-pacing with activity.    Review  patient is doing well in pulmonary rehab. He seems to enjoy his sessions and engages in conversation with the staff and classmates when here. He missed both exercise sessions last week related to a head cold. He has only attended 2 exercise sessions since admission and it is too soon to evaluate progress towards goals.    Expected Outcomes  see admission outcomes       ITP Comments:   Comments: patient has attended 2 sessions since admission

## 2017-05-19 ENCOUNTER — Encounter (HOSPITAL_COMMUNITY)
Admission: RE | Admit: 2017-05-19 | Discharge: 2017-05-19 | Disposition: A | Discharge status: Home / Self Care | Payer: Medicare Other | Source: Ambulatory Visit | Attending: Internal Medicine | Admitting: Internal Medicine

## 2017-05-19 VITALS — Wt 172.8 lb

## 2017-05-19 DIAGNOSIS — J439 Emphysema, unspecified: Secondary | ICD-10-CM

## 2017-05-19 NOTE — Progress Notes (Signed)
Daily Session Note  Patient Details  Name: Jason Summers MRN: [id number] Date of Birth: 1931-09-28 Referring Provider:     Pulmonary Rehab Walk Test from 04/21/2017 in [place]  Referring Provider  Dr. Chase Caller      Encounter Date: 05/19/2017  Check In: Session Check In - 05/19/17 1029      Check-In   Location  [facility]    Staff Present  Su Hilt, MS, ACSM RCEP, Exercise Physiologist;Portia Rollene Rotunda, RN, Maxcine Ham, RN, BSN    Supervising physician immediately available to respond to emergencies  [facility] immediately available    Physician(s)  Dr. Starla Link    Medication changes reported      No    Fall or balance concerns reported     No    Tobacco Cessation  No Change    Warm-up and Cool-down  Performed as group-led instruction    Resistance Training Performed  Yes    VAD Patient?  No      Pain Assessment   Currently in Pain?  No/denies    Multiple Pain Sites  No       Capillary Blood Glucose: No results found for this or any previous visit (from the past 24 hour(s)).  Exercise Prescription Changes - 05/19/17 1200      Response to Exercise   Blood Pressure (Admit)  108/72    Blood Pressure (Exercise)  120/74    Blood Pressure (Exit)  110/64    Heart Rate (Admit)  95 bpm    Heart Rate (Exercise)  93 bpm    Heart Rate (Exit)  95 bpm    Oxygen Saturation (Admit)  95 %    Oxygen Saturation (Exercise)  93 %    Oxygen Saturation (Exit)  95 %    Rating of Perceived Exertion (Exercise)  12    Perceived Dyspnea (Exercise)  2    Duration  Progress to 45 minutes of aerobic exercise without signs/symptoms of physical distress    Intensity  Other (comment) HRR 40-80%      Resistance Training   Training Prescription  Yes    Weight  blue bands    Reps  10-15      NuStep   Level  4    SPM  80    Minutes  17    METs  2.1      Rower   Level  1    Minutes  17      Track   Laps  19    Minutes  17        Social History   Tobacco Use  Smoking Status Former Smoker  . Packs/day: 0.20  . Years: 62.00  . Pack years: 12.40  . Types: Cigarettes  . Last attempt to quit: 03/10/2011  . Years since quitting: 6.1  Smokeless Tobacco Never Used  Tobacco Comment   smokes 1 ciagettes occasionally    Goals Met:  Exercise tolerated well No report of cardiac concerns or symptoms Strength training completed today  Goals Unmet:  Not Applicable  Comments: Service time is from 10:30a to 12:00p    Dr. Rush Farmer is Medical Director for Pulmonary Rehab at [facility].

## 2017-05-21 ENCOUNTER — Encounter (HOSPITAL_COMMUNITY): Payer: Medicare Other

## 2017-05-25 IMAGING — CT CT ANGIO CHEST
2 of 6 series · 18 of 36 positions shown · IV contrast (isovue)
Comparison: CT of the chest 12/30/2012

CLINICAL DATA: History of lung cancer with shortness of breath for
3 weeks. Recent completion of chemotherapy. Clinical concern for
pulmonary embolus.

EXAM:
CT ANGIOGRAPHY CHEST WITH CONTRAST
TECHNIQUE: Multidetector CT imaging of the chest was performed using the
standard protocol during bolus administration of intravenous
contrast. Multiplanar CT image reconstructions and MIPs were
obtained to evaluate the vascular anatomy.
CONTRAST:  100 cc Isovue 370 intravenously.

[Series 6: pe thins @ 1mm · axial · 0.89mm/px · z∈[+48,+330]mm · 17 of 313 slices shown]
[im 16/313  lung]
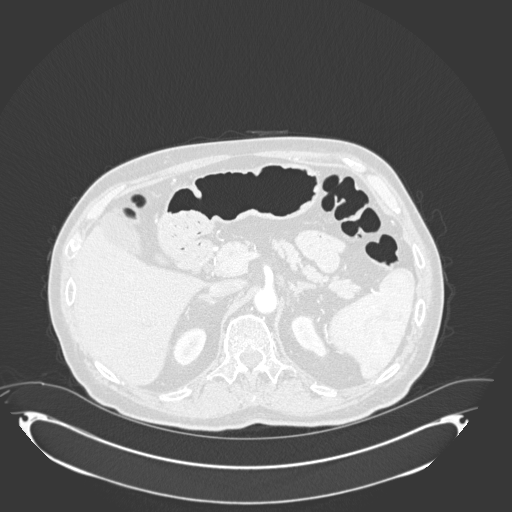
[im 32/313  mediastinal]
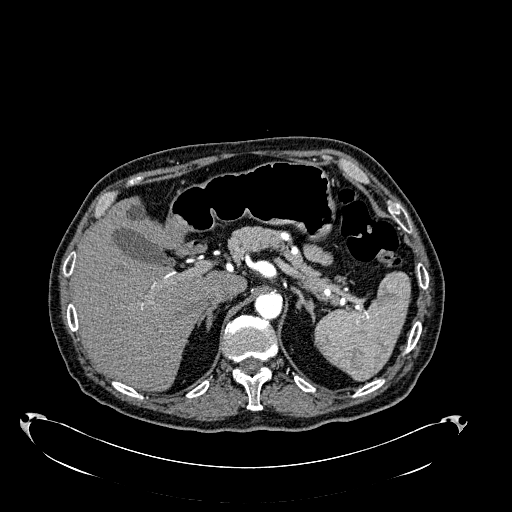
[im 47/313  lung]
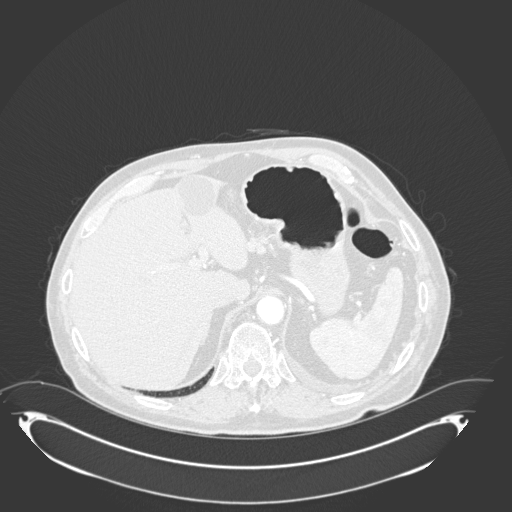
[im 63/313  mediastinal]
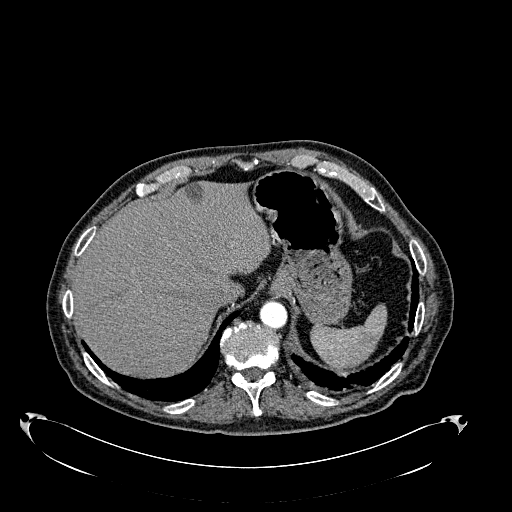
[im 94/313  lung]
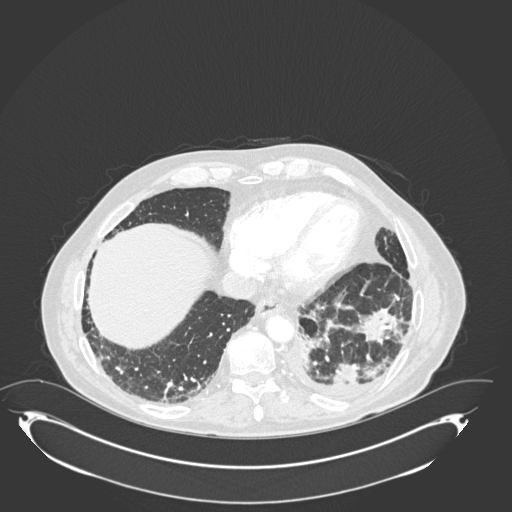
[im 110/313  mediastinal]
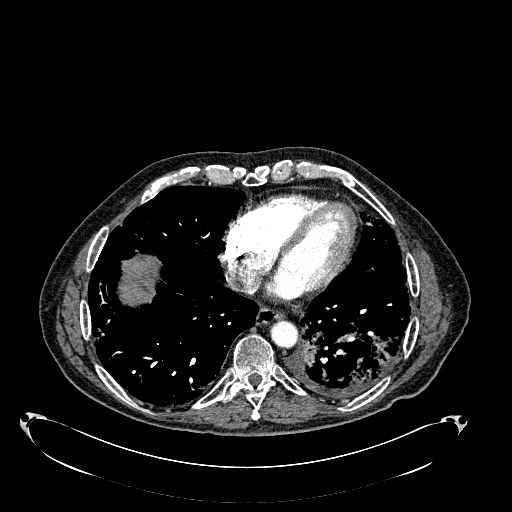
[im 125/313  lung]
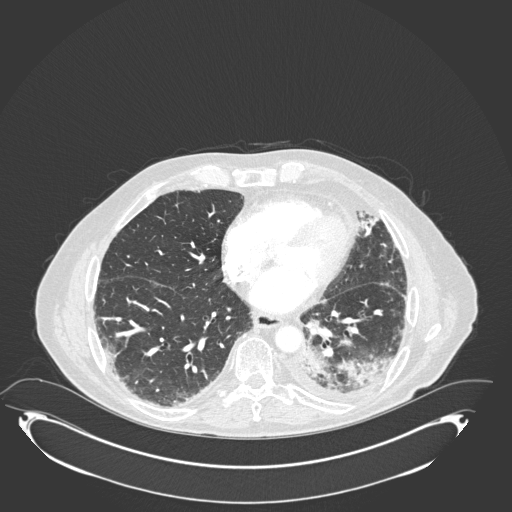
[im 141/313  mediastinal]
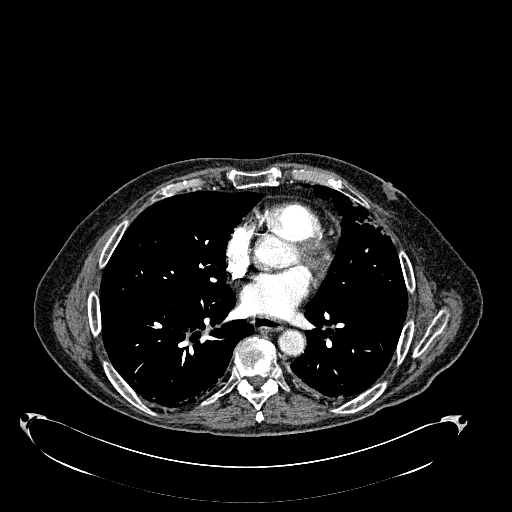
[im 157/313  lung]
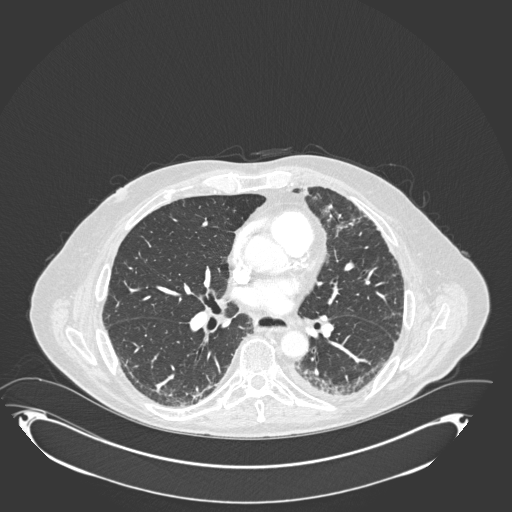
[im 172/313  mediastinal]
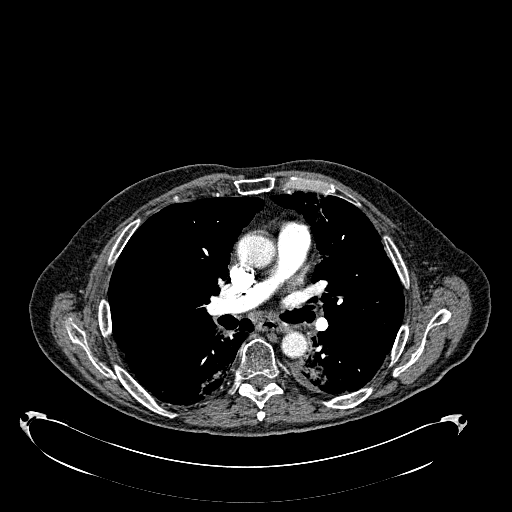
[im 188/313  lung]
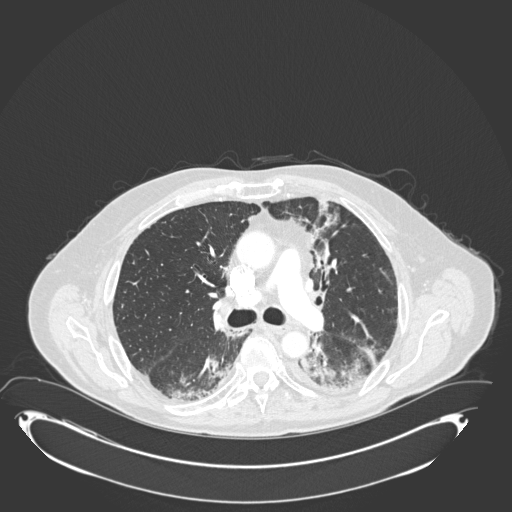
[im 203/313  mediastinal]
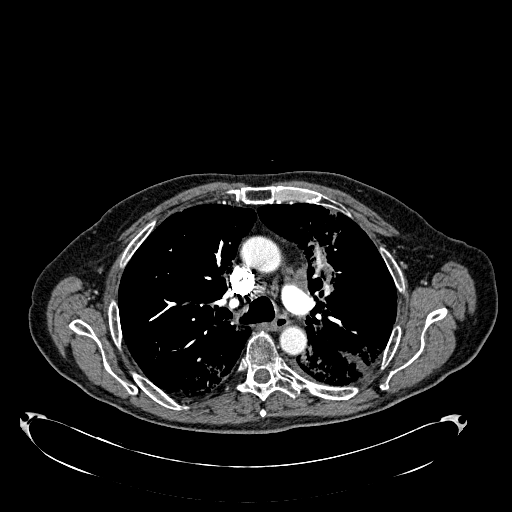
[im 219/313  lung]
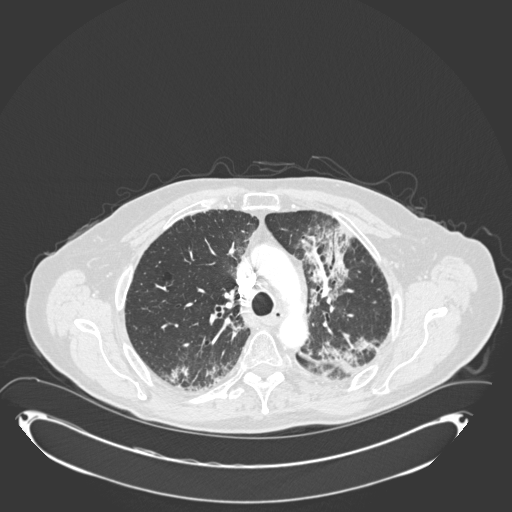
[im 250/313  mediastinal]
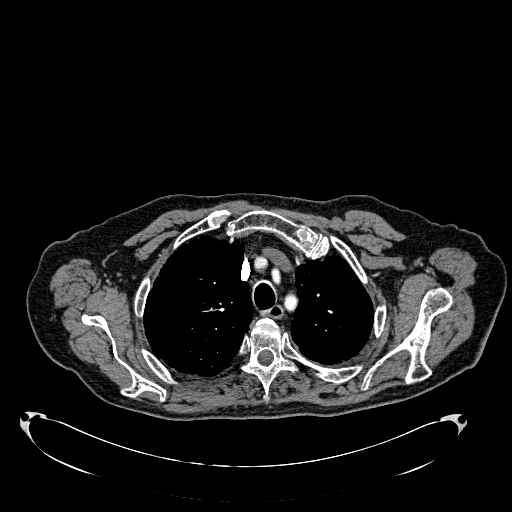
[im 266/313  lung]
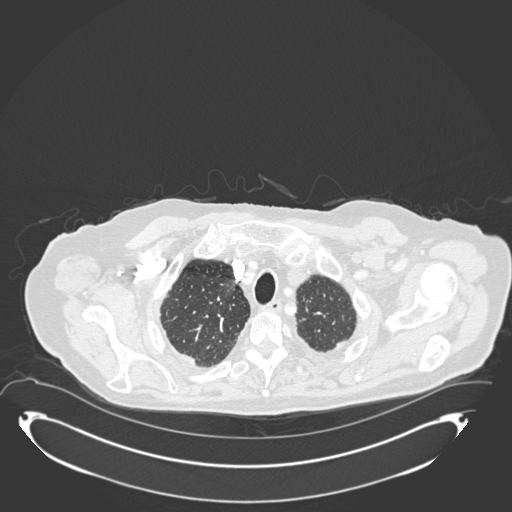
[im 281/313  mediastinal]
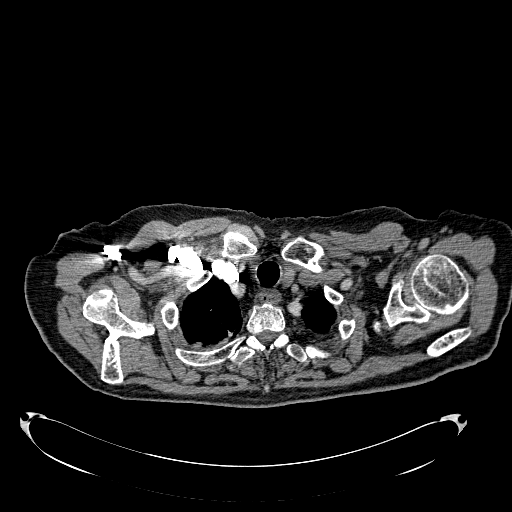
[im 297/313  lung]
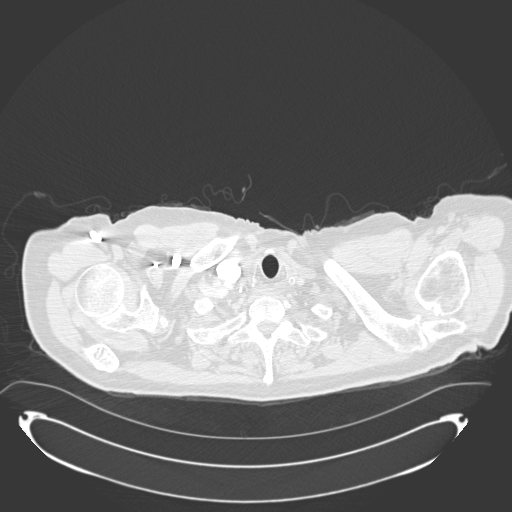

[Series 602: <mpr thick range> · coronal · 0.89mm/px · 1 of 131 slices shown]
[im 66/131  mediastinal]
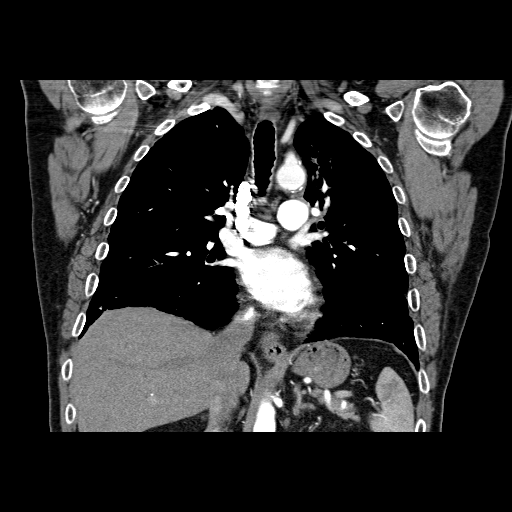

[18 of 36 positions shown; findings below may reference images not displayed]

FINDINGS: Mediastinum/Lymph Nodes: No pulmonary emboli or thoracic aortic
dissection identified. There is stable mediastinal lymphadenopathy
with the index left prevascular lymph node measuring 11 mm, which
demonstrates necrotic appearance. The heart is normal in size. There
is no pericardial effusion. Atherosclerotic disease of the aorta
noted.

Lungs/Pleura: There is a small left pleural effusion. The dominant
left lower lobe subpleural pulmonary mass now measures 1.9 x 1.1 cm.
There is a stable 3 mm nodule in the lateral aspect of the left
upper lobe the previously demonstrated lingular nodule is difficult
to measure as it is imbedded in an area of atelectatic changes.
There has been interval development of patchy areas of predominantly
peribronchovascular airspace consolidation in left upper lobe,
lingula, basilar segments of left lower lobe. Similar findings are
seen in the dependent portions of the right lung.

Upper abdomen: No acute findings.  Multiple liver cysts are noted.

Musculoskeletal: No chest wall mass or suspicious bone lesions
identified.

Review of the MIP images confirms the above findings.
IMPRESSION: No evidence of pulmonary embolus.

Slight decrease in the dominant left lower lobe subpleural pulmonary
mass.

Interval development of small left pleural effusion and patchy
predominantly peribronchovascular areas of airspace consolidation,
left greater than right. These may represent areas of acute
infectious consolidation such as in multifocal pneumonia. However,
lymphangitic spread of disease is also a consideration.

Stable mediastinal lymphadenopathy.

## 2017-05-26 ENCOUNTER — Encounter (HOSPITAL_COMMUNITY): Payer: Medicare Other

## 2017-05-28 ENCOUNTER — Ambulatory Visit: Payer: Medicare Other | Admitting: Internal Medicine

## 2017-05-28 ENCOUNTER — Encounter (HOSPITAL_COMMUNITY)
Admission: RE | Admit: 2017-05-28 | Discharge: 2017-05-28 | Disposition: A | Discharge status: Home / Self Care | Payer: Medicare Other | Source: Ambulatory Visit | Attending: Internal Medicine | Admitting: Internal Medicine

## 2017-05-28 VITALS — Wt 176.1 lb

## 2017-05-28 DIAGNOSIS — J439 Emphysema, unspecified: Secondary | ICD-10-CM

## 2017-05-28 NOTE — Progress Notes (Signed)
Daily Session Note  Patient Details  Name: Jason Summers MRN: [id number] Date of Birth: [birth year] Referring Provider:     Pulmonary Rehab Walk Test from 04/21/2017 in [facility]  Referring Provider  Dr. Chase Caller      Encounter Date: 05/28/2017  Check In: Session Check In - 05/28/17 1110      Check-In   Location  [facility]    Staff Present  Su Hilt, MS, ACSM RCEP, Exercise Physiologist;Burl Tauzin Rollene Rotunda, RN, Roque Cash, RN    Supervising physician immediately available to respond to emergencies  [facility] immediately available    Physician(s)  Dr. Wendee Beavers    Medication changes reported      No    Fall or balance concerns reported     No    Tobacco Cessation  No Change    Warm-up and Cool-down  Performed as group-led instruction    Resistance Training Performed  Yes    VAD Patient?  No      Pain Assessment   Currently in Pain?  No/denies    Multiple Pain Sites  No       Capillary Blood Glucose: No results found for this or any previous visit (from the past 24 hour(s)).  Exercise Prescription Changes - 05/28/17 1313      Response to Exercise   Blood Pressure (Admit)  116/70    Blood Pressure (Exercise)  140/68    Blood Pressure (Exit)  128/80    Heart Rate (Admit)  86 bpm    Heart Rate (Exercise)  102 bpm    Heart Rate (Exit)  96 bpm    Oxygen Saturation (Admit)  96 %    Oxygen Saturation (Exercise)  93 %    Oxygen Saturation (Exit)  96 %    Rating of Perceived Exertion (Exercise)  11    Perceived Dyspnea (Exercise)  1    Duration  Progress to 45 minutes of aerobic exercise without signs/symptoms of physical distress    Intensity  Other (comment) HRR 40-80%      Resistance Training   Training Prescription  Yes    Weight  blue bands    Reps  10-15    Time  10 Minutes      NuStep   Level  4    SPM  80    Minutes  17    METs  1.3      Rower   Level  --    Minutes  --      Track   Laps  14     Minutes  17       Social History   Tobacco Use  Smoking Status Former Smoker  . Packs/day: 0.20  . Years: 62.00  . Pack years: 12.40  . Types: Cigarettes  . Last attempt to quit: 03/10/2011  . Years since quitting: 6.2  Smokeless Tobacco Never Used  Tobacco Comment   smokes 1 ciagettes occasionally    Goals Met:  Independence with exercise equipment Improved SOB with ADL's Using PLB without cueing & demonstrates good technique Exercise tolerated well No report of cardiac concerns or symptoms Strength training completed today  Goals Unmet:  Not Applicable  Comments: Service time is from 1030 to Powers   Dr. Rush Farmer is Medical Director for Pulmonary Rehab at [facility].

## 2017-05-29 ENCOUNTER — Ambulatory Visit: Payer: Medicare Other | Admitting: Internal Medicine

## 2017-06-04 ENCOUNTER — Encounter (HOSPITAL_COMMUNITY)
Admission: RE | Admit: 2017-06-04 | Discharge: 2017-06-04 | Disposition: A | Discharge status: Home / Self Care | Payer: Medicare Other | Source: Ambulatory Visit | Attending: Internal Medicine | Admitting: Internal Medicine

## 2017-06-04 VITALS — Wt 174.2 lb

## 2017-06-04 DIAGNOSIS — J439 Emphysema, unspecified: Secondary | ICD-10-CM

## 2017-06-04 NOTE — Progress Notes (Signed)
Daily Session Note  Patient Details  Name: Jason Summers MRN: [id number] Date of Birth: [birth year] Referring Provider:     Pulmonary Rehab Walk Test from 04/21/2017 in [place]  Referring Provider  Dr. Chase Caller      Encounter Date: 06/04/2017  Check In: Session Check In - 06/04/17 1020      Check-In   Location  [facility]    Staff Present  Rosebud Poles, RN, BSN;Anzleigh Slaven Ysidro Evert, RN;Portia Rollene Rotunda, RN, BSN    Supervising physician immediately available to respond to emergencies  [facility] immediately available    Physician(s)  Dr. Rockne Menghini    Medication changes reported      No    Fall or balance concerns reported     No    Tobacco Cessation  No Change    Warm-up and Cool-down  Performed as group-led instruction    Resistance Training Performed  Yes    VAD Patient?  No      Pain Assessment   Currently in Pain?  No/denies    Multiple Pain Sites  No       Capillary Blood Glucose: No results found for this or any previous visit (from the past 24 hour(s)).  Exercise Prescription Changes - 06/04/17 1200      Response to Exercise   Blood Pressure (Admit)  110/62    Blood Pressure (Exercise)  130/70    Blood Pressure (Exit)  108/70    Heart Rate (Admit)  67 bpm    Heart Rate (Exercise)  107 bpm    Heart Rate (Exit)  91 bpm    Oxygen Saturation (Admit)  97 %    Oxygen Saturation (Exercise)  94 %    Oxygen Saturation (Exit)  96 %    Rating of Perceived Exertion (Exercise)  9    Perceived Dyspnea (Exercise)  3    Duration  Progress to 45 minutes of aerobic exercise without signs/symptoms of physical distress    Intensity  THRR unchanged      Progression   Progression  Continue to progress workloads to maintain intensity without signs/symptoms of physical distress.      Resistance Training   Training Prescription  Yes    Weight  blue bands    Reps  10-15    Time  10 Minutes      Bike   Level  1    Minutes  17      NuStep   Level  4    Minutes  17    METs  2.7      Track   Laps  17    Minutes  17       Social History   Tobacco Use  Smoking Status Former Smoker  . Packs/day: 0.20  . Years: 62.00  . Pack years: 12.40  . Types: Cigarettes  . Last attempt to quit: 03/10/2011  . Years since quitting: 6.2  Smokeless Tobacco Never Used  Tobacco Comment   smokes 1 ciagettes occasionally    Goals Met:  Exercise tolerated well No report of cardiac concerns or symptoms Strength training completed today  Goals Unmet:  Not Applicable  Comments: Service time is from 1030 to 1215    Dr. Rush Farmer is Medical Director for Pulmonary Rehab at [facility].

## 2017-06-04 NOTE — Progress Notes (Signed)
Pulmonary Individual Treatment Plan  Patient Details  Name: Jason Summers MRN: [id number] Date of Birth: 24-Mar-1932 Referring Provider:     Pulmonary Rehab Walk Test from 04/21/2017 in [place]  Referring Provider  Dr. Chase Caller      Initial Encounter Date:    Pulmonary Rehab Walk Test from 04/21/2017 in [place]  Date  04/23/17  Referring Provider  Dr. Chase Caller      Visit Diagnosis: Pulmonary emphysema, unspecified emphysema type ([place])  Patient's Home Medications on Admission:   Current Outpatient Medications:  .  aspirin 81 MG EC tablet, Take 81 mg by mouth every other day. Reported on 10/16/2015, Disp: , Rfl:  .  Cholecalciferol (VITAMIN D3) 50000 units TABS, Take 50,000 Units once a week by mouth. Then take 50,000 monthly after 12 weeks., Disp: 51 tablet, Rfl: 0 .  cyproheptadine (PERIACTIN) 4 MG tablet, Take 1 tablet (4 mg total) by mouth at bedtime., Disp: 30 tablet, Rfl: 0 .  esomeprazole (NEXIUM) 40 MG capsule, Take 1 capsule (40 mg total) by mouth daily at 12 noon., Disp: 90 capsule, Rfl: 3 .  fish oil-omega-3 fatty acids 1000 MG capsule, Take 2 g by mouth daily. , Disp: , Rfl:  .  glucosamine-chondroitin 500-400 MG tablet, Take 1 tablet by mouth daily. Reported on 09/11/2015, Disp: , Rfl:   Past Medical History: Past Medical History:  Diagnosis Date  . Arthritis   . Cancer ([place])    basal cell on right temple  . Hemorrhoids   . Lateral epicondylitis (tennis elbow)   . Non-small cell lung cancer (NSCLC) ([place]) dx'f 07/30/15  . Odynophagia 11/06/2015  . Spondylosis of cervical joint     Tobacco Use: Social History   Tobacco Use  Smoking Status Former Smoker  . Packs/day: 0.20  . Years: 62.00  . Pack years: 12.40  . Types: Cigarettes  . Last attempt to quit: 03/10/2011  . Years since quitting: 6.2  Smokeless Tobacco Never Used  Tobacco Comment   smokes 1 ciagettes occasionally    Labs: Recent  Review Flowsheet Data    Labs for ITP Cardiac and Pulmonary Rehab Latest Ref Rng & Units 08/21/2011 10/04/2012 10/07/2013 10/20/2014 03/02/2017   Cholestrol 0 - 200 mg/dL 201(H) 180 180 192 177   LDLCALC 0 - 99 mg/dL - 115(H) 116(H) 110(H) 112(H)   LDLDIRECT mg/dL 118.5 - - - -   HDL >39.00 mg/dL 54.10 52.70 50.80 61.60 46.40   Trlycerides 0.0 - 149.0 mg/dL 91.0 63.0 67.0 104.0 90.0      Capillary Blood Glucose: Lab Results  Component Value Date   GLUCAP 97 12/30/2016   GLUCAP 103 (H) 09/05/2015     Pulmonary Assessment Scores: Pulmonary Assessment Scores    Row Name 04/23/17 0721 04/23/17 1426       ADL UCSD   ADL Phase  Entry  Entry    SOB Score total  -  22      CAT Score   CAT Score  -  13 Entry      mMRC Score   mMRC Score  1  -       Pulmonary Function Assessment: Pulmonary Function Assessment - 04/20/17 1021      Breath   Bilateral Breath Sounds  Clear    Shortness of Breath  Yes;Limiting activity       Exercise Target Goals:    Exercise Program Goal: Individual exercise prescription set with THRR, safety & activity  barriers. Participant demonstrates ability to understand and report RPE using BORG scale, to self-measure pulse accurately, and to acknowledge the importance of the exercise prescription.  Exercise Prescription Goal: Starting with aerobic activity 30 plus minutes a day, 3 days per week for initial exercise prescription. Provide home exercise prescription and guidelines that participant acknowledges understanding prior to discharge.  Activity Barriers & Risk Stratification: Activity Barriers & Cardiac Risk Stratification - 04/20/17 1024      Activity Barriers & Cardiac Risk Stratification   Activity Barriers  None       6 Minute Walk: 6 Minute Walk    Row Name 04/23/17 0716         6 Minute Walk   Phase  Initial     Distance  1342 feet     Walk Time  6 minutes     # of Rest Breaks  0     MPH  2.54     METS  2.91     RPE  12      Perceived Dyspnea   2     Symptoms  No     Resting HR  89 bpm     Resting BP  152/81     Resting Oxygen Saturation   94 %     Exercise Oxygen Saturation  during 6 min walk  90 %     Max Ex. HR  111 bpm     Max Ex. BP  178/82     2 Minute Post BP  148/88       Interval HR   1 Minute HR  99     2 Minute HR  105     3 Minute HR  108     4 Minute HR  111     5 Minute HR  112     6 Minute HR  113     2 Minute Post HR  106     Interval Heart Rate?  Yes       Interval Oxygen   Interval Oxygen?  Yes     Baseline Oxygen Saturation %  94 %     1 Minute Oxygen Saturation %  95 %     1 Minute Liters of Oxygen  0 L     2 Minute Oxygen Saturation %  95 %     2 Minute Liters of Oxygen  0 L     3 Minute Oxygen Saturation %  92 %     3 Minute Liters of Oxygen  0 L     4 Minute Oxygen Saturation %  91 %     4 Minute Liters of Oxygen  0 L     5 Minute Oxygen Saturation %  90 %     5 Minute Liters of Oxygen  0 L     6 Minute Oxygen Saturation %  90 %     6 Minute Liters of Oxygen  0 L     2 Minute Post Oxygen Saturation %  92 %     2 Minute Post Liters of Oxygen  0 L        Oxygen Initial Assessment: Oxygen Initial Assessment - 04/23/17 0716      Initial 6 min Walk   Oxygen Used  None      Program Oxygen Prescription   Program Oxygen Prescription  None       Oxygen Re-Evaluation: Oxygen Re-Evaluation    Row Name 06/04/17 [phone]  Program Oxygen Prescription   Program Oxygen Prescription  None         Home Oxygen   Home Oxygen Device  None       Sleep Oxygen Prescription  None       Home Exercise Oxygen Prescription  None       Home at Rest Exercise Oxygen Prescription  None          Oxygen Discharge (Final Oxygen Re-Evaluation): Oxygen Re-Evaluation - 06/04/17 0818      Program Oxygen Prescription   Program Oxygen Prescription  None      Home Oxygen   Home Oxygen Device  None    Sleep Oxygen Prescription  None    Home Exercise Oxygen Prescription   None    Home at Rest Exercise Oxygen Prescription  None       Initial Exercise Prescription: Initial Exercise Prescription - 04/23/17 0700      Date of Initial Exercise RX and Referring Provider   Date  04/23/17    Referring Provider  Dr. Chase Caller      Treadmill   MPH  1.7    Grade  0    Minutes  17      NuStep   Level  2    SPM  80    Minutes  17    METs  1.5      Rower   Level  1    Watts  20    Minutes  17      Prescription Details   Frequency (times per week)  2    Duration  Progress to 45 minutes of aerobic exercise without signs/symptoms of physical distress      Intensity   THRR 40-80% of Max Heartrate  55-110    Ratings of Perceived Exertion  11-13    Perceived Dyspnea  0-4      Progression   Progression  Continue progressive overload as per policy without signs/symptoms or physical distress.      Resistance Training   Training Prescription  Yes    Weight  blue bands    Reps  10-15       Perform Capillary Blood Glucose checks as needed.  Exercise Prescription Changes: Exercise Prescription Changes    Row Name 04/28/17 1216 05/12/17 1200 05/14/17 1200 05/19/17 1200 05/28/17 1313     Response to Exercise   Blood Pressure (Admit)  100/60  122/70  140/84  108/72  116/70   Blood Pressure (Exercise)  130/74  152/72  138/70  120/74  140/68   Blood Pressure (Exit)  110/65  102/60  110/78  110/64  128/80   Heart Rate (Admit)  86 bpm  89 bpm  90 bpm  95 bpm  86 bpm   Heart Rate (Exercise)  113 bpm  100 bpm  107 bpm  93 bpm  102 bpm   Heart Rate (Exit)  93 bpm  91 bpm  88 bpm  95 bpm  96 bpm   Oxygen Saturation (Admit)  95 %  95 %  96 %  95 %  96 %   Oxygen Saturation (Exercise)  91 %  94 %  93 %  93 %  93 %   Oxygen Saturation (Exit)  95 %  95 %  97 %  95 %  96 %   Rating of Perceived Exertion (Exercise)  _0 Perceived Dyspnea (Exercise)  2  2  _0 Duration  Progress to 45 minutes of aerobic exercise without signs/symptoms of  physical distress  Progress to 45 minutes of aerobic exercise without signs/symptoms of physical distress  Progress to 45 minutes of aerobic exercise without signs/symptoms of physical distress  Progress to 45 minutes of aerobic exercise without signs/symptoms of physical distress  Progress to 45 minutes of aerobic exercise without signs/symptoms of physical distress   Intensity  Other (comment) HRR 40-80%  Other (comment) HRR 40-80%  Other (comment) HRR 40-80%  Other (comment) HRR 40-80%  Other (comment) HRR 40-80%     Resistance Training   Training Prescription  Yes  Yes  Yes  Yes  Yes   Weight  blue bands  blue bands  blue bands  blue bands  blue bands   Reps  10-15  10-15  10-15  10-15  10-15   Time  -  -  -  -  10 Minutes     Treadmill   MPH  1.7  -  -  -  -   Grade  0  -  -  -  -   Minutes  17  -  -  -  -     NuStep   Level  2  3  -  4  4   SPM  80  80  -  80  80   Minutes  17  17  -  17  17   METs  2.2  2.2  -  2.1  1.3     Rower   Level  _1 -   Watts  20  -  -  -  -   Minutes  _2 -     Track   Laps  -  _3 Minutes  -  _4 Exercise Comments:   Exercise Goals and Review:   Exercise Goals Re-Evaluation : Exercise Goals Re-Evaluation    Row Name 05/11/17 1628 05/28/17 0736           Exercise Goal Re-Evaluation   Exercise Goals Review  Increase Strength and Stamina;Increase Physical Activity;Able to understand and use Dyspnea scale;Able to understand and use rate of perceived exertion (RPE) scale;Knowledge and understanding of Target Heart Rate Range (THRR);Understanding of Exercise Prescription  Increase Strength and Stamina;Increase Physical Activity;Able to understand and use Dyspnea scale;Able to understand and use rate of perceived exertion (RPE) scale;Knowledge and understanding of Target Heart Rate Range (THRR);Understanding of Exercise Prescription      Comments  Patient has only attended one exercise session.  Has been out due to illness. Will cont. to progress as able.   Patient has only attended 4 rehab sesisons. Has been out of town for the holidays. Will cont. to monitor and progress as able.       Expected Outcomes  Through exercise at rehab and at home, patient will increase strength and stamina making ADL's easier to perform. Patient will also have a better understanding of safe exercise and what they are capable to do outside of clinical supervision.  Through exercise at rehab and at home, patient will increase strength and stamina making ADL's easier to perform. Patient will also have a better understanding of safe exercise and what they are capable to do outside of clinical supervision.  Discharge Exercise Prescription (Final Exercise Prescription Changes): Exercise Prescription Changes - 05/28/17 1313      Response to Exercise   Blood Pressure (Admit)  116/70    Blood Pressure (Exercise)  140/68    Blood Pressure (Exit)  128/80    Heart Rate (Admit)  86 bpm    Heart Rate (Exercise)  102 bpm    Heart Rate (Exit)  96 bpm    Oxygen Saturation (Admit)  96 %    Oxygen Saturation (Exercise)  93 %    Oxygen Saturation (Exit)  96 %    Rating of Perceived Exertion (Exercise)  11    Perceived Dyspnea (Exercise)  1    Duration  Progress to 45 minutes of aerobic exercise without signs/symptoms of physical distress    Intensity  Other (comment) HRR 40-80%      Resistance Training   Training Prescription  Yes    Weight  blue bands    Reps  10-15    Time  10 Minutes      NuStep   Level  4    SPM  80    Minutes  17    METs  1.3      Rower   Level  --    Minutes  --      Track   Laps  14    Minutes  17       Nutrition:  Target Goals: Understanding of nutrition guidelines, daily intake of sodium <1540m, cholesterol <2042m calories 30% from fat and 7% or less from saturated fats, daily to have 5 or more servings of fruits and vegetables.  Biometrics: Pre Biometrics -  04/20/17 1024      Pre Biometrics   Grip Strength  28 kg        Nutrition Therapy Plan and Nutrition Goals: Nutrition Therapy & Goals - 05/08/17 0934      Nutrition Therapy   Diet  General, healthful      Personal Nutrition Goals   Nutrition Goal  Identify food quantities necessary to achieve desired wt loss at graduation from pulmonary rehab.      Intervention Plan   Intervention  Prescribe, educate and counsel regarding individualized specific dietary modifications aiming towards targeted core components such as weight, hypertension, lipid management, diabetes, heart failure and other comorbidities.    Expected Outcomes  Short Term Goal: Understand basic principles of dietary content, such as calories, fat, sodium, cholesterol and nutrients.;Long Term Goal: Adherence to prescribed nutrition plan.       Nutrition Discharge: Rate Your Plate Scores: Nutrition Assessments - 05/08/17 0930      Rate Your Plate Scores   Pre Score  58       Nutrition Goals Re-Evaluation:   Nutrition Goals Discharge (Final Nutrition Goals Re-Evaluation):   Psychosocial: Target Goals: Acknowledge presence or absence of significant depression and/or stress, maximize coping skills, provide positive support system. Participant is able to verbalize types and ability to use techniques and skills needed for reducing stress and depression.  Initial Review & Psychosocial Screening: Initial Psych Review & Screening - 04/20/17 1028      Initial Review   Current issues with  None Identified      Family Dynamics   Good Support System?  Yes      Barriers   Psychosocial barriers to participate in program  There are no identifiable barriers or psychosocial needs.       Quality of Life Scores:   PHQ-9: Recent  Review Flowsheet Data    Depression screen [facility] 2/9 04/20/2017 10/30/2015 10/20/2014 10/14/2013 10/14/2013   Decreased Interest - 0 0 0 0   Down, Depressed, Hopeless 0 0 0 0 0   PHQ - 2 Score 0 0  0 0 0     Interpretation of Total Score  Total Score Depression Severity:  1-4 = Minimal depression, 5-9 = Mild depression, 10-14 = Moderate depression, 15-19 = Moderately severe depression, 20-27 = Severe depression   Psychosocial Evaluation and Intervention: Psychosocial Evaluation - 04/20/17 1029      Psychosocial Evaluation & Interventions   Interventions  Encouraged to exercise with the program and follow exercise prescription    Continue Psychosocial Services   No Follow up required       Psychosocial Re-Evaluation: Psychosocial Re-Evaluation    [place] Name 05/12/17 1417 06/04/17 0820           Psychosocial Re-Evaluation   Current issues with  None Identified  None Identified      Expected Outcomes  patient will remain free from psychosocial barriers to participation in pulmonary rehab  patient will remain free from psychosocial barriers to participation in pulmonary rehab      Interventions  Encouraged to attend Pulmonary Rehabilitation for the exercise  Encouraged to attend Pulmonary Rehabilitation for the exercise      Continue Psychosocial Services   No Follow up required  No Follow up required         Psychosocial Discharge (Final Psychosocial Re-Evaluation): Psychosocial Re-Evaluation - 06/04/17 0820      Psychosocial Re-Evaluation   Current issues with  None Identified    Expected Outcomes  patient will remain free from psychosocial barriers to participation in pulmonary rehab    Interventions  Encouraged to attend Pulmonary Rehabilitation for the exercise    Continue Psychosocial Services   No Follow up required       Education: Education Goals: Education classes will be provided on a weekly basis, covering required topics. Participant will state understanding/return demonstration of topics presented.  Learning Barriers/Preferences: Learning Barriers/Preferences - 04/20/17 1020      Learning Barriers/Preferences   Learning Barriers  None    Learning  Preferences  Individual Instruction;Skilled Demonstration       Education Topics: Risk Factor Reduction:  -Group instruction that is supported by a PowerPoint presentation. Instructor discusses the definition of a risk factor, different risk factors for pulmonary disease, and how the heart and lungs work together.     Nutrition for Pulmonary Patient:  -Group instruction provided by PowerPoint slides, verbal discussion, and written materials to support subject matter. The instructor gives an explanation and review of healthy diet recommendations, which includes a discussion on weight management, recommendations for fruit and vegetable consumption, as well as protein, fluid, caffeine, fiber, sodium, sugar, and alcohol. Tips for eating when patients are short of breath are discussed.   Pursed Lip Breathing:  -Group instruction that is supported by demonstration and informational handouts. Instructor discusses the benefits of pursed lip and diaphragmatic breathing and detailed demonstration on how to preform both.     Oxygen Safety:  -Group instruction provided by PowerPoint, verbal discussion, and written material to support subject matter. There is an overview of "What is Oxygen" and "Why do we need it".  Instructor also reviews how to create a safe environment for oxygen use, the importance of using oxygen as prescribed, and the risks of noncompliance. There is a brief discussion on traveling with oxygen and resources the  patient may utilize.   Oxygen Equipment:  -Group instruction provided by [facility] Staff utilizing handouts, written materials, and equipment demonstrations.   Signs and Symptoms:  -Group instruction provided by written material and verbal discussion to support subject matter. Warning signs and symptoms of infection, stroke, and heart attack are reviewed and when to call the physician/911 reinforced. Tips for preventing the spread of infection discussed.   Advanced  Directives:  -Group instruction provided by verbal instruction and written material to support subject matter. Instructor reviews Advanced Directive laws and proper instruction for filling out document.   Pulmonary Video:  -Group video education that reviews the importance of medication and oxygen compliance, exercise, good nutrition, pulmonary hygiene, and pursed lip and diaphragmatic breathing for the pulmonary patient.   Exercise for the Pulmonary Patient:  -Group instruction that is supported by a PowerPoint presentation. Instructor discusses benefits of exercise, core components of exercise, frequency, duration, and intensity of an exercise routine, importance of utilizing pulse oximetry during exercise, safety while exercising, and options of places to exercise outside of rehab.     Pulmonary Medications:  -Verbally interactive group education provided by instructor with focus on inhaled medications and proper administration.   Anatomy and Physiology of the Respiratory System and Intimacy:  -Group instruction provided by PowerPoint, verbal discussion, and written material to support subject matter. Instructor reviews respiratory cycle and anatomical components of the respiratory system and their functions. Instructor also reviews differences in obstructive and restrictive respiratory diseases with examples of each. Intimacy, Sex, and Sexuality differences are reviewed with a discussion on how relationships can change when diagnosed with pulmonary disease. Common sexual concerns are reviewed.   PULMONARY REHAB OTHER RESPIRATORY from 05/28/2017 in [place]  Date  05/28/17  Educator  RN  Instruction Review Code  2- meets goals/outcomes      MD DAY -A group question and answer session with a medical doctor that allows participants to ask questions that relate to their pulmonary disease state.   OTHER EDUCATION -Group or individual verbal, written, or  video instructions that support the educational goals of the pulmonary rehab program.   Knowledge Questionnaire Score: Knowledge Questionnaire Score - 04/23/17 1425      Knowledge Questionnaire Score   Pre Score  10/13       Core Components/Risk Factors/Patient Goals at Admission: Personal Goals and Risk Factors at Admission - 04/20/17 1027      Core Components/Risk Factors/Patient Goals on Admission   Improve shortness of breath with ADL's  Yes    Intervention  Provide education, individualized exercise plan and daily activity instruction to help decrease symptoms of SOB with activities of daily living.    Expected Outcomes  Short Term: Achieves a reduction of symptoms when performing activities of daily living.    Develop more efficient breathing techniques such as purse lipped breathing and diaphragmatic breathing; and practicing self-pacing with activity  Yes    Intervention  Provide education, demonstration and support about specific breathing techniuqes utilized for more efficient breathing. Include techniques such as pursed lipped breathing, diaphragmatic breathing and self-pacing activity.    Expected Outcomes  Short Term: Participant will be able to demonstrate and use breathing techniques as needed throughout daily activities.    Increase knowledge of respiratory medications and ability to use respiratory devices properly   Yes    Intervention  Provide education and demonstration as needed of appropriate use of medications, inhalers, and oxygen therapy.    Expected  Outcomes  Short Term: Achieves understanding of medications use. Understands that oxygen is a medication prescribed by physician. Demonstrates appropriate use of inhaler and oxygen therapy.       Core Components/Risk Factors/Patient Goals Review:  Goals and Risk Factor Review    Row Name 05/12/17 1415 06/04/17 0819           Core Components/Risk Factors/Patient Goals Review   Personal Goals Review  Improve  shortness of breath with ADL's;Increase knowledge of respiratory medications and ability to use respiratory devices properly.;Develop more efficient breathing techniques such as purse lipped breathing and diaphragmatic breathing and practicing self-pacing with activity.  Improve shortness of breath with ADL's;Increase knowledge of respiratory medications and ability to use respiratory devices properly.;Develop more efficient breathing techniques such as purse lipped breathing and diaphragmatic breathing and practicing self-pacing with activity.      Review  patient is doing well in pulmonary rehab. He seems to enjoy his sessions and engages in conversation with the staff and classmates when here. He missed both exercise sessions last week related to a head cold. He has only attended 2 exercise sessions since admission and it is too soon to evaluate progress towards goals.  Patient is doing great in pulmonary rehab. He is tolerating workload increases and states he is seeing an improvement in his shortness of breath at home with activities. He is utilizing PLB when prompted.      Expected Outcomes  see admission outcomes  see admission outcomes         Core Components/Risk Factors/Patient Goals at Discharge (Final Review):  Goals and Risk Factor Review - 06/04/17 0819      Core Components/Risk Factors/Patient Goals Review   Personal Goals Review  Improve shortness of breath with ADL's;Increase knowledge of respiratory medications and ability to use respiratory devices properly.;Develop more efficient breathing techniques such as purse lipped breathing and diaphragmatic breathing and practicing self-pacing with activity.    Review  Patient is doing great in pulmonary rehab. He is tolerating workload increases and states he is seeing an improvement in his shortness of breath at home with activities. He is utilizing PLB when prompted.    Expected Outcomes  see admission outcomes       ITP  Comments:   Comments: Patient has attended 5 sessions since admission to pulmonary rehab.

## 2017-06-11 ENCOUNTER — Encounter (HOSPITAL_COMMUNITY)
Admission: RE | Admit: 2017-06-11 | Discharge: 2017-06-11 | Disposition: A | Discharge status: Home / Self Care | Payer: Medicare Other | Source: Ambulatory Visit | Attending: Internal Medicine | Admitting: Internal Medicine

## 2017-06-11 VITALS — Wt 173.7 lb

## 2017-06-11 DIAGNOSIS — M1990 Unspecified osteoarthritis, unspecified site: Secondary | ICD-10-CM | POA: Insufficient documentation

## 2017-06-11 DIAGNOSIS — Z7982 Long term (current) use of aspirin: Secondary | ICD-10-CM | POA: Insufficient documentation

## 2017-06-11 DIAGNOSIS — Z79899 Other long term (current) drug therapy: Secondary | ICD-10-CM | POA: Diagnosis not present

## 2017-06-11 DIAGNOSIS — J439 Emphysema, unspecified: Secondary | ICD-10-CM | POA: Insufficient documentation

## 2017-06-11 DIAGNOSIS — Z85828 Personal history of other malignant neoplasm of skin: Secondary | ICD-10-CM | POA: Diagnosis not present

## 2017-06-11 DIAGNOSIS — Z87891 Personal history of nicotine dependence: Secondary | ICD-10-CM | POA: Insufficient documentation

## 2017-06-11 NOTE — Progress Notes (Signed)
Daily Session Note  Patient Details  Name: Jason Summers MRN: [id number] Date of Birth: 04/25/1932 Referring Provider:     Pulmonary Rehab Walk Test from 04/21/2017 in [place]  Referring Provider  Dr. Chase Caller      Encounter Date: 06/11/2017  Check In: Session Check In - 06/11/17 1112      Check-In   Location  [facility]    Staff Present  Rodney Langton, RN;Stanley Helmuth Rollene Rotunda, RN, BSN;Molly diVincenzo, MS, ACSM RCEP, Exercise Physiologist;Joan Leonia Reeves, RN, BSN    Supervising physician immediately available to respond to emergencies  [facility] immediately available    Physician(s)  Dr. Eliseo Squires    Medication changes reported      No    Fall or balance concerns reported     No    Tobacco Cessation  No Change    Warm-up and Cool-down  Performed as group-led instruction    Resistance Training Performed  Yes    VAD Patient?  No      Pain Assessment   Currently in Pain?  No/denies    Multiple Pain Sites  No       Capillary Blood Glucose: No results found for this or any previous visit (from the past 24 hour(s)).  Exercise Prescription Changes - 06/11/17 1329      Response to Exercise   Blood Pressure (Admit)  103/58    Blood Pressure (Exercise)  130/64    Blood Pressure (Exit)  110/66    Heart Rate (Admit)  77 bpm    Heart Rate (Exercise)  98 bpm    Heart Rate (Exit)  86 bpm    Oxygen Saturation (Admit)  96 %    Oxygen Saturation (Exercise)  94 %    Oxygen Saturation (Exit)  97 %    Rating of Perceived Exertion (Exercise)  9    Perceived Dyspnea (Exercise)  3    Duration  Progress to 45 minutes of aerobic exercise without signs/symptoms of physical distress    Intensity  THRR unchanged      Progression   Progression  Continue to progress workloads to maintain intensity without signs/symptoms of physical distress.      Resistance Training   Training Prescription  Yes    Weight  blue bands    Reps  10-15    Time  10  Minutes      Bike   Level  1    Minutes  17      NuStep   Level  4    Minutes  17    METs  2.2       Social History   Tobacco Use  Smoking Status Former Smoker  . Packs/day: 0.20  . Years: 62.00  . Pack years: 12.40  . Types: Cigarettes  . Last attempt to quit: 03/10/2011  . Years since quitting: 6.2  Smokeless Tobacco Never Used  Tobacco Comment   smokes 1 ciagettes occasionally    Goals Met:  Improved SOB with ADL's Using PLB without cueing & demonstrates good technique Exercise tolerated well No report of cardiac concerns or symptoms Strength training completed today  Goals Unmet:  Not Applicable  Comments: Service time is from 1030 to [place]   Dr. Rush Farmer is Medical Director for Pulmonary Rehab at [facility].

## 2017-06-16 ENCOUNTER — Encounter (HOSPITAL_COMMUNITY)
Admission: RE | Admit: 2017-06-16 | Discharge: 2017-06-16 | Disposition: A | Discharge status: Home / Self Care | Payer: Medicare Other | Source: Ambulatory Visit | Attending: Internal Medicine | Admitting: Internal Medicine

## 2017-06-16 VITALS — Wt 173.9 lb

## 2017-06-16 DIAGNOSIS — J439 Emphysema, unspecified: Secondary | ICD-10-CM

## 2017-06-16 NOTE — Progress Notes (Signed)
Daily Session Note  Patient Details  Name: Daquane Aguilar MRN: [id number] Date of Birth: January 22, 1932 Referring Provider:     Pulmonary Rehab Walk Test from 04/21/2017 in [place]  Referring Provider  Dr. Chase Caller      Encounter Date: 06/16/2017  Check In: Session Check In - 06/16/17 1030      Check-In   Location  [facility]    Staff Present  Rosebud Poles, RN, BSN;Bertice Risse, MS, ACSM RCEP, Exercise Physiologist;Lisa Ysidro Evert, RN;Portia Rollene Rotunda, RN, BSN    Supervising physician immediately available to respond to emergencies  [facility] immediately available    Physician(s)  Dr. Eliseo Squires    Medication changes reported      No    Fall or balance concerns reported     No    Tobacco Cessation  No Change    Warm-up and Cool-down  Performed as group-led instruction    Resistance Training Performed  Yes    VAD Patient?  No      Pain Assessment   Currently in Pain?  No/denies    Multiple Pain Sites  No       Capillary Blood Glucose: No results found for this or any previous visit (from the past 24 hour(s)).  Exercise Prescription Changes - 06/16/17 1200      Response to Exercise   Blood Pressure (Admit)  108/60    Blood Pressure (Exercise)  136/70    Blood Pressure (Exit)  110/72    Heart Rate (Admit)  79 bpm    Heart Rate (Exercise)  108 bpm    Heart Rate (Exit)  80 bpm    Oxygen Saturation (Admit)  95 %    Oxygen Saturation (Exercise)  91 %    Oxygen Saturation (Exit)  95 %    Rating of Perceived Exertion (Exercise)  9    Perceived Dyspnea (Exercise)  1    Duration  Progress to 45 minutes of aerobic exercise without signs/symptoms of physical distress    Intensity  THRR unchanged      Progression   Progression  Continue to progress workloads to maintain intensity without signs/symptoms of physical distress.      Resistance Training   Training Prescription  Yes    Weight  blue bands    Reps  10-15    Time  10  Minutes      Bike   Level  1    Minutes  17      NuStep   Level  5    Minutes  17    METs  2.2      Track   Laps  21    Minutes  17       Social History   Tobacco Use  Smoking Status Former Smoker  . Packs/day: 0.20  . Years: 62.00  . Pack years: 12.40  . Types: Cigarettes  . Last attempt to quit: 03/10/2011  . Years since quitting: 6.2  Smokeless Tobacco Never Used  Tobacco Comment   smokes 1 ciagettes occasionally    Goals Met:  Exercise tolerated well No report of cardiac concerns or symptoms Strength training completed today  Goals Unmet:  Not Applicable  Comments: Service time is from 10:30a to 12:00p    Dr. Rush Farmer is Medical Director for Pulmonary Rehab at [facility].

## 2017-06-18 ENCOUNTER — Encounter (HOSPITAL_COMMUNITY)
Admission: RE | Admit: 2017-06-18 | Discharge: 2017-06-18 | Disposition: A | Discharge status: Home / Self Care | Payer: Medicare Other | Source: Ambulatory Visit | Attending: Internal Medicine | Admitting: Internal Medicine

## 2017-06-18 VITALS — Wt 172.6 lb

## 2017-06-18 DIAGNOSIS — J439 Emphysema, unspecified: Secondary | ICD-10-CM | POA: Diagnosis not present

## 2017-06-18 NOTE — Progress Notes (Signed)
Daily Session Note  Patient Details  Name: Jason Summers MRN: [id number] Date of Birth: 16-Jan-1932 Referring Provider:     Pulmonary Rehab Walk Test from 04/21/2017 in [place]  Referring Provider  Dr. Chase Caller      Encounter Date: 06/18/2017  Check In: Session Check In - 06/18/17 1030      Check-In   Location  [facility]    Staff Present  Rosebud Poles, RN, BSN;Molly diVincenzo, MS, ACSM RCEP, Exercise Physiologist;Lisa Ysidro Evert, RN;Argel Pablo Rollene Rotunda, RN, BSN    Supervising physician immediately available to respond to emergencies  [facility] immediately available    Physician(s)  Dr. Cruzita Lederer    Medication changes reported      No    Fall or balance concerns reported     No    Tobacco Cessation  No Change    Warm-up and Cool-down  Performed as group-led instruction    Resistance Training Performed  Yes    VAD Patient?  No      Pain Assessment   Currently in Pain?  No/denies    Multiple Pain Sites  No       Capillary Blood Glucose: No results found for this or any previous visit (from the past 24 hour(s)).  Exercise Prescription Changes - 06/18/17 1244      Response to Exercise   Blood Pressure (Admit)  104/64    Blood Pressure (Exercise)  118/66    Blood Pressure (Exit)  118/70    Heart Rate (Admit)  86 bpm    Heart Rate (Exercise)  106 bpm    Heart Rate (Exit)  96 bpm    Oxygen Saturation (Admit)  96 %    Oxygen Saturation (Exercise)  92 %    Oxygen Saturation (Exit)  96 %    Rating of Perceived Exertion (Exercise)  9    Perceived Dyspnea (Exercise)  1    Duration  Progress to 45 minutes of aerobic exercise without signs/symptoms of physical distress    Intensity  THRR unchanged      Progression   Progression  Continue to progress workloads to maintain intensity without signs/symptoms of physical distress.      Resistance Training   Training Prescription  Yes    Weight  blue bands    Reps  10-15    Time  10  Minutes      Bike   Level  1    Minutes  17      Track   Laps  7    Minutes  17       Social History   Tobacco Use  Smoking Status Former Smoker  . Packs/day: 0.20  . Years: 62.00  . Pack years: 12.40  . Types: Cigarettes  . Last attempt to quit: 03/10/2011  . Years since quitting: 6.2  Smokeless Tobacco Never Used  Tobacco Comment   smokes 1 ciagettes occasionally    Goals Met:  Independence with exercise equipment Improved SOB with ADL's Using PLB without cueing & demonstrates good technique Exercise tolerated well No report of cardiac concerns or symptoms Strength training completed today  Goals Unmet:  Not Applicable  Comments: Service time is from 1030 to 1230   Dr. Rush Farmer is Medical Director for Pulmonary Rehab at [facility].

## 2017-06-23 ENCOUNTER — Encounter (HOSPITAL_COMMUNITY)
Admission: RE | Admit: 2017-06-23 | Discharge: 2017-06-23 | Disposition: A | Discharge status: Home / Self Care | Payer: Medicare Other | Source: Ambulatory Visit | Attending: Internal Medicine | Admitting: Internal Medicine

## 2017-06-23 ENCOUNTER — Other Ambulatory Visit: Payer: Self-pay | Admitting: Internal Medicine

## 2017-06-23 VITALS — Wt 173.7 lb

## 2017-06-23 DIAGNOSIS — J439 Emphysema, unspecified: Secondary | ICD-10-CM

## 2017-06-23 NOTE — Progress Notes (Signed)
Daily Session Note  Patient Details  Name: Jason Summers MRN: [id number] Date of Birth: 1931-08-03 Referring Provider:     Pulmonary Rehab Walk Test from 04/21/2017 in [place]  Referring Provider  Dr. Chase Caller      Encounter Date: 06/23/2017  Check In: Session Check In - 06/23/17 1030      Check-In   Location  [facility]    Staff Present  Rosebud Poles, RN, Luisa Hart, RN, BSN;Molly diVincenzo, MS, ACSM RCEP, Exercise Physiologist;Lisa Ysidro Evert, RN    Supervising physician immediately available to respond to emergencies  [facility] immediately available    Physician(s)  Dr. Eliseo Squires    Medication changes reported      No    Fall or balance concerns reported     No    Tobacco Cessation  No Change    Warm-up and Cool-down  Performed as group-led instruction    Resistance Training Performed  Yes    VAD Patient?  No      Pain Assessment   Currently in Pain?  No/denies    Multiple Pain Sites  No       Capillary Blood Glucose: No results found for this or any previous visit (from the past 24 hour(s)).  Exercise Prescription Changes - 06/23/17 1200      Response to Exercise   Blood Pressure (Admit)  108/62    Blood Pressure (Exercise)  120/70    Blood Pressure (Exit)  108/64    Heart Rate (Admit)  68 bpm    Heart Rate (Exercise)  102 bpm    Heart Rate (Exit)  83 bpm    Oxygen Saturation (Admit)  96 %    Oxygen Saturation (Exercise)  94 %    Oxygen Saturation (Exit)  95 %    Rating of Perceived Exertion (Exercise)  9    Perceived Dyspnea (Exercise)  0    Duration  Progress to 45 minutes of aerobic exercise without signs/symptoms of physical distress    Intensity  THRR unchanged      Progression   Progression  Continue to progress workloads to maintain intensity without signs/symptoms of physical distress.      Resistance Training   Training Prescription  Yes    Weight  blue bands    Reps  10-15    Time  10  Minutes      Bike   Level  1    Minutes  17      NuStep   Level  5    Minutes  17    METs  2.5      Track   Laps  21    Minutes  17       Social History   Tobacco Use  Smoking Status Former Smoker  . Packs/day: 0.20  . Years: 62.00  . Pack years: 12.40  . Types: Cigarettes  . Last attempt to quit: 03/10/2011  . Years since quitting: 6.2  Smokeless Tobacco Never Used  Tobacco Comment   smokes 1 ciagettes occasionally    Goals Met:  Exercise tolerated well Strength training completed today  Goals Unmet:  Not Applicable  Comments: Service time is from 1030 to 1200    Dr. Rush Farmer is Medical Director for Pulmonary Rehab at [facility].

## 2017-06-25 ENCOUNTER — Encounter (HOSPITAL_COMMUNITY)
Admission: RE | Admit: 2017-06-25 | Discharge: 2017-06-25 | Disposition: A | Discharge status: Home / Self Care | Payer: Medicare Other | Source: Ambulatory Visit | Attending: Internal Medicine | Admitting: Internal Medicine

## 2017-06-25 VITALS — Wt 172.2 lb

## 2017-06-25 DIAGNOSIS — J439 Emphysema, unspecified: Secondary | ICD-10-CM | POA: Diagnosis not present

## 2017-06-25 NOTE — Progress Notes (Signed)
Jason Summers 84 y.o. male   DOB: [birth year] MRN: [id number]          Nutrition 1. Pulmonary emphysema, unspecified emphysema type ([place])   2.      Stage III non-small cell Lung CA Note Spoke with pt. Pt is making healthy food choices the majority of the time.  Pt's Rate Your Plate results reviewed with pt. Pt expressed understanding of the information reviewed via feedback method.    Nutrition Diagnosis ? Food-and nutrition-related knowledge deficit related to lack of exposure to information as related to diagnosis of pulmonary disease ? Overweight related to excessive energy intake as evidenced by a BMI of 26.96  Nutrition Intervention ? Pt's individual nutrition plan and goals reviewed with pt. ? Benefits of adopting healthy eating habits discussed when pt's Rate Your Plate reviewed.  Goal(s) 1. Identify food quantities necessary to achieve desired wt loss at graduation from pulmonary rehab.  Plan:  Pt to attend Pulmonary Nutrition class - met 06/11/17 Will provide client-centered nutrition education as part of interdisciplinary care.   Monitor and evaluate progress toward nutrition goal with team.  Monitor and Evaluate progress toward nutrition goal with team.   Derek Mound, M.Ed, RD, LDN, CDE 06/25/2017 12:16 PM

## 2017-06-25 NOTE — Progress Notes (Signed)
Daily Session Note  Patient Details  Name: Jason Summers MRN: [id number] Date of Birth: 15-Aug-1931 Referring Provider:     Pulmonary Rehab Walk Test from 04/21/2017 in [place]  Referring Provider  Dr. Chase Caller      Encounter Date: 06/25/2017  Check In: Session Check In - 06/25/17 1030      Check-In   Location  [facility]    Staff Present  Rosebud Poles, RN, BSN;Cherisa Brucker, MS, ACSM RCEP, Exercise Physiologist;Portia Rollene Rotunda, RN, Roque Cash, RN    Supervising physician immediately available to respond to emergencies  [facility] immediately available    Physician(s)  Dr. Eliseo Squires    Medication changes reported      No    Fall or balance concerns reported     No    Tobacco Cessation  No Change    Warm-up and Cool-down  Performed as group-led instruction    Resistance Training Performed  Yes    VAD Patient?  No      Pain Assessment   Currently in Pain?  No/denies    Multiple Pain Sites  No       Capillary Blood Glucose: No results found for this or any previous visit (from the past 24 hour(s)).  Exercise Prescription Changes - 06/25/17 1200      Response to Exercise   Blood Pressure (Admit)  100/66    Blood Pressure (Exercise)  130/80    Blood Pressure (Exit)  116/60    Heart Rate (Admit)  75 bpm    Heart Rate (Exercise)  94 bpm    Heart Rate (Exit)  82 bpm    Oxygen Saturation (Admit)  96 %    Oxygen Saturation (Exercise)  94 %    Oxygen Saturation (Exit)  96 %    Rating of Perceived Exertion (Exercise)  9    Perceived Dyspnea (Exercise)  0    Duration  Progress to 45 minutes of aerobic exercise without signs/symptoms of physical distress    Intensity  THRR unchanged      Progression   Progression  Continue to progress workloads to maintain intensity without signs/symptoms of physical distress.      Resistance Training   Training Prescription  Yes    Weight  blue bands    Reps  10-15    Time  10  Minutes      Bike   Level  1    Minutes  17      NuStep   Level  6    Minutes  17    METs  2.5       Social History   Tobacco Use  Smoking Status Former Smoker  . Packs/day: 0.20  . Years: 62.00  . Pack years: 12.40  . Types: Cigarettes  . Last attempt to quit: 03/10/2011  . Years since quitting: 6.2  Smokeless Tobacco Never Used  Tobacco Comment   smokes 1 ciagettes occasionally    Goals Met:  Exercise tolerated well Queuing for purse lip breathing No report of cardiac concerns or symptoms  Goals Unmet:  Not Applicable  Comments: Service time is from 10:30a to 12:15p    Dr. Rush Farmer is Medical Director for Pulmonary Rehab at [facility].

## 2017-06-25 NOTE — Progress Notes (Signed)
.  I have reviewed a Home Exercise Prescription with Jason Summers . [name] is currently exercising at home.  The patient was advised to walk 3 days a week for 30 minutes.  [name] and I discussed how to progress their exercise prescription.  The patient stated that their goals were to decrease shortness of breath and to feel better overall.  The patient stated that they understand the exercise prescription.  We reviewed exercise guidelines, target heart rate during exercise, oxygen use, weather, home pulse oximeter, endpoints for exercise, and goals.  Patient is encouraged to come to me with any questions. I will continue to follow up with the patient to assist them with progression and safety.

## 2017-06-29 ENCOUNTER — Encounter: Payer: Self-pay | Admitting: Internal Medicine

## 2017-06-29 ENCOUNTER — Ambulatory Visit (INDEPENDENT_AMBULATORY_CARE_PROVIDER_SITE_OTHER): Payer: Medicare Other | Admitting: Internal Medicine

## 2017-06-29 ENCOUNTER — Other Ambulatory Visit (INDEPENDENT_AMBULATORY_CARE_PROVIDER_SITE_OTHER): Payer: Medicare Other

## 2017-06-29 VITALS — BP 114/62 | HR 70 | Ht 67.0 in | Wt 173.6 lb

## 2017-06-29 DIAGNOSIS — R5381 Other malaise: Secondary | ICD-10-CM | POA: Diagnosis not present

## 2017-06-29 DIAGNOSIS — J438 Other emphysema: Secondary | ICD-10-CM

## 2017-06-29 DIAGNOSIS — E559 Vitamin D deficiency, unspecified: Secondary | ICD-10-CM

## 2017-06-29 DIAGNOSIS — R5382 Chronic fatigue, unspecified: Secondary | ICD-10-CM

## 2017-06-29 DIAGNOSIS — C3492 Malignant neoplasm of unspecified part of left bronchus or lung: Secondary | ICD-10-CM | POA: Diagnosis not present

## 2017-06-29 LAB — VITAMIN D 25 HYDROXY (VIT D DEFICIENCY, FRACTURES): VITD: 37.26 ng/mL (ref 30.00–100.00)

## 2017-06-29 NOTE — Patient Instructions (Addendum)
Chronic fatigue Physical deconditioning   - glad rehab worked really well for this and you are lot better - continue heab  Non-small cell cancer of left lung ([place])  - per [name]  Other emphysema ([place]) - albuterol as needed - respect desire nnot to be on maintenance  Vit D Def  - recheck levels to guide future therapy  Followup  - May 2019 or sooner if needed

## 2017-06-29 NOTE — Progress Notes (Signed)
Subjective:     Patient ID: Jason Summers, male   DOB: 23-May-1932, 84 y.o.   MRN: [id number]  HPI  IOV 12/16/2016  Chief Complaint  Patient presents with  . Acute Visit    Pt c/o chest tightness, mid back pain, and right lateral rib pain when pt takes a deep breath in. Pt also c/o mild dry cough with some chest congestion. Pt denies f/c/s.     84 year old male with stage III non-small cell lung cancer and resultant radiation related pleural effusion. This supposed to go to [place] native country in June 2018 but he canceled because of his declining health. He tells me that his functional status is pretty good. His appetite is pretty good. However he is having worsening lower thoracic back pain. He saw my colleague approximately one month ago. Chest x-ray was unchanged. He was advised nonsteroidal anti-inflammatory drugs and Tylenol for pain relief. But now the pain has broken through. The pain is worse. It is present in the midthoracic right infra-axillary and lower anterior chest area. It gets worse with inspiration. It is severe. He feels he might need something stronger for pain relief. There is no neurologic deficit. There is no constipation. His effort tolerance is okay with there is slight increased fatigue.   OV 03/27/2017  Chief Complaint  Patient presents with  . Follow-up    Pt still has some pain in his back but it is better than it ws at last visit. Pt has noticed that he will occ. lose his voice and he has become weak, especially in the mornings. [name] states that pt has been taking some OTC for a cough and states that occ. it will be hard for him to breathe and does have occ. CP.    84 year old male with non-small cell lung cancer on observation therapy. I last saw him in July 2018. At that time he was bothered by left-sided chest pain. There was concern about cancer recurrence but a PET scan at that time showed improvement. Since then he's been on observation therapy.He now  presents with his wife. He tells me that the pain is not much of an issue. He only has very mild shortness of breath. But they both tell me that he is extremely fatigued. Particularly early in the morning. He has insomnia. Wife does think that he is deconditioned. Last lab work in August 2018 was fine. Has no symptoms of COPD exacerbation. He is up-to-date with his flu shot.he has had some headaches and neurology for that He might be depressed Walking desaturation test 185 feet 3 laps on room air: He finished it without any problems other than mild shortness of breath. Resting heart rate was 87/m. Final heart rate was 98/m. Resting pulse ox 100% final pulse ox 98%.   OV 06/29/2017  Chief Complaint  Patient presents with  . Follow-up    Pt and his wife states he is doing much better since last visit. Pt has been going to PT which has helped him gain strengh back, walking better, and brething better.   He has mild emphysema and this is follow-up for that.  He is here with his wife.  They tell me that he is not taking his inhalers.  Because it really does not benefit him.  The bigger issues that have been recently dealing with other issues of fatigue and low motivation and physical deconditioning.  Therefore at last visit we put him into pulmonary rehabilitation.  We checked a  lot of his blood work it ended up that his vitamin D was very low.  Put him on vitamin D replacement.  Now he reports that with both vitamin D replacement and with pulmonary rehabilitation he has significant improvement in his fatigue and energy levels.  He feels back to baseline.  In particular the fetal pulmonary rehabilitation has helped him.  They both are worried about the extent of lung cancer and the odds of recurrence.  The having follow-up CT scan with Dr. Julien Nordmann in a month or 2.  They feel that if his physical condition will allow and the CT scan does not show any recurrence he would want to visit [place] in the summer 2019.   Otherwise feeling good.     Lung cancer DIAGNOSIS: Stage IIIA (T3, N2, M0) non-small cell lung cancer, poorly differentiated squamous cell carcinoma diagnosed in February 2017 with PDL 1 expression of 35%, presented with large left lower lobe lung mass in addition to left upper lobe lung nodule and mediastinal lymphadenopathy.PRIOR THERAPY: Course of concurrent chemoradiation with weekly carboplatin for AUC of 2 and paclitaxel 45 MG/M2. Status post 7 cycles, last dose was given 11/19/2015 with partial response.    has a past medical history of Arthritis, Cancer ([place]), Hemorrhoids, Lateral epicondylitis (tennis elbow), Non-small cell lung cancer (NSCLC) ([place]) (dx'f 07/30/15), Odynophagia (11/06/2015), and Spondylosis of cervical joint.   reports that he quit smoking about 6 years ago. His smoking use included cigarettes. He has a 12.40 pack-year smoking history. he has never used smokeless tobacco.  Past Surgical History:  Procedure Laterality Date  . COLONOSCOPY  2003  . KNEE ARTHROSCOPY  2006   right    Allergies  Allergen Reactions  . Gentamicin Other (See Comments)    Made eyes red, swollen, hot feeling  . Loteprednol-Tobramycin     Other reaction(s): Other (See Comments) Made eyes Red, Swollen, Red, Warm feeling     Immunization History  Administered Date(s) Administered  . Influenza Split 03/19/2011, 03/12/2012  . Influenza Whole 06/09/1997, 04/22/2007, 03/14/2008, 03/28/2009, 05/09/2010  . Influenza, High Dose Seasonal PF 04/14/2013, 04/11/2015, 03/02/2017  . Influenza,inj,Quad PF,6+ Mos 05/07/2016  . Influenza-Unspecified 04/10/2014  . Pneumococcal Conjugate-13 10/14/2013  . Pneumococcal Polysaccharide-23 06/09/2004, 10/04/2012  . Td 06/09/2005  . Zoster 02/04/2015    Family History  Problem Relation Age of Onset  . Hypertension Mother   . Heart disease Mother   . Coronary artery disease Unknown      Current Outpatient Medications:  .  aspirin 81 MG EC tablet,  Take 81 mg by mouth every other day. Reported on 10/16/2015, Disp: , Rfl:  .  Cholecalciferol (VITAMIN D3) 50000 units TABS, Take 50,000 Units once a week by mouth. Then take 50,000 monthly after 12 weeks., Disp: 51 tablet, Rfl: 0 .  esomeprazole (NEXIUM) 40 MG capsule, Take 1 capsule (40 mg total) by mouth daily at 12 noon., Disp: 90 capsule, Rfl: 3 .  fish oil-omega-3 fatty acids 1000 MG capsule, Take 2 g by mouth daily. , Disp: , Rfl:  .  glucosamine-chondroitin 500-400 MG tablet, Take 1 tablet by mouth daily. Reported on 09/11/2015, Disp: , Rfl:     Review of Systems     Objective:   Physical Exam  Constitutional: He is oriented to person, place, and time. He appears well-developed and well-nourished. No distress.  HENT:  Head: Normocephalic and atraumatic.  Right Ear: External ear normal.  Left Ear: External ear normal.  Mouth/Throat: Oropharynx is clear and  moist. No oropharyngeal exudate.  Eyes: Conjunctivae and EOM are normal. Pupils are equal, round, and reactive to light. Right eye exhibits no discharge. Left eye exhibits no discharge. No scleral icterus.  Neck: Normal range of motion. Neck supple. No JVD present. No tracheal deviation present. No thyromegaly present.  Cardiovascular: Normal rate, regular rhythm and intact distal pulses. Exam reveals no gallop and no friction rub.  No murmur heard. Pulmonary/Chest: Effort normal and breath sounds normal. No respiratory distress. He has no wheezes. He has no rales. He exhibits no tenderness.  Abdominal: Soft. Bowel sounds are normal. He exhibits no distension and no mass. There is no tenderness. There is no rebound and no guarding.  Musculoskeletal: Normal range of motion. He exhibits no edema or tenderness.  Lymphadenopathy:    He has no cervical adenopathy.  Neurological: He is alert and oriented to person, place, and time. He has normal reflexes. No cranial nerve deficit. Coordination normal.  Skin: Skin is warm and dry. No rash  noted. He is not diaphoretic. No erythema. No pallor.  Psychiatric: He has a normal mood and affect. His behavior is normal. Judgment and thought content normal.  Nursing note and vitals reviewed.  Vitals:   06/29/17 1204  BP: 114/62  Pulse: 70  SpO2: 97%   Vitals:   06/29/17 1204  BP: 114/62  Pulse: 70  SpO2: 97%  Weight: 173 lb 9.6 oz (78.7 kg)  Height: 5' 7" (1.702 m)    Estimated body mass index is 27.19 kg/m as calculated from the following:   Height as of this encounter: 5' 7" (1.702 m).   Weight as of this encounter: 173 lb 9.6 oz (78.7 kg).     Assessment:        ICD-10-CM   1. Chronic fatigue R53.82   2. Physical deconditioning R53.81   3. Non-small cell cancer of left lung (HCC) C34.92   4. Other emphysema ([place]) J43.8   5. Vitamin D deficiency E55.9 Vitamin D (25 hydroxy)       Plan:     Chronic fatigue Physical deconditioning   - glad rehab worked really well for this and you are lot better - continue heab  Non-small cell cancer of left lung ([place])  - per [name]  Other emphysema ([address]) - albuterol as needed - respect desire nnot to be on maintenance  Vit D Def  - recheck levels to guide future therapy  Followup  - May 2019 or sooner if needed   > 50% of this > 25 min visit spent in face to face counseling or coordination of care    Dr. Brand Males, M.D., [facility].C.P Pulmonary and Critical Care Medicine Staff Physician, [place] Director - Interstitial Lung Disease  Program  Pulmonary [facility] at [place], [place], [address]  Pager: [phone], If no answer or between  15:00h - 7:00h: call [phone] Telephone: [phone]

## 2017-06-30 ENCOUNTER — Encounter (HOSPITAL_COMMUNITY)
Admission: RE | Admit: 2017-06-30 | Discharge: 2017-06-30 | Disposition: A | Discharge status: Home / Self Care | Payer: Medicare Other | Source: Ambulatory Visit | Attending: Internal Medicine | Admitting: Internal Medicine

## 2017-06-30 ENCOUNTER — Telehealth: Payer: Self-pay | Admitting: Internal Medicine

## 2017-06-30 VITALS — Wt 173.3 lb

## 2017-06-30 DIAGNOSIS — J439 Emphysema, unspecified: Secondary | ICD-10-CM

## 2017-06-30 NOTE — Telephone Encounter (Signed)
Vit D has normalized. He has to take 50,000 units Vit D3 once a month first of each month  Dr. Brand Males, M.D., [facility].C.P Pulmonary and Critical Care Medicine Staff Physician, [place] Director - Interstitial Lung Disease  Program  Pulmonary [place] at [place], [place], [address]  Pager: [phone], If no answer or between  15:00h - 7:00h: call [phone] Telephone: [phone]

## 2017-06-30 NOTE — Progress Notes (Signed)
Pulmonary Individual Treatment Plan  Patient Details  Name: Jason Summers MRN: [id number] Date of Birth: May 10, 1932 Referring Provider:     Pulmonary Rehab Walk Test from 04/21/2017 in [place]  Referring Provider  Dr. Chase Caller      Initial Encounter Date:    Pulmonary Rehab Walk Test from 04/21/2017 in [place]  Date  04/23/17  Referring Provider  Dr. Chase Caller      Visit Diagnosis: Pulmonary emphysema, unspecified emphysema type ([place])  Patient's Home Medications on Admission:   Current Outpatient Medications:  .  aspirin 81 MG EC tablet, Take 81 mg by mouth every other day. Reported on 10/16/2015, Disp: , Rfl:  .  Cholecalciferol (VITAMIN D3) 50000 units TABS, Take 50,000 Units once a week by mouth. Then take 50,000 monthly after 12 weeks., Disp: 51 tablet, Rfl: 0 .  esomeprazole (NEXIUM) 40 MG capsule, Take 1 capsule (40 mg total) by mouth daily at 12 noon., Disp: 90 capsule, Rfl: 3 .  fish oil-omega-3 fatty acids 1000 MG capsule, Take 2 g by mouth daily. , Disp: , Rfl:  .  glucosamine-chondroitin 500-400 MG tablet, Take 1 tablet by mouth daily. Reported on 09/11/2015, Disp: , Rfl:   Past Medical History: Past Medical History:  Diagnosis Date  . Arthritis   . Cancer ([place])    basal cell on right temple  . Hemorrhoids   . Lateral epicondylitis (tennis elbow)   . Non-small cell lung cancer (NSCLC) ([place]) dx'f 07/30/15  . Odynophagia 11/06/2015  . Spondylosis of cervical joint     Tobacco Use: Social History   Tobacco Use  Smoking Status Former Smoker  . Packs/day: 0.20  . Years: 62.00  . Pack years: 12.40  . Types: Cigarettes  . Last attempt to quit: 03/10/2011  . Years since quitting: 6.3  Smokeless Tobacco Never Used  Tobacco Comment   smokes 1 ciagettes occasionally    Labs: Recent Review Flowsheet Data    Labs for ITP Cardiac and Pulmonary Rehab Latest Ref Rng & Units 08/21/2011 10/04/2012 10/07/2013  10/20/2014 03/02/2017   Cholestrol 0 - 200 mg/dL 201(H) 180 180 192 177   LDLCALC 0 - 99 mg/dL - 115(H) 116(H) 110(H) 112(H)   LDLDIRECT mg/dL 118.5 - - - -   HDL >39.00 mg/dL 54.10 52.70 50.80 61.60 46.40   Trlycerides 0.0 - 149.0 mg/dL 91.0 63.0 67.0 104.0 90.0      Capillary Blood Glucose: Lab Results  Component Value Date   GLUCAP 97 12/30/2016   GLUCAP 103 (H) 09/05/2015     Pulmonary Assessment Scores: Pulmonary Assessment Scores    Row Name 04/23/17 0721         ADL UCSD   ADL Phase  Entry       mMRC Score   mMRC Score  1        Pulmonary Function Assessment: Pulmonary Function Assessment - 04/20/17 1021      Breath   Bilateral Breath Sounds  Clear    Shortness of Breath  Yes;Limiting activity       Exercise Target Goals:    Exercise Program Goal: Individual exercise prescription set using results from initial 6 min walk test and THRR while considering  patient's activity barriers and safety.    Exercise Prescription Goal: Initial exercise prescription builds to 30-45 minutes a day of aerobic activity, 2-3 days per week.  Home exercise guidelines will be given to patient during program as part of exercise  prescription that the participant will acknowledge.  Activity Barriers & Risk Stratification: Activity Barriers & Cardiac Risk Stratification - 04/20/17 1024      Activity Barriers & Cardiac Risk Stratification   Activity Barriers  None       6 Minute Walk: 6 Minute Walk    Row Name 04/23/17 0716         6 Minute Walk   Phase  Initial     Distance  1342 feet     Walk Time  6 minutes     # of Rest Breaks  0     MPH  2.54     METS  2.91     RPE  12     Perceived Dyspnea   2     Symptoms  No     Resting HR  89 bpm     Resting BP  152/81     Resting Oxygen Saturation   94 %     Exercise Oxygen Saturation  during 6 min walk  90 %     Max Ex. HR  111 bpm     Max Ex. BP  178/82     2 Minute Post BP  148/88       Interval HR   1 Minute  HR  99     2 Minute HR  105     3 Minute HR  108     4 Minute HR  111     5 Minute HR  112     6 Minute HR  113     2 Minute Post HR  106     Interval Heart Rate?  Yes       Interval Oxygen   Interval Oxygen?  Yes     Baseline Oxygen Saturation %  94 %     1 Minute Oxygen Saturation %  95 %     1 Minute Liters of Oxygen  0 L     2 Minute Oxygen Saturation %  95 %     2 Minute Liters of Oxygen  0 L     3 Minute Oxygen Saturation %  92 %     3 Minute Liters of Oxygen  0 L     4 Minute Oxygen Saturation %  91 %     4 Minute Liters of Oxygen  0 L     5 Minute Oxygen Saturation %  90 %     5 Minute Liters of Oxygen  0 L     6 Minute Oxygen Saturation %  90 %     6 Minute Liters of Oxygen  0 L     2 Minute Post Oxygen Saturation %  92 %     2 Minute Post Liters of Oxygen  0 L        Oxygen Initial Assessment: Oxygen Initial Assessment - 04/23/17 0716      Initial 6 min Walk   Oxygen Used  None      Program Oxygen Prescription   Program Oxygen Prescription  None       Oxygen Re-Evaluation: Oxygen Re-Evaluation    Row Name 06/04/17 0818 06/29/17 1604           Program Oxygen Prescription   Program Oxygen Prescription  None  None        Home Oxygen   Home Oxygen Device  None  None      Sleep Oxygen Prescription  None  None      Home Exercise Oxygen Prescription  None  None      Home at Rest Exercise Oxygen Prescription  None  None         Oxygen Discharge (Final Oxygen Re-Evaluation): Oxygen Re-Evaluation - 06/29/17 1604      Program Oxygen Prescription   Program Oxygen Prescription  None      Home Oxygen   Home Oxygen Device  None    Sleep Oxygen Prescription  None    Home Exercise Oxygen Prescription  None    Home at Rest Exercise Oxygen Prescription  None       Initial Exercise Prescription: Initial Exercise Prescription - 04/23/17 0700      Date of Initial Exercise RX and Referring Provider   Date  04/23/17    Referring Provider  Dr. Chase Caller       Treadmill   MPH  1.7    Grade  0    Minutes  17      NuStep   Level  2    SPM  80    Minutes  17    METs  1.5      Rower   Level  1    Watts  20    Minutes  17      Prescription Details   Frequency (times per week)  2    Duration  Progress to 45 minutes of aerobic exercise without signs/symptoms of physical distress      Intensity   THRR 40-80% of Max Heartrate  55-110    Ratings of Perceived Exertion  11-13    Perceived Dyspnea  0-4      Progression   Progression  Continue progressive overload as per policy without signs/symptoms or physical distress.      Resistance Training   Training Prescription  Yes    Weight  blue bands    Reps  10-15       Perform Capillary Blood Glucose checks as needed.  Exercise Prescription Changes: Exercise Prescription Changes    Row Name 04/28/17 1216 05/12/17 1200 05/14/17 1200 05/19/17 1200 05/28/17 1313     Response to Exercise   Blood Pressure (Admit)  100/60  122/70  140/84  108/72  116/70   Blood Pressure (Exercise)  130/74  152/72  138/70  120/74  140/68   Blood Pressure (Exit)  110/65  102/60  110/78  110/64  128/80   Heart Rate (Admit)  86 bpm  89 bpm  90 bpm  95 bpm  86 bpm   Heart Rate (Exercise)  113 bpm  100 bpm  107 bpm  93 bpm  102 bpm   Heart Rate (Exit)  93 bpm  91 bpm  88 bpm  95 bpm  96 bpm   Oxygen Saturation (Admit)  95 %  95 %  96 %  95 %  96 %   Oxygen Saturation (Exercise)  91 %  94 %  93 %  93 %  93 %   Oxygen Saturation (Exit)  95 %  95 %  97 %  95 %  96 %   Rating of Perceived Exertion (Exercise)  _0 Perceived Dyspnea (Exercise)  _1 Duration  Progress to 45 minutes of aerobic exercise without signs/symptoms of physical distress  Progress to 45 minutes of aerobic exercise without signs/symptoms of physical distress  Progress to 45 minutes of aerobic exercise without signs/symptoms of physical distress  Progress to 45 minutes of aerobic exercise without signs/symptoms of  physical distress  Progress to 45 minutes of aerobic exercise without signs/symptoms of physical distress   Intensity  Other (comment) HRR 40-80%  Other (comment) HRR 40-80%  Other (comment) HRR 40-80%  Other (comment) HRR 40-80%  Other (comment) HRR 40-80%     Resistance Training   Training Prescription  Yes  Yes  Yes  Yes  Yes   Weight  blue bands  blue bands  blue bands  blue bands  blue bands   Reps  10-15  10-15  10-15  10-15  10-15   Time  -  -  -  -  10 Minutes     Treadmill   MPH  1.7  -  -  -  -   Grade  0  -  -  -  -   Minutes  17  -  -  -  -     NuStep   Level  2  3  -  4  4   SPM  80  80  -  80  80   Minutes  17  17  -  17  17   METs  2.2  2.2  -  2.1  1.3     Rower   Level  _0 -   Watts  20  -  -  -  -   Minutes  _1 -     Track   Laps  -  _2 Minutes  -  _3 Row Name 06/04/17 1200 06/11/17 1329 06/16/17 1200 06/18/17 1244 06/23/17 1200     Response to Exercise   Blood Pressure (Admit)  110/62  103/58  108/60  104/64  108/62   Blood Pressure (Exercise)  130/70  130/64  136/70  118/66  120/70   Blood Pressure (Exit)  108/70  110/66  110/72  118/70  108/64   Heart Rate (Admit)  67 bpm  77 bpm  79 bpm  86 bpm  68 bpm   Heart Rate (Exercise)  107 bpm  98 bpm  108 bpm  106 bpm  102 bpm   Heart Rate (Exit)  91 bpm  86 bpm  80 bpm  96 bpm  83 bpm   Oxygen Saturation (Admit)  97 %  96 %  95 %  96 %  96 %   Oxygen Saturation (Exercise)  94 %  94 %  91 %  92 %  94 %   Oxygen Saturation (Exit)  96 %  97 %  95 %  96 %  95 %   Rating of Perceived Exertion (Exercise)  _4 Perceived Dyspnea (Exercise)  _5 0   Duration  Progress to 45 minutes of aerobic exercise without signs/symptoms of physical distress  Progress to 45 minutes of aerobic exercise without signs/symptoms of physical distress  Progress to 45 minutes of aerobic exercise without signs/symptoms of physical distress  Progress to 45 minutes of  aerobic exercise without signs/symptoms of physical distress  Progress to 45 minutes of aerobic exercise without signs/symptoms of physical distress   Intensity  THRR  unchanged  THRR unchanged  THRR unchanged  THRR unchanged  THRR unchanged     Progression   Progression  Continue to progress workloads to maintain intensity without signs/symptoms of physical distress.  Continue to progress workloads to maintain intensity without signs/symptoms of physical distress.  Continue to progress workloads to maintain intensity without signs/symptoms of physical distress.  Continue to progress workloads to maintain intensity without signs/symptoms of physical distress.  Continue to progress workloads to maintain intensity without signs/symptoms of physical distress.     Resistance Training   Training Prescription  Yes  Yes  Yes  Yes  Yes   Weight  blue bands  blue bands  blue bands  blue bands  blue bands   Reps  10-15  10-15  10-15  10-15  10-15   Time  10 Minutes  10 Minutes  10 Minutes  10 Minutes  10 Minutes     Bike   Level  _0 Minutes  _1 NuStep   Level  _2 -  5   Minutes  _3 -  17   METs  2.7  2.2  2.2  -  2.5     Track   Laps  17  -  _4 Minutes  17  -  _5 Row Name 06/25/17 1200             Response to Exercise   Blood Pressure (Admit)  100/66       Blood Pressure (Exercise)  130/80       Blood Pressure (Exit)  116/60       Heart Rate (Admit)  75 bpm       Heart Rate (Exercise)  94 bpm       Heart Rate (Exit)  82 bpm       Oxygen Saturation (Admit)  96 %       Oxygen Saturation (Exercise)  94 %       Oxygen Saturation (Exit)  96 %       Rating of Perceived Exertion (Exercise)  9       Perceived Dyspnea (Exercise)  0       Duration  Progress to 45 minutes of aerobic exercise without signs/symptoms of physical distress       Intensity  THRR unchanged         Progression   Progression  Continue to progress  workloads to maintain intensity without signs/symptoms of physical distress.         Resistance Training   Training Prescription  Yes       Weight  blue bands       Reps  10-15       Time  10 Minutes         Bike   Level  1       Minutes  17         NuStep   Level  6       Minutes  17       METs  2.5         Home Exercise Plan   Plans to continue exercise at  Home (comment)       Frequency  Add 3 additional days to program exercise sessions.  Exercise Comments: Exercise Comments    Row Name 06/25/17 1259           Exercise Comments  Home exercise completed          Exercise Goals and Review:   Exercise Goals Re-Evaluation : Exercise Goals Re-Evaluation    Row Name 05/11/17 1628 05/28/17 0736 06/29/17 1604         Exercise Goal Re-Evaluation   Exercise Goals Review  Increase Strength and Stamina;Increase Physical Activity;Able to understand and use Dyspnea scale;Able to understand and use rate of perceived exertion (RPE) scale;Knowledge and understanding of Target Heart Rate Range (THRR);Understanding of Exercise Prescription  Increase Strength and Stamina;Increase Physical Activity;Able to understand and use Dyspnea scale;Able to understand and use rate of perceived exertion (RPE) scale;Knowledge and understanding of Target Heart Rate Range (THRR);Understanding of Exercise Prescription  Increase Strength and Stamina;Increase Physical Activity;Able to understand and use Dyspnea scale;Able to understand and use rate of perceived exertion (RPE) scale;Knowledge and understanding of Target Heart Rate Range (THRR);Understanding of Exercise Prescription     Comments  Patient has only attended one exercise session. Has been out due to illness. Will cont. to progress as able.   Patient has only attended 4 rehab sesisons. Has been out of town for the holidays. Will cont. to monitor and progress as able.   Patient is progressing well in program. Home exercise completed. Is  able to walk 21 laps (200 ft. each) in 15 minutes.      Expected Outcomes  Through exercise at rehab and at home, patient will increase strength and stamina making ADL's easier to perform. Patient will also have a better understanding of safe exercise and what they are capable to do outside of clinical supervision.  Through exercise at rehab and at home, patient will increase strength and stamina making ADL's easier to perform. Patient will also have a better understanding of safe exercise and what they are capable to do outside of clinical supervision.  Through exercise at rehab and at home, patient will increase strength and stamina making ADL's easier to perform. Patient will also have a better understanding of safe exercise and what they are capable to do outside of clinical supervision.        Discharge Exercise Prescription (Final Exercise Prescription Changes): Exercise Prescription Changes - 06/25/17 1200      Response to Exercise   Blood Pressure (Admit)  100/66    Blood Pressure (Exercise)  130/80    Blood Pressure (Exit)  116/60    Heart Rate (Admit)  75 bpm    Heart Rate (Exercise)  94 bpm    Heart Rate (Exit)  82 bpm    Oxygen Saturation (Admit)  96 %    Oxygen Saturation (Exercise)  94 %    Oxygen Saturation (Exit)  96 %    Rating of Perceived Exertion (Exercise)  9    Perceived Dyspnea (Exercise)  0    Duration  Progress to 45 minutes of aerobic exercise without signs/symptoms of physical distress    Intensity  THRR unchanged      Progression   Progression  Continue to progress workloads to maintain intensity without signs/symptoms of physical distress.      Resistance Training   Training Prescription  Yes    Weight  blue bands    Reps  10-15    Time  10 Minutes      Bike   Level  1    Minutes  17  NuStep   Level  6    Minutes  17    METs  2.5      Home Exercise Plan   Plans to continue exercise at  Home (comment)    Frequency  Add 3 additional days to  program exercise sessions.       Nutrition:  Target Goals: Understanding of nutrition guidelines, daily intake of sodium <1547m, cholesterol <2025m calories 30% from fat and 7% or less from saturated fats, daily to have 5 or more servings of fruits and vegetables.  Biometrics: Pre Biometrics - 04/20/17 1024      Pre Biometrics   Grip Strength  28 kg        Nutrition Therapy Plan and Nutrition Goals: Nutrition Therapy & Goals - 05/08/17 0934      Nutrition Therapy   Diet  General, healthful      Personal Nutrition Goals   Nutrition Goal  Identify food quantities necessary to achieve desired wt loss at graduation from pulmonary rehab.      Intervention Plan   Intervention  Prescribe, educate and counsel regarding individualized specific dietary modifications aiming towards targeted core components such as weight, hypertension, lipid management, diabetes, heart failure and other comorbidities.    Expected Outcomes  Short Term Goal: Understand basic principles of dietary content, such as calories, fat, sodium, cholesterol and nutrients.;Long Term Goal: Adherence to prescribed nutrition plan.       Nutrition Assessments: Nutrition Assessments - 05/08/17 0930      Rate Your Plate Scores   Pre Score  58       Nutrition Goals Re-Evaluation:   Nutrition Goals Discharge (Final Nutrition Goals Re-Evaluation):   Psychosocial: Target Goals: Acknowledge presence or absence of significant depression and/or stress, maximize coping skills, provide positive support system. Participant is able to verbalize types and ability to use techniques and skills needed for reducing stress and depression.  Initial Review & Psychosocial Screening: Initial Psych Review & Screening - 04/20/17 1028      Initial Review   Current issues with  None Identified      Family Dynamics   Good Support System?  Yes      Barriers   Psychosocial barriers to participate in program  There are no  identifiable barriers or psychosocial needs.       Quality of Life Scores:  Scores of 19 and below usually indicate a poorer quality of life in these areas.  A difference of  2-3 points is a clinically meaningful difference.  A difference of 2-3 points in the total score of the Quality of Life Index has been associated with significant improvement in overall quality of life, self-image, physical symptoms, and general health in studies assessing change in quality of life.   PHQ-9: Recent Review Flowsheet Data    [facility] 04/20[facility]2017 10/30/2015 10/20/2014 10/14/2013 10/14/2013   Decreased Interest - 0 0 0 0   Down, Depressed, Hopeless 0 0 0 0 0   PHQ - 2 Score 0 0 0 0 0     Interpretation of Total Score  Total Score Depression Severity:  1-4 = Minimal depression, 5-9 = Mild depression, 10-14 = Moderate depression, 15-19 = Moderately severe depression, 20-27 = Severe depression   Psychosocial Evaluation and Intervention: Psychosocial Evaluation - 04/20/17 1029      Psychosocial Evaluation & Interventions   Interventions  Encouraged to exercise with the program and follow exercise prescription    Continue Psychosocial Services   No  Follow up required       Psychosocial Re-Evaluation: Psychosocial Re-Evaluation    [place] Name 05/12/17 1417 06/04/17 0820 06/29/17 1304         Psychosocial Re-Evaluation   Current issues with  None Identified  None Identified  None Identified     Expected Outcomes  patient will remain free from psychosocial barriers to participation in pulmonary rehab  patient will remain free from psychosocial barriers to participation in pulmonary rehab  patient will remain free from psychosocial barriers to participation in pulmonary rehab     Interventions  Encouraged to attend Pulmonary Rehabilitation for the exercise  Encouraged to attend Pulmonary Rehabilitation for the exercise  Encouraged to attend Pulmonary Rehabilitation for the exercise     Continue  Psychosocial Services   No Follow up required  No Follow up required  No Follow up required        Psychosocial Discharge (Final Psychosocial Re-Evaluation): Psychosocial Re-Evaluation - 06/29/17 1304      Psychosocial Re-Evaluation   Current issues with  None Identified    Expected Outcomes  patient will remain free from psychosocial barriers to participation in pulmonary rehab    Interventions  Encouraged to attend Pulmonary Rehabilitation for the exercise    Continue Psychosocial Services   No Follow up required       Education: Education Goals: Education classes will be provided on a weekly basis, covering required topics. Participant will state understanding/return demonstration of topics presented.  Learning Barriers/Preferences: Learning Barriers/Preferences - 04/20/17 1020      Learning Barriers/Preferences   Learning Barriers  None    Learning Preferences  Individual Instruction;Skilled Demonstration       Education Topics: Risk Factor Reduction:  -Group instruction that is supported by a PowerPoint presentation. Instructor discusses the definition of a risk factor, different risk factors for pulmonary disease, and how the heart and lungs work together.     Nutrition for Pulmonary Patient:  -Group instruction provided by PowerPoint slides, verbal discussion, and written materials to support subject matter. The instructor gives an explanation and review of healthy diet recommendations, which includes a discussion on weight management, recommendations for fruit and vegetable consumption, as well as protein, fluid, caffeine, fiber, sodium, sugar, and alcohol. Tips for eating when patients are short of breath are discussed.   PULMONARY REHAB OTHER RESPIRATORY from 06/25/2017 in [place]  Date  06/11/17  Educator  edna  Instruction Review Code  2- meets goals/outcomes      Pursed Lip Breathing:  -Group instruction that is supported by  demonstration and informational handouts. Instructor discusses the benefits of pursed lip and diaphragmatic breathing and detailed demonstration on how to preform both.     Oxygen Safety:  -Group instruction provided by PowerPoint, verbal discussion, and written material to support subject matter. There is an overview of "What is Oxygen" and "Why do we need it".  Instructor also reviews how to create a safe environment for oxygen use, the importance of using oxygen as prescribed, and the risks of noncompliance. There is a brief discussion on traveling with oxygen and resources the patient may utilize.   Oxygen Equipment:  -Group instruction provided by [facility] Staff utilizing handouts, written materials, and equipment demonstrations.   Signs and Symptoms:  -Group instruction provided by written material and verbal discussion to support subject matter. Warning signs and symptoms of infection, stroke, and heart attack are reviewed and when to call the physician/911 reinforced. Tips for preventing  the spread of infection discussed.   Advanced Directives:  -Group instruction provided by verbal instruction and written material to support subject matter. Instructor reviews Advanced Directive laws and proper instruction for filling out document.   Pulmonary Video:  -Group video education that reviews the importance of medication and oxygen compliance, exercise, good nutrition, pulmonary hygiene, and pursed lip and diaphragmatic breathing for the pulmonary patient.   Exercise for the Pulmonary Patient:  -Group instruction that is supported by a PowerPoint presentation. Instructor discusses benefits of exercise, core components of exercise, frequency, duration, and intensity of an exercise routine, importance of utilizing pulse oximetry during exercise, safety while exercising, and options of places to exercise outside of rehab.     Pulmonary Medications:  -Verbally interactive group education  provided by instructor with focus on inhaled medications and proper administration.   PULMONARY REHAB OTHER RESPIRATORY from 06/25/2017 in [place]  Date  06/25/17  Educator  PharmD  Instruction Review Code  2- meets goals/outcomes      Anatomy and Physiology of the Respiratory System and Intimacy:  -Group instruction provided by PowerPoint, verbal discussion, and written material to support subject matter. Instructor reviews respiratory cycle and anatomical components of the respiratory system and their functions. Instructor also reviews differences in obstructive and restrictive respiratory diseases with examples of each. Intimacy, Sex, and Sexuality differences are reviewed with a discussion on how relationships can change when diagnosed with pulmonary disease. Common sexual concerns are reviewed.   PULMONARY REHAB OTHER RESPIRATORY from 06/25/2017 in [place]  Date  05/28/17  Educator  RN  Instruction Review Code  2- meets goals/outcomes      MD DAY -A group question and answer session with a medical doctor that allows participants to ask questions that relate to their pulmonary disease state.   PULMONARY REHAB OTHER RESPIRATORY from 06/25/2017 in [place]  Date  06/18/17  Educator  Dr. Nelda Marseille  Instruction Review Code  2- meets goals/outcomes      OTHER EDUCATION -Group or individual verbal, written, or video instructions that support the educational goals of the pulmonary rehab program.   Knowledge Questionnaire Score:   Core Components/Risk Factors/Patient Goals at Admission: Personal Goals and Risk Factors at Admission - 04/20/17 1027      Core Components/Risk Factors/Patient Goals on Admission   Improve shortness of breath with ADL's  Yes    Intervention  Provide education, individualized exercise plan and daily activity instruction to help decrease symptoms of SOB with  activities of daily living.    Expected Outcomes  Short Term: Achieves a reduction of symptoms when performing activities of daily living.    Develop more efficient breathing techniques such as purse lipped breathing and diaphragmatic breathing; and practicing self-pacing with activity  Yes    Intervention  Provide education, demonstration and support about specific breathing techniuqes utilized for more efficient breathing. Include techniques such as pursed lipped breathing, diaphragmatic breathing and self-pacing activity.    Expected Outcomes  Short Term: Participant will be able to demonstrate and use breathing techniques as needed throughout daily activities.    Increase knowledge of respiratory medications and ability to use respiratory devices properly   Yes    Intervention  Provide education and demonstration as needed of appropriate use of medications, inhalers, and oxygen therapy.    Expected Outcomes  Short Term: Achieves understanding of medications use. Understands that oxygen is a medication prescribed by physician.  Demonstrates appropriate use of inhaler and oxygen therapy.       Core Components/Risk Factors/Patient Goals Review:  Goals and Risk Factor Review    Row Name 05/12/17 1415 06/04/17 0819 06/29/17 1302         Core Components/Risk Factors/Patient Goals Review   Personal Goals Review  Improve shortness of breath with ADL's;Increase knowledge of respiratory medications and ability to use respiratory devices properly.;Develop more efficient breathing techniques such as purse lipped breathing and diaphragmatic breathing and practicing self-pacing with activity.  Improve shortness of breath with ADL's;Increase knowledge of respiratory medications and ability to use respiratory devices properly.;Develop more efficient breathing techniques such as purse lipped breathing and diaphragmatic breathing and practicing self-pacing with activity.  Improve shortness of breath with  ADL's;Increase knowledge of respiratory medications and ability to use respiratory devices properly.;Develop more efficient breathing techniques such as purse lipped breathing and diaphragmatic breathing and practicing self-pacing with activity.     Review  patient is doing well in pulmonary rehab. He seems to enjoy his sessions and engages in conversation with the staff and classmates when here. He missed both exercise sessions last week related to a head cold. He has only attended 2 exercise sessions since admission and it is too soon to evaluate progress towards goals.  Patient is doing great in pulmonary rehab. He is tolerating workload increases and states he is seeing an improvement in his shortness of breath at home with activities. He is utilizing PLB when prompted.  Patient continues to do great in pulmonary rehab. He is tolerating workload increases and rates his exertion 11 or less. He states his shortness of breath has significantly improved and his tolerance for exertions has as well. He recently attended an education session on pulmonary medications taught by a Pharm D. Expect to continue to see improvements over the next 30 days.     Expected Outcomes  see admission outcomes  see admission outcomes  see admission outcomes        Core Components/Risk Factors/Patient Goals at Discharge (Final Review):  Goals and Risk Factor Review - 06/29/17 1302      Core Components/Risk Factors/Patient Goals Review   Personal Goals Review  Improve shortness of breath with ADL's;Increase knowledge of respiratory medications and ability to use respiratory devices properly.;Develop more efficient breathing techniques such as purse lipped breathing and diaphragmatic breathing and practicing self-pacing with activity.    Review  Patient continues to do great in pulmonary rehab. He is tolerating workload increases and rates his exertion 11 or less. He states his shortness of breath has significantly improved and  his tolerance for exertions has as well. He recently attended an education session on pulmonary medications taught by a Pharm D. Expect to continue to see improvements over the next 30 days.    Expected Outcomes  see admission outcomes       ITP Comments:   Comments: Patient has attended 16 pulmonary rehab sessions since admission.

## 2017-06-30 NOTE — Progress Notes (Signed)
Daily Session Note  Patient Details  Name: Jaquavian Firkus MRN: [id number] Date of Birth: 05/12/1932 Referring Provider:     Pulmonary Rehab Walk Test from 04/21/2017 in [place]  Referring Provider  Dr. Chase Caller      Encounter Date: 06/30/2017  Check In: Session Check In - 06/30/17 1011      Check-In   Location  [facility]    Staff Present  Su Hilt, MS, ACSM RCEP, Exercise Physiologist;Lisa Ysidro E[place], RN;Yahye Siebert [place], RN, Maxcine Ham, RN, BSN    Supervising physician immediately available to respond to emergencies  [facility] immediately available    Physician(s)  Dr. Zigmund Daniel    Medication changes reported      No    Fall or balance concerns reported     No    Tobacco Cessation  No Change    Warm-up and Cool-down  Performed as group-led instruction    Resistance Training Performed  Yes    VAD Patient?  No      Pain Assessment   Currently in Pain?  No/denies    Multiple Pain Sites  No       Capillary Blood Glucose: No results found for this or any previous visit (from the past 24 hour(s)).  Exercise Prescription Changes - 06/30/17 1245      Response to Exercise   Blood Pressure (Admit)  120/72    Blood Pressure (Exercise)  120/60    Blood Pressure (Exit)  100/60    Heart Rate (Admit)  73 bpm    Heart Rate (Exercise)  108 bpm    Heart Rate (Exit)  93 bpm    Oxygen Saturation (Admit)  96 %    Oxygen Saturation (Exercise)  92 %    Oxygen Saturation (Exit)  94 %    Rating of Perceived Exertion (Exercise)  8    Perceived Dyspnea (Exercise)  1    Duration  Progress to 45 minutes of aerobic exercise without signs/symptoms of physical distress    Intensity  THRR unchanged      Progression   Progression  Continue to progress workloads to maintain intensity without signs/symptoms of physical distress.      Resistance Training   Training Prescription  Yes    Weight  blue bands    Reps  10-15    Time   10 Minutes      Bike   Level  1    Minutes  17      NuStep   Level  4    Minutes  17    METs  1.8      Track   Laps  27    Minutes  17      Home Exercise Plan   Plans to continue exercise at  Home (comment)    Frequency  Add 3 additional days to program exercise sessions.       Social History   Tobacco Use  Smoking Status Former Smoker  . Packs/day: 0.20  . Years: 62.00  . Pack years: 12.40  . Types: Cigarettes  . Last attempt to quit: 03/10/2011  . Years since quitting: 6.3  Smokeless Tobacco Never Used  Tobacco Comment   smokes 1 ciagettes occasionally    Goals Met:  Independence with exercise equipment Improved SOB with ADL's Using PLB without cueing & demonstrates good technique Exercise tolerated well No report of cardiac concerns or symptoms Strength training completed today  Goals Unmet:  Not  Applicable  Comments: Service time is from 1030 to 1205   Dr. Rush Farmer is Medical Director for Pulmonary Rehab at [facility].

## 2017-07-02 ENCOUNTER — Encounter (HOSPITAL_COMMUNITY)
Admission: RE | Admit: 2017-07-02 | Discharge: 2017-07-02 | Disposition: A | Discharge status: Home / Self Care | Payer: Medicare Other | Source: Ambulatory Visit | Attending: Internal Medicine | Admitting: Internal Medicine

## 2017-07-02 VITALS — Wt 173.9 lb

## 2017-07-02 DIAGNOSIS — J439 Emphysema, unspecified: Secondary | ICD-10-CM

## 2017-07-02 NOTE — Progress Notes (Signed)
Daily Session Note  Patient Details  Name: Jason Summers MRN: [id number] Date of Birth: 1931-11-28 Referring Provider:     Pulmonary Rehab Walk Test from 04/21/2017 in [place]  Referring Provider  Dr. Chase Caller      Encounter Date: 07/02/2017  Check In: Session Check In - 07/02/17 1030      Check-In   Location  [facility]    Staff Present  Su Hilt, MS, ACSM RCEP, Exercise Physiologist;Aiza Vollrath Rollene Rotunda, RN, Maxcine Ham, RN, BSN    Supervising physician immediately available to respond to emergencies  [facility] immediately available    Physician(s)  Dr. Bonner Puna    Medication changes reported      No    Fall or balance concerns reported     No    Tobacco Cessation  No Change    Warm-up and Cool-down  Performed as group-led instruction    Resistance Training Performed  Yes    VAD Patient?  No      Pain Assessment   Currently in Pain?  No/denies    Multiple Pain Sites  No       Capillary Blood Glucose: No results found for this or any previous visit (from the past 24 hour(s)).  Exercise Prescription Changes - 07/02/17 1238      Response to Exercise   Blood Pressure (Admit)  102/60    Blood Pressure (Exercise)  120/60    Blood Pressure (Exit)  102/60    Heart Rate (Admit)  80 bpm    Heart Rate (Exercise)  98 bpm    Heart Rate (Exit)  84 bpm    Oxygen Saturation (Admit)  96 %    Oxygen Saturation (Exercise)  90 %    Oxygen Saturation (Exit)  93 %    Rating of Perceived Exertion (Exercise)  8    Perceived Dyspnea (Exercise)  2    Duration  Progress to 45 minutes of aerobic exercise without signs/symptoms of physical distress    Intensity  THRR unchanged      Progression   Progression  Continue to progress workloads to maintain intensity without signs/symptoms of physical distress.      Resistance Training   Training Prescription  Yes    Weight  blue bands    Reps  10-15    Time  10 Minutes      Bike   Level  1    Minutes  17      NuStep   Level  6    Minutes  17    METs  2.6      Track   Laps  27    Minutes  17      Home Exercise Plan   Plans to continue exercise at  Home (comment)    Frequency  Add 3 additional days to program exercise sessions.       Social History   Tobacco Use  Smoking Status Former Smoker  . Packs/day: 0.20  . Years: 62.00  . Pack years: 12.40  . Types: Cigarettes  . Last attempt to quit: 03/10/2011  . Years since quitting: 6.3  Smokeless Tobacco Never Used  Tobacco Comment   smokes 1 ciagettes occasionally    Goals Met:  Independence with exercise equipment Improved SOB with ADL's Using PLB without cueing & demonstrates good technique Exercise tolerated well No report of cardiac concerns or symptoms Strength training completed today  Goals Unmet:  Not Applicable  Comments:  Service time is from 1030 to 1210   Dr. Rush Farmer is Medical Director for Pulmonary Rehab at [facility].

## 2017-07-03 NOTE — Telephone Encounter (Signed)
Spoke with patient. He is aware of results. Nothing else needed at time of call.

## 2017-07-03 NOTE — Telephone Encounter (Signed)
Pt is calling about results for his Vit D    [phone]

## 2017-07-07 ENCOUNTER — Encounter (HOSPITAL_COMMUNITY)
Admission: RE | Admit: 2017-07-07 | Discharge: 2017-07-07 | Disposition: A | Discharge status: Home / Self Care | Payer: Medicare Other | Source: Ambulatory Visit | Attending: Internal Medicine | Admitting: Internal Medicine

## 2017-07-07 VITALS — Wt 173.7 lb

## 2017-07-07 DIAGNOSIS — J439 Emphysema, unspecified: Secondary | ICD-10-CM | POA: Diagnosis not present

## 2017-07-07 NOTE — Progress Notes (Signed)
Daily Session Note  Patient Details  Name: Jason Summers MRN: [id number] Date of Birth: 10/25/1931 Referring Provider:     Pulmonary Rehab Walk Test from 04/21/2017 in [place]  Referring Provider  Dr. Chase Caller      Encounter Date: 07/07/2017  Check In: Session Check In - 07/07/17 1217      Check-In   Location  [facility]    Staff Present  Su Hilt, MS, ACSM RCEP, Exercise Physiologist;Portia Rollene Rotunda, RN, Maxcine Ham, RN, Roque Cash, RN    Supervising physician immediately available to respond to emergencies  [facility] immediately available    Physician(s)  Dr. Bonner Puna    Medication changes reported      No    Fall or balance concerns reported     No    Tobacco Cessation  No Change    Warm-up and Cool-down  Performed as group-led instruction    Resistance Training Performed  Yes    VAD Patient?  No      Pain Assessment   Currently in Pain?  No/denies    Multiple Pain Sites  No       Capillary Blood Glucose: No results found for this or any previous visit (from the past 24 hour(s)).  Exercise Prescription Changes - 07/07/17 1200      Response to Exercise   Blood Pressure (Admit)  108/60    Blood Pressure (Exercise)  120/80    Blood Pressure (Exit)  110/60    Heart Rate (Admit)  57 bpm    Heart Rate (Exercise)  98 bpm    Heart Rate (Exit)  87 bpm    Oxygen Saturation (Admit)  96 %    Oxygen Saturation (Exercise)  93 %    Oxygen Saturation (Exit)  95 %    Rating of Perceived Exertion (Exercise)  8    Perceived Dyspnea (Exercise)  2    Duration  Progress to 45 minutes of aerobic exercise without signs/symptoms of physical distress    Intensity  THRR unchanged      Progression   Progression  Continue to progress workloads to maintain intensity without signs/symptoms of physical distress.      Resistance Training   Training Prescription  Yes    Weight  blue bands    Reps  10-15    Time  10  Minutes      Bike   Level  1    Minutes  17      NuStep   Level  5    Minutes  17    METs  2.5      Track   Laps  20    Minutes  17       Social History   Tobacco Use  Smoking Status Former Smoker  . Packs/day: 0.20  . Years: 62.00  . Pack years: 12.40  . Types: Cigarettes  . Last attempt to quit: 03/10/2011  . Years since quitting: 6.3  Smokeless Tobacco Never Used  Tobacco Comment   smokes 1 ciagettes occasionally    Goals Met:  Exercise tolerated well No report of cardiac concerns or symptoms Strength training completed today  Goals Unmet:  Not Applicable  Comments: Service time is from 1030 to 1205    Dr. Rush Farmer is Medical Director for Pulmonary Rehab at [facility].

## 2017-07-09 ENCOUNTER — Encounter (HOSPITAL_COMMUNITY)
Admission: RE | Admit: 2017-07-09 | Discharge: 2017-07-09 | Disposition: A | Discharge status: Home / Self Care | Payer: Medicare Other | Source: Ambulatory Visit | Attending: Internal Medicine | Admitting: Internal Medicine

## 2017-07-09 DIAGNOSIS — J439 Emphysema, unspecified: Secondary | ICD-10-CM

## 2017-07-09 NOTE — Progress Notes (Addendum)
Daily Session Note  Patient Details  Name: Jason Summers MRN: [id number] Date of Birth: [birth year] Referring Provider:     Pulmonary Rehab Walk Test from 04/21/2017 in [place]  Referring Provider  Dr. Chase Caller      Encounter Date: 07/09/2017  Check In: Session Check In - 07/09/17 1107      Check-In   Location  [facility]    Staff Present  Su Hilt, MS, ACSM RCEP, Exercise Physiologist;Portia Rollene Rotunda, RN, Maxcine Ham, RN, Roque Cash, RN    Supervising physician immediately available to respond to emergencies  [facility] immediately available    Physician(s)   Dr. Tyrell Antonio    Medication changes reported      No    Fall or balance concerns reported     No    Tobacco Cessation  No Change    Warm-up and Cool-down  Performed as group-led instruction    Resistance Training Performed  Yes    VAD Patient?  No      Pain Assessment   Currently in Pain?  No/denies    Multiple Pain Sites  No       Capillary Blood Glucose: No results found for this or any previous visit (from the past 24 hour(s)).    Social History   Tobacco Use  Smoking Status Former Smoker  . Packs/day: 0.20  . Years: 62.00  . Pack years: 12.40  . Types: Cigarettes  . Last attempt to quit: 03/10/2011  . Years since quitting: 6.3  Smokeless Tobacco Never Used  Tobacco Comment   smokes 1 ciagettes occasionally    Goals Met:  Exercise tolerated well No report of cardiac concerns or symptoms Strength training completed today  Goals Unmet:  Not Applicable  Comments: Service time is from 10:30a to 1:30p    Patient attended education class on Mental Health and Chronic Disease with Lucianne Lei   Dr. Rush Farmer is Medical Director for Pulmonary Rehab at [facility].

## 2017-07-14 ENCOUNTER — Encounter (HOSPITAL_COMMUNITY)
Admission: RE | Admit: 2017-07-14 | Discharge: 2017-07-14 | Disposition: A | Discharge status: Home / Self Care | Payer: Medicare Other | Source: Ambulatory Visit | Attending: Internal Medicine | Admitting: Internal Medicine

## 2017-07-14 VITALS — Wt 173.9 lb

## 2017-07-14 DIAGNOSIS — Z85828 Personal history of other malignant neoplasm of skin: Secondary | ICD-10-CM | POA: Insufficient documentation

## 2017-07-14 DIAGNOSIS — J439 Emphysema, unspecified: Secondary | ICD-10-CM | POA: Diagnosis present

## 2017-07-14 DIAGNOSIS — Z87891 Personal history of nicotine dependence: Secondary | ICD-10-CM | POA: Insufficient documentation

## 2017-07-14 DIAGNOSIS — Z79899 Other long term (current) drug therapy: Secondary | ICD-10-CM | POA: Diagnosis not present

## 2017-07-14 DIAGNOSIS — Z7982 Long term (current) use of aspirin: Secondary | ICD-10-CM | POA: Insufficient documentation

## 2017-07-14 DIAGNOSIS — M1990 Unspecified osteoarthritis, unspecified site: Secondary | ICD-10-CM | POA: Insufficient documentation

## 2017-07-14 NOTE — Progress Notes (Signed)
Daily Session Note  Patient Details  Name: Jason Summers MRN: [id number] Date of Birth: January 10, 1932 Referring Provider:     Pulmonary Rehab Walk Test from 04/21/2017 in [place]  Referring Provider  Dr. Chase Caller      Encounter Date: 07/14/2017  Check In: Session Check In - 07/14/17 1215      Check-In   Location  [facility]    Staff Present  Su Hilt, MS, ACSM RCEP, Exercise Physiologist;Portia Rollene Rotunda, RN, Maxcine Ham, RN, Roque Cash, RN    Supervising physician immediately available to respond to emergencies  [facility] immediately available    Physician(s)  Dr. Tyrell Antonio    Medication changes reported      No    Fall or balance concerns reported     No    Tobacco Cessation  No Change    Warm-up and Cool-down  Performed as group-led instruction    Resistance Training Performed  Yes    VAD Patient?  No      Pain Assessment   Currently in Pain?  No/denies    Multiple Pain Sites  No       Capillary Blood Glucose: No results found for this or any previous visit (from the past 24 hour(s)).  Exercise Prescription Changes - 07/14/17 1200      Response to Exercise   Blood Pressure (Admit)  122/70    Blood Pressure (Exercise)  140/80    Blood Pressure (Exit)  116/64    Heart Rate (Admit)  69 bpm    Heart Rate (Exercise)  106 bpm    Heart Rate (Exit)  90 bpm    Oxygen Saturation (Admit)  96 %    Oxygen Saturation (Exercise)  91 %    Oxygen Saturation (Exit)  96 %    Rating of Perceived Exertion (Exercise)  7    Perceived Dyspnea (Exercise)  1    Duration  Progress to 45 minutes of aerobic exercise without signs/symptoms of physical distress    Intensity  THRR unchanged      Progression   Progression  Continue to progress workloads to maintain intensity without signs/symptoms of physical distress.      Resistance Training   Training Prescription  Yes    Weight  blue bands    Reps  10-15    Time  10  Minutes      Bike   Level  1    Minutes  17      NuStep   Level  5    Minutes  17    METs  2.4      Track   Laps  21    Minutes  17       Social History   Tobacco Use  Smoking Status Former Smoker  . Packs/day: 0.20  . Years: 62.00  . Pack years: 12.40  . Types: Cigarettes  . Last attempt to quit: 03/10/2011  . Years since quitting: 6.3  Smokeless Tobacco Never Used  Tobacco Comment   smokes 1 ciagettes occasionally    Goals Met:  Exercise tolerated well No report of cardiac concerns or symptoms Strength training completed today  Goals Unmet:  Not Applicable  Comments: Service time is from 10:30a to 12:05p    Dr. Rush Farmer is Medical Director for Pulmonary Rehab at [facility].

## 2017-07-16 ENCOUNTER — Encounter (HOSPITAL_COMMUNITY)
Admission: RE | Admit: 2017-07-16 | Discharge: 2017-07-16 | Disposition: A | Discharge status: Home / Self Care | Payer: Medicare Other | Source: Ambulatory Visit | Attending: Internal Medicine | Admitting: Internal Medicine

## 2017-07-16 DIAGNOSIS — J439 Emphysema, unspecified: Secondary | ICD-10-CM

## 2017-07-16 NOTE — Progress Notes (Signed)
Daily Session Note  Patient Details  Name: Jason Summers MRN: [id number] Date of Birth: 30-Jul-1931 Referring Provider:     Pulmonary Rehab Walk Test from 04/21/2017 in [place]  Referring Provider  Dr. Chase Caller      Encounter Date: 07/16/2017  Check In: Session Check In - 07/16/17 1053      Check-In   Location  [facility]    Staff Present  Su Hilt, MS, ACSM RCEP, Exercise Physiologist;Portia Rollene Rotunda, RN, Maxcine Ham, RN, Roque Cash, RN    Supervising physician immediately available to respond to emergencies  [facility] immediately available       Capillary Blood Glucose: No results found for this or any previous visit (from the past 24 hour(s)).    Social History   Tobacco Use  Smoking Status Former Smoker  . Packs/day: 0.20  . Years: 62.00  . Pack years: 12.40  . Types: Cigarettes  . Last attempt to quit: 03/10/2011  . Years since quitting: 6.3  Smokeless Tobacco Never Used  Tobacco Comment   smokes 1 ciagettes occasionally    Goals Met:  Exercise tolerated well No report of cardiac concerns or symptoms Strength training completed today  Goals Unmet:  Not Applicable  Comments: Service time is from 1030 to 1225    Dr. Rush Farmer is Medical Director for Pulmonary Rehab at [facility].

## 2017-07-21 ENCOUNTER — Encounter (HOSPITAL_COMMUNITY)
Admission: RE | Admit: 2017-07-21 | Discharge: 2017-07-21 | Disposition: A | Discharge status: Home / Self Care | Payer: Medicare Other | Source: Ambulatory Visit | Attending: Internal Medicine | Admitting: Internal Medicine

## 2017-07-21 DIAGNOSIS — J439 Emphysema, unspecified: Secondary | ICD-10-CM | POA: Diagnosis not present

## 2017-07-21 NOTE — Progress Notes (Signed)
Pulmonary Individual Treatment Plan  Patient Details  Name: Jason Summers MRN: [id number] Date of Birth: May 10, 1932 Referring Provider:     Pulmonary Rehab Walk Test from 04/21/2017 in [place]  Referring Provider  Dr. Chase Caller      Initial Encounter Date:    Pulmonary Rehab Walk Test from 04/21/2017 in [place]  Date  04/23/17  Referring Provider  Dr. Chase Caller      Visit Diagnosis: Pulmonary emphysema, unspecified emphysema type ([place])  Patient's Home Medications on Admission:   Current Outpatient Medications:  .  aspirin 81 MG EC tablet, Take 81 mg by mouth every other day. Reported on 10/16/2015, Disp: , Rfl:  .  Cholecalciferol (VITAMIN D3) 50000 units TABS, Take 50,000 Units once a week by mouth. Then take 50,000 monthly after 12 weeks., Disp: 51 tablet, Rfl: 0 .  esomeprazole (NEXIUM) 40 MG capsule, Take 1 capsule (40 mg total) by mouth daily at 12 noon., Disp: 90 capsule, Rfl: 3 .  fish oil-omega-3 fatty acids 1000 MG capsule, Take 2 g by mouth daily. , Disp: , Rfl:  .  glucosamine-chondroitin 500-400 MG tablet, Take 1 tablet by mouth daily. Reported on 09/11/2015, Disp: , Rfl:   Past Medical History: Past Medical History:  Diagnosis Date  . Arthritis   . Cancer ([place])    basal cell on right temple  . Hemorrhoids   . Lateral epicondylitis (tennis elbow)   . Non-small cell lung cancer (NSCLC) ([place]) dx'f 07/30/15  . Odynophagia 11/06/2015  . Spondylosis of cervical joint     Tobacco Use: Social History   Tobacco Use  Smoking Status Former Smoker  . Packs/day: 0.20  . Years: 62.00  . Pack years: 12.40  . Types: Cigarettes  . Last attempt to quit: 03/10/2011  . Years since quitting: 6.3  Smokeless Tobacco Never Used  Tobacco Comment   smokes 1 ciagettes occasionally    Labs: Recent Review Flowsheet Data    Labs for ITP Cardiac and Pulmonary Rehab Latest Ref Rng & Units 08/21/2011 10/04/2012 10/07/2013  10/20/2014 03/02/2017   Cholestrol 0 - 200 mg/dL 201(H) 180 180 192 177   LDLCALC 0 - 99 mg/dL - 115(H) 116(H) 110(H) 112(H)   LDLDIRECT mg/dL 118.5 - - - -   HDL >39.00 mg/dL 54.10 52.70 50.80 61.60 46.40   Trlycerides 0.0 - 149.0 mg/dL 91.0 63.0 67.0 104.0 90.0      Capillary Blood Glucose: Lab Results  Component Value Date   GLUCAP 97 12/30/2016   GLUCAP 103 (H) 09/05/2015     Pulmonary Assessment Scores: Pulmonary Assessment Scores    Row Name 04/23/17 0721         ADL UCSD   ADL Phase  Entry       mMRC Score   mMRC Score  1        Pulmonary Function Assessment: Pulmonary Function Assessment - 04/20/17 1021      Breath   Bilateral Breath Sounds  Clear    Shortness of Breath  Yes;Limiting activity       Exercise Target Goals:    Exercise Program Goal: Individual exercise prescription set using results from initial 6 min walk test and THRR while considering  patient's activity barriers and safety.    Exercise Prescription Goal: Initial exercise prescription builds to 30-45 minutes a day of aerobic activity, 2-3 days per week.  Home exercise guidelines will be given to patient during program as part of exercise  prescription that the participant will acknowledge.  Activity Barriers & Risk Stratification: Activity Barriers & Cardiac Risk Stratification - 04/20/17 1024      Activity Barriers & Cardiac Risk Stratification   Activity Barriers  None       6 Minute Walk: 6 Minute Walk    Row Name 04/23/17 0716         6 Minute Walk   Phase  Initial     Distance  1342 feet     Walk Time  6 minutes     # of Rest Breaks  0     MPH  2.54     METS  2.91     RPE  12     Perceived Dyspnea   2     Symptoms  No     Resting HR  89 bpm     Resting BP  152/81     Resting Oxygen Saturation   94 %     Exercise Oxygen Saturation  during 6 min walk  90 %     Max Ex. HR  111 bpm     Max Ex. BP  178/82     2 Minute Post BP  148/88       Interval HR   1 Minute  HR  99     2 Minute HR  105     3 Minute HR  108     4 Minute HR  111     5 Minute HR  112     6 Minute HR  113     2 Minute Post HR  106     Interval Heart Rate?  Yes       Interval Oxygen   Interval Oxygen?  Yes     Baseline Oxygen Saturation %  94 %     1 Minute Oxygen Saturation %  95 %     1 Minute Liters of Oxygen  0 L     2 Minute Oxygen Saturation %  95 %     2 Minute Liters of Oxygen  0 L     3 Minute Oxygen Saturation %  92 %     3 Minute Liters of Oxygen  0 L     4 Minute Oxygen Saturation %  91 %     4 Minute Liters of Oxygen  0 L     5 Minute Oxygen Saturation %  90 %     5 Minute Liters of Oxygen  0 L     6 Minute Oxygen Saturation %  90 %     6 Minute Liters of Oxygen  0 L     2 Minute Post Oxygen Saturation %  92 %     2 Minute Post Liters of Oxygen  0 L        Oxygen Initial Assessment: Oxygen Initial Assessment - 04/23/17 0716      Initial 6 min Walk   Oxygen Used  None      Program Oxygen Prescription   Program Oxygen Prescription  None       Oxygen Re-Evaluation: Oxygen Re-Evaluation    Row Name 06/04/17 0818 06/29/17 1604 07/16/17 1620         Program Oxygen Prescription   Program Oxygen Prescription  None  None  None       Home Oxygen   Home Oxygen Device  None  None  None     Sleep Oxygen Prescription  None  None  None     Home Exercise Oxygen Prescription  None  None  None     Home at Rest Exercise Oxygen Prescription  None  None  None        Oxygen Discharge (Final Oxygen Re-Evaluation): Oxygen Re-Evaluation - 07/16/17 1620      Program Oxygen Prescription   Program Oxygen Prescription  None      Home Oxygen   Home Oxygen Device  None    Sleep Oxygen Prescription  None    Home Exercise Oxygen Prescription  None    Home at Rest Exercise Oxygen Prescription  None       Initial Exercise Prescription: Initial Exercise Prescription - 04/23/17 0700      Date of Initial Exercise RX and Referring Provider   Date  04/23/17     Referring Provider  Dr. Chase Caller      Treadmill   MPH  1.7    Grade  0    Minutes  17      NuStep   Level  2    SPM  80    Minutes  17    METs  1.5      Rower   Level  1    Watts  20    Minutes  17      Prescription Details   Frequency (times per week)  2    Duration  Progress to 45 minutes of aerobic exercise without signs/symptoms of physical distress      Intensity   THRR 40-80% of Max Heartrate  55-110    Ratings of Perceived Exertion  11-13    Perceived Dyspnea  0-4      Progression   Progression  Continue progressive overload as per policy without signs/symptoms or physical distress.      Resistance Training   Training Prescription  Yes    Weight  blue bands    Reps  10-15       Perform Capillary Blood Glucose checks as needed.  Exercise Prescription Changes: Exercise Prescription Changes    Row Name 04/28/17 1216 05/12/17 1200 05/14/17 1200 05/19/17 1200 05/28/17 1313     Response to Exercise   Blood Pressure (Admit)  100/60  122/70  140/84  108/72  116/70   Blood Pressure (Exercise)  130/74  152/72  138/70  120/74  140/68   Blood Pressure (Exit)  110/65  102/60  110/78  110/64  128/80   Heart Rate (Admit)  86 bpm  89 bpm  90 bpm  95 bpm  86 bpm   Heart Rate (Exercise)  113 bpm  100 bpm  107 bpm  93 bpm  102 bpm   Heart Rate (Exit)  93 bpm  91 bpm  88 bpm  95 bpm  96 bpm   Oxygen Saturation (Admit)  95 %  95 %  96 %  95 %  96 %   Oxygen Saturation (Exercise)  91 %  94 %  93 %  93 %  93 %   Oxygen Saturation (Exit)  95 %  95 %  97 %  95 %  96 %   Rating of Perceived Exertion (Exercise)  _0 Perceived Dyspnea (Exercise)  _1 Duration  Progress to 45 minutes of aerobic exercise without signs/symptoms of physical distress  Progress to 45 minutes of aerobic exercise without  signs/symptoms of physical distress  Progress to 45 minutes of aerobic exercise without signs/symptoms of physical distress  Progress to 45 minutes of aerobic  exercise without signs/symptoms of physical distress  Progress to 45 minutes of aerobic exercise without signs/symptoms of physical distress   Intensity  Other (comment) HRR 40-80%  Other (comment) HRR 40-80%  Other (comment) HRR 40-80%  Other (comment) HRR 40-80%  Other (comment) HRR 40-80%     Resistance Training   Training Prescription  Yes  Yes  Yes  Yes  Yes   Weight  blue bands  blue bands  blue bands  blue bands  blue bands   Reps  10-15  10-15  10-15  10-15  10-15   Time  -  -  -  -  10 Minutes     Treadmill   MPH  1.7  -  -  -  -   Grade  0  -  -  -  -   Minutes  17  -  -  -  -     NuStep   Level  2  3  -  4  4   SPM  80  80  -  80  80   Minutes  17  17  -  17  17   METs  2.2  2.2  -  2.1  1.3     Rower   Level  _0 -   Watts  20  -  -  -  -   Minutes  _1 -     Track   Laps  -  _2 Minutes  -  _3 Row Name 06/04/17 1200 06/11/17 1329 06/16/17 1200 06/18/17 1244 06/23/17 1200     Response to Exercise   Blood Pressure (Admit)  110/62  103/58  108/60  104/64  108/62   Blood Pressure (Exercise)  130/70  130/64  136/70  118/66  120/70   Blood Pressure (Exit)  108/70  110/66  110/72  118/70  108/64   Heart Rate (Admit)  67 bpm  77 bpm  79 bpm  86 bpm  68 bpm   Heart Rate (Exercise)  107 bpm  98 bpm  108 bpm  106 bpm  102 bpm   Heart Rate (Exit)  91 bpm  86 bpm  80 bpm  96 bpm  83 bpm   Oxygen Saturation (Admit)  97 %  96 %  95 %  96 %  96 %   Oxygen Saturation (Exercise)  94 %  94 %  91 %  92 %  94 %   Oxygen Saturation (Exit)  96 %  97 %  95 %  96 %  95 %   Rating of Perceived Exertion (Exercise)  _4 Perceived Dyspnea (Exercise)  _5 0   Duration  Progress to 45 minutes of aerobic exercise without signs/symptoms of physical distress  Progress to 45 minutes of aerobic exercise without signs/symptoms of physical distress  Progress to 45 minutes of aerobic exercise without signs/symptoms of physical  distress  Progress to 45 minutes of aerobic exercise without signs/symptoms of physical distress  Progress to 45 minutes of aerobic exercise without signs/symptoms of physical distress  Intensity  THRR unchanged  THRR unchanged  THRR unchanged  THRR unchanged  THRR unchanged     Progression   Progression  Continue to progress workloads to maintain intensity without signs/symptoms of physical distress.  Continue to progress workloads to maintain intensity without signs/symptoms of physical distress.  Continue to progress workloads to maintain intensity without signs/symptoms of physical distress.  Continue to progress workloads to maintain intensity without signs/symptoms of physical distress.  Continue to progress workloads to maintain intensity without signs/symptoms of physical distress.     Resistance Training   Training Prescription  Yes  Yes  Yes  Yes  Yes   Weight  blue bands  blue bands  blue bands  blue bands  blue bands   Reps  10-15  10-15  10-15  10-15  10-15   Time  10 Minutes  10 Minutes  10 Minutes  10 Minutes  10 Minutes     Bike   Level  _0 Minutes  _1 NuStep   Level  _2 -  5   Minutes  _3 -  17   METs  2.7  2.2  2.2  -  2.5     Track   Laps  17  -  _4 Minutes  17  -  _5 Row Name 06/25/17 1200 06/30/17 1245 07/02/17 1238 07/07/17 1200 07/14/17 1200     Response to Exercise   Blood Pressure (Admit)  100/66  120/72  102/60  108/60  122/70   Blood Pressure (Exercise)  130/80  120/60  120/60  120/80  140/80   Blood Pressure (Exit)  116/60  100/60  102/60  110/60  116/64   Heart Rate (Admit)  75 bpm  73 bpm  80 bpm  57 bpm  69 bpm   Heart Rate (Exercise)  94 bpm  108 bpm  98 bpm  98 bpm  106 bpm   Heart Rate (Exit)  82 bpm  93 bpm  84 bpm  87 bpm  90 bpm   Oxygen Saturation (Admit)  96 %  96 %  96 %  96 %  96 %   Oxygen Saturation (Exercise)  94 %  92 %  90 %  93 %  91 %   Oxygen Saturation (Exit)  96 %   94 %  93 %  95 %  96 %   Rating of Perceived Exertion (Exercise)  _6 Perceived Dyspnea (Exercise)  0  _7 Duration  Progress to 45 minutes of aerobic exercise without signs/symptoms of physical distress  Progress to 45 minutes of aerobic exercise without signs/symptoms of physical distress  Progress to 45 minutes of aerobic exercise without signs/symptoms of physical distress  Progress to 45 minutes of aerobic exercise without signs/symptoms of physical distress  Progress to 45 minutes of aerobic exercise without signs/symptoms of physical distress   Intensity  THRR unchanged  THRR unchanged  THRR unchanged  THRR unchanged  THRR unchanged     Progression   Progression  Continue to progress workloads to maintain intensity without signs/symptoms of physical distress.  Continue to progress workloads to maintain intensity without signs/symptoms  of physical distress.  Continue to progress workloads to maintain intensity without signs/symptoms of physical distress.  Continue to progress workloads to maintain intensity without signs/symptoms of physical distress.  Continue to progress workloads to maintain intensity without signs/symptoms of physical distress.     Resistance Training   Training Prescription  Yes  Yes  Yes  Yes  Yes   Weight  blue bands  blue bands  blue bands  blue bands  blue bands   Reps  10-15  10-15  10-15  10-15  10-15   Time  10 Minutes  10 Minutes  10 Minutes  10 Minutes  10 Minutes     Bike   Level  _0 Minutes  _1 NuStep   Level  _2 Minutes  _3 METs  2.5  1.8  2.6  2.5  2.4     Track   Laps  -  _4 Minutes  -  _5 Home Exercise Plan   Plans to continue exercise at  Home (comment)  Home (comment)  Home (comment)  -  -   Frequency  Add 3 additional days to program exercise sessions.  Add 3 additional days to program exercise sessions.  Add 3 additional days  to program exercise sessions.  -  -      Exercise Comments: Exercise Comments    Row Name 06/25/17 1259           Exercise Comments  Home exercise completed          Exercise Goals and Review:   Exercise Goals Re-Evaluation : Exercise Goals Re-Evaluation    Row Name 05/11/17 1628 05/28/17 0736 06/29/17 1604 07/16/17 1620       Exercise Goal Re-Evaluation   Exercise Goals Review  Increase Strength and Stamina;Increase Physical Activity;Able to understand and use Dyspnea scale;Able to understand and use rate of perceived exertion (RPE) scale;Knowledge and understanding of Target Heart Rate Range (THRR);Understanding of Exercise Prescription  Increase Strength and Stamina;Increase Physical Activity;Able to understand and use Dyspnea scale;Able to understand and use rate of perceived exertion (RPE) scale;Knowledge and understanding of Target Heart Rate Range (THRR);Understanding of Exercise Prescription  Increase Strength and Stamina;Increase Physical Activity;Able to understand and use Dyspnea scale;Able to understand and use rate of perceived exertion (RPE) scale;Knowledge and understanding of Target Heart Rate Range (THRR);Understanding of Exercise Prescription  Increase Strength and Stamina;Increase Physical Activity;Able to understand and use Dyspnea scale;Able to understand and use rate of perceived exertion (RPE) scale;Knowledge and understanding of Target Heart Rate Range (THRR);Understanding of Exercise Prescription    Comments  Patient has only attended one exercise session. Has been out due to illness. Will cont. to progress as able.   Patient has only attended 4 rehab sesisons. Has been out of town for the holidays. Will cont. to monitor and progress as able.   Patient is progressing well in program. Home exercise completed. Is able to walk 21 laps (200 ft. each) in 15 minutes.   Patient is progressing well in program. Home exercise completed. Is able to walk 24 laps (200 ft. each)  in 15 minutes.     Expected Outcomes  Through  exercise at rehab and at home, patient will increase strength and stamina making ADL's easier to perform. Patient will also have a better understanding of safe exercise and what they are capable to do outside of clinical supervision.  Through exercise at rehab and at home, patient will increase strength and stamina making ADL's easier to perform. Patient will also have a better understanding of safe exercise and what they are capable to do outside of clinical supervision.  Through exercise at rehab and at home, patient will increase strength and stamina making ADL's easier to perform. Patient will also have a better understanding of safe exercise and what they are capable to do outside of clinical supervision.  Through exercise at rehab and at home, patient will increase strength and stamina making ADL's easier to perform. Patient will also have a better understanding of safe exercise and what they are capable to do outside of clinical supervision.       Discharge Exercise Prescription (Final Exercise Prescription Changes): Exercise Prescription Changes - 07/14/17 1200      Response to Exercise   Blood Pressure (Admit)  122/70    Blood Pressure (Exercise)  140/80    Blood Pressure (Exit)  116/64    Heart Rate (Admit)  69 bpm    Heart Rate (Exercise)  106 bpm    Heart Rate (Exit)  90 bpm    Oxygen Saturation (Admit)  96 %    Oxygen Saturation (Exercise)  91 %    Oxygen Saturation (Exit)  96 %    Rating of Perceived Exertion (Exercise)  7    Perceived Dyspnea (Exercise)  1    Duration  Progress to 45 minutes of aerobic exercise without signs/symptoms of physical distress    Intensity  THRR unchanged      Progression   Progression  Continue to progress workloads to maintain intensity without signs/symptoms of physical distress.      Resistance Training   Training Prescription  Yes    Weight  blue bands    Reps  10-15    Time  10 Minutes       Bike   Level  1    Minutes  17      NuStep   Level  5    Minutes  17    METs  2.4      Track   Laps  21    Minutes  17       Nutrition:  Target Goals: Understanding of nutrition guidelines, daily intake of sodium <1514m, cholesterol <2055m calories 30% from fat and 7% or less from saturated fats, daily to have 5 or more servings of fruits and vegetables.  Biometrics: Pre Biometrics - 04/20/17 1024      Pre Biometrics   Grip Strength  28 kg        Nutrition Therapy Plan and Nutrition Goals: Nutrition Therapy & Goals - 05/08/17 0934      Nutrition Therapy   Diet  General, healthful      Personal Nutrition Goals   Nutrition Goal  Identify food quantities necessary to achieve desired wt loss at graduation from pulmonary rehab.      Intervention Plan   Intervention  Prescribe, educate and counsel regarding individualized specific dietary modifications aiming towards targeted core components such as weight, hypertension, lipid management, diabetes, heart failure and other comorbidities.    Expected Outcomes  Short Term Goal: Understand basic principles of dietary content, such as calories, fat, sodium, cholesterol and  nutrients.;Long Term Goal: Adherence to prescribed nutrition plan.       Nutrition Assessments: Nutrition Assessments - 05/08/17 0930      Rate Your Plate Scores   Pre Score  58       Nutrition Goals Re-Evaluation:   Nutrition Goals Discharge (Final Nutrition Goals Re-Evaluation):   Psychosocial: Target Goals: Acknowledge presence or absence of significant depression and/or stress, maximize coping skills, provide positive support system. Participant is able to verbalize types and ability to use techniques and skills needed for reducing stress and depression.  Initial Review & Psychosocial Screening: Initial Psych Review & Screening - 04/20/17 1028      Initial Review   Current issues with  None Identified      Family Dynamics   Good Support  System?  Yes      Barriers   Psychosocial barriers to participate in program  There are no identifiable barriers or psychosocial needs.       Quality of Life Scores:  Scores of 19 and below usually indicate a poorer quality of life in these areas.  A difference of  2-3 points is a clinically meaningful difference.  A difference of 2-3 points in the total score of the Quality of Life Index has been associated with significant improvement in overall quality of life, self-image, physical symptoms, and general health in studies assessing change in quality of life.   PHQ-9: Recent Review Flowsheet Data    Depression screen [facility] 2/9 04/20/2017 10/30/2015 10/20/2014 10/14/2013 10/14/2013   Decreased Interest - 0 0 0 0   Down, Depressed, Hopeless 0 0 0 0 0   PHQ - 2 Score 0 0 0 0 0     Interpretation of Total Score  Total Score Depression Severity:  1-4 = Minimal depression, 5-9 = Mild depression, 10-14 = Moderate depression, 15-19 = Moderately severe depression, 20-27 = Severe depression   Psychosocial Evaluation and Intervention: Psychosocial Evaluation - 04/20/17 1029      Psychosocial Evaluation & Interventions   Interventions  Encouraged to exercise with the program and follow exercise prescription    Continue Psychosocial Services   No Follow up required       Psychosocial Re-Evaluation: Psychosocial Re-Evaluation    [place] Name 05/12/17 1417 06/04/17 0820 06/29/17 1304 07/20/17 1640       Psychosocial Re-Evaluation   Current issues with  None Identified  None Identified  None Identified  None Identified    Expected Outcomes  patient will remain free from psychosocial barriers to participation in pulmonary rehab  patient will remain free from psychosocial barriers to participation in pulmonary rehab  patient will remain free from psychosocial barriers to participation in pulmonary rehab  patient will remain free from psychosocial barriers to participation in pulmonary rehab     Interventions  Encouraged to attend Pulmonary Rehabilitation for the exercise  Encouraged to attend Pulmonary Rehabilitation for the exercise  Encouraged to attend Pulmonary Rehabilitation for the exercise  Encouraged to attend Pulmonary Rehabilitation for the exercise    Continue Psychosocial Services   No Follow up required  No Follow up required  No Follow up required  No Follow up required       Psychosocial Discharge (Final Psychosocial Re-Evaluation): Psychosocial Re-Evaluation - 07/20/17 1640      Psychosocial Re-Evaluation   Current issues with  None Identified    Expected Outcomes  patient will remain free from psychosocial barriers to participation in pulmonary rehab    Interventions  Encouraged to attend  Pulmonary Rehabilitation for the exercise    Continue Psychosocial Services   No Follow up required       Education: Education Goals: Education classes will be provided on a weekly basis, covering required topics. Participant will state understanding/return demonstration of topics presented.  Learning Barriers/Preferences: Learning Barriers/Preferences - 04/20/17 1020      Learning Barriers/Preferences   Learning Barriers  None    Learning Preferences  Individual Instruction;Skilled Demonstration       Education Topics: Risk Factor Reduction:  -Group instruction that is supported by a PowerPoint presentation. Instructor discusses the definition of a risk factor, different risk factors for pulmonary disease, and how the heart and lungs work together.     Nutrition for Pulmonary Patient:  -Group instruction provided by PowerPoint slides, verbal discussion, and written materials to support subject matter. The instructor gives an explanation and review of healthy diet recommendations, which includes a discussion on weight management, recommendations for fruit and vegetable consumption, as well as protein, fluid, caffeine, fiber, sodium, sugar, and alcohol. Tips for eating  when patients are short of breath are discussed.   PULMONARY REHAB OTHER RESPIRATORY from 07/16/2017 in [place]  Date  06/11/17  Educator  edna      Pursed Lip Breathing:  -Group instruction that is supported by demonstration and informational handouts. Instructor discusses the benefits of pursed lip and diaphragmatic breathing and detailed demonstration on how to preform both.     Oxygen Safety:  -Group instruction provided by PowerPoint, verbal discussion, and written material to support subject matter. There is an overview of "What is Oxygen" and "Why do we need it".  Instructor also reviews how to create a safe environment for oxygen use, the importance of using oxygen as prescribed, and the risks of noncompliance. There is a brief discussion on traveling with oxygen and resources the patient may utilize.   PULMONARY REHAB OTHER RESPIRATORY from 07/16/2017 in [place]  Date  07/16/17  Educator  RN  Instruction Review Code  2- Demonstrated Understanding      Oxygen Equipment:  -Group instruction provided by [facility] Staff utilizing handouts, written materials, and equipment demonstrations.   Signs and Symptoms:  -Group instruction provided by written material and verbal discussion to support subject matter. Warning signs and symptoms of infection, stroke, and heart attack are reviewed and when to call the physician/911 reinforced. Tips for preventing the spread of infection discussed.   Advanced Directives:  -Group instruction provided by verbal instruction and written material to support subject matter. Instructor reviews Advanced Directive laws and proper instruction for filling out document.   Pulmonary Video:  -Group video education that reviews the importance of medication and oxygen compliance, exercise, good nutrition, pulmonary hygiene, and pursed lip and diaphragmatic breathing for the pulmonary  patient.   Exercise for the Pulmonary Patient:  -Group instruction that is supported by a PowerPoint presentation. Instructor discusses benefits of exercise, core components of exercise, frequency, duration, and intensity of an exercise routine, importance of utilizing pulse oximetry during exercise, safety while exercising, and options of places to exercise outside of rehab.     Pulmonary Medications:  -Verbally interactive group education provided by instructor with focus on inhaled medications and proper administration.   PULMONARY REHAB OTHER RESPIRATORY from 07/16/2017 in [place]  Date  06/25/17  Educator  PharmD      Anatomy and Physiology of the Respiratory System and Intimacy:  -  Group instruction provided by PowerPoint, verbal discussion, and written material to support subject matter. Instructor reviews respiratory cycle and anatomical components of the respiratory system and their functions. Instructor also reviews differences in obstructive and restrictive respiratory diseases with examples of each. Intimacy, Sex, and Sexuality differences are reviewed with a discussion on how relationships can change when diagnosed with pulmonary disease. Common sexual concerns are reviewed.   PULMONARY REHAB OTHER RESPIRATORY from 07/16/2017 in [place]  Date  05/28/17  Educator  RN      MD DAY -A group question and answer session with a medical doctor that allows participants to ask questions that relate to their pulmonary disease state.   PULMONARY REHAB OTHER RESPIRATORY from 07/16/2017 in [place]  Date  06/18/17  Educator  Dr. Nelda Marseille      OTHER EDUCATION -Group or individual verbal, written, or video instructions that support the educational goals of the pulmonary rehab program.   PULMONARY REHAB OTHER RESPIRATORY from 07/16/2017 in [place]  Date   07/02/17  Educator  EP  Instruction Review Code  1- Verbalizes Understanding      Holiday Eating Survival Tips:  -Group instruction provided by PowerPoint slides, verbal discussion, and written materials to support subject matter. The instructor gives patients tips, tricks, and techniques to help them not only survive but enjoy the holidays despite the onslaught of food that accompanies the holidays.   Knowledge Questionnaire Score:   Core Components/Risk Factors/Patient Goals at Admission: Personal Goals and Risk Factors at Admission - 04/20/17 1027      Core Components/Risk Factors/Patient Goals on Admission   Improve shortness of breath with ADL's  Yes    Intervention  Provide education, individualized exercise plan and daily activity instruction to help decrease symptoms of SOB with activities of daily living.    Expected Outcomes  Short Term: Achieves a reduction of symptoms when performing activities of daily living.    Develop more efficient breathing techniques such as purse lipped breathing and diaphragmatic breathing; and practicing self-pacing with activity  Yes    Intervention  Provide education, demonstration and support about specific breathing techniuqes utilized for more efficient breathing. Include techniques such as pursed lipped breathing, diaphragmatic breathing and self-pacing activity.    Expected Outcomes  Short Term: Participant will be able to demonstrate and use breathing techniques as needed throughout daily activities.    Increase knowledge of respiratory medications and ability to use respiratory devices properly   Yes    Intervention  Provide education and demonstration as needed of appropriate use of medications, inhalers, and oxygen therapy.    Expected Outcomes  Short Term: Achieves understanding of medications use. Understands that oxygen is a medication prescribed by physician. Demonstrates appropriate use of inhaler and oxygen therapy.       Core  Components/Risk Factors/Patient Goals Review:  Goals and Risk Factor Review    Row Name 05/12/17 1415 06/04/17 0819 06/29/17 1302 07/20/17 1637       Core Components/Risk Factors/Patient Goals Review   Personal Goals Review  Improve shortness of breath with ADL's;Increase knowledge of respiratory medications and ability to use respiratory devices properly.;Develop more efficient breathing techniques such as purse lipped breathing and diaphragmatic breathing and practicing self-pacing with activity.  Improve shortness of breath with ADL's;Increase knowledge of respiratory medications and ability to use respiratory devices properly.;Develop more efficient breathing techniques such as purse lipped breathing and diaphragmatic breathing and practicing  self-pacing with activity.  Improve shortness of breath with ADL's;Increase knowledge of respiratory medications and ability to use respiratory devices properly.;Develop more efficient breathing techniques such as purse lipped breathing and diaphragmatic breathing and practicing self-pacing with activity.  Improve shortness of breath with ADL's;Increase knowledge of respiratory medications and ability to use respiratory devices properly.;Develop more efficient breathing techniques such as purse lipped breathing and diaphragmatic breathing and practicing self-pacing with activity.    Review  patient is doing well in pulmonary rehab. He seems to enjoy his sessions and engages in conversation with the staff and classmates when here. He missed both exercise sessions last week related to a head cold. He has only attended 2 exercise sessions since admission and it is too soon to evaluate progress towards goals.  Patient is doing great in pulmonary rehab. He is tolerating workload increases and states he is seeing an improvement in his shortness of breath at home with activities. He is utilizing PLB when prompted.  Patient continues to do great in pulmonary rehab. He is  tolerating workload increases and rates his exertion 11 or less. He states his shortness of breath has significantly improved and his tolerance for exertions has as well. He recently attended an education session on pulmonary medications taught by a Pharm D. Expect to continue to see improvements over the next 30 days.  continues to tolerate workload increases. walks 24 laps around the track in 15 min. utilizes PLB independently. Patient close to meeting goals for graduation. will assess plans to continue exercise at home over the next several visits and review personal goals.    Expected Outcomes  see admission outcomes  see admission outcomes  see admission outcomes  see admission outcomes       Core Components/Risk Factors/Patient Goals at Discharge (Final Review):  Goals and Risk Factor Review - 07/20/17 1637      Core Components/Risk Factors/Patient Goals Review   Personal Goals Review  Improve shortness of breath with ADL's;Increase knowledge of respiratory medications and ability to use respiratory devices properly.;Develop more efficient breathing techniques such as purse lipped breathing and diaphragmatic breathing and practicing self-pacing with activity.    Review  continues to tolerate workload increases. walks 24 laps around the track in 15 min. utilizes PLB independently. Patient close to meeting goals for graduation. will assess plans to continue exercise at home over the next several visits and review personal goals.    Expected Outcomes  see admission outcomes       ITP Comments:   Comments: patient has attended 17 sessions since admission

## 2017-07-21 NOTE — Progress Notes (Signed)
Daily Session Note  Patient Details  Name: Jason Summers MRN: [id number] Date of Birth: [birth year] Referring Provider:     Pulmonary Rehab Walk Test from 04/21/2017 in [place]  Referring Provider  Dr. Chase Caller      Encounter Date: 07/21/2017  Check In: Session Check In - 07/21/17 1217      Check-In   Location  [facility]    Staff Present  Su Hilt, MS, ACSM RCEP, Exercise Physiologist;Nabiha Planck Rollene Rotunda, RN, Maxcine Ham, RN, Roque Cash, RN    Supervising physician immediately available to respond to emergencies  [facility] immediately available    Physician(s)  Dr. Lonny Prude    Medication changes reported      No    Fall or balance concerns reported     No    Tobacco Cessation  No Change    Warm-up and Cool-down  Performed as group-led instruction    Resistance Training Performed  Yes    VAD Patient?  No      Pain Assessment   Currently in Pain?  No/denies    Multiple Pain Sites  No       Capillary Blood Glucose: No results found for this or any previous visit (from the past 24 hour(s)).    Social History   Tobacco Use  Smoking Status Former Smoker  . Packs/day: 0.20  . Years: 62.00  . Pack years: 12.40  . Types: Cigarettes  . Last attempt to quit: 03/10/2011  . Years since quitting: 6.3  Smokeless Tobacco Never Used  Tobacco Comment   smokes 1 ciagettes occasionally    Goals Met:  Independence with exercise equipment Improved SOB with ADL's Using PLB without cueing & demonstrates good technique Exercise tolerated well No report of cardiac concerns or symptoms Strength training completed today  Goals Unmet:  Not Applicable  Comments: Service time is from 1030 to 1210   Dr. Rush Farmer is Medical Director for Pulmonary Rehab at [facility].

## 2017-07-23 ENCOUNTER — Encounter (HOSPITAL_COMMUNITY)
Admission: RE | Admit: 2017-07-23 | Discharge: 2017-07-23 | Disposition: A | Discharge status: Home / Self Care | Payer: Medicare Other | Source: Ambulatory Visit | Attending: Internal Medicine | Admitting: Internal Medicine

## 2017-07-23 DIAGNOSIS — J439 Emphysema, unspecified: Secondary | ICD-10-CM

## 2017-07-23 NOTE — Progress Notes (Signed)
Daily Session Note  Patient Details  Name: Jason Summers MRN: [id number] Date of Birth: 1931-10-30 Referring Provider:     Pulmonary Rehab Walk Test from 04/21/2017 in [place]  Referring Provider  Dr. Chase Caller      Encounter Date: 07/23/2017  Check In: Session Check In - 07/23/17 1051      Check-In   Location  [facility]    Staff Present  Su Hilt, MS, ACSM RCEP, Exercise Physiologist;Shaney Deckman Rollene Rotunda, RN, Roque Cash, RN    Supervising physician immediately available to respond to emergencies  [facility] immediately available    Physician(s)  Dr. Tyrell Antonio    Medication changes reported      No    Fall or balance concerns reported     No    Tobacco Cessation  No Change    Warm-up and Cool-down  Performed as group-led instruction    Resistance Training Performed  Yes    VAD Patient?  No      Pain Assessment   Currently in Pain?  No/denies    Multiple Pain Sites  No       Capillary Blood Glucose: No results found for this or any previous visit (from the past 24 hour(s)).    Social History   Tobacco Use  Smoking Status Former Smoker  . Packs/day: 0.20  . Years: 62.00  . Pack years: 12.40  . Types: Cigarettes  . Last attempt to quit: 03/10/2011  . Years since quitting: 6.3  Smokeless Tobacco Never Used  Tobacco Comment   smokes 1 ciagettes occasionally    Goals Met:  Independence with exercise equipment Improved SOB with ADL's Using PLB without cueing & demonstrates good technique Exercise tolerated well No report of cardiac concerns or symptoms Strength training completed today  Goals Unmet:  Not Applicable  Comments: Service time is from 1030 to 1235   Dr. Rush Farmer is Medical Director for Pulmonary Rehab at [facility]

## 2017-07-28 ENCOUNTER — Encounter (HOSPITAL_COMMUNITY)
Admission: RE | Admit: 2017-07-28 | Discharge: 2017-07-28 | Disposition: A | Discharge status: Home / Self Care | Payer: Medicare Other | Source: Ambulatory Visit | Attending: Internal Medicine | Admitting: Internal Medicine

## 2017-07-28 VITALS — Wt 173.1 lb

## 2017-07-28 DIAGNOSIS — J439 Emphysema, unspecified: Secondary | ICD-10-CM | POA: Diagnosis not present

## 2017-07-28 NOTE — Progress Notes (Signed)
Daily Session Note  Patient Details  Name: Jason Summers MRN: [id number] Date of Birth: [birth year] Referring Provider:     Pulmonary Rehab Walk Test from 04/21/2017 in [place]  Referring Provider  Dr. Chase Caller      Encounter Date: 07/28/2017  Check In: Session Check In - 07/28/17 1030      Check-In   Location  [facility]    Staff Present  Rosebud Poles, RN, Luisa Hart, RN, BSN;Harshita Bernales, MS, ACSM RCEP, Exercise Physiologist;Lisa Ysidro Evert, RN    Supervising physician immediately available to respond to emergencies  [facility] immediately available    Physician(s)  Dr. Tyrell Antonio    Medication changes reported      No    Fall or balance concerns reported     No    Tobacco Cessation  No Change    Warm-up and Cool-down  Performed as group-led instruction    Resistance Training Performed  Yes    VAD Patient?  No      Pain Assessment   Currently in Pain?  No/denies    Multiple Pain Sites  No       Capillary Blood Glucose: No results found for this or any previous visit (from the past 24 hour(s)).  Exercise Prescription Changes - 07/28/17 1200      Response to Exercise   Blood Pressure (Admit)  104/64    Blood Pressure (Exercise)  130/70    Blood Pressure (Exit)  104/60    Heart Rate (Admit)  82 bpm    Heart Rate (Exercise)  103 bpm    Heart Rate (Exit)  90 bpm    Oxygen Saturation (Admit)  98 %    Oxygen Saturation (Exercise)  92 %    Oxygen Saturation (Exit)  95 %    Rating of Perceived Exertion (Exercise)  7    Perceived Dyspnea (Exercise)  1    Duration  Progress to 45 minutes of aerobic exercise without signs/symptoms of physical distress    Intensity  THRR unchanged      Progression   Progression  Continue to progress workloads to maintain intensity without signs/symptoms of physical distress.      Resistance Training   Training Prescription  Yes    Weight  blue bands    Reps  10-15    Time   10 Minutes      Bike   Level  1.3    Minutes  17      NuStep   Level  5    Minutes  17    METs  2.9      Track   Laps  23    Minutes  17       Social History   Tobacco Use  Smoking Status Former Smoker  . Packs/day: 0.20  . Years: 62.00  . Pack years: 12.40  . Types: Cigarettes  . Last attempt to quit: 03/10/2011  . Years since quitting: 6.3  Smokeless Tobacco Never Used  Tobacco Comment   smokes 1 ciagettes occasionally    Goals Met:  Exercise tolerated well No report of cardiac concerns or symptoms Strength training completed today  Goals Unmet:  Not Applicable  Comments: Service time is from 10:30a to 12:00p    Dr. Rush Farmer is Medical Director for Pulmonary Rehab at [facility].

## 2017-07-30 ENCOUNTER — Encounter (HOSPITAL_COMMUNITY)
Admission: RE | Admit: 2017-07-30 | Discharge: 2017-07-30 | Disposition: A | Discharge status: Home / Self Care | Payer: Medicare Other | Source: Ambulatory Visit | Attending: Internal Medicine | Admitting: Internal Medicine

## 2017-07-30 DIAGNOSIS — J439 Emphysema, unspecified: Secondary | ICD-10-CM | POA: Diagnosis not present

## 2017-07-30 NOTE — Progress Notes (Signed)
Daily Session Note  Patient Details  Name: Jason Summers MRN: [id number] Date of Birth: 11/12/1931 Referring Provider:     Pulmonary Rehab Walk Test from 04/21/2017 in [place]  Referring Provider  Dr. Chase Caller      Encounter Date: 07/30/2017  Check In: Session Check In - 07/30/17 1030      Check-In   Location  [facility]    Staff Present  Rosebud Poles, RN, BSN;Tracee Mccreery, MS, ACSM RCEP, Exercise Physiologist;Lisa Ysidro Evert, RN;Portia Rollene Rotunda, RN, BSN    Supervising physician immediately available to respond to emergencies  [facility] immediately available    Physician(s)  Dr. Horris Latino    Medication changes reported      No    Fall or balance concerns reported     No    Tobacco Cessation  No Change    Warm-up and Cool-down  Performed as group-led instruction    Resistance Training Performed  Yes    VAD Patient?  No      Pain Assessment   Currently in Pain?  No/denies    Multiple Pain Sites  No       Capillary Blood Glucose: No results found for this or any previous visit (from the past 24 hour(s)).    Social History   Tobacco Use  Smoking Status Former Smoker  . Packs/day: 0.20  . Years: 62.00  . Pack years: 12.40  . Types: Cigarettes  . Last attempt to quit: 03/10/2011  . Years since quitting: 6.3  Smokeless Tobacco Never Used  Tobacco Comment   smokes 1 ciagettes occasionally    Goals Met:  Exercise tolerated well No report of cardiac concerns or symptoms Strength training completed today  Goals Unmet:  Not Applicable  Comments: Service time is from 10:30a to 12:15p    Dr. Rush Farmer is Medical Director for Pulmonary Rehab at [facility].

## 2017-08-04 ENCOUNTER — Encounter (HOSPITAL_COMMUNITY)
Admission: RE | Admit: 2017-08-04 | Discharge: 2017-08-04 | Disposition: A | Discharge status: Home / Self Care | Payer: Medicare Other | Source: Ambulatory Visit | Attending: Internal Medicine | Admitting: Internal Medicine

## 2017-08-04 DIAGNOSIS — J439 Emphysema, unspecified: Secondary | ICD-10-CM | POA: Diagnosis not present

## 2017-08-06 ENCOUNTER — Ambulatory Visit (HOSPITAL_COMMUNITY)
Admission: RE | Admit: 2017-08-06 | Discharge: 2017-08-06 | Disposition: A | Discharge status: Home / Self Care | Payer: Medicare Other | Source: Ambulatory Visit | Attending: Internal Medicine | Admitting: Internal Medicine

## 2017-08-06 ENCOUNTER — Encounter (HOSPITAL_COMMUNITY): Payer: Medicare Other

## 2017-08-06 ENCOUNTER — Inpatient Hospital Stay: Payer: Medicare Other | Attending: Internal Medicine

## 2017-08-06 DIAGNOSIS — J9811 Atelectasis: Secondary | ICD-10-CM | POA: Diagnosis not present

## 2017-08-06 DIAGNOSIS — X58XXXA Exposure to other specified factors, initial encounter: Secondary | ICD-10-CM | POA: Diagnosis not present

## 2017-08-06 DIAGNOSIS — R519 Headache, unspecified: Secondary | ICD-10-CM

## 2017-08-06 DIAGNOSIS — R51 Headache: Secondary | ICD-10-CM | POA: Insufficient documentation

## 2017-08-06 DIAGNOSIS — M4854XA Collapsed vertebra, not elsewhere classified, thoracic region, initial encounter for fracture: Secondary | ICD-10-CM | POA: Insufficient documentation

## 2017-08-06 DIAGNOSIS — I700 Atherosclerosis of aorta: Secondary | ICD-10-CM | POA: Diagnosis not present

## 2017-08-06 DIAGNOSIS — J439 Emphysema, unspecified: Secondary | ICD-10-CM | POA: Diagnosis not present

## 2017-08-06 DIAGNOSIS — C3492 Malignant neoplasm of unspecified part of left bronchus or lung: Secondary | ICD-10-CM | POA: Insufficient documentation

## 2017-08-06 DIAGNOSIS — J849 Interstitial pulmonary disease, unspecified: Secondary | ICD-10-CM | POA: Insufficient documentation

## 2017-08-06 LAB — COMPREHENSIVE METABOLIC PANEL
ALK PHOS: 81 U/L (ref 40–150)
ALT: 11 U/L (ref 0–55)
ANION GAP: 9 (ref 3–11)
AST: 15 U/L (ref 5–34)
Albumin: 3.5 g/dL (ref 3.5–5.0)
BUN: 16 mg/dL (ref 7–26)
CALCIUM: 9.2 mg/dL (ref 8.4–10.4)
CO2: 24 mmol/L (ref 22–29)
CREATININE: 0.88 mg/dL (ref 0.70–1.30)
Chloride: 105 mmol/L (ref 98–109)
Glucose, Bld: 98 mg/dL (ref 70–140)
Potassium: 4.6 mmol/L (ref 3.5–5.1)
Sodium: 138 mmol/L (ref 136–145)
TOTAL PROTEIN: 7.4 g/dL (ref 6.4–8.3)
Total Bilirubin: 0.6 mg/dL (ref 0.2–1.2)

## 2017-08-06 LAB — CBC WITH DIFFERENTIAL/PLATELET
BASOS ABS: 0 10*3/uL (ref 0.0–0.1)
Basophils Relative: 0 %
Eosinophils Absolute: 0.1 10*3/uL (ref 0.0–0.5)
Eosinophils Relative: 2 %
HCT: 43.6 % (ref 38.4–49.9)
Hemoglobin: 14.3 g/dL (ref 13.0–17.1)
LYMPHS PCT: 16 %
Lymphs Abs: 1 10*3/uL (ref 0.9–3.3)
MCH: 28.9 pg (ref 27.2–33.4)
MCHC: 32.8 g/dL (ref 32.0–36.0)
MCV: 88.3 fL (ref 79.3–98.0)
MONO ABS: 0.7 10*3/uL (ref 0.1–0.9)
Monocytes Relative: 11 %
Neutro Abs: 4.4 10*3/uL (ref 1.5–6.5)
Neutrophils Relative %: 71 %
PLATELETS: 151 10*3/uL (ref 140–400)
RBC: 4.94 MIL/uL (ref 4.20–5.82)
RDW: 14.1 % (ref 11.0–14.6)
WBC: 6.3 10*3/uL (ref 4.0–10.3)

## 2017-08-06 MED ORDER — IOPAMIDOL (ISOVUE-300) INJECTION 61%
INTRAVENOUS | Status: AC
  Filled 2017-08-06: qty 75

## 2017-08-06 MED ORDER — IOPAMIDOL (ISOVUE-300) INJECTION 61%
75.0000 mL | Freq: Once | INTRAVENOUS | Status: AC | PRN
  Administered 2017-08-06: 75 mL via INTRAVENOUS

## 2017-08-10 DIAGNOSIS — H6983 Other specified disorders of Eustachian tube, bilateral: Secondary | ICD-10-CM

## 2017-08-10 DIAGNOSIS — J00 Acute nasopharyngitis [common cold]: Secondary | ICD-10-CM

## 2017-08-10 DIAGNOSIS — H6993 Unspecified Eustachian tube disorder, bilateral: Secondary | ICD-10-CM

## 2017-08-10 HISTORY — DX: Acute nasopharyngitis (common cold): J00

## 2017-08-10 HISTORY — DX: Unspecified eustachian tube disorder, bilateral: H69.93

## 2017-08-10 HISTORY — DX: Other specified disorders of eustachian tube, bilateral: H69.83

## 2017-08-13 ENCOUNTER — Encounter: Payer: Self-pay | Admitting: Internal Medicine

## 2017-08-13 ENCOUNTER — Telehealth: Payer: Self-pay | Admitting: Internal Medicine

## 2017-08-13 ENCOUNTER — Inpatient Hospital Stay: Payer: Medicare Other | Attending: Internal Medicine | Admitting: Internal Medicine

## 2017-08-13 DIAGNOSIS — Z7982 Long term (current) use of aspirin: Secondary | ICD-10-CM

## 2017-08-13 DIAGNOSIS — R1310 Dysphagia, unspecified: Secondary | ICD-10-CM | POA: Diagnosis not present

## 2017-08-13 DIAGNOSIS — R918 Other nonspecific abnormal finding of lung field: Secondary | ICD-10-CM | POA: Diagnosis not present

## 2017-08-13 DIAGNOSIS — Z923 Personal history of irradiation: Secondary | ICD-10-CM | POA: Insufficient documentation

## 2017-08-13 DIAGNOSIS — C3490 Malignant neoplasm of unspecified part of unspecified bronchus or lung: Secondary | ICD-10-CM

## 2017-08-13 DIAGNOSIS — M479 Spondylosis, unspecified: Secondary | ICD-10-CM | POA: Diagnosis not present

## 2017-08-13 DIAGNOSIS — Z85828 Personal history of other malignant neoplasm of skin: Secondary | ICD-10-CM

## 2017-08-13 DIAGNOSIS — I700 Atherosclerosis of aorta: Secondary | ICD-10-CM | POA: Diagnosis not present

## 2017-08-13 DIAGNOSIS — R599 Enlarged lymph nodes, unspecified: Secondary | ICD-10-CM

## 2017-08-13 DIAGNOSIS — Z9221 Personal history of antineoplastic chemotherapy: Secondary | ICD-10-CM | POA: Insufficient documentation

## 2017-08-13 DIAGNOSIS — Z79899 Other long term (current) drug therapy: Secondary | ICD-10-CM | POA: Diagnosis not present

## 2017-08-13 DIAGNOSIS — M129 Arthropathy, unspecified: Secondary | ICD-10-CM | POA: Insufficient documentation

## 2017-08-13 DIAGNOSIS — C3432 Malignant neoplasm of lower lobe, left bronchus or lung: Secondary | ICD-10-CM

## 2017-08-13 DIAGNOSIS — K7689 Other specified diseases of liver: Secondary | ICD-10-CM | POA: Insufficient documentation

## 2017-08-13 DIAGNOSIS — J439 Emphysema, unspecified: Secondary | ICD-10-CM | POA: Diagnosis not present

## 2017-08-13 NOTE — Progress Notes (Signed)
[place] [phone]   [phone]) [phone]  OFFICE PROGRESS NOTE  [name], MD [place] [address]  DIAGNOSIS: Stage IIIA (T3, N2, M0) non-small cell lung cancer, poorly differentiated squamous cell carcinoma diagnosed in February 2017 with PDL 1 expression of 35%, presented with large left lower lobe lung mass in addition to left upper lobe lung nodule and mediastinal lymphadenopathy.  PRIOR THERAPY: Course of concurrent chemoradiation with weekly carboplatin for AUC of 2 and paclitaxel 45 MG/M2. Status post 7 cycles, last dose was given 11/19/2015 with partial response.  CURRENT THERAPY: Observation.  INTERVAL HISTORY: Linken Baltzell 84 y.o. male returns to the clinic today for follow-up visit accompanied by his wife.  The patient is feeling fine today with no specific complaints except for chest congestion and mild cough.  He denied having any chest pain, shortness breath or hemoptysis.  He denied having any fever or chills.  He has no nausea, vomiting, diarrhea or constipation.  He denied having any recent weight loss or night sweats.  He had repeat CT scan of the chest performed recently and he is here for evaluation and discussion of the scan results.  MEDICAL HISTORY: Past Medical History:  Diagnosis Date  . Arthritis   . Cancer ([place])    basal cell on right temple  . Hemorrhoids   . Lateral epicondylitis (tennis elbow)   . Non-small cell lung cancer (NSCLC) ([place]) dx'f 07/30/15  . Odynophagia 11/06/2015  . Spondylosis of cervical joint     ALLERGIES:  is allergic to gentamicin and loteprednol-tobramycin.  MEDICATIONS:  Current Outpatient Medications  Medication Sig Dispense Refill  . aspirin 81 MG EC tablet Take 81 mg by mouth every other day. Reported on 10/16/2015    . Cholecalciferol (VITAMIN D3) 50000 units TABS Take 50,000 Units once a week by mouth. Then take 50,000 monthly after 12 weeks. 51 tablet 0  .  esomeprazole (NEXIUM) 40 MG capsule Take 1 capsule (40 mg total) by mouth daily at 12 noon. 90 capsule 3  . fish oil-omega-3 fatty acids 1000 MG capsule Take 2 g by mouth daily.     Marland Kitchen glucosamine-chondroitin 500-400 MG tablet Take 1 tablet by mouth daily. Reported on 09/11/2015     No current facility-administered medications for this visit.     SURGICAL HISTORY:  Past Surgical History:  Procedure Laterality Date  . COLONOSCOPY  2003  . KNEE ARTHROSCOPY  2006   right    REVIEW OF SYSTEMS:  A comprehensive review of systems was negative except for: Respiratory: positive for cough   PHYSICAL EXAMINATION: General appearance: alert, cooperative and no distress Head: Normocephalic, without obvious abnormality, atraumatic Neck: no adenopathy, no JVD, supple, symmetrical, trachea midline and thyroid not enlarged, symmetric, no tenderness/mass/nodules Lymph nodes: Cervical, supraclavicular, and axillary nodes normal. Resp: wheezes bilaterally Back: symmetric, no curvature. ROM normal. No CVA tenderness. Cardio: regular rate and rhythm, S1, S2 normal, no murmur, click, rub or gallop GI: soft, non-tender; bowel sounds normal; no masses,  no organomegaly Extremities: extremities normal, atraumatic, no cyanosis or edema  ECOG PERFORMANCE STATUS: 1 - Symptomatic but completely ambulatory  Blood pressure (!) 146/65, pulse (!) 101, temperature 99.7 F (37.6 C), temperature source Oral, resp. rate (!) 22, height _0  (1.702 m), weight 172 lb 9.6 oz (78.3 kg), SpO2 97 %.  LABORATORY DATA: Lab Results  Component Value Date   WBC 6.3 08/06/2017   HGB 14.3 08/06/2017  HCT 43.6 08/06/2017   MCV 88.3 08/06/2017   PLT 151 08/06/2017      Chemistry      Component Value Date/Time   NA 138 08/06/2017 0837   NA 139 01/27/2017 0826   K 4.6 08/06/2017 0837   K 4.4 01/27/2017 0826   CL 105 08/06/2017 0837   CO2 24 08/06/2017 0837   CO2 25 01/27/2017 0826   BUN 16 08/06/2017 0837   BUN 15.5  01/27/2017 0826   CREATININE 0.88 08/06/2017 0837   CREATININE 0.9 01/27/2017 0826      Component Value Date/Time   CALCIUM 9.2 08/06/2017 0837   CALCIUM 9.1 01/27/2017 0826   ALKPHOS 81 08/06/2017 0837   ALKPHOS 89 01/27/2017 0826   AST 15 08/06/2017 0837   AST 16 01/27/2017 0826   ALT 11 08/06/2017 0837   ALT 13 01/27/2017 0826   BILITOT 0.6 08/06/2017 0837   BILITOT 0.52 01/27/2017 0826       RADIOGRAPHIC STUDIES: Ct Chest W Contrast  Result Date: 08/06/2017 CLINICAL DATA:  Restaging non-small cell lung cancer. EXAM: CT CHEST WITH CONTRAST TECHNIQUE: Multidetector CT imaging of the chest was performed during intravenous contrast administration. CONTRAST:  34m ISOVUE-300 IOPAMIDOL (ISOVUE-300) INJECTION 61% COMPARISON:  PET-CT 12/30/2016 and chest CT 08/05/2016. FINDINGS: Cardiovascular: The heart is normal in size. No pericardial effusion. Stable tortuosity and scattered calcifications involving the thoracic aorta. Stable three-vessel coronary artery calcifications. Mediastinum/Nodes: Stable mediastinal and hilar lymph nodes. Stable 15 mm precarinal lymph node on image number 59. Stable 10.5 mm subcarinal lymph node on image number 71. Stable left paraesophageal 10 mm lymph node on image number 75. The esophagus is grossly normal. Lungs/Pleura: Stable underlying emphysema and peripheral interstitial lung disease/pulmonary fibrosis. Stable radiation changes involving the left lung and persistent left-sided pleural effusion and pleural calcifications. There is now fluid tracking into the left major fissure. No new pulmonary nodules to suggest pulmonary metastatic disease. Upper Abdomen: Stable hepatic cysts. No findings suspicious for hepatic or adrenal gland metastasis. Stable atherosclerotic calcifications involving the abdominal aorta. Musculoskeletal: No chest wall mass, supraclavicular or axillary lymphadenopathy. The thyroid gland is grossly normal. There is a pathologic appearing  compression fracture of T6 with mixed lytic and sclerotic changes. This was not present on the prior chest CT from 2018. In retrospect there was hypermetabolism in this area on the prior PET-CT. Possible small lesion in the posterior aspect of the T5 vertebral body. I do not see any other obvious bone lesions. IMPRESSION: 1. Stable radiation changes involving the left lung but no CT evidence for recurrent tumor. 2. Stable borderline enlarged mediastinal lymph nodes. No new or progressive findings. 3. Advanced emphysematous changes and interstitial lung disease. 4. Persistent left effusion with overlying atelectasis. Fluid is now tracking up into the left major fissure. 5. T6 compression fracture appears pathologic and in retrospect there was hypermetabolism in this area on the prior PET-CT. MRI thoracic spine may be helpful for further evaluation. Possible small lesion in the T5 vertebral body but no other definite bony abnormalities. Aortic Atherosclerosis (ICD10-I70.0) and Emphysema (ICD10-J43.9). Electronically Signed   By: PMarijo SanesM.D.   On: 08/06/2017 10:57   ASSESSMENT AND PLAN:  This is a very pleasant 84years old white male with stage IIIA non-small cell lung cancer, squamous cell carcinoma. The patient completed a course of concurrent chemoradiation with weekly carboplatin and paclitaxel for 7 cycles and tolerated his treatment well except for radiation induced pneumonitis as well as odynophagia and  dysphagia secondary to radiation induced esophagitis.  The patient is currently on observation and he is feeling fine.  Repeat CT scan of the chest performed recently showed no concerning findings for disease progression.  The scan reported a T6 compression fracture but the patient mentions that he has trauma to that area many years ago.  He is not concerned about it.  We will continue to monitor it on upcoming scans. I will see him back for follow-up visit in 6 months for evaluation with repeat CT  scan of the chest for restaging of his disease. He was advised to call immediately if he has any concerning symptoms in the interval. The patient voices understanding of current disease status and treatment options and is in agreement with the current care plan.  All questions were answered. The patient knows to call the clinic with any problems, questions or concerns. We can certainly see the patient much sooner if necessary.  Disclaimer: This note was dictated with voice recognition software. Similar sounding words can inadvertently be transcribed and may not be corrected upon review.

## 2017-08-13 NOTE — Telephone Encounter (Signed)
Appointments scheduled AVS/Calendar printed per 3/7 los

## 2017-08-25 ENCOUNTER — Ambulatory Visit (INDEPENDENT_AMBULATORY_CARE_PROVIDER_SITE_OTHER): Payer: Medicare Other | Admitting: Internal Medicine

## 2017-08-25 ENCOUNTER — Encounter: Payer: Self-pay | Admitting: Internal Medicine

## 2017-08-25 VITALS — BP 110/60 | HR 56 | Temp 97.8°F | Wt 170.0 lb

## 2017-08-25 DIAGNOSIS — K219 Gastro-esophageal reflux disease without esophagitis: Secondary | ICD-10-CM

## 2017-08-25 DIAGNOSIS — C3492 Malignant neoplasm of unspecified part of left bronchus or lung: Secondary | ICD-10-CM

## 2017-08-25 MED ORDER — AZITHROMYCIN 250 MG PO TABS
ORAL_TABLET | ORAL | 0 refills | Status: DC
Start: 2017-08-25 — End: 2017-09-08

## 2017-08-25 NOTE — Patient Instructions (Addendum)
Take over-the-counter expectorants and cough medications such as  Mucinex DM.  Call if there is no improvement in 5 to 7 days or if  you develop worsening cough, fever, or new symptoms, such as shortness of breath or chest pain.  Hydrate and Humidify  Drink enough water to keep your urine clear or pale yellow. Staying hydrated will help to thin your mucus.  Use a cool mist humidifier to keep the humidity level in your home above 50%.  Inhale steam for 10-15 minutes, 3-4 times a day or as told by your health care provider. You can do this in the bathroom while a hot shower is running.  Limit your exposure to cool or dry air. Rest  Rest as much as possible.

## 2017-08-25 NOTE — Progress Notes (Signed)
Subjective:    Patient ID: Jason Summers, male    DOB: 09-02-1931, 84 y.o.   MRN: [id number]  HPI  84 year old patient who has a history of non-small cell lung cancer of the left lung.  He has had prior chemoradiation and presently is under observation only For the past week he has had increasing cough congestion rhinorrhea and more fatigue.  He has had increasing cough for the past week and has had some left chest wall pain that has resolved.  He has been using ibuprofen and Mucinex.  No documented fever no wheezing  Past Medical History:  Diagnosis Date  . Arthritis   . Cancer ([place])    basal cell on right temple  . Hemorrhoids   . Lateral epicondylitis (tennis elbow)   . Non-small cell lung cancer (NSCLC) ([place]) dx'f 07/30/15  . Odynophagia 11/06/2015  . Spondylosis of cervical joint      Social History   Socioeconomic History  . Marital status: Married    Spouse name: Not on file  . Number of children: Not on file  . Years of education: Not on file  . Highest education level: Not on file  Social Needs  . Financial resource strain: Not on file  . Food insecurity - worry: Not on file  . Food insecurity - inability: Not on file  . Transportation needs - medical: Not on file  . Transportation needs - non-medical: Not on file  Occupational History  . Occupation: Retired     Comment: Games developer  Tobacco Use  . Smoking status: Former Smoker    Packs/day: 0.20    Years: 62.00    Pack years: 12.40    Types: Cigarettes    Last attempt to quit: 03/10/2011    Years since quitting: 6.4  . Smokeless tobacco: Never Used  . Tobacco comment: smokes 1 ciagettes occasionally  Substance and Sexual Activity  . Alcohol use: Yes    Alcohol/week: 4.2 oz    Types: 7 Glasses of wine per week    Comment: 1-2 glasses of wine a day  . Drug use: No  . Sexual activity: Yes  Other Topics Concern  . Not on file  Social History Narrative  . Not on file    Past Surgical History:  Procedure  Laterality Date  . COLONOSCOPY  2003  . KNEE ARTHROSCOPY  2006   right    Family History  Problem Relation Age of Onset  . Hypertension Mother   . Heart disease Mother   . Coronary artery disease Unknown     Allergies  Allergen Reactions  . Gentamicin Other (See Comments)    Made eyes red, swollen, hot feeling  . Loteprednol-Tobramycin     Other reaction(s): Other (See Comments) Made eyes Red, Swollen, Red, Warm feeling     Current Outpatient Medications on File Prior to Visit  Medication Sig Dispense Refill  . Cholecalciferol (VITAMIN D3) 50000 units TABS Take 50,000 Units once a week by mouth. Then take 50,000 monthly after 12 weeks. 51 tablet 0  . esomeprazole (NEXIUM) 40 MG capsule Take 1 capsule (40 mg total) by mouth daily at 12 noon. 90 capsule 3  . fish oil-omega-3 fatty acids 1000 MG capsule Take 2 g by mouth daily.     Marland Kitchen glucosamine-chondroitin 500-400 MG tablet Take 1 tablet by mouth daily. Reported on 09/11/2015    . aspirin 81 MG EC tablet Take 81 mg by mouth every other day. Reported on 10/16/2015  No current facility-administered medications on file prior to visit.     BP 110/60 (BP Location: Right Arm, Patient Position: Sitting, Cuff Size: Large)   Pulse (!) 56   Temp 97.8 F (36.6 C) (Oral)   Wt 170 lb (77.1 kg)   SpO2 98%   BMI 26.63 kg/m     Review of Systems  Constitutional: Positive for activity change, appetite change and fatigue. Negative for chills and fever.  HENT: Positive for congestion, postnasal drip and rhinorrhea. Negative for dental problem, ear pain, hearing loss, sore throat, tinnitus, trouble swallowing and voice change.   Eyes: Negative for pain, discharge and visual disturbance.  Respiratory: Positive for choking. Negative for cough, chest tightness, wheezing and stridor.   Cardiovascular: Positive for chest pain. Negative for palpitations and leg swelling.  Gastrointestinal: Negative for abdominal distention, abdominal pain, blood  in stool, constipation, diarrhea, nausea and vomiting.  Genitourinary: Negative for difficulty urinating, discharge, flank pain, genital sores, hematuria and urgency.  Musculoskeletal: Negative for arthralgias, back pain, gait problem, joint swelling, myalgias and neck stiffness.  Skin: Negative for rash.  Neurological: Negative for dizziness, syncope, speech difficulty, weakness, numbness and headaches.  Hematological: Negative for adenopathy. Does not bruise/bleed easily.  Psychiatric/Behavioral: Negative for behavioral problems and dysphoric mood. The patient is not nervous/anxious.        Objective:   Physical Exam  Constitutional: He is oriented to person, place, and time. He appears well-developed. No distress.  Afebrile Slightly weak but in no acute distress Blood pressure low normal Pulse 56 O2 saturation 98%  HENT:  Head: Normocephalic.  Right Ear: External ear normal.  Left Ear: External ear normal.  Eyes: Conjunctivae and EOM are normal.  Neck: Normal range of motion.  Cardiovascular: Normal rate and normal heart sounds.  Pulmonary/Chest: Effort normal.  Scattered rales rhonchi and minimal wheezing involving the left lower lung field  Abdominal: Bowel sounds are normal.  Musculoskeletal: Normal range of motion. He exhibits no edema or tenderness.  Neurological: He is alert and oriented to person, place, and time.  Psychiatric: He has a normal mood and affect. His behavior is normal.          Assessment & Plan:   URI in a patient with non-small cell lung cancer and emphysema.  Will treat with azithromycin continue expectorants and hydration.  Will report any clinical worsening will use albuterol as needed  Pneumovax booster at the time of his annual exam in 6 months  [id number],PETER Pilar Plate

## 2017-08-31 ENCOUNTER — Encounter (HOSPITAL_COMMUNITY): Payer: Self-pay

## 2017-08-31 DIAGNOSIS — J439 Emphysema, unspecified: Secondary | ICD-10-CM

## 2017-08-31 NOTE — Addendum Note (Signed)
Encounter addended by: Gaspar Bidding on: 08/31/2017 8:34 AM  Actions taken: Visit Navigator Flowsheet section accepted

## 2017-08-31 NOTE — Progress Notes (Signed)
Discharge Progress Report  Patient Details  Name: Jason Summers MRN: [id number] Date of Birth: Feb 25, 1932 Referring Provider:     Pulmonary Rehab Walk Test from 04/21/2017 in [facility]  Referring Provider  Dr. Chase Caller       Number of Visits: 22  Reason for Discharge:  Patient reached a stable level of exercise. Patient independent in their exercise. Patient has met program and personal goals.  Smoking History:  Social History   Tobacco Use  Smoking Status Former Smoker  . Packs/day: 0.20  . Years: 62.00  . Pack years: 12.40  . Types: Cigarettes  . Last attempt to quit: 03/10/2011  . Years since quitting: 6.4  Smokeless Tobacco Never Used  Tobacco Comment   smokes 1 ciagettes occasionally    Diagnosis:  Pulmonary emphysema, unspecified emphysema type ([place])  ADL UCSD: Pulmonary Assessment Scores    Row Name 07/30/17 1218 08/06/17 0733       ADL UCSD   ADL Phase  Exit  Exit    SOB Score total  37  -      mMRC Score   mMRC Score  16 Exit  1       Initial Exercise Prescription:   Discharge Exercise Prescription (Final Exercise Prescription Changes): Exercise Prescription Changes - 07/28/17 1200      Response to Exercise   Blood Pressure (Admit)  104/64    Blood Pressure (Exercise)  130/70    Blood Pressure (Exit)  104/60    Heart Rate (Admit)  82 bpm    Heart Rate (Exercise)  103 bpm    Heart Rate (Exit)  90 bpm    Oxygen Saturation (Admit)  98 %    Oxygen Saturation (Exercise)  92 %    Oxygen Saturation (Exit)  95 %    Rating of Perceived Exertion (Exercise)  7    Perceived Dyspnea (Exercise)  1    Duration  Progress to 45 minutes of aerobic exercise without signs/symptoms of physical distress    Intensity  THRR unchanged      Progression   Progression  Continue to progress workloads to maintain intensity without signs/symptoms of physical distress.      Resistance Training   Training Prescription  Yes    Weight   blue bands    Reps  10-15    Time  10 Minutes      Bike   Level  1.3    Minutes  17      NuStep   Level  5    Minutes  17    METs  2.9      Track   Laps  23    Minutes  17       Functional Capacity: 6 Minute Walk    Row Name 08/06/17 0731         6 Minute Walk   Phase  Discharge     Distance  1877 feet     Distance Feet Change  535 ft     Walk Time  6 minutes     # of Rest Breaks  0     MPH  3.55     METS  3.76     RPE  12     Perceived Dyspnea   1     Symptoms  No     Resting HR  88 bpm     Resting BP  104/60     Resting Oxygen Saturation  97 %     Exercise Oxygen Saturation  during 6 min walk  92 %     Max Ex. HR  112 bpm     Max Ex. BP  146/62     2 Minute Post BP  122/70       Interval HR   1 Minute HR  103     2 Minute HR  105     3 Minute HR  106     4 Minute HR  103     5 Minute HR  107     6 Minute HR  112     2 Minute Post HR  101     Interval Heart Rate?  Yes       Interval Oxygen   Interval Oxygen?  Yes     Baseline Oxygen Saturation %  97 %     1 Minute Oxygen Saturation %  96 %     1 Minute Liters of Oxygen  0 L     2 Minute Oxygen Saturation %  93 %     2 Minute Liters of Oxygen  0 L     3 Minute Oxygen Saturation %  93 %     3 Minute Liters of Oxygen  0 L     4 Minute Oxygen Saturation %  92 %     4 Minute Liters of Oxygen  0 L     5 Minute Oxygen Saturation %  92 %     5 Minute Liters of Oxygen  0 L     6 Minute Oxygen Saturation %  93 %     6 Minute Liters of Oxygen  0 L     2 Minute Post Oxygen Saturation %  96 %     2 Minute Post Liters of Oxygen  0 L        Psychological, QOL, Others - Outcomes: PHQ 2/9: Depression screen [facility] 2/9 04/20/2017 10/30/2015 10/20/2014 10/14/2013 10/14/2013  Decreased Interest - 0 0 0 0  Down, Depressed, Hopeless 0 0 0 0 0  PHQ - 2 Score 0 0 0 0 0    Quality of Life:   Personal Goals: Goals established at orientation with interventions provided to work toward goal.    Personal Goals  Discharge: Goals and Risk Factor Review    Row Name 07/20/17 1637 08/31/17 1205           Core Components/Risk Factors/Patient Goals Review   Personal Goals Review  Improve shortness of breath with ADL's;Increase knowledge of respiratory medications and ability to use respiratory devices properly.;Develop more efficient breathing techniques such as purse lipped breathing and diaphragmatic breathing and practicing self-pacing with activity.  Improve shortness of breath with ADL's;Increase knowledge of respiratory medications and ability to use respiratory devices properly.;Develop more efficient breathing techniques such as purse lipped breathing and diaphragmatic breathing and practicing self-pacing with activity.      Review  continues to tolerate workload increases. walks 24 laps around the track in 15 min. utilizes PLB independently. Patient close to meeting goals for graduation. will assess plans to continue exercise at home over the next several visits and review personal goals.  all goals met      Expected Outcomes  see admission outcomes  see admission outcomes         Exercise Goals and Review:   Nutrition & Weight - Outcomes:  Post Biometrics - 08/06/17 5015  Post  Biometrics   Grip Strength  32 kg       Nutrition:   Nutrition Discharge: Nutrition Assessments - 08/10/17 0928      Rate Your Plate Scores   Pre Score  58    Post Score  59       Education Questionnaire Score: Knowledge Questionnaire Score - 07/30/17 1217      Knowledge Questionnaire Score   Post Score  3/13

## 2017-09-08 ENCOUNTER — Ambulatory Visit (INDEPENDENT_AMBULATORY_CARE_PROVIDER_SITE_OTHER): Payer: Medicare Other | Admitting: Adult Health

## 2017-09-08 ENCOUNTER — Encounter: Payer: Self-pay | Admitting: Adult Health

## 2017-09-08 VITALS — BP 112/66 | Temp 97.9°F | Wt 172.0 lb

## 2017-09-08 DIAGNOSIS — Z7689 Persons encountering health services in other specified circumstances: Secondary | ICD-10-CM

## 2017-09-08 DIAGNOSIS — H6503 Acute serous otitis media, bilateral: Secondary | ICD-10-CM

## 2017-09-08 NOTE — Patient Instructions (Signed)
It was a pleasure meeting you today   Please follow up in October for your physical. If you need anything please let me know

## 2017-09-08 NOTE — Progress Notes (Signed)
Patient presents to clinic today to establish care. He is a pleasant 84 year old male who  has a past medical history of Arthritis, Cancer ([place]), Hemorrhoids, Lateral epicondylitis (tennis elbow), Non-small cell lung cancer (NSCLC) ([place]) (dx'f 07/30/15), Odynophagia (11/06/2015), and Spondylosis of cervical joint.   He is a patient of Dr. Raliegh Ip who is transferring to me   His last CPE was in September 2018    Acute Concerns: Establish Care   He was seen by Dr. Raliegh Ip on 08/25/2017 and treated for with a zpack for increasing cough, congestion, rhinorrhea and fatigue. He reports that he is feeling improved but continues to have fatigue, a semi productive cough ( has improved) and bilateral ear pressure. Denies any fevers, sinus pain, pressure, wheezing, or SOB. He has been using Flonase   Chronic Issues: Non-Small cell cancer of left lung. - Has been through chemo and radiation. Is being followed by Dr. Julien Nordmann on an observational basis every 6 months.   GERD - Takes Nexium 40 mg every other day. Feels as though his symptoms are well controlled   Health Maintenance: Dental -- Routine Care Vision -- Routine Care  Immunizations -- UTD  Colonoscopy -- 2013 - No longer needs due to age   Care Team  - Oncology - Dr. Inda Merlin  - Pulmonary - Dr. Chase Caller -   Past Medical History:  Diagnosis Date  . Arthritis   . Cancer ([place])    basal cell on right temple  . Hemorrhoids   . Lateral epicondylitis (tennis elbow)   . Non-small cell lung cancer (NSCLC) ([place]) dx'f 07/30/15  . Odynophagia 11/06/2015  . Spondylosis of cervical joint     Past Surgical History:  Procedure Laterality Date  . COLONOSCOPY  2003  . KNEE ARTHROSCOPY  2006   right    Current Outpatient Medications on File Prior to Visit  Medication Sig Dispense Refill  . aspirin 81 MG EC tablet Take 81 mg by mouth every other day. Reported on 10/16/2015    . Cholecalciferol (VITAMIN D3) 50000 units TABS Take 50,000 Units once a week  by mouth. Then take 50,000 monthly after 12 weeks. 51 tablet 0  . esomeprazole (NEXIUM) 40 MG capsule Take 1 capsule (40 mg total) by mouth daily at 12 noon. 90 capsule 3  . fish oil-omega-3 fatty acids 1000 MG capsule Take 2 g by mouth daily.     Marland Kitchen glucosamine-chondroitin 500-400 MG tablet Take 1 tablet by mouth daily. Reported on 09/11/2015    . Vitamin D, Ergocalciferol, (DRISDOL) 50000 units CAPS capsule TK ONE C PO  WEEKLY THEN TK 1 C MONTHLY AFTER 12 WEEKS  0   No current facility-administered medications on file prior to visit.     Allergies  Allergen Reactions  . Gentamicin Other (See Comments)    Made eyes red, swollen, hot feeling  . Loteprednol-Tobramycin     Other reaction(s): Other (See Comments) Made eyes Red, Swollen, Red, Warm feeling     Family History  Problem Relation Age of Onset  . Hypertension Mother   . Heart disease Mother   . Coronary artery disease Unknown     Social History   Socioeconomic History  . Marital status: Married    Spouse name: Not on file  . Number of children: Not on file  . Years of education: Not on file  . Highest education level: Not on file  Occupational History  . Occupation: Retired     Comment: Games developer  Social Needs  . Financial resource strain: Not on file  . Food insecurity:    Worry: Not on file    Inability: Not on file  . Transportation needs:    Medical: Not on file    Non-medical: Not on file  Tobacco Use  . Smoking status: Former Smoker    Packs/day: 0.20    Years: 62.00    Pack years: 12.40    Types: Cigarettes    Last attempt to quit: 03/10/2011    Years since quitting: 6.5  . Smokeless tobacco: Never Used  . Tobacco comment: smokes 1 ciagettes occasionally  Substance and Sexual Activity  . Alcohol use: Yes    Alcohol/week: 4.2 oz    Types: 7 Glasses of wine per week    Comment: 1-2 glasses of wine a day  . Drug use: No  . Sexual activity: Yes  Lifestyle  . Physical activity:    Days per week: Not  on file    Minutes per session: Not on file  . Stress: Not on file  Relationships  . Social connections:    Talks on phone: Not on file    Gets together: Not on file    Attends religious service: Not on file    Active member of club or organization: Not on file    Attends meetings of clubs or organizations: Not on file    Relationship status: Not on file  . Intimate partner violence:    Fear of current or ex partner: Not on file    Emotionally abused: Not on file    Physically abused: Not on file    Forced sexual activity: Not on file  Other Topics Concern  . Not on file  Social History Narrative  . Not on file    Review of Systems  Constitutional: Positive for malaise/fatigue. Negative for chills and fever.  HENT: Positive for ear pain. Negative for congestion, ear discharge, sinus pain and sore throat.   Eyes: Negative.   Respiratory: Positive for cough and sputum production. Negative for shortness of breath, wheezing and stridor.   Cardiovascular: Negative.   Genitourinary: Negative.   Musculoskeletal: Negative.   Skin: Negative.   Neurological: Negative.   Psychiatric/Behavioral: Negative.   All other systems reviewed and are negative.     BP 112/66 (BP Location: Left Arm)   Temp 97.9 F (36.6 C) (Oral)   Wt 172 lb (78 kg)   BMI 26.94 kg/m   Physical Exam  Constitutional: He is well-developed, well-nourished, and in no distress.  HENT:  Right Ear: Hearing and external ear normal. No swelling or tenderness. Tympanic membrane is bulging. Tympanic membrane is not erythematous.  Left Ear: Hearing, external ear and ear canal normal. No swelling or tenderness. Tympanic membrane is bulging. Tympanic membrane is not erythematous.  Nose: Nose normal. Right sinus exhibits no maxillary sinus tenderness and no frontal sinus tenderness. Left sinus exhibits no maxillary sinus tenderness and no frontal sinus tenderness.  Mouth/Throat: Uvula is midline, oropharynx is clear and  moist and mucous membranes are normal.  Neck: Neck supple. No thyromegaly present.  Cardiovascular: Normal rate, regular rhythm, normal heart sounds and intact distal pulses. Exam reveals no gallop and no friction rub.  No murmur heard. Pulmonary/Chest: Effort normal and breath sounds normal. No respiratory distress. He has no wheezes. He has no rales. He exhibits no tenderness.  Lymphadenopathy:    He has no cervical adenopathy.  Neurological: He is alert. Gait normal.  Skin: Skin  is warm and dry. No rash noted. He is not diaphoretic. No erythema. No pallor.  Psychiatric: Mood, memory, affect and judgment normal.  Nursing note and vitals reviewed.      Recent Results (from the past 2160 hour(s))  Vitamin D (25 hydroxy)     Status: None   Collection Time: 06/29/17 12:48 PM  Result Value Ref Range   VITD 37.26 30.00 - 100.00 ng/mL  CBC with Differential     Status: None   Collection Time: 08/06/17  8:37 AM  Result Value Ref Range   WBC 6.3 4.0 - 10.3 K/uL   RBC 4.94 4.20 - 5.82 MIL/uL   Hemoglobin 14.3 13.0 - 17.1 g/dL   HCT 43.6 38.4 - 49.9 %   MCV 88.3 79.3 - 98.0 fL   MCH 28.9 27.2 - 33.4 pg   MCHC 32.8 32.0 - 36.0 g/dL   RDW 14.1 11.0 - 14.6 %   Platelets 151 140 - 400 K/uL   Neutrophils Relative % 71 %   Neutro Abs 4.4 1.5 - 6.5 K/uL   Lymphocytes Relative 16 %   Lymphs Abs 1.0 0.9 - 3.3 K/uL   Monocytes Relative 11 %   Monocytes Absolute 0.7 0.1 - 0.9 K/uL   Eosinophils Relative 2 %   Eosinophils Absolute 0.1 0.0 - 0.5 K/uL   Basophils Relative 0 %   Basophils Absolute 0.0 0.0 - 0.1 K/uL    Comment: Performed at [facility], [address] [place], [place] [address]  Comprehensive metabolic panel     Status: None   Collection Time: 08/06/17  8:37 AM  Result Value Ref Range   Sodium 138 136 - 145 mmol/L   Potassium 4.6 3.5 - 5.1 mmol/L   Chloride 105 98 - 109 mmol/L   CO2 24 22 - 29 mmol/L   Glucose, Bld 98 70 - 140 mg/dL   BUN 16 7 -  26 mg/dL   Creatinine, Ser 0.88 0.70 - 1.30 mg/dL   Calcium 9.2 8.4 - 10.4 mg/dL   Total Protein 7.4 6.4 - 8.3 g/dL   Albumin 3.5 3.5 - 5.0 g/dL   AST 15 5 - 34 U/L   ALT 11 0 - 55 U/L   Alkaline Phosphatase 81 40 - 150 U/L   Total Bilirubin 0.6 0.2 - 1.2 mg/dL   GFR calc non Af Amer >60 >60 mL/min   GFR calc Af Amer >60 >60 mL/min    Comment: (NOTE) The eGFR has been calculated using the CKD EPI equation. This calculation has not been validated in all clinical situations. eGFR's persistently <60 mL/min signify possible Chronic Kidney Disease.    Anion gap 9 3 - 11    Comment: Performed at [facility], [address] [place], [place] [address]    Assessment/Plan: 1. Encounter to establish care - Follow up in September/Oct for CPE  - Follow up sooner if needed  2. Non-recurrent acute serous otitis media of both ears - Continue to use Flonase  - Can use Afrin as directed for no longer than 3 days  - Follow up if not resolved in one week   [name], NP

## 2017-10-19 ENCOUNTER — Other Ambulatory Visit: Payer: Self-pay | Admitting: Neurology

## 2017-10-28 ENCOUNTER — Encounter: Payer: Self-pay | Admitting: Internal Medicine

## 2017-10-28 ENCOUNTER — Telehealth: Payer: Self-pay | Admitting: Adult Health

## 2017-10-28 ENCOUNTER — Ambulatory Visit (INDEPENDENT_AMBULATORY_CARE_PROVIDER_SITE_OTHER): Payer: Medicare Other | Admitting: Internal Medicine

## 2017-10-28 VITALS — BP 122/70 | HR 83 | Ht 67.0 in | Wt 171.0 lb

## 2017-10-28 DIAGNOSIS — J439 Emphysema, unspecified: Secondary | ICD-10-CM

## 2017-10-28 DIAGNOSIS — R5382 Chronic fatigue, unspecified: Secondary | ICD-10-CM

## 2017-10-28 DIAGNOSIS — E559 Vitamin D deficiency, unspecified: Secondary | ICD-10-CM

## 2017-10-28 DIAGNOSIS — Z7189 Other specified counseling: Secondary | ICD-10-CM

## 2017-10-28 DIAGNOSIS — Z7184 Encounter for health counseling related to travel: Secondary | ICD-10-CM

## 2017-10-28 DIAGNOSIS — R0602 Shortness of breath: Secondary | ICD-10-CM

## 2017-10-28 NOTE — Progress Notes (Addendum)
Subjective:     Patient ID: Jason Summers, male   DOB: Apr 24, 1932, 84 y.o.   MRN: [id number]  HPI  IOV 12/16/2016  Chief Complaint  Patient presents with  . Acute Visit    Pt c/o chest tightness, mid back pain, and right lateral rib pain when pt takes a deep breath in. Pt also c/o mild dry cough with some chest congestion. Pt denies f/c/s.     84 year old male with stage III non-small cell lung cancer and resultant radiation related pleural effusion. This supposed to go to [place] his native country in June 2018 but he canceled because of his declining health. He tells me that his functional status is pretty good. His appetite is pretty good. However he is having worsening lower thoracic back pain. He saw my colleague approximately one month ago. Chest x-ray was unchanged. He was advised nonsteroidal anti-inflammatory drugs and Tylenol for pain relief. But now the pain has broken through. The pain is worse. It is present in the midthoracic right infra-axillary and lower anterior chest area. It gets worse with inspiration. It is severe. He feels he might need something stronger for pain relief. There is no neurologic deficit. There is no constipation. His effort tolerance is okay with there is slight increased fatigue.   OV 03/27/2017  Chief Complaint  Patient presents with  . Follow-up    Pt still has some pain in his back but it is better than it ws at last visit. Pt has noticed that he will occ. lose his voice and he has become weak, especially in the mornings. [name] states that pt has been taking some OTC for a cough and states that occ. it will be hard for him to breathe and does have occ. CP.    84 year old male with non-small cell lung cancer on observation therapy. I last saw him in July 2018. At that time he was bothered by left-sided chest pain. There was concern about cancer recurrence but a PET scan at that time showed improvement. Since then he's been on observation therapy.He  now presents with his wife. He tells me that the pain is not much of an issue. He only has very mild shortness of breath. But they both tell me that he is extremely fatigued. Particularly early in the morning. He has insomnia. Wife does think that he is deconditioned. Last lab work in August 2018 was fine. Has no symptoms of COPD exacerbation. He is up-to-date with his flu shot.he has had some headaches and neurology for that He might be depressed Walking desaturation test 185 feet 3 laps on room air: He finished it without any problems other than mild shortness of breath. Resting heart rate was 87/m. Final heart rate was 98/m. Resting pulse ox 100% final pulse ox 98%.   OV 06/29/2017  Chief Complaint  Patient presents with  . Follow-up    Pt and his wife states he is doing much better since last visit. Pt has been going to PT which has helped him gain strengh back, walking better, and brething better.   He has mild emphysema and this is follow-up for that.  He is here with his wife.  They tell me that he is not taking his inhalers.  Because it really does not benefit him.  The bigger issues that have been recently dealing with other issues of fatigue and low motivation and physical deconditioning.  Therefore at last visit we put him into pulmonary rehabilitation.  We checked a  lot of his blood work it ended up that his vitamin D was very low.  Put him on vitamin D replacement.  Now he reports that with both vitamin D replacement and with pulmonary rehabilitation he has significant improvement in his fatigue and energy levels.  He feels back to baseline.  In particular tl pulmonary rehabilitation has helped him.  They both are worried about the extent of lung cancer and the odds of recurrence.  The having follow-up CT scan with Dr. Julien Nordmann in a month or 2.  They feel that if his physical condition will allow and the CT scan does not show any recurrence he would want to visit [place] in the summer 2019.   Otherwise feeling good.   OV 10/28/2017  Chief Complaint  Patient presents with  . Follow-up    Pt states in the mornings it is hard to breathe. States he believes the fatigue has become better.   Jason Summers presents for follow-up with his wife.  The issues to be discussed are emphysema, shortness of breath and fatigue with vitamin D deficiency in the setting of lung cancer on observation therapy.  In addition there is travel advice coming up in 2 weeks he is going to [place] for a few months.  Overall stable.  He is no longer doing pulmonary rehab.  He uses only albuterol as needed because he does not want to do maintenance inhalers.  We told him to do his vitamin D once a month but I am not so sure as to what regimen he is following right now.  In the interim it appears that exertional dyspnea slightly worse although the fatigue component might be the same.  Wife feels is because he is anxious and because he wants to do more than his body is allowing him to do.  He does not follow the pulmonary rehabilitation home prescription accurately.  I reviewed the old chart found that Dr. Julien Nordmann has placed him on observation therapy after seeing him in March 2019.  Apparently there is a scan coming up in September 2019.  He clearly wants to be able to do more.  Walking desaturation test he got tachycardic and did not desaturate below 97%.  He is going to [place] early part of June 2019.  Is a long flight to [place].  Given the persistence of his dyspnea we did a deep dive with an objective dyspnea questionnaire.  It appears that the worst his dyspnea is level 3 out of 5.  He is level 3 for walking on a level with others of his age and walking uphill, picking up and straightening.  He is a level 2 dyspnea walking upstairs and washing the car.  He is a level 1 dyspnea for dressing watering the lawn sexual activities.  But is a level 0 dyspnea while eating or standing up from a chair of brushing teeth or showering  or shaving.  It is associated with fatigue and arthralgia     DIAGNOSIS: Stage IIIA (T3, N2, M0) non-small cell lung cancer, poorly differentiated squamous cell carcinoma diagnosed in February 2017 with PDL 1 expression of 35%, presented with large left lower lobe lung mass in addition to left upper lobe lung nodule and mediastinal lymphadenopathy. PRIOR THERAPY: Course of concurrent chemoradiation with weekly carboplatin for AUC of 2 and paclitaxel 45 MG/M2. Status post 7 cycles, last dose was given 11/19/2015 with partial response.  CURRENT THERAPY: Observation.   Simple office walk 185 feet x  3  laps goal with forehead probe 10/28/2017   O2 used Room   Number laps completed 3  Comments about pace Good pace  Resting Pulse Ox/HR 100% and 83/min  Final Pulse Ox/HR 97% and 96/min  Desaturated </= 88% no  Desaturated <= 3% points yds  Got Tachycardic >/= 90/min yes  Symptoms at end of test x  Miscellaneous comments x      has a past medical history of Arthritis, Cancer ([place]), Hemorrhoids, Lateral epicondylitis (tennis elbow), Non-small cell lung cancer (NSCLC) ([place]) (dx'f 07/30/15), Odynophagia (11/06/2015), and Spondylosis of cervical joint.   reports that he quit smoking about 6 years ago. His smoking use included cigarettes. He has a 12.40 pack-year smoking history. He has never used smokeless tobacco.  Past Surgical History:  Procedure Laterality Date  . COLONOSCOPY  2003  . KNEE ARTHROSCOPY  2006   right    Allergies  Allergen Reactions  . Gentamicin Other (See Comments)    Made eyes red, swollen, hot feeling  . Loteprednol-Tobramycin     Other reaction(s): Other (See Comments) Made eyes Red, Swollen, Red, Warm feeling     Immunization History  Administered Date(s) Administered  . Influenza Split 03/19/2011, 03/12/2012  . Influenza Whole 06/09/1997, 04/22/2007, 03/14/2008, 03/28/2009, 05/09/2010  . Influenza, High Dose Seasonal PF 04/14/2013, 04/11/2015, 03/02/2017  .  Influenza,inj,Quad PF,6+ Mos 05/07/2016  . Influenza-Unspecified 04/10/2014  . Pneumococcal Conjugate-13 10/14/2013  . Pneumococcal Polysaccharide-23 06/09/2004, 10/04/2012  . Td 06/09/2005  . Zoster 02/04/2015    Family History  Problem Relation Age of Onset  . Hypertension Mother   . Heart disease Mother   . Coronary artery disease Unknown      Current Outpatient Medications:  .  Cholecalciferol (VITAMIN D3) 50000 units TABS, Take 50,000 Units once a week by mouth. Then take 50,000 monthly after 12 weeks., Disp: 51 tablet, Rfl: 0 .  esomeprazole (NEXIUM) 40 MG capsule, Take 1 capsule (40 mg total) by mouth daily at 12 noon., Disp: 90 capsule, Rfl: 3 .  fish oil-omega-3 fatty acids 1000 MG capsule, Take 2 g by mouth daily. , Disp: , Rfl:  .  glucosamine-chondroitin 500-400 MG tablet, Take 1 tablet by mouth daily. Reported on 09/11/2015, Disp: , Rfl:  .  Vitamin D, Ergocalciferol, (DRISDOL) 50000 units CAPS capsule, TK ONE C PO  WEEKLY THEN TK 1 C MONTHLY AFTER 12 WEEKS, Disp: , Rfl: 0 .  aspirin 81 MG EC tablet, Take 81 mg by mouth every other day. Reported on 10/16/2015, Disp: , Rfl:    Review of Systems     Objective:   Physical Exam  Constitutional: He is oriented to person, place, and time. He appears well-developed and well-nourished. No distress.  HENT:  Head: Normocephalic and atraumatic.  Right Ear: External ear normal.  Left Ear: External ear normal.  Mouth/Throat: Oropharynx is clear and moist. No oropharyngeal exudate.  Eyes: Pupils are equal, round, and reactive to light. Conjunctivae and EOM are normal. Right eye exhibits no discharge. Left eye exhibits no discharge. No scleral icterus.  Neck: Normal range of motion. Neck supple. No JVD present. No tracheal deviation present. No thyromegaly present.  Cardiovascular: Normal rate, regular rhythm and intact distal pulses. Exam reveals no gallop and no friction rub.  No murmur heard. Pulmonary/Chest: Effort normal and  breath sounds normal. No respiratory distress. He has no wheezes. He has no rales. He exhibits no tenderness.  Abdominal: Soft. Bowel sounds are normal. He exhibits no distension and no mass. There  is no tenderness. There is no rebound and no guarding.  Musculoskeletal: Normal range of motion. He exhibits no edema or tenderness.  Lymphadenopathy:    He has no cervical adenopathy.  Neurological: He is alert and oriented to person, place, and time. He has normal reflexes. No cranial nerve deficit. Coordination normal.  Skin: Skin is warm and dry. No rash noted. He is not diaphoretic. No erythema. No pallor.  Psychiatric: He has a normal mood and affect. His behavior is normal. Judgment and thought content normal.  Nursing note and vitals reviewed.    Vitals:   10/28/17 1102  BP: 122/70  Pulse: 83  SpO2: 97%  Weight: 171 lb (77.6 kg)  Height: 5' 7" (1.702 m)    Estimated body mass index is 26.78 kg/m as calculated from the following:   Height as of this encounter: 5' 7" (1.702 m).   Weight as of this encounter: 171 lb (77.6 kg).     Assessment:       ICD-10-CM   1. Pulmonary emphysema, unspecified emphysema type ([place]) J43.9   2. Shortness of breath R06.02   3. Chronic fatigue R53.82   4. Vitamin D deficiency E55.9   5. Travel advice encounter Z71.89        Plan:     Pulmonary emphysema, unspecified emphysema type ([place]) Shortness of breath   - too shortness of breath is worse; your o2 level it seems ok - fill out symptom questionnair - recommend you give daily spiriva a trial for 1 month - take samples and scrip  - albuterol as needed  Chronic fatigue Vitamin D deficiency  - recheck Vit D level and will call you with results   Travel Advice - keep yourself hydrated in plane ; avoid alcohol  - keep your legs moving  - after landing any leg swelling that persists past a day, see a doctor  Followup Aug-SEpt 2019 with Dr Chase Caller    Dr. Brand Males, M.D.,  [facility]C.P Pulmonary and Critical Care Medicine Staff Physician, [place] Director - Interstitial Lung Disease  Program  Pulmonary [place] at [place], [place], [address]  Pager: [phone], If no answer or between  15:00h - 7:00h: call [phone] Telephone: [phone]

## 2017-10-28 NOTE — Patient Instructions (Addendum)
  Pulmonary emphysema, unspecified emphysema type ([place]) Shortness of breath   - too shortness of breath is worse; your o2 level it seems ok - fill out symptom questionnair - recommend you give daily spiriva a trial for 1 month - take samples and scrip  - albuterol as needed  Chronic fatigue Vitamin D deficiency  - recheck Vit D level and will call you with results   Travel Advice - keep yourself hydrated in plane ; avoid alcohol  - keep your legs moving  - after landing any leg swelling that persists past a day, see a doctor  Followup Aug-SEpt 2019 with Dr Chase Caller

## 2017-10-28 NOTE — Telephone Encounter (Signed)
Copied from [place] [id number]. Topic: Quick Communication - See Telephone Encounter >> Oct 28, 2017  3:46 PM Conception Chancy, NT wrote: CRM for notification. See Telephone encounter for: 10/28/17.  Patient spouse is calling and states that the patient needs something to help relax him. She would like a refill on LORazepam (ATIVAN) 0.5 MG tablet. I informed her looks like it has not been filled since  2013. Please advise.  [facility] [address] [place] [address] Phone: [phone] Fax: [phone]

## 2017-10-29 NOTE — Telephone Encounter (Signed)
I would like to be able to see him for this issue

## 2017-10-29 NOTE — Telephone Encounter (Signed)
Spoke to the pt.  DPR not on file to speak to his wife.  Informed the pt that he will need to be seen in the office in order to get rx for controlled substance.  Pt did not want to schedule at this time.  Will call back if needed.  No further action required.

## 2018-01-27 ENCOUNTER — Telehealth: Payer: Self-pay | Admitting: Internal Medicine

## 2018-01-27 NOTE — Telephone Encounter (Signed)
Called patient, unable to reach left message to give us a call back. 

## 2018-01-28 NOTE — Telephone Encounter (Signed)
ATC Mickel Baas, received busy dial x3. Will call back

## 2018-01-29 ENCOUNTER — Other Ambulatory Visit: Payer: Self-pay

## 2018-01-29 MED ORDER — TIOTROPIUM BROMIDE MONOHYDRATE 2.5 MCG/ACT IN AERS: 2.0000 | INHALATION_SPRAY | Freq: Every day | RESPIRATORY_TRACT | 2 refills | Status: DC

## 2018-02-01 NOTE — Telephone Encounter (Signed)
Left message for Jason Summers to call back.

## 2018-02-02 NOTE — Telephone Encounter (Signed)
Called and spoke with pt's spouse Mickel Baas to see if she wanted me to send a new Rx of the Spiriva in for pt. Per Mickel Baas, hold off until pt's next appt due to pt still having some samples left.   Pt wanted to wait until next OV with MR to see if he wanted him to remain on the spiriva. After next OV, if MR wanted pt to remain on Spiriva, we could then sent another Rx.  Nothing further needed.

## 2018-02-12 ENCOUNTER — Inpatient Hospital Stay: Payer: Medicare Other | Attending: Internal Medicine

## 2018-02-12 ENCOUNTER — Encounter (HOSPITAL_COMMUNITY): Payer: Self-pay

## 2018-02-12 ENCOUNTER — Ambulatory Visit (HOSPITAL_COMMUNITY)
Admission: RE | Admit: 2018-02-12 | Discharge: 2018-02-12 | Disposition: A | Discharge status: Home / Self Care | Payer: Medicare Other | Source: Ambulatory Visit | Attending: Internal Medicine | Admitting: Internal Medicine

## 2018-02-12 DIAGNOSIS — X58XXXA Exposure to other specified factors, initial encounter: Secondary | ICD-10-CM | POA: Insufficient documentation

## 2018-02-12 DIAGNOSIS — K208 Other esophagitis: Secondary | ICD-10-CM | POA: Diagnosis not present

## 2018-02-12 DIAGNOSIS — Z923 Personal history of irradiation: Secondary | ICD-10-CM | POA: Diagnosis not present

## 2018-02-12 DIAGNOSIS — R599 Enlarged lymph nodes, unspecified: Secondary | ICD-10-CM | POA: Insufficient documentation

## 2018-02-12 DIAGNOSIS — M129 Arthropathy, unspecified: Secondary | ICD-10-CM | POA: Insufficient documentation

## 2018-02-12 DIAGNOSIS — C3490 Malignant neoplasm of unspecified part of unspecified bronchus or lung: Secondary | ICD-10-CM

## 2018-02-12 DIAGNOSIS — I700 Atherosclerosis of aorta: Secondary | ICD-10-CM | POA: Diagnosis not present

## 2018-02-12 DIAGNOSIS — C3482 Malignant neoplasm of overlapping sites of left bronchus and lung: Secondary | ICD-10-CM | POA: Insufficient documentation

## 2018-02-12 DIAGNOSIS — R590 Localized enlarged lymph nodes: Secondary | ICD-10-CM | POA: Insufficient documentation

## 2018-02-12 DIAGNOSIS — Z79899 Other long term (current) drug therapy: Secondary | ICD-10-CM | POA: Insufficient documentation

## 2018-02-12 DIAGNOSIS — J439 Emphysema, unspecified: Secondary | ICD-10-CM | POA: Insufficient documentation

## 2018-02-12 DIAGNOSIS — R1310 Dysphagia, unspecified: Secondary | ICD-10-CM | POA: Insufficient documentation

## 2018-02-12 DIAGNOSIS — Z85828 Personal history of other malignant neoplasm of skin: Secondary | ICD-10-CM | POA: Diagnosis not present

## 2018-02-12 DIAGNOSIS — M4854XA Collapsed vertebra, not elsewhere classified, thoracic region, initial encounter for fracture: Secondary | ICD-10-CM | POA: Diagnosis not present

## 2018-02-12 DIAGNOSIS — Z9221 Personal history of antineoplastic chemotherapy: Secondary | ICD-10-CM | POA: Insufficient documentation

## 2018-02-12 DIAGNOSIS — J90 Pleural effusion, not elsewhere classified: Secondary | ICD-10-CM | POA: Insufficient documentation

## 2018-02-12 DIAGNOSIS — M47812 Spondylosis without myelopathy or radiculopathy, cervical region: Secondary | ICD-10-CM | POA: Diagnosis not present

## 2018-02-12 LAB — CBC WITH DIFFERENTIAL (CANCER CENTER ONLY)
BASOS PCT: 1 %
Basophils Absolute: 0 10*3/uL (ref 0.0–0.1)
EOS ABS: 0.1 10*3/uL (ref 0.0–0.5)
Eosinophils Relative: 2 %
HEMATOCRIT: 39.5 % (ref 38.4–49.9)
HEMOGLOBIN: 13.1 g/dL (ref 13.0–17.1)
Lymphocytes Relative: 16 %
Lymphs Abs: 0.9 10*3/uL (ref 0.9–3.3)
MCH: 28.7 pg (ref 27.2–33.4)
MCHC: 33.1 g/dL (ref 32.0–36.0)
MCV: 86.6 fL (ref 79.3–98.0)
Monocytes Absolute: 0.6 10*3/uL (ref 0.1–0.9)
Monocytes Relative: 11 %
NEUTROS ABS: 4.2 10*3/uL (ref 1.5–6.5)
NEUTROS PCT: 70 %
Platelet Count: 214 10*3/uL (ref 140–400)
RBC: 4.56 MIL/uL (ref 4.20–5.82)
RDW: 14.3 % (ref 11.0–14.6)
WBC: 5.9 10*3/uL (ref 4.0–10.3)

## 2018-02-12 LAB — CMP (CANCER CENTER ONLY)
ALK PHOS: 85 U/L (ref 38–126)
ALT: 8 U/L (ref 0–44)
ANION GAP: 8 (ref 5–15)
AST: 14 U/L — ABNORMAL LOW (ref 15–41)
Albumin: 3.4 g/dL — ABNORMAL LOW (ref 3.5–5.0)
BUN: 15 mg/dL (ref 8–23)
CALCIUM: 9.1 mg/dL (ref 8.9–10.3)
CHLORIDE: 104 mmol/L (ref 98–111)
CO2: 26 mmol/L (ref 22–32)
Creatinine: 0.9 mg/dL (ref 0.61–1.24)
Glucose, Bld: 105 mg/dL — ABNORMAL HIGH (ref 70–99)
Potassium: 4.7 mmol/L (ref 3.5–5.1)
SODIUM: 138 mmol/L (ref 135–145)
Total Bilirubin: 0.5 mg/dL (ref 0.3–1.2)
Total Protein: 7.5 g/dL (ref 6.5–8.1)

## 2018-02-12 MED ORDER — IOHEXOL 300 MG/ML  SOLN
75.0000 mL | Freq: Once | INTRAMUSCULAR | Status: AC | PRN
  Administered 2018-02-12: 75 mL via INTRAVENOUS

## 2018-02-17 ENCOUNTER — Telehealth: Payer: Self-pay | Admitting: Internal Medicine

## 2018-02-17 ENCOUNTER — Inpatient Hospital Stay (HOSPITAL_BASED_OUTPATIENT_CLINIC_OR_DEPARTMENT_OTHER): Payer: Medicare Other | Admitting: Internal Medicine

## 2018-02-17 ENCOUNTER — Encounter: Payer: Self-pay | Admitting: Internal Medicine

## 2018-02-17 VITALS — BP 136/82 | HR 88 | Temp 98.0°F | Resp 18 | Ht 67.0 in | Wt 169.5 lb

## 2018-02-17 DIAGNOSIS — R599 Enlarged lymph nodes, unspecified: Secondary | ICD-10-CM

## 2018-02-17 DIAGNOSIS — C3492 Malignant neoplasm of unspecified part of left bronchus or lung: Secondary | ICD-10-CM

## 2018-02-17 DIAGNOSIS — Z923 Personal history of irradiation: Secondary | ICD-10-CM

## 2018-02-17 DIAGNOSIS — M47819 Spondylosis without myelopathy or radiculopathy, site unspecified: Secondary | ICD-10-CM

## 2018-02-17 DIAGNOSIS — Z79899 Other long term (current) drug therapy: Secondary | ICD-10-CM

## 2018-02-17 DIAGNOSIS — J90 Pleural effusion, not elsewhere classified: Secondary | ICD-10-CM

## 2018-02-17 DIAGNOSIS — Z9221 Personal history of antineoplastic chemotherapy: Secondary | ICD-10-CM

## 2018-02-17 DIAGNOSIS — R590 Localized enlarged lymph nodes: Secondary | ICD-10-CM

## 2018-02-17 DIAGNOSIS — R1310 Dysphagia, unspecified: Secondary | ICD-10-CM

## 2018-02-17 DIAGNOSIS — Z85828 Personal history of other malignant neoplasm of skin: Secondary | ICD-10-CM

## 2018-02-17 DIAGNOSIS — K208 Other esophagitis: Secondary | ICD-10-CM | POA: Diagnosis not present

## 2018-02-17 DIAGNOSIS — J439 Emphysema, unspecified: Secondary | ICD-10-CM

## 2018-02-17 DIAGNOSIS — M129 Arthropathy, unspecified: Secondary | ICD-10-CM

## 2018-02-17 DIAGNOSIS — C3482 Malignant neoplasm of overlapping sites of left bronchus and lung: Secondary | ICD-10-CM

## 2018-02-17 DIAGNOSIS — C3490 Malignant neoplasm of unspecified part of unspecified bronchus or lung: Secondary | ICD-10-CM

## 2018-02-17 NOTE — Progress Notes (Signed)
[facility] [phone]   [phone]  OFFICE PROGRESS NOTE  Jason Peng, NP [place] [address]  DIAGNOSIS: Stage IIIA (T3, N2, M0) non-small cell lung cancer, poorly differentiated squamous cell carcinoma diagnosed in February 2017 with PDL 1 expression of 35%, presented with large left lower lobe lung mass in addition to left upper lobe lung nodule and mediastinal lymphadenopathy.  PRIOR THERAPY: Course of concurrent chemoradiation with weekly carboplatin for AUC of 2 and paclitaxel 45 MG/M2. Status post 7 cycles, last dose was given 11/19/2015 with partial response.  CURRENT THERAPY: Observation.  INTERVAL HISTORY: Jason Summers 84 y.o. male returns to the clinic today for follow-up visit accompanied by his wife.  The patient is feeling fine today with no specific complaints except for feeling tired in the morning he also has some hoarseness of his voice mainly in the morning.  He denied having any current chest pain but has shortness of breath with exertion with no cough or hemoptysis.  He denied having any fever or chills.  He has no nausea, vomiting, diarrhea or constipation.  He denied having any significant weight loss.  He had repeat CT scan of the chest performed recently and is here for evaluation and discussion of his discuss results.  MEDICAL HISTORY: Past Medical History:  Diagnosis Date  . Arthritis   . Cancer ([place])    basal cell on right temple  . Hemorrhoids   . Lateral epicondylitis (tennis elbow)   . Non-small cell lung cancer (NSCLC) ([place]) dx'f 07/30/15  . Odynophagia 11/06/2015  . Spondylosis of cervical joint     ALLERGIES:  is allergic to gentamicin and loteprednol-tobramycin.  MEDICATIONS:  Current Outpatient Medications  Medication Sig Dispense Refill  . Cholecalciferol (VITAMIN D3) 50000 units TABS Take 50,000 Units once a week by mouth. Then take 50,000 monthly after 12 weeks. 51 tablet 0    . esomeprazole (NEXIUM) 40 MG capsule Take 1 capsule (40 mg total) by mouth daily at 12 noon. 90 capsule 3  . fish oil-omega-3 fatty acids 1000 MG capsule Take 2 g by mouth daily.     Marland Kitchen glucosamine-chondroitin 500-400 MG tablet Take 1 tablet by mouth daily. Reported on 09/11/2015    . Tiotropium Bromide Monohydrate (SPIRIVA RESPIMAT) 2.5 MCG/ACT AERS Inhale 2 puffs into the lungs daily. 2 Inhaler 2  . Vitamin D, Ergocalciferol, (DRISDOL) 50000 units CAPS capsule TK ONE C PO  WEEKLY THEN TK 1 C MONTHLY AFTER 12 WEEKS  0   No current facility-administered medications for this visit.     SURGICAL HISTORY:  Past Surgical History:  Procedure Laterality Date  . COLONOSCOPY  2003  . KNEE ARTHROSCOPY  2006   right    REVIEW OF SYSTEMS:  A comprehensive review of systems was negative except for: Respiratory: positive for cough and dyspnea on exertion   PHYSICAL EXAMINATION: General appearance: alert, cooperative and no distress Head: Normocephalic, without obvious abnormality, atraumatic Neck: no adenopathy, no JVD, supple, symmetrical, trachea midline and thyroid not enlarged, symmetric, no tenderness/mass/nodules Lymph nodes: Cervical, supraclavicular, and axillary nodes normal. Resp: clear to auscultation bilaterally Back: symmetric, no curvature. ROM normal. No CVA tenderness. Cardio: regular rate and rhythm, S1, S2 normal, no murmur, click, rub or gallop GI: soft, non-tender; bowel sounds normal; no masses,  no organomegaly Extremities: extremities normal, atraumatic, no cyanosis or edema  ECOG PERFORMANCE STATUS: 1 - Symptomatic but completely ambulatory  Blood pressure 136/82, pulse 88,  temperature 98 F (36.7 C), temperature source Oral, resp. rate 18, height _0  (1.702 m), weight 169 lb 8 oz (76.9 kg), SpO2 96 %.  LABORATORY DATA: Lab Results  Component Value Date   WBC 5.9 02/12/2018   HGB 13.1 02/12/2018   HCT 39.5 02/12/2018   MCV 86.6 02/12/2018   PLT 214 02/12/2018       Chemistry      Component Value Date/Time   NA 138 02/12/2018 0841   NA 139 01/27/2017 0826   K 4.7 02/12/2018 0841   K 4.4 01/27/2017 0826   CL 104 02/12/2018 0841   CO2 26 02/12/2018 0841   CO2 25 01/27/2017 0826   BUN 15 02/12/2018 0841   BUN 15.5 01/27/2017 0826   CREATININE 0.90 02/12/2018 0841   CREATININE 0.9 01/27/2017 0826      Component Value Date/Time   CALCIUM 9.1 02/12/2018 0841   CALCIUM 9.1 01/27/2017 0826   ALKPHOS 85 02/12/2018 0841   ALKPHOS 89 01/27/2017 0826   AST 14 (L) 02/12/2018 0841   AST 16 01/27/2017 0826   ALT 8 02/12/2018 0841   ALT 13 01/27/2017 0826   BILITOT 0.5 02/12/2018 0841   BILITOT 0.52 01/27/2017 0826       RADIOGRAPHIC STUDIES: Ct Chest W Contrast  Result Date: 02/12/2018 CLINICAL DATA:  Non-small cell lung cancer, status post chemotherapy and XRT EXAM: CT CHEST WITH CONTRAST TECHNIQUE: Multidetector CT imaging of the chest was performed during intravenous contrast administration. CONTRAST:  49m OMNIPAQUE IOHEXOL 300 MG/ML  SOLN COMPARISON:  08/06/2017 FINDINGS: Cardiovascular: The heart is normal in size. No pericardial effusion. No evidence of thoracic aortic aneurysm. Mild atherosclerotic calcifications of the aortic arch. Three vessel coronary atherosclerosis. Mediastinum/Nodes: 14 mm short axis low right paratracheal node with preservation of the normal fatty hilum, grossly unchanged. No suspicious hilar or axillary lymphadenopathy. Visualized thyroid is unremarkable. Lungs/Pleura: Radiation changes in the medial left upper lobe and left lower lobe. Subpleural reticulation/fibrosis in the lungs bilaterally, suggesting chronic interstitial lung disease, grossly unchanged. Associated emphysematous changes. No focal consolidation. Small to moderate left pleural effusion, partially loculated, unchanged. No pneumothorax. Upper Abdomen: Visualized upper abdomen is grossly unremarkable, noting eccentric mural thrombus in the visualized  abdominal aorta. Musculoskeletal: Stable moderate compression fracture deformity at T6 with associated sclerosis, possibly pathologic. Mild sclerosis along the inferior aspect of T5, unchanged. IMPRESSION: Stable radiation changes in the left hemithorax. No findings specific for recurrent or metastatic disease. Mild mediastinal lymphadenopathy, unchanged. Stable left pleural effusion, partially loculated. Stable compression fracture deformity at T6, possibly pathologic. Aortic Atherosclerosis (ICD10-I70.0) and Emphysema (ICD10-J43.9). Electronically Signed   By: SJulian HyM.D.   On: 02/12/2018 13:07   ASSESSMENT AND PLAN:  This is a very pleasant 84years old white male with stage IIIA non-small cell lung cancer, squamous cell carcinoma. The patient completed a course of concurrent chemoradiation with weekly carboplatin and paclitaxel for 7 cycles and tolerated his treatment well except for radiation induced pneumonitis as well as odynophagia and dysphagia secondary to radiation induced esophagitis.  He has been on observation for the last 2 years and he is feeling fine. Repeat CT scan of the chest performed recently showed no concerning findings for disease progression and there was stable fracture of the T6. I recommended for the patient to continue on observation with repeat CT scan of the chest in 6 months for restaging of his disease. He was advised to call immediately if he has any concerning symptoms in  the interval. The patient voices understanding of current disease status and treatment options and is in agreement with the current care plan.  All questions were answered. The patient knows to call the clinic with any problems, questions or concerns. We can certainly see the patient much sooner if necessary.  Disclaimer: This note was dictated with voice recognition software. Similar sounding words can inadvertently be transcribed and may not be corrected upon review.

## 2018-02-17 NOTE — Telephone Encounter (Signed)
Scheduled appt per 9/11 los - gave patient AVS and calender per los.

## 2018-02-26 ENCOUNTER — Ambulatory Visit: Payer: Medicare Other | Admitting: Internal Medicine

## 2018-03-03 ENCOUNTER — Encounter: Payer: Self-pay | Admitting: Internal Medicine

## 2018-03-03 ENCOUNTER — Ambulatory Visit (INDEPENDENT_AMBULATORY_CARE_PROVIDER_SITE_OTHER): Payer: Medicare Other | Admitting: Internal Medicine

## 2018-03-03 VITALS — BP 122/72 | HR 82 | Ht 67.0 in | Wt 169.0 lb

## 2018-03-03 DIAGNOSIS — R0602 Shortness of breath: Secondary | ICD-10-CM | POA: Diagnosis not present

## 2018-03-03 DIAGNOSIS — J701 Chronic and other pulmonary manifestations due to radiation: Secondary | ICD-10-CM

## 2018-03-03 DIAGNOSIS — R5381 Other malaise: Secondary | ICD-10-CM | POA: Diagnosis not present

## 2018-03-03 DIAGNOSIS — J439 Emphysema, unspecified: Secondary | ICD-10-CM

## 2018-03-03 NOTE — Patient Instructions (Addendum)
ICD-10-CM   1. Shortness of breath R06.02   2. Pulmonary emphysema, unspecified emphysema type ([place]) J43.9   3. 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  Most of this fixed shortness of breath is because of scar tissue in the lung from radiation.  Some of the shortness of breath is from lack of physical fitness.  Only some of shortness of breath is from emphysema and this is why you have some improvement only with Spiriva  Plan -We discussed many options and we finally decided  -He will continue Spiriva daily  -Try inspiratory  muscle trainer [take picture from my CMA] -10 times each time x 2 times a day  -Exercise with daily walks as much as possible  Followup  -Return in 3-5 months for follow-up; if symptoms are not improved then we will consider pulmonary stress test  PS: Hoarseness of voice -he will continue to monitor this but if it is a problem please let me know and we will refer you to Dr. Constance Holster

## 2018-03-03 NOTE — Progress Notes (Signed)
IOV 12/16/2016  Chief Complaint  Patient presents with  . Acute Visit    Pt c/o chest tightness, mid back pain, and right lateral rib pain when pt takes a deep breath in. Pt also c/o mild dry cough with some chest congestion. Pt denies f/c/s.     84 year old male with stage III non-small cell lung cancer and resultant radiation related pleural effusion. This supposed to go to [place] his native country in June 2018 but he canceled because of his declining health. He tells me that his functional status is pretty good. His appetite is pretty good. However he is having worsening lower thoracic back pain. He saw my colleague approximately one month ago. Chest x-ray was unchanged. He was advised nonsteroidal anti-inflammatory drugs and Tylenol for pain relief. But now the pain has broken through. The pain is worse. It is present in the midthoracic right infra-axillary and lower anterior chest area. It gets worse with inspiration. It is severe. He feels he might need something stronger for pain relief. There is no neurologic deficit. There is no constipation. His effort tolerance is okay with there is slight increased fatigue.   OV 03/27/2017  Chief Complaint  Patient presents with  . Follow-up    Pt still has some pain in his back but it is better than it ws at last visit. Pt has noticed that he will occ. lose his voice and he has become weak, especially in the mornings. [name] states that pt has been taking some OTC for a cough and states that occ. it will be hard for him to breathe and does have occ. CP.    84 year old male with non-small cell lung cancer on observation therapy. I last saw him in July 2018. At that time he was bothered by left-sided chest pain. There was concern about cancer recurrence but a PET scan at that time showed improvement. Since then he's been on observation therapy.He now presents with his wife. He tells me that the pain is not much of an issue. He only has very  mild shortness of breath. But they both tell me that he is extremely fatigued. Particularly early in the morning. He has insomnia. Wife does think that he is deconditioned. Last lab work in August 2018 was fine. Has no symptoms of COPD exacerbation. He is up-to-date with his flu shot.he has had some headaches and neurology for that He might be depressed Walking desaturation test 185 feet 3 laps on room air: He finished it without any problems other than mild shortness of breath. Resting heart rate was 87/m. Final heart rate was 98/m. Resting pulse ox 100% final pulse ox 98%.   OV 06/29/2017  Chief Complaint  Patient presents with  . Follow-up    Pt and his wife states he is doing much better since last visit. Pt has been going to PT which has helped him gain strengh back, walking better, and brething better.   He has mild emphysema and this is follow-up for that.  He is here with his wife.  They tell me that he is not taking his inhalers.  Because it really does not benefit him.  The bigger issues that have been recently dealing with other issues of fatigue and low motivation and physical deconditioning.  Therefore at last visit we put him into pulmonary rehabilitation.  We checked a lot of his blood work it ended up that his vitamin D was very low.  Put him on vitamin  D replacement.  Now he reports that with both vitamin D replacement and with pulmonary rehabilitation he has significant improvement in his fatigue and energy levels.  He feels back to baseline.  In particular tl pulmonary rehabilitation has helped him.  They both are worried about the extent of lung cancer and the odds of recurrence.  The having follow-up CT scan with Dr. Julien Nordmann in a month or 2.  They feel that if his physical condition will allow and the CT scan does not show any recurrence he would want to visit [place] in the summer 2019.  Otherwise feeling good.   OV 10/28/2017  Chief Complaint  Patient presents with  . Follow-up     Pt states in the mornings it is hard to breathe. States he believes the fatigue has become better.   Victorhugo Lonardo presents for follow-up with his wife.  The issues to be discussed are emphysema, shortness of breath and fatigue with vitamin D deficiency in the setting of lung cancer on observation therapy.  In addition there is travel advice coming up in 2 weeks he is going to [place] for a few months.  Overall stable.  He is no longer doing pulmonary rehab.  He uses only albuterol as needed because he does not want to do maintenance inhalers.  We told him to do his vitamin D once a month but I am not so sure as to what regimen he is following right now.  In the interim it appears that exertional dyspnea slightly worse although the fatigue component might be the same.  Wife feels is because he is anxious and because he wants to do more than his body is allowing him to do.  He does not follow the pulmonary rehabilitation home prescription accurately.  I reviewed the old chart found that Dr. Julien Nordmann has placed him on observation therapy after seeing him in March 2019.  Apparently there is a scan coming up in September 2019.  He clearly wants to be able to do more.  Walking desaturation test he got tachycardic and did not desaturate below 97%.  He is going to [place] early part of June 2019.  Is a long flight to [place].  Given the persistence of his dyspnea we did a deep dive with an objective dyspnea questionnaire.  It appears that the worst his dyspnea is level 3 out of 5.  He is level 3 for walking on a level with others of his age and walking uphill, picking up and straightening.  He is a level 2 dyspnea walking upstairs and washing the car.  He is a level 1 dyspnea for dressing watering the lawn sexual activities.  But is a level 0 dyspnea while eating or standing up from a chair of brushing teeth or showering or shaving.  It is associated with fatigue and arthralgia     DIAGNOSIS: Stage IIIA (T3, N2,  M0) non-small cell lung cancer, poorly differentiated squamous cell carcinoma diagnosed in February 2017 with PDL 1 expression of 35%, presented with large left lower lobe lung mass in addition to left upper lobe lung nodule and mediastinal lymphadenopathy. PRIOR THERAPY: Course of concurrent chemoradiation with weekly carboplatin for AUC of 2 and paclitaxel 45 MG/M2. Status post 7 cycles, last dose was given 11/19/2015 with partial response.  CURRENT THERAPY: Observation.       OV 03/03/2018  Subjective:  Patient ID: Jason Summers, male , DOB: 1932-02-22 , age 72 y.o. , MRN: [id number] , ADDRESS: [place]  [place] [id number]   03/03/2018 -   Chief Complaint  Patient presents with  . Follow-up    SOB with exertion, still on Spiriva and feels it is working      HPI [employer] 84 y.o. -follow-up for shortness of breath related to radiation fibrosis of the lung, mild emphysema and physical deconditioning.  After last visit in May 2019 we started him on Spiriva.  He says this helped only a little bit.  He still has fixed dyspnea on exertion relieved by rest.  He was in [place] and this limited his exertion at various places.  However he says overall he is stable since last visit.  He is definitely not worse.  No associated chest pain or wheezing or cough.  He had a CT scan of the chest September 2019 that I personally visualized and agree with the report that this shows radiation related changes but no active cancer.  Also for the last few months he is on and off hoarseness of voice with variable tone in his voice.  Wife thinks is because of allergies.  Wife feels that patient has fixed issues and needs to accept his quality of life and status and health but patient feels that he constantly wants to get better.  No fever     Simple office walk 185 feet x  3 laps goal with forehead probe 10/28/2017   O2 used Room   Number laps completed 3  Comments about pace Good pace  Resting Pulse  Ox/HR 100% and 83/min  Final Pulse Ox/HR 97% and 96/min  Desaturated </= 88% no  Desaturated <= 3% points yds  Got Tachycardic >/= 90/min yes  Symptoms at end of test x  Miscellaneous comments x    ROS - per HPI     has a past medical history of Arthritis, Cancer ([place]), Hemorrhoids, Lateral epicondylitis (tennis elbow), Non-small cell lung cancer (NSCLC) ([place]) (dx'f 07/30/15), Odynophagia (11/06/2015), and Spondylosis of cervical joint.   reports that he quit smoking about 6 years ago. His smoking use included cigarettes. He has a 12.40 pack-year smoking history. He has never used smokeless tobacco.  Past Surgical History:  Procedure Laterality Date  . COLONOSCOPY  2003  . KNEE ARTHROSCOPY  2006   right    Allergies  Allergen Reactions  . Gentamicin Other (See Comments)    Made eyes red, swollen, hot feeling  . Loteprednol-Tobramycin     Other reaction(s): Other (See Comments) Made eyes Red, Swollen, Red, Warm feeling     Immunization History  Administered Date(s) Administered  . Influenza Split 03/19/2011, 03/12/2012  . Influenza Whole 06/09/1997, 04/22/2007, 03/14/2008, 03/28/2009, 05/09/2010  . Influenza, High Dose Seasonal PF 04/14/2013, 04/11/2015, 03/02/2017  . Influenza,inj,Quad PF,6+ Mos 05/07/2016  . Influenza-Unspecified 04/10/2014  . Pneumococcal Conjugate-13 10/14/2013  . Pneumococcal Polysaccharide-23 06/09/2004, 10/04/2012  . Td 06/09/2005  . Zoster 02/04/2015    Family History  Problem Relation Age of Onset  . Hypertension Mother   . Heart disease Mother   . Coronary artery disease Unknown      Current Outpatient Medications:  .  esomeprazole (NEXIUM) 40 MG capsule, Take 1 capsule (40 mg total) by mouth daily at 12 noon., Disp: 90 capsule, Rfl: 3 .  fish oil-omega-3 fatty acids 1000 MG capsule, Take 2 g by mouth daily. , Disp: , Rfl:  .  glucosamine-chondroitin 500-400 MG tablet, Take 1 tablet by mouth daily. Reported on 09/11/2015, Disp: , Rfl:    .  Tiotropium Bromide Monohydrate (SPIRIVA RESPIMAT) 2.5 MCG/ACT AERS, Inhale 2 puffs into the lungs daily., Disp: 2 Inhaler, Rfl: 2 .  Vitamin D, Ergocalciferol, (DRISDOL) 50000 units CAPS capsule, TK ONE C PO  WEEKLY THEN TK 1 C MONTHLY AFTER 12 WEEKS, Disp: , Rfl: 0      Objective:   Vitals:   03/03/18 1023  BP: 122/72  Pulse: 82  SpO2: 96%  Weight: 169 lb (76.7 kg)  Height: 5' 7" (1.702 m)    Estimated body mass index is 26.47 kg/m as calculated from the following:   Height as of this encounter: 5' 7" (1.702 m).   Weight as of this encounter: 169 lb (76.7 kg).  _0 @  [employer]   03/03/18 1023  Weight: 169 lb (76.7 kg)     Physical Exam  General Appearance:    Alert, cooperative, no distress, appears stated age - younger, sitting on -  chiar , Deconditioned looking - no  Head:    Normocephalic, without obvious abnormality, atraumatic  Eyes:    PERRL, conjunctiva/corneas clear,  Ears:    Normal TM's and external ear canals, both ears  Nose:   Nares normal, septum midline, mucosa normal, no drainage    or sinus tenderness. OXYGEN ON  - no . Patient is @ ra   Throat:   Lips, mucosa, and tongue normal; teeth and gums normal. Cyanosis on lips - no  Neck:   Supple, symmetrical, trachea midline, no adenopathy;    thyroid:  no enlargement/tenderness/nodules; no carotid   bruit or JVD  Back:     Symmetric, no curvature, ROM normal, no CVA tenderness  Lungs:     Distress - no , Wheeze no, Barrell Chest - no, Purse lip breathing - no, Crackles - no   Chest Wall:    No tenderness or deformity. Scars in chest no   Heart:    Regular rate and rhythm, S1 and S2 normal, no rub   or gallop, Murmur - no  Breast Exam:    NOT DONE  Abdomen:     Soft, non-tender, bowel sounds active all four quadrants,    no masses, no organomegaly. Visceral obesity - no  Genitalia:   NOT DONE  Rectal:   NOT DONE  Extremities:   Extremities normal, atraumatic, Clubbing - no, Edema - no   Pulses:   2+ and symmetric all extremities  Skin:   Stigmata of Connective Tissue Disease - no  Lymph nodes:   Cervical, supraclavicular, and axillary nodes normal  Psychiatric:  Neurologic:   flat affect  CAm-ICU - neg, Alert and Oriented x 3 - yes, Moves all 4s - yes, Speech - normal, Cognition - intact           Assessment:       ICD-10-CM   1. Shortness of breath R06.02   2. Pulmonary emphysema, unspecified emphysema type ([place]) J43.9   3. 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 Physical deconditioning R53.81        Plan:     Patient Instructions     ICD-10-CM   1. Shortness of breath R06.02   2. Pulmonary emphysema, unspecified emphysema type ([place]) J43.9   3. 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  Most of this fixed shortness of breath is because of scar tissue in the lung from radiation.  Some of  the shortness of breath is from lack of physical fitness.  Only some of shortness of breath is from emphysema and this is why you have some improvement only with Spiriva  Plan -We discussed many options and we finally decided  -He will continue Spiriva daily  -Try inspiratory  muscle trainer [take picture from my CMA] -10 times each time x 2 times a day  -Exercise with daily walks as much as possible  Followup  -Return in 3-5 months for follow-up; if symptoms are not improved then we will consider pulmonary stress test  PS: Hoarseness of voice -he will continue to monitor this but if it is a problem please let me know and we will refer you to Dr. Laurann [place]    Dr. Brand Males, M.D., F.C.C.P,  Pulmonary and Critical Care Medicine Staff Physician, [facility] Director - Interstitial Lung Disease  Program  Pulmonary [facility] [place], [place], [address]  Pager: [phone],  If no answer or between  15:00h - 7:00h: call [phone] Telephone: [phone]  11:30 AM 03/03/2018

## 2018-03-23 ENCOUNTER — Ambulatory Visit (INDEPENDENT_AMBULATORY_CARE_PROVIDER_SITE_OTHER): Payer: Medicare Other | Admitting: Adult Health

## 2018-03-23 ENCOUNTER — Encounter: Payer: Self-pay | Admitting: Adult Health

## 2018-03-23 VITALS — BP 120/70 | HR 75 | Temp 97.7°F | Ht 67.5 in | Wt 170.9 lb

## 2018-03-23 DIAGNOSIS — Z23 Encounter for immunization: Secondary | ICD-10-CM | POA: Diagnosis not present

## 2018-03-23 DIAGNOSIS — K219 Gastro-esophageal reflux disease without esophagitis: Secondary | ICD-10-CM | POA: Diagnosis not present

## 2018-03-23 DIAGNOSIS — Z0000 Encounter for general adult medical examination without abnormal findings: Secondary | ICD-10-CM | POA: Diagnosis not present

## 2018-03-23 DIAGNOSIS — R351 Nocturia: Secondary | ICD-10-CM

## 2018-03-23 DIAGNOSIS — R5383 Other fatigue: Secondary | ICD-10-CM | POA: Diagnosis not present

## 2018-03-23 LAB — CBC WITH DIFFERENTIAL/PLATELET
BASOS PCT: 0.6 % (ref 0.0–3.0)
Basophils Absolute: 0 10*3/uL (ref 0.0–0.1)
EOS PCT: 2.5 % (ref 0.0–5.0)
Eosinophils Absolute: 0.2 10*3/uL (ref 0.0–0.7)
HCT: 39.8 % (ref 39.0–52.0)
HEMOGLOBIN: 13.3 g/dL (ref 13.0–17.0)
LYMPHS ABS: 1 10*3/uL (ref 0.7–4.0)
Lymphocytes Relative: 15.6 % (ref 12.0–46.0)
MCHC: 33.5 g/dL (ref 30.0–36.0)
MCV: 85.2 fl (ref 78.0–100.0)
MONO ABS: 0.6 10*3/uL (ref 0.1–1.0)
Monocytes Relative: 9.1 % (ref 3.0–12.0)
NEUTROS PCT: 72.2 % (ref 43.0–77.0)
Neutro Abs: 4.5 10*3/uL (ref 1.4–7.7)
Platelets: 239 10*3/uL (ref 150.0–400.0)
RBC: 4.67 Mil/uL (ref 4.22–5.81)
RDW: 14 % (ref 11.5–15.5)
WBC: 6.3 10*3/uL (ref 4.0–10.5)

## 2018-03-23 LAB — BASIC METABOLIC PANEL
BUN: 17 mg/dL (ref 6–23)
CHLORIDE: 102 meq/L (ref 96–112)
CO2: 31 meq/L (ref 19–32)
Calcium: 9.1 mg/dL (ref 8.4–10.5)
Creatinine, Ser: 0.78 mg/dL (ref 0.40–1.50)
GFR: 100.72 mL/min (ref >60.00)
GLUCOSE: 98 mg/dL (ref 70–99)
POTASSIUM: 4.6 meq/L (ref 3.5–5.1)
Sodium: 138 mEq/L (ref 135–145)

## 2018-03-23 LAB — HEPATIC FUNCTION PANEL
ALT: 11 U/L (ref 0–53)
AST: 11 U/L (ref 0–37)
Albumin: 3.9 g/dL (ref 3.5–5.2)
Alkaline Phosphatase: 78 U/L (ref 39–117)
BILIRUBIN TOTAL: 0.4 mg/dL (ref 0.2–1.2)
Bilirubin, Direct: 0.1 mg/dL (ref 0.0–0.3)
Total Protein: 7.3 g/dL (ref 6.0–8.3)

## 2018-03-23 LAB — LIPID PANEL
Cholesterol: 161 mg/dL (ref 0–200)
HDL: 48.6 mg/dL (ref >39.00)
LDL Cholesterol: 100 mg/dL — ABNORMAL HIGH (ref 0–99)
NONHDL: 112.09
Total CHOL/HDL Ratio: 3
Triglycerides: 62 mg/dL (ref 0.0–149.0)
VLDL: 12.4 mg/dL (ref 0.0–40.0)

## 2018-03-23 LAB — TSH: TSH: 2.09 u[IU]/mL (ref 0.35–4.50)

## 2018-03-23 LAB — PSA: PSA: 2.41 ng/mL (ref 0.10–4.00)

## 2018-03-23 LAB — VITAMIN B12: VITAMIN B 12: 260 pg/mL (ref 211–911)

## 2018-03-23 LAB — VITAMIN D 25 HYDROXY (VIT D DEFICIENCY, FRACTURES): VITD: 29.2 ng/mL — AB (ref 30.00–100.00)

## 2018-03-23 MED ORDER — ESOMEPRAZOLE MAGNESIUM 40 MG PO CPDR: 40.0000 mg | DELAYED_RELEASE_CAPSULE | Freq: Every day | ORAL | 3 refills | Status: DC

## 2018-03-23 MED ORDER — DUTASTERIDE 0.5 MG PO CAPS: 0.5000 mg | ORAL_CAPSULE | Freq: Every day | ORAL | 3 refills | Status: DC

## 2018-03-23 NOTE — Progress Notes (Signed)
Subjective:    Patient ID: Jason Summers, male    DOB: 1931/10/04, 84 y.o.   MRN: [id number]  HPI  Patient presents for yearly preventative medicine examination. He is a pleasant 84 year old male who  has a past medical history of Arthritis, Cancer ([place]), Hemorrhoids, Lateral epicondylitis (tennis elbow), Non-small cell lung cancer (NSCLC) ([place]) (dx'f 07/30/15), Odynophagia (11/06/2015), and Spondylosis of cervical joint.  GERD - Uses Nexium 40 mg every other day.   H/O Non small call cancer of left lung. - Is followed by Dr. Aundra Dubin on an observation basis every six months.   Non-Small cell cancer of left lung. - Has been through chemo and radiation. Is being followed by Dr. Julien Nordmann on an observational basis every 6 months.    Emphysema- is followed by Pulmonary. Was last seen in September 2019. Is using Spiriva   Vitamin D deficiency - was on 50,000 units but finished this about 2 months ago.   Fatigue - This is a chronic issue. Reports that he wakes up in the morning and after he showers he feels fatigued for the rest of the day. This is a daily occurrence.   Nocturia - reports getting up about 6 times throughout the night to urinate. Was prescribed Flomax at one point in time but this caused him to be dizzy. Associated symptoms include decreased stream, frequency, and incomplete bladder emptying.   All immunizations and health maintenance protocols were reviewed with the patient and needed orders were placed. Due for seasonal flu vaccination   Appropriate screening laboratory values were ordered for the patient including screening of hyperlipidemia, renal function and hepatic function.  Medication reconciliation,  past medical history, social history, problem list and allergies were reviewed in detail with the patient  Goals were established with regard to weight loss, exercise, and  diet in compliance with medications  Wt Readings from Last 3 Encounters:  03/23/18 170 lb 14.4 oz  (77.5 kg)  03/03/18 169 lb (76.7 kg)  02/17/18 169 lb 8 oz (76.9 kg)   End of life planning was discussed.  Review of Systems  Constitutional: Positive for fatigue.  HENT: Negative.   Eyes: Negative.   Respiratory: Positive for shortness of breath.   Cardiovascular: Negative.   Gastrointestinal: Negative.   Endocrine: Negative.   Genitourinary: Positive for difficulty urinating, frequency and urgency.  Musculoskeletal: Negative.   Skin: Negative.   Allergic/Immunologic: Negative.   Neurological: Negative.   Hematological: Negative.   Psychiatric/Behavioral: Negative.   All other systems reviewed and are negative.  Past Medical History:  Diagnosis Date  . Arthritis   . Cancer ([place])    basal cell on right temple  . Hemorrhoids   . Lateral epicondylitis (tennis elbow)   . Non-small cell lung cancer (NSCLC) ([place]) dx'f 07/30/15  . Odynophagia 11/06/2015  . Spondylosis of cervical joint     Social History   Socioeconomic History  . Marital status: Married    Spouse name: Not on file  . Number of children: Not on file  . Years of education: Not on file  . Highest education level: Not on file  Occupational History  . Occupation: Retired     Comment: Animal nutritionist  . Financial resource strain: Not on file  . Food insecurity:    Worry: Not on file    Inability: Not on file  . Transportation needs:    Medical: Not on file    Non-medical: Not on file  Tobacco Use  .  Smoking status: Former Smoker    Packs/day: 0.20    Years: 62.00    Pack years: 12.40    Types: Cigarettes    Last attempt to quit: 03/10/2011    Years since quitting: 7.0  . Smokeless tobacco: Never Used  . Tobacco comment: smokes 1 ciagettes occasionally  Substance and Sexual Activity  . Alcohol use: Yes    Alcohol/week: 7.0 standard drinks    Types: 7 Glasses of wine per week    Comment: 1-2 glasses of wine a day  . Drug use: No  . Sexual activity: Yes  Lifestyle  . Physical activity:      Days per week: Not on file    Minutes per session: Not on file  . Stress: Not on file  Relationships  . Social connections:    Talks on phone: Not on file    Gets together: Not on file    Attends religious service: Not on file    Active member of club or organization: Not on file    Attends meetings of clubs or organizations: Not on file    Relationship status: Not on file  . Intimate partner violence:    Fear of current or ex partner: Not on file    Emotionally abused: Not on file    Physically abused: Not on file    Forced sexual activity: Not on file  Other Topics Concern  . Not on file  Social History Narrative  . Not on file    Past Surgical History:  Procedure Laterality Date  . COLONOSCOPY  2003  . KNEE ARTHROSCOPY  2006   right    Family History  Problem Relation Age of Onset  . Hypertension Mother   . Heart disease Mother   . Coronary artery disease Unknown     Allergies  Allergen Reactions  . Gentamicin Other (See Comments)    Made eyes red, swollen, hot feeling  . Loteprednol-Tobramycin Other (See Comments)    Other reaction(s): Other (See Comments) Made eyes Red, Swollen, Red, Warm feeling  Other reaction(s): Other (See Comments) Made eyes Red, Swollen, Red, Warm feeling  Made eyes Red, Swollen, Red, Warm feeling     Current Outpatient Medications on File Prior to Visit  Medication Sig Dispense Refill  . fish oil-omega-3 fatty acids 1000 MG capsule Take 2 g by mouth daily.     Marland Kitchen glucosamine-chondroitin 500-400 MG tablet Take 1 tablet by mouth daily. Reported on 09/11/2015    . Tiotropium Bromide Monohydrate (SPIRIVA RESPIMAT) 2.5 MCG/ACT AERS Inhale 2 puffs into the lungs daily. 2 Inhaler 2  . Vitamin D, Ergocalciferol, (DRISDOL) 50000 units CAPS capsule TK ONE C PO  WEEKLY THEN TK 1 C MONTHLY AFTER 12 WEEKS  0   No current facility-administered medications on file prior to visit.     BP 120/70 (BP Location: Left Arm, Patient Position: Sitting,  Cuff Size: Normal)   Pulse 75   Temp 97.7 F (36.5 C) (Oral)   Ht 5' 7.5" (1.715 m)   Wt 170 lb 14.4 oz (77.5 kg)   SpO2 97%   BMI 26.37 kg/m       Objective:   Physical Exam  Constitutional: He is oriented to person, place, and time. He appears well-developed and well-nourished. No distress.  Appears tired    HENT:  Head: Normocephalic and atraumatic.  Right Ear: External ear normal.  Left Ear: External ear normal.  Nose: Nose normal.  Mouth/Throat: Oropharynx is clear and moist.  No oropharyngeal exudate.  Eyes: Pupils are equal, round, and reactive to light. Conjunctivae and EOM are normal. Right eye exhibits no discharge. Left eye exhibits no discharge. No scleral icterus.  Neck: Normal range of motion. Neck supple. No JVD present. No tracheal deviation present. No thyromegaly present.  Cardiovascular: Normal rate, regular rhythm, normal heart sounds and intact distal pulses. Exam reveals no gallop and no friction rub.  No murmur heard. Pulmonary/Chest: Effort normal and breath sounds normal. No stridor. No respiratory distress. He has no wheezes. He has no rales. He exhibits no tenderness.  Abdominal: Soft. Bowel sounds are normal. He exhibits no distension and no mass. There is no tenderness. There is no rebound and no guarding. No hernia.  Musculoskeletal: Normal range of motion. He exhibits no edema, tenderness or deformity.  Lymphadenopathy:    He has no cervical adenopathy.  Neurological: He is alert and oriented to person, place, and time. He displays normal reflexes. No cranial nerve deficit or sensory deficit. He exhibits normal muscle tone. Coordination normal.  Skin: Skin is warm and dry. Capillary refill takes less than 2 seconds. No rash noted. He is not diaphoretic. No erythema. No pallor.  Psychiatric: He has a normal mood and affect. His behavior is normal. Judgment and thought content normal.  Nursing note and vitals reviewed.     Assessment & Plan:  1.  Routine general medical examination at a health care facility - Six month follow up  - esomeprazole (NEXIUM) 40 MG capsule; Take 1 capsule (40 mg total) by mouth daily at 12 noon.  Dispense: 90 capsule; Refill: 3 - Basic metabolic panel - CBC with Differential/Platelet - Hepatic function panel - Lipid panel - TSH  2. Gastroesophageal reflux disease without esophagitis  - esomeprazole (NEXIUM) 40 MG capsule; Take 1 capsule (40 mg total) by mouth daily at 12 noon.  Dispense: 90 capsule; Refill: 3  3. Fatigue, unspecified type - Likely chronic in nature and will be his new normal.  - Encouraged heart healthy diet and exercise. Will check B12 and D levels - Vitamin J28 - Basic metabolic panel - CBC with Differential/Platelet - TSH - Vitamin D, 25-hydroxy  4. Nocturia  - PSA - dutasteride (AVODART) 0.5 MG capsule; Take 1 capsule (0.5 mg total) by mouth daily.  Dispense: 30 capsule; Refill: 3

## 2018-03-23 NOTE — Patient Instructions (Signed)
It was great seeing you today   I will follow up with you regarding your blood work   I have prescribed a medication call Avodart to help with your enlarged prostate   Please let me know if you need anything

## 2018-04-19 ENCOUNTER — Telehealth: Payer: Self-pay | Admitting: Internal Medicine

## 2018-04-19 MED ORDER — TIOTROPIUM BROMIDE MONOHYDRATE 2.5 MCG/ACT IN AERS: 2.0000 | INHALATION_SPRAY | Freq: Every day | RESPIRATORY_TRACT | 5 refills | Status: DC

## 2018-04-19 NOTE — Telephone Encounter (Signed)
Called and spoke to patient wife, she is requesting a refill of Spiriva be sent to [facility]. Pharmacy confirmed. Refill sent. Voiced understanding, and expresse thanks. Nothing further is needed at this time.

## 2018-04-20 ENCOUNTER — Telehealth: Payer: Self-pay

## 2018-04-20 MED ORDER — TIOTROPIUM BROMIDE MONOHYDRATE 2.5 MCG/ACT IN AERS: 2.0000 | INHALATION_SPRAY | Freq: Every day | RESPIRATORY_TRACT | 5 refills | Status: DC

## 2018-04-20 NOTE — Telephone Encounter (Signed)
Spoke with patient wife, patient wife states she needs a refill of spiriva sent to [facility], informed patient we would call pharmacy. Upon review [facility]. Called patient wife and explained that [facility] and this Probation officer apologized to patient as I was the one who made the mistake. Refill sent to [facility] and phoned pharmacy to confirm. Patient wife voiced understanding and expressed thanks. Nothing further is needed at this time.

## 2018-05-17 ENCOUNTER — Telehealth: Payer: Self-pay | Admitting: Internal Medicine

## 2018-05-17 MED ORDER — TIOTROPIUM BROMIDE MONOHYDRATE 2.5 MCG/ACT IN AERS: 2.0000 | INHALATION_SPRAY | Freq: Every day | RESPIRATORY_TRACT | 3 refills | Status: DC

## 2018-05-17 NOTE — Telephone Encounter (Signed)
Called and spoke to pt's spouse, Jason Summers [facility]). Jason Summers is requesting 3 month supply for Sp[facility] 2.05 [facility].  Rx for Spiriva 2.5 has been sent to preferred pharmacy. Nothing further is needed.

## 2018-06-01 ENCOUNTER — Telehealth: Payer: Self-pay | Admitting: Internal Medicine

## 2018-06-01 NOTE — Telephone Encounter (Signed)
Called and spoke with Patient's wife,Laura. She stated that she is worried that her husband is getting worse, and she was wanting him to see, Dr. Chase Caller.  Dr. Golden Pop first available appointment is 06/25/18, at [place].  Offered appointment with NP's.  Explained that they could be seen today, they declined.  Explained that  if he seemed more SHOB, discomfort, to please go to the ED, call 911.  Appointment is scheduled for 06/25/18 at 0945, with MR.   Understanding stated.  Nothing further.

## 2018-06-11 ENCOUNTER — Telehealth: Payer: Self-pay | Admitting: Internal Medicine

## 2018-06-11 NOTE — Telephone Encounter (Signed)
Attempted to call pt's wife Mickel Baas back to let them know of pt's appt again was unable to reach her. Left message for Mickel Baas to return call.  When she returns call, please make her aware of pt's appt that has been scheduled for Tues. 1/7 with MR at 11:45. Thanks!

## 2018-06-11 NOTE — Telephone Encounter (Signed)
Checked with MR to see if pt could be added in one of the held research slots 06/15/18 and MR said to check with Wickett Bing to see if a pt was needing to be added on that day before I scheduled pt.  Checked with Anderson Malta and no research pt was needed 06/15/18. Looking at MR's schedule and saw that MR had a cancellation 06/15/18 at 11:45. Attempted to call pt's wife Mickel Baas to let them know of the appt time that is now avail but unable to reach her and unable to leave a VM.  I have gone ahead and scheduled pt in the 11:45 slot on 06/15/18. Will try to call laura and pt back later but if she does call back, please let her know of the appt that has been scheduled for pt. Thanks!

## 2018-06-11 NOTE — Telephone Encounter (Signed)
MR, please advise if we can use one of the held research slots on 06/15/18 to schedule pt an OV with you? There are three held times for research from 10:15-10:45.

## 2018-06-11 NOTE — Telephone Encounter (Signed)
She is asking that I put back a message for MR to see when he returns advising how patient is doing and to see if he would work him in possibly on the 06/15/2018.

## 2018-06-14 NOTE — Telephone Encounter (Signed)
Ended up calling pt's wife Jason Summers from my cell phone due to the calls not going through when using our office phone. Unable to reach her when I called her.  I left a detailed message for Jason Summers stating the appt info for pt's appt tomorrow, 06/15/2018 with MR and also stated in the message for her to call our office at the provided office phone number I left in the message that way we can have her confirm pt's appt info has been received by her and also so we can see if she has our new office address or if not, provide it for her.

## 2018-06-14 NOTE — Telephone Encounter (Signed)
Attempted to contact pt's wife, Mickel Baas. I tried several times but the call would not go through. Will try back.

## 2018-06-15 ENCOUNTER — Telehealth: Payer: Self-pay | Admitting: Internal Medicine

## 2018-06-15 ENCOUNTER — Ambulatory Visit (INDEPENDENT_AMBULATORY_CARE_PROVIDER_SITE_OTHER): Payer: Medicare Other | Admitting: Internal Medicine

## 2018-06-15 ENCOUNTER — Encounter: Payer: Self-pay | Admitting: Internal Medicine

## 2018-06-15 VITALS — BP 122/70 | HR 89 | Ht 67.5 in | Wt 170.0 lb

## 2018-06-15 DIAGNOSIS — R0602 Shortness of breath: Secondary | ICD-10-CM | POA: Diagnosis not present

## 2018-06-15 DIAGNOSIS — C3492 Malignant neoplasm of unspecified part of left bronchus or lung: Secondary | ICD-10-CM

## 2018-06-15 DIAGNOSIS — J439 Emphysema, unspecified: Secondary | ICD-10-CM

## 2018-06-15 LAB — CBC WITH DIFFERENTIAL/PLATELET
Basophils Absolute: 0.1 10*3/uL (ref 0.0–0.1)
Basophils Relative: 1 % (ref 0.0–3.0)
Eosinophils Absolute: 0.2 10*3/uL (ref 0.0–0.7)
Eosinophils Relative: 2.7 % (ref 0.0–5.0)
HCT: 39.1 % (ref 39.0–52.0)
Hemoglobin: 13.1 g/dL (ref 13.0–17.0)
Lymphocytes Relative: 13.9 % (ref 12.0–46.0)
Lymphs Abs: 1.1 10*3/uL (ref 0.7–4.0)
MCHC: 33.4 g/dL (ref 30.0–36.0)
MCV: 81.5 fl (ref 78.0–100.0)
Monocytes Absolute: 0.9 10*3/uL (ref 0.1–1.0)
Monocytes Relative: 11.2 % (ref 3.0–12.0)
Neutro Abs: 5.7 10*3/uL (ref 1.4–7.7)
Neutrophils Relative %: 71.2 % (ref 43.0–77.0)
Platelets: 245 10*3/uL (ref 150.0–400.0)
RBC: 4.79 Mil/uL (ref 4.22–5.81)
RDW: 15.3 % (ref 11.5–15.5)
WBC: 8 10*3/uL (ref 4.0–10.5)

## 2018-06-15 LAB — HEPATIC FUNCTION PANEL
ALBUMIN: 3.9 g/dL (ref 3.5–5.2)
ALT: 12 U/L (ref 0–53)
AST: 14 U/L (ref 0–37)
Alkaline Phosphatase: 83 U/L (ref 39–117)
Bilirubin, Direct: 0.1 mg/dL (ref 0.0–0.3)
Total Bilirubin: 0.4 mg/dL (ref 0.2–1.2)
Total Protein: 7.3 g/dL (ref 6.0–8.3)

## 2018-06-15 LAB — BASIC METABOLIC PANEL
BUN: 20 mg/dL (ref 6–23)
CO2: 26 mEq/L (ref 19–32)
Calcium: 8.9 mg/dL (ref 8.4–10.5)
Chloride: 100 mEq/L (ref 96–112)
Creatinine, Ser: 0.89 mg/dL (ref 0.40–1.50)
GFR: 86.44 mL/min (ref >60.00)
Glucose, Bld: 102 mg/dL — ABNORMAL HIGH (ref 70–99)
POTASSIUM: 4.3 meq/L (ref 3.5–5.1)
Sodium: 133 mEq/L — ABNORMAL LOW (ref 135–145)

## 2018-06-15 LAB — MAGNESIUM: Magnesium: 2 mg/dL (ref 1.5–2.5)

## 2018-06-15 NOTE — Telephone Encounter (Signed)
Patient's wife returned call, she thinks calls may not be going through because phone may be recognizing our number as a robo call.  She is aware of appointment and very appreciative MR could see him today.  She also is aware of new address.  No call back is required.

## 2018-06-15 NOTE — Progress Notes (Signed)
IOV 12/16/2016  Chief Complaint  Patient presents with  . Acute Visit    Pt c/o chest tightness, mid back pain, and right lateral rib pain when pt takes a deep breath in. Pt also c/o mild dry cough with some chest congestion. Pt denies f/c/s.     85 year old male with stage III non-small cell lung cancer and resultant radiation related pleural effusion. This supposed to go to [place] native country in June 2018 but he canceled because of his declining health. He tells me that his functional status is pretty good. His appetite is pretty good. However he is having worsening lower thoracic back pain. He saw my colleague approximately one month ago. Chest x-ray was unchanged. He was advised nonsteroidal anti-inflammatory drugs and Tylenol for pain relief. But now the pain has broken through. The pain is worse. It is present in the midthoracic right infra-axillary and lower anterior chest area. It gets worse with inspiration. It is severe. He feels he might need something stronger for pain relief. There is no neurologic deficit. There is no constipation. His effort tolerance is okay with there is slight increased fatigue.   OV 03/27/2017  Chief Complaint  Patient presents with  . Follow-up    Pt still has some pain in his back but it is better than it ws at last visit. Pt has noticed that he will occ. lose his voice and he has become weak, especially in the mornings. [name] states that pt has been taking some OTC for a cough and states that occ. it will be hard for him to breathe and does have occ. CP.    85 year old male with non-small cell lung cancer on observation therapy. I last saw him in July 2018. At that time he was bothered by left-sided chest pain. There was concern about cancer recurrence but a PET scan at that time showed improvement. Since then he's been on observation therapy.He now presents with his wife. He tells me that the pain is not much of an issue. He only has very  mild shortness of breath. But they both tell me that he is extremely fatigued. Particularly early in the morning. He has insomnia. Wife does think that he is deconditioned. Last lab work in August 2018 was fine. Has no symptoms of COPD exacerbation. He is up-to-date with his flu shot.he has had some headaches and neurology for that He might be depressed Walking desaturation test 185 feet 3 laps on room air: He finished it without any problems other than mild shortness of breath. Resting heart rate was 87/m. Final heart rate was 98/m. Resting pulse ox 100% final pulse ox 98%.   OV 06/29/2017  Chief Complaint  Patient presents with  . Follow-up    Pt and his wife states he is doing much better since last visit. Pt has been going to PT which has helped him gain strengh back, walking better, and brething better.   He has mild emphysema and this is follow-up for that.  He is here with his wife.  They tell me that he is not taking his inhalers.  Because it really does not benefit him.  The bigger issues that have been recently dealing with other issues of fatigue and low motivation and physical deconditioning.  Therefore at last visit we put him into pulmonary rehabilitation.  We checked a lot of his blood work it ended up that his vitamin D was very low.  Put him on vitamin  D replacement.  Now he reports that with both vitamin D replacement and with pulmonary rehabilitation he has significant improvement in his fatigue and energy levels.  He feels back to baseline.  In particular tl pulmonary rehabilitation has helped him.  They both are worried about the extent of lung cancer and the odds of recurrence.  The having follow-up CT scan with Dr. Julien Summers in a month or 2.  They feel that if his physical condition will allow and the CT scan does not show any recurrence he would want to visit [place] in the summer 2019.  Otherwise feeling good.   OV 10/28/2017  Chief Complaint  Patient presents with  . Follow-up     Pt states in the mornings it is hard to breathe. States he believes the fatigue has become better.   Jason Summers presents for follow-up with his wife.  The issues to be discussed are emphysema, shortness of breath and fatigue with vitamin D deficiency in the setting of lung cancer on observation therapy.  In addition there is travel advice coming up in 2 weeks he is going to [place] for a few months.  Overall stable.  He is no longer doing pulmonary rehab.  He uses only albuterol as needed because he does not want to do maintenance inhalers.  We told him to do his vitamin D once a month but I am not so sure as to what regimen he is following right now.  In the interim it appears that exertional dyspnea slightly worse although the fatigue component might be the same.  Wife feels is because he is anxious and because he wants to do more than his body is allowing him to do.  He does not follow the pulmonary rehabilitation home prescription accurately.  I reviewed the old chart found that Dr. Julien Summers has placed him on observation therapy after seeing him in March 2019.  Apparently there is a scan coming up in September 2019.  He clearly wants to be able to do more.  Walking desaturation test he got tachycardic and did not desaturate below 97%.  He is going to [place] early part of June 2019.  Is a long flight to [place].  Given the persistence of his dyspnea we did a deep dive with an objective dyspnea questionnaire.  It appears that the worst his dyspnea is level 3 out of 5.  He is level 3 for walking on a level with others of his age and walking uphill, picking up and straightening.  He is a level 2 dyspnea walking upstairs and washing the car.  He is a level 1 dyspnea for dressing watering the lawn sexual activities.  But is a level 0 dyspnea while eating or standing up from a chair of brushing teeth or showering or shaving.  It is associated with fatigue and arthralgia     DIAGNOSIS: Stage IIIA (T3, N2,  M0) non-small cell lung cancer, poorly differentiated squamous cell carcinoma diagnosed in February 2017 with PDL 1 expression of 35%, presented with large left lower lobe lung mass in addition to left upper lobe lung nodule and mediastinal lymphadenopathy. PRIOR THERAPY: Course of concurrent chemoradiation with weekly carboplatin for AUC of 2 and paclitaxel 45 MG/M2. Status post 7 cycles, last dose was given 11/19/2015 with partial response.  CURRENT THERAPY: Observation.       OV 03/03/2018  Subjective:  Patient ID: Jason Summers, male , DOB: 1932-02-22 , age 72 y.o. , MRN: [id number] , ADDRESS: [place]  [address] [place] [id number]   03/03/2018 -   Chief Complaint  Patient presents with  . Follow-up    SOB with exertion, still on Spiriva and feels it is working      HPI Jason Summers 85 y.o. -follow-up for shortness of breath related to radiation fibrosis of the lung, mild emphysema and physical deconditioning.  After last visit in May 2019 we started him on Spiriva.  He says this helped only a little bit.  He still has fixed dyspnea on exertion relieved by rest.  He was in [place] and this Jason his exertion at various places.  However he says overall he is stable since last visit.  He is definitely not worse.  No associated chest pain or wheezing or cough.  He had a CT scan of the chest September 2019 that I personally visualized and agree with the report that this shows radiation related changes but no active cancer.  Also for the last few months he is on and off hoarseness of voice with variable tone in his voice.  Wife thinks is because of allergies.  Wife feels that patient has fixed issues and needs to accept his quality of life and status and health but patient feels that he constantly wants to get better.  No fever      OV 06/15/2018  Subjective:  Patient ID: Jason Summers, male , DOB: 10-Sep-1931 , age 42 y.o. , MRN: [id number] , ADDRESS: [place]  [id number]   06/15/2018 -   Chief Complaint  Patient presents with  . Follow-up    Since the dx of lung cancer 3 years ago, pt has has had more problems with his breathing and has to stop multiple times while walking to catch his breath. Pt has an occ cough but states he has been having a lot of headaches and fatigue. Pt has also been having problems with shaking and also unable to talk to people due the SOB.     HPI Jason Summers 85 y.o. -presents acutely with his wife.  Since I last saw him in September 2019 he has had worsening shortness of breath.  This is to the point now that when he climbs a flight of stairs that he is panting quite a bit.  He also has to stop while he climbs.  Normal exertion is now becoming difficult.  It takes a long time for him to recover to the point that he is not able to talk immediately after exertion.  At rest he feels fine.  The wife also states is significantly depressed.  He is asking for himself to be checked into a nursing home which the wife is reluctant to do.  He is having nonspecific headaches that are chronic in nature.  But when he bends his head down it gets worse.  They believe in the last few months this is also worse.  There is no chest pain or wheezing or pedal edema or hemoptysis or weight loss.     Simple office walk 185 feet x  3 laps goal with forehead probe 10/28/2017  06/15/2018 [place] - 250 feet x 3 laps  O2 used Room  Room air  Number laps completed 3 3  Comments about pace Good pace normal  Resting Pulse Ox/HR 100% and 83/min 97% and 87/min  Final Pulse Ox/HR 97% and 96/min 91% and 101/mikn  Desaturated </= 88% no no  Desaturated <= 3% points yds Yes, 6 points  Got Tachycardic >/=  90/min yes yes  Symptoms at end of test x Moderate dyspnea  Miscellaneous comments x ? worse   ROS - per HPI     has a past medical history of Arthritis, Cancer ([facility]), Hemorrhoids, Lateral epicondylitis (tennis elbow), Non-small cell lung cancer (NSCLC)  ([facility]) (dx'f 07/30/15), Odynophagia (11/06/2015), and Spondylosis of cervical joint.   reports that he quit smoking about 7 years ago. His smoking use included cigarettes. He has a 12.40 pack-year smoking history. He has never used smokeless tobacco.  Past Surgical History:  Procedure Laterality Date  . COLONOSCOPY  2003  . KNEE ARTHROSCOPY  2006   right    Allergies  Allergen Reactions  . Gentamicin Other (See Comments)    Made eyes red, swollen, hot feeling  . Loteprednol-Tobramycin Other (See Comments)    Other reaction(s): Other (See Comments) Made eyes Red, Swollen, Red, Warm feeling  Other reaction(s): Other (See Comments) Made eyes Red, Swollen, Red, Warm feeling  Made eyes Red, Swollen, Red, Warm feeling     Immunization History  Administered Date(s) Administered  . Influenza Split 03/19/2011, 03/12/2012  . Influenza Whole 06/09/1997, 04/22/2007, 03/14/2008, 03/28/2009, 05/09/2010  . Influenza, High Dose Seasonal PF 04/14/2013, 04/11/2015, 03/02/2017, 03/23/2018  . Influenza,inj,Quad PF,6+ Mos 05/07/2016  . Influenza-Unspecified 04/10/2014  . Pneumococcal Conjugate-13 10/14/2013  . Pneumococcal Polysaccharide-23 06/09/2004, 10/04/2012  . Td 06/09/2005  . Zoster 02/04/2015    Family History  Problem Relation Age of Onset  . Hypertension Mother   . Heart disease Mother   . Coronary artery disease Unknown      Current Outpatient Medications:  .  esomeprazole (NEXIUM) 40 MG capsule, Take 1 capsule (40 mg total) by mouth daily at 12 noon., Disp: 90 capsule, Rfl: 3 .  fish oil-omega-3 fatty acids 1000 MG capsule, Take 2 g by mouth daily. , Disp: , Rfl:  .  glucosamine-chondroitin 500-400 MG tablet, Take 1 tablet by mouth daily. Reported on 09/11/2015, Disp: , Rfl:  .  Tiotropium Bromide Monohydrate (SPIRIVA RESPIMAT) 2.5 MCG/ACT AERS, Inhale 2 puffs into the lungs daily., Disp: 3 Inhaler, Rfl: 3 .  vitamin B-12 (CYANOCOBALAMIN) 1000 MCG tablet, Take 1,500 mcg by mouth  daily., Disp: , Rfl:       Objective:   Vitals:   06/15/18 1133  BP: 122/70  Pulse: 89  SpO2: 96%  Weight: 170 lb (77.1 kg)  Height: 5' 7.5" (1.715 m)    Estimated body mass index is 26.23 kg/m as calculated from the following:   Height as of this encounter: 5' 7.5" (1.715 m).   Weight as of this encounter: 170 lb (77.1 kg).  _0 @  Filed Weights   06/15/18 1133  Weight: 170 lb (77.1 kg)     Physical Exam  General Appearance:    Alert, cooperative, no distress, appears stated age - no , Deconditioned looking - yes , OBESE  - no, Sitting on Wheelchair -  no  Head:    Normocephalic, without obvious abnormality, atraumatic  Eyes:    PERRL, conjunctiva/corneas clear,  Ears:    Normal TM's and external ear canals, both ears  Nose:   Nares normal, septum midline, mucosa normal, no drainage    or sinus tenderness. OXYGEN ON  - no . Patient is @ ra   Throat:   Lips, mucosa, and tongue normal; teeth and gums normal. Cyanosis on lips - no  Neck:   Supple, symmetrical, trachea midline, no adenopathy;    thyroid:  no enlargement/tenderness/nodules; no carotid  bruit or JVD  Back:     Symmetric, no curvature, ROM normal, no CVA tenderness  Lungs:     Distress - no , Wheeze no, Barrell Chest - no, Purse lip breathing - no, Crackles - no. DIMINISHED LEFT BASEno   Chest Wall:    No tenderness or deformity.    Heart:    Regular rate and rhythm, S1 and S2 normal, no rub   or gallop, Murmur - no  Breast Exam:    NOT DONE  Abdomen:     Soft, non-tender, bowel sounds active all four quadrants,    no masses, no organomegaly. Visceral obesity - no  Genitalia:   NOT DONE  Rectal:   NOT DONE  Extremities:   Extremities - normal, Has Cane - no, Clubbing - no, Edema - no  Pulses:   2+ and symmetric all extremities  Skin:   Stigmata of Connective Tissue Disease - no  Lymph nodes:   Cervical, supraclavicular, and axillary nodes normal  Psychiatric:  Neurologic:   Pleasant - yes,  Anxious - yes, Flat affect - yes (LOOKS MORE DEPRESSED THAN BEFORE)  CAm-ICU - neg, Alert and Oriented x 3 - yes, Moves all 4s - yes, Speech - normal, Cognition - intact           Assessment:       ICD-10-CM   1. Shortness of breath R06.02   2. Non-small cell cancer of left lung (HCC) C34.92   3. Pulmonary emphysema, unspecified emphysema type ([facility]) J43.9        Plan:     Patient Instructions     ICD-10-CM   1. Shortness of breath R06.02   2. Non-small cell cancer of left lung (HCC) C34.92   3. Pulmonary emphysema, unspecified emphysema type ([place]) J43.9     Worse symptoms associated with worsening mood and possibly worsening headache  Plan CBc, bmet, mag, phos, lft 06/15/2018  CT chest with contrast Overnight oxygen study at home 2D echo We will check with Dr. Julien Summers about getting CT scan of the head or MRI  Followup  do all of above next few to several days and return to followup < 7-10 days   > 50% of this > 25 min visit spent in face to face counseling or coordination of care - by this undersigned MD - Dr Brand Males. This includes one or more of the following documented above: discussion of test results, diagnostic or treatment recommendations, prognosis, risks and benefits of management options, instructions, education, compliance or risk-factor reduction   SIGNATURE    Dr. Brand Males, M.D., F.C.C.P,  Pulmonary and Critical Care Medicine Staff Physician, [place] Director - Interstitial Lung Disease  Program  [facility] [facility] [place], [place], [id number]  Pager: [phone], If no answer or between  15:00h - 7:00h: call [phone] Telephone: [phone]  12:10 PM 06/15/2018

## 2018-06-15 NOTE — Telephone Encounter (Signed)
Hi Mohamed,  As you know Jason Summers has lung cancer.  He is complaining of worsening headaches.  Also having worsening shortness of breath.  I am doing a dyspnea work-up.  Is anything that you want me to order for his headache.  Alternatively, you can have your office order any imaging modality for his headache and address  Thank you  M

## 2018-06-15 NOTE — Patient Instructions (Addendum)
ICD-10-CM   1. Shortness of breath R06.02   2. Non-small cell cancer of left lung (HCC) C34.92   3. Pulmonary emphysema, unspecified emphysema type ([place]) J43.9     Worse symptoms associated with worsening mood and possibly worsening headache  Plan CBc, bmet, mag, phos, lft 06/15/2018  CT chest with contrast Overnight oxygen study at home 2D echo We will check with Dr. Julien Nordmann about getting CT scan of the head or MRI  Followup  do all of above next few to several days and return to followup < 7-10 days

## 2018-06-17 ENCOUNTER — Encounter: Payer: Self-pay | Admitting: Internal Medicine

## 2018-06-22 ENCOUNTER — Ambulatory Visit (INDEPENDENT_AMBULATORY_CARE_PROVIDER_SITE_OTHER)
Admission: RE | Admit: 2018-06-22 | Discharge: 2018-06-22 | Disposition: A | Discharge status: Home / Self Care | Payer: Medicare Other | Source: Ambulatory Visit | Attending: Internal Medicine | Admitting: Internal Medicine

## 2018-06-22 ENCOUNTER — Ambulatory Visit (HOSPITAL_COMMUNITY): Payer: Medicare Other | Attending: Cardiology

## 2018-06-22 ENCOUNTER — Other Ambulatory Visit: Payer: Self-pay

## 2018-06-22 DIAGNOSIS — R0602 Shortness of breath: Secondary | ICD-10-CM

## 2018-06-22 MED ORDER — IOPAMIDOL (ISOVUE-300) INJECTION 61%
80.0000 mL | Freq: Once | INTRAVENOUS | Status: AC | PRN
  Administered 2018-06-22: 80 mL via INTRAVENOUS

## 2018-06-25 ENCOUNTER — Encounter: Payer: Self-pay | Admitting: Internal Medicine

## 2018-06-25 ENCOUNTER — Ambulatory Visit (INDEPENDENT_AMBULATORY_CARE_PROVIDER_SITE_OTHER): Payer: Medicare Other | Admitting: Internal Medicine

## 2018-06-25 VITALS — BP 122/70 | HR 96 | Ht 67.5 in | Wt 171.2 lb

## 2018-06-25 DIAGNOSIS — J84112 Idiopathic pulmonary fibrosis: Secondary | ICD-10-CM | POA: Diagnosis not present

## 2018-06-25 DIAGNOSIS — R0602 Shortness of breath: Secondary | ICD-10-CM

## 2018-06-25 NOTE — Patient Instructions (Addendum)
ICD-10-CM   1. IPF (idiopathic pulmonary fibrosis) ([facility]) J84.112   2. 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 and later possible that somehow that chemo/radiation made it easier to come one  This disease has potential to get worse with time  Plan - stop fish oil - continue other meds - nexium, spiriva  - in an effort to prevent it from getting worse - start Ofev 160m twice daily  - we discussed this medicine in detail   - take with food  - apply to charity for copay  - monitor for diarrhea  - good to take in support of the pharmaceutical company to help manage the drug  - recommend pulmonary rehabilitation but will hold off for now based on our discussion  Followup   - 6 weeks do spirometry/dlco  - return to ILD clinic in 6 weeks - call or come sooner if any change in breathing status

## 2018-06-25 NOTE — Progress Notes (Signed)
IOV 12/16/2016  Chief Complaint  Patient presents with  . Acute Visit    Pt c/o chest tightness, mid back pain, and right lateral rib pain when pt takes a deep breath in. Pt also c/o mild dry cough with some chest congestion. Pt denies f/c/s.     85 year old male with stage III non-small cell lung cancer and resultant radiation related pleural effusion. This supposed to go to [place] his native country in June 2018 but he canceled because of his declining health. He tells me that his functional status is pretty good. His appetite is pretty good. However he is having worsening lower thoracic back pain. He saw my colleague approximately one month ago. Chest x-ray was unchanged. He was advised nonsteroidal anti-inflammatory drugs and Tylenol for pain relief. But now the pain has broken through. The pain is worse. It is present in the midthoracic right infra-axillary and lower anterior chest area. It gets worse with inspiration. It is severe. He feels he might need something stronger for pain relief. There is no neurologic deficit. There is no constipation. His effort tolerance is okay with there is slight increased fatigue.   OV 03/27/2017  Chief Complaint  Patient presents with  . Follow-up    Pt still has some pain in his back but it is better than it ws at last visit. Pt has noticed that he will occ. lose his voice and he has become weak, especially in the mornings. [name] states that pt has been taking some OTC for a cough and states that occ. it will be hard for him to breathe and does have occ. CP.    85 year old male with non-small cell lung cancer on observation therapy. I last saw him in July 2018. At that time he was bothered by left-sided chest pain. There was concern about cancer recurrence but a PET scan at that time showed improvement. Since then he's been on observation therapy.He now presents with his wife. He tells me that the pain is not much of an issue. He only has very  mild shortness of breath. But they both tell me that he is extremely fatigued. Particularly early in the morning. He has insomnia. Wife does think that he is deconditioned. Last lab work in August 2018 was fine. Has no symptoms of COPD exacerbation. He is up-to-date with his flu shot.he has had some headaches and neurology for that He might be depressed Walking desaturation test 185 feet 3 laps on room air: He finished it without any problems other than mild shortness of breath. Resting heart rate was 87/m. Final heart rate was 98/m. Resting pulse ox 100% final pulse ox 98%.   OV 06/29/2017  Chief Complaint  Patient presents with  . Follow-up    Pt and his wife states he is doing much better since last visit. Pt has been going to PT which has helped him gain strengh back, walking better, and brething better.   He has mild emphysema and this is follow-up for that.  He is here with his wife.  They tell me that he is not taking his inhalers.  Because it really does not benefit him.  The bigger issues that have been recently dealing with other issues of fatigue and low motivation and physical deconditioning.  Therefore at last visit we put him into pulmonary rehabilitation.  We checked a lot of his blood work it ended up that his vitamin D was very low.  Put him on vitamin  D replacement.  Now he reports that with both vitamin D replacement and with pulmonary rehabilitation he has significant improvement in his fatigue and energy levels.  He feels back to baseline.  In particular tl pulmonary rehabilitation has helped him.  They both are worried about the extent of lung cancer and the odds of recurrence.  The having follow-up CT scan with Dr. Julien Nordmann in a month or 2.  They feel that if his physical condition will allow and the CT scan does not show any recurrence he would want to visit [place] in the summer 2019.  Otherwise feeling good.   OV 10/28/2017  Chief Complaint  Patient presents with  . Follow-up     Pt states in the mornings it is hard to breathe. States he believes the fatigue has become better.   Meghan Waguespack presents for follow-up with his wife.  The issues to be discussed are emphysema, shortness of breath and fatigue with vitamin D deficiency in the setting of lung cancer on observation therapy.  In addition there is travel advice coming up in 2 weeks he is going to [place] for a few months.  Overall stable.  He is no longer doing pulmonary rehab.  He uses only albuterol as needed because he does not want to do maintenance inhalers.  We told him to do his vitamin D once a month but I am not so sure as to what regimen he is following right now.  In the interim it appears that exertional dyspnea slightly worse although the fatigue component might be the same.  Wife feels is because he is anxious and because he wants to do more than his body is allowing him to do.  He does not follow the pulmonary rehabilitation home prescription accurately.  I reviewed the old chart found that Dr. Julien Nordmann has placed him on observation therapy after seeing him in March 2019.  Apparently there is a scan coming up in September 2019.  He clearly wants to be able to do more.  Walking desaturation test he got tachycardic and did not desaturate below 97%.  He is going to [place] early part of June 2019.  Is a long flight to [place].  Given the persistence of his dyspnea we did a deep dive with an objective dyspnea questionnaire.  It appears that the worst his dyspnea is level 3 out of 5.  He is level 3 for walking on a level with others of his age and walking uphill, picking up and straightening.  He is a level 2 dyspnea walking upstairs and washing the car.  He is a level 1 dyspnea for dressing watering the lawn sexual activities.  But is a level 0 dyspnea while eating or standing up from a chair of brushing teeth or showering or shaving.  It is associated with fatigue and arthralgia     DIAGNOSIS: Stage IIIA (T3, N2,  M0) non-small cell lung cancer, poorly differentiated squamous cell carcinoma diagnosed in February 2017 with PDL 1 expression of 35%, presented with large left lower lobe lung mass in addition to left upper lobe lung nodule and mediastinal lymphadenopathy. PRIOR THERAPY: Course of concurrent chemoradiation with weekly carboplatin for AUC of 2 and paclitaxel 45 MG/M2. Status post 7 cycles, last dose was given 11/19/2015 with partial response.  CURRENT THERAPY: Observation.       OV 03/03/2018  Subjective:  Patient ID: Jason Summers, male , DOB: 1932-02-22 , age 72 y.o. , MRN: [id number] , ADDRESS: [place]  [address] [place] [address]   03/03/2018 -   Chief Complaint  Patient presents with  . Follow-up    SOB with exertion, still on Spiriva and feels it is working      HPI [employer] 85 y.o. -follow-up for shortness of breath related to radiation fibrosis of the lung, mild emphysema and physical deconditioning.  After last visit in May 2019 we started him on Spiriva.  He says this helped only a little bit.  He still has fixed dyspnea on exertion relieved by rest.  He was in [place] and this limited his exertion at various places.  However he says overall he is stable since last visit.  He is definitely not worse.  No associated chest pain or wheezing or cough.  He had a CT scan of the chest September 2019 that I personally visualized and agree with the report that this shows radiation related changes but no active cancer.  Also for the last few months he is on and off hoarseness of voice with variable tone in his voice.  Wife thinks is because of allergies.  Wife feels that patient has fixed issues and needs to accept his quality of life and status and health but patient feels that he constantly wants to get better.  No fever      OV 06/15/2018  Subjective:  Patient ID: Leven Hoel, male , DOB: 24-Apr-1932 , age 76 y.o. , MRN: [id number] , ADDRESS: [place]  [id number]   06/15/2018 -   Chief Complaint  Patient presents with  . Follow-up    Since the dx of lung cancer 3 years ago, pt has has had more problems with his breathing and has to stop multiple times while walking to catch his breath. Pt has an occ cough but states he has been having a lot of headaches and fatigue. Pt has also been having problems with shaking and also unable to talk to people due the SOB.     HPI Domanique Chait 85 y.o. -presents acutely with his wife.  Since I last saw him in September 2019 he has had worsening shortness of breath.  This is to the point now that when he climbs a flight of stairs that he is panting quite a bit.  He also has to stop while he climbs.  Normal exertion is now becoming difficult.  It takes a long time for him to recover to the point that he is not able to talk immediately after exertion.  At rest he feels fine.  The wife also states is significantly depressed.  He is asking for himself to be checked into a nursing home which the wife is reluctant to do.  He is having nonspecific headaches that are chronic in nature.  But when he bends his head down it gets worse.  They believe in the last few months this is also worse.  There is no chest pain or wheezing or pedal edema or hemoptysis or weight loss.  OV 06/25/2018  Subjective:  Patient ID: Jazion Atteberry, male , DOB: 03-15-1932 , age 33 y.o. , MRN: [id number] , ADDRESS: [place] [facility] [address]   06/25/2018 -   Chief Complaint  Patient presents with  . Follow-up    Pt states he is about the same as last visit. SOB is about the same and pt is still fatigued.     HPI Ranon Crouse 85 y.o. -presents for follow-up after work-up.  He had CT chest  June 22, 2018 that I personally visualized.  The radiologist reporting presence of UIP with honeycombing.  I visualized this and agree with the findings.  He had echocardiogram June 22, 2018 that shows grade 1 diastolic dysfunction and slightly  elevated pulmonary artery systolic pressure with ejection fraction 55-60%.  In retrospect his CT chest from early 2019 shows ILD findings.  This corresponds with the time that he has been getting worse.  Walking desaturation test still shows adequate oxygenation but tendency to drop.  Compared to his last visit there are no new interim problems.  He is not interested in doing pulmonary rehabilitation.  At this point in time discussion focused on IPF diagnosis, prognosis and treatment.  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  He smoked in the remote past.  He did get chemo and radiation for his lung few to several years ago       Simple office walk 185 feet x  3 laps goal with forehead probe 10/28/2017  06/15/2018 [place] - 250 feet x 3 laps 06/25/2018   O2 used Room  Room air Room air  Number laps completed _0 las x 25o feet  Comments about pace Good pace normal normal  Resting Pulse Ox/HR 100% and 83/min 97% and 87/min 98% and 96/min  Final Pulse Ox/HR 97% and 96/min 91% and 101/mikn 90% and 110/min  Desaturated </= 88% no no no  Desaturated <= 3% points yds Yes, 6 points Yes, 8 points  Got Tachycardic >/= 90/min yes yes yes  Symptoms at end of test x Moderate dyspnea Mild dysnea  Miscellaneous comments x ? worse      ROS - per HPI     has a past medical history of Arthritis, Cancer ([place]), Hemorrhoids, Lateral epicondylitis (tennis elbow), Non-small cell lung cancer (NSCLC) ([place]) (dx'f 07/30/15), Odynophagia (11/06/2015), and Spondylosis of cervical joint.   reports that he quit smoking about 7 years ago. His smoking use included cigarettes. He has a 12.40 pack-year smoking history. He has never used smokeless tobacco.  Past Surgical History:  Procedure Laterality Date  . COLONOSCOPY  2003  . KNEE ARTHROSCOPY  2006   right    Allergies  Allergen Reactions  . Gentamicin Other (See Comments)    Made eyes red, swollen, hot feeling  . Loteprednol-Tobramycin Other (See  Comments)    Other reaction(s): Other (See Comments) Made eyes Red, Swollen, Red, Warm feeling  Other reaction(s): Other (See Comments) Made eyes Red, Swollen, Red, Warm feeling  Made eyes Red, Swollen, Red, Warm feeling     Immunization History  Administered Date(s) Administered  . Influenza Split 03/19/2011, 03/12/2012  . Influenza Whole 06/09/1997, 04/22/2007, 03/14/2008, 03/28/2009, 05/09/2010  . Influenza, High Dose Seasonal PF 04/14/2013, 04/11/2015, 03/02/2017, 03/23/2018  . Influenza,inj,Quad PF,6+ Mos 05/07/2016  . Influenza-Unspecified 04/10/2014  . Pneumococcal Conjugate-13 10/14/2013  . Pneumococcal Polysaccharide-23 06/09/2004, 10/04/2012  . Td 06/09/2005  . Zoster 02/04/2015    Family History  Problem Relation Age of Onset  . Hypertension Mother   . Heart disease Mother   . Coronary artery disease Unknown      Current Outpatient Medications:  .  esomeprazole (NEXIUM) 40 MG capsule, Take 1 capsule (40 mg total) by mouth daily at 12 noon., Disp: 90 capsule, Rfl: 3 .  fish oil-omega-3 fatty acids 1000 MG capsule, Take 2 g by mouth daily. , Disp: , Rfl:  .  glucosamine-chondroitin 500-400 MG tablet, Take 1 tablet by mouth daily. Reported on  09/11/2015, Disp: , Rfl:  .  Tiotropium Bromide Monohydrate (SPIRIVA RESPIMAT) 2.5 MCG/ACT AERS, Inhale 2 puffs into the lungs daily., Disp: 3 Inhaler, Rfl: 3 .  vitamin B-12 (CYANOCOBALAMIN) 1000 MCG tablet, Take 1,500 mcg by mouth daily., Disp: , Rfl:       Objective:   Vitals:   06/25/18 0945  BP: 122/70  Pulse: 96  SpO2: 98%  Weight: 171 lb 3.2 oz (77.7 kg)  Height: 5' 7.5" (1.715 m)    Estimated body mass index is 26.42 kg/m as calculated from the following:   Height as of this encounter: 5' 7.5" (1.715 m).   Weight as of this encounter: 171 lb 3.2 oz (77.7 kg).  _0 @  Filed Weights   06/25/18 0945  Weight: 171 lb 3.2 oz (77.7 kg)     Physical Exam Face to face discussion           Assessment:       ICD-10-CM   1. IPF (idiopathic pulmonary fibrosis) (HCC) L50.757 Pulmonary function test  2. Shortness of breath R06.02        Plan:     Patient Instructions     ICD-10-CM   1. IPF (idiopathic pulmonary fibrosis) ([facility]) J84.112   2. 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 and later possible that somehow that chemo/radiation made it easier to come one  This disease has potential to get worse with time  Plan - stop fish oil - continue other meds - nexium, spiriva  - in an effort to prevent it from getting worse - start Ofev 168m twice daily  - we discussed this medicine in detail   - take with food  - apply to charity for copay  - monitor for diarrhea  - good to take in support of the pharmaceutical company to help manage the drug  - recommend pulmonary rehabilitation but will hold off for now based on our discussion  Followup   - 6 weeks do spirometry/dlco  - return to ILD clinic in 6 weeks - call or come sooner if any change in breathing status    > 50% of this > 25 min visit spent in face to face counseling or coordination of care - by this undersigned MD - Dr MBrand Males This includes one or more of the following documented above: discussion of test results, diagnostic or treatment recommendations, prognosis, risks and benefits of management options, instructions, education, compliance or risk-factor reduction   SIGNATURE    Dr. MBrand Males M.D., F.C.C.P,  Pulmonary and Critical Care Medicine Staff Physician, CGamewellDirector - Interstitial Lung Disease  Program  [place]ulmonary [place] [address] Pager: [phone] If no answer or between  15:00h - 7:00h: call [phone] Telephone: [phone]  10:25 AM 06/25/2018

## 2018-07-01 ENCOUNTER — Telehealth: Payer: Self-pay | Admitting: Internal Medicine

## 2018-07-01 NOTE — Telephone Encounter (Signed)
Medication name and strength: Ofev 153m capsules Provider: MR Pharmacy: [facility] home delivery Patient insurance ID: [id number]: [phone]: [phone] Was the PA started on CMM?  Today 07/01/2018 If yes, please enter the Key: [id number] Timeframe for approval/denial: 72 hours   Routing to EPetroliato f/u

## 2018-07-02 ENCOUNTER — Ambulatory Visit: Payer: Medicare Other | Admitting: Internal Medicine

## 2018-07-02 ENCOUNTER — Telehealth: Payer: Self-pay | Admitting: Cardiovascular Disease

## 2018-07-02 NOTE — Telephone Encounter (Signed)
Call came to Cardiology office today. Pt has not been seen since 2017 with Dr. Johnsie Cancel. Pt had recent echo ordered by Dr. Chase Caller. I called and left message for the pt to please call Dr. Golden Pop office as well as he will need to contact [facility] and give them the correct provider's name, Dr. Chase Caller.

## 2018-07-02 NOTE — Telephone Encounter (Signed)
New Message    3D scan was denied and needs nurse to call in to [facility] to start the appeals process to have test done.

## 2018-07-05 NOTE — Telephone Encounter (Signed)
[name] Zacarias Pontes is calling back [phone]

## 2018-07-05 NOTE — Telephone Encounter (Signed)
[name] Zacarias Pontes is calling back [phone]

## 2018-07-05 NOTE — Telephone Encounter (Signed)
ATC pt's wife, Zacarias Pontes. There was no answer and I could not leave a message. Will try back.

## 2018-07-05 NOTE — Telephone Encounter (Signed)
[name] is calling back. She would like to speak to nurse about when the Ofev is to be delivered. Cb is [phone]

## 2018-07-05 NOTE — Telephone Encounter (Signed)
[name] Jason Summers is calling back [phone]

## 2018-07-05 NOTE — Telephone Encounter (Signed)
Checked status of PA via [email or web address]- Pt had not been sent to plan, as questions were not completely answered and submitted.  Completed questions, and PA has been approved through 07/05/2019.  Forwarding to Raquel Sarna to make aware.

## 2018-07-05 NOTE — Telephone Encounter (Signed)
Spoke with pt's wife, Mickel Baas. She is aware that OFEV has been approved.

## 2018-07-05 NOTE — Telephone Encounter (Signed)
ATC patient's spouse Zacarias Pontes - line made a noise like it was connecting to a fax machine then the line hung up.  WCB.

## 2018-07-06 ENCOUNTER — Other Ambulatory Visit: Payer: Self-pay | Admitting: *Deleted

## 2018-07-06 ENCOUNTER — Telehealth: Payer: Self-pay | Admitting: Internal Medicine

## 2018-07-06 ENCOUNTER — Encounter: Payer: Self-pay | Admitting: *Deleted

## 2018-07-06 NOTE — Telephone Encounter (Signed)
[name] wants to speak to [place].

## 2018-07-06 NOTE — Telephone Encounter (Signed)
Jason Summers  Let Dean Tillery know that very mild desats at night on on ONO 06/17/2018 - < 88% for 37 min only. He can try 1L o2 at night and see if it helps his dyspnea/energy in day time  Where are with his anti fibrotic approval?  Thanks  MR

## 2018-07-06 NOTE — Telephone Encounter (Signed)
Patients wife returning phone call.  Phone number is [phone].

## 2018-07-06 NOTE — Progress Notes (Signed)
Oncology Nurse Navigator Documentation  Oncology Nurse Navigator Flowsheets 07/06/2018  Navigator Location [facility]  Navigator Encounter Type Other/I received an email from Dr. Chase Caller to cancel Ct scan.  I updated Dr. Julien Nordmann and he was ok to cancel ct scan.  Scan cancelled.   Treatment Phase Follow-up  Barriers/Navigation Needs Coordination of Care  Interventions Coordination of Care  Coordination of Care Other  Acuity Level 2  Time Spent with Patient 30

## 2018-07-06 NOTE — Telephone Encounter (Signed)
Received call from Patients Wife, requesting a call from [place].  She is wanting Ofev updates.  Informed Dickey Gave is approved.  She ask that Raquel Sarna call today at [phone]   Message routed to [facility] to follow up

## 2018-07-06 NOTE — Telephone Encounter (Signed)
Patient's wife Mickel Baas is returning phone call.  Phone number is [phone] or [phone].

## 2018-07-06 NOTE — Telephone Encounter (Signed)
  [place] and spoke with Almyra Free to check on status of pt's OFEV. Per Almyra Free she was able to see the approval for the [facility] and everything should be set for them to call pt today to schedule delivery of the medication.  Attempted to call pt's wife Mickel Baas but when trying to dial her number, due to the suspected robocall, the call was immediately disconnected.  If pt's wife does call back, please let her know the above message. I am in clinic all day today so I might not be able to take her call if she calls back.

## 2018-07-06 NOTE — Telephone Encounter (Signed)
[facility] and spoke with Almyra Free to check on status of pt's OFEV. Per Almyra Free she was able to see the approval for the [facility] and everything should be set for them to call pt today to schedule delivery of the medication.  Attempted to call pt's wife Mickel Baas but when trying to dial her number, due to the suspected robocall, the call was immediately disconnected.  If pt's wife does call back, please let her know the above message. I am in clinic all day today so I might not be able to take her call if she calls back.

## 2018-07-06 NOTE — Telephone Encounter (Signed)
Spoke with pt's wife, Mickel Baas. She is aware of Emily's message. Nothing further was needed at this time.

## 2018-07-06 NOTE — Telephone Encounter (Signed)
ATC pt, no answer. Left message for pt to call back.

## 2018-07-09 MED ORDER — ALBUTEROL SULFATE HFA 108 (90 BASE) MCG/ACT IN AERS: 2.0000 | INHALATION_SPRAY | Freq: Four times a day (QID) | RESPIRATORY_TRACT | 6 refills | Status: DC | PRN

## 2018-07-09 NOTE — Telephone Encounter (Signed)
Thanks - Ok with plan on ofev  2. Recommend stopping fish oil   3. Ok to send albuterol as needed

## 2018-07-09 NOTE — Telephone Encounter (Signed)
Called and spoke with pt's wife Jason Summers stating to her the results of the ONO. Jason Summers stated she would talk with pt over weekend to see if he wanted to try O2 to see if it would help and then would call us back Monday to let us know response. Jason Summers also stated to me that they are scheduled to meet with OFEV RN Monday at our office to discuss med and other information with them. Pt has not started on med yet and that is why they are coming in for discussion to see if pt is ready to begin med. Med has been approved for pt.  MR, Jason Summers wanted to know if a rescue inhaler might be able to be prescribed for pt to see if this could also help with dyspnea. Please advise. Thanks!

## 2018-07-09 NOTE — Telephone Encounter (Signed)
Albuterol HFA inhaler has been sent to pt's pharmacy. Attempted to call pt's wife Jason Summers to let her know I had sent this in but the call was disconnected. Will await a return call from Jason Summers Monday, 07/12/2018 and we can let her know this and discuss other info with her per MR.

## 2018-07-13 ENCOUNTER — Telehealth: Payer: Self-pay | Admitting: *Deleted

## 2018-07-13 ENCOUNTER — Telehealth: Payer: Self-pay | Admitting: Internal Medicine

## 2018-07-13 NOTE — Telephone Encounter (Signed)
Oncology Nurse Navigator Documentation  Oncology Nurse Navigator Flowsheets 07/13/2018  Navigator Location [facility]  Navigator Encounter Type Telephone/I received a vm message from patient's wife.  She wanted to cancel all appts with Dr. Julien Nordmann.  I updated Dr. Julien Nordmann and he would like to continue to follow due to HX lung cancer. I called and spoke with Ms. Bradby.  I explained the need for follow up with Dr. Julien Nordmann.  She verbalized understanding and will keep his appt on 08/18/2018.   Telephone Incoming Call;Outgoing Call  Treatment Phase Follow-up  Barriers/Navigation Needs Education;Coordination of Care  Education Other  Interventions Coordination of Care;Education  Coordination of Care Other  Education Method Verbal  Acuity Level 2  Time Spent with Patient 30

## 2018-07-13 NOTE — Telephone Encounter (Signed)
Called and spoke with patients wife, she stated that the inhalers are not working for the patient and he feels like his SOB is increasing. Patients wife would like for the patient to be seen. Scheduled patient with Aaron Edelman for 07/14/2018 at 9:30. Patients wife aware of location time and physician. Also aware if symptoms get worse to seek emergency care.

## 2018-07-13 NOTE — Progress Notes (Signed)
_0  ID: Jason Summers, male    DOB: 01/28/1932, 85 y.o.   MRN: [id number]  Chief Complaint  Patient presents with  . Acute Visit    IPF, increased shortness of breath    Referring provider: [name], NP  HPI:  85 year old male former smoker followed in our office for idiopathic pulmonary fibrosis  PMH: GERD, lung cancer, radiation fibrosis of the lung, esophageal strictures, chest pain, headaches Smoker/ Smoking History: Former smoker Maintenance: Spiriva 2.5 , Ofev Pt of: Dr. Chase Caller  07/14/2018  - Visit   85 year old male former smoker with recent diagnosis of idiopathic pulmonary fibrosis.  Patient was recently started on 0FEV on 07/07/2018.  Patient reports that completed [place] visit on 07/12/2018.  Patient continues to have ongoing dyspnea and shortness of breath.  Patient has a recent overnight oximetry test that does reveal that he has nocturnal hypoxemia.  Unfortunately patient has not started oxygen at night.  Patient was hoping that his rescue inhaler would be able to compensate for that.  Patient has been using his rescue inhaler every 6 hours scheduled since it was prescribed.  Patient did not know that this was missed to be used as needed.  Patient also stopped taking his Spiriva Respimat 2.5 because he felt that it was not working with the shortness of breath.  Patient also continues to struggle with his recent diagnosis as well as managing the multiple new medical interventions at office visits required with this diagnosis.  Patient's wife is with patient today and is suggesting the patient may need help with management of anxiety and potentially depression.  They have not followed up with primary care regarding the symptoms.   Tests:   06/22/2018-CT chest with contrast- spectrum of findings suggestive of basilar predominant fibrotic interstitial lung disease with mild honeycombing asymmetrically involving the right lung with significant progression since  February/2019 in September/2019 chest CT.  Findings consistent with UIP.  06/22/2018-echocardiogram- LV ejection fraction 55 to 23%, grade 1 diastolic dysfunction, PA P pressure 45  ONO 06/17/2018 - < 88% for 37 min only.  SIX MIN WALK 07/14/2018 06/25/2018 06/15/2018 10/28/2017 03/27/2017 08/14/2016  Supplimental Oxygen during Test? (L/min) _1  No  Tech Comments: patient walked at average pace, was a little short of breath walked all 3 laps, tolerated well. no desat Pt walked at a normal pace completing all required laps. Pt had mild SOB. Pt walked at a normal pace completing all required laps. Pt had moderate SOB and was "huffing and puffing" during the walk. steady walk, no complications TA/CMA Pt walked at a normal pace successfully completing all required laps. Pt's only complaint was mild SOB. Pt walked at moderate pace with steady gait.      FENO:  No results found for: NITRICOXIDE  PFT: No flowsheet data found.  Imaging: Ct Chest W Contrast  Result Date: 06/22/2018 CLINICAL DATA:  History of stage IIIA left lower lobe squamous cell lung cancer diagnosed February 2017 status post chemo radiation therapy. Patient presents with dyspnea. EXAM: CT CHEST WITH CONTRAST TECHNIQUE: Multidetector CT imaging of the chest was performed during intravenous contrast administration. CONTRAST:  42m ISOVUE-300 IOPAMIDOL (ISOVUE-300) INJECTION 61% COMPARISON:  02/12/2018 chest CT. FINDINGS: Cardiovascular: Normal heart size. No significant pericardial effusion/thickening. Left anterior descending coronary atherosclerosis. Atherosclerotic nonaneurysmal thoracic aorta. Normal caliber pulmonary arteries. No central pulmonary emboli. Mediastinum/Nodes: No discrete thyroid nodules. Mildly patulous thoracic esophagus. No axillary adenopathy. Stable mildly enlarged right paratracheal 1.3 cm node (series 2/image  57). No new pathologically enlarged mediastinal nodes. No hilar adenopathy. Lungs/Pleura: No  pneumothorax. No right pleural effusion. Stable loculated small basilar left pleural effusion with smooth left pleural thickening and scattered left pleural calcification. Sharply marginated consolidation in left lower lobe and paramediastinal left upper lobe with associated volume loss, bronchiectasis and distortion, unchanged, compatible with radiation fibrosis. Extensive patchy confluent subpleural reticulation and ground-glass attenuation throughout both lungs with associated moderate traction bronchiectasis and architectural distortion with, with a basilar predominance and asymmetric involvement of the right lung. Mild honeycombing at the right lung base. These findings have progressed in the interval since 02/12/2018 chest CT and significantly progressed since 08/06/2017 chest CT. No acute consolidative airspace disease status or significant pulmonary nodules. Upper abdomen: Small hiatal hernia. Simple liver cysts, largest 3.1 cm in the lateral segment left liver lobe. Granulomatous liver dome calcification. Musculoskeletal: No aggressive appearing focal osseous lesions. Stable chronic moderate T6 vertebral compression fracture with associated mixed lytic and sclerotic changes. Mild thoracic spondylosis. IMPRESSION: 1. Spectrum of findings suggestive of basilar predominant fibrotic interstitial lung disease with mild honeycombing, asymmetrically involving the right lung with, with significant progression since 02/12/2018 and 08/06/2017 chest CT studies. Findings are consistent with UIP per consensus guidelines: Diagnosis of Idiopathic Pulmonary Fibrosis: An Official ATS/ERS/JRS/ALAT Clinical Practice Guideline. [place], Iss 5, [phone], Feb 07 2017. 2. Stable post treatment changes in the left hemithorax with no evidence of local tumor recurrence in the left lung. 3. No new or progressive findings of metastatic disease in the chest. Stable mild right paratracheal adenopathy. Stable  chronic T6 vertebral compression fracture with associated mixed lytic and sclerotic change, potentially pathologic. Aortic Atherosclerosis (ICD10-I70.0). Electronically Signed   By: Ilona Sorrel M.D.   On: 06/22/2018 19:15      Specialty Problems      Pulmonary Problems   Pulmonary nodules   Mass of lingula of lung   Non-small cell cancer of left lung ([place])    09/13/2015: Left lower lobe lung biopsy: Overall, the histologic features, in conjunction with the positive staining for cytokeratin 903, cytokeratin 5/6, and p63 support a diagnosis of poorly differentiated squamous cell carcinoma. There is likely sufficient tumor remaining for additional studies, if requested. (JBK:ds 09/17/15)        Pleural effusion on left   Pulmonary emphysema (HCC)   Radiation fibrosis of lung (HCC)   Shortness of breath   IPF (idiopathic pulmonary fibrosis) ([place])    06/22/2018-CT chest with contrast- spectrum of findings suggestive of basilar predominant fibrotic interstitial lung disease with mild honeycombing asymmetrically involving the right lung with significant progression since February/2019 in September/2019 chest CT.  Findings consistent with UIP.  06/22/2018-echocardiogram- LV ejection fraction 55 to 09%, grade 1 diastolic dysfunction, PA P pressure 45  ONO 06/17/2018 - < 88% for 37 min only.  07/07/2018 - started OFEV          Allergies  Allergen Reactions  . Gentamicin Other (See Comments)    Made eyes red, swollen, hot feeling  . Loteprednol-Tobramycin Other (See Comments)    Other reaction(s): Other (See Comments) Made eyes Red, Swollen, Red, Warm feeling  Other reaction(s): Other (See Comments) Made eyes Red, Swollen, Red, Warm feeling  Made eyes Red, Swollen, Red, Warm feeling     Immunization History  Administered Date(s) Administered  . Influenza Split 03/19/2011, 03/12/2012  . Influenza Whole 06/09/1997, 04/22/2007, 03/14/2008, 03/28/2009, 05/09/2010  . Influenza, High  Dose Seasonal PF 04/14/2013, 04/11/2015, 03/02/2017,  03/23/2018  . Influenza,inj,Quad PF,6+ Mos 05/07/2016  . Influenza-Unspecified 04/10/2014  . Pneumococcal Conjugate-13 10/14/2013  . Pneumococcal Polysaccharide-23 06/09/2004, 10/04/2012  . Td 06/09/2005  . Zoster 02/04/2015    Past Medical History:  Diagnosis Date  . Arthritis   . Cancer ([place])    basal cell on right temple  . Hemorrhoids   . Lateral epicondylitis (tennis elbow)   . Non-small cell lung cancer (NSCLC) ([place]) dx'f 07/30/15  . Odynophagia 11/06/2015  . Spondylosis of cervical joint     Tobacco History: Social History   Tobacco Use  Smoking Status Former Smoker  . Packs/day: 0.20  . Years: 62.00  . Pack years: 12.40  . Types: Cigarettes  . Last attempt to quit: 03/10/2011  . Years since quitting: 7.3  Smokeless Tobacco Never Used  Tobacco Comment   smokes 1 ciagettes occasionally   Counseling given: Yes Comment: smokes 1 ciagettes occasionally  Continue to not smoke  Outpatient Encounter Medications as of 07/14/2018  Medication Sig  . albuterol (PROVENTIL HFA;VENTOLIN HFA) 108 (90 Base) MCG/ACT inhaler Inhale 2 puffs into the lungs every 6 (six) hours as needed for wheezing or shortness of breath.  . esomeprazole (NEXIUM) 40 MG capsule Take 1 capsule (40 mg total) by mouth daily at 12 noon.  Marland Kitchen glucosamine-chondroitin 500-400 MG tablet Take 1 tablet by mouth daily. Reported on 09/11/2015  . OFEV 150 MG CAPS Take 1 tablet by mouth 2 (two) times daily.  . vitamin B-12 (CYANOCOBALAMIN) 1000 MCG tablet Take 1,500 mcg by mouth daily.  . budesonide-formoterol (SYMBICORT) 160-4.5 MCG/ACT inhaler Inhale 2 puffs into the lungs 2 (two) times daily.  . fish oil-omega-3 fatty acids 1000 MG capsule Take 2 g by mouth daily.   . hydrOXYzine (VISTARIL) 25 MG capsule Can take 1 tablet (25 mg) or 2 tablets every 6 hours as needed for anxiety.  This medication is sedating.  . Tiotropium Bromide Monohydrate (SPIRIVA RESPIMAT) 2.5  MCG/ACT AERS Inhale 2 puffs into the lungs daily. (Patient not taking: Reported on 07/14/2018)   No facility-administered encounter medications on file as of 07/14/2018.      Review of Systems  Review of Systems  Constitutional: Positive for fatigue. Negative for activity change, chills, fever and unexpected weight change.  HENT: Negative for sinus pressure, sneezing and sore throat.   Eyes: Negative.   Respiratory: Positive for shortness of breath. Negative for cough and wheezing.   Cardiovascular: Negative for chest pain, palpitations and leg swelling.  Gastrointestinal: Negative for diarrhea, nausea and vomiting.  Endocrine: Negative.   Musculoskeletal: Negative.   Skin: Negative.   Neurological: Negative for dizziness and headaches.  Psychiatric/Behavioral: Negative for dysphoric mood. The patient is nervous/anxious.   All other systems reviewed and are negative.    Physical Exam  BP 122/62 (BP Location: Left Arm, Cuff Size: Normal)   Pulse 83   Temp 98.3 F (36.8 C) (Oral)   Ht _0  (1.702 m)   Wt 170 lb 12.8 oz (77.5 kg)   SpO2 97%   BMI 26.75 kg/m   Wt Readings from Last 5 Encounters:  07/14/18 170 lb 12.8 oz (77.5 kg)  06/25/18 171 lb 3.2 oz (77.7 kg)  06/15/18 170 lb (77.1 kg)  03/23/18 170 lb 14.4 oz (77.5 kg)  03/03/18 169 lb (76.7 kg)     Physical Exam  Constitutional: He is oriented to person, place, and time and well-developed, well-nourished, and in no distress. No distress.  HENT:  Head: Normocephalic and atraumatic.  Right Ear: Hearing and external ear normal.  Left Ear: Hearing and external ear normal.  Nose: Nose normal. Right sinus exhibits no maxillary sinus tenderness and no frontal sinus tenderness. Left sinus exhibits no maxillary sinus tenderness and no frontal sinus tenderness.  Mouth/Throat: Uvula is midline and oropharynx is clear and moist. No oropharyngeal exudate.  + Cerumen bilaterally obstructing ear canals  Eyes: Pupils are equal,  round, and reactive to light.  Neck: Normal range of motion. Neck supple. No JVD present.  Cardiovascular: Normal rate, regular rhythm and normal heart sounds.  Pulmonary/Chest: Effort normal and breath sounds normal. No accessory muscle usage. No respiratory distress. He has no decreased breath sounds. He has no wheezes. He has no rhonchi.  Musculoskeletal: Normal range of motion.        General: No edema.  Lymphadenopathy:    He has no cervical adenopathy.  Neurological: He is alert and oriented to person, place, and time. Gait normal.  Skin: Skin is warm and dry. He is not diaphoretic. No erythema.  Psychiatric: Judgment normal. His mood appears anxious. He exhibits a depressed mood. He expresses no homicidal and no suicidal ideation. He expresses no suicidal plans and no homicidal plans. He has a flat affect.  + Confusion regarding follow-up visits as well as new medications +Wife helped significantly with this.  Nursing note and vitals reviewed.   SIX MIN WALK 07/14/2018 06/25/2018 06/15/2018 10/28/2017 03/27/2017 08/14/2016  Supplimental Oxygen during Test? (L/min) _0  No  Tech Comments: patient walked at average pace, was a little short of breath walked all 3 laps, tolerated well. no desat Pt walked at a normal pace completing all required laps. Pt had mild SOB. Pt walked at a normal pace completing all required laps. Pt had moderate SOB and was "huffing and puffing" during the walk. steady walk, no complications TA/CMA Pt walked at a normal pace successfully completing all required laps. Pt's only complaint was mild SOB. Pt walked at moderate pace with steady gait.      Lab Results:  CBC    Component Value Date/Time   WBC 8.0 06/15/2018 1224   RBC 4.79 06/15/2018 1224   HGB 13.1 06/15/2018 1224   HGB 13.1 02/12/2018 0841   HGB 14.3 01/27/2017 0826   HCT 39.1 06/15/2018 1224   HCT 43.0 01/27/2017 0826   PLT 245.0 06/15/2018 1224   PLT 214 02/12/2018 0841   PLT 183  01/27/2017 0826   MCV 81.5 06/15/2018 1224   MCV 86.4 01/27/2017 0826   MCH 28.7 02/12/2018 0841   MCHC 33.4 06/15/2018 1224   RDW 15.3 06/15/2018 1224   RDW 14.5 01/27/2017 0826   LYMPHSABS 1.1 06/15/2018 1224   LYMPHSABS 1.2 01/27/2017 0826   MONOABS 0.9 06/15/2018 1224   MONOABS 0.6 01/27/2017 0826   EOSABS 0.2 06/15/2018 1224   EOSABS 0.1 01/27/2017 0826   BASOSABS 0.1 06/15/2018 1224   BASOSABS 0.0 01/27/2017 0826    BMET    Component Value Date/Time   NA 133 (L) 06/15/2018 1224   NA 139 01/27/2017 0826   K 4.3 06/15/2018 1224   K 4.4 01/27/2017 0826   CL 100 06/15/2018 1224   CO2 26 06/15/2018 1224   CO2 25 01/27/2017 0826   GLUCOSE 102 (H) 06/15/2018 1224   GLUCOSE 102 01/27/2017 0826   BUN 20 06/15/2018 1224   BUN 15.5 01/27/2017 0826   CREATININE 0.89 06/15/2018 1224   CREATININE 0.90 02/12/2018 0841   CREATININE 0.9  01/27/2017 0826   CALCIUM 8.9 06/15/2018 1224   CALCIUM 9.1 01/27/2017 0826   GFRNONAA >60 02/12/2018 0841   GFRAA >60 02/12/2018 0841    BNP    Component Value Date/Time   BNP 52.0 08/21/2015 1206    ProBNP No results found for: PROBNP    Assessment & Plan:     IPF (idiopathic pulmonary fibrosis) (HCC) Assessment: UIP on January/2020 CT chest with contrast Started 0FEV on 07/07/2018 Recent walks in office confirm patient does not have oxygen desaturations on room air Recent overnight oximetry confirms patient needs 1 to 2 L of O2 at night Patient has not started overnight oxygen yet Walk in office today reveals no oxygen desaturations below 88%  Plan: Continue OFEV Trial of Symbicort 160 twice daily Can continue rescue inhaler Start 1 to 2 L of O2 at night Keep follow-up with Dr. Chase Caller Keep scheduled pulmonary function test   Anxiety Assessment: Probable depression and increased anxiety after recent IPF diagnosis Recent continuous rescue inhaler use has also exacerbated this  Plan: Hydroxyzine prescribed  today Patient to follow-up with primary care regarding management of chronic anxiety Patient may benefit from SSRI or even benzodiazepines     Lauraine Rinne, NP 07/14/2018   This appointment was 42 minutes long with over 50% of the time in direct face-to-face patient care, assessment, plan of care, and follow-up.

## 2018-07-14 ENCOUNTER — Telehealth: Payer: Self-pay | Admitting: Internal Medicine

## 2018-07-14 ENCOUNTER — Encounter: Payer: Self-pay | Admitting: Pulmonary Disease

## 2018-07-14 ENCOUNTER — Ambulatory Visit (INDEPENDENT_AMBULATORY_CARE_PROVIDER_SITE_OTHER): Payer: Medicare Other | Admitting: Pulmonary Disease

## 2018-07-14 VITALS — BP 122/62 | HR 83 | Temp 98.3°F | Ht 67.0 in | Wt 170.8 lb

## 2018-07-14 DIAGNOSIS — F419 Anxiety disorder, unspecified: Secondary | ICD-10-CM

## 2018-07-14 DIAGNOSIS — J84112 Idiopathic pulmonary fibrosis: Secondary | ICD-10-CM

## 2018-07-14 DIAGNOSIS — F411 Generalized anxiety disorder: Secondary | ICD-10-CM | POA: Insufficient documentation

## 2018-07-14 HISTORY — DX: Anxiety disorder, unspecified: F41.9

## 2018-07-14 HISTORY — DX: Idiopathic pulmonary fibrosis: J84.112

## 2018-07-14 MED ORDER — BUDESONIDE-FORMOTEROL FUMARATE 160-4.5 MCG/ACT IN AERO: 2.0000 | INHALATION_SPRAY | Freq: Two times a day (BID) | RESPIRATORY_TRACT | 0 refills | Status: DC

## 2018-07-14 MED ORDER — HYDROXYZINE PAMOATE 25 MG PO CAPS: ORAL_CAPSULE | ORAL | 3 refills | Status: DC

## 2018-07-14 NOTE — Patient Instructions (Addendum)
Walk in office today on Room Air  >>> Did not need oxygen on the walk  Trial Symbicort 160 >>> 2 puffs in the morning right when you wake up, rinse out your mouth after use, 12 hours later 2 puffs, rinse after use >>> Take this daily, no matter what >>> This is not a rescue inhaler  >>>STOP spiriva 2.5  Only use your albuterol as a rescue medication to be used if you can't catch your breath by resting or doing a relaxed purse lip breathing pattern.  - The less you use it, the better it will work when you need it. - Ok to use up to 2 puffs  every 4 hours if you must but call for immediate appointment if use goes up over your usual need - Don't leave home without it !!  (think of it like the spare tire for your car)   Continue OFEV as prescribed   Start overnight oxygen - 1-2L at night   Continue oxygen therapy as prescribed  >>>maintain oxygen saturations greater than 88 percent  >>>if unable to maintain oxygen saturations please contact the office  >>>do not smoke with oxygen  >>>can use nasal saline gel or nasal saline rinses to moisturize nose if oxygen causes dryness  Hydroxyzine 41m tablet  >>> Can take 1 or 2 tablets every 6 hours as needed for anxiety >>>This medication is sedating  Contact primary care and schedule an appointment within the next 2 weeks to discuss anxiety   Keep scheduled follow-up with Dr. RChase Caller  It is flu season:   >>>Remember to be washing your hands regularly, using hand sanitizer, be careful to use around herself with has contact with people who are sick will increase her chances of getting sick yourself. >>> Best ways to protect herself from the flu: Receive the yearly flu vaccine, practice good hand hygiene washing with soap and also using hand sanitizer when available, eat a nutritious meals, get adequate rest, hydrate appropriately   Please contact the office if your symptoms worsen or you have concerns that you are not improving.    Thank you for choosing [facility] for your healthcare, and for allowing [facility] partner with you on your healthcare journey. I am thankful to be able to provide care to you today.   BWyn QuakerFNP-C          Home Oxygen Use, Adult When a medical condition keeps you from getting enough oxygen, your health care provider may instruct you to take extra oxygen at home. Your health care provider will let you know:  When to take oxygen.  For how long to take oxygen.  How quickly oxygen should be delivered (flow rate), in liters per minute (LPM or L/M). Home oxygen can be given through:  A mask.  A nasal cannula. This is a device or tube that goes in the nostrils.  A transtracheal catheter. This is a small, flexible tube placed in the trachea.  A tracheostomy. This is a surgically made opening in the trachea. These devices are connected with tubing to an oxygen source, such as:  A tank. Tanks hold oxygen in gas form. They must be replaced when the oxygen is used up.  A liquid oxygen device. This holds oxygen in liquid form. It must be replaced when the oxygen is used up.  An oxygen concentrator machine. This filters oxygen in the room. It uses electricity, so you must have a backup cylinder of oxygen in  case the power goes out. Supplies needed: To use oxygen, you will need:  A mask, nasal cannula, transtracheal catheter, or tracheostomy.  An oxygen tank, a liquid oxygen device, or an oxygen concentrator.  The tape that your health care provider recommends (optional). If you use a transtracheal catheter and your prescribed flow rate is 1 LPM or greater, you will also need a humidifier. Risks and complications  Fire. This can happen if the oxygen is exposed to a heat source, flame, or spark.  Injury to skin. This can happen if liquid oxygen touches your skin.  Organ damage. This can happen if you get too little oxygen. How to use oxygen Your health care  provider or a representative from your [place] will show you how to use your oxygen device. Follow her or his instructions. The instructions may look something like this: 1. Wash your hands. 2. If you use an oxygen concentrator, make sure it is plugged in. 3. Place one end of the tube into the port on the tank, device, or machine. 4. Place the mask over your nose and mouth. Or, place the nasal cannula and secure it with tape if instructed. If you use a tracheostomy or transtracheal catheter, connect it to the oxygen source as directed. 5. Make sure the liter-flow setting on the machine is at the level prescribed by your health care provider. 6. Turn on the machine or adjust the knob on the tank or device to the correct liter-flow setting. 7. When you are done, turn off and unplug the machine, or turn the knob to OFF. How to clean and care for the oxygen supplies Nasal cannula  Clean it with a warm, wet cloth daily or as needed.  Wash it with a liquid soap once a week.  Rinse it thoroughly once or twice a week.  Replace it every 2-4 weeks.  If you have an infection, such as a cold or pneumonia, change the cannula when you get better. Mask  Replace it every 2-4 weeks.  If you have an infection, such as a cold or pneumonia, change the mask when you get better. Humidifier bottle  Wash the bottle between each refill: ? Wash it with soap and warm water. ? Rinse it thoroughly. ? Disinfect it and its top. ? Air-dry it.  Make sure it is dry before you refill it. Oxygen concentrator  Clean the air filter at least twice a week according to directions from your home medical equipment and service company.  Wipe down the cabinet every day. To do this: ? Unplug the unit. ? Wipe down the cabinet with a damp cloth. ? Dry the cabinet. Other equipment  Change any extra tubing every 1-3 months.  Follow instructions from your health care provider about taking care of any other  equipment. Safety tips Fire safety tips   Keep your oxygen and oxygen supplies at least 5 ft away from sources of heat, flames, and sparks at all times.  Do not allow smoking near your oxygen. Put up "no smoking" signs in your home. Avoid smoking areas when in public.  Do not use materials that can burn (are flammable) while you use oxygen.  When you go to a restaurant with portable oxygen, ask to be seated in the nonsmoking section.  Keep a Data processing manager close by. Let your fire department know that you have oxygen in your home.  Test your home smoke detectors regularly. Traveling  Secure your oxygen tank in the vehicle  so that it does not move around. Follow instructions from your medical device company about how to safely secure your tank.  Make sure you have enough oxygen for the amount of time you will be away from home.  If you are planning air travel, contact the airline to find out if they allow the use of an approved portable oxygen concentrator. You may also need documents from your health care provider and medical device company before you travel. General safety tips  If you use an oxygen cylinder, make sure it is in a stand or secured to an object that will not move (fixed object).  If you use liquid oxygen, make sure its container is kept upright.  If you use an oxygen concentrator: ? Dance movement psychotherapist company. Make sure you are given priority service in the event that your power goes out. ? Avoid using extension cords, if possible. Follow these instructions at home:  Use oxygen only as told by your health care provider.  Do not use alcohol or other drugs that make you relax (sedating drugs) unless instructed. They can slow down your breathing rate and make it hard to get in enough oxygen.  Know how and when to order a refill of oxygen.  Always keep a spare tank of oxygen. Plan ahead for holidays when you may not be able to get a prescription filled.  Use  water-based lubricants on your lips or nostrils. Do not use oil-based products like petroleum jelly.  To prevent skin irritation on your cheeks or behind your ears, tuck some gauze under the tubing. Contact a health care provider if:  You get headaches often.  You have shortness of breath.  You have a lasting cough.  You have anxiety.  You are sleepy all the time.  You develop an illness that affects your breathing.  You cannot exercise at your regular level.  You are restless.  You have difficult or irregular breathing, and it is getting worse.  You have a fever.  You have persistent redness under your nose. Get help right away if:  You are confused.  You have blue lips or fingernails.  You are struggling to breathe. Summary  Your health care provider or a representative from your [place] will show you how to use your oxygen device. Follow her or his instructions.  If you use an oxygen concentrator, make sure it is plugged in.  Make sure the liter-flow setting on the machine is at the level prescribed by your health care provider.  Keep your oxygen and oxygen supplies at least 5 ft away from sources of heat, flames, and sparks at all times. This information is not intended to replace advice given to you by your health care provider. Make sure you discuss any questions you have with your health care provider. Document Released: 08/16/2003 Document Revised: 11/12/2017 Document Reviewed: 12/18/2015 Elsevier Interactive Patient Education  2019 [employer].

## 2018-07-14 NOTE — Telephone Encounter (Signed)
ONO results are listed in MR telephone message and my note. MR may still have the results. We may need to contact [facility] to fax [place] the results again.   Wyn Quaker FNP

## 2018-07-14 NOTE — Assessment & Plan Note (Addendum)
Assessment: UIP on January/2020 CT chest with contrast Started 0FEV on 07/07/2018 Recent walks in office confirm patient does not have oxygen desaturations on room air Recent overnight oximetry confirms patient needs 1 to 2 L of O2 at night Patient has not started overnight oxygen yet Walk in office today reveals no oxygen desaturations below 88%  Plan: Continue OFEV Trial of Symbicort 160 twice daily Can continue rescue inhaler Start 1 to 2 L of O2 at night Keep follow-up with Dr. Chase Caller Keep scheduled pulmonary function test

## 2018-07-14 NOTE — Telephone Encounter (Signed)
Called and spoke with Jason Summers.  She stated that [facility] did not do ONO, so they do not have results from 06/17/18. ONO results need to be faxed to [facility], so the Patient can get his oxygen.  Wyn Quaker, NP, please advise if you have ONO results

## 2018-07-14 NOTE — Assessment & Plan Note (Signed)
Assessment: Probable depression and increased anxiety after recent IPF diagnosis Recent continuous rescue inhaler use has also exacerbated this  Plan: Hydroxyzine prescribed today Patient to follow-up with primary care regarding management of chronic anxiety Patient may benefit from SSRI or even benzodiazepines

## 2018-07-14 NOTE — Progress Notes (Signed)
Patient seen in the office today and instructed on use of Symbicort inhaler.  Patient expressed understanding and demonstrated technique.

## 2018-07-14 NOTE — Telephone Encounter (Signed)
07/14/2018 2034  From MR 07/06/2018 telephone note:   Let Mare Rommel know that very mild desats at night on on ONO 06/17/2018 - < 88% for 37 min only. He can try 1L o2 at night and see if it helps his dyspnea/energy in day time   Pt completed ONO with [facility]. Pt wife preferred [facility] for DME for 02 start.   Wyn Quaker FNP

## 2018-07-15 ENCOUNTER — Telehealth: Payer: Self-pay | Admitting: Internal Medicine

## 2018-07-15 NOTE — Telephone Encounter (Signed)
I checked the fax machine and I didn't see the ONO. I called Mickel Baas to let her know that we were waiting on [facility] to fax the report and that I would follow up with her once we receive it. She understood and will await our call.

## 2018-07-15 NOTE — Telephone Encounter (Signed)
We are awaiting ONO results to be faxed from [place]. I called them again to request a copy, I spoke to someone and they said they would fax it now to [phone]. I will await fax.      Referral Notes  Number of Notes: 4  Type Date User Summary Attachment  General 07/14/2018 4:29 PM Harland German - -  Note   Bethanne Ginger has the order and has placed a msg in triage/cb        Type Date User Summary Attachment  General 07/14/2018 12:25 PM Harland German - -  Note   Community Message sent to [facility] @ [place].         Type Date User Summary Attachment  General 07/14/2018 11:23 AM Harland German Waiting on signature. -  Note    Waiting on signature.         Type Date User Summary Attachment  Provider Comments 07/14/2018 11:08 AM Amado Coe, RN Provider Comments -  Note   Route (nasal cannula OR mask): nasal cannula Liter Flow: 1-2L Frequency (continuous with stationary and portable oxygen unit needed OR only at night): night only  Was this based off an ONO?: yes DME: [facility] Oxygen Conserving Device (Yes or No):  Type of Oxygen: Gas If new O2 start, please evaluate and titrate for best fit POC or portable O2 system.        Referral Order

## 2018-07-15 NOTE — Telephone Encounter (Signed)
Spoke to [place] with [facility], who confirmed that ONO is still needed in order to provide pt with oxygen.  Lm for [facility] requesting that ONO be faxed to our office.  ONO will need to be faxed to [place] at [phone].

## 2018-07-16 NOTE — Telephone Encounter (Signed)
Patient's wife is calling to check on status of O2 order, CB is [phone].

## 2018-07-16 NOTE — Telephone Encounter (Signed)
Received the ONO and faxed to [place] at [phone] and fax confirmation received. I called pt's wife to let her know. I advised her to call us back if they have'nt heard from them by this afternoon. Nothing further is needed at this time.

## 2018-07-19 NOTE — Telephone Encounter (Signed)
Has this ONO report been received? Thanks.

## 2018-07-19 NOTE — Telephone Encounter (Signed)
ONO was faxed to [place] by Jonelle Sidle 07/16/2018. Nothing further needed.

## 2018-07-20 ENCOUNTER — Ambulatory Visit (INDEPENDENT_AMBULATORY_CARE_PROVIDER_SITE_OTHER): Payer: Medicare Other | Admitting: Adult Health

## 2018-07-20 ENCOUNTER — Encounter: Payer: Self-pay | Admitting: Adult Health

## 2018-07-20 VITALS — BP 112/60 | Temp 97.9°F | Wt 168.0 lb

## 2018-07-20 DIAGNOSIS — J84112 Idiopathic pulmonary fibrosis: Secondary | ICD-10-CM | POA: Diagnosis not present

## 2018-07-20 DIAGNOSIS — N401 Enlarged prostate with lower urinary tract symptoms: Secondary | ICD-10-CM | POA: Diagnosis not present

## 2018-07-20 DIAGNOSIS — F419 Anxiety disorder, unspecified: Secondary | ICD-10-CM

## 2018-07-20 DIAGNOSIS — R351 Nocturia: Secondary | ICD-10-CM

## 2018-07-20 MED ORDER — TRAZODONE HCL 50 MG PO TABS: 25.0000 mg | ORAL_TABLET | Freq: Every evening | ORAL | 1 refills | Status: DC | PRN

## 2018-07-20 MED ORDER — TAMSULOSIN HCL 0.4 MG PO CAPS: 0.4000 mg | ORAL_CAPSULE | Freq: Every day | ORAL | 3 refills | Status: DC

## 2018-07-20 NOTE — Progress Notes (Signed)
Subjective:    Patient ID: Jason Summers, male    DOB: 1932/03/21, 85 y.o.   MRN: [id number]  HPI  85 year old male who  has a past medical history of Arthritis, Cancer ([place]), Hemorrhoids, Lateral epicondylitis (tennis elbow), Non-small cell lung cancer (NSCLC) ([place]) (dx'f 07/30/15), Odynophagia (11/06/2015), and Spondylosis of cervical joint. He presents with his wife   Is seen by Dr. Chase Caller due to history of age 85 non-small cell lung cancer and newer diagnosis of idiopathic pulmonary fibrosis.  He simply placed on oxygen at night and has been using a rescue inhaler as needed.  Was trialed on Spiriva but did not feel as though this was working well for him.  At his last pulmonary visit last week he was switched to Symbicort, 2 puffs twice daily.  Does report some increase in clear secretions when he coughs and when he blows his nose.  He was also started on Atarax 25 -50 mg every 6 hours as needed for anxiety and disturbance due to the new diagnosis.  He was advised to follow-up with me about this issue.  He has tried using Vistaril and has not noticed any significant improvement in anxiety or sleep disturbance.  He is also complaining of nocturia, reports getting up to use the bathroom "every 2-3 hours at nighttime".  Of course is also causing interruption in sleep.  Denies UTI-like symptoms.  He has had some form of BPH in the past for multiple years prescribed Flomax back in 2018 and 2019 but stopped taking this medication   Review of Systems See HPI   Past Medical History:  Diagnosis Date  . Arthritis   . Cancer ([place])    basal cell on right temple  . Hemorrhoids   . Lateral epicondylitis (tennis elbow)   . Non-small cell lung cancer (NSCLC) ([place]) dx'f 07/30/15  . Odynophagia 11/06/2015  . Spondylosis of cervical joint     Social History   Socioeconomic History  . Marital status: Married    Spouse name: Not on file  . Number of children: Not on file  . Years of education: Not  on file  . Highest education level: Not on file  Occupational History  . Occupation: Retired     Comment: Animal nutritionist  . Financial resource strain: Not on file  . Food insecurity:    Worry: Not on file    Inability: Not on file  . Transportation needs:    Medical: Not on file    Non-medical: Not on file  Tobacco Use  . Smoking status: Former Smoker    Packs/day: 0.20    Years: 62.00    Pack years: 12.40    Types: Cigarettes    Last attempt to quit: 03/10/2011    Years since quitting: 7.3  . Smokeless tobacco: Never Used  . Tobacco comment: smokes 1 ciagettes occasionally  Substance and Sexual Activity  . Alcohol use: Yes    Alcohol/week: 7.0 standard drinks    Types: 7 Glasses of wine per week    Comment: 1-2 glasses of wine a day  . Drug use: No  . Sexual activity: Yes  Lifestyle  . Physical activity:    Days per week: Not on file    Minutes per session: Not on file  . Stress: Not on file  Relationships  . Social connections:    Talks on phone: Not on file    Gets together: Not on file    Attends  religious service: Not on file    Active member of club or organization: Not on file    Attends meetings of clubs or organizations: Not on file    Relationship status: Not on file  . Intimate partner violence:    Fear of current or ex partner: Not on file    Emotionally abused: Not on file    Physically abused: Not on file    Forced sexual activity: Not on file  Other Topics Concern  . Not on file  Social History Narrative  . Not on file    Past Surgical History:  Procedure Laterality Date  . COLONOSCOPY  2003  . KNEE ARTHROSCOPY  2006   right    Family History  Problem Relation Age of Onset  . Hypertension Mother   . Heart disease Mother   . Coronary artery disease Unknown     Allergies  Allergen Reactions  . Gentamicin Other (See Comments)    Made eyes red, swollen, hot feeling  . Loteprednol-Tobramycin Other (See Comments)    Other  reaction(s): Other (See Comments) Made eyes Red, Swollen, Red, Warm feeling  Other reaction(s): Other (See Comments) Made eyes Red, Swollen, Red, Warm feeling  Made eyes Red, Swollen, Red, Warm feeling     Current Outpatient Medications on File Prior to Visit  Medication Sig Dispense Refill  . albuterol (PROVENTIL HFA;VENTOLIN HFA) 108 (90 Base) MCG/ACT inhaler Inhale 2 puffs into the lungs every 6 (six) hours as needed for wheezing or shortness of breath. 1 Inhaler 6  . budesonide-formoterol (SYMBICORT) 160-4.5 MCG/ACT inhaler Inhale 2 puffs into the lungs 2 (two) times daily. 1 Inhaler 0  . esomeprazole (NEXIUM) 40 MG capsule Take 1 capsule (40 mg total) by mouth daily at 12 noon. 90 capsule 3  . fish oil-omega-3 fatty acids 1000 MG capsule Take 2 g by mouth daily.     Marland Kitchen glucosamine-chondroitin 500-400 MG tablet Take 1 tablet by mouth daily. Reported on 09/11/2015    . hydrOXYzine (VISTARIL) 25 MG capsule Can take 1 tablet (25 mg) or 2 tablets every 6 hours as needed for anxiety.  This medication is sedating. 60 capsule 3  . OFEV 150 MG CAPS Take 1 tablet by mouth 2 (two) times daily.    . Tiotropium Bromide Monohydrate (SPIRIVA RESPIMAT) 2.5 MCG/ACT AERS Inhale 2 puffs into the lungs daily. (Patient not taking: Reported on 07/14/2018) 3 Inhaler 3  . vitamin B-12 (CYANOCOBALAMIN) 1000 MCG tablet Take 1,500 mcg by mouth daily.     No current facility-administered medications on file prior to visit.     BP 112/60   Temp 97.9 F (36.6 C)   Wt 168 lb (76.2 kg)   BMI 26.31 kg/m       Objective:   Physical Exam Vitals signs and nursing note reviewed.  Constitutional:      Appearance: Normal appearance.  HENT:     Nose: Nose normal.     Mouth/Throat:     Mouth: Mucous membranes are moist.     Pharynx: Oropharynx is clear.  Cardiovascular:     Rate and Rhythm: Normal rate and regular rhythm.     Pulses: Normal pulses.  Pulmonary:     Effort: Pulmonary effort is normal. Prolonged  expiration present.     Breath sounds: Normal breath sounds.  Skin:    Capillary Refill: Capillary refill takes less than 2 seconds.  Neurological:     General: No focal deficit present.     Mental  Status: He is alert.  Psychiatric:        Attention and Perception: Attention normal.        Mood and Affect: Mood is depressed. Affect is flat.        Speech: Speech normal.        Behavior: Behavior normal.        Thought Content: Thought content normal.        Cognition and Memory: Cognition normal.        Judgment: Judgment normal.       Assessment & Plan:  1. IPF (idiopathic pulmonary fibrosis) ([place]) - Continue with pulmonary treatment - Advised flonase may help with increase secretions  - Make sure he is also using normal saline spray due to being on oxygen at night   2. BPH associated with nocturia  - tamsulosin (FLOMAX) 0.4 MG CAPS capsule; Take 1 capsule (0.4 mg total) by mouth daily.  Dispense: 30 capsule; Refill: 3  3. Anxiety - Will d/c Visteral. Start on Trazodone. Can consider benzo in the future  - traZODone (DESYREL) 50 MG tablet; Take 0.5 tablets (25 mg total) by mouth at bedtime as needed for sleep.  Dispense: 90 tablet; Refill: 1 - He will follow up in the next two months for 6 months follow up- we can re evalaute then or follow up sooner   [name], NP

## 2018-07-28 ENCOUNTER — Telehealth: Payer: Self-pay | Admitting: Internal Medicine

## 2018-07-28 NOTE — Telephone Encounter (Signed)
Patient has been advised and added to Jason Summers Schedule will route to MR as [facility]

## 2018-07-28 NOTE — Telephone Encounter (Signed)
-  Stop fish oil - needs to come in 07/29/18 . If cannot see me - book with APP in side A or B and I can come and supervise. Concern is fibrosis getting worse or something else

## 2018-07-28 NOTE — Telephone Encounter (Signed)
Thanks  MR

## 2018-07-28 NOTE — Progress Notes (Signed)
_0  ID: Jason Summers, male    DOB: 1931-11-25, 85 y.o.   MRN: [id number]  Chief Complaint  Patient presents with  . Acute Visit    increased sob    Referring provider: [name], NP  HPI:  85 year old male former smoker followed in our office for idiopathic pulmonary fibrosis  PMH: GERD, lung cancer, radiation fibrosis of the lung, esophageal strictures, chest pain, headaches Smoker/ Smoking History: Former smoker Maintenance: Ofev Pt of: Dr. Chase Caller  07/29/2018  - Visit   85 year old male former smoker followed in our office for IPF.  Patient is currently maintained on 0FEV.  Patient is not having any current diarrhea.  Patient and wife do report that he had increased productive cough over the last 2 weeks with green mucus that reporting that this has been improving over the past 4 to 5 days.  He still does have a cough with green mucus but is significantly less from last week.  Patient has tried Symbicort and Spiriva 2.5 and he did not like either sample separately.  Patient has followed up with primary care who stopped hydroxyzine and started the patient on trazodone daily.  Patient presenting today over concerns of increased shortness of breath.  Patient also reporting his had difficulty sleeping over the last 2 to 3 weeks when he had his oxygen sat on 1-1/2 L.  This was recently changed to 2 L and patient reports that he slept much better last night.   Tests:   06/22/2018-CT chest with contrast- spectrum of findings suggestive of basilar predominant fibrotic interstitial lung disease with mild honeycombing asymmetrically involving the right lung with significant progression since February/2019 in September/2019 chest CT.  Findings consistent with UIP.  06/22/2018-echocardiogram- LV ejection fraction 55 to 60%, grade 1 diastolic dysfunction, PA P pressure 45  ONO 06/17/2018 - < 88% for 37 min only.  FENO:  No results found for: NITRICOXIDE  PFT: No flowsheet data  found.  Imaging: No results found.  SIX MIN WALK 07/29/2018 07/14/2018 06/25/2018 06/15/2018 10/28/2017 03/27/2017 08/14/2016  Supplimental Oxygen during Test? (L/min) _1  No No  Tech Comments: patient walked at average pace able to hold conversation but sounded winded. patient tolerated well. no desat patient walked at average pace, was a little short of breath walked all 3 laps, tolerated well. no desat Pt walked at a normal pace completing all required laps. Pt had mild SOB. Pt walked at a normal pace completing all required laps. Pt had moderate SOB and was "huffing and puffing" during the walk. steady walk, no complications TA/CMA Pt walked at a normal pace successfully completing all required laps. Pt's only complaint was mild SOB. Pt walked at moderate pace with steady gait.    07/29/18 - 6pt drop on walk today   Specialty Problems      Pulmonary Problems   Pulmonary nodules   Mass of lingula of lung   Non-small cell cancer of left lung ([place])    09/13/2015: Left lower lobe lung biopsy: Overall, the histologic features, in conjunction with the positive staining for cytokeratin 903, cytokeratin 5/6, and p63 support a diagnosis of poorly differentiated squamous cell carcinoma. There is likely sufficient tumor remaining for additional studies, if requested. (JBK:ds 09/17/15)        Pleural effusion on left   Pulmonary emphysema (H[facility])   Radiation fibrosis of lung ([facility])   Shortness of breath   IPF (idiopathic pulmonary fibrosis) ([place])    06/22/2018-CT chest with contrast-  spectrum of findings suggestive of basilar predominant fibrotic interstitial lung disease with mild honeycombing asymmetrically involving the right lung with significant progression since February/2019 in September/2019 chest CT.  Findings consistent with UIP.  06/22/2018-echocardiogram- LV ejection fraction 55 to 16%, grade 1 diastolic dysfunction, PA P pressure 45  ONO 06/17/2018 - < 88% for 37 min only.  07/07/2018  - started OFEV          Allergies  Allergen Reactions  . Gentamicin Other (See Comments)    Made eyes red, swollen, hot feeling  . Loteprednol-Tobramycin Other (See Comments)    Other reaction(s): Other (See Comments) Made eyes Red, Swollen, Red, Warm feeling  Other reaction(s): Other (See Comments) Made eyes Red, Swollen, Red, Warm feeling  Made eyes Red, Swollen, Red, Warm feeling     Immunization History  Administered Date(s) Administered  . Influenza Split 03/19/2011, 03/12/2012  . Influenza Whole 06/09/1997, 04/22/2007, 03/14/2008, 03/28/2009, 05/09/2010  . Influenza, High Dose Seasonal PF 04/14/2013, 04/11/2015, 03/02/2017, 03/23/2018  . Influenza,inj,Quad PF,6+ Mos 05/07/2016  . Influenza-Unspecified 04/10/2014  . Pneumococcal Conjugate-13 10/14/2013  . Pneumococcal Polysaccharide-23 06/09/2004, 10/04/2012  . Td 06/09/2005  . Zoster 02/04/2015   Up-to-date  Past Medical History:  Diagnosis Date  . Arthritis   . Cancer ([place])    basal cell on right temple  . Hemorrhoids   . Lateral epicondylitis (tennis elbow)   . Non-small cell lung cancer (NSCLC) ([place]) dx'f 07/30/15  . Odynophagia 11/06/2015  . Spondylosis of cervical joint     Tobacco History: Social History   Tobacco Use  Smoking Status Former Smoker  . Packs/day: 0.20  . Years: 62.00  . Pack years: 12.40  . Types: Cigarettes  . Last attempt to quit: 03/10/2011  . Years since quitting: 7.3  Smokeless Tobacco Never Used  Tobacco Comment   smokes 1 ciagettes occasionally   Counseling given: Not Answered Comment: smokes 1 ciagettes occasionally  Continue to not smoke  Outpatient Encounter Medications as of 07/29/2018  Medication Sig  . albuterol (PROVENTIL HFA;VENTOLIN HFA) 108 (90 Base) MCG/ACT inhaler Inhale 2 puffs into the lungs every 6 (six) hours as needed for wheezing or shortness of breath.  . budesonide-formoterol (SYMBICORT) 160-4.5 MCG/ACT inhaler Inhale 2 puffs into the lungs 2 (two)  times daily.  Marland Kitchen esomeprazole (NEXIUM) 40 MG capsule Take 1 capsule (40 mg total) by mouth daily at 12 noon.  Marland Kitchen glucosamine-chondroitin 500-400 MG tablet Take 1 tablet by mouth daily. Reported on 09/11/2015  . OFEV 150 MG CAPS Take 1 tablet by mouth 2 (two) times daily.  . tamsulosin (FLOMAX) 0.4 MG CAPS capsule Take 1 capsule (0.4 mg total) by mouth daily.  . traZODone (DESYREL) 50 MG tablet Take 0.5 tablets (25 mg total) by mouth at bedtime as needed for sleep.  . vitamin B-12 (CYANOCOBALAMIN) 1000 MCG tablet Take 1,500 mcg by mouth daily.  . [DISCONTINUED] fish oil-omega-3 fatty acids 1000 MG capsule Take 2 g by mouth daily.   Marland Kitchen doxycycline (VIBRA-TABS) 100 MG tablet Take 1 tablet (100 mg total) by mouth 2 (two) times daily.  . predniSONE (DELTASONE) 10 MG tablet Take as prescribed on AVS.  . Tiotropium Bromide Monohydrate (SPIRIVA RESPIMAT) 2.5 MCG/ACT AERS Inhale 2 puffs into the lungs daily. (Patient not taking: Reported on 07/29/2018)   No facility-administered encounter medications on file as of 07/29/2018.      Review of Systems  Review of Systems  Constitutional: Positive for fatigue. Negative for activity change, chills, fever and unexpected weight  change.  HENT: Positive for congestion (Increased over the past 2 weeks, Flonase has helped) and postnasal drip. Negative for rhinorrhea, sinus pressure, sinus pain, sneezing and sore throat.   Eyes: Negative.   Respiratory: Positive for cough (Productive cough over the last 2 weeks with green mucus, patient reporting that the cough has improved slightly over the last 4 to 5 days and he is coughing less), chest tightness (When he feels short of breath) and shortness of breath. Negative for wheezing.   Cardiovascular: Negative for chest pain and palpitations.  Gastrointestinal: Negative for constipation, diarrhea, nausea and vomiting.  Endocrine: Negative.   Musculoskeletal: Negative.   Skin: Negative.   Neurological: Negative for  dizziness and headaches.  Psychiatric/Behavioral: Positive for dysphoric mood. The patient is nervous/anxious.   All other systems reviewed and are negative.    Physical Exam  BP 134/72 (BP Location: Left Arm, Cuff Size: Normal)   Pulse 97   Ht _0  (1.702 m)   Wt 170 lb 6.4 oz (77.3 kg)   SpO2 93%   BMI 26.69 kg/m   Wt Readings from Last 5 Encounters:  07/29/18 170 lb 6.4 oz (77.3 kg)  07/20/18 168 lb (76.2 kg)  07/14/18 170 lb 12.8 oz (77.5 kg)  06/25/18 171 lb 3.2 oz (77.7 kg)  06/15/18 170 lb (77.1 kg)   RA  Physical Exam  Constitutional: He is oriented to person, place, and time and well-developed, well-nourished, and in no distress. Vital signs are normal. No distress.  + Frail elderly male  HENT:  Head: Normocephalic and atraumatic.  Right Ear: Hearing, tympanic membrane, external ear and ear canal normal.  Left Ear: Hearing, tympanic membrane, external ear and ear canal normal.  Nose: Mucosal edema present. Right sinus exhibits no maxillary sinus tenderness and no frontal sinus tenderness. Left sinus exhibits no maxillary sinus tenderness and no frontal sinus tenderness.  Mouth/Throat: Uvula is midline and oropharynx is clear and moist. No oropharyngeal exudate.  +post nasal drip   Eyes: Pupils are equal, round, and reactive to light.  Neck: Normal range of motion. Neck supple.  Cardiovascular: Normal rate, regular rhythm and normal heart sounds.  Occasional extrasystoles are present.  Pulmonary/Chest: Effort normal and breath sounds normal. No accessory muscle usage. No respiratory distress. He has no decreased breath sounds. He has no wheezes. He has no rhonchi.  Musculoskeletal: Normal range of motion.        General: No edema.  Lymphadenopathy:    He has no cervical adenopathy.  Neurological: He is alert and oriented to person, place, and time. Gait normal.  Skin: Skin is warm and dry. He is not diaphoretic. No erythema.  Psychiatric: Mood, memory, affect and  judgment normal.  Nursing note and vitals reviewed.     Lab Results:  CBC    Component Value Date/Time   WBC 8.0 06/15/2018 1224   RBC 4.79 06/15/2018 1224   HGB 13.1 06/15/2018 1224   HGB 13.1 02/12/2018 0841   HGB 14.3 01/27/2017 0826   HCT 39.1 06/15/2018 1224   HCT 43.0 01/27/2017 0826   PLT 245.0 06/15/2018 1224   PLT 214 02/12/2018 0841   PLT 183 01/27/2017 0826   MCV 81.5 06/15/2018 1224   MCV 86.4 01/27/2017 0826   MCH 28.7 02/12/2018 0841   MCHC 33.4 06/15/2018 1224   RDW 15.3 06/15/2018 1224   RDW 14.5 01/27/2017 0826   LYMPHSABS 1.1 06/15/2018 1224   LYMPHSABS 1.2 01/27/2017 0826   MONOABS 0.9 06/15/2018 1224  MONOABS 0.6 01/27/2017 0826   EOSABS 0.2 06/15/2018 1224   EOSABS 0.1 01/27/2017 0826   BASOSABS 0.1 06/15/2018 1224   BASOSABS 0.0 01/27/2017 0826    BMET    Component Value Date/Time   NA 133 (L) 06/15/2018 1224   NA 139 01/27/2017 0826   K 4.3 06/15/2018 1224   K 4.4 01/27/2017 0826   CL 100 06/15/2018 1224   CO2 26 06/15/2018 1224   CO2 25 01/27/2017 0826   GLUCOSE 102 (H) 06/15/2018 1224   GLUCOSE 102 01/27/2017 0826   BUN 20 06/15/2018 1224   BUN 15.5 01/27/2017 0826   CREATININE 0.89 06/15/2018 1224   CREATININE 0.90 02/12/2018 0841   CREATININE 0.9 01/27/2017 0826   CALCIUM 8.9 06/15/2018 1224   CALCIUM 9.1 01/27/2017 0826   GFRNONAA >60 02/12/2018 0841   GFRAA >60 02/12/2018 0841    BNP    Component Value Date/Time   BNP 52.0 08/21/2015 1206    ProBNP No results found for: PROBNP    Assessment & Plan:     IPF (idiopathic pulmonary fibrosis) ([place]) Assessment: UIP on January/2020 CT chest Started 05 on 07/07/2018, currently not having any side effects Walk in office on 07/14/2018 confirmed patient did not need oxygen, only two-point oxygen desaturation when walking Walk in office today (07/29/2018) showed a six-point drop in oxygenation from 97% to 91% Patient currently maintained on 2 L of O2 at night Patient still  has not completed pulmonary function testing which is currently scheduled for 08/12/2018 Lungs clear to auscultation today  Plan: We will treat as potential IPF flare with myriad of symptoms Walk in office today Prednisone taper Doxycycline today Continue OFEV Continue 2 L of O2 at night Continue scheduled follow-up with Dr. Chase Caller on 08/12/2018 with pulmonary function test Can continue rescue inhaler  We will consider chronic inhaler reevaluation after completing pulmonary function test   Shortness of breath Assessment: Increased shortness of breath especially at night over the last 2 to 3 weeks Patient reporting he has had bronchitis-like symptoms for the last 2 weeks with a productive cough with green mucus Patient reporting the cough has improved some over the last 4 to 5 days Patient with UIP pattern on January/2020 CT Still has not completed pulmonary function testing Lungs clear to auscultation today Increased point differential 1 walked in office today from 07/14/2018 walk  Plan: Differential would be IPF flare versus slow to resolve bronchitis Prednisone taper today Doxycycline today Continue 2 L of O2 at night Walk today in office patient did not require oxygen with exertion Keep close follow-up with our office on 08/12/2018 with a pulmonary function test  Anxiety Assessment: Currently managed by PCP Patient was started on trazodone Hydroxyzine was DC'd Patient still having episodes of anxiety throughout the day as well as at night Patient reports improved anxiety and sleep last night when he was maintained on 2 L O2  Plan: Continue trazodone as prescribed If anxiety episodes occur again follow-up with primary care about daytime anxiety management as well as potentially adding another agent for breakthrough anxiety Keep close follow-up with our office on 08/12/2018     Lauraine Rinne, NP 07/29/2018   This appointment was 45 min long with over 50% of the time in  direct face-to-face patient care, assessment, plan of care, and follow-up.

## 2018-07-28 NOTE — Telephone Encounter (Signed)
Called and spoke with the patient and his wife they state that for the last four nights while wearing oxygen. The patient has had increased sob while wearing a his oxygen and pressure in his chest. He dines any fever,cough or congestion. While sleeping he feels the need to take it of once he does it relieves the pressure at that time but in the morning while waking up he is SOB.  On 1-2 l at night only . He would like to know what he should do and if this is normal. Did not want an appointment yet wanted Mr recs first.    Mr please advise

## 2018-07-29 ENCOUNTER — Encounter: Payer: Self-pay | Admitting: Pulmonary Disease

## 2018-07-29 ENCOUNTER — Ambulatory Visit (INDEPENDENT_AMBULATORY_CARE_PROVIDER_SITE_OTHER): Payer: Medicare Other | Admitting: Pulmonary Disease

## 2018-07-29 VITALS — BP 134/72 | HR 97 | Ht 67.0 in | Wt 170.4 lb

## 2018-07-29 DIAGNOSIS — R0602 Shortness of breath: Secondary | ICD-10-CM | POA: Diagnosis not present

## 2018-07-29 DIAGNOSIS — J84112 Idiopathic pulmonary fibrosis: Secondary | ICD-10-CM

## 2018-07-29 DIAGNOSIS — F419 Anxiety disorder, unspecified: Secondary | ICD-10-CM | POA: Diagnosis not present

## 2018-07-29 MED ORDER — DOXYCYCLINE HYCLATE 100 MG PO TABS: 100.0000 mg | ORAL_TABLET | Freq: Two times a day (BID) | ORAL | 0 refills | Status: DC

## 2018-07-29 MED ORDER — PREDNISONE 10 MG PO TABS: ORAL_TABLET | ORAL | 0 refills | Status: DC

## 2018-07-29 NOTE — Patient Instructions (Addendum)
Prednisone 12m tablet  >>>4 tabs for 3 days, then 3 tabs for 3 days, 2 tabs for 3 days, then 1 tab for 3 days, then stop >>>take with food  >>>take in the morning   Doxycycline >>> 1 100 mg tablet every 12 hours for 7 days >>>take with food  >>>wear sunscreen    Walk today in office  >>> Did not drop below 88% to require oxygen with exertion >>>Slight increase in the drop from 07/14/2018 walk  Continue OFEV as prescribed  Continue oxygen therapy as prescribed - 2 L at night  >>>maintain oxygen saturations greater than 88-90 percent  >>>if unable to maintain oxygen saturations please contact the office  >>>do not smoke with oxygen  >>>can use nasal saline gel or nasal saline rinses to moisturize nose if oxygen causes dryness  Purchase an SP02 monitor to monitor oxygenation  >>> Can get one from your local pharmacy or [email or web address]  You may need to contact primary care to discuss your continued anxiety >>> Continue trazodone as prescribed >>> Can discuss with primary care if there is a daytime medication that you can take for management of anxiety or for breakthrough anxiety     Keep scheduled appointment with Dr. RChase Calleron 08/12/2018 with PFT >>> We will decide on chronic inhaler use after we complete the breathing test on 08/12/2018      It is flu season:   >>> Best ways to protect herself from the flu: Receive the yearly flu vaccine, practice good hand hygiene washing with soap and also using hand sanitizer when available, eat a nutritious meals, get adequate rest, hydrate appropriately   Please contact the office if your symptoms worsen or you have concerns that you are not improving.   Thank you for choosing [facility] for your healthcare, and for allowing [facility] partner with you on your healthcare journey. I am thankful to be able to provide care to you today.   BWyn QuakerFNP-C

## 2018-07-29 NOTE — Assessment & Plan Note (Signed)
Assessment: Currently managed by PCP Patient was started on trazodone Hydroxyzine was DC'd Patient still having episodes of anxiety throughout the day as well as at night Patient reports improved anxiety and sleep last night when he was maintained on 2 L O2  Plan: Continue trazodone as prescribed If anxiety episodes occur again follow-up with primary care about daytime anxiety management as well as potentially adding another agent for breakthrough anxiety Keep close follow-up with our office on 08/12/2018

## 2018-07-29 NOTE — Assessment & Plan Note (Signed)
Assessment: Increased shortness of breath especially at night over the last 2 to 3 weeks Patient reporting he has had bronchitis-like symptoms for the last 2 weeks with a productive cough with green mucus Patient reporting the cough has improved some over the last 4 to 5 days Patient with UIP pattern on January/2020 CT Still has not completed pulmonary function testing Lungs clear to auscultation today Increased point differential 1 walked in office today from 07/14/2018 walk  Plan: Differential would be IPF flare versus slow to resolve bronchitis Prednisone taper today Doxycycline today Continue 2 L of O2 at night Walk today in office patient did not require oxygen with exertion Keep close follow-up with our office on 08/12/2018 with a pulmonary function test

## 2018-07-29 NOTE — Assessment & Plan Note (Signed)
Assessment: UIP on January/2020 CT chest Started 05 on 07/07/2018, currently not having any side effects Walk in office on 07/14/2018 confirmed patient did not need oxygen, only two-point oxygen desaturation when walking Walk in office today (07/29/2018) showed a six-point drop in oxygenation from 97% to 91% Patient currently maintained on 2 L of O2 at night Patient still has not completed pulmonary function testing which is currently scheduled for 08/12/2018 Lungs clear to auscultation today  Plan: We will treat as potential IPF flare with myriad of symptoms Walk in office today Prednisone taper Doxycycline today Continue OFEV Continue 2 L of O2 at night Continue scheduled follow-up with Dr. Chase Caller on 08/12/2018 with pulmonary function test Can continue rescue inhaler  We will consider chronic inhaler reevaluation after completing pulmonary function test

## 2018-08-12 ENCOUNTER — Encounter: Payer: Self-pay | Admitting: Internal Medicine

## 2018-08-12 ENCOUNTER — Ambulatory Visit (INDEPENDENT_AMBULATORY_CARE_PROVIDER_SITE_OTHER): Payer: Medicare Other | Admitting: Internal Medicine

## 2018-08-12 VITALS — BP 126/70 | HR 83 | Ht 66.0 in | Wt 165.0 lb

## 2018-08-12 DIAGNOSIS — R5381 Other malaise: Secondary | ICD-10-CM

## 2018-08-12 DIAGNOSIS — J84112 Idiopathic pulmonary fibrosis: Secondary | ICD-10-CM

## 2018-08-12 DIAGNOSIS — F419 Anxiety disorder, unspecified: Secondary | ICD-10-CM

## 2018-08-12 DIAGNOSIS — R0602 Shortness of breath: Secondary | ICD-10-CM

## 2018-08-12 DIAGNOSIS — Z5181 Encounter for therapeutic drug level monitoring: Secondary | ICD-10-CM | POA: Diagnosis not present

## 2018-08-12 DIAGNOSIS — T50905A Adverse effect of unspecified drugs, medicaments and biological substances, initial encounter: Secondary | ICD-10-CM

## 2018-08-12 DIAGNOSIS — R634 Abnormal weight loss: Secondary | ICD-10-CM

## 2018-08-12 DIAGNOSIS — J439 Emphysema, unspecified: Secondary | ICD-10-CM | POA: Diagnosis not present

## 2018-08-12 LAB — HEPATIC FUNCTION PANEL
ALT: 26 U/L (ref 0–53)
AST: 17 U/L (ref 0–37)
Albumin: 4 g/dL (ref 3.5–5.2)
Alkaline Phosphatase: 88 U/L (ref 39–117)
Bilirubin, Direct: 0.1 mg/dL (ref 0.0–0.3)
Total Bilirubin: 0.4 mg/dL (ref 0.2–1.2)
Total Protein: 7.4 g/dL (ref 6.0–8.3)

## 2018-08-12 MED ORDER — UMECLIDINIUM BROMIDE 62.5 MCG/INH IN AEPB: 1.0000 | INHALATION_SPRAY | Freq: Every day | RESPIRATORY_TRACT | 0 refills | Status: DC

## 2018-08-12 NOTE — Progress Notes (Signed)
Patient seen in the office today and instructed on use of Incruse Ellipta.  Patient expressed understanding and demonstrated technique.

## 2018-08-12 NOTE — Progress Notes (Signed)
IOV 12/16/2016  Chief Complaint  Patient presents with  . Acute Visit    Pt c/o chest tightness, mid back pain, and right lateral rib pain when pt takes a deep breath in. Pt also c/o mild dry cough with some chest congestion. Pt denies f/c/s.     85 year old male with stage III non-small cell lung cancer and resultant radiation related pleural effusion. This supposed to go to [place] his native country in June 2018 but he canceled because of his declining health. He tells me that his functional status is pretty good. His appetite is pretty good. However he is having worsening lower thoracic back pain. He saw my colleague approximately one month ago. Chest x-ray was unchanged. He was advised nonsteroidal anti-inflammatory drugs and Tylenol for pain relief. But now the pain has broken through. The pain is worse. It is present in the midthoracic right infra-axillary and lower anterior chest area. It gets worse with inspiration. It is severe. He feels he might need something stronger for pain relief. There is no neurologic deficit. There is no constipation. His effort tolerance is okay with there is slight increased fatigue.   OV 03/27/2017  Chief Complaint  Patient presents with  . Follow-up    Pt still has some pain in his back but it is better than it ws at last visit. Pt has noticed that he will occ. lose his voice and he has become weak, especially in the mornings. [name] states that pt has been taking some OTC for a cough and states that occ. it will be hard for him to breathe and does have occ. CP.    85 year old male with non-small cell lung cancer on observation therapy. I last saw him in July 2018. At that time he was bothered by left-sided chest pain. There was concern about cancer recurrence but a PET scan at that time showed improvement. Since then he's been on observation therapy.He now presents with his wife. He tells me that the pain is not much of an issue. He only has very  mild shortness of breath. But they both tell me that he is extremely fatigued. Particularly early in the morning. He has insomnia. Wife does think that he is deconditioned. Last lab work in August 2018 was fine. Has no symptoms of COPD exacerbation. He is up-to-date with his flu shot.he has had some headaches and neurology for that He might be depressed Walking desaturation test 185 feet 3 laps on room air: He finished it without any problems other than mild shortness of breath. Resting heart rate was 87/m. Final heart rate was 98/m. Resting pulse ox 100% final pulse ox 98%.   OV 06/29/2017  Chief Complaint  Patient presents with  . Follow-up    Pt and his wife states he is doing much better since last visit. Pt has been going to PT which has helped him gain strengh back, walking better, and brething better.   He has mild emphysema and this is follow-up for that.  He is here with his wife.  They tell me that he is not taking his inhalers.  Because it really does not benefit him.  The bigger issues that have been recently dealing with other issues of fatigue and low motivation and physical deconditioning.  Therefore at last visit we put him into pulmonary rehabilitation.  We checked a lot of his blood work it ended up that his vitamin D was very low.  Put him on vitamin D  replacement.  Now he reports that with both vitamin D replacement and with pulmonary rehabilitation he has significant improvement in his fatigue and energy levels.  He feels back to baseline.  In particular tl pulmonary rehabilitation has helped him.  They both are worried about the extent of lung cancer and the odds of recurrence.  The having follow-up CT scan with Dr. Julien Summers in a month or 2.  They feel that if his physical condition will allow and the CT scan does not show any recurrence he would want to visit [place] in the summer 2019.  Otherwise feeling good.   OV 10/28/2017  Chief Complaint  Patient presents with  . Follow-up     Pt states in the mornings it is hard to breathe. States he believes the fatigue has become better.   Jason Summers presents for follow-up with his wife.  The issues to be discussed are emphysema, shortness of breath and fatigue with vitamin D deficiency in the setting of lung cancer on observation therapy.  In addition there is travel advice coming up in 2 weeks he is going to [place] for a few months.  Overall stable.  He is no longer doing pulmonary rehab.  He uses only albuterol as needed because he does not want to do maintenance inhalers.  We told him to do his vitamin D once a month but I am not so sure as to what regimen he is following right now.  In the interim it appears that exertional dyspnea slightly worse although the fatigue component might be the same.  Wife feels is because he is anxious and because he wants to do more than his body is allowing him to do.  He does not follow the pulmonary rehabilitation home prescription accurately.  I reviewed the old chart found that Dr. Julien Summers has placed him on observation therapy after seeing him in March 2019.  Apparently there is a scan coming up in September 2019.  He clearly wants to be able to do more.  Walking desaturation test he got tachycardic and did not desaturate below 97%.  He is going to [place] early part of June 2019.  Is a long flight to [place].  Given the persistence of his dyspnea we did a deep dive with an objective dyspnea questionnaire.  It appears that the worst his dyspnea is level 3 out of 5.  He is level 3 for walking on a level with others of his age and walking uphill, picking up and straightening.  He is a level 2 dyspnea walking upstairs and washing the car.  He is a level 1 dyspnea for dressing watering the lawn sexual activities.  But is a level 0 dyspnea while eating or standing up from a chair of brushing teeth or showering or shaving.  It is associated with fatigue and arthralgia     DIAGNOSIS: Stage IIIA (T3, N2,  M0) non-small cell lung cancer, poorly differentiated squamous cell carcinoma diagnosed in February 2017 with PDL 1 expression of 35%, presented with large left lower lobe lung mass in addition to left upper lobe lung nodule and mediastinal lymphadenopathy. PRIOR THERAPY: Course of concurrent chemoradiation with weekly carboplatin for AUC of 2 and paclitaxel 45 MG/M2. Status post 7 cycles, last dose was given 11/19/2015 with partial response.  CURRENT THERAPY: Observation.       OV 03/03/2018  Subjective:  Patient ID: Jason Summers, male , DOB: 02-Feb-1932 , age 30 y.o. , MRN: [id number] , ADDRESS: [id number]  [place] [address]   03/03/2018 -   Chief Complaint  Patient presents with  . Follow-up    SOB with exertion, still on Spiriva and feels it is working      HPI [employer] 85 y.o. -follow-up for shortness of breath related to radiation fibrosis of the lung, mild emphysema and physical deconditioning.  After last visit in May 2019 we started him on Spiriva.  He says this helped only a little bit.  He still has fixed dyspnea on exertion relieved by rest.  He was in [place] and this limited his exertion at various places.  However he says overall he is stable since last visit.  He is definitely not worse.  No associated chest pain or wheezing or cough.  He had a CT scan of the chest September 2019 that I personally visualized and agree with the report that this shows radiation related changes but no active cancer.  Also for the last few months he is on and off hoarseness of voice with variable tone in his voice.  Wife thinks is because of allergies.  Wife feels that patient has fixed issues and needs to accept his quality of life and status and health but patient feels that he constantly wants to get better.  No fever      OV 06/15/2018  Subjective:  Patient ID: Jason Summers, male , DOB: [birth year] , age 67 y.o. , MRN: [id number] , ADDRESS: [place]  [id number]   06/15/2018 -   Chief Complaint  Patient presents with  . Follow-up    Since the dx of lung cancer 3 years ago, pt has has had more problems with his breathing and has to stop multiple times while walking to catch his breath. Pt has an occ cough but states he has been having a lot of headaches and fatigue. Pt has also been having problems with shaking and also unable to talk to people due the SOB.     HPI Barbara Lamere 85 y.o. -presents acutely with his wife.  Since I last saw him in September 2019 he has had worsening shortness of breath.  This is to the point now that when he climbs a flight of stairs that he is panting quite a bit.  He also has to stop while he climbs.  Normal exertion is now becoming difficult.  It takes a long time for him to recover to the point that he is not able to talk immediately after exertion.  At rest he feels fine.  The wife also states is significantly depressed.  He is asking for himself to be checked into a nursing home which the wife is reluctant to do.  He is having nonspecific headaches that are chronic in nature.  But when he bends his head down it gets worse.  They believe in the last few months this is also worse.  There is no chest pain or wheezing or pedal edema or hemoptysis or weight loss.  OV 06/25/2018  Subjective:  Patient ID: Jason Summers, male , DOB: 1932-05-04 , age 8 y.o. , MRN: [id number] , ADDRESS: [facility] [address]   06/25/2018 -   Chief Complaint  Patient presents with  . Follow-up    Pt states he is about the same as last visit. SOB is about the same and pt is still fatigued.     HPI Jason Summers 85 y.o. -presents for follow-up after work-up.  He had CT chest June 22, 2018 that I personally visualized.  The radiologist reporting presence of UIP with honeycombing.  I visualized this and agree with the findings.  He had echocardiogram June 22, 2018 that shows grade 1 diastolic dysfunction and slightly  elevated pulmonary artery systolic pressure with ejection fraction 55-60%.  In retrospect his CT chest from early 2019 shows ILD findings.  This corresponds with the time that he has been getting worse.  Walking desaturation test still shows adequate oxygenation but tendency to drop.  Compared to his last visit there are no new interim problems.  He is not interested in doing pulmonary rehabilitation.  At this point in time discussion focused on IPF diagnosis, prognosis and treatment.  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  He smoked in the remote past.  He did get chemo and radiation for his lung few to several years ago          OV 08/12/2018  Subjective:  Patient ID: Jason Summers, male , DOB: 02-08-1932 , age 89 y.o. , MRN: [id number] , ADDRESS: [place] [id number]   08/12/2018 -   Chief Complaint  Patient presents with  . Follow-up    PFT attempted but pt was unable to complete. Pt states breathing has become worse since last visit. States he was wearing O2 at night but then began to become choked from it and had to stop wearing it. Pt also attempted to wear the O2 during the day as needed and had same problems with it. Pt also has had some chest tightness when he is unable to get his breath. Denies any complaints of cough.     HPI Jason Summers 85 y.o. -presents 5-year follow-up.  He presents with his wife.  He is now been on nintedanib since late January 2020.  He says he is tolerating it well although he has had several pound weight loss.  But other than this is not having any side effects from the nintedanib.  He was started on nighttime oxygen but he says he does not want to use it because the oxygen tubing is choking him.  His wife and he are very concerned about the level of shortness of breath.  They feel there is a decline in the last 1 year which we do know is from progressive ILD.  They do agree that it does not decline since early 2020/January 2020.  Nevertheless  the symptom burden is very high and this is documented below.  Wife states that he is very despondent and anxious and apparently this is his baseline.  He is also less active.  Wife states that he is always focused on the shortness of breath.  Multiple different questions about how to get better and move forward.  Questions about IPF prognosis.  Also with the upcoming pandemic of the coronavirus-asking for travel advice.  They do have family visiting from [place] and [place] by air.  His walking desaturation test today shows stability compared to the previous visit.  SYMPTOM SCALE - ILD 08/12/2018 Weight 165#  O2 use ra  Shortness of Breath 0 -> 5 scale with 5 being worst (score 6 If unable to do)  At rest 3  Simple tasks - showers, clothes change, eating, shaving 4  Household (dishes, doing bed, laundry) X -does not do.  Shopping 3  Walking level at own pace 4  Walking keeping up with others of same age 60  Walking up Stairs 3  Walking up [place]  4  Total (40 - 48) Dyspnea Score 25  How bad is your cough? 0  How bad is your fatigue 4       Simple office walk 185 feet x  3 laps goal with forehead probe 10/28/2017  06/15/2018 [place] - 250 feet x 3 laps 06/25/2018 Weight 171# 08/12/2018 ofev x 4-6 weeks. Unable to do PFT - weight 165#  O2 used Room  Room air Room air Room air  Number laps completed _0 las x 25o feet 3  Comments about pace Good pace normal normal normal  Resting Pulse Ox/HR 100% and 83/min 97% and 87/min 98% and 96/min 99% and 83/min  Final Pulse Ox/HR 97% and 96/min 91% and 101/mikn 90% and 110/min 91% and 102/min  Desaturated </= 88% no no no no  Desaturated <= 3% points yds Yes, 6 points Yes, 8 points Yes, 8 points  Got Tachycardic >/= 90/min yes yes yes yes  Symptoms at end of test x Moderate dyspnea Mild dysnea Mild dyspnea  Miscellaneous comments x ? worse  Not using night o2     ROS - per HPI     has a past medical history of Arthritis, Cancer ([facility]),  Hemorrhoids, Lateral epicondylitis (tennis elbow), Non-small cell lung cancer (NSCLC) ([place]) (dx'f 07/30/15), Odynophagia (11/06/2015), and Spondylosis of cervical joint.   reports that he quit smoking about 7 years ago. His smoking use included cigarettes. He has a 12.40 pack-year smoking history. He has never used smokeless tobacco.  Past Surgical History:  Procedure Laterality Date  . COLONOSCOPY  2003  . KNEE ARTHROSCOPY  2006   right    Allergies  Allergen Reactions  . Gentamicin Other (See Comments)    Made eyes red, swollen, hot feeling  . Loteprednol-Tobramycin Other (See Comments)    Other reaction(s): Other (See Comments) Made eyes Red, Swollen, Red, Warm feeling  Other reaction(s): Other (See Comments) Made eyes Red, Swollen, Red, Warm feeling  Made eyes Red, Swollen, Red, Warm feeling     Immunization History  Administered Date(s) Administered  . Influenza Split 03/19/2011, 03/12/2012  . Influenza Whole 06/09/1997, 04/22/2007, 03/14/2008, 03/28/2009, 05/09/2010  . Influenza, High Dose Seasonal PF 04/14/2013, 04/11/2015, 03/02/2017, 03/23/2018  . Influenza,inj,Quad PF,6+ Mos 05/07/2016  . Influenza-Unspecified 04/10/2014  . Pneumococcal Conjugate-13 10/14/2013  . Pneumococcal Polysaccharide-23 06/09/2004, 10/04/2012  . Td 06/09/2005  . Zoster 02/04/2015    Family History  Problem Relation Age of Onset  . Hypertension Mother   . Heart disease Mother   . Coronary artery disease Unknown      Current Outpatient Medications:  .  esomeprazole (NEXIUM) 40 MG capsule, Take 1 capsule (40 mg total) by mouth daily at 12 noon., Disp: 90 capsule, Rfl: 3 .  glucosamine-chondroitin 500-400 MG tablet, Take 1 tablet by mouth daily. Reported on 09/11/2015, Disp: , Rfl:  .  OFEV 150 MG CAPS, Take 1 tablet by mouth 2 (two) times daily., Disp: , Rfl:  .  traZODone (DESYREL) 50 MG tablet, Take 0.5 tablets (25 mg total) by mouth at bedtime as needed for sleep., Disp: 90 tablet, Rfl:  1 .  vitamin B-12 (CYANOCOBALAMIN) 1000 MCG tablet, Take 1,500 mcg by mouth daily., Disp: , Rfl:  .  albuterol (PROVENTIL HFA;VENTOLIN HFA) 108 (90 Base) MCG/ACT inhaler, Inhale 2 puffs into the lungs every 6 (six) hours as needed for wheezing or shortness of breath., Disp: 1 Inhaler, Rfl: 6 .  tamsulosin (FLOMAX) 0.4 MG CAPS capsule, Take 1 capsule (0.4  mg total) by mouth daily. (Patient not taking: Reported on 08/12/2018), Disp: 30 capsule, Rfl: 3 .  Tiotropium Bromide Monohydrate (SPIRIVA RESPIMAT) 2.5 MCG/ACT AERS, Inhale 2 puffs into the lungs daily. (Patient not taking: Reported on 07/29/2018), Disp: 3 Inhaler, Rfl: 3      Objective:   Vitals:   08/12/18 1116  BP: 126/70  Pulse: 83  SpO2: 99%  Weight: 165 lb (74.8 kg)  Height: 5' 6" (1.676 m)    Estimated body mass index is 26.63 kg/m as calculated from the following:   Height as of this encounter: 5' 6" (1.676 m).   Weight as of this encounter: 165 lb (74.8 kg).  _0 @  Filed Weights   08/12/18 1116  Weight: 165 lb (74.8 kg)     Physical Exam  General Appearance:    Alert, cooperative, no distress, appears stated age - m , Deconditioned looking - YES , OBESE  - no, Sitting on Wheelchair -  no  Head:    Normocephalic, without obvious abnormality, atraumatic  Eyes:    PERRL, conjunctiva/corneas clear,  Ears:    Normal TM's and external ear canals, both ears  Nose:   Nares normal, septum midline, mucosa normal, no drainage    or sinus tenderness. OXYGEN ON  - NP . Patient is @ RA   Throat:   Lips, mucosa, and tongue normal; teeth and gums normal. Cyanosis on lips - npo  Neck:   Supple, symmetrical, trachea midline, no adenopathy;    thyroid:  no enlargement/tenderness/nodules; no carotid   bruit or JVD  Back:     Symmetric, no curvature, ROM normal, no CVA tenderness  Lungs:     Distress - no , Wheeze no, Barrell Chest - no, Purse lip breathing - no, Crackles - YES at base   Chest Wall:    No tenderness or  deformity.    Heart:    Regular rate and rhythm, S1 and S2 normal, no rub   or gallop, Murmur - no  Breast Exam:    NOT DONE  Abdomen:     Soft, non-tender, bowel sounds active all four quadrants,    no masses, no organomegaly. Visceral obesity - no  Genitalia:   NOT DONE  Rectal:   NOT DONE  Extremities:   Extremities - normal, Has Cane - no, Clubbing - no, Edema - no  Pulses:   2+ and symmetric all extremities  Skin:   Stigmata of Connective Tissue Disease - no  Lymph nodes:   Cervical, supraclavicular, and axillary nodes normal  Psychiatric:  Neurologic:   Pleasant - YES, Anxious - YES, Flat affect - YES VERY   CAm-ICU - neg, Alert and Oriented x 3 - yes, Moves all 4s - yes, Speech - normal, Cognition - intact           Assessment:       ICD-10-CM   1. IPF (idiopathic pulmonary fibrosis) ([place]) J84.112   2. Pulmonary emphysema, unspecified emphysema type ([place]) J43.9   3. Anxiety F41.9   4. Physical deconditioning R53.81   5. Drug-induced weight loss R63.4    T50.905A   6. Shortness of breath R06.02   7. Encounter for therapeutic drug monitoring Z51.81        Plan:     Patient Instructions     ICD-10-CM   1. IPF (idiopathic pulmonary fibrosis) ([place]) J84.112   2. Pulmonary emphysema, unspecified emphysema type ([place]) J43.9   3. Anxiety F41.9   4. Physical  deconditioning R53.81   5. Drug-induced weight loss R63.4    T50.905A   6. Shortness of breath R06.02   7. Encounter for therapeutic drug monitoring Z51.81    IPF (idiopathic pulmonary fibrosis) ([place])  - clinically stable since early part of the year - continue ofev though it might be causing weight loss - seems you are tolerating it fine otherwise - ok not to use night o2 due to choking feeling  Pulmonary emphysema, unspecified emphysema type ([place])  -start sample incruse schedule and see if it helps - proair as neded  Anxiety - d/w PCP Nafziger, Tommi Rumps, NP and see if you can be referred to counseling or  psychiatris  Physical deconditioning Shortness of breath  - due to all of above  -refer pulmonary rehab  Drug-induced weight loss due to ofev - check LFT 08/12/2018  - monitor   Encounter for therapeutic drug monitoring with ofev - check LFT 08/12/2018    > 50% of this > 40 min visit spent in face to face counseling or/and coordination of care - by this undersigned MD - Dr Brand Males. This includes one or more of the following documented above: discussion of test results, diagnostic or treatment recommendations, prognosis, risks and benefits of management options, instructions, education, compliance or risk-factor reduction   SIGNATURE    Dr. Brand Males, M.D., F.C.C.P,  Pulmonary and Critical Care Medicine Staff Physician, [place] Director - Interstitial Lung Disease  Program  Pulmonary [facility] [place], [place], [address]  Pager: [phone], If no answer or between  15:00h - 7:00h: call [phone] Telephone: [phone]  12:24 PM 08/12/2018

## 2018-08-12 NOTE — Patient Instructions (Addendum)
ICD-10-CM   1. IPF (idiopathic pulmonary fibrosis) ([place]) J84.112   2. Pulmonary emphysema, unspecified emphysema type ([place]) J43.9   3. Anxiety F41.9   4. Physical deconditioning R53.81   5. Drug-induced weight loss R63.4    T50.905A   6. Shortness of breath R06.02   7. Encounter for therapeutic drug monitoring Z51.81    IPF (idiopathic pulmonary fibrosis) ([place])  - clinically stable since early part of the year - continue ofev though it might be causing weight loss - seems you are tolerating it fine otherwise - ok not to use night o2 due to choking feeling - extreme social distancing next 8 weeks due to COVID-19  - cancel family visiting via air from [place]  Pulmonary emphysema, unspecified emphysema type ([place]) -start sample incruse schedule and see if it helps - proair as neded  Anxiety - d/w PCP Nafziger, Tommi Rumps, NP and see if you can be referred to counseling or psychiatris  Physical deconditioning Shortness of breath  - due to all of above and out of proportion to limitation from ILD  -refer pulmonary rehab  Drug-induced weight loss due to ofev - check LFT 08/12/2018  - monitor   Encounter for therapeutic drug monitoring with ofev - check LFT 08/12/2018   Followup ILD clinic if 4-5 weeks; simple walk at followup

## 2018-08-13 ENCOUNTER — Telehealth (HOSPITAL_COMMUNITY): Payer: Self-pay

## 2018-08-13 ENCOUNTER — Telehealth: Payer: Self-pay | Admitting: Adult Health

## 2018-08-13 ENCOUNTER — Other Ambulatory Visit: Payer: Self-pay | Admitting: *Deleted

## 2018-08-13 DIAGNOSIS — J84112 Idiopathic pulmonary fibrosis: Secondary | ICD-10-CM

## 2018-08-13 NOTE — Telephone Encounter (Signed)
Spoke to Mrs. Morella and informd her to give the pt a whole tab.  Nothing further needed at this time.

## 2018-08-13 NOTE — Telephone Encounter (Signed)
Copied from [place] [id number]. Topic: Quick Communication - See Telephone Encounter >> Aug 13, 2018 10:05 AM Bea Graff, NT wrote: CRM for notification. See Telephone encounter for: 08/13/18. Pts wife is wanting to know if her husband can take a whole traZODone (DESYREL) 50 MG tablet at night to help with sleep as the 0.5 tablet is not helping. Pleae advise.

## 2018-08-13 NOTE — Progress Notes (Signed)
Forgot to place order at [facility] yesterday, 08/12/2018. Per Aaron Edelman, okay to place order for pt's O2 to be d/c'd and he would sign it.  Order has been placed. Nothing further needed.

## 2018-08-13 NOTE — Telephone Encounter (Signed)
[facility] for full tab

## 2018-08-13 NOTE — Telephone Encounter (Signed)
Referral received from [facility] for Pulmonary Rehab with diagnosis of IPF. Clinical review of pt follow up appt on 3/5 Pulmonary office note. Pt appropriate for scheduling for Pulmonary rehab.  Will forward to support staff for scheduling and verification of insurance eligibility/benefits with pt consent.   Joycelyn Man, RN, BSN Cardiac and Pulmonary Rehab Nurse

## 2018-08-16 ENCOUNTER — Other Ambulatory Visit: Payer: Medicare Other

## 2018-08-16 ENCOUNTER — Telehealth: Payer: Self-pay | Admitting: Internal Medicine

## 2018-08-16 NOTE — Telephone Encounter (Signed)
Called patient x2 and the phone kept ringing busy. Will call back.

## 2018-08-17 ENCOUNTER — Telehealth (HOSPITAL_COMMUNITY): Payer: Self-pay

## 2018-08-17 MED ORDER — UMECLIDINIUM BROMIDE 62.5 MCG/INH IN AEPB: 1.0000 | INHALATION_SPRAY | Freq: Every day | RESPIRATORY_TRACT | 0 refills | Status: DC

## 2018-08-17 NOTE — Telephone Encounter (Signed)
Pt insurance is active and benefits verified through [place] 0, DED $198/$198 met, out of pocket 0/0 met, co-insurance 0. no pre-authorization required, REF# [id number] 2ndary insurance is active and benefits verified through [place]. Co-pay 0, DED 0/0 met, out of pocket 0/0 met, co-insurance 0. No pre-authorization required, REF# [id number]  Will contact patient to see if he is interested in the Pulmonary [place]. Support Rep II

## 2018-08-17 NOTE — Telephone Encounter (Signed)
[name] is requesting a Rx for Incruse. Pharm [facility] [place], [place] [address] is [phone].

## 2018-08-17 NOTE — Telephone Encounter (Signed)
IPF (idiopathic pulmonary fibrosis) (HCC)  - clinically stable since early part of the year - continue ofev though it might be causing weight loss - seems you are tolerating it fine otherwise - ok not to use night o2 due to choking feeling - extreme social distancing next 8 weeks due to COVID-19             - cancel family visiting via air from [place]  Pulmonary emphysema, unspecified emphysema type ([place]) -start sample incruse schedule and see if it helps - proair as neded ____________________________________________________________  Hulen Skains and spoke with patients wife, she stated that the incruse inhaler worked and they need a prescription. Prescription sent to verified pharmacy. Nothing further needed.

## 2018-08-17 NOTE — Telephone Encounter (Signed)
Attempted to call patient in regards to Pulmonary Rehab - LM on VM Gloria W. Support Rep II

## 2018-08-18 ENCOUNTER — Inpatient Hospital Stay: Payer: Medicare Other | Attending: Internal Medicine | Admitting: Internal Medicine

## 2018-08-18 ENCOUNTER — Encounter: Payer: Self-pay | Admitting: Internal Medicine

## 2018-08-18 ENCOUNTER — Other Ambulatory Visit: Payer: Self-pay

## 2018-08-18 VITALS — BP 132/75 | HR 86 | Temp 98.0°F | Resp 18 | Ht 66.0 in | Wt 168.7 lb

## 2018-08-18 DIAGNOSIS — J84112 Idiopathic pulmonary fibrosis: Secondary | ICD-10-CM | POA: Diagnosis not present

## 2018-08-18 DIAGNOSIS — Z85828 Personal history of other malignant neoplasm of skin: Secondary | ICD-10-CM | POA: Diagnosis not present

## 2018-08-18 DIAGNOSIS — M129 Arthropathy, unspecified: Secondary | ICD-10-CM | POA: Diagnosis not present

## 2018-08-18 DIAGNOSIS — Z923 Personal history of irradiation: Secondary | ICD-10-CM | POA: Insufficient documentation

## 2018-08-18 DIAGNOSIS — R1310 Dysphagia, unspecified: Secondary | ICD-10-CM

## 2018-08-18 DIAGNOSIS — C3482 Malignant neoplasm of overlapping sites of left bronchus and lung: Secondary | ICD-10-CM | POA: Insufficient documentation

## 2018-08-18 DIAGNOSIS — Z9221 Personal history of antineoplastic chemotherapy: Secondary | ICD-10-CM | POA: Insufficient documentation

## 2018-08-18 DIAGNOSIS — C3492 Malignant neoplasm of unspecified part of left bronchus or lung: Secondary | ICD-10-CM

## 2018-08-18 DIAGNOSIS — R0602 Shortness of breath: Secondary | ICD-10-CM | POA: Diagnosis not present

## 2018-08-18 DIAGNOSIS — Z79899 Other long term (current) drug therapy: Secondary | ICD-10-CM | POA: Diagnosis not present

## 2018-08-18 DIAGNOSIS — J701 Chronic and other pulmonary manifestations due to radiation: Secondary | ICD-10-CM

## 2018-08-18 NOTE — Telephone Encounter (Signed)
Pt wife called and sch pt for pulmonary rehab orientation on 09/27/2018 @ 9:30am with exc at 10:30am.. Tedra Senegal. Support Rep II

## 2018-08-18 NOTE — Progress Notes (Signed)
[facility] [facility] Telephone:([phone]) [phone]   Fax:([phone]) [phone]  OFFICE PROGRESS NOTE  [name], NP [place] [address]  DIAGNOSIS: Stage IIIA (T3, N2, M0) non-small cell lung cancer, poorly differentiated squamous cell carcinoma diagnosed in February 2017 with PDL 1 expression of 35%, presented with large left lower lobe lung mass in addition to left upper lobe lung nodule and mediastinal lymphadenopathy.  PRIOR THERAPY: Course of concurrent chemoradiation with weekly carboplatin for AUC of 2 and paclitaxel 45 MG/M2. Status post 7 cycles, last dose was given 11/19/2015 with partial response.  CURRENT THERAPY: Observation.  INTERVAL HISTORY: Jason Summers 85 y.o. male returns to the clinic today for follow-up visit accompanied by his wife.  The patient continues to complain of shortness of breath at baseline increased with exertion.  He was diagnosed with idiopathic pulmonary fibrosis by Dr. Chase Caller and currently undergoing treatment with nintedanib.  The patient also complaining of progressive weight loss which could be secondary to his treatment. He denied having any chest pain or hemoptysis.  He denied having any nausea, vomiting, diarrhea or constipation.  He is here today for evaluation.  MEDICAL HISTORY: Past Medical History:  Diagnosis Date  . Arthritis   . Cancer ([place])    basal cell on right temple  . Hemorrhoids   . Lateral epicondylitis (tennis elbow)   . Non-small cell lung cancer (NSCLC) ([place]) dx'f 07/30/15  . Odynophagia 11/06/2015  . Spondylosis of cervical joint     ALLERGIES:  is allergic to gentamicin and loteprednol-tobramycin.  MEDICATIONS:  Current Outpatient Medications  Medication Sig Dispense Refill  . albuterol (PROVENTIL HFA;VENTOLIN HFA) 108 (90 Base) MCG/ACT inhaler Inhale 2 puffs into the lungs every 6 (six) hours as needed for wheezing or shortness of breath. 1 Inhaler 6  . esomeprazole (NEXIUM) 40 MG capsule  Take 1 capsule (40 mg total) by mouth daily at 12 noon. 90 capsule 3  . glucosamine-chondroitin 500-400 MG tablet Take 1 tablet by mouth daily. Reported on 09/11/2015    . OFEV 150 MG CAPS Take 1 tablet by mouth 2 (two) times daily.    . tamsulosin (FLOMAX) 0.4 MG CAPS capsule Take 1 capsule (0.4 mg total) by mouth daily. (Patient not taking: Reported on 08/12/2018) 30 capsule 3  . Tiotropium Bromide Monohydrate (SPIRIVA RESPIMAT) 2.5 MCG/ACT AERS Inhale 2 puffs into the lungs daily. (Patient not taking: Reported on 07/29/2018) 3 Inhaler 3  . traZODone (DESYREL) 50 MG tablet Take 0.5 tablets (25 mg total) by mouth at bedtime as needed for sleep. (Patient taking differently: Take 50 mg by mouth at bedtime as needed for sleep. ) 90 tablet 1  . umeclidinium bromide (INCRUSE ELLIPTA) 62.5 MCG/INH AEPB Inhale 1 puff into the lungs daily. 1 each 0  . vitamin B-12 (CYANOCOBALAMIN) 1000 MCG tablet Take 1,500 mcg by mouth daily.     No current facility-administered medications for this visit.     SURGICAL HISTORY:  Past Surgical History:  Procedure Laterality Date  . COLONOSCOPY  2003  . KNEE ARTHROSCOPY  2006   right    REVIEW OF SYSTEMS:  A comprehensive review of systems was negative except for: Constitutional: positive for fatigue and weight loss Respiratory: positive for cough and dyspnea on exertion   PHYSICAL EXAMINATION: General appearance: alert, cooperative and no distress Head: Normocephalic, without obvious abnormality, atraumatic Neck: no adenopathy, no JVD, supple, symmetrical, trachea midline and thyroid not enlarged, symmetric, no tenderness/mass/nodules Lymph nodes: Cervical, supraclavicular,  and axillary nodes normal. Resp: rales bilaterally Back: symmetric, no curvature. ROM normal. No CVA tenderness. Cardio: regular rate and rhythm, S1, S2 normal, no murmur, click, rub or gallop GI: soft, non-tender; bowel sounds normal; no masses,  no organomegaly Extremities: extremities  normal, atraumatic, no cyanosis or edema  ECOG PERFORMANCE STATUS: 1 - Symptomatic but completely ambulatory  Blood pressure 132/75, pulse 86, temperature 98 F (36.7 C), temperature source Oral, resp. rate 18, height _0  (1.676 m), weight 168 lb 11.2 oz (76.5 kg), SpO2 94 %.  LABORATORY DATA: Lab Results  Component Value Date   WBC 8.0 06/15/2018   HGB 13.1 06/15/2018   HCT 39.1 06/15/2018   MCV 81.5 06/15/2018   PLT 245.0 06/15/2018      Chemistry      Component Value Date/Time   NA 133 (L) 06/15/2018 1224   NA 139 01/27/2017 0826   K 4.3 06/15/2018 1224   K 4.4 01/27/2017 0826   CL 100 06/15/2018 1224   CO2 26 06/15/2018 1224   CO2 25 01/27/2017 0826   BUN 20 06/15/2018 1224   BUN 15.5 01/27/2017 0826   CREATININE 0.89 06/15/2018 1224   CREATININE 0.90 02/12/2018 0841   CREATININE 0.9 01/27/2017 0826      Component Value Date/Time   CALCIUM 8.9 06/15/2018 1224   CALCIUM 9.1 01/27/2017 0826   ALKPHOS 88 08/12/2018 1237   ALKPHOS 89 01/27/2017 0826   AST 17 08/12/2018 1237   AST 14 (L) 02/12/2018 0841   AST 16 01/27/2017 0826   ALT 26 08/12/2018 1237   ALT 8 02/12/2018 0841   ALT 13 01/27/2017 0826   BILITOT 0.4 08/12/2018 1237   BILITOT 0.5 02/12/2018 0841   BILITOT 0.52 01/27/2017 0826       RADIOGRAPHIC STUDIES: No results found. ASSESSMENT AND PLAN:  This is a very pleasant 85 years old white male with stage IIIA non-small cell lung cancer, squamous cell carcinoma. The patient completed a course of concurrent chemoradiation with weekly carboplatin and paclitaxel for 7 cycles and tolerated his treatment well except for radiation induced pneumonitis as well as odynophagia and dysphagia secondary to radiation induced esophagitis.  The patient has been in observation for over 2 years and he has been doing fine except for the progressive shortness of breath secondary to idiopathic pulmonary fibrosis. He is currently undergoing treatment for the pulmonary  fibrosis under the care of Dr. Chase Caller. His most recent imaging study in January 2020 showed no concerning findings for disease progression of his lung cancer. I recommended for the patient to continue his routine follow-up visit and evaluation as well as scans by Dr. Chase Caller at this point. I will see the patient on as-needed basis if he has any recurrence of lung cancer. He was advised to call if he has any concerning symptoms. The patient voices understanding of current disease status and treatment options and is in agreement with the current care plan.  All questions were answered. The patient knows to call the clinic with any problems, questions or concerns. We can certainly see the patient much sooner if necessary.  Disclaimer: This note was dictated with voice recognition software. Similar sounding words can inadvertently be transcribed and may not be corrected upon review.

## 2018-08-26 ENCOUNTER — Telehealth: Payer: Self-pay | Admitting: Adult Health

## 2018-08-26 DIAGNOSIS — F419 Anxiety disorder, unspecified: Secondary | ICD-10-CM

## 2018-08-26 NOTE — Telephone Encounter (Signed)
OK to increase Trazodone to 100 mg at night  X 30 days

## 2018-08-26 NOTE — Telephone Encounter (Signed)
Copied from [place] [id number]. Topic: General - Other >> Aug 26, 2018  2:17 PM Oneta Rack wrote:  Caller name: Lamora,Laura Relation to pt: spouse  Call back number: [phone] Pharmacy: [facility], [place] [address] [phone] (Phone) [phone] (Fax)  Reason for call:  Spouse states patient is not sleeping, patient is taking 1 tablet of traZODone (DESYREL) 50 MG tablet instead of 1/2 and the increase is not working. Spouse seeking clinical advice or alternate, please advise

## 2018-08-27 MED ORDER — TRAZODONE HCL 50 MG PO TABS: ORAL_TABLET | ORAL | 0 refills | Status: DC

## 2018-08-27 NOTE — Telephone Encounter (Signed)
Rx sent and wife is aware

## 2018-09-06 ENCOUNTER — Telehealth: Payer: Self-pay | Admitting: Pulmonary Disease

## 2018-09-06 NOTE — Telephone Encounter (Signed)
Patient tele-visit  got moved up to tomorrow so bm could address the sob tomorrow nothing futher needed at this time.

## 2018-09-06 NOTE — Progress Notes (Addendum)
Virtual Visit via Telephone Note  I connected with Jason Summers on 09/07/18 at 11:00 AM EDT by telephone and verified that I am speaking with the correct person using two identifiers.   I discussed the limitations, risks, security and privacy concerns of performing an evaluation and management service by telephone and the availability of in person appointments. I also discussed with the patient that there may be a patient responsible charge related to this service. The patient expressed understanding and agreed to proceed.   History of Present Illness: 85 year old male former smoker followed in our office for idiopathic pulmonary fibrosis  PMH: GERD, lung cancer, radiation fibrosis of the lung, esophageal strictures, chest pain, headaches Smoker/ Smoking History: Former smoker Maintenance: Ofev Pt of: Dr. Chase Caller  Patient consented to consult via telephone: Yes  People present and their role in pt care: Pt and Pt Spouse   Chief complaint: IPF   85 year old male followed in our office for IPF.  Patient with progressive ILD seen on January/2020 CT chest.  Patient recently started on Ofev at that time.  Patient is continued to have progressive shortness of breath.  Patient last seen by me in February/2020 and was treated as an IPF flare with doxycycline as well as prednisone.  Patient reported that this did help his breathing but caused him to have insomnia.  Patient followed up with Dr. Chase Caller and was deemed stable at that time.  Was recommended that he follow-up with primary care regarding his anxiety.  Patient has done so and his trazodone has actually been increased to 100 mg daily.  Patient still having aspects of anxiety which may be due to his rescue inhaler use every 6 hours.  Patient continues to use oxygen at night although it does affect his sleep.  I as well as Dr. Chase Caller have discussed with the patient that it is okay for him to stop using oxygen at night.  Patient has been  maintained on Incruse Ellipta and reports adherence.  He is having slight occasional wheezing but no fevers chills or body aches.  Wife and patient are both concerned as they report that his breathing has not ever stabilized or really improved with the interventions that we have.  They are wondering what other medications can be added.  Observations/Objective:  09/07/2018 - Spo2 - 94 08/28/2018 - HR - 94  06/22/2018-CT chest with contrast- spectrum of findings suggestive of basilar predominant fibrotic interstitial lung disease with mild honeycombing asymmetrically involving the right lung with significant progression since February/2019 in September/2019 chest CT.  Findings consistent with UIP.  06/22/2018-echocardiogram- LV ejection fraction 55 to 32%, grade 1 diastolic dysfunction, PAP pressure 45  ONO 06/17/2018 - < 88% for 37 min only.  No results found for: NITRICOXIDE  Pulmonary Functions Testing Results:  No results found for: FEV1, FVC, FEV1FVC, TLC, DLCO   Assessment and Plan:  Pulmonary emphysema (HCC) Plan: Continue Incruse Ellipta  IPF (idiopathic pulmonary fibrosis) (HCC) Assessment: UIP on January/2020 CT chest Currently managed on 0FEV Continues to have aspects of anxiety which is managed by primary care Still managed on nighttime oxygen Struggling with physical activity Pending referral to pulmonary rehab  Plan: We will treat as a potential IPF flare with myriad of symptoms of shortness of breath Prednisone taper today, lessen dose due to the insomnia caused by last prednisone taper Continue 0FEV Okay to stop nighttime O2 2-week telephonic out reach with Wyn Quaker FNP  Patient still needs pending pulmonary function test Continue forward  with pulmonary rehab referral when covid19 restrictions are lifted  Anxiety Plan:  follow-up with primary care in regards to your anxiety   Follow Up Instructions:  Return in about 2 weeks (around 09/21/2018), or if  symptoms worsen or fail to improve, for Follow up with Wyn Quaker FNP-C, Follow up with Dr. Purnell Shoemaker.    I discussed the assessment and treatment plan with the patient. The patient was provided an opportunity to ask questions and all were answered. The patient agreed with the plan and demonstrated an understanding of the instructions.   The patient was advised to call back or seek an in-person evaluation if the symptoms worsen or if the condition fails to improve as anticipated.  I provided 22 minutes of non-face-to-face time during this encounter. This includes time reaching out to Dr Purnell Shoemaker regarding the patient. See telephone note.   Lauraine Rinne, NP

## 2018-09-07 ENCOUNTER — Encounter: Payer: Self-pay | Admitting: Pulmonary Disease

## 2018-09-07 ENCOUNTER — Ambulatory Visit (INDEPENDENT_AMBULATORY_CARE_PROVIDER_SITE_OTHER): Payer: Medicare Other | Admitting: Pulmonary Disease

## 2018-09-07 ENCOUNTER — Other Ambulatory Visit: Payer: Self-pay

## 2018-09-07 ENCOUNTER — Telehealth: Payer: Self-pay | Admitting: Pulmonary Disease

## 2018-09-07 DIAGNOSIS — F419 Anxiety disorder, unspecified: Secondary | ICD-10-CM | POA: Diagnosis not present

## 2018-09-07 DIAGNOSIS — R0602 Shortness of breath: Secondary | ICD-10-CM | POA: Diagnosis not present

## 2018-09-07 DIAGNOSIS — J439 Emphysema, unspecified: Secondary | ICD-10-CM | POA: Diagnosis not present

## 2018-09-07 DIAGNOSIS — J84112 Idiopathic pulmonary fibrosis: Secondary | ICD-10-CM | POA: Diagnosis not present

## 2018-09-07 MED ORDER — PREDNISONE 10 MG PO TABS: ORAL_TABLET | ORAL | 0 refills | Status: DC

## 2018-09-07 NOTE — Assessment & Plan Note (Addendum)
Assessment: UIP on January/2020 CT chest Currently managed on 0FEV Continues to have aspects of anxiety which is managed by primary care Still managed on nighttime oxygen Struggling with physical activity Pending referral to pulmonary rehab  Plan: We will treat as a potential IPF flare with myriad of symptoms of shortness of breath Prednisone taper today, lessen dose due to the insomnia caused by last prednisone taper Continue 0FEV Okay to stop nighttime O2 2-week telephonic out reach with Jason Quaker FNP  Patient still needs pending pulmonary function test Continue forward with pulmonary rehab referral when covid19 restrictions are lifted

## 2018-09-07 NOTE — Telephone Encounter (Signed)
09/07/2018 1107  Dr. Chase Caller I contacted the patient today to do a telephone visit.  Patient is still having aspects of shortness of breath.  After discussing with him we agreed to proceed forward with a lower dose 10-day taper of prednisone starting at 20 mg.  We did a lower dose because the patient had insomnia when started at 40 mg.  Patient reports adherence to 0FEV as well as Incruse Ellipta.  We do not have pulmonary function testing on the patient.  Patient has pending pulmonary rehab referral which is on hold due to COVID-19 restrictions.  Do you have any other ideas for how we can better manage this patient's aspects of shortness of breath?  I am scheduled to do a 2-week telephone follow-up with the patient in [place] FNP

## 2018-09-07 NOTE — Patient Instructions (Addendum)
Prednisone 13m tablet  >>>Take 2 tablets (20 mg total) daily for the next 5 days, then 163mfor 5 days  >>> Take with food in the morning  Continue Incruse Ellipta  >>> Take 1 puff daily in the morning right when you wake up >>>Rinse your mouth out after use >>>This is a daily maintenance inhaler, NOT a rescue inhaler >>>Contact our office if you are having difficulties affording or obtaining this medication >>>It is important for you to be able to take this daily and not miss any doses  Continue OFEV as prescribed   Okay to stop overnight oxygen   Continue referral to Pulmonary Rehab   Needs to follow up with PCP for chronic management of anxiety   Return in about 2 weeks (around 09/21/2018), or if symptoms worsen or fail to improve, for Follow up with BrWyn QuakerNP-C, Follow up with Dr. RaPurnell Shoemaker     Coronavirus (COVID-19) Are you at risk?  Are you at risk for the Coronavirus (COVID-19)?  To be considered HIGH RISK for Coronavirus (COVID-19), you have to meet the following criteria:  . Traveled to [place] have fever, cough, and shortness of breath within the last 2 weeks of travel OR . Been in close contact with a person diagnosed with COVID-19 within the last 2 weeks and have fever, cough, and shortness of breath . IF YOU DO NOT MEET THESE CRITERIA, YOU ARE CONSIDERED LOW RISK FOR COVID-19.  What to do if you are HIGH RISK for COVID-19?  . Marland Kitchenf you are having a medical emergency, call 911. . Seek medical care right away. Before you go to a doctor's office, urgent care or emergency department, call ahead and tell them about your recent travel, contact with someone diagnosed with COVID-19, and your symptoms. You should receive instructions from your physician's office regarding next steps of care.  . When you arrive at healthcare provider, tell the healthcare  staff immediately you have returned from visiting [place] the last two weeks or you have been in close contact with a person diagnosed with COVID-19 in the last 2 weeks.   . Tell the health care staff about your symptoms: fever, cough and shortness of breath. . After you have been seen by a medical provider, you will be either: o Tested for (COVID-19) and discharged home on quarantine except to seek medical care if symptoms worsen, and asked to  - Stay home and avoid contact with others until you get your results (4-5 days)  - Avoid travel on public transportation if possible (such as bus, train, or airplane) or o Sent to the Emergency Department by EMS for evaluation, COVID-19 testing, and possible admission depending on your condition and test results.  What to do if you are LOW RISK for COVID-19?  Reduce your risk of any infection by using the same precautions used for avoiding the common cold or flu:  . Marland Kitchenash your hands often with soap and warm water for at least 20 seconds.  If soap and water are not readily available, use an alcohol-based hand sanitizer with at least 60% alcohol.  . If coughing or sneezing, cover your mouth and nose by coughing or sneezing into the elbow areas of your shirt or coat,  into a tissue or into your sleeve (not your hands). . Avoid shaking hands with others and consider head nods or verbal greetings only. . Avoid touching your eyes, nose, or mouth with unwashed hands.  . Avoid close contact with people who are sick. . Avoid places or events with large numbers of people in one location, like concerts or sporting events. . Carefully consider travel plans you have or are making. . If you are planning any travel outside or inside the [place], visit the CDC's Travelers' Health webpage for the latest health notices. . If you have some symptoms but not all  symptoms, continue to monitor at home and seek medical attention if your symptoms worsen. . If you are having a medical emergency, call 911.   [place] / e-Visit: [email or web address]         [facility]: [facility]: [phone]                   [facility]: [phone]           It is flu season:   >>> Best ways to protect herself from the flu: Receive the yearly flu vaccine, practice good hand hygiene washing with soap and also using hand sanitizer when available, eat a nutritious meals, get adequate rest, hydrate appropriately   Please contact the office if your symptoms worsen or you have concerns that you are not improving.   Thank you for choosing [facility] for your healthcare, and for allowing [place] to partner with you on your healthcare journey. I am thankful to be able to provide care to you today.   Wyn Quaker FNP-C

## 2018-09-07 NOTE — Assessment & Plan Note (Signed)
Plan: Continue Incruse Ellipta

## 2018-09-07 NOTE — Assessment & Plan Note (Signed)
Plan:  follow-up with primary care in regards to your anxiety

## 2018-09-09 ENCOUNTER — Ambulatory Visit: Payer: Medicare Other | Admitting: Pulmonary Disease

## 2018-09-09 ENCOUNTER — Ambulatory Visit: Payer: Medicare Other | Admitting: Internal Medicine

## 2018-09-10 NOTE — Telephone Encounter (Signed)
In absence of pft - reasess walking destats  And if drops he can use portable o2 (note he di dnot tolerate night o2). Other options are a) out of pocket innogen despite o2 levels; b) morphine for dyspnea syrup prn

## 2018-09-10 NOTE — Telephone Encounter (Signed)
Usually do morphine syrup 6m prn once per day first few weeks and then reasess

## 2018-09-10 NOTE — Telephone Encounter (Signed)
We will further assess at next outreach.    If we do proceed forward with morphine for dyspnea:  >>>What amounts do you typically prescribe?   I have very rarely have had to start that.  Thanks,   Aaron Edelman

## 2018-09-10 NOTE — Telephone Encounter (Signed)
Got it. Thanks.   Jason Summers

## 2018-09-13 ENCOUNTER — Telehealth: Payer: Self-pay | Admitting: Pulmonary Disease

## 2018-09-13 NOTE — Telephone Encounter (Signed)
Spouse aware that they are to use one or the other but not both. Nothing further needed.

## 2018-09-13 NOTE — Telephone Encounter (Signed)
FYI: Returned call to patient's spouse Went over plan per Brian's visit 03/31. Pt should be using Incruse. She states he had been but didn't feel much relief, so he used Spiriva on yesterday. Advised to stick with the plan until televisit on 4/14. He was given Prednisone taper 20 mg x 5 days then 10 mg for 5 days. They were given #20 so she wanted to know if he should take remainder of tabs. Again reminded to stick with plan per 3/31 visit. She verbalized understanding Also he had been scheduled for Pulm Rehab orientation in 2 weeks.

## 2018-09-13 NOTE — Telephone Encounter (Signed)
Okay.  I am fine with the patient taking Spiriva Respimat 2.5 or Incruse Ellipta.  The patient had previously reported to Dr. Chase Caller that he preferred Incruse Ellipta.  Wyn Quaker, FNP

## 2018-09-14 ENCOUNTER — Telehealth (HOSPITAL_COMMUNITY): Payer: Self-pay

## 2018-09-20 NOTE — Progress Notes (Signed)
Virtual Visit via Telephone Note  I connected with Jason Summers on 09/21/18 at  9:30 AM EDT by telephone and verified that I am speaking with the correct person using two identifiers.   I discussed the limitations, risks, security and privacy concerns of performing an evaluation and management service by telephone and the availability of in person appointments. I also discussed with the patient that there may be a patient responsible charge related to this service. The patient expressed understanding and agreed to proceed.   History of Present Illness: 85 year old male former smoker followed in our office for idiopathic pulmonary fibrosis  PMH: GERD, lung cancer, radiation fibrosis of the lung, esophageal strictures, chest pain, headaches Smoker/ Smoking History: Former smoker Maintenance: Ofev Pt of: Dr. Chase Caller  Patient consented to consult via telephone: Yes People present and their role in pt care: Pt and Spouse   Chief complaint: IPF   85 year old male former smoker followed in our office for IPF.  Patient has been managed on 0FEV.  Patient was treated telephonically 2 weeks ago with a prednisone taper which she feels like has helped some with his shortness of breath.  After last tele-visit I discussed the patient's case with Dr. Chase Caller.  Dr. Chase Caller felt the patient need to be evaluated in person with a walk if symptoms were not improved on prednisone.  He also suggested that we could trial the patient on as needed morphine for management of his dyspnea.  I discussed both of these options with the patient as well as with his spouse today.  They would not like to proceed forward with either of them.  They are willing to come in in a couple of weeks to our office to do a walk but they feel that the prednisone has helped some and they would actually like to do another trial of a prednisone taper.  Their largest concern is the COVID-19 pandemic and staying safe.  Patient reports has  been using his rescue inhaler 2-3 times a day.  Patient has not been using his Incruse Ellipta as prescribed.  We will discuss this today.  Observations/Objective:  09/21/18 - Sp02 - 92  09/21/18 - HR - 84  06/22/2018-CT chest with contrast- spectrum of findings suggestive of basilar predominant fibrotic interstitial lung disease with mild honeycombing asymmetrically involving the right lung with significant progression since February/2019 in September/2019 chest CT.  Findings consistent with UIP.  06/22/2018-echocardiogram- LV ejection fraction 55 to 42%, grade 1 diastolic dysfunction, PAP pressure 45  ONO 06/17/2018 - < 88% for 37 min only.  No results found for: NITRICOXIDE  Assessment and Plan:  Pulmonary emphysema (HCC) Assessment: Patient reports he is just been using his rescue inhaler and has not been using his Incruse or Spiriva  Plan: Continue Incruse Ellipta  IPF (idiopathic pulmonary fibrosis) ([place]) Assessment: UIP on January/2020 CT chest Currently managed on 74fv Has occasional bouts of anxiety which is currently managed by primary care Patient has stop nighttime not oxygen use Still struggling with dyspnea and physical activity Patient reports that his shortness of breath has improved since taking the prednisone taper Pending referral to pulmonary rehab, on hold due to COVID-19  Plan: Prednisone taper today Continue Ofev 4-week in office visit with a walk to assess oxygen saturations Continue forward with pulmonary rehab referral when COVID-19 restrictions are lifted  We discussed both options at Dr. RChase Callerhad suggested after last tele-visit.  The patient and spouse both declined morphine at this time.  They are  willing to come in for an office visit but they would like for that to be in 4 weeks.  I can respect this.  We will do another trial of prednisone in the short-term due to COVID-19 restrictions and the fear of the patient venturing out to the  public.    Follow Up Instructions:  Return in about 4 weeks (around 10/19/2018), or if symptoms worsen or fail to improve, for Follow up with Wyn Quaker FNP-C.    I discussed the assessment and treatment plan with the patient. The patient was provided an opportunity to ask questions and all were answered. The patient agreed with the plan and demonstrated an understanding of the instructions.   The patient was advised to call back or seek an in-person evaluation if the symptoms worsen or if the condition fails to improve as anticipated.  I provided 22 minutes of non-face-to-face time during this encounter.   Lauraine Rinne, NP

## 2018-09-21 ENCOUNTER — Ambulatory Visit (INDEPENDENT_AMBULATORY_CARE_PROVIDER_SITE_OTHER): Payer: Medicare Other | Admitting: Pulmonary Disease

## 2018-09-21 ENCOUNTER — Encounter: Payer: Self-pay | Admitting: Pulmonary Disease

## 2018-09-21 ENCOUNTER — Other Ambulatory Visit: Payer: Self-pay

## 2018-09-21 DIAGNOSIS — R0602 Shortness of breath: Secondary | ICD-10-CM

## 2018-09-21 DIAGNOSIS — J84112 Idiopathic pulmonary fibrosis: Secondary | ICD-10-CM

## 2018-09-21 DIAGNOSIS — J439 Emphysema, unspecified: Secondary | ICD-10-CM

## 2018-09-21 MED ORDER — PREDNISONE 10 MG PO TABS: ORAL_TABLET | ORAL | 0 refills | Status: DC

## 2018-09-21 MED ORDER — UMECLIDINIUM BROMIDE 62.5 MCG/INH IN AEPB: 1.0000 | INHALATION_SPRAY | Freq: Every day | RESPIRATORY_TRACT | 6 refills | Status: DC

## 2018-09-21 NOTE — Patient Instructions (Addendum)
Prednisone 22m tablet  >>>Take 2 tablets (20 mg total) daily for the next 5 days, then 130mfor 5 days  >>> Take with food in the morning  Continue Incruse Ellipta  >>> Take 1 puff daily in the morning right when you wake up >>>Rinse your mouth out after use >>>This is a daily maintenance inhaler, NOT a rescue inhaler >>>Contact our office if you are having difficulties affording or obtaining this medication >>>It is important for you to be able to take this daily and not miss any doses  Only use your albuterol as a rescue medication to be used if you can't catch your breath by resting or doing a relaxed purse lip breathing pattern.  - The less you use it, the better it will work when you need it. - Ok to use up to 2 puffs  every 4 hours if you must but call for immediate appointment if use goes up over your usual need - Don't leave home without it !!  (think of it like the spare tire for your car)    Continue OFEV as prescribed   Okay to stop overnight oxygen   Continue referral to Pulmonary Rehab   Continue follow up with PCP for chronic management of anxiety     Return in about 4 weeks (around 10/19/2018), or if symptoms worsen or fail to improve, for Follow up with BrWyn QuakerNP-C.   Coronavirus (COVID-19) Are you at risk?  Are you at risk for the Coronavirus (COVID-19)?  To be considered HIGH RISK for Coronavirus (COVID-19), you have to meet the following criteria:  . Traveled to [place] have fever, cough, and shortness of breath within the last 2 weeks of travel OR . Been in close contact with a person diagnosed with COVID-19 within the last 2 weeks and have fever, cough, and shortness of breath . IF YOU DO NOT MEET THESE CRITERIA, YOU ARE CONSIDERED LOW RISK FOR COVID-19.  What to do if you are HIGH RISK for COVID-19?  . Marland Kitchenf you are having a medical  emergency, call 911. . Seek medical care right away. Before you go to a doctor's office, urgent care or emergency department, call ahead and tell them about your recent travel, contact with someone diagnosed with COVID-19, and your symptoms. You should receive instructions from your physician's office regarding next steps of care.  . When you arrive at healthcare provider, tell the healthcare staff immediately you have returned from visiting [place] traveled [place] the last two weeks or you have been in close contact with a person diagnosed with COVID-19 in the last 2 weeks.   . Tell the health care staff about your symptoms: fever, cough and shortness of breath. . After you have been seen by a medical provider, you will be either: o Tested for (COVID-19) and discharged home on quarantine except to seek medical care if symptoms worsen, and asked to  - Stay home and avoid contact with others until you get your results (4-5 days)  - Avoid travel on public transportation if possible (such as bus, train, or airplane) or o Sent to the Emergency Department by EMS for evaluation, COVID-19 testing, and possible admission depending on your condition and test results.  What to do if  you are LOW RISK for COVID-19?  Reduce your risk of any infection by using the same precautions used for avoiding the common cold or flu:  Marland Kitchen Wash your hands often with soap and warm water for at least 20 seconds.  If soap and water are not readily available, use an alcohol-based hand sanitizer with at least 60% alcohol.  . If coughing or sneezing, cover your mouth and nose by coughing or sneezing into the elbow areas of your shirt or coat, into a tissue or into your sleeve (not your hands). . Avoid shaking hands with others and consider head nods or verbal greetings only. . Avoid touching your eyes, nose, or mouth with  unwashed hands.  . Avoid close contact with people who are sick. . Avoid places or events with large numbers of people in one location, like concerts or sporting events. . Carefully consider travel plans you have or are making. . If you are planning any travel outside or inside the [place], visit the CDC's Travelers' Health webpage for the latest health notices. . If you have some symptoms but not all symptoms, continue to monitor at home and seek medical attention if your symptoms worsen. . If you are having a medical emergency, call 911.   [place] / e-Visit: [email or web address]         [facility]: [facility]: [phone]                   [facility]: [phone]           It is flu season:   >>> Best ways to protect herself from the flu: Receive the yearly flu vaccine, practice good hand hygiene washing with soap and also using hand sanitizer when available, eat a nutritious meals, get adequate rest, hydrate appropriately   Please contact the office if your symptoms worsen or you have concerns that you are not improving.   Thank you for choosing [facility] for your healthcare, and for allowing [place] to partner with you on your healthcare journey. I am thankful to be able to provide care to you today.   Wyn Quaker FNP-C

## 2018-09-21 NOTE — Assessment & Plan Note (Signed)
Assessment: Patient reports he is just been using his rescue inhaler and has not been using his Incruse or Spiriva  Plan: Continue Incruse Ellipta

## 2018-09-21 NOTE — Assessment & Plan Note (Signed)
Assessment: UIP on January/2020 CT chest Currently managed on 72fv Has occasional bouts of anxiety which is currently managed by primary care Patient has stop nighttime not oxygen use Still struggling with dyspnea and physical activity Patient reports that his shortness of breath has improved since taking the prednisone taper Pending referral to pulmonary rehab, on hold due to COVID-19  Plan: Prednisone taper today Continue Ofev 4-week in office visit with a walk to assess oxygen saturations Continue forward with pulmonary rehab referral when COVID-19 restrictions are lifted  We discussed both options at Dr. RChase Callerhad suggested after last tele-visit.  The patient and spouse both declined morphine at this time.  They are willing to come in for an office visit but they would like for that to be in 4 weeks.  I can respect this.  We will do another trial of prednisone in the short-term due to COVID-19 restrictions and the fear of the patient venturing out to the public.

## 2018-09-22 ENCOUNTER — Ambulatory Visit (INDEPENDENT_AMBULATORY_CARE_PROVIDER_SITE_OTHER): Payer: Medicare Other | Admitting: Adult Health

## 2018-09-22 ENCOUNTER — Encounter: Payer: Self-pay | Admitting: Adult Health

## 2018-09-22 ENCOUNTER — Other Ambulatory Visit: Payer: Self-pay

## 2018-09-22 DIAGNOSIS — K219 Gastro-esophageal reflux disease without esophagitis: Secondary | ICD-10-CM | POA: Diagnosis not present

## 2018-09-22 DIAGNOSIS — G4700 Insomnia, unspecified: Secondary | ICD-10-CM

## 2018-09-22 DIAGNOSIS — F419 Anxiety disorder, unspecified: Secondary | ICD-10-CM | POA: Diagnosis not present

## 2018-09-22 DIAGNOSIS — R351 Nocturia: Secondary | ICD-10-CM

## 2018-09-22 DIAGNOSIS — J84112 Idiopathic pulmonary fibrosis: Secondary | ICD-10-CM | POA: Diagnosis not present

## 2018-09-22 NOTE — Progress Notes (Signed)
Virtual Visit via Telephone Note  I connected with Jason Summers on 09/22/18 at  8:00 AM EDT by telephone and verified that I am speaking with the correct person using two identifiers.   I discussed the limitations, risks, security and privacy concerns of performing an evaluation and management service by telephone and the availability of in person appointments. I also discussed with the patient that there may be a patient responsible charge related to this service. The patient expressed understanding and agreed to proceed.  Location patient: home Location provider: work or home office Participants present for the call: patient, provider Patient did not have a visit in the prior 7 days to address this/these issue(s).   History of Present Illness: 85 year old male who  has a past medical history of Arthritis, Cancer (Jason Summers), Hemorrhoids, Lateral epicondylitis (tennis elbow), Non-small cell lung cancer (NSCLC) (Jason Summers) (dx'f 07/30/15), Odynophagia (11/06/2015), and Spondylosis of cervical joint.  Being evaluated today for 81-monthfollow-up  Insomnia and anxiety -is doing well with trazodone 100 mg nightly.  Feels as though he is sleeping more soundly throughout the night.  Also feels as though this is helping with his anxiety as well.  Idopathic pulmonary fibrosis-is followed by Dr. RChase Calleras well as [name] NP.  Was seen yesterday by BWyn [name] telephone visit, was started on a prednisone taper and continued with inhalers.  It was brought up to start morphine to help with dyspnea but he and his wife refused at this point in time.  They do have a follow-up appointment in 4 weeks.  Reports that he continues to struggle with dyspnea and physical activity but he is walking short distances throughout the day.  We talked more about using morphine for dyspnea, he and his wife were both in agreement that if the prednisone taper does not work they will revisit this with pulmonary in 4 weeks. He will start  pulmonary rehab once the COVID-19 restrictions are lifted.  GERD -currently prescribed Nexium 40 mg tablets.  He has taken this in the morning.  Reports no nausea after eating breakfast or lunch but does have some nausea after eating dinner.  Denies any vomiting episodes. Does not have abdominal pain or diarrhea.  Nocturia -was started on Flomax in October 2019.  He reports that he is no longer taking Flomax and his not having any issues with nocturia.     Observations/Objective: Patient sounds cheerful and well on the phone. I do not appreciate any SOB. Speech and thought processing are grossly intact. Patient reported vitals:  Assessment and Plan: 1. IPF (idiopathic pulmonary fibrosis) ([place] -Continue with pulmonary plan of care and follow-up in 4 weeks  2. Insomnia, unspecified type -Seems to be improved with trazodone.  Continue with 100 mg QHS  3. Gastroesophageal reflux disease without esophagitis -We discussed changing the time that he took Nexium lunchtime or switching to a different medication.  Since he Jason Summers has Nexium at home he would like to just try switching to taking this medication at lunchtime.  I am okay with this, and will follow-up in 1 to 2 weeks to see how he is doing  4. Anxiety -Partially due to shortness of breath.  He reports improvement since starting trazodone.  We will continue him on this current dose.  5. Nocturia -DC Flomax as he reports no issues at this time   Follow Up Instructions:   I did not refer this patient for an OV in the next 24 hours for this/these issue(s).  I discussed the assessment and treatment plan with the patient. The patient was provided an opportunity to ask questions and all were answered. The patient agreed with the plan and demonstrated an understanding of the instructions.   The patient was advised to call back or seek an in-person evaluation if the symptoms worsen or if the condition fails to improve as  anticipated.  I provided 25 minutes of non-face-to-face time during this encounter.   [name], NP

## 2018-09-27 ENCOUNTER — Ambulatory Visit (HOSPITAL_COMMUNITY): Payer: Medicare Other

## 2018-10-02 ENCOUNTER — Other Ambulatory Visit: Payer: Self-pay | Admitting: Adult Health

## 2018-10-02 DIAGNOSIS — F419 Anxiety disorder, unspecified: Secondary | ICD-10-CM

## 2018-10-04 NOTE — Telephone Encounter (Signed)
Sent to the pharmacy by e-scribe. 

## 2018-10-05 ENCOUNTER — Encounter: Payer: Self-pay | Admitting: Adult Health

## 2018-10-05 ENCOUNTER — Ambulatory Visit (INDEPENDENT_AMBULATORY_CARE_PROVIDER_SITE_OTHER): Payer: Medicare Other | Admitting: Adult Health

## 2018-10-05 ENCOUNTER — Other Ambulatory Visit: Payer: Self-pay

## 2018-10-05 DIAGNOSIS — K219 Gastro-esophageal reflux disease without esophagitis: Secondary | ICD-10-CM

## 2018-10-05 MED ORDER — PANTOPRAZOLE SODIUM 40 MG PO TBEC: 40.0000 mg | DELAYED_RELEASE_TABLET | Freq: Every day | ORAL | 3 refills | Status: DC

## 2018-10-05 NOTE — Progress Notes (Signed)
Virtual Visit via Telephone Note  I connected with Jason Summers on 10/05/18 at  9:30 AM EDT by telephone and verified that I am speaking with the correct person using two identifiers.   I discussed the limitations, risks, security and privacy concerns of performing an evaluation and management service by telephone and the availability of in person appointments. I also discussed with the patient that there may be a patient responsible charge related to this service. The patient expressed understanding and agreed to proceed.  Location patient: home Location provider: work or home office Participants present for the call: patient, provider Patient did not have a visit in the prior 7 days to address this/these issue(s).   History of Present Illness: 85 year old male who  has a past medical history of Arthritis, Cancer ([place]), Hemorrhoids, Lateral epicondylitis (tennis elbow), Non-small cell lung cancer (NSCLC) ([place]) (dx'f 07/30/15), Odynophagia (11/06/2015), and Spondylosis of cervical joint.  Being evaluated today for follow-up regarding GERD-like symptoms.  He prescribed Nexium 40 mg tablets.  When I talked to him last week he was taking this in the morning but endorsed having nausea and abdominal discomfort for about an hour after eating dinner.  He denied any vomiting episodes or diarrhea.  I had him move his Nexium to lunchtime, he reports that he feels as though his symptoms are somewhat better but he continues to have some abdominal burning for about an hour intermittently in the evening.  Continues to complain of some nausea but has no diarrhea or vomiting.  Denies any issues in the morning or throughout the afternoon.   Observations/Objective: Patient sounds cheerful and well on the phone. I do not appreciate any SOB. Speech and thought processing are grossly intact. Patient reported vitals:  Assessment and Plan: 1. Gastroesophageal reflux disease without esophagitis - pantoprazole  (PROTONIX) 40 MG tablet; Take 1 tablet (40 mg total) by mouth daily.  Dispense: 30 tablet; Refill: 3 -Which to Protonix.  He was advised to give this 2 to 3 weeks to find out if it is going to work for him.  He will follow-up if his symptoms have not resolved.  Consider referral to gastroenterology if needed   I did not refer this patient for an OV in the next 24 hours for this/these issue(s).  I discussed the assessment and treatment plan with the patient. The patient was provided an opportunity to ask questions and all were answered. The patient agreed with the plan and demonstrated an understanding of the instructions.   The patient was advised to call back or seek an in-person evaluation if the symptoms worsen or if the condition fails to improve as anticipated.  I provided 15 minutes of non-face-to-face time during this encounter.   [name], NP

## 2018-10-07 ENCOUNTER — Telehealth (HOSPITAL_COMMUNITY): Payer: Self-pay

## 2018-10-17 NOTE — Progress Notes (Signed)
_0  ID: Jason Summers, male    DOB: 14-Apr-1932, 85 y.o.   MRN: [id number]  Chief Complaint  Patient presents with  . Follow-up    IPF    Referring provider: [name], NP  HPI:  85 year old male former smoker followed in our office for idiopathic pulmonary fibrosis  PMH: GERD, lung cancer, radiation fibrosis of the lung, esophageal strictures, chest pain, headaches Smoker/ Smoking History: Former smoker Maintenance: Ofev Pt of: Dr. Chase Caller  10/19/2018  - Visit   Pleasant 85 year old male patient presenting to our office today for a follow-up for his idiopathic pulmonary fibrosis.  Patient is managed on 0FEV.  Patient had multiple telephone visits for shortness of breath.  Patient felt significantly better when taking prednisone.  He reports that he felt symptomatic relief even on 10 mg of prednisone daily.  Patient denies fevers, chills, body aches, worsening shortness of breath, wheezing.  Patient reports that typically his shortness of breath is baseline for him which is likely due to his idiopathic pulmonary fibrosis and symptoms typically occur with physical exertion such as walking or going upstairs.  Patient is still able to complete all of his ADLs as well as IADLs.  MMRC - Breathlessness Score 2 - on level ground, I walk slower than people of the same age because of breathlessness, or have to stop for breathe when walking to my own pace   Tests:   06/22/2018-CT chest with contrast- spectrum of findings suggestive of basilar predominant fibrotic interstitial lung disease with mild honeycombing asymmetrically involving the right lung with significant progression since February/2019 in September/2019 chest CT.  Findings consistent with UIP.  06/22/2018-echocardiogram- LV ejection fraction 55 to 46%, grade 1 diastolic dysfunction, PAP pressure 45  ONO 06/17/2018 - < 88% for 37 min only.  SIX MIN WALK 10/19/2018 08/12/2018 07/29/2018 07/14/2018 06/25/2018 06/15/2018 10/28/2017   Supplimental Oxygen during Test? (L/min) _1  No No  Tech Comments: patient was increased short of breath with each lap. tolerated well. no complaints of dizziness or lightheadedness. walked at a slow to moderate pace Pt walked at a normal pace completing all required laps having complaints of mild SOB. patient walked at average pace able to hold conversation but sounded winded. patient tolerated well. no desat patient walked at average pace, was a little short of breath walked all 3 laps, tolerated well. no desat Pt walked at a normal pace completing all required laps. Pt had mild SOB. Pt walked at a normal pace completing all required laps. Pt had moderate SOB and was "huffing and puffing" during the walk. steady walk, no complications TA/CMA     FENO:  No results found for: NITRICOXIDE  PFT: No flowsheet data found.  Imaging: No results found.    Specialty Problems      Pulmonary Problems   Pulmonary nodules   Mass of lingula of lung   Non-small cell cancer of left lung ([place])    09/13/2015: Left lower lobe lung biopsy: Overall, the histologic features, in conjunction with the positive staining for cytokeratin 903, cytokeratin 5/6, and p63 support a diagnosis of poorly differentiated squamous cell carcinoma. There is likely sufficient tumor remaining for additional studies, if requested. (JBK:ds 09/17/15)        Pleural effusion on left   Pulmonary emphysema (HCC)   Radiation fibrosis of lung (HCC)   Shortness of breath   IPF (idiopathic pulmonary fibrosis) ([place])    06/22/2018-CT chest with contrast- spectrum of findings suggestive of basilar  predominant fibrotic interstitial lung disease with mild honeycombing asymmetrically involving the right lung with significant progression since February/2019 in September/2019 chest CT.  Findings consistent with UIP.  06/22/2018-echocardiogram- LV ejection fraction 55 to 66%, grade 1 diastolic dysfunction, PA P pressure 45  ONO  06/17/2018 - < 88% for 37 min only.  07/07/2018 - started OFEV          Allergies  Allergen Reactions  . Gentamicin Other (See Comments)    Made eyes red, swollen, hot feeling  . Loteprednol-Tobramycin Other (See Comments)    Other reaction(s): Other (See Comments) Made eyes Red, Swollen, Red, Warm feeling  Other reaction(s): Other (See Comments) Made eyes Red, Swollen, Red, Warm feeling  Made eyes Red, Swollen, Red, Warm feeling     Immunization History  Administered Date(s) Administered  . Influenza Split 03/19/2011, 03/12/2012  . Influenza Whole 06/09/1997, 04/22/2007, 03/14/2008, 03/28/2009, 05/09/2010  . Influenza, High Dose Seasonal PF 04/14/2013, 04/11/2015, 03/02/2017, 03/23/2018  . Influenza,inj,Quad PF,6+ Mos 05/07/2016  . Influenza-Unspecified 04/10/2014  . Pneumococcal Conjugate-13 10/14/2013  . Pneumococcal Polysaccharide-23 06/09/2004, 10/04/2012  . Td 06/09/2005  . Zoster 02/04/2015    Past Medical History:  Diagnosis Date  . Arthritis   . Cancer ([place])    basal cell on right temple  . Hemorrhoids   . Lateral epicondylitis (tennis elbow)   . Non-small cell lung cancer (NSCLC) ([place]) dx'f 07/30/15  . Odynophagia 11/06/2015  . Spondylosis of cervical joint     Tobacco History: Social History   Tobacco Use  Smoking Status Former Smoker  . Packs/day: 0.20  . Years: 62.00  . Pack years: 12.40  . Types: Cigarettes  . Last attempt to quit: 03/10/2011  . Years since quitting: 7.6  Smokeless Tobacco Never Used   Counseling given: Yes  Continue to not smoke  Outpatient Encounter Medications as of 10/19/2018  Medication Sig  . albuterol (PROVENTIL HFA;VENTOLIN HFA) 108 (90 Base) MCG/ACT inhaler Inhale 2 puffs into the lungs every 6 (six) hours as needed for wheezing or shortness of breath.  . calcium carbonate (TUMS - DOSED IN MG ELEMENTAL CALCIUM) 500 MG chewable tablet Chew 1 tablet by mouth 2 (two) times daily.  Marland Kitchen glucosamine-chondroitin 500-400 MG  tablet Take 1 tablet by mouth daily. Reported on 09/11/2015  . OFEV 150 MG CAPS Take 1 tablet by mouth 2 (two) times daily.  . pantoprazole (PROTONIX) 40 MG tablet Take 1 tablet (40 mg total) by mouth daily.  . tamsulosin (FLOMAX) 0.4 MG CAPS capsule Take 1 capsule (0.4 mg total) by mouth daily.  . traZODone (DESYREL) 50 MG tablet TAKE 2 TABLETS BY MOUTH AT BEDTIME  . umeclidinium bromide (INCRUSE ELLIPTA) 62.5 MCG/INH AEPB Inhale 1 puff into the lungs daily.  . [DISCONTINUED] predniSONE (DELTASONE) 10 MG tablet Take 2 tablets (20 mg total) daily for the next 5 days, then 43m for 5 days  . predniSONE (DELTASONE) 10 MG tablet Take 1 tablet (10 mg total) by mouth daily with breakfast.  . umeclidinium bromide (INCRUSE ELLIPTA) 62.5 MCG/INH AEPB Inhale 1 puff into the lungs daily.   No facility-administered encounter medications on file as of 10/19/2018.      Review of Systems  Review of Systems  Constitutional: Positive for fatigue. Negative for activity change, chills, fever and unexpected weight change.  HENT: Negative for postnasal drip, rhinorrhea, sneezing and sore throat.   Eyes: Negative.   Respiratory: Positive for shortness of breath. Negative for cough and wheezing.   Cardiovascular: Negative for chest  pain and palpitations.  Gastrointestinal: Negative for diarrhea, nausea and vomiting.  Endocrine: Negative.   Musculoskeletal: Negative.   Skin: Negative.   Neurological: Negative for dizziness and headaches.  Psychiatric/Behavioral: Negative.  Negative for dysphoric mood. The patient is not nervous/anxious.   All other systems reviewed and are negative.    Physical Exam  BP 108/62 (BP Location: Left Arm, Cuff Size: Normal)   Pulse 95   Temp 97.6 F (36.4 C) (Oral)   Ht 5' 6" (1.676 m)   Wt 168 lb 6.4 oz (76.4 kg)   SpO2 98%   BMI 27.18 kg/m   Wt Readings from Last 5 Encounters:  10/19/18 168 lb 6.4 oz (76.4 kg)  08/18/18 168 lb 11.2 oz (76.5 kg)  08/12/18 165 lb  (74.8 kg)  07/29/18 170 lb 6.4 oz (77.3 kg)  07/20/18 168 lb (76.2 kg)     Physical Exam  Constitutional: He is oriented to person, place, and time and well-developed, well-nourished, and in no distress. No distress.  Chronically ill elderly male  HENT:  Head: Normocephalic and atraumatic.  Right Ear: Hearing and external ear normal.  Left Ear: Hearing and external ear normal.  Nose: Nose normal.  Mouth/Throat: Uvula is midline and oropharynx is clear and moist. No oropharyngeal exudate.  +post nasal drip  Cerumen in both canals bilaterally difficult to visualize TMs  Eyes: Pupils are equal, round, and reactive to light.  Neck: Normal range of motion. Neck supple.  Cardiovascular: Normal rate, regular rhythm and normal heart sounds.  Pulmonary/Chest: Effort normal and breath sounds normal. No accessory muscle usage. No respiratory distress. He has no decreased breath sounds. He has no wheezes. He has no rhonchi. He has no rales.  Scattered squeaks  Abdominal: Soft. Bowel sounds are normal. He exhibits no distension. There is no abdominal tenderness.  Musculoskeletal: Normal range of motion.        General: No edema.  Lymphadenopathy:    He has no cervical adenopathy.  Neurological: He is alert and oriented to person, place, and time. Gait normal.  Skin: Skin is warm and dry. He is not diaphoretic. No erythema.  Psychiatric: Mood, memory, affect and judgment normal.  Nursing note and vitals reviewed.     Lab Results:  CBC    Component Value Date/Time   WBC 8.0 06/15/2018 1224   RBC 4.79 06/15/2018 1224   HGB 13.1 06/15/2018 1224   HGB 13.1 02/12/2018 0841   HGB 14.3 01/27/2017 0826   HCT 39.1 06/15/2018 1224   HCT 43.0 01/27/2017 0826   PLT 245.0 06/15/2018 1224   PLT 214 02/12/2018 0841   PLT 183 01/27/2017 0826   MCV 81.5 06/15/2018 1224   MCV 86.4 01/27/2017 0826   MCH 28.7 02/12/2018 0841   MCHC 33.4 06/15/2018 1224   RDW 15.3 06/15/2018 1224   RDW 14.5  01/27/2017 0826   LYMPHSABS 1.1 06/15/2018 1224   LYMPHSABS 1.2 01/27/2017 0826   MONOABS 0.9 06/15/2018 1224   MONOABS 0.6 01/27/2017 0826   EOSABS 0.2 06/15/2018 1224   EOSABS 0.1 01/27/2017 0826   BASOSABS 0.1 06/15/2018 1224   BASOSABS 0.0 01/27/2017 0826    BMET    Component Value Date/Time   NA 133 (L) 06/15/2018 1224   NA 139 01/27/2017 0826   K 4.3 06/15/2018 1224   K 4.4 01/27/2017 0826   CL 100 06/15/2018 1224   CO2 26 06/15/2018 1224   CO2 25 01/27/2017 0826   GLUCOSE 102 (H) 06/15/2018 1224  GLUCOSE 102 01/27/2017 0826   BUN 20 06/15/2018 1224   BUN 15.5 01/27/2017 0826   CREATININE 0.89 06/15/2018 1224   CREATININE 0.90 02/12/2018 0841   CREATININE 0.9 01/27/2017 0826   CALCIUM 8.9 06/15/2018 1224   CALCIUM 9.1 01/27/2017 0826   GFRNONAA >60 02/12/2018 0841   GFRAA >60 02/12/2018 0841    BNP    Component Value Date/Time   BNP 52.0 08/21/2015 1206    ProBNP No results found for: PROBNP    Assessment & Plan:   IPF (idiopathic pulmonary fibrosis) (HCC) Assessment: UIP on January/2020 CT chest Currently managed on 05 Has occasional bouts of anxiety which is currently managed by primary care Patient has continued to elect to not use nighttime oxygen Patient continues to struggle with dyspnea with physical activity, he feels that this is symptomatically improved while taking prednisone Patient reports that he does have clinical improvement even when taking 10 mg of prednisone daily Has pending referral to pulmonary rehab on hold due to COVID-19 Walk in office today  >>>patient was able to complete 3 laps, oxygen saturations dropped from starting SaO2 of 99% to 92%, patient's heart rate went from 95-108 Breath sounds without wheeze, scattered squeaks  Plan: Start prednisone 10 mg daily indefinitely Continue OFEV >>>hep panel today  6-week follow-up visit with our office with pulmonary function test We will work with our office to ensure you are  able to complete pulmonary function testing which may require that you were tested for COVID-19 prior to this being done Repeat High Res CT in Sept/2020, sooner if patients symptoms do not improve  Discussed with patient again the Dr. Chase Caller had suggested potentially using morphine to help with his dyspnea.  Patient declined at this time.  Patient also declined at this time referral to palliative care as patient is able to complete ADLs and IADLs he would not like to consider this at this time.   Return in about 6 weeks (around 11/30/2018), or if symptoms worsen or fail to improve, for Follow up with Wyn Quaker FNP-C, Follow up for PFT.   Lauraine Rinne, NP 10/19/2018   This appointment was 35 min long with over 50% of the time in direct face-to-face patient care, assessment, plan of care, and follow-up.

## 2018-10-19 ENCOUNTER — Ambulatory Visit (INDEPENDENT_AMBULATORY_CARE_PROVIDER_SITE_OTHER): Payer: Medicare Other | Admitting: Pulmonary Disease

## 2018-10-19 ENCOUNTER — Other Ambulatory Visit: Payer: Self-pay

## 2018-10-19 ENCOUNTER — Telehealth: Payer: Self-pay | Admitting: Pulmonary Disease

## 2018-10-19 ENCOUNTER — Encounter: Payer: Self-pay | Admitting: Pulmonary Disease

## 2018-10-19 VITALS — BP 108/62 | HR 95 | Temp 97.6°F | Ht 66.0 in | Wt 168.4 lb

## 2018-10-19 DIAGNOSIS — J84112 Idiopathic pulmonary fibrosis: Secondary | ICD-10-CM

## 2018-10-19 LAB — HEPATIC FUNCTION PANEL
ALT: 17 U/L (ref 0–53)
AST: 16 U/L (ref 0–37)
Albumin: 3.8 g/dL (ref 3.5–5.2)
Alkaline Phosphatase: 71 U/L (ref 39–117)
Bilirubin, Direct: 0.1 mg/dL (ref 0.0–0.3)
Total Bilirubin: 0.5 mg/dL (ref 0.2–1.2)
Total Protein: 7.1 g/dL (ref 6.0–8.3)

## 2018-10-19 MED ORDER — UMECLIDINIUM BROMIDE 62.5 MCG/INH IN AEPB: 1.0000 | INHALATION_SPRAY | Freq: Every day | RESPIRATORY_TRACT | 0 refills | Status: DC

## 2018-10-19 MED ORDER — PREDNISONE 10 MG PO TABS: 10.0000 mg | ORAL_TABLET | Freq: Every day | ORAL | 1 refills | Status: DC

## 2018-10-19 NOTE — Patient Instructions (Addendum)
Prednisone 45m tablet  >>>Take 1 tablets (10 mg total) daily  >>> Take with food in the morning  ContinueIncruse Ellipta  >>>Take 1 puff daily in the morning right when you wake up >>>Rinse your mouth out after use >>>This is a daily maintenance inhaler, NOTa rescue inhaler >>>Contact our office if you are having difficulties affording or obtaining this medication >>>It is important for you to be able to take this daily and not miss any doses  Only use your albuterol as a rescue medication to be used if you can't catch your breath by resting or doing a relaxed purse lip breathing pattern.  - The less you use it, the better it will work when you need it. - Ok to use up to 2 puffs every 4 hours if you must but call for immediate appointment if use goes up over your usual need - Don't leave home without it !! (think of it like the spare tire for your car)    Continue OFEV as prescribed   Okay to stop overnight oxygen   Continue referral to Pulmonary Rehab   Continue follow up with PCP for chronic management of anxiety   Planned CT follow-up in September/2020   Return in about 6 weeks (around 11/30/2018), or if symptoms worsen or fail to improve, for Follow up with BWyn QuakerFNP-C.  We will work to get you scheduled for pulmonary function test over the next month, we will contact you to get this scheduled.

## 2018-10-19 NOTE — Telephone Encounter (Signed)
Patient's wife called today requesting to speak with B.Mack She wasn't able to be with pt in office appt today and requesting to speak with him today Call back is [phone] [name] will not speak with me regarding her questions or concerns  B.Mack please advise. Thank you.

## 2018-10-19 NOTE — Telephone Encounter (Signed)
Called pt's wife, she is aware Jason Summers will call after 5pm today.

## 2018-10-19 NOTE — Progress Notes (Signed)
Labs are stable. Continue OFEV.   Aaron Edelman

## 2018-10-19 NOTE — Assessment & Plan Note (Addendum)
Assessment: UIP on January/2020 CT chest Currently managed on 05 Has occasional bouts of anxiety which is currently managed by primary care Patient has continued to elect to not use nighttime oxygen Patient continues to struggle with dyspnea with physical activity, he feels that this is symptomatically improved while taking prednisone Patient reports that he does have clinical improvement even when taking 10 mg of prednisone daily Has pending referral to pulmonary rehab on hold due to COVID-19 Walk in office today  >>>patient was able to complete 3 laps, oxygen saturations dropped from starting SaO2 of 99% to 92%, patient's heart rate went from 95-108 Breath sounds without wheeze, scattered squeaks  Plan: Start prednisone 10 mg daily indefinitely Continue OFEV >>>hep panel today  6-week follow-up visit with our office with pulmonary function test We will work with our office to ensure you are able to complete pulmonary function testing which may require that you were tested for COVID-19 prior to this being done Repeat High Res CT in Sept/2020, sooner if patients symptoms do not improve  Discussed with patient again the Dr. Chase Caller had suggested potentially using morphine to help with his dyspnea.  Patient declined at this time.  Patient also declined at this time referral to palliative care as patient is able to complete ADLs and IADLs he would not like to consider this at this time.

## 2018-10-19 NOTE — Telephone Encounter (Signed)
10/19/2018 1714  Called pt spouse. All question answered. Nothing further is needed.  Aaron Edelman

## 2018-10-19 NOTE — Telephone Encounter (Signed)
Will call after clinic today.  You can notify the patient spouse to expect a telephone call.  Wyn Quaker, FNP

## 2018-10-26 ENCOUNTER — Other Ambulatory Visit: Payer: Self-pay | Admitting: Internal Medicine

## 2018-10-27 ENCOUNTER — Other Ambulatory Visit (HOSPITAL_COMMUNITY): Admission: RE | Admit: 2018-10-27 | Payer: Medicare Other | Source: Ambulatory Visit

## 2018-10-29 ENCOUNTER — Other Ambulatory Visit (HOSPITAL_COMMUNITY)
Admission: RE | Admit: 2018-10-29 | Discharge: 2018-10-29 | Disposition: A | Discharge status: Home / Self Care | Payer: Medicare Other | Source: Ambulatory Visit | Attending: Internal Medicine | Admitting: Internal Medicine

## 2018-10-29 DIAGNOSIS — Z1159 Encounter for screening for other viral diseases: Secondary | ICD-10-CM | POA: Insufficient documentation

## 2018-10-30 LAB — NOVEL CORONAVIRUS, NAA (HOSP ORDER, SEND-OUT TO REF LAB; TAT 18-24 HRS): SARS-CoV-2, NAA: NOT DETECTED

## 2018-11-02 NOTE — Progress Notes (Signed)
_0  ID: Jason Summers, male    DOB: [birth year], 85 y.o.   MRN: [id number]  Chief Complaint  Patient presents with  . Follow-up    Attempted PFT, patient was unable to complete    Referring provider: [name], NP  HPI:  85 year old male former smoker followed in our office for idiopathic pulmonary fibrosis  PMH: GERD, lung cancer, radiation fibrosis of the lung, esophageal strictures, chest pain, headaches Smoker/ Smoking History: Former smoker Maintenance: Ofev Pt of: Dr. Chase Caller  11/03/2018  - Visit   85 year old male former smoker followed in our office for IPF.  Patient is managed on 0FEV.  Patient presenting to our office today to complete a pulmonary function test.  Patient has attempted pulmonary function testing multiple times in our office and continues to be unsuccessful.  Today patient was unable to complete the spirometry.  Pulmonary function test was canceled.  Patient is also maintained on 10 mg of prednisone daily.  Patient reports that his breathing has been stable with a combination of 0FEV as well as 10 mg of prednisone.   Tests:   06/22/2018-CT chest with contrast- spectrum of findings suggestive of basilar predominant fibrotic interstitial lung disease with mild honeycombing asymmetrically involving the right lung with significant progression since February/2019 in September/2019 chest CT.  Findings consistent with UIP.  06/22/2018-echocardiogram- LV ejection fraction 55 to 27%, grade 1 diastolic dysfunction, PAP pressure 45  ONO 06/17/2018 - < 88% for 37 min only.  SIX MIN WALK 11/03/2018 10/19/2018 08/12/2018 07/29/2018 07/14/2018 06/25/2018 06/15/2018  Supplimental Oxygen during Test? (L/min) _1  No No  Tech Comments: Pt walked slow to [employer] tolkerated well, was winded by lap 3. No c/o dizziness or lighheadedness. patient was increased short of breath with each lap. tolerated well. no complaints of dizziness or lightheadedness. walked at a slow  to moderate pace Pt walked at a normal pace completing all required laps having complaints of mild SOB. patient walked at average pace able to hold conversation but sounded winded. patient tolerated well. no desat patient walked at average pace, was a little short of breath walked all 3 laps, tolerated well. no desat Pt walked at a normal pace completing all required laps. Pt had mild SOB. Pt walked at a normal pace completing all required laps. Pt had moderate SOB and was "huffing and puffing" during the walk.     FENO:  No results found for: NITRICOXIDE  PFT: No flowsheet data found.  Imaging: No results found.    Specialty Problems      Pulmonary Problems   Pulmonary nodules   Mass of lingula of lung   Non-small cell cancer of left lung ([place])    09/13/2015: Left lower lobe lung biopsy: Overall, the histologic features, in conjunction with the positive staining for cytokeratin 903, cytokeratin 5/6, and p63 support a diagnosis of poorly differentiated squamous cell carcinoma. There is likely sufficient tumor remaining for additional studies, if requested. (JBK:ds 09/17/15)        Pleural effusion on left   Pulmonary emphysema (HCC)   Radiation fibrosis of lung (HCC)   Shortness of breath   IPF (idiopathic pulmonary fibrosis) ([place])    06/22/2018-CT chest with contrast- spectrum of findings suggestive of basilar predominant fibrotic interstitial lung disease with mild honeycombing asymmetrically involving the right lung with significant progression since February/2019 in September/2019 chest CT.  Findings consistent with UIP.  06/22/2018-echocardiogram- LV ejection fraction 55 to 25%, grade 1 diastolic dysfunction, PA  P pressure 45  ONO 06/17/2018 - < 88% for 37 min only.  07/07/2018 - started OFEV          Allergies  Allergen Reactions  . Gentamicin Other (See Comments)    Made eyes red, swollen, hot feeling  . Loteprednol-Tobramycin Other (See Comments)    Other  reaction(s): Other (See Comments) Made eyes Red, Swollen, Red, Warm feeling  Other reaction(s): Other (See Comments) Made eyes Red, Swollen, Red, Warm feeling  Made eyes Red, Swollen, Red, Warm feeling     Immunization History  Administered Date(s) Administered  . Influenza Split 03/19/2011, 03/12/2012  . Influenza Whole 06/09/1997, 04/22/2007, 03/14/2008, 03/28/2009, 05/09/2010  . Influenza, High Dose Seasonal PF 04/14/2013, 04/11/2015, 03/02/2017, 03/23/2018  . Influenza,inj,Quad PF,6+ Mos 05/07/2016  . Influenza-Unspecified 04/10/2014  . Pneumococcal Conjugate-13 10/14/2013  . Pneumococcal Polysaccharide-23 06/09/2004, 10/04/2012  . Td 06/09/2005  . Zoster 02/04/2015    Past Medical History:  Diagnosis Date  . Arthritis   . 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 Hemorrhoids   . Lateral epicondylitis (tennis elbow)   . Non-small cell lung cancer (NSCLC) ([place]) dx'f 07/30/15  . Odynophagia 11/06/2015  . Spondylosis of cervical joint     Tobacco History: Social History   Tobacco Use  Smoking Status Former Smoker  . Packs/day: 0.20  . Years: 62.00  . Pack years: 12.40  . Types: Cigarettes  . Last attempt to quit: 03/10/2011  . Years since quitting: 7.6  Smokeless Tobacco Never Used   Counseling given: Not Answered   Continue to not smoke  Outpatient Encounter Medications as of 11/03/2018  Medication Sig  . albuterol (PROVENTIL HFA;VENTOLIN HFA) 108 (90 Base) MCG/ACT inhaler Inhale 2 puffs into the lungs every 6 (six) hours as needed for wheezing or shortness of breath.  . calcium carbonate (TUMS - DOSED IN MG ELEMENTAL CALCIUM) 500 MG chewable tablet Chew 1 tablet by mouth 2 (two) times daily.  Marland Kitchen glucosamine-chondroitin 500-400 MG tablet Take 1 tablet by mouth daily. Reported on 09/11/2015  . OFEV 150 MG CAPS Take 1 tablet by mouth 2 (two) times daily.  . pantoprazole (PROTONIX) 40 MG tablet Take 1 tablet (40 mg total) by mouth daily.  . predniSONE (DELTASONE)  10 MG tablet Take 1 tablet (10 mg total) by mouth daily with breakfast.  . tamsulosin (FLOMAX) 0.4 MG CAPS capsule Take 1 capsule (0.4 mg total) by mouth daily.  . traZODone (DESYREL) 50 MG tablet TAKE 2 TABLETS BY MOUTH AT BEDTIME  . umeclidinium bromide (INCRUSE ELLIPTA) 62.5 MCG/INH AEPB Inhale 1 puff into the lungs daily.  . [DISCONTINUED] umeclidinium bromide (INCRUSE ELLIPTA) 62.5 MCG/INH AEPB Inhale 1 puff into the lungs daily.  Marland Kitchen umeclidinium bromide (INCRUSE ELLIPTA) 62.5 MCG/INH AEPB Inhale 1 puff into the lungs daily.   No facility-administered encounter medications on file as of 11/03/2018.      Review of Systems  Review of Systems  Constitutional: Positive for fatigue. Negative for activity change, chills, fever and unexpected weight change.  HENT: Negative for sinus pressure, sinus pain, sneezing and sore throat.   Eyes: Negative.   Respiratory: Positive for shortness of breath. Negative for cough and wheezing.   Cardiovascular: Negative for chest pain and palpitations.  Gastrointestinal: Negative for diarrhea, nausea and vomiting.  Endocrine: Negative.   Musculoskeletal: Negative.   Skin: Negative.   Neurological: Negative for dizziness and headaches.  Psychiatric/Behavioral: Negative.  Negative for dysphoric mood. The patient is not nervous/anxious.  All other systems reviewed and are negative.    Physical Exam  BP 130/62 (BP Location: Left Arm, Cuff Size: Normal)   Pulse 86   Temp 97.7 F (36.5 C) (Oral)   Ht _0  (1.676 m)   Wt 163 lb (73.9 kg)   SpO2 96%   BMI 26.31 kg/m   Wt Readings from Last 5 Encounters:  11/03/18 163 lb (73.9 kg)  10/19/18 168 lb 6.4 oz (76.4 kg)  08/18/18 168 lb 11.2 oz (76.5 kg)  08/12/18 165 lb (74.8 kg)  07/29/18 170 lb 6.4 oz (77.3 kg)     Physical Exam  Constitutional: He is oriented to person, place, and time and well-developed, well-nourished, and in no distress. No distress.  HENT:  Head: Normocephalic and  atraumatic.  Right Ear: Hearing, tympanic membrane, external ear and ear canal normal.  Left Ear: Hearing, tympanic membrane, external ear and ear canal normal.  Nose: Nose normal. Right sinus exhibits no maxillary sinus tenderness and no frontal sinus tenderness. Left sinus exhibits no maxillary sinus tenderness and no frontal sinus tenderness.  Mouth/Throat: Uvula is midline and oropharynx is clear and moist. No oropharyngeal exudate.  Post nasal drip   Eyes: Pupils are equal, round, and reactive to light.  Neck: Normal range of motion. Neck supple.  Cardiovascular: Normal rate, regular rhythm and normal heart sounds.  Pulmonary/Chest: Effort normal and breath sounds normal. No accessory muscle usage. No respiratory distress. He has no decreased breath sounds. He has no wheezes. He has no rhonchi.  A few scattered squeaks  Abdominal: Soft. Bowel sounds are normal. He exhibits no distension. There is no abdominal tenderness.  Musculoskeletal: Normal range of motion.        General: No edema.  Lymphadenopathy:    He has no cervical adenopathy.  Neurological: He is alert and oriented to person, place, and time. Gait normal.  Skin: Skin is warm and dry. He is not diaphoretic. No erythema.  Psychiatric: Mood, memory, affect and judgment normal.  Nursing note and vitals reviewed.     Lab Results:  CBC    Component Value Date/Time   WBC 8.0 06/15/2018 1224   RBC 4.79 06/15/2018 1224   HGB 13.1 06/15/2018 1224   HGB 13.1 02/12/2018 0841   HGB 14.3 01/27/2017 0826   HCT 39.1 06/15/2018 1224   HCT 43.0 01/27/2017 0826   PLT 245.0 06/15/2018 1224   PLT 214 02/12/2018 0841   PLT 183 01/27/2017 0826   MCV 81.5 06/15/2018 1224   MCV 86.4 01/27/2017 0826   MCH 28.7 02/12/2018 0841   MCHC 33.4 06/15/2018 1224   RDW 15.3 06/15/2018 1224   RDW 14.5 01/27/2017 0826   LYMPHSABS 1.1 06/15/2018 1224   LYMPHSABS 1.2 01/27/2017 0826   MONOABS 0.9 06/15/2018 1224   MONOABS 0.6 01/27/2017 0826    EOSABS 0.2 06/15/2018 1224   EOSABS 0.1 01/27/2017 0826   BASOSABS 0.1 06/15/2018 1224   BASOSABS 0.0 01/27/2017 0826    BMET    Component Value Date/Time   NA 133 (L) 06/15/2018 1224   NA 139 01/27/2017 0826   K 4.3 06/15/2018 1224   K 4.4 01/27/2017 0826   CL 100 06/15/2018 1224   CO2 26 06/15/2018 1224   CO2 25 01/27/2017 0826   GLUCOSE 102 (H) 06/15/2018 1224   GLUCOSE 102 01/27/2017 0826   BUN 20 06/15/2018 1224   BUN 15.5 01/27/2017 0826   CREATININE 0.89 06/15/2018 1224   CREATININE 0.90 02/12/2018 0841  CREATININE 0.9 01/27/2017 0826   CALCIUM 8.9 06/15/2018 1224   CALCIUM 9.1 01/27/2017 0826   GFRNONAA >60 02/12/2018 0841   GFRAA >60 02/12/2018 0841    BNP    Component Value Date/Time   BNP 52.0 08/21/2015 1206    ProBNP No results found for: PROBNP    Assessment & Plan:   Pulmonary emphysema (HCC) Assessment: Patient reports using his rescue inhaler 1 time daily Continues to be maintained on Incruse Ellipta  Plan: Continue Incruse Ellipta  IPF (idiopathic pulmonary fibrosis) (HCC) Assessment: UIP on January/2020 CT chest Currently managed on OFEV Currently managed on 10 mg of prednisone Patient feels clinically stable with the addition of 10 mg of prednisone and feels that his breathing has stabilized if not partially improved Patient was able to walk today in office on room air, oxygenation dropped to 90%, mild tachycardia which compares equally to his previous walk in office in May/2020 Breath sounds are without wheeze, scattered squeaks  Plan: Continue OFEV Continue prednisone 6-week follow-up with our office Repeat high-resolution CT chest in September/2020    Return in about 6 weeks (around 12/15/2018), or if symptoms worsen or fail to improve, for Follow up with Wyn Quaker FNP-C, Follow up with Dr. Purnell Shoemaker.   Lauraine Rinne, NP 11/03/2018   This appointment was 18 minutes long with over 50% of the time in direct face-to-face  patient care, assessment, plan of care, and follow-up.

## 2018-11-03 ENCOUNTER — Ambulatory Visit (INDEPENDENT_AMBULATORY_CARE_PROVIDER_SITE_OTHER): Payer: Medicare Other | Admitting: Pulmonary Disease

## 2018-11-03 ENCOUNTER — Other Ambulatory Visit: Payer: Self-pay

## 2018-11-03 ENCOUNTER — Other Ambulatory Visit: Payer: Self-pay | Admitting: Internal Medicine

## 2018-11-03 ENCOUNTER — Encounter: Payer: Self-pay | Admitting: Pulmonary Disease

## 2018-11-03 VITALS — BP 130/62 | HR 86 | Temp 97.7°F | Ht 66.0 in | Wt 163.0 lb

## 2018-11-03 DIAGNOSIS — J84112 Idiopathic pulmonary fibrosis: Secondary | ICD-10-CM

## 2018-11-03 DIAGNOSIS — J439 Emphysema, unspecified: Secondary | ICD-10-CM | POA: Diagnosis not present

## 2018-11-03 MED ORDER — UMECLIDINIUM BROMIDE 62.5 MCG/INH IN AEPB: 1.0000 | INHALATION_SPRAY | Freq: Every day | RESPIRATORY_TRACT | 0 refills | Status: DC

## 2018-11-03 NOTE — Assessment & Plan Note (Signed)
Assessment: UIP on January/2020 CT chest Currently managed on OFEV Currently managed on 10 mg of prednisone Patient feels clinically stable with the addition of 10 mg of prednisone and feels that his breathing has stabilized if not partially improved Patient was able to walk today in office on room air, oxygenation dropped to 90%, mild tachycardia which compares equally to his previous walk in office in May/2020 Breath sounds are without wheeze, scattered squeaks  Plan: Continue OFEV Continue prednisone 6-week follow-up with our office Repeat high-resolution CT chest in September/2020

## 2018-11-03 NOTE — Assessment & Plan Note (Signed)
Assessment: Patient reports using his rescue inhaler 1 time daily Continues to be maintained on Incruse Ellipta  Plan: Continue Incruse Ellipta

## 2018-11-03 NOTE — Patient Instructions (Addendum)
Prednisone 9m tablet  >>>Take 1 tablets (10 mg total) daily  >>>Take with food in the morning  ContinueIncruse Ellipta  >>>Take 1 puff daily in the morning right when you wake up >>>Rinse your mouth out after use >>>This is a daily maintenance inhaler, NOTa rescue inhaler >>>Contact our office if you are having difficulties affording or obtaining this medication >>>It is important for you to be able to take this daily and not miss any doses  Only use your albuterol as a rescue medication to be used if you can't catch your breath by resting or doing a relaxed purse lip breathing pattern.  - The less you use it, the better it will work when you need it. - Ok to use up to 2 puffs every 4 hours if you must but call for immediate appointment if use goes up over your usual need - Don't leave home without it !! (think of it like the spare tire for your car)  Continue OFEV as prescribed   Continue referral to Pulmonary Rehab   Continuefollow up with PCP for chronic management of anxiety  Planned CT follow-up in September/2020   Return in about 6 weeks (around 12/15/2018), or if symptoms worsen or fail to improve, for Follow up with BWyn QuakerFNP-C, Follow up with Dr. RPurnell Shoemaker    Coronavirus (COVID-19) Are you at risk?  Are you at risk for the Coronavirus (COVID-19)?  To be considered HIGH RISK for Coronavirus (COVID-19), you have to meet the following criteria:  . Traveled to [place] or in the [place] or [place] and have fever, cough, and shortness of breath within the last 2 weeks of travel OR . Been in close contact with a person diagnosed with COVID-19 within the last 2 weeks and have fever, cough, and shortness of breath . IF YOU DO NOT MEET THESE CRITERIA, YOU ARE CONSIDERED LOW RISK FOR COVID-19.  What to do if you are HIGH RISK for COVID-19?  .Marland KitchenIf you are having a medical  emergency, call 911. . Seek medical care right away. Before you go to a doctor's office, urgent care or emergency department, call ahead and tell them about your recent travel, contact with someone diagnosed with COVID-19, and your symptoms. You should receive instructions from your physician's office regarding next steps of care.  . When you arrive at healthcare provider, tell the healthcare staff immediately you have returned from visiting [place] or traveled in the [place] or [place] in the last two weeks or you have been in close contact with a person diagnosed with COVID-19 in the last 2 weeks.   . Tell the health care staff about your symptoms: fever, cough and shortness of breath. . After you have been seen by a medical provider, you will be either: o Tested for (COVID-19) and discharged home on quarantine except to seek medical care if symptoms worsen, and asked to  - Stay home and avoid contact with others until you get your results (4-5 days)  - Avoid travel on public transportation if possible (such as bus, train, or airplane) or o Sent to the Emergency Department by EMS for evaluation, COVID-19 testing, and possible admission depending on your condition and test results.  What to do if you are LOW RISK for COVID-19?  Reduce your risk of any infection by using the  same precautions used for avoiding the common cold or flu:  Marland Kitchen Wash your hands often with soap and warm water for at least 20 seconds.  If soap and water are not readily available, use an alcohol-based hand sanitizer with at least 60% alcohol.  . If coughing or sneezing, cover your mouth and nose by coughing or sneezing into the elbow areas of your shirt or coat, into a tissue or into your sleeve (not your hands). . Avoid shaking hands with others and consider head nods or verbal greetings only. . Avoid touching your eyes, nose, or mouth with  unwashed hands.  . Avoid close contact with people who are sick. . Avoid places or events with large numbers of people in one location, like concerts or sporting events. . Carefully consider travel plans you have or are making. . If you are planning any travel outside or inside the [place], visit the CDC's Travelers' Health webpage for the latest health notices. . If you have some symptoms but not all symptoms, continue to monitor at home and seek medical attention if your symptoms worsen. . If you are having a medical emergency, call 911.   [place] / e-Visit: [email or web address]         [facility]: [facility]: [phone]                   [facility]: [phone]           It is flu season:   >>> Best ways to protect herself from the flu: Receive the yearly flu vaccine, practice good hand hygiene washing with soap and also using hand sanitizer when available, eat a nutritious meals, get adequate rest, hydrate appropriately   Please contact the office if your symptoms worsen or you have concerns that you are not improving.   Thank you for choosing [facility] for your healthcare, and for allowing [place] to partner with you on your healthcare journey. I am thankful to be able to provide care to you today.   Wyn Quaker FNP-C

## 2018-11-18 ENCOUNTER — Telehealth: Payer: Self-pay | Admitting: Internal Medicine

## 2018-11-18 MED ORDER — PREDNISONE 10 MG PO TABS: 15.0000 mg | ORAL_TABLET | Freq: Every day | ORAL | 1 refills | Status: DC

## 2018-11-18 NOTE — Telephone Encounter (Signed)
Yes okay to send.  Can give 30 tablets with 1 refill.  We can further address at follow-up visit in [place], [place]

## 2018-11-18 NOTE — Telephone Encounter (Signed)
Rx for 80m prednisone has been sent to pharmacy for pt. Per BAaron Edelman please make sure pharmacy will half pills for pt to make sure he is only taking 1.5tabs daily for 145m I did place a note in the note to pharmacy section stating to them to make sure they do half pills so pt will only be taking 1.5tabs for 1555m  Called and spoke with pt's wife LauMickel Baastting her know this was going to be done and she verbalized understanding. Nothing further needed.

## 2018-11-18 NOTE — Telephone Encounter (Signed)
Primary Pulmonologist: MR Last office visit and with whom: 11/03/2018 with Wyn Quaker What do we see them for (pulmonary problems): IPF Last OV assessment/plan: Assessment & Plan Note by Lauraine Rinne, NP at 11/03/2018 10:00 AM Author: Lauraine Rinne, NP Author Type: Nurse Practitioner Filed: 11/03/2018 10:02 AM  Note Status: Written Cosign: Cosign Not Required Encounter Date: 11/03/2018  Problem: IPF (idiopathic pulmonary fibrosis) ([place])  Editor: Lauraine Rinne, NP (Nurse Practitioner)    Assessment: UIP on January/2020 CT chest Currently managed on OFEV Currently managed on 10 mg of prednisone Patient feels clinically stable with the addition of 10 mg of prednisone and feels that his breathing has stabilized if not partially improved Patient was able to walk today in office on room air, oxygenation dropped to 90%, mild tachycardia which compares equally to his previous walk in office in May/2020 Breath sounds are without wheeze, scattered squeaks  Plan: Continue OFEV Continue prednisone 6-week follow-up with our office Repeat high-resolution CT chest in September/2020    Assessment & Plan Note by Lauraine Rinne, NP at 11/03/2018 9:59 AM Author: Lauraine Rinne, NP Author Type: Nurse Practitioner Filed: 11/03/2018 10:00 AM  Note Status: Written Cosign: Cosign Not Required Encounter Date: 11/03/2018  Problem: Pulmonary emphysema ([place])  Editor: Lauraine Rinne, NP (Nurse Practitioner)    Assessment: Patient reports using his rescue inhaler 1 time daily Continues to be maintained on Incruse Ellipta  Plan: Continue Incruse Ellipta    Patient Instructions by Lauraine Rinne, NP at 11/03/2018 10:30 AM Author: Lauraine Rinne, NP Author Type: Nurse Practitioner Filed: 11/03/2018 9:44 AM  Note Status: Addendum Cosign: Cosign Not Required Encounter Date: 11/03/2018  Editor: Lauraine Rinne, NP (Nurse Practitioner)  Prior Versions: 1. Lauraine Rinne, NP (Nurse Practitioner) at 11/03/2018 9:42 AM -  Signed    Prednisone 21m tablet  >>>Take 1tablets (10 mg total) daily  >>>Take with food in the morning  ContinueIncruse Ellipta  >>>Take 1 puff daily in the morning right when you wake up >>>Rinse your mouth out after use >>>This is a daily maintenance inhaler, NOTa rescue inhaler >>>Contact our office if you are having difficulties affording or obtaining this medication >>>It is important for you to be able to take this daily and not miss any doses  Only use your albuterol as a rescue medication to be used if you can't catch your breath by resting or doing a relaxed purse lip breathing pattern.  - The less you use it, the better it will work when you need it. - Ok to use up to 2 puffs every 4 hours if you must but call for immediate appointment if use goes up over your usual need - Don't leave home without it !! (think of it like the spare tire for your car)  Continue OFEV as prescribed   Continue referral to Pulmonary Rehab   Continuefollow up with PCP for chronic management of anxiety  Planned CT follow-up in September/2020   Return in about 6 weeks (around 12/15/2018), or if symptoms worsen or fail to improve, for Follow up with BWyn QuakerFNP-C, Follow up with Dr. RPurnell Shoemaker     Was appointment offered to patient (explain)?  Requesting to have prednisone refilled and dose increased   Reason for call: called and spoke with pt's wife LMickel Baaswho is requesting to have prednisone refilled and also have the dose changed from 157mto 1520mPer LauMickel Baashen pt was on the 3m33me was not  having that much problems with SOB but then when the dose was decreased to 34m, she stated he began to have worsening SOB. Pt is using rescue inhaler at least once a day. Pt is still using Incruse daily as prescribed and also taking the OFEV as prescribed.  Pt denies any complaints of fever, body aches, or chills.  Since pt was doing better on the 165mof prednisone, BrAaron Edelman please advise if you are okay for [place] send Rx to pharmacy for pt to be on 1585mrednisone daily. Thanks!

## 2018-11-30 ENCOUNTER — Ambulatory Visit: Payer: Medicare Other | Admitting: Pulmonary Disease

## 2018-12-02 ENCOUNTER — Telehealth: Payer: Self-pay | Admitting: Internal Medicine

## 2018-12-02 NOTE — Telephone Encounter (Signed)
Call made to patient, his wife state he is having increased SOB and she feels it is getting worse. She states it has been going on for about a week. Denies chest pain. She states despite the incruse inhaler he is still very SOB and she is requesting that he be seen. Denies that the patient is having any other symptoms. Appt made. Nothing further is needed at this time.

## 2018-12-03 ENCOUNTER — Other Ambulatory Visit: Payer: Self-pay

## 2018-12-03 ENCOUNTER — Ambulatory Visit (INDEPENDENT_AMBULATORY_CARE_PROVIDER_SITE_OTHER): Payer: Medicare Other | Admitting: Pulmonary Disease

## 2018-12-03 ENCOUNTER — Ambulatory Visit (INDEPENDENT_AMBULATORY_CARE_PROVIDER_SITE_OTHER): Payer: Medicare Other

## 2018-12-03 ENCOUNTER — Encounter: Payer: Self-pay | Admitting: Pulmonary Disease

## 2018-12-03 VITALS — BP 110/68 | HR 74 | Temp 97.8°F | Ht 66.0 in | Wt 167.4 lb

## 2018-12-03 DIAGNOSIS — J84112 Idiopathic pulmonary fibrosis: Secondary | ICD-10-CM

## 2018-12-03 MED ORDER — PREDNISONE 10 MG PO TABS: ORAL_TABLET | ORAL | 0 refills | Status: AC

## 2018-12-03 NOTE — Progress Notes (Signed)
_0  ID: Jason Summers, male    DOB: 28-Feb-1932, 85 y.o.   MRN: [id number]  Chief Complaint  Patient presents with  . Follow-up    IPF - Short of breath     Referring provider: [name], NP  HPI:  85 year old male former smoker followed in our office for idiopathic pulmonary fibrosis  PMH: GERD, lung cancer, radiation fibrosis of the lung, esophageal strictures, chest pain, headaches Smoker/ Smoking History: Former smoker Maintenance: [place] [employer]  Pt of: Dr. Chase Caller   12/03/2018  - Visit   85 year old male former smoker followed in our office for idiopathic pulmonary fibrosis.  Patient is managed on 0FEV.  Patient presenting to our office today with reports of occasional bouts of shortness of breath.  Patient was started on daily prednisone to help with management of symptoms.  Patient currently taking 15 mg of prednisone daily.  Patient reports that 4 weeks ago he had worsening shortness of breath reports that over the past couple of days this is felt better.  Patient has not had a recent chest x-ray.  Patient reports that the shortness of breath is random and does not have any pattern to it.  He does have more shortness of breath with physical exertion.  Patient was able to tolerate walk in office today and completed 3 laps on room air.  Patient's oxygen saturation started at 100% and dropped to 92% on the third lap.   Tests:   06/22/2018-CT chest with contrast- spectrum of findings suggestive of basilar predominant fibrotic interstitial lung disease with mild honeycombing asymmetrically involving the right lung with significant progression since February/2019 in September/2019 chest CT.  Findings consistent with UIP.  06/22/2018-echocardiogram- LV ejection fraction 55 to 92%, grade 1 diastolic dysfunction, PAP pressure 45  ONO 06/17/2018 - < 88% for 37 min only.  SIX MIN WALK 12/03/2018 11/03/2018 10/19/2018 08/12/2018 07/29/2018 07/14/2018 06/25/2018  Supplimental Oxygen  during Test? (L/min) _1  No No  Tech Comments: Pt. did well with walk, pt. did get sob on last half of 3rd lap.///pmc Pt walked slow to moderste pace tolkerated well, was winded by lap 3. No c/o dizziness or lighheadedness. patient was increased short of breath with each lap. tolerated well. no complaints of dizziness or lightheadedness. walked at a slow to moderate pace Pt walked at a normal pace completing all required laps having complaints of mild SOB. patient walked at average pace able to hold conversation but sounded winded. patient tolerated well. no desat patient walked at average pace, was a little short of breath walked all 3 laps, tolerated well. no desat Pt walked at a normal pace completing all required laps. Pt had mild SOB.     FENO:  No results found for: NITRICOXIDE  PFT: No flowsheet data found.  Imaging: Dg Chest 2 View  Result Date: 12/03/2018 CLINICAL DATA:  Shortness of breath and wheezing. EXAM: CHEST - 2 VIEW COMPARISON:  Chest CT 06/22/2018 and chest x-ray 11/11/2016 FINDINGS: The cardiac silhouette, mediastinal and hilar contours are within normal limits and stable. Severe chronic lung disease with pulmonary fibrosis which appears progressive since the prior chest x-ray from 2018. Chronic left basilar pleural thickening also noted. I do not see any definite acute overlying pulmonary process such as edema or infiltrate. No worrisome mass lesions. IMPRESSION: Severe chronic lung disease without obvious acute overlying pulmonary process. Electronically Signed   By: Marijo Sanes M.D.   On: 12/03/2018 11:18  Specialty Problems      Pulmonary Problems   Pulmonary nodules   Mass of lingula of lung   Non-small cell cancer of left lung ([place])    09/13/2015: Left lower lobe lung biopsy: Overall, the histologic features, in conjunction with the positive staining for cytokeratin 903, cytokeratin 5/6, and p63 support a diagnosis of poorly differentiated squamous  cell carcinoma. There is likely sufficient tumor remaining for additional studies, if requested. (JBK:ds 09/17/15)        Pleural effusion on left   Pulmonary emphysema ([facility])   Radiation fibrosis of lung ([facility])   Shortness of breath   IPF (idiopathic pulmonary fibrosis) ([place])    06/22/2018-CT chest with contrast- spectrum of findings suggestive of basilar predominant fibrotic interstitial lung disease with mild honeycombing asymmetrically involving the right lung with significant progression since February/2019 in September/2019 chest CT.  Findings consistent with UIP.  06/22/2018-echocardiogram- LV ejection fraction 55 to 02%, grade 1 diastolic dysfunction, PA P pressure 45  ONO 06/17/2018 - < 88% for 37 min only.  07/07/2018 - started OFEV          Allergies  Allergen Reactions  . Gentamicin Other (See Comments)    Made eyes red, swollen, hot feeling  . Loteprednol-Tobramycin Other (See Comments)    Other reaction(s): Other (See Comments) Made eyes Red, Swollen, Red, Warm feeling  Other reaction(s): Other (See Comments) Made eyes Red, Swollen, Red, Warm feeling  Made eyes Red, Swollen, Red, Warm feeling     Immunization History  Administered Date(s) Administered  . Influenza Split 03/19/2011, 03/12/2012  . Influenza Whole 06/09/1997, 04/22/2007, 03/14/2008, 03/28/2009, 05/09/2010  . Influenza, High Dose Seasonal PF 04/14/2013, 04/11/2015, 03/02/2017, 03/23/2018  . Influenza,inj,Quad PF,6+ Mos 05/07/2016  . Influenza-Unspecified 04/10/2014  . Pneumococcal Conjugate-13 10/14/2013  . Pneumococcal Polysaccharide-23 06/09/2004, 10/04/2012  . Td 06/09/2005  . Zoster 02/04/2015    Past Medical History:  Diagnosis Date  . Arthritis   . Cancer ([place])    basal cell on right temple  . Hemorrhoids   . Lateral epicondylitis (tennis elbow)   . Non-small cell lung cancer (NSCLC) ([place]) dx'f 07/30/15  . Odynophagia 11/06/2015  . Spondylosis of cervical joint    Tobacco History:  Social History   Tobacco Use  Smoking Status Former Smoker  . Packs/day: 0.20  . Years: 62.00  . Pack years: 12.40  . Types: Cigarettes  . Quit date: 03/10/2011  . Years since quitting: 7.7  Smokeless Tobacco Never Used   Counseling given: Yes  Continue to not smoke  Outpatient Encounter Medications as of 12/03/2018  Medication Sig  . albuterol (PROVENTIL HFA;VENTOLIN HFA) 108 (90 Base) MCG/ACT inhaler Inhale 2 puffs into the lungs every 6 (six) hours as needed for wheezing or shortness of breath.  . calcium carbonate (TUMS - DOSED IN MG ELEMENTAL CALCIUM) 500 MG chewable tablet Chew 1 tablet by mouth 2 (two) times daily.  Marland Kitchen glucosamine-chondroitin 500-400 MG tablet Take 1 tablet by mouth daily. Reported on 09/11/2015  . OFEV 150 MG CAPS Take 1 tablet by mouth 2 (two) times daily.  . pantoprazole (PROTONIX) 40 MG tablet Take 1 tablet (40 mg total) by mouth daily.  . predniSONE (DELTASONE) 10 MG tablet Take 1.5 tablets (15 mg total) by mouth daily with breakfast.  . tamsulosin (FLOMAX) 0.4 MG CAPS capsule Take 1 capsule (0.4 mg total) by mouth daily.  Marland Kitchen umeclidinium bromide (INCRUSE ELLIPTA) 62.5 MCG/INH AEPB Inhale 1 puff into the lungs daily.  Marland Kitchen umeclidinium bromide (INCRUSE  ELLIPTA) 62.5 MCG/INH AEPB Inhale 1 puff into the lungs daily.  . predniSONE (DELTASONE) 10 MG tablet Take 4 tablets (40 mg total) by mouth daily with breakfast for 5 days, THEN 3 tablets (30 mg total) daily with breakfast for 5 days, THEN 2 tablets (20 mg total) daily with breakfast for 5 days.  . traZODone (DESYREL) 50 MG tablet TAKE 2 TABLETS BY MOUTH AT BEDTIME (Patient not taking: Reported on 12/03/2018)   No facility-administered encounter medications on file as of 12/03/2018.      Review of Systems  Review of Systems  Constitutional: Positive for fatigue. Negative for activity change, chills, fever and unexpected weight change.  HENT: Negative for postnasal drip, rhinorrhea, sneezing and sore throat.    Eyes: Negative.   Respiratory: Positive for shortness of breath. Negative for cough and wheezing.   Cardiovascular: Negative for chest pain and palpitations.  Gastrointestinal: Negative for diarrhea, nausea and vomiting.  Endocrine: Negative.   Musculoskeletal: Negative.   Skin: Negative.   Neurological: Negative for dizziness and headaches.  Psychiatric/Behavioral: Negative.  Negative for dysphoric mood. The patient is not nervous/anxious.   All other systems reviewed and are negative.    Physical Exam  BP 110/68 (BP Location: Left Arm, Patient Position: Sitting, Cuff Size: Normal)   Pulse 74   Temp 97.8 F (36.6 C) (Oral)   Ht _0  (1.676 m)   Wt 167 lb 6.4 oz (75.9 kg)   SpO2 99%   BMI 27.02 kg/m   Wt Readings from Last 5 Encounters:  12/03/18 167 lb 6.4 oz (75.9 kg)  11/03/18 163 lb (73.9 kg)  10/19/18 168 lb 6.4 oz (76.4 kg)  08/18/18 168 lb 11.2 oz (76.5 kg)  08/12/18 165 lb (74.8 kg)    Physical Exam  Constitutional: He is oriented to person, place, and time and well-developed, well-nourished, and in no distress. No distress.  HENT:  Head: Normocephalic and atraumatic.  Right Ear: Hearing, tympanic membrane, external ear and ear canal normal.  Left Ear: Hearing, tympanic membrane, external ear and ear canal normal.  Nose: Nose normal.  Mouth/Throat: Uvula is midline and oropharynx is clear and moist. No oropharyngeal exudate.  Eyes: Pupils are equal, round, and reactive to light.  Neck: Normal range of motion. Neck supple.  Cardiovascular: Normal rate, regular rhythm and normal heart sounds.  Pulmonary/Chest: Effort normal. No accessory muscle usage. No respiratory distress. He has no decreased breath sounds. He has wheezes (exp wheezes). He has no rhonchi. He has rales (Bilateral basal crackles).  Abdominal: Soft. Bowel sounds are normal. There is no abdominal tenderness.  Musculoskeletal: Normal range of motion.        General: No edema.  Lymphadenopathy:     He has no cervical adenopathy.  Neurological: He is alert and oriented to person, place, and time. Gait normal.  Tolerated walk fine today   Skin: Skin is warm and dry. He is not diaphoretic. No erythema.  Psychiatric: Mood, memory, affect and judgment normal.  Nursing note and vitals reviewed.     Lab Results:  CBC    Component Value Date/Time   WBC 8.0 06/15/2018 1224   RBC 4.79 06/15/2018 1224   HGB 13.1 06/15/2018 1224   HGB 13.1 02/12/2018 0841   HGB 14.3 01/27/2017 0826   HCT 39.1 06/15/2018 1224   HCT 43.0 01/27/2017 0826   PLT 245.0 06/15/2018 1224   PLT 214 02/12/2018 0841   PLT 183 01/27/2017 0826   MCV 81.5 06/15/2018 1224  MCV 86.4 01/27/2017 0826   MCH 28.7 02/12/2018 0841   MCHC 33.4 06/15/2018 1224   RDW 15.3 06/15/2018 1224   RDW 14.5 01/27/2017 0826   LYMPHSABS 1.1 06/15/2018 1224   LYMPHSABS 1.2 01/27/2017 0826   MONOABS 0.9 06/15/2018 1224   MONOABS 0.6 01/27/2017 0826   EOSABS 0.2 06/15/2018 1224   EOSABS 0.1 01/27/2017 0826   BASOSABS 0.1 06/15/2018 1224   BASOSABS 0.0 01/27/2017 0826    BMET    Component Value Date/Time   NA 133 (L) 06/15/2018 1224   NA 139 01/27/2017 0826   K 4.3 06/15/2018 1224   K 4.4 01/27/2017 0826   CL 100 06/15/2018 1224   CO2 26 06/15/2018 1224   CO2 25 01/27/2017 0826   GLUCOSE 102 (H) 06/15/2018 1224   GLUCOSE 102 01/27/2017 0826   BUN 20 06/15/2018 1224   BUN 15.5 01/27/2017 0826   CREATININE 0.89 06/15/2018 1224   CREATININE 0.90 02/12/2018 0841   CREATININE 0.9 01/27/2017 0826   CALCIUM 8.9 06/15/2018 1224   CALCIUM 9.1 01/27/2017 0826   GFRNONAA >60 02/12/2018 0841   GFRAA >60 02/12/2018 0841    BNP    Component Value Date/Time   BNP 52.0 08/21/2015 1206    ProBNP No results found for: PROBNP    Assessment & Plan:   IPF (idiopathic pulmonary fibrosis) ([place]) Assessment: UIP on January/2020 CT chest Currently managed on 0FEV Currently managed on 15 mg of prednisone daily Patient feels  that shortness of breath has been waxing and waning. Contacted patient spouse over the phone who reports that she feels that patient's shortness of breath has worsened over the last 2 weeks, she believes this may be attributed to the heat Patient tolerated walk in our office today on room air, starting oxygen saturations 100%, on third lab oxygen saturations dropped to 92% Breath sounds today have an expiratory wheeze throughout exam, bibasilar crackles Chest x-ray in office today shows no acute finding, chronic fibrosis  Plan: Prednisone taper today, extended, then restart 69m prednisone daily  Continue OFEV 2 to 4-week follow-up with our office, patient on list to see Dr. RChase Calleras soon as July/2020 clinic opens up Repeat high-resolution CT chest in September/2020, if symptoms are not improving may need to consider repeating high-resolution CT chest sooner    Return in about 2 weeks (around 12/17/2018), or if symptoms worsen or fail to improve, for Follow up with Dr. RPurnell Shoemaker   BLauraine Rinne NP 12/03/2018   This appointment was 30 minutes long with over 50% of the time in direct face-to-face patient care, assessment, plan of care, and follow-up.

## 2018-12-03 NOTE — Patient Instructions (Addendum)
Walk today in office he tolerated well  Chest x-ray today shows stable chronic interstitial lung disease, no acute findings  Hold daily prednisone of 15 mg while taking taper prescribed today  Prednisone 10 mg tablet Start taper as prescribed and sent to the [facility] taking 40 mg (4 tablets) daily for 5 days, then start taking 30 mg (3 tablets) daily for 5 days, then start 20 mg (2 tablets) daily for 5 days, then resume daily 15 mg dose Can start taper on 12/04/2018  Continue OFEV  We will continue to have planned high-resolution chest CT in September/2020  We will work to get you set up with Dr. Chase Caller at 1 of his July/2020 office dates.  Our office has your information to contact you as soon as he opens this clinic dates.  Return in about 2 weeks (around 12/17/2018), or if symptoms worsen or fail to improve, for Follow up with Dr. Purnell Shoemaker.   Coronavirus (COVID-19) Are you at risk?  Are you at risk for the Coronavirus (COVID-19)?  To be considered HIGH RISK for Coronavirus (COVID-19), you have to meet the following criteria:  . Traveled to [place], [place], [place], [place] or [place]; or in the [place] to [place], [place], [place], or [place]; and have fever, cough, and shortness of breath within the last 2 weeks of travel OR . Been in close contact with a person diagnosed with COVID-19 within the last 2 weeks and have fever, cough, and shortness of breath . IF YOU DO NOT MEET THESE CRITERIA, YOU ARE CONSIDERED LOW RISK FOR COVID-19.  What to do if you are HIGH RISK for COVID-19?  Marland Kitchen If you are having a medical emergency, call 911. . Seek medical care right away. Before you go to a doctor's office, urgent care or emergency department, call ahead and tell them about your recent travel, contact with someone diagnosed with COVID-19, and your symptoms. You should receive instructions from your physician's office regarding next steps of care.  . When you arrive at  healthcare provider, tell the healthcare staff immediately you have returned from visiting [place], [place], [place], [place] or [place]; or traveled in the [place] to [place], [place], [place], or [place]; in the last two weeks or you have been in close contact with a person diagnosed with COVID-19 in the last 2 weeks.   . Tell the health care staff about your symptoms: fever, cough and shortness of breath. . After you have been seen by a medical provider, you will be either: o Tested for (COVID-19) and discharged home on quarantine except to seek medical care if symptoms worsen, and asked to  - Stay home and avoid contact with others until you get your results (4-5 days)  - Avoid travel on public transportation if possible (such as bus, train, or airplane) or o Sent to the Emergency Department by EMS for evaluation, COVID-19 testing, and possible admission depending on your condition and test results.  What to do if you are LOW RISK for COVID-19?  Reduce your risk of any infection by using the same precautions used for avoiding the common cold or flu:  Marland Kitchen Wash your hands often with soap and warm water for at least 20 seconds.  If soap and water are not readily available, use an alcohol-based hand sanitizer with at least 60% alcohol.  . If coughing or sneezing, cover your mouth and nose by coughing or sneezing into the elbow areas of  your shirt or coat, into a tissue or into your sleeve (not your hands). . Avoid shaking hands with others and consider head nods or verbal greetings only. . Avoid touching your eyes, nose, or mouth with unwashed hands.  . Avoid close contact with people who are sick. . Avoid places or events with large numbers of people in one location, like concerts or sporting events. . Carefully consider travel plans you have or are making. . If you are planning any travel outside or inside the [place], visit the CDC's Travelers' Health webpage for the latest health  notices. . If you have some symptoms but not all symptoms, continue to monitor at home and seek medical attention if your symptoms worsen. . If you are having a medical emergency, call 911.   [place] / e-Visit: [email or web address]         [facility]: [facility]: [phone]                   [facility]: [phone]           It is flu season:   >>> Best ways to protect herself from the flu: Receive the yearly flu vaccine, practice good hand hygiene washing with soap and also using hand sanitizer when available, eat a nutritious meals, get adequate rest, hydrate appropriately   Please contact the office if your symptoms worsen or you have concerns that you are not improving.   Thank you for choosing [facility] for your healthcare, and for allowing [place] to partner with you on your healthcare journey. I am thankful to be able to provide care to you today.   Wyn Quaker FNP-C

## 2018-12-03 NOTE — Progress Notes (Signed)
Discussed results with patient in office.  Nothing further is needed at this time.  Brian Mack FNP  

## 2018-12-03 NOTE — Assessment & Plan Note (Signed)
Assessment: UIP on January/2020 CT chest Currently managed on 0FEV Currently managed on 15 mg of prednisone daily Patient feels that shortness of breath has been waxing and waning. Contacted patient spouse over the phone who reports that she feels that patient's shortness of breath has worsened over the last 2 weeks, she believes this may be attributed to the heat Patient tolerated walk in our office today on room air, starting oxygen saturations 100%, on third lab oxygen saturations dropped to 92% Breath sounds today have an expiratory wheeze throughout exam, bibasilar crackles Chest x-ray in office today shows no acute finding, chronic fibrosis  Plan: Prednisone taper today, extended, then restart 63m prednisone daily  Continue OFEV 2 to 4-week follow-up with our office, patient on list to see Dr. RChase Calleras soon as July/2020 clinic opens up Repeat high-resolution CT chest in September/2020, if symptoms are not improving may need to consider repeating high-resolution CT chest sooner

## 2018-12-07 ENCOUNTER — Telehealth (HOSPITAL_COMMUNITY): Payer: Self-pay

## 2018-12-07 NOTE — Telephone Encounter (Signed)
No response from pt, closed referral.

## 2018-12-09 ENCOUNTER — Encounter: Payer: Self-pay | Admitting: Internal Medicine

## 2018-12-09 ENCOUNTER — Other Ambulatory Visit: Payer: Self-pay

## 2018-12-09 ENCOUNTER — Ambulatory Visit (INDEPENDENT_AMBULATORY_CARE_PROVIDER_SITE_OTHER): Payer: Medicare Other | Admitting: Internal Medicine

## 2018-12-09 VITALS — BP 124/74 | HR 96 | Temp 98.1°F | Ht 66.0 in | Wt 165.8 lb

## 2018-12-09 DIAGNOSIS — J84112 Idiopathic pulmonary fibrosis: Secondary | ICD-10-CM

## 2018-12-09 DIAGNOSIS — Z5181 Encounter for therapeutic drug level monitoring: Secondary | ICD-10-CM

## 2018-12-09 DIAGNOSIS — J439 Emphysema, unspecified: Secondary | ICD-10-CM

## 2018-12-09 DIAGNOSIS — J849 Interstitial pulmonary disease, unspecified: Secondary | ICD-10-CM

## 2018-12-09 DIAGNOSIS — R5382 Chronic fatigue, unspecified: Secondary | ICD-10-CM | POA: Diagnosis not present

## 2018-12-09 DIAGNOSIS — R0602 Shortness of breath: Secondary | ICD-10-CM

## 2018-12-09 DIAGNOSIS — R140 Abdominal distension (gaseous): Secondary | ICD-10-CM

## 2018-12-09 LAB — CBC WITH DIFFERENTIAL/PLATELET
Basophils Absolute: 0.1 10*3/uL (ref 0.0–0.1)
Basophils Relative: 0.5 % (ref 0.0–3.0)
Eosinophils Absolute: 0 10*3/uL (ref 0.0–0.7)
Eosinophils Relative: 0 % (ref 0.0–5.0)
HCT: 46.5 % (ref 39.0–52.0)
Hemoglobin: 15.5 g/dL (ref 13.0–17.0)
Lymphocytes Relative: 7 % — ABNORMAL LOW (ref 12.0–46.0)
Lymphs Abs: 0.7 10*3/uL (ref 0.7–4.0)
MCHC: 33.4 g/dL (ref 30.0–36.0)
MCV: 89.4 fl (ref 78.0–100.0)
Monocytes Absolute: 0.3 10*3/uL (ref 0.1–1.0)
Monocytes Relative: 2.6 % — ABNORMAL LOW (ref 3.0–12.0)
Neutro Abs: 8.8 10*3/uL — ABNORMAL HIGH (ref 1.4–7.7)
Neutrophils Relative %: 89.9 % — ABNORMAL HIGH (ref 43.0–77.0)
Platelets: 195 10*3/uL (ref 150.0–400.0)
RBC: 5.2 Mil/uL (ref 4.22–5.81)
RDW: 15.7 % — ABNORMAL HIGH (ref 11.5–15.5)
WBC: 9.8 10*3/uL (ref 4.0–10.5)

## 2018-12-09 LAB — BASIC METABOLIC PANEL
BUN: 22 mg/dL (ref 6–23)
CO2: 26 mEq/L (ref 19–32)
Calcium: 8.9 mg/dL (ref 8.4–10.5)
Chloride: 102 mEq/L (ref 96–112)
Creatinine, Ser: 0.94 mg/dL (ref 0.40–1.50)
GFR: 76.27 mL/min (ref >60.00)
Glucose, Bld: 140 mg/dL — ABNORMAL HIGH (ref 70–99)
Potassium: 4.1 mEq/L (ref 3.5–5.1)
Sodium: 136 mEq/L (ref 135–145)

## 2018-12-09 LAB — HEPATIC FUNCTION PANEL
ALT: 20 U/L (ref 0–53)
AST: 13 U/L (ref 0–37)
Albumin: 4.1 g/dL (ref 3.5–5.2)
Alkaline Phosphatase: 65 U/L (ref 39–117)
Bilirubin, Direct: 0.1 mg/dL (ref 0.0–0.3)
Total Bilirubin: 0.5 mg/dL (ref 0.2–1.2)
Total Protein: 7.1 g/dL (ref 6.0–8.3)

## 2018-12-09 LAB — MAGNESIUM: Magnesium: 2 mg/dL (ref 1.5–2.5)

## 2018-12-09 LAB — PHOSPHORUS: Phosphorus: 3.4 mg/dL (ref 2.3–4.6)

## 2018-12-09 NOTE — Patient Instructions (Addendum)
ICD-10-CM   1. IPF (idiopathic pulmonary fibrosis) ([place])  J84.112   2. Pulmonary emphysema, unspecified emphysema type ([place])  J43.9   3. Encounter for therapeutic drug monitoring  Z51.81   4. Chronic fatigue  R53.82   5. Shortness of breath  R06.02   6. Abdominal bloating  R14.0    I think shortness of breath is multifactorial associated with lung cancer, previous radiation, IPF, elevated pulmonary systolic pressure and diastolic dysfunction.   On walk test no indication IPF worse compared to last month You could be havign new side effects related to ofev  Plan  - check cbc, bmet, lft, mag, phos 12/09/2018 - do HRCT supine and prone next few to several weeks - hold ofev for 1 week then - take 1 tablet daily for 1 week and then take full dose at 1 tablet twice daily  Followup 4 weeks after CT chest -

## 2018-12-09 NOTE — Progress Notes (Signed)
IOV 12/16/2016  Chief Complaint  Patient presents with   Acute Visit    Pt c/o chest tightness, mid back pain, and right lateral rib pain when pt takes a deep breath in. Pt also c/o mild dry cough with some chest congestion. Pt denies f/c/s.     85 year old male with stage III non-small cell lung cancer and resultant radiation related pleural effusion. This supposed to go to [place] his native country in June 2018 but he canceled because of his declining health. He tells me that his functional status is pretty good. His appetite is pretty good. However he is having worsening lower thoracic back pain. He saw my colleague approximately one month ago. Chest x-ray was unchanged. He was advised nonsteroidal anti-inflammatory drugs and Tylenol for pain relief. But now the pain has broken through. The pain is worse. It is present in the midthoracic right infra-axillary and lower anterior chest area. It gets worse with inspiration. It is severe. He feels he might need something stronger for pain relief. There is no neurologic deficit. There is no constipation. His effort tolerance is okay with there is slight increased fatigue.   OV 03/27/2017  Chief Complaint  Patient presents with   Follow-up    Pt still has some pain in his back but it is better than it ws at last visit. Pt has noticed that he will occ. lose his voice and he has become weak, especially in the mornings. [name] states that pt has been taking some OTC for a cough and states that occ. it will be hard for him to breathe and does have occ. CP.    85 year old male with non-small cell lung cancer on observation therapy. I last saw him in July 2018. At that time he was bothered by left-sided chest pain. There was concern about cancer recurrence but a PET scan at that time showed improvement. Since then he's been on observation therapy.He now presents with his wife. He tells me that the pain is not much of an issue. He only has very  mild shortness of breath. But they both tell me that he is extremely fatigued. Particularly early in the morning. He has insomnia. Wife does think that he is deconditioned. Last lab work in August 2018 was fine. Has no symptoms of COPD exacerbation. He is up-to-date with his flu shot.he has had some headaches and neurology for that He might be depressed Walking desaturation test 185 feet 3 laps on room air: He finished it without any problems other than mild shortness of breath. Resting heart rate was 87/m. Final heart rate was 98/m. Resting pulse ox 100% final pulse ox 98%.   OV 06/29/2017  Chief Complaint  Patient presents with   Follow-up    Pt and his wife states he is doing much better since last visit. Pt has been going to PT which has helped him gain strengh back, walking better, and brething better.   He has mild emphysema and this is follow-up for that.  He is here with his wife.  They tell me that he is not taking his inhalers.  Because it really does not benefit him.  The bigger issues that have been recently dealing with other issues of fatigue and low motivation and physical deconditioning.  Therefore at last visit we put him into pulmonary rehabilitation.  We checked a lot of his blood work it ended up that his vitamin D was very low.  Put him on vitamin  D replacement.  Now he reports that with both vitamin D replacement and with pulmonary rehabilitation he has significant improvement in his fatigue and energy levels.  He feels back to baseline.  In particular tl pulmonary rehabilitation has helped him.  They both are worried about the extent of lung cancer and the odds of recurrence.  The having follow-up CT scan with Dr. Julien Nordmann in a month or 2.  They feel that if his physical condition will allow and the CT scan does not show any recurrence he would want to visit [place] in the summer 2019.  Otherwise feeling good.   OV 10/28/2017  Chief Complaint  Patient presents with   Follow-up     Pt states in the mornings it is hard to breathe. States he believes the fatigue has become better.   Jason Summers presents for follow-up with his wife.  The issues to be discussed are emphysema, shortness of breath and fatigue with vitamin D deficiency in the setting of lung cancer on observation therapy.  In addition there is travel advice coming up in 2 weeks he is going to [place] for a few months.  Overall stable.  He is no longer doing pulmonary rehab.  He uses only albuterol as needed because he does not want to do maintenance inhalers.  We told him to do his vitamin D once a month but I am not so sure as to what regimen he is following right now.  In the interim it appears that exertional dyspnea slightly worse although the fatigue component might be the same.  Wife feels is because he is anxious and because he wants to do more than his body is allowing him to do.  He does not follow the pulmonary rehabilitation home prescription accurately.  I reviewed the old chart found that Dr. Julien Nordmann has placed him on observation therapy after seeing him in March 2019.  Apparently there is a scan coming up in September 2019.  He clearly wants to be able to do more.  Walking desaturation test he got tachycardic and did not desaturate below 97%.  He is going to [place] early part of June 2019.  Is a long flight to [place].  Given the persistence of his dyspnea we did a deep dive with an objective dyspnea questionnaire.  It appears that the worst his dyspnea is level 3 out of 5.  He is level 3 for walking on a level with others of his age and walking uphill, picking up and straightening.  He is a level 2 dyspnea walking upstairs and washing the car.  He is a level 1 dyspnea for dressing watering the lawn sexual activities.  But is a level 0 dyspnea while eating or standing up from a chair of brushing teeth or showering or shaving.  It is associated with fatigue and arthralgia     DIAGNOSIS: Stage IIIA (T3, N2,  M0) non-small cell lung cancer, poorly differentiated squamous cell carcinoma diagnosed in February 2017 with PDL 1 expression of 35%, presented with large left lower lobe lung mass in addition to left upper lobe lung nodule and mediastinal lymphadenopathy. PRIOR THERAPY: Course of concurrent chemoradiation with weekly carboplatin for AUC of 2 and paclitaxel 45 MG/M2. Status post 7 cycles, last dose was given 11/19/2015 with partial response.  CURRENT THERAPY: Observation.       OV 03/03/2018  Subjective:  Patient ID: Jason Summers, male , DOB: [birth year] , age 76 y.o. , MRN: [id number] , ADDRESS: [place] [place] [id number]   03/03/2018 -   Chief Complaint  Patient presents with   Follow-up    SOB with exertion, still on Spiriva and feels it is working      HPI [employer] 85 y.o. -follow-up for shortness of breath related to radiation fibrosis of the lung, mild emphysema and physical deconditioning.  After last visit in May 2019 we started him on Spiriva.  He says this helped only a little bit.  He still has fixed dyspnea on exertion relieved by rest.  He was in [place] and this limited his exertion at various places.  However he says overall he is stable since last visit.  He is definitely not worse.  No associated chest pain or wheezing or cough.  He had a CT scan of the chest September 2019 that I personally visualized and agree with the report that this shows radiation related changes but no active cancer.  Also for the last few months he is on and off hoarseness of voice with variable tone in his voice.  Wife thinks is because of allergies.  Wife feels that patient has fixed issues and needs to accept his quality of life and status and health but patient feels that he constantly wants to get better.  No fever      OV 06/15/2018  Subjective:  Patient ID: Jason Summers, male , DOB: [birth year] , age 59 y.o. , MRN: [id number] , ADDRESS: [place]  [id number]   06/15/2018 -   Chief Complaint  Patient presents with   Follow-up    Since the dx of lung cancer 3 years ago, pt has has had more problems with his breathing and has to stop multiple times while walking to catch his breath. Pt has an occ cough but states he has been having a lot of headaches and fatigue. Pt has also been having problems with shaking and also unable to talk to people due the SOB.     HPI Jason Summers 85 y.o. -presents acutely with his wife.  Since I last saw him in September 2019 he has had worsening shortness of breath.  This is to the point now that when he climbs a flight of stairs that he is panting quite a bit.  He also has to stop while he climbs.  Normal exertion is now becoming difficult.  It takes a long time for him to recover to the point that he is not able to talk immediately after exertion.  At rest he feels fine.  The wife also states is significantly depressed.  He is asking for himself to be checked into a nursing home which the wife is reluctant to do.  He is having nonspecific headaches that are chronic in nature.  But when he bends his head down it gets worse.  They believe in the last few months this is also worse.  There is no chest pain or wheezing or pedal edema or hemoptysis or weight loss.  OV 06/25/2018  Subjective:  Patient ID: Jason Summers, male , DOB: 1932/03/20 , age 45 y.o. , MRN: [id number] , ADDRESS: [place] [facility] [id number]   06/25/2018 -   Chief Complaint  Patient presents with   Follow-up    Pt states he is about the same as last visit. SOB is about the same and pt is still fatigued.     HPI Faaris Pillars 85 y.o. -presents for follow-up after work-up.  He had CT chest  June 22, 2018 that I personally visualized.  The radiologist reporting presence of UIP with honeycombing.  I visualized this and agree with the findings.  He had echocardiogram June 22, 2018 that shows grade 1 diastolic dysfunction and slightly  elevated pulmonary artery systolic pressure with ejection fraction 55-60%.  In retrospect his CT chest from early 2019 shows ILD findings.  This corresponds with the time that he has been getting worse.  Walking desaturation test still shows adequate oxygenation but tendency to drop.  Compared to his last visit there are no new interim problems.  He is not interested in doing pulmonary rehabilitation.  At this point in time discussion focused on IPF diagnosis, prognosis and treatment.  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  He smoked in the remote past.  He did get chemo and radiation for his lung few to several years ago    OV 08/12/2018  Subjective:  Patient ID: Jason Summers, male , DOB: 1931/06/10 , age 75 y.o. , MRN: [id number] , ADDRESS: [place] [id number]   08/12/2018 -   Chief Complaint  Patient presents with   Follow-up    PFT attempted but pt was unable to complete. Pt states breathing has become worse since last visit. States he was wearing O2 at night but then began to become choked from it and had to stop wearing it. Pt also attempted to wear the O2 during the day as needed and had same problems with it. Pt also has had some chest tightness when he is unable to get his breath. Denies any complaints of cough.     HPI Jason Summers 85 y.o. -presents 5-year follow-up.  He presents with his wife.  He is now been on nintedanib since late January 2020.  He says he is tolerating it well although he has had several pound weight loss.  But other than this is not having any side effects from the nintedanib.  He was started on nighttime oxygen but he says he does not want to use it because the oxygen tubing is choking him.  His wife and he are very concerned about the level of shortness of breath.  They feel there is a decline in the last 1 year which we do know is from progressive ILD.  They do agree that it does not decline since early 2020/January 2020.  Nevertheless the symptom  burden is very high and this is documented below.  Wife states that he is very despondent and anxious and apparently this is his baseline.  He is also less active.  Wife states that he is always focused on the shortness of breath.  Multiple different questions about how to get better and move forward.  Questions about IPF prognosis.  Also with the upcoming pandemic of the coronavirus-asking for travel advice.  They do have family visiting from [place] and [place] by air.  His walking desaturation test today shows stability compared to the previous visit.   OV 12/09/2018  Subjective:  Patient ID: Jason Summers, male , DOB: [birth year] , age 14 y.o. , MRN: [id number] , ADDRESS: [id number] [place] [facility] [id number]   12/09/2018 -   Chief Complaint  Patient presents with   Follow-up   IPF follow-up.  On nintedanib since late January 2020  HPI Jason Summers 85 y.o. -returns for follow-up with his wife.  Last seen 3 pandemic.  Since then his weight has been stable.  He was initially losing weight on  nintedanib.  Initially they told me that he has no side effects with nintedanib but then as I question further they tell me that for the last 1 month he has had abdominal bloating a few episodes of diarrhea some cramping all of moderate intensity.  But the bigger complaint is progressive shortness of breath despite nintedanib.  He cannot do pulmonary function test because of technical issues.  His last CT scan was in January 2020 that showed progression.  He and his wife are asking for a repeat CT scan of the chest.  He has been cleared for follow-up from a oncology perspective for his lung cancer from Dr. Julien Nordmann.  But apparently has a CT scans every 6 months.  His last echo was in January 2020 that showed diastolic dysfunction and elevated pulmonary systolic pressure he is heavily burdened by shortness of breath.  So far we have not been able to fix it or even improve upon it.  It is deteriorating.     SYMPTOM  SCALE - ILD 08/12/2018 Weight 165# 12/09/2018 165#  O2 use ra ra  Shortness of Breath 0 -> 5 scale with 5 being worst (score 6 If unable to do)   At rest 3   Simple tasks - showers, clothes change, eating, shaving 4 4  Household (dishes, doing bed, laundry) X -does not do. x  Shopping 3 x  Walking level at own pace 4   Walking keeping up with others of same age 80 5  Walking up Stairs 3 5  Walking up [place] 4 5  Total (40 - 48) Dyspnea Score 25   How bad is your cough? 0   How bad is your fatigue 4        Simple office walk 185 feet x  3 laps goal with forehead probe 10/28/2017  06/15/2018 [place] - 250 feet x 3 laps 06/25/2018 Weight 171# 08/12/2018 ofev x 4-6 weeks. Unable to do PFT - weight 165# 12/09/2018   O2 used Room  Room air Room air Room air Room air  Number laps completed _0 las x 25o feet 3 3  Comments about pace Good pace normal normal normal normal  Resting Pulse Ox/HR 100% and 83/min 97% and 87/min 98% and 96/min 99% and 83/min 99% and 96/min  Final Pulse Ox/HR 97% and 96/min 91% and 101/mikn 90% and 110/min 91% and 102/min 92% and 116/min  Desaturated </= 88% _1   Desaturated <= 3% points yds Yes, 6 points Yes, 8 points Yes, 8 points Yes, 7 points  Got Tachycardic >/= 90/min _2   Symptoms at end of test x Moderate dyspnea Mild dysnea Mild dyspnea Very dyspneic at last lap  Miscellaneous comments x ? worse  Not using night o2 Not using o2     ROS - per HPI     has a past medical history of Arthritis, Cancer ([place]), Hemorrhoids, Lateral epicondylitis (tennis elbow), Non-small cell lung cancer (NSCLC) ([place]) (dx'f 07/30/15), Odynophagia (11/06/2015), and Spondylosis of cervical joint.   reports that he quit smoking about 7 years ago. His smoking use included cigarettes. He has a 12.40 pack-year smoking history. He has never used smokeless tobacco.  Past Surgical History:  Procedure Laterality Date   COLONOSCOPY  2003   KNEE ARTHROSCOPY   2006   right    Allergies  Allergen Reactions   Gentamicin Other (See Comments)    Made eyes red, swollen, hot feeling  Loteprednol-Tobramycin Other (See Comments)    Other reaction(s): Other (See Comments) Made eyes Red, Swollen, Red, Warm feeling  Other reaction(s): Other (See Comments) Made eyes Red, Swollen, Red, Warm feeling  Made eyes Red, Swollen, Red, Warm feeling     Immunization History  Administered Date(s) Administered   Influenza Split 03/19/2011, 03/12/2012   Influenza Whole 06/09/1997, 04/22/2007, 03/14/2008, 03/28/2009, 05/09/2010   Influenza, High Dose Seasonal PF 04/14/2013, 04/11/2015, 03/02/2017, 03/23/2018   Influenza,inj,Quad PF,6+ Mos 05/07/2016   Influenza-Unspecified 04/10/2014   Pneumococcal Conjugate-13 10/14/2013   Pneumococcal Polysaccharide-23 06/09/2004, 10/04/2012   Td 06/09/2005   Zoster 02/04/2015    Family History  Problem Relation Age of Onset   Hypertension Mother    Heart disease Mother    Coronary artery disease Other      Current Outpatient Medications:    albuterol (PROVENTIL HFA;VENTOLIN HFA) 108 (90 Base) MCG/ACT inhaler, Inhale 2 puffs into the lungs every 6 (six) hours as needed for wheezing or shortness of breath., Disp: 1 Inhaler, Rfl: 6   calcium carbonate (TUMS - DOSED IN MG ELEMENTAL CALCIUM) 500 MG chewable tablet, Chew 1 tablet by mouth 2 (two) times daily., Disp: , Rfl:    glucosamine-chondroitin 500-400 MG tablet, Take 1 tablet by mouth daily. Reported on 09/11/2015, Disp: , Rfl:    OFEV 150 MG CAPS, Take 1 tablet by mouth 2 (two) times daily., Disp: , Rfl:    pantoprazole (PROTONIX) 40 MG tablet, Take 1 tablet (40 mg total) by mouth daily., Disp: 30 tablet, Rfl: 3   predniSONE (DELTASONE) 10 MG tablet, Take 1.5 tablets (15 mg total) by mouth daily with breakfast., Disp: 44 tablet, Rfl: 1   predniSONE (DELTASONE) 10 MG tablet, Take 4 tablets (40 mg total) by mouth daily with breakfast for 5 days,  THEN 3 tablets (30 mg total) daily with breakfast for 5 days, THEN 2 tablets (20 mg total) daily with breakfast for 5 days., Disp: 50 tablet, Rfl: 0   tamsulosin (FLOMAX) 0.4 MG CAPS capsule, Take 1 capsule (0.4 mg total) by mouth daily., Disp: 30 capsule, Rfl: 3   traZODone (DESYREL) 50 MG tablet, TAKE 2 TABLETS BY MOUTH AT BEDTIME, Disp: 180 tablet, Rfl: 0   umeclidinium bromide (INCRUSE ELLIPTA) 62.5 MCG/INH AEPB, Inhale 1 puff into the lungs daily., Disp: 1 each, Rfl: 6   umeclidinium bromide (INCRUSE ELLIPTA) 62.5 MCG/INH AEPB, Inhale 1 puff into the lungs daily., Disp: 1 each, Rfl: 0      Objective:   Vitals:   12/09/18 1430  BP: 124/74  Pulse: 96  Temp: 98.1 F (36.7 C)  TempSrc: Oral  SpO2: 94%  Weight: 165 lb 12.8 oz (75.2 kg)  Height: 5' 6" (1.676 m)    Estimated body mass index is 26.76 kg/m as calculated from the following:   Height as of this encounter: 5' 6" (1.676 m).   Weight as of this encounter: 165 lb 12.8 oz (75.2 kg).  _0 @  [employer]   12/09/18 1430  Weight: 165 lb 12.8 oz (75.2 kg)     Physical Exam  General Appearance:    Alert, cooperative, no distress, appears stated age - yes , Deconditioned looking - yes , OBESE  - no, Sitting on Wheelchair -  no  Head:    Normocephalic, without obvious abnormality, atraumatic  Eyes:    PERRL, conjunctiva/corneas clear,  Ears:    Normal TM's and external ear canals, both ears  Nose:   Nares normal, septum midline, mucosa normal, no  drainage    or sinus tenderness. OXYGEN ON  - no . Patient is @ ra   Throat:   Lips, mucosa, and tongue normal; teeth and gums normal. Cyanosis on lips - no  Neck:   Supple, symmetrical, trachea midline, no adenopathy;    thyroid:  no enlargement/tenderness/nodules; no carotid   bruit or JVD  Back:     Symmetric, no curvature, ROM normal, no CVA tenderness  Lungs:     Distress - no , Wheeze no, Barrell Chest - no, Purse lip breathing - no, Crackles - yes   Chest  Wall:    No tenderness or deformity.    Heart:    Regular rate and rhythm, S1 and S2 normal, no rub   or gallop, Murmur - no  Breast Exam:    NOT DONE  Abdomen:     Soft, non-tender, bowel sounds active all four quadrants,    no masses, no organomegaly. Visceral obesity - mild  Genitalia:   NOT DONE  Rectal:   NOT DONE  Extremities:   Extremities - normal, Has Cane - no, Clubbing - no, Edema - no  Pulses:   2+ and symmetric all extremities  Skin:   Stigmata of Connective Tissue Disease - no  Lymph nodes:   Cervical, supraclavicular, and axillary nodes normal  Psychiatric:  Neurologic:   Pleasant - yes, Anxious - yes, Flat affect - yes  CAm-ICU - neg, Alert and Oriented x 3 - yes, Moves all 4s - yes, Speech - normal, Cognition - intact           Assessment:       ICD-10-CM   1. IPF (idiopathic pulmonary fibrosis) (HCC)  J84.112 CBC with Differential/Platelet    Basic metabolic panel    Hepatic function panel    Magnesium    Phosphorus  2. Pulmonary emphysema, unspecified emphysema type ([place])  J43.9   3. Encounter for therapeutic drug monitoring  Z51.81 Hepatic function panel  4. Chronic fatigue  R53.82   5. Shortness of breath  R06.02   6. Abdominal bloating  R14.0   7. Interstitial pulmonary disease ([place])  J84.9 CT Chest High Resolution       Plan:     Patient Instructions     ICD-10-CM   1. IPF (idiopathic pulmonary fibrosis) ([place])  J84.112   2. Pulmonary emphysema, unspecified emphysema type ([place])  J43.9   3. Encounter for therapeutic drug monitoring  Z51.81   4. Chronic fatigue  R53.82   5. Shortness of breath  R06.02   6. Abdominal bloating  R14.0    I think shortness of breath is multifactorial associated with lung cancer, previous radiation, IPF, elevated pulmonary systolic pressure and diastolic dysfunction.   On walk test no indication IPF worse compared to last month You could be havign new side effects related to ofev  Plan  - check cbc, bmet, lft,  mag, phos 12/09/2018 - do HRCT supine and prone next few to several weeks - hold ofev for 1 week then - take 1 tablet daily for 1 week and then take full dose at 1 tablet twice daily  Followup 4 weeks after CT chest -  > 50% of this > 25 min visit spent in face to face counseling or coordination of care - by this undersigned MD - Dr Brand Males. This includes one or more of the following documented above: discussion of test results, diagnostic or treatment recommendations, prognosis, risks and benefits of management options,  instructions, education, compliance or risk-factor reduction    SIGNATURE    Dr. Brand Males, M.D., F.C.C.P,  Pulmonary and Critical Care Medicine Staff Physician, [place] Director - Interstitial Lung Disease  Program  Pulmonary [place] [place], [place], [address]  Pager: [phone], If no answer or between  15:00h - 7:00h: call [phone] Telephone: [phone]  3:21 PM 12/09/2018

## 2018-12-15 ENCOUNTER — Ambulatory Visit: Payer: Medicare Other | Admitting: Pulmonary Disease

## 2018-12-31 ENCOUNTER — Other Ambulatory Visit: Payer: Self-pay | Admitting: Adult Health

## 2018-12-31 DIAGNOSIS — F419 Anxiety disorder, unspecified: Secondary | ICD-10-CM

## 2018-12-31 NOTE — Telephone Encounter (Signed)
Sent to the pharmacy by e-scribe. 

## 2019-01-10 ENCOUNTER — Telehealth: Payer: Self-pay | Admitting: Adult Health

## 2019-01-10 DIAGNOSIS — K219 Gastro-esophageal reflux disease without esophagitis: Secondary | ICD-10-CM

## 2019-01-10 NOTE — Telephone Encounter (Signed)
Medication Refill - Medication: Nexium 40 mg (Pharmacy has tried multiple times reaching out to office  Has the patient contacted their pharmacy? Yes (Agent: If no, request that the patient contact the pharmacy for the refill.) (Agent: If yes, when and what did the pharmacy advise?)Contact PCP  Preferred Pharmacy (with phone number or street name):  [facility] [place], [place] [phone] (Phone) [phone] (Fax)     Agent: Please be advised that RX refills may take up to 3 business days. We ask that you follow-up with your pharmacy.

## 2019-01-11 MED ORDER — PANTOPRAZOLE SODIUM 40 MG PO TBEC: 40.0000 mg | DELAYED_RELEASE_TABLET | Freq: Every day | ORAL | 0 refills | Status: DC

## 2019-01-11 NOTE — Telephone Encounter (Signed)
Pt is no longer taking Nexium.  Changed to pantoprazole on 10/05/2018.  Pantoprazole sent to the pharmacy by e-scribe for 90 days.  Nothing further needed.

## 2019-01-12 ENCOUNTER — Inpatient Hospital Stay: Admission: RE | Admit: 2019-01-12 | Payer: Medicare Other | Source: Ambulatory Visit

## 2019-01-13 ENCOUNTER — Other Ambulatory Visit: Payer: Self-pay

## 2019-01-13 ENCOUNTER — Ambulatory Visit (INDEPENDENT_AMBULATORY_CARE_PROVIDER_SITE_OTHER)
Admission: RE | Admit: 2019-01-13 | Discharge: 2019-01-13 | Disposition: A | Discharge status: Home / Self Care | Payer: Medicare Other | Source: Ambulatory Visit | Attending: Internal Medicine | Admitting: Internal Medicine

## 2019-01-13 ENCOUNTER — Telehealth (INDEPENDENT_AMBULATORY_CARE_PROVIDER_SITE_OTHER): Payer: Medicare Other | Admitting: Adult Health

## 2019-01-13 DIAGNOSIS — R4189 Other symptoms and signs involving cognitive functions and awareness: Secondary | ICD-10-CM | POA: Diagnosis not present

## 2019-01-13 DIAGNOSIS — J849 Interstitial pulmonary disease, unspecified: Secondary | ICD-10-CM | POA: Diagnosis not present

## 2019-01-13 NOTE — Progress Notes (Signed)
Virtual Visit via Telephone Note  I connected with Jason Summers on 01/13/19 at  2:30 PM EDT by telephone and verified that I am speaking with the correct person using two identifiers.   I discussed the limitations, risks, security and privacy concerns of performing an evaluation and management service by telephone and the availability of in person appointments. I also discussed with the patient that there may be a patient responsible charge related to this service. The patient expressed understanding and agreed to proceed.  Location patient: home Location provider: work or home office Participants present for the call: patient, provider, wife Patient did not have a visit in the prior 7 days to address this/these issue(s).   History of Present Illness: 85 year old male who is being evaluated today for worsening cognitive impairment.  Is been suffering from mild cognitive impairment for many years but his wife feels as though over the last few months his cognitive impairment has become worse.  Yesterday he was to have a CT scan of the chest and got lost going to the imaging facility and ended up on the wrong side of town.  His wife reports that this is the first time that he is gotten lost while driving by himself but that she usually drives with him and tells him more to go.    He has not had any falls and he does not seem to be off balance.  Denies UTI symptoms, fevers, chills, or chest pain.  MRI of Brain in 2018 showed   IMPRESSION: Stable compared to prior.  Punctate focus of enhancement in the left parietal occipital white matter is stable at nearly 1 year. This could reflect a small vascular structure or metastasis successfully treated with systemic therapy.   Observations/Objective: Patient sounds cheerful and well on the phone. I do not appreciate any SOB. Speech and thought processing are grossly intact. Patient reported vitals:  Assessment and Plan: 1. Cognitive  impairment -We will have him come in tomorrow for labs.   -Likely repeat MRI of the brain and refer to neurology for further evaluation - CBC with Differential/Platelet; Future - TSH; Future - Vitamin B12; Future - POC Urinalysis Dipstick; Future   Follow Up Instructions:   I did not refer this patient for an OV in the next 24 hours for this/these issue(s).  I discussed the assessment and treatment plan with the patient. The patient was provided an opportunity to ask questions and all were answered. The patient agreed with the plan and demonstrated an understanding of the instructions.   The patient was advised to call back or seek an in-person evaluation if the symptoms worsen or if the condition fails to improve as anticipated.  I provided 20 minutes of non-face-to-face time during this encounter.   [name], NP

## 2019-01-14 ENCOUNTER — Other Ambulatory Visit (INDEPENDENT_AMBULATORY_CARE_PROVIDER_SITE_OTHER): Payer: Medicare Other

## 2019-01-14 DIAGNOSIS — R4189 Other symptoms and signs involving cognitive functions and awareness: Secondary | ICD-10-CM

## 2019-01-14 LAB — CBC WITH DIFFERENTIAL/PLATELET
Basophils Absolute: 0 10*3/uL (ref 0.0–0.1)
Basophils Relative: 0.5 % (ref 0.0–3.0)
Eosinophils Absolute: 0.1 10*3/uL (ref 0.0–0.7)
Eosinophils Relative: 1.4 % (ref 0.0–5.0)
HCT: 42.8 % (ref 39.0–52.0)
Hemoglobin: 14.4 g/dL (ref 13.0–17.0)
Lymphocytes Relative: 14.1 % (ref 12.0–46.0)
Lymphs Abs: 1.2 10*3/uL (ref 0.7–4.0)
MCHC: 33.5 g/dL (ref 30.0–36.0)
MCV: 89.8 fl (ref 78.0–100.0)
Monocytes Absolute: 0.8 10*3/uL (ref 0.1–1.0)
Monocytes Relative: 9.6 % (ref 3.0–12.0)
Neutro Abs: 6.3 10*3/uL (ref 1.4–7.7)
Neutrophils Relative %: 74.4 % (ref 43.0–77.0)
Platelets: 175 10*3/uL (ref 150.0–400.0)
RBC: 4.77 Mil/uL (ref 4.22–5.81)
RDW: 14.9 % (ref 11.5–15.5)
WBC: 8.5 10*3/uL (ref 4.0–10.5)

## 2019-01-14 LAB — VITAMIN B12: Vitamin B-12: 238 pg/mL (ref 211–911)

## 2019-01-14 LAB — POCT URINALYSIS DIPSTICK
Bilirubin, UA: NEGATIVE
Blood, UA: NEGATIVE
Glucose, UA: NEGATIVE
Ketones, UA: NEGATIVE
Nitrite, UA: NEGATIVE
Odor: NEGATIVE
Protein, UA: NEGATIVE
Spec Grav, UA: 1.025 (ref 1.010–1.025)
Urobilinogen, UA: 0.2 E.U./dL
pH, UA: 6 (ref 5.0–8.0)

## 2019-01-14 LAB — TSH: TSH: 2.09 u[IU]/mL (ref 0.35–4.50)

## 2019-01-16 LAB — URINE CULTURE
MICRO NUMBER:: 749428
SPECIMEN QUALITY:: ADEQUATE

## 2019-01-17 ENCOUNTER — Telehealth: Payer: Self-pay | Admitting: Internal Medicine

## 2019-01-17 NOTE — Telephone Encounter (Signed)
MR please advise on CT results.

## 2019-01-18 ENCOUNTER — Other Ambulatory Visit: Payer: Self-pay | Admitting: Adult Health

## 2019-01-18 ENCOUNTER — Telehealth: Payer: Self-pay | Admitting: Family Medicine

## 2019-01-18 DIAGNOSIS — Z0000 Encounter for general adult medical examination without abnormal findings: Secondary | ICD-10-CM

## 2019-01-18 DIAGNOSIS — K219 Gastro-esophageal reflux disease without esophagitis: Secondary | ICD-10-CM

## 2019-01-18 MED ORDER — ESOMEPRAZOLE MAGNESIUM 40 MG PO CPDR: 40.0000 mg | DELAYED_RELEASE_CAPSULE | Freq: Every day | ORAL | 3 refills | Status: DC

## 2019-01-18 MED ORDER — CIPROFLOXACIN HCL 500 MG PO TABS: 500.0000 mg | ORAL_TABLET | Freq: Two times a day (BID) | ORAL | 0 refills | Status: AC

## 2019-01-18 MED ORDER — ESOMEPRAZOLE MAGNESIUM 40 MG PO CPDR: 40.0000 mg | DELAYED_RELEASE_CAPSULE | Freq: Every day | ORAL | 0 refills | Status: DC

## 2019-01-18 NOTE — Telephone Encounter (Signed)
Patient wife is calling back CB is [phone]

## 2019-01-18 NOTE — Telephone Encounter (Signed)
Called and spoke with patient's wife. Let her know Dr. Chase Caller wasn't in the office but was sent a reminder to look at the CT. I let her know when he resulted the CT to [place] we would call her will leave in triage until hear from MR

## 2019-01-18 NOTE — Telephone Encounter (Signed)
Advised pt's wife  of results. She understood and nothing further is needed.

## 2019-01-18 NOTE — Addendum Note (Signed)
Addended by: Miles Costain T on: 01/18/2019 05:05 PM   Modules accepted: Orders

## 2019-01-18 NOTE — Telephone Encounter (Signed)
No change in fibrosis Jan 2020 -> Aug 2020 . No other changes. No new fidnings. There is an old scar tissue in mid spine vertebrae - if this is scar or reflective of lung cancer- > they want to check with [name] or PCP [name], NP   I can do virstual or real visit in Augu 2020   IMPRESSION: 1. No significant interval change in moderate to severe pulmonary fibrosis in a pattern with apical to basal gradient featuring traction bronchiectasis, peripheral bronchiolectasis and honeycombing, most notable in the right lung base. Although not significantly changed when compared to immediate prior examination, findings are substantially and rapidly worsened on sequential examinations over time dating back to 12/31/2015. Findings remain in a UIP pattern and consistent with clinical diagnosis of IPF.  2. No change in post treatment appearance of the left lower lobe. Unchanged small, loculated left pleural effusion.  3. Redemonstrated sclerotic wedge deformity of T6, which remains generally suspicious for osseous metastasis.  4. Coronary artery disease, aortic atherosclerosis, and aortic valve calcifications.   Electronically Signed   By: Eddie Candle M.D.   On: 01/13/2019 09:50

## 2019-01-18 NOTE — Telephone Encounter (Signed)
Spoke to [name].  She informed me that she would like for Jason Summers Nexium to be refilled.  Advised her that he is no longer taking that (see office note from 10/05/2018) and that I could refill the pantoprazole.  She said the pantoprazole did not work so Jason Summers switched himself back to the Nexium.  He is now completely out of the medication and has been using [name] supply.  Please advise.

## 2019-01-18 NOTE — Telephone Encounter (Signed)
Ok to d./c protonix and fill nexium for 90 +3

## 2019-01-18 NOTE — Telephone Encounter (Signed)
Jason Summers notified that prescription has been sent in.  A 1 year supply was sent to [facility] and a 2 week supply sent to the local pharmacy.  Nothing further needed.

## 2019-01-18 NOTE — Telephone Encounter (Signed)
Patient wife called back to say that she had a missed call from earlier in the day. She would like a call back with Lab results now that they are back in the house. Please advise

## 2019-01-19 NOTE — Telephone Encounter (Signed)
01/19/2019 1011  Routing to PR to see if patient can be scheduled with Dr. Chase Caller as he has known ILD and Dr. Chase Caller wanted to do a office visit in August/2020.  Wyn Quaker, FNP

## 2019-01-20 NOTE — Telephone Encounter (Signed)
Placed patient on my list to call when MR opens clinic-pr

## 2019-01-21 ENCOUNTER — Telehealth: Payer: Self-pay | Admitting: Internal Medicine

## 2019-01-21 NOTE — Telephone Encounter (Signed)
Spoke with pt's wife and she would like to hear from MR as soon as he talks to Dr. Earlie Server. MR when were you planning to talk to him? Please advise.

## 2019-01-24 ENCOUNTER — Telehealth: Payer: Self-pay | Admitting: Internal Medicine

## 2019-01-24 NOTE — Telephone Encounter (Signed)
This is a part of a previous phone message. Wife called Friday to discuss CT scan. No reply from Dr. Chase Caller yet. Patient made appt with MR on Wednesday 01/26/19 and will talk with him during this appointment.  Wife needs to come into visit with husband due to memory and hearing problems.   Nothing further needed at this time.

## 2019-01-25 NOTE — Telephone Encounter (Signed)
lmtcb for pt.

## 2019-01-25 NOTE — Telephone Encounter (Signed)
Spoke to Dr Earlie Server - he recommends getting a Bone Scan (not PET scan) . Please order

## 2019-01-25 NOTE — Telephone Encounter (Signed)
MR please advise if this is a 'whole body' bone scan? Or something else.  Thanks.

## 2019-01-25 NOTE — Telephone Encounter (Signed)
Whole body bone scan

## 2019-01-26 ENCOUNTER — Other Ambulatory Visit: Payer: Self-pay

## 2019-01-26 ENCOUNTER — Encounter: Payer: Self-pay | Admitting: Internal Medicine

## 2019-01-26 ENCOUNTER — Ambulatory Visit (INDEPENDENT_AMBULATORY_CARE_PROVIDER_SITE_OTHER): Payer: Medicare Other | Admitting: Internal Medicine

## 2019-01-26 VITALS — BP 126/70 | HR 102 | Temp 98.8°F | Ht 66.0 in | Wt 168.4 lb

## 2019-01-26 DIAGNOSIS — C3492 Malignant neoplasm of unspecified part of left bronchus or lung: Secondary | ICD-10-CM

## 2019-01-26 DIAGNOSIS — J439 Emphysema, unspecified: Secondary | ICD-10-CM | POA: Diagnosis not present

## 2019-01-26 DIAGNOSIS — J84112 Idiopathic pulmonary fibrosis: Secondary | ICD-10-CM

## 2019-01-26 DIAGNOSIS — Z5181 Encounter for therapeutic drug level monitoring: Secondary | ICD-10-CM

## 2019-01-26 DIAGNOSIS — R0602 Shortness of breath: Secondary | ICD-10-CM | POA: Diagnosis not present

## 2019-01-26 DIAGNOSIS — I2510 Atherosclerotic heart disease of native coronary artery without angina pectoris: Secondary | ICD-10-CM

## 2019-01-26 DIAGNOSIS — R937 Abnormal findings on diagnostic imaging of other parts of musculoskeletal system: Secondary | ICD-10-CM

## 2019-01-26 NOTE — Patient Instructions (Addendum)
IPF (idiopathic pulmonary fibrosis) ([place])  - Plan: - seems stable on CT scan jan 2020 -> aug 2020 - ofev seems to be helping - do walk test 01/26/2019   Encounter for therapeutic drug monitoring  - mild Gi symptoms with ofev for ipf. Otherwise tolerating it well   - Plan: - continue ofev with [place] as needed  Pulmonary emphysema, unspecified emphysema type ([place]) - stable   Plan: continue incruse daily with daily prednisone. Albuterol as needed  Shortness of breath - Coronary artery calcification seen on CAT scan -   Plan: refer cardiilogy for out of proportion shortness of breath and rule out angina  Abnormal computed tomography of thoracic spine  Non-small cell cancer of left lung ([place])   - Plan: do bone scan whole body (d/w [name])   Followup  - will call with results  - return in 6 weeks but after completing above

## 2019-01-26 NOTE — Progress Notes (Addendum)
IOV 12/16/2016  Chief Complaint  Patient presents with   Acute Visit    Pt c/o chest tightness, mid back pain, and right lateral rib pain when pt takes a deep breath in. Pt also c/o mild dry cough with some chest congestion. Pt denies f/c/s.     85 year old male with stage III non-small cell lung cancer and resultant radiation related pleural effusion. This supposed to go to [place] his native country in June 2018 but he canceled because of his declining health. He tells me that his functional status is pretty good. His appetite is pretty good. However he is having worsening lower thoracic back pain. He saw my colleague approximately one month ago. Chest x-ray was unchanged. He was advised nonsteroidal anti-inflammatory drugs and Tylenol for pain relief. But now the pain has broken through. The pain is worse. It is present in the midthoracic right infra-axillary and lower anterior chest area. It gets worse with inspiration. It is severe. He feels he might need something stronger for pain relief. There is no neurologic deficit. There is no constipation. His effort tolerance is okay with there is slight increased fatigue.   OV 03/27/2017  Chief Complaint  Patient presents with   Follow-up    Pt still has some pain in his back but it is better than it ws at last visit. Pt has noticed that he will occ. lose his voice and he has become weak, especially in the mornings. [name] states that pt has been taking some OTC for a cough and states that occ. it will be hard for him to breathe and does have occ. CP.    85 year old male with non-small cell lung cancer on observation therapy. I last saw him in July 2018. At that time he was bothered by left-sided chest pain. There was concern about cancer recurrence but a PET scan at that time showed improvement. Since then he's been on observation therapy.He now presents with his wife. He tells me that the pain is not much of an issue. He only  has very mild shortness of breath. But they both tell me that he is extremely fatigued. Particularly early in the morning. He has insomnia. Wife does think that he is deconditioned. Last lab work in August 2018 was fine. Has no symptoms of COPD exacerbation. He is up-to-date with his flu shot.he has had some headaches and neurology for that He might be depressed Walking desaturation test 185 feet 3 laps on room air: He finished it without any problems other than mild shortness of breath. Resting heart rate was 87/m. Final heart rate was 98/m. Resting pulse ox 100% final pulse ox 98%.   OV 06/29/2017  Chief Complaint  Patient presents with   Follow-up    Pt and his wife states he is doing much better since last visit. Pt has been going to PT which has helped him gain strengh back, walking better, and brething better.   He has mild emphysema and this is follow-up for that.  He is here with his wife.  They tell me that he is not taking his inhalers.  Because it really does not benefit him.  The bigger issues that have been recently dealing with other issues of fatigue and low motivation and physical deconditioning.  Therefore at last visit we put him into pulmonary rehabilitation.  We checked a lot of his blood work it ended up that his vitamin D was very low.  Put him on vitamin D replacement.  Now he reports that with both vitamin D replacement and with pulmonary rehabilitation he has significant improvement in his fatigue and energy levels.  He feels back to baseline.  In particular tl pulmonary rehabilitation has helped him.  They both are worried about the extent of lung cancer and the odds of recurrence.  The having follow-up CT scan with Dr. Julien Nordmann in a month or 2.  They feel that if his physical condition will allow and the CT scan does not show any recurrence he would want to visit [place] in the summer 2019.  Otherwise feeling good.   OV 10/28/2017  Chief Complaint  Patient presents with    Follow-up    Pt states in the mornings it is hard to breathe. States he believes the fatigue has become better.   Jason Summers presents for follow-up with his wife.  The issues to be discussed are emphysema, shortness of breath and fatigue with vitamin D deficiency in the setting of lung cancer on observation therapy.  In addition there is travel advice coming up in 2 weeks he is going to [place] for a few months.  Overall stable.  He is no longer doing pulmonary rehab.  He uses only albuterol as needed because he does not want to do maintenance inhalers.  We told him to do his vitamin D once a month but I am not so sure as to what regimen he is following right now.  In the interim it appears that exertional dyspnea slightly worse although the fatigue component might be the same.  Wife feels is because he is anxious and because he wants to do more than his body is allowing him to do.  He does not follow the pulmonary rehabilitation home prescription accurately.  I reviewed the old chart found that Dr. Julien Nordmann has placed him on observation therapy after seeing him in March 2019.  Apparently there is a scan coming up in September 2019.  He clearly wants to be able to do more.  Walking desaturation test he got tachycardic and did not desaturate below 97%.  He is going to [place] early part of June 2019.  Is a long flight to [place].  Given the persistence of his dyspnea we did a deep dive with an objective dyspnea questionnaire.  It appears that the worst his dyspnea is level 3 out of 5.  He is level 3 for walking on a level with others of his age and walking uphill, picking up and straightening.  He is a level 2 dyspnea walking upstairs and washing the car.  He is a level 1 dyspnea for dressing watering the lawn sexual activities.  But is a level 0 dyspnea while eating or standing up from a chair of brushing teeth or showering or shaving.  It is associated with fatigue and arthralgia     DIAGNOSIS: Stage IIIA  (T3, N2, M0) non-small cell lung cancer, poorly differentiated squamous cell carcinoma diagnosed in February 2017 with PDL 1 expression of 35%, presented with large left lower lobe lung mass in addition to left upper lobe lung nodule and mediastinal lymphadenopathy. PRIOR THERAPY: Course of concurrent chemoradiation with weekly carboplatin for AUC of 2 and paclitaxel 45 MG/M2. Status post 7 cycles, last dose was given 11/19/2015 with partial response.  CURRENT THERAPY: Observation.       OV 03/03/2018  Subjective:  Patient ID: Jason Summers, male , DOB: 25-May-1932 , age 9 y.o. , MRN: [id number] ,  ADDRESS: [place] [id number]   03/03/2018 -   Chief Complaint  Patient presents with   Follow-up    SOB with exertion, still on Spiriva and feels it is working      HPI [employer] 85 y.o. -follow-up for shortness of breath related to radiation fibrosis of the lung, mild emphysema and physical deconditioning.  After last visit in May 2019 we started him on Spiriva.  He says this helped only a little bit.  He still has fixed dyspnea on exertion relieved by rest.  He was in [place] and this limited his exertion at various places.  However he says overall he is stable since last visit.  He is definitely not worse.  No associated chest pain or wheezing or cough.  He had a CT scan of the chest September 2019 that I personally visualized and agree with the report that this shows radiation related changes but no active cancer.  Also for the last few months he is on and off hoarseness of voice with variable tone in his voice.  Wife thinks is because of allergies.  Wife feels that patient has fixed issues and needs to accept his quality of life and status and health but patient feels that he constantly wants to get better.  No fever      OV 06/15/2018  Subjective:  Patient ID: Jason Summers, male , DOB: Feb 03, 1932 , age 89 y.o. , MRN: [id number] , ADDRESS: [place]  [id number]   06/15/2018 -   Chief Complaint  Patient presents with   Follow-up    Since the dx of lung cancer 3 years ago, pt has has had more problems with his breathing and has to stop multiple times while walking to catch his breath. Pt has an occ cough but states he has been having a lot of headaches and fatigue. Pt has also been having problems with shaking and also unable to talk to people due the SOB.     HPI Kealii Boschert 85 y.o. -presents acutely with his wife.  Since I last saw him in September 2019 he has had worsening shortness of breath.  This is to the point now that when he climbs a flight of stairs that he is panting quite a bit.  He also has to stop while he climbs.  Normal exertion is now becoming difficult.  It takes a long time for him to recover to the point that he is not able to talk immediately after exertion.  At rest he feels fine.  The wife also states is significantly depressed.  He is asking for himself to be checked into a nursing home which the wife is reluctant to do.  He is having nonspecific headaches that are chronic in nature.  But when he bends his head down it gets worse.  They believe in the last few months this is also worse.  There is no chest pain or wheezing or pedal edema or hemoptysis or weight loss.  OV 06/25/2018  Subjective:  Patient ID: Jason Summers, male , DOB: [birth year] , age 71 y.o. , MRN: [id number] , ADDR[place]: [facility] [id number]   06/25/2018 -   Chief Complaint  Patient presents with   Follow-up    Pt states he is about the same as last visit. SOB is about the same and pt is still fatigued.     HPI Roma Mcgovern 85 y.o. -presents for follow-up after work-up.  He  had CT chest June 22, 2018 that I personally visualized.  The radiologist reporting presence of UIP with honeycombing.  I visualized this and agree with the findings.  He had echocardiogram June 22, 2018 that shows grade 1 diastolic dysfunction and slightly  elevated pulmonary artery systolic pressure with ejection fraction 55-60%.  In retrospect his CT chest from early 2019 shows ILD findings.  This corresponds with the time that he has been getting worse.  Walking desaturation test still shows adequate oxygenation but tendency to drop.  Compared to his last visit there are no new interim problems.  He is not interested in doing pulmonary rehabilitation.  At this point in time discussion focused on IPF diagnosis, prognosis and treatment.  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  He smoked in the remote past.  He did get chemo and radiation for his lung few to several years ago    OV 08/12/2018  Subjective:  Patient ID: Jason Summers, male , DOB: 1932/05/20 , age 68 y.o. , MRN: [id number] , ADDRESS: [place] [id number]   08/12/2018 -   Chief Complaint  Patient presents with   Follow-up    PFT attempted but pt was unable to complete. Pt states breathing has become worse since last visit. States he was wearing O2 at night but then began to become choked from it and had to stop wearing it. Pt also attempted to wear the O2 during the day as needed and had same problems with it. Pt also has had some chest tightness when he is unable to get his breath. Denies any complaints of cough.     HPI Jason Summers 85 y.o. -presents 5-year follow-up.  He presents with his wife.  He is now been on nintedanib since late January 2020.  He says he is tolerating it well although he has had several pound weight loss.  But other than this is not having any side effects from the nintedanib.  He was started on nighttime oxygen but he says he does not want to use it because the oxygen tubing is choking him.  His wife and he are very concerned about the level of shortness of breath.  They feel there is a decline in the last 1 year which we do know is from progressive ILD.  They do agree that it does not decline since early 2020/January 2020.  Nevertheless the symptom  burden is very high and this is documented below.  Wife states that he is very despondent and anxious and apparently this is his baseline.  He is also less active.  Wife states that he is always focused on the shortness of breath.  Multiple different questions about how to get better and move forward.  Questions about IPF prognosis.  Also with the upcoming pandemic of the coronavirus-asking for travel advice.  They do have family visiting from [place] and [place] by air.  His walking desaturation test today shows stability compared to the previous visit.   OV 12/09/2018  Subjective:  Patient ID: Jason Summers, male , DOB: Jul 26, 1931 , age 6 y.o. , MRN: [id number] , ADDRESS: [id number] [place] [facility] [place] [address]   12/09/2018 -   Chief Complaint  Patient presents with   Follow-up   IPF follow-up.  On nintedanib since late January 2020  HPI Jason Summers 85 y.o. -returns for follow-up with his wife.  Last seen 3 pandemic.  Since then his weight has been stable.  He was initially  losing weight on nintedanib.  Initially they told me that he has no side effects with nintedanib but then as I question further they tell me that for the last 1 month he has had abdominal bloating a few episodes of diarrhea some cramping all of moderate intensity.  But the bigger complaint is progressive shortness of breath despite nintedanib.  He cannot do pulmonary function test because of technical issues.  His last CT scan was in January 2020 that showed progression.  He and his wife are asking for a repeat CT scan of the chest.  He has been cleared for follow-up from a oncology perspective for his lung cancer from Dr. Julien Nordmann.  But apparently has a CT scans every 6 months.  His last echo was in January 2020 that showed diastolic dysfunction and elevated pulmonary systolic pressure he is heavily burdened by shortness of breath.  So far we have not been able to fix it or   OV 01/26/2019  Subjective:  Patient ID: Jason Summers,  male , DOB: Dec 21, 1931 , age 59 y.o. , MRN: [id number] , ADDRESS: [place] [id number]  IPF follow-up.  On nintedanib since late January 2020   01/26/2019 -  IPF followup after CT chest. Dyspnea worse. -Dyspnea felt to be multifactorial due to radiation related pleural effusion, lung cancer, IPF and also diastolic dysfunction associated with elevated pulmonary artery systolic pressure on echocardiogram 2020.   HPI Jason Summers 85 y.o. -presents with his wife.  Last visit was in July 2020.  He has had worsening dyspnea.  So we got a CT scan of the chest that shows stable pulmonary fibrosis compared to January 2020 but he tells me that even since July 2020 his dyspnea on exertion is worse.  He has told his wife that he is extremely worried that he will be found dead.  The CT scan also shows a T6 sclerotic lesion that and review of the previous radiology results have been present for at least over a year.  Given the redemonstration of this I discussed with his oncologist Dr. Julien Nordmann and he suggested getting a bone scan.  Patient is no longer actively following with Dr. Julien Nordmann because the cancer is believed to be under remission/control.  CT scan also the impression reported coronary artery calcifications.  Patient tells me that he has not had a cardiac stress test in over 10 years.  He does not have an active cardiologist.  Overall he is tolerating nintedanib quite well except for mild abdominal bloating and nausea which he is able to control.  He has not lost any weight.        SYMPTOM SCALE - ILD 08/12/2018 Weight 165# 12/09/2018 165#  O2 use ra ra  Shortness of Breath 0 -> 5 scale with 5 being worst (score 6 If unable to do)   At rest 3   Simple tasks - showers, clothes change, eating, shaving 4 4  Household (dishes, doing bed, laundry) X -does not do. x  Shopping 3 x  Walking level at own pace 4   Walking keeping up with others of same age 61 5  Walking up Stairs 3 5  Walking  up Hill 4 5  Total (40 - 48) Dyspnea Score 25   How bad is your cough? 0   How bad is your fatigue 4        Simple office walk 185 feet x  3 laps goal with forehead probe 10/28/2017  06/15/2018 [place] -  250 feet x 3 laps 06/25/2018 Weight 171# 08/12/2018 ofev x 4-6 weeks. Unable to do PFT - weight 165# 12/09/2018  01/26/2019 Wt 168#  O2 used Room  _0   Number laps completed _1 las x 25o feet _2 Comments about pace Good pace normal normal normal normal   Resting Pulse Ox/HR 100% and 83/min 97% and 87/min 98% and 96/min 99% and 83/min 99% and 96/min 99%  Final Pulse Ox/HR 97% and 96/min 91% and 101/mikn 90% and 110/min 91% and 102/min 92% and 116/min 90% and 117/min  Desaturated </= 88% _3    Desaturated <= 3% points yds Yes, 6 points Yes, 8 points Yes, 8 points Yes, 7 points   Got Tachycardic >/= 90/min _4    Symptoms at end of test x Moderate dyspnea Mild dysnea Mild dyspnea Very dyspneic at last lap   Miscellaneous comments x ? worse  Not using night o2 Not using o2     CT chest 01/13/2019  IMPRESSION: 1. No significant interval change in moderate to severe pulmonary fibrosis in a pattern with apical to basal gradient featuring traction bronchiectasis, peripheral bronchiolectasis and honeycombing, most notable in the right lung base. Although not significantly changed when compared to immediate prior examination, findings are substantially and rapidly worsened on sequential examinations over time dating back to 12/31/2015. Findings remain in a UIP pattern and consistent with clinical diagnosis of IPF.  2. No change in post treatment appearance of the left lower lobe. Unchanged small, loculated left pleural effusion.  3. Redemonstrated sclerotic wedge deformity of T6, which remains generally suspicious for osseous metastasis.  4. Coronary artery disease, aortic atherosclerosis, and aortic  valve calcifications.   Electronically Signed   By: Eddie Candle M.D.   On: 01/13/2019 09:50 ROS - per HPI     has a past medical history of Arthritis, Cancer ([place]), Hemorrhoids, Lateral epicondylitis (tennis elbow), Non-small cell lung cancer (NSCLC) ([facility]) (dx'f 07/30/15), Odynophagia (11/06/2015), and Spondylosis of cervical joint.   reports that he quit smoking about 7 years ago. His smoking use included cigarettes. He has a 12.40 pack-year smoking history. He has never used smokeless tobacco.  Past Surgical History:  Procedure Laterality Date   COLONOSCOPY  2003   KNEE ARTHROSCOPY  2006   right    Allergies  Allergen Reactions   Gentamicin Other (See Comments)    Made eyes red, swollen, hot feeling   Loteprednol-Tobramycin Other (See Comments)    Other reaction(s): Other (See Comments) Made eyes Red, Swollen, Red, Warm feeling  Other reaction(s): Other (See Comments) Made eyes Red, Swollen, Red, Warm feeling  Made eyes Red, Swollen, Red, Warm feeling     Immunization History  Administered Date(s) Administered   Influenza Split 03/19/2011, 03/12/2012   Influenza Whole 06/09/1997, 04/22/2007, 03/14/2008, 03/28/2009, 05/09/2010   Influenza, High Dose Seasonal PF 04/14/2013, 04/11/2015, 03/02/2017, 03/23/2018   Influenza,inj,Quad PF,6+ Mos 05/07/2016   Influenza-Unspecified 04/10/2014   Pneumococcal Conjugate-13 10/14/2013   Pneumococcal Polysaccharide-23 06/09/2004, 10/04/2012   Td 06/09/2005   Zoster 02/04/2015    Family History  Problem Relation Age of Onset   Hypertension Mother    Heart disease Mother    Coronary artery disease Other      Current Outpatient Medications:    albuterol (PROVENTIL HFA;VENTOLIN HFA) 108 (90 Base) MCG/ACT inhaler, Inhale 2 puffs into the lungs every 6 (six) hours as needed for  wheezing or shortness of breath., Disp: 1 Inhaler, Rfl: 6   Alum & Mag Hydroxide-Simeth (MYLANTA PO), Take by mouth. In liquid form  takes at night as needed., Disp: , Rfl:    calcium carbonate (TUMS - DOSED IN MG ELEMENTAL CALCIUM) 500 MG chewable tablet, Chew 1 tablet by mouth 2 (two) times daily as needed. , Disp: , Rfl:    esomeprazole (NEXIUM) 40 MG capsule, Take 1 capsule (40 mg total) by mouth daily., Disp: 90 capsule, Rfl: 3   glucosamine-chondroitin 500-400 MG tablet, Take 1 tablet by mouth daily. Reported on 09/11/2015, Disp: , Rfl:    OFEV 150 MG CAPS, Take 1 tablet by mouth 2 (two) times daily., Disp: , Rfl:    predniSONE (DELTASONE) 10 MG tablet, Take 1.5 tablets (15 mg total) by mouth daily with breakfast., Disp: 44 tablet, Rfl: 1   tamsulosin (FLOMAX) 0.4 MG CAPS capsule, Take 1 capsule (0.4 mg total) by mouth daily., Disp: 30 capsule, Rfl: 3   traZODone (DESYREL) 50 MG tablet, TAKE 2 TABLETS BY MOUTH AT BEDTIME, Disp: 180 tablet, Rfl: 0   umeclidinium bromide (INCRUSE ELLIPTA) 62.5 MCG/INH AEPB, Inhale 1 puff into the lungs daily., Disp: 1 each, Rfl: 6      Objective:   Vitals:   01/26/19 1412  BP: 126/70  Pulse: (!) 102  Temp: 98.8 F (37.1 C)  SpO2: 95%  Weight: 168 lb 6.4 oz (76.4 kg)  Height: _0  (1.676 m)    Estimated body mass index is 27.18 kg/m as calculated from the following:   Height as of this encounter: _1  (1.676 m).   Weight as of this encounter: 168 lb 6.4 oz (76.4 kg).  _2 @  [employer]   01/26/19 1412  Weight: 168 lb 6.4 oz (76.4 kg)     Physical Exam  General Appearance:    Alert, cooperative, no distress, appears stated age - yes , Deconditioned looking - mild , OBESE  - no, Sitting on Wheelchair -  no  Head:    Normocephalic, without obvious abnormality, atraumatic  Eyes:    PERRL, conjunctiva/corneas clear,  Ears:    Normal TM's and external ear canals, both ears  Nose:   Nares normal, septum midline, mucosa normal, no drainage    or sinus tenderness. OXYGEN ON  - n . Patient is @ ra   Throat:   Lips, mucosa, and tongue normal; teeth and gums  normal. Cyanosis on lips - no  Neck:   Supple, symmetrical, trachea midline, no adenopathy;    thyroid:  no enlargement/tenderness/nodules; no carotid   bruit or JVD  Back:     Symmetric, no curvature, ROM normal, no CVA tenderness  Lungs:     Distress - no , Wheeze no, Barrell Chest - no, Purse lip breathing - no, Crackles - yes at base   Chest Wall:    No tenderness or deformity.    Heart:    Regular rate and rhythm, S1 and S2 normal, no rub   or gallop, Murmur - no  Breast Exam:    NOT DONE  Abdomen:     Soft, non-tender, bowel sounds active all four quadrants,    no masses, no organomegaly. Visceral obesity - no  Genitalia:   NOT DONE  Rectal:   NOT DONE  Extremities:   Extremities - normal, Has Cane - no, Clubbing - no, Edema - no  Pulses:   2+ and symmetric all extremities  Skin:   Stigmata of  Connective Tissue Disease - no  Lymph nodes:   Cervical, supraclavicular, and axillary nodes normal  Psychiatric:  Neurologic:   Pleasant - yes, Anxious - yes, Flat affect - yes  CAm-ICU - neg, Alert and Oriented x 3 - yes, Moves all 4s - yes, Speech - normal, Cognition - intact           Assessment:       ICD-10-CM   1. IPF (idiopathic pulmonary fibrosis) ([facility])  J84.112   2. Encounter for therapeutic drug monitoring  Z51.81   3. Pulmonary emphysema, unspecified emphysema type ([place])  J43.9   4. Shortness of breath  R06.02   5. Coronary artery calcification seen on CAT scan  I25.10   6. Abnormal computed tomography of thoracic spine  R93.7   7. Non-small cell cancer of left lung ([facility])  C34.92        Plan:     Patient Instructions  IPF (idiopathic pulmonary fibrosis) ([place])  - Plan: - seems stable on CT scan jan 2020 -> aug 2020 - ofev seems to be helping - do walk test 01/26/2019   Encounter for therapeutic drug monitoring  - mild Gi symptoms with ofev for ipf. Otherwise tolerating it well   - Plan: - continue ofev with mylanta as needed  Pulmonary emphysema,  unspecified emphysema type ([facility] [place]) - stable   Plan: continue incruse daily with daily prednisone. Albuterol as needed  Shortness of breath - Coronary artery calcification seen on CAT scan -   Plan: refer cardiilogy for out of proportion shortness of breath and rule out angina  Abnormal computed tomography of thoracic spine  Non-small cell cancer of left lung ([place])   - Plan: do bone scan whole body (d/w [name])   Followup  - will call with results  - return in 6 weeks but after completing above         SIGNATURE    Dr. Brand Males, M.D., F.C.C.P,  Pulmonary and Critical Care Medicine Staff Physician, [place] Director - Interstitial Lung Disease  Program  [facility] [place], [place], [address]  Pager: [phone], If no answer or between  15:00h - 7:00h: call [phone] Telephone: [phone]  2:58 PM 01/26/2019

## 2019-01-26 NOTE — Telephone Encounter (Signed)
Called spoke with wife, they have an appt with Dr. Chase Caller today 01/26/19. They will discuss in more detail at this visit.   Nothing further needed at this time.

## 2019-01-26 NOTE — Progress Notes (Signed)
I have reviewed the results and agree with this plan

## 2019-01-26 NOTE — Addendum Note (Signed)
Addended by: Joella Prince on: 01/26/2019 04:05 PM   Modules accepted: Orders

## 2019-01-27 ENCOUNTER — Telehealth: Payer: Self-pay | Admitting: Internal Medicine

## 2019-01-27 NOTE — Telephone Encounter (Signed)
Called and spoke with patient's wife Mickel Baas, she is confused about a test needing to be done with cardiology. I informed her the only thing in the notes is that Dr. Chase Caller is going to refer them to Dr. Johnsie Cancel and that office would call her when appointment is available. She verbalized understanding. Nothing further needed at this time.

## 2019-02-03 ENCOUNTER — Encounter (HOSPITAL_COMMUNITY)
Admission: RE | Admit: 2019-02-03 | Discharge: 2019-02-03 | Disposition: A | Discharge status: Home / Self Care | Payer: Medicare Other | Source: Ambulatory Visit | Attending: Internal Medicine | Admitting: Internal Medicine

## 2019-02-03 ENCOUNTER — Telehealth: Payer: Self-pay | Admitting: Internal Medicine

## 2019-02-03 ENCOUNTER — Other Ambulatory Visit: Payer: Self-pay

## 2019-02-03 DIAGNOSIS — C3492 Malignant neoplasm of unspecified part of left bronchus or lung: Secondary | ICD-10-CM | POA: Insufficient documentation

## 2019-02-03 DIAGNOSIS — J84112 Idiopathic pulmonary fibrosis: Secondary | ICD-10-CM | POA: Diagnosis present

## 2019-02-03 DIAGNOSIS — R937 Abnormal findings on diagnostic imaging of other parts of musculoskeletal system: Secondary | ICD-10-CM | POA: Insufficient documentation

## 2019-02-03 NOTE — Telephone Encounter (Signed)
LMTCB

## 2019-02-03 NOTE — Telephone Encounter (Signed)
Let [employer] and wife know that there are 2 HOT spots on the bone scan v-one is in the spine T6 and another in left hip. The radiologist is unable to tell if this is due to cancer or something else  PLAN  - refer back to DR [facility]. They need to make office visit with him     SIGNATURE    Dr. Brand Males, M.D., F.C.C.P,  Pulmonary and Critical Care Medicine Staff Physician, [place] Director - Interstitial Lung Disease  Program  Pulmonary [place] at [place], [place], [address]  Pager: [phone], If no answer or between  15:00h - 7:00h: call [phone] Telephone: [phone]  4:52 PM 02/03/2019      Nm Bone Scan Whole Body  Result Date: 02/03/2019 CLINICAL DATA:  Lung cancer, abnormal CT thoracic spine at T6, shortness of breath, pulmonary fibrosis EXAM: NUCLEAR MEDICINE WHOLE BODY BONE SCAN TECHNIQUE: Whole body anterior and posterior images were obtained approximately 3 hours after intravenous injection of radiopharmaceutical. RADIOPHARMACEUTICALS:  20 mCi Technetium-43mMDP IV COMPARISON:  None Correlation: CT chest 01/13/2019 FINDINGS: Linear increased tracer accumulation in T6 vertebral body, corresponding to CT abnormality. Focal uptake at lateral aspect of LEFT greater trochanter, nonspecific. No other worrisome sites of osseous tracer accumulation are identified. Expected urinary tract and soft tissue distribution of tracer. IMPRESSION: Increased uptake in T6 vertebral body corresponding to compression fracture with lucency and sclerosis as identified on CT. This could represent a benign or pathologic compression fracture. Uptake at lateral aspect of LEFT greater trochanter, could be due to trauma but metastatic disease is not excluded; recommend radiographic correlation. No additional sites of osseous metastasis identified scintigraphically. Electronically Signed   By: MLavonia DanaM.D.   On:  02/03/2019 16:19

## 2019-02-04 NOTE — Telephone Encounter (Signed)
Spoke with the pt's spouse and notified of results and recs per Dr Chase Caller She verbalized understanding  Order was placed for oncology

## 2019-02-04 NOTE — Telephone Encounter (Signed)
Patient wife Jason Summers is returning phone call.  Patient phone number is [phone],

## 2019-02-07 ENCOUNTER — Telehealth: Payer: Self-pay | Admitting: Internal Medicine

## 2019-02-07 NOTE — Telephone Encounter (Signed)
Confirmed 9/2 appointment with spouse. Date/time/APP per provider via new patient scheduler.

## 2019-02-09 ENCOUNTER — Inpatient Hospital Stay: Payer: Medicare Other | Attending: Physician Assistant | Admitting: Physician Assistant

## 2019-02-09 ENCOUNTER — Other Ambulatory Visit: Payer: Self-pay

## 2019-02-09 ENCOUNTER — Encounter: Payer: Self-pay | Admitting: Physician Assistant

## 2019-02-09 VITALS — BP 133/73 | HR 87 | Temp 98.2°F | Resp 24 | Ht 66.0 in | Wt 167.0 lb

## 2019-02-09 DIAGNOSIS — Z9221 Personal history of antineoplastic chemotherapy: Secondary | ICD-10-CM | POA: Diagnosis not present

## 2019-02-09 DIAGNOSIS — S22000A Wedge compression fracture of unspecified thoracic vertebra, initial encounter for closed fracture: Secondary | ICD-10-CM

## 2019-02-09 DIAGNOSIS — Z923 Personal history of irradiation: Secondary | ICD-10-CM | POA: Insufficient documentation

## 2019-02-09 DIAGNOSIS — M129 Arthropathy, unspecified: Secondary | ICD-10-CM | POA: Insufficient documentation

## 2019-02-09 DIAGNOSIS — I700 Atherosclerosis of aorta: Secondary | ICD-10-CM | POA: Insufficient documentation

## 2019-02-09 DIAGNOSIS — R1310 Dysphagia, unspecified: Secondary | ICD-10-CM | POA: Diagnosis not present

## 2019-02-09 DIAGNOSIS — Z85118 Personal history of other malignant neoplasm of bronchus and lung: Secondary | ICD-10-CM | POA: Insufficient documentation

## 2019-02-09 DIAGNOSIS — C3492 Malignant neoplasm of unspecified part of left bronchus or lung: Secondary | ICD-10-CM | POA: Diagnosis not present

## 2019-02-09 DIAGNOSIS — M546 Pain in thoracic spine: Secondary | ICD-10-CM | POA: Insufficient documentation

## 2019-02-09 DIAGNOSIS — J84112 Idiopathic pulmonary fibrosis: Secondary | ICD-10-CM | POA: Diagnosis not present

## 2019-02-09 DIAGNOSIS — Z79899 Other long term (current) drug therapy: Secondary | ICD-10-CM | POA: Insufficient documentation

## 2019-02-09 HISTORY — DX: Wedge compression fracture of unspecified thoracic vertebra, initial encounter for closed fracture: S22.000A

## 2019-02-09 NOTE — Progress Notes (Signed)
[facility] OFFICE PROGRESS NOTE  Jason Peng, NP [place] [address]  DIAGNOSIS: Stage IIIA (T3, N2, M0) non-small cell lung cancer, poorly differentiated squamous cell carcinoma diagnosed in February 2017 with PDL 1 expression of 35%, presented with large left lower lobe lung mass in addition to left upper lobe lung nodule and mediastinal lymphadenopathy  PRIOR THERAPY: Course of concurrent chemoradiation with weekly carboplatin for AUC of 2 and paclitaxel 45 MG/M2. Status post 7 cycles, last dose was given 11/19/2015 with partial response.  CURRENT THERAPY: Observation.  INTERVAL HISTORY: Jason Summers 85 y.o. male returns to the clinic for a follow-up visit accompanied by his wife. The patient is feeling fairly well today without any concerning complaints except for worsening shortness of breath with exertion secondary to his idiopathic pulmonary fibrosis. The patient is followed closely by Dr. Chase Caller for this condition and is being treated with nintedanib.  The patient mentioned that he occasionally feels a midline/right sided pain near his thoracic spine. The patient states that this has been going on for approximately 2 to 3 weeks. He denies any traumas, falls, or injuries to this region.  The patient states that the pain "comes and goes", however; it tends to get worse in the evening and when he is lying down. The patient states that he occasionally takes Tylenol every couple days as needed for the pain. The patient denies any bladder or stool incontinence or numbness/weakness/tingling in the lower extremity.   Otherwise, the patient is feeling fairly well except for a decreased appetite.  He has not lost any weight in the interval.  He denies any recent fever, chills, or night sweats.  He reports his baseline productive cough which produces clear mucus and occasional pleuritic right-sided anterior chest pain which has been present for several  years.  He denies any hemoptysis.  He denies any nausea, vomiting, diarrhea, or constipation.  He denies any headache or visual changes. The patient recently had a bone scan performed. He is here today for evaluation and to review his scan results.  MEDICAL HISTORY: Past Medical History:  Diagnosis Date  . Arthritis   . Cancer ([place])    basal cell on right temple  . Hemorrhoids   . Lateral epicondylitis (tennis elbow)   . Non-small cell lung cancer (NSCLC) ([place]) dx'f 07/30/15  . Odynophagia 11/06/2015  . Spondylosis of cervical joint     ALLERGIES:  is allergic to gentamicin and loteprednol-tobramycin.  MEDICATIONS:  Current Outpatient Medications  Medication Sig Dispense Refill  . albuterol (PROVENTIL HFA;VENTOLIN HFA) 108 (90 Base) MCG/ACT inhaler Inhale 2 puffs into the lungs every 6 (six) hours as needed for wheezing or shortness of breath. 1 Inhaler 6  . Alum & Mag Hydroxide-Simeth (MYLANTA PO) Take by mouth. In liquid form takes at night as needed.    . calcium carbonate (TUMS - DOSED IN MG ELEMENTAL CALCIUM) 500 MG chewable tablet Chew 1 tablet by mouth 2 (two) times daily as needed.     Marland Kitchen esomeprazole (NEXIUM) 40 MG capsule Take 1 capsule (40 mg total) by mouth daily. 90 capsule 3  . glucosamine-chondroitin 500-400 MG tablet Take 1 tablet by mouth daily. Reported on 09/11/2015    . OFEV 150 MG CAPS Take 1 tablet by mouth 2 (two) times daily.    . predniSONE (DELTASONE) 10 MG tablet Take 1.5 tablets (15 mg total) by mouth daily with breakfast. 44 tablet 1  . traZODone (DESYREL) 50 MG tablet TAKE  2 TABLETS BY MOUTH AT BEDTIME 180 tablet 0  . umeclidinium bromide (INCRUSE ELLIPTA) 62.5 MCG/INH AEPB Inhale 1 puff into the lungs daily. 1 each 6  . tamsulosin (FLOMAX) 0.4 MG CAPS capsule Take 1 capsule (0.4 mg total) by mouth daily. (Patient not taking: Reported on 02/09/2019) 30 capsule 3   No current facility-administered medications for this visit.     SURGICAL HISTORY:  Past  Surgical History:  Procedure Laterality Date  . COLONOSCOPY  2003  . KNEE ARTHROSCOPY  2006   right    REVIEW OF SYSTEMS:   Review of Systems  Constitutional: Positive for decreased appetite. Negative for chills, fatigue, fever and unexpected weight change.  HENT: Negative for mouth sores, nosebleeds, sore throat and trouble swallowing.   Eyes: Negative for eye problems and icterus.  Respiratory: Positive for baseline shortness of breath and productive cough. Negative for hemoptysis and wheezing.   Cardiovascular: Positive for occasional right thoracic pleuritic chest pain (unchanged). Negative for leg swelling.  Gastrointestinal: Negative for abdominal pain, constipation, diarrhea, nausea and vomiting.  Genitourinary: Negative for bladder incontinence, difficulty urinating, dysuria, frequency and hematuria.   Musculoskeletal: Positive for thoracic back pain. Negative for gait problem, neck pain and neck stiffness.  Skin: Negative for itching and rash.  Neurological: Negative for dizziness, extremity weakness, gait problem, headaches, light-headedness and seizures.  Hematological: Negative for adenopathy. Does not bruise/bleed easily.  Psychiatric/Behavioral: Negative for confusion, depression and sleep disturbance. The patient is not nervous/anxious.     PHYSICAL EXAMINATION:  Blood pressure 133/73, pulse 87, temperature 98.2 F (36.8 C), resp. rate (!) 24, height _0  (1.676 m), weight 167 lb (75.8 kg), SpO2 95 %.  ECOG PERFORMANCE STATUS: 1 - Symptomatic but completely ambulatory  Physical Exam  Constitutional: Oriented to person, place, and time and elderly appearing male and in no distress.  HENT:  Head: Normocephalic and atraumatic.  Mouth/Throat: Oropharynx is clear and moist. No oropharyngeal exudate.  Eyes: Conjunctivae are normal. Right eye exhibits no discharge. Left eye exhibits no discharge. No scleral icterus.  Neck: Normal range of motion. Neck supple.   Cardiovascular: Normal rate, regular rhythm, normal heart sounds and intact distal pulses.   Pulmonary/Chest: Effort normal and breath sounds normal. No respiratory distress. No wheezes. No rales.  Abdominal: Soft. Bowel sounds are normal. Exhibits no distension and no mass. There is no tenderness.  Musculoskeletal: Normal range of motion. Exhibits no edema. No pain to palpation of the thoracic spine/paraverterbral muscles. Lymphadenopathy:    No cervical adenopathy.  Neurological: Alert and oriented to person, place, and time. Exhibits normal muscle tone. Gait normal. Coordination normal.  Skin: Skin is warm and dry. No rash noted. Not diaphoretic. No erythema. No pallor.  Psychiatric: Mood, memory and judgment normal.  Vitals reviewed.  LABORATORY DATA: Lab Results  Component Value Date   WBC 8.5 01/14/2019   HGB 14.4 01/14/2019   HCT 42.8 01/14/2019   MCV 89.8 01/14/2019   PLT 175.0 01/14/2019      Chemistry      Component Value Date/Time   NA 136 12/09/2018 1524   NA 139 01/27/2017 0826   K 4.1 12/09/2018 1524   K 4.4 01/27/2017 0826   CL 102 12/09/2018 1524   CO2 26 12/09/2018 1524   CO2 25 01/27/2017 0826   BUN 22 12/09/2018 1524   BUN 15.5 01/27/2017 0826   CREATININE 0.94 12/09/2018 1524   CREATININE 0.90 02/12/2018 0841   CREATININE 0.9 01/27/2017 0826  Component Value Date/Time   CALCIUM 8.9 12/09/2018 1524   CALCIUM 9.1 01/27/2017 0826   ALKPHOS 65 12/09/2018 1524   ALKPHOS 89 01/27/2017 0826   AST 13 12/09/2018 1524   AST 14 (L) 02/12/2018 0841   AST 16 01/27/2017 0826   ALT 20 12/09/2018 1524   ALT 8 02/12/2018 0841   ALT 13 01/27/2017 0826   BILITOT 0.5 12/09/2018 1524   BILITOT 0.5 02/12/2018 0841   BILITOT 0.52 01/27/2017 0826       RADIOGRAPHIC STUDIES:  Nm Bone Scan Whole Body  Result Date: 02/03/2019 CLINICAL DATA:  Lung cancer, abnormal CT thoracic spine at T6, shortness of breath, pulmonary fibrosis EXAM: NUCLEAR MEDICINE WHOLE  BODY BONE SCAN TECHNIQUE: Whole body anterior and posterior images were obtained approximately 3 hours after intravenous injection of radiopharmaceutical. RADIOPHARMACEUTICALS:  20 mCi Technetium-79mMDP IV COMPARISON:  None Correlation: CT chest 01/13/2019 FINDINGS: Linear increased tracer accumulation in T6 vertebral body, corresponding to CT abnormality. Focal uptake at lateral aspect of LEFT greater trochanter, nonspecific. No other worrisome sites of osseous tracer accumulation are identified. Expected urinary tract and soft tissue distribution of tracer. IMPRESSION: Increased uptake in T6 vertebral body corresponding to compression fracture with lucency and sclerosis as identified on CT. This could represent a benign or pathologic compression fracture. Uptake at lateral aspect of LEFT greater trochanter, could be due to trauma but metastatic disease is not excluded; recommend radiographic correlation. No additional sites of osseous metastasis identified scintigraphically. Electronically Signed   By: MLavonia DanaM.D.   On: 02/03/2019 16:19   Ct Chest High Resolution  Result Date: 01/13/2019 CLINICAL DATA:  Interstitial lung disease, IPF, follow-up lung cancer and left lower lobectomy EXAM: CT CHEST WITHOUT CONTRAST TECHNIQUE: Multidetector CT imaging of the chest was performed following the standard protocol without intravenous contrast. High resolution imaging of the lungs, as well as inspiratory and expiratory imaging, was performed. COMPARISON:  06/22/2018, 02/12/2018, 08/06/2017, 08/05/2016, 05/05/2016, 12/31/2015 FINDINGS: Cardiovascular: Aortic atherosclerosis. Aortic valve calcifications. Normal heart size. Three-vessel coronary artery calcifications. No pericardial effusion. Mediastinum/Nodes: No change in prominent pretracheal lymph nodes. Thyroid gland, trachea, and esophagus demonstrate no significant findings. Lungs/Pleura: No significant interval change in moderate to severe pulmonary fibrosis  in a pattern with apical to basal gradient featuring traction bronchiectasis, peripheral bronchiolectasis and honeycombing, most notable in the right lung base. No significant air trapping on expiratory phase imaging. Findings are substantially and rapidly worsened on sequential examinations over time dating back to 12/31/2015. No change in post treatment appearance of the left lower lobe. Unchanged small, loculated left pleural effusion. Upper Abdomen: No acute abnormality. Musculoskeletal: Redemonstrated sclerotic wedge deformity of T6. IMPRESSION: 1. No significant interval change in moderate to severe pulmonary fibrosis in a pattern with apical to basal gradient featuring traction bronchiectasis, peripheral bronchiolectasis and honeycombing, most notable in the right lung base. Although not significantly changed when compared to immediate prior examination, findings are substantially and rapidly worsened on sequential examinations over time dating back to 12/31/2015. Findings remain in a UIP pattern and consistent with clinical diagnosis of IPF. 2. No change in post treatment appearance of the left lower lobe. Unchanged small, loculated left pleural effusion. 3. Redemonstrated sclerotic wedge deformity of T6, which remains generally suspicious for osseous metastasis. 4. Coronary artery disease, aortic atherosclerosis, and aortic valve calcifications. Electronically Signed   By: AEddie CandleM.D.   On: 01/13/2019 09:50     ASSESSMENT/PLAN:  This is a very pleasant 85year old Caucasian  male with stage IIIA non-small cell lung cancer, squamous cell carcinoma. He was diagnosed in February of 2017.   The patient completed a course of concurrent chemoradiation with weekly carboplatin and paclitaxel for 7 cycles and tolerated his treatment well except for radiation induced pneumonitis as well as odynophagia and dysphagia secondary to radiation induced esophagitis.   The patient has been in observation for  over 3 years and he has been doing fine except for the progressive shortness of breath secondary to idiopathic pulmonary fibrosis.  He is currently undergoing treatment for the pulmonary fibrosis under the care of Dr. Chase Caller.  The patient recently had a bone scan performed to evaluate the T6 wedge deformity on his recent CT scan of the chest in August 2020.   Dr. Julien Nordmann personally and independently reviewed the scan and discussed the results with the patient and his wife today. The scan showed increased uptake in the T6 vertebral body corresponding to a compression fracture which could represent a benign or pathological compression fracture.   Dr. Julien Nordmann recommended a referral to interventional radiology for consideration of a biopsy and kyphoplasty of this region.   The patient and his wife would like to discuss this with their family before proceeding with making the referral. Given the patient's advanced age and idiopathic lung fibrosis, they are unsure if they would like to proceed with the procedure at this time. They were provided with a number to our clinic to notify us if they wish to proceed with the referral.   The patient will continue taking tylenol as needed for his back pain.   We will see the patient on as-needed basis at this time if he has any evidence of recurrence of his lung cancer. He will continue with his routine follow up visits and scans with his pulmonologist, Dr. Chase Caller.   The patient was advised to call immediately if he has any concerning symptoms in the interval. The patient voices understanding of current disease status and treatment options and is in agreement with the current care plan. All questions were answered. The patient knows to call the clinic with any problems, questions or concerns. We can certainly see the patient much sooner if necessary  No orders of the defined types were placed in this encounter.    Kaavya Puskarich L Jaysen Wey,  PA-C 02/09/19  ADDENDUM: Hematology/Oncology Attending: I had a face-to-face encounter with the patient today.  I recommended his care plan.  This is a very pleasant 85 years old white male with history of a stage IIIa non-small cell lung cancer status post a course of concurrent chemoradiation complicated with radiation-induced pneumonitis and fibrosis.  The patient has been on observation for more than 3 years and he has been doing very well with no concerning issues except for the persistent shortness of breath secondary to pulmonary fibrosis and interstitial lung disease followed by Dr. Chase Caller.  Recent imaging studies showed questionable T6 vertebral body compression fracture.  The patient had a bone scan that also showed similar finding. I had a lengthy discussion with the patient and his wife about his current condition and further investigation and treatment options.  I gave the patient the option of referral to interventional radiology for consideration of biopsy and kyphoplasty but he would like to think about his options before making a decision. His previous scan showed no other concerning findings for disease recurrence or progression. I recommended for him to continue his routine follow-up visit and evaluation by Dr. Chase Caller and we  will see him on as-needed basis at this point. He was advised to call if he has any concerning symptoms in the interval.  Disclaimer: This note was dictated with voice recognition software. Similar sounding words can inadvertently be transcribed and may be missed upon review. Eilleen Kempf, MD 02/09/19

## 2019-02-11 ENCOUNTER — Other Ambulatory Visit: Payer: Self-pay | Admitting: Pulmonary Disease

## 2019-03-22 NOTE — Progress Notes (Signed)
CARDIOLOGY CONSULT NOTE       Patient ID: Jason Summers MRN: [id number] DOB/AGE: 1932-03-24 85 y.o.  Admit date: (Not on file) Referring Physician: Chase Caller Primary Physician: [name], NP Primary Cardiologist: New Reason for Consultation: Dyspnea  Active Problems:   * No active hospital problems. *   HPI:  85 y.o. referred by Dr Chase Caller for dyspnea. Has not been seen in cardiology for over 3 years He is originally from [place].  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  I know his son Jason Summers well In 2017 had normal echo History of lung cancer stage 3 non small cell post XRT and chemo Subsequent CT with ILD and IPF Wears oxygen at night   Echo 06/22/18 with EF 55-60% only grade one diastolic dysfunction normal for age mild MR  Bone scan 02/03/19 with left hip uptake   Concern that dyspnea dysproportionate to lung disease although this is hard to imagine given his cancer, radiation and ILD requiring oxygen   No history of CAD:  No chest pain Does not want to wear oxygen. Says his sats stay above 90% With ambulation   ROS All other systems reviewed and negative except as noted above  Past Medical History:  Diagnosis Date  . Acute rhinitis 08/10/2017  . Acute stress disorder 10/27/2007   Qualifier: Diagnosis of  By: Arnoldo Morale MD, Balinda Quails   Overview:  Overview:  Qualifier: Diagnosis of  By: Arnoldo Morale MD, Balinda Quails  . Anxiety 07/14/2018  . Arthritis   . Bilateral epiphora 09/25/2015  . Blush 09/23/2007   Qualifier: Diagnosis of  By: Arnoldo Morale MD, Balinda Quails   Overview:  Overview:  Qualifier: Diagnosis of  By: Arnoldo Morale MD, [facility])    basal cell on right temple  . Chest pain in adult 12/16/2016  . Chronic right-sided thoracic back pain 12/16/2016  . Compression fracture of body of thoracic vertebra ([place]) 02/09/2019  . Dysphagia 07/17/2010   Qualifier: Diagnosis of  By: Trellis Paganini PA-c, Amy S   . Eustachian tube dysfunction, bilateral  08/10/2017  . Ganglion of joint 04/22/2007   Qualifier: Diagnosis of  By: Arnoldo Morale MD, Balinda Quails   . Gastroesophageal reflux disease 05/15/2016  . Headache 01/30/2016  . Hemorrhoids   . HEMORRHOIDS, INTERNAL 02/18/2007   Qualifier: Diagnosis of  By: Arnoldo Morale MD, Balinda Quails   . IPF (idiopathic pulmonary fibrosis) ([place]) 07/14/2018   06/22/2018-CT chest with contrast- spectrum of findings suggestive of basilar predominant fibrotic interstitial lung disease with mild honeycombing asymmetrically involving the right lung with significant progression since February/2019 in September/2019 chest CT.  Findings consistent with UIP.  06/22/2018-echocardiogram- LV ejection fraction 55 to 21%, grade 1 diastolic dysfunction, PA P pressure 4  . Lateral epicondylitis (tennis elbow)   . Lingular mass 09/07/2015  . Mass of lingula of lung 09/07/2015  . Mass of lower lobe of left lung 09/07/2015  . Mediastinal adenopathy 09/07/2015  . Mediastinal lymphadenopathy 08/27/2015  . Multiple pulmonary nodules 08/27/2015  . Nausea and vomiting 05/15/2016  . Non-small cell cancer of left lung ([place]) 09/24/2015   09/13/2015: Left lower lobe lung biopsy: Overall, the histologic features, in conjunction with the positive staining for cytokeratin 903, cytokeratin 5/6, and p63 support a diagnosis of poorly differentiated squamous cell carcinoma. There is likely sufficient tumor remaining for additional studies, if requested. (JBK:ds 09/17/15)     . Non-small cell lung cancer (NSCLC) ([place]) dx'f 07/30/15  .  Nuclear sclerosis of right eye 09/25/2015  . Odynophagia 11/06/2015  . Osteoarthritis 07/17/2010   Qualifier: Diagnosis of  By: Trellis Paganini PA-c, Amy S   . Persistent headaches 02/03/2017  . Pleural effusion on left 08/14/2016  . Pseudophakia, left eye 09/25/2015  . Pulmonary emphysema ([place]) 08/14/2016  . Radiation fibrosis of lung ([place]) 08/14/2016  . Shortness of breath 08/14/2016  . Soft tissue lesion of shoulder region 04/22/2007   Qualifier: Diagnosis of  By:  Arnoldo Morale MD, Balinda Quails   Overview:  Overview:  Qualifier: Diagnosis of  By: Arnoldo Morale MD, Balinda Quails  . Spondylosis of cervical joint   . SPRAIN AND STRAIN OF SACROILIAC 09/06/2008   Qualifier: Diagnosis of  By: Arnoldo Morale MD, Balinda Quails     Family History  Problem Relation Age of Onset  . Hypertension Mother   . Heart disease Mother   . Coronary artery disease Other     Social History   Socioeconomic History  . Marital status: Married    Spouse name: Not on file  . Number of children: Not on file  . Years of education: Not on file  . Highest education level: Not on file  Occupational History  . Occupation: Retired     Comment: Animal nutritionist  . Financial resource strain: Not on file  . Food insecurity    Worry: Not on file    Inability: Not on file  . Transportation needs    Medical: Not on file    Non-medical: Not on file  Tobacco Use  . Smoking status: Former Smoker    Packs/day: 0.20    Years: 62.00    Pack years: 12.40    Types: Cigarettes    Quit date: 03/10/2011    Years since quitting: 8.0  . Smokeless tobacco: Never Used  Substance and Sexual Activity  . Alcohol use: Yes    Alcohol/week: 7.0 standard drinks    Types: 7 Glasses of wine per week    Comment: 1-2 glasses of wine a day  . Drug use: No  . Sexual activity: Yes  Lifestyle  . Physical activity    Days per week: Not on file    Minutes per session: Not on file  . Stress: Not on file  Relationships  . Social Herbalist on phone: Not on file    Gets together: Not on file    Attends religious service: Not on file    Active member of club or organization: Not on file    Attends meetings of clubs or organizations: Not on file    Relationship status: Not on file  . Intimate partner violence    Fear of current or ex partner: Not on file    Emotionally abused: Not on file    Physically abused: Not on file    Forced sexual activity: Not on file  Other Topics Concern  . Not on file  Social  History Narrative  . Not on file    Past Surgical History:  Procedure Laterality Date  . COLONOSCOPY  2003  . KNEE ARTHROSCOPY  2006   right        Physical Exam: Blood pressure 120/68, pulse 89, height _0  (1.676 m), weight 171 lb (77.6 kg), SpO2 93 %.    Affect appropriate Chronically ill male  HEENT: normal Neck supple with no adenopathy JVP normal no bruits no thyromegaly Lungs ILD velcro crackles right base especially  Heart:  S1/S2 no murmur, no  rub, gallop or click PMI normal Abdomen: benighn, BS positve, no tenderness, no AAA no bruit.  No HSM or HJR Distal pulses intact with no bruits No edema Neuro non-focal Skin warm and dry No muscular weakness   Labs:   Lab Results  Component Value Date   WBC 8.5 01/14/2019   HGB 14.4 01/14/2019   HCT 42.8 01/14/2019   MCV 89.8 01/14/2019   PLT 175.0 01/14/2019   No results for input(s): NA, K, CL, CO2, BUN, CREATININE, CALCIUM, PROT, BILITOT, ALKPHOS, ALT, AST, GLUCOSE in the last 168 hours.  Invalid input(s): LABALBU No results found for: CKTOTAL, CKMB, CKMBINDEX, TROPONINI  Lab Results  Component Value Date   CHOL 161 03/23/2018   CHOL 177 03/02/2017   CHOL 192 10/20/2014   Lab Results  Component Value Date   HDL 48.60 03/23/2018   HDL 46.40 03/02/2017   HDL 61.60 10/20/2014   Lab Results  Component Value Date   LDLCALC 100 (H) 03/23/2018   LDLCALC 112 (H) 03/02/2017   LDLCALC 110 (H) 10/20/2014   Lab Results  Component Value Date   TRIG 62.0 03/23/2018   TRIG 90.0 03/02/2017   TRIG 104.0 10/20/2014   Lab Results  Component Value Date   CHOLHDL 3 03/23/2018   CHOLHDL 4 03/02/2017   CHOLHDL 3 10/20/2014   Lab Results  Component Value Date   LDLDIRECT 118.5 08/21/2011   LDLDIRECT 129.2 05/07/2010   LDLDIRECT 147.9 02/18/2007      Radiology: No results found.  EKG: 2017 SR prominent R V1 PVC 03/25/19 NSR rate 89 normal    ASSESSMENT AND PLAN:   1. Dyspnea;  Appears to be mostly  related to anxiety and lung disease He is unable to mechanically perform PFTls. His echo is benign in January with normal EF and abnormal relaxation normal for age. Pulmonary has concern that dyspnea is anginal equivalent. This is unlikely Offered for patient to have stress test or cardiopulmlonary stress test and they deferred   2. Lung Cancer post chemo XRT using oxygen at night with inhalers F/U Oncology for abnormal bone scan    Signed: Jenkins Rouge 03/25/2019, 8:46 AM

## 2019-03-25 ENCOUNTER — Encounter: Payer: Self-pay | Admitting: Cardiovascular Disease

## 2019-03-25 ENCOUNTER — Other Ambulatory Visit: Payer: Self-pay

## 2019-03-25 ENCOUNTER — Ambulatory Visit (INDEPENDENT_AMBULATORY_CARE_PROVIDER_SITE_OTHER): Payer: Medicare Other | Admitting: Cardiovascular Disease

## 2019-03-25 VITALS — BP 120/68 | HR 89 | Ht 66.0 in | Wt 171.0 lb

## 2019-03-25 DIAGNOSIS — R0600 Dyspnea, unspecified: Secondary | ICD-10-CM | POA: Diagnosis not present

## 2019-03-25 NOTE — Patient Instructions (Addendum)
Medication Instructions:   *If you need a refill on your cardiac medications before your next appointment, please call your pharmacy*  Lab Work:  If you have labs (blood work) drawn today and your tests are completely normal, you will receive your results only by: Marland Kitchen MyChart Message (if you have MyChart) OR . A paper copy in the mail If you have any lab test that is abnormal or we need to change your treatment, we will call you to review the results.  Testing/Procedures: None ordered today.  Follow-Up: At [facility], you and your health needs are our priority.  As part of our continuing mission to provide you with exceptional heart care, we have created designated Provider Care Teams.  These Care Teams include your primary Cardiologist (physician) and Advanced Practice Providers (APPs -  Physician Assistants and Nurse Practitioners) who all work together to provide you with the care you need, when you need it.  Your next appointment:   As Needed  The format for your next appointment:   In Person  Provider:   You may see Dr. Johnsie Cancel or one of the following Advanced Practice Providers on your designated Care Team:    Truitt Merle, NP  Cecilie Kicks, NP  Kathyrn Drown, NP

## 2019-03-28 ENCOUNTER — Encounter: Payer: Self-pay | Admitting: Internal Medicine

## 2019-03-28 ENCOUNTER — Ambulatory Visit (INDEPENDENT_AMBULATORY_CARE_PROVIDER_SITE_OTHER): Payer: Medicare Other | Admitting: Internal Medicine

## 2019-03-28 ENCOUNTER — Other Ambulatory Visit: Payer: Self-pay

## 2019-03-28 VITALS — BP 132/80 | HR 83 | Temp 97.0°F | Ht 66.0 in | Wt 173.4 lb

## 2019-03-28 DIAGNOSIS — Z5181 Encounter for therapeutic drug level monitoring: Secondary | ICD-10-CM | POA: Diagnosis not present

## 2019-03-28 DIAGNOSIS — J84112 Idiopathic pulmonary fibrosis: Secondary | ICD-10-CM | POA: Diagnosis not present

## 2019-03-28 DIAGNOSIS — R490 Dysphonia: Secondary | ICD-10-CM

## 2019-03-28 DIAGNOSIS — J439 Emphysema, unspecified: Secondary | ICD-10-CM

## 2019-03-28 LAB — HEPATIC FUNCTION PANEL
ALT: 26 U/L (ref 0–53)
AST: 17 U/L (ref 0–37)
Albumin: 4 g/dL (ref 3.5–5.2)
Alkaline Phosphatase: 61 U/L (ref 39–117)
Bilirubin, Direct: 0.1 mg/dL (ref 0.0–0.3)
Total Bilirubin: 0.5 mg/dL (ref 0.2–1.2)
Total Protein: 6.9 g/dL (ref 6.0–8.3)

## 2019-03-28 NOTE — Progress Notes (Signed)
IOV 12/16/2016  Chief Complaint  Patient presents with   Acute Visit    Pt c/o chest tightness, mid back pain, and right lateral rib pain when pt takes a deep breath in. Pt also c/o mild dry cough with some chest congestion. Pt denies f/c/s.     85 year old male with stage III non-small cell lung cancer and resultant radiation related pleural effusion. This supposed to go to [place] his native country in June 2018 but he canceled because of his declining health. He tells me that his functional status is pretty good. His appetite is pretty good. However he is having worsening lower thoracic back pain. He saw my colleague approximately one month ago. Chest x-ray was unchanged. He was advised nonsteroidal anti-inflammatory drugs and Tylenol for pain relief. But now the pain has broken through. The pain is worse. It is present in the midthoracic right infra-axillary and lower anterior chest area. It gets worse with inspiration. It is severe. He feels he might need something stronger for pain relief. There is no neurologic deficit. There is no constipation. His effort tolerance is okay with there is slight increased fatigue.   OV 03/27/2017  Chief Complaint  Patient presents with   Follow-up    Pt still has some pain in his back but it is better than it ws at last visit. Pt has noticed that he will occ. lose his voice and he has become weak, especially in the mornings. [name] states that pt has been taking some OTC for a cough and states that occ. it will be hard for him to breathe and does have occ. CP.    85 year old male with non-small cell lung cancer on observation therapy. I last saw him in July 2018. At that time he was bothered by left-sided chest pain. There was concern about cancer recurrence but a PET scan at that time showed improvement. Since then he's been on observation therapy.He now presents with his wife. He tells me that the pain is not much of an issue. He only has  very mild shortness of breath. But they both tell me that he is extremely fatigued. Particularly early in the morning. He has insomnia. Wife does think that he is deconditioned. Last lab work in August 2018 was fine. Has no symptoms of COPD exacerbation. He is up-to-date with his flu shot.he has had some headaches and neurology for that He might be depressed Walking desaturation test 185 feet 3 laps on room air: He finished it without any problems other than mild shortness of breath. Resting heart rate was 87/m. Final heart rate was 98/m. Resting pulse ox 100% final pulse ox 98%.   OV 06/29/2017  Chief Complaint  Patient presents with   Follow-up    Pt and his wife states he is doing much better since last visit. Pt has been going to PT which has helped him gain strengh back, walking better, and brething better.   He has mild emphysema and this is follow-up for that.  He is here with his wife.  They tell me that he is not taking his inhalers.  Because it really does not benefit him.  The bigger issues that have been recently dealing with other issues of fatigue and low motivation and physical deconditioning.  Therefore at last visit we put him into pulmonary rehabilitation.  We checked a lot of his blood work it ended up that his vitamin D was very low.  Put him on  vitamin D replacement.  Now he reports that with both vitamin D replacement and with pulmonary rehabilitation he has significant improvement in his fatigue and energy levels.  He feels back to baseline.  In particular tl pulmonary rehabilitation has helped him.  They both are worried about the extent of lung cancer and the odds of recurrence.  The having follow-up CT scan with Dr. Julien Nordmann in a month or 2.  They feel that if his physical condition will allow and the CT scan does not show any recurrence he would want to visit [place] in the summer 2019.  Otherwise feeling good.   OV 10/28/2017  Chief Complaint  Patient presents with    Follow-up    Pt states in the mornings it is hard to breathe. States he believes the fatigue has become better.   Jason Summers presents for follow-up with his wife.  The issues to be discussed are emphysema, shortness of breath and fatigue with vitamin D deficiency in the setting of lung cancer on observation therapy.  In addition there is travel advice coming up in 2 weeks he is going to [place] for a few months.  Overall stable.  He is no longer doing pulmonary rehab.  He uses only albuterol as needed because he does not want to do maintenance inhalers.  We told him to do his vitamin D once a month but I am not so sure as to what regimen he is following right now.  In the interim it appears that exertional dyspnea slightly worse although the fatigue component might be the same.  Wife feels is because he is anxious and because he wants to do more than his body is allowing him to do.  He does not follow the pulmonary rehabilitation home prescription accurately.  I reviewed the old chart found that Dr. Julien Nordmann has placed him on observation therapy after seeing him in March 2019.  Apparently there is a scan coming up in September 2019.  He clearly wants to be able to do more.  Walking desaturation test he got tachycardic and did not desaturate below 97%.  He is going to [place] early part of June 2019.  Is a long flight to [place].  Given the persistence of his dyspnea we did a deep dive with an objective dyspnea questionnaire.  It appears that the worst his dyspnea is level 3 out of 5.  He is level 3 for walking on a level with others of his age and walking uphill, picking up and straightening.  He is a level 2 dyspnea walking upstairs and washing the car.  He is a level 1 dyspnea for dressing watering the lawn sexual activities.  But is a level 0 dyspnea while eating or standing up from a chair of brushing teeth or showering or shaving.  It is associated with fatigue and arthralgia     DIAGNOSIS: Stage IIIA  (T3, N2, M0) non-small cell lung cancer, poorly differentiated squamous cell carcinoma diagnosed in February 2017 with PDL 1 expression of 35%, presented with large left lower lobe lung mass in addition to left upper lobe lung nodule and mediastinal lymphadenopathy. PRIOR THERAPY: Course of concurrent chemoradiation with weekly carboplatin for AUC of 2 and paclitaxel 45 MG/M2. Status post 7 cycles, last dose was given 11/19/2015 with partial response.  CURRENT THERAPY: Observation.       OV 03/03/2018  Subjective:  Patient ID: Jason Summers, male , DOB: 1932-06-02 , age 40 y.o. , MRN: [id number] , ADDRESS: [id number] [place] [facility] [id number]   03/03/2018 -   Chief Complaint  Patient presents with   Follow-up    SOB with exertion, still on Spiriva and feels it is working      HPI [employer] 85 y.o. -follow-up for shortness of breath related to radiation fibrosis of the lung, mild emphysema and physical deconditioning.  After last visit in May 2019 we started him on Spiriva.  He says this helped only a little bit.  He still has fixed dyspnea on exertion relieved by rest.  He was in [place] and this limited his exertion at various places.  However he says overall he is stable since last visit.  He is definitely not worse.  No associated chest pain or wheezing or cough.  He had a CT scan of the chest September 2019 that I personally visualized and agree with the report that this shows radiation related changes but no active cancer.  Also for the last few months he is on and off hoarseness of voice with variable tone in his voice.  Wife thinks is because of allergies.  Wife feels that patient has fixed issues and needs to accept his quality of life and status and health but patient feels that he constantly wants to get better.  No fever      OV 06/15/2018  Subjective:  Patient ID: Jason Summers, male , DOB: [birth year] , age 39 y.o. , MRN: [id number] , ADDRESS: [place]  [id number]   06/15/2018 -   Chief Complaint  Patient presents with   Follow-up    Since the dx of lung cancer 3 years ago, pt has has had more problems with his breathing and has to stop multiple times while walking to catch his breath. Pt has an occ cough but states he has been having a lot of headaches and fatigue. Pt has also been having problems with shaking and also unable to talk to people due the SOB.     HPI Kaivon Mulvey 85 y.o. -presents acutely with his wife.  Since I last saw him in September 2019 he has had worsening shortness of breath.  This is to the point now that when he climbs a flight of stairs that he is panting quite a bit.  He also has to stop while he climbs.  Normal exertion is now becoming difficult.  It takes a long time for him to recover to the point that he is not able to talk immediately after exertion.  At rest he feels fine.  The wife also states is significantly depressed.  He is asking for himself to be checked into a nursing home which the wife is reluctant to do.  He is having nonspecific headaches that are chronic in nature.  But when he bends his head down it gets worse.  They believe in the last few months this is also worse.  There is no chest pain or wheezing or pedal edema or hemoptysis or weight loss.  OV 06/25/2018  Subjective:  Patient ID: Jason Summers, male , DOB: 17-Dec-1931 , age 3 y.o. , MRN: [id number] , ADDRESS: [place] [facility] [id number]   06/25/2018 -   Chief Complaint  Patient presents with   Follow-up    Pt states he is about the same as last visit. SOB is about the same and pt is still fatigued.     HPI Jason Summers 85 y.o. -presents for follow-up after work-up.  He had CT  chest June 22, 2018 that I personally visualized.  The radiologist reporting presence of UIP with honeycombing.  I visualized this and agree with the findings.  He had echocardiogram June 22, 2018 that shows grade 1 diastolic dysfunction and slightly  elevated pulmonary artery systolic pressure with ejection fraction 55-60%.  In retrospect his CT chest from early 2019 shows ILD findings.  This corresponds with the time that he has been getting worse.  Walking desaturation test still shows adequate oxygenation but tendency to drop.  Compared to his last visit there are no new interim problems.  He is not interested in doing pulmonary rehabilitation.  At this point in time discussion focused on IPF diagnosis, prognosis and treatment.  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  He smoked in the remote past.  He did get chemo and radiation for his lung few to several years ago    OV 08/12/2018  Subjective:  Patient ID: Jason Summers, male , DOB: 28-Jan-1932 , age 75 y.o. , MRN: [id number] , ADDRESS: [place] [id number]   08/12/2018 -   Chief Complaint  Patient presents with   Follow-up    PFT attempted but pt was unable to complete. Pt states breathing has become worse since last visit. States he was wearing O2 at night but then began to become choked from it and had to stop wearing it. Pt also attempted to wear the O2 during the day as needed and had same problems with it. Pt also has had some chest tightness when he is unable to get his breath. Denies any complaints of cough.     HPI Jason Summers 85 y.o. -presents 5-year follow-up.  He presents with his wife.  He is now been on nintedanib since late January 2020.  He says he is tolerating it well although he has had several pound weight loss.  But other than this is not having any side effects from the nintedanib.  He was started on nighttime oxygen but he says he does not want to use it because the oxygen tubing is choking him.  His wife and he are very concerned about the level of shortness of breath.  They feel there is a decline in the last 1 year which we do know is from progressive ILD.  They do agree that it does not decline since early 2020/January 2020.  Nevertheless the symptom  burden is very high and this is documented below.  Wife states that he is very despondent and anxious and apparently this is his baseline.  He is also less active.  Wife states that he is always focused on the shortness of breath.  Multiple different questions about how to get better and move forward.  Questions about IPF prognosis.  Also with the upcoming pandemic of the coronavirus-asking for travel advice.  They do have family visiting from [place] and [place] by air.  His walking desaturation test today shows stability compared to the previous visit.   OV 12/09/2018  Subjective:  Patient ID: Jason Summers, male , DOB: 30-Oct-1931 , age 30 y.o. , MRN: [id number] , ADDRESS: [id number] [place] [facility] [id number]   12/09/2018 -   Chief Complaint  Patient presents with   Follow-up   IPF follow-up.  On nintedanib since late January 2020  HPI Jason Summers 85 y.o. -returns for follow-up with his wife.  Last seen 3 pandemic.  Since then his weight has been stable.  He was initially losing weight  on nintedanib.  Initially they told me that he has no side effects with nintedanib but then as I question further they tell me that for the last 1 month he has had abdominal bloating a few episodes of diarrhea some cramping all of moderate intensity.  But the bigger complaint is progressive shortness of breath despite nintedanib.  He cannot do pulmonary function test because of technical issues.  His last CT scan was in January 2020 that showed progression.  He and his wife are asking for a repeat CT scan of the chest.  He has been cleared for follow-up from a oncology perspective for his lung cancer from Dr. Julien Nordmann.  But apparently has a CT scans every 6 months.  His last echo was in January 2020 that showed diastolic dysfunction and elevated pulmonary systolic pressure he is heavily burdened by shortness of breath.  So far we have not been able to fix it or   OV 01/26/2019  Subjective:  Patient ID: Jason Summers,  male , DOB: 05-15-1932 , age 70 y.o. , MRN: [id number] , ADDRESS: [place] [id number]  IPF follow-up.  On nintedanib since late January 2020   01/26/2019 -  IPF followup after CT chest. Dyspnea worse. -Dyspnea felt to be multifactorial due to radiation related pleural effusion, lung cancer, IPF and also diastolic dysfunction associated with elevated pulmonary artery systolic pressure on echocardiogram 2020.   HPI Jason Summers 85 y.o. -presents with his wife.  Last visit was in July 2020.  He has had worsening dyspnea.  So we got a CT scan of the chest that shows stable pulmonary fibrosis compared to January 2020 but he tells me that even since July 2020 his dyspnea on exertion is worse.  He has told his wife that he is extremely worried that he will be found dead.  The CT scan also shows a T6 sclerotic lesion that and review of the previous radiology results have been present for at least over a year.  Given the redemonstration of this I discussed with his oncologist Dr. Julien Nordmann and he suggested getting a bone scan.  Patient is no longer actively following with Dr. Julien Nordmann because the cancer is believed to be under remission/control.  CT scan also the impression reported coronary artery calcifications.  Patient tells me that he has not had a cardiac stress test in over 10 years.  He does not have an active cardiologist.  Overall he is tolerating nintedanib quite well except for mild abdominal bloating and nausea which he is able to control.  He has not lost any weight.     CT chest 01/13/2019  IMPRESSION: 1. No significant interval change in moderate to severe pulmonary fibrosis in a pattern with apical to basal gradient featuring traction bronchiectasis, peripheral bronchiolectasis and honeycombing, most notable in the right lung base. Although not significantly changed when compared to immediate prior examination, findings are substantially and rapidly worsened on  sequential examinations over time dating back to 12/31/2015. Findings remain in a UIP pattern and consistent with clinical diagnosis of IPF.  2. No change in post treatment appearance of the left lower lobe. Unchanged small, loculated left pleural effusion.  3. Redemonstrated sclerotic wedge deformity of T6, which remains generally suspicious for osseous metastasis.  4. Coronary artery disease, aortic atherosclerosis, and aortic valve calcifications.   Electronically Signed   By: Eddie Candle M.D.   On: 01/13/2019 09:50 ROS - per HPI    OV 03/28/2019  Subjective:  Patient ID: Jason Summers, male , DOB: 1932/04/22 , age 68 y.o. , MRN: [id number] , ADDRESS: [id number] [place] [facility] [id number]   03/28/2019 -   Chief Complaint  Patient presents with   Follow-up    Taking OFev. He states he is still using the Mylanta that helps with his mild GI symptoms.      HPI Jason Summers 85 y.o. -presents for follow-up.  He has idiopathic pulmonary fibrosis in the history of lung cancer in the background for which he is on expectant follow-up with a CT scan monitoring under pulmonary.  He is out of proportion shortness of breath.  Last seen August 2020.  He had significant out of proportion shortness of breath.  Therefore referred him to cardiology.  He saw Dr. Marcelle Smiling in March 25, 2019.  It was felt that most of the shortness of breath was pulmonary and deconditioning anxiety related.  Expectant follow-up for cardiology related diastolic dysfunction was adopted.  It was felt this was age-related.  He also saw Dr. Julien Nordmann for his thoracic T6 spinal lesion.  It was felt this could be either malignant or traumatic related.  He declined kyphoplasty because his symptoms are really mild and they did not want to make things worse.  At this point in time he feels his IPF is stable.  He is tolerating his nintedanib quite well.  He has gained weight.  He has occasional mild abdominal pain for  which he takes Mylanta.  He is actually gaining weight.  However he tells me his shortness of breath continues to be worse.  His walking desaturation test on the other hand is stable.  His August 2020 CT scan was stable.  From a therapeutic monitoring standpoint he needs to have a liver function test today.  He has a new complaint of hoarseness of voice for the last 1 month.  It is present all the time not associated with dysphagia is mild to moderate in intensity.  No associated symptoms other than worsening shortness of breath.       SYMPTOM SCALE - ILD 08/12/2018 Weight 165# 12/09/2018 165# 03/28/2019 173# weight  O2 use ra ra RA - not done  Shortness of Breath 0 -> 5 scale with 5 being worst (score 6 If unable to do)    At rest 3    Simple tasks - showers, clothes change, eating, shaving 4 4   Household (dishes, doing bed, laundry) X -does not do. x   Shopping 3 x   Walking level at own pace 4    Walking keeping up with others of same age 26 5   Walking up Stairs 3 5   Walking up [place] 4 5   Total (40 - 48) Dyspnea Score 25    How bad is your cough? 0    How bad is your fatigue 4         Simple office walk 185 feet x  3 laps goal with forehead probe 10/28/2017  06/15/2018 [place] - 250 feet x 3 laps 06/25/2018 Weight 171# 08/12/2018 ofev x 4-6 weeks. Unable to do PFT - weight 165# 12/09/2018 Weight -  01/26/2019 Wt 168# 03/28/2019 weigit - 173#  O2 used Room  _0  rooom air  Number laps completed _1 las x 25o feet _2 Comments about pace Good pace normal normal normal normal    Resting Pulse Ox/HR 100%  and 83/min 97% and 87/min 98% and 96/min 99% and 83/min 99% and 96/min 99% 99% and 87/min  Final Pulse Ox/HR 97% and 96/min 91% and 101/mikn 90% and 110/min 91% and 102/min 92% and 116/min 90% and 117/min 92% and 99/min  Desaturated </= 88% _0   no  Desaturated <= 3% points yds Yes, 6 points Yes, 8 points Yes, 8 points  Yes, 7 points  Yes, 7 points  Got Tachycardic >/= 90/min _1   yes  Symptoms at end of test x Moderate dyspnea Mild dysnea Mild dyspnea Very dyspneic at last lap  Usual dyspnea  Miscellaneous comments x ? worse  Not using night o2 Not using o2  stable     ROS - per HPI     has a past medical history of Acute rhinitis (08/10/2017), Acute stress disorder (10/27/2007), Anxiety (07/14/2018), Arthritis, Bilateral epiphora (09/25/2015), Blush (09/23/2007), Cancer ([place]), Chest pain in adult (12/16/2016), Chronic right-sided thoracic back pain (12/16/2016), Compression fracture of body of thoracic vertebra ([place]) (02/09/2019), Dysphagia (07/17/2010), Eustachian tube dysfunction, bilateral (08/10/2017), Ganglion of joint (04/22/2007), Gastroesophageal reflux disease (05/15/2016), Headache (01/30/2016), Hemorrhoids, HEMORRHOIDS, INTERNAL (02/18/2007), IPF (idiopathic pulmonary fibrosis) ([place]) (07/14/2018), Lateral epicondylitis (tennis elbow), Lingular mass (09/07/2015), Mass of lingula of lung (09/07/2015), Mass of lower lobe of left lung (09/07/2015), Mediastinal adenopathy (09/07/2015), Mediastinal lymphadenopathy (08/27/2015), Multiple pulmonary nodules (08/27/2015), Nausea and vomiting (05/15/2016), Non-small cell cancer of left lung ([place]) (09/24/2015), Non-small cell lung cancer (NSCLC) ([place]) (dx'f 07/30/15), Nuclear sclerosis of right eye (09/25/2015), Odynophagia (11/06/2015), Osteoarthritis (07/17/2010), Persistent headaches (02/03/2017), Pleural effusion on left (08/14/2016), Pseudophakia, left eye (09/25/2015), Pulmonary emphysema ([place]) (08/14/2016), Radiation fibrosis of lung ([place]) (08/14/2016), Shortness of breath (08/14/2016), Soft tissue lesion of shoulder region (04/22/2007), Spondylosis of cervical joint, and SPRAIN AND STRAIN OF SACROILIAC (09/06/2008).   reports that he quit smoking about 8 years ago. His smoking use included cigarettes. He has a 12.40 pack-year smoking history. He has never used smokeless tobacco.  Past  Surgical History:  Procedure Laterality Date   COLONOSCOPY  2003   KNEE ARTHROSCOPY  2006   right    Allergies  Allergen Reactions   Gentamicin Other (See Comments)    Made eyes red, swollen, hot feeling   Loteprednol-Tobramycin Other (See Comments)    Other reaction(s): Other (See Comments) Made eyes Red, Swollen, Red, Warm feeling  Other reaction(s): Other (See Comments) Made eyes Red, Swollen, Red, Warm feeling  Made eyes Red, Swollen, Red, Warm feeling     Immunization History  Administered Date(s) Administered   Fluad Quad(high Dose 65+) 02/16/2019   Influenza Split 03/19/2011, 03/12/2012   Influenza Whole 06/09/1997, 04/22/2007, 03/14/2008, 03/28/2009, 05/09/2010   Influenza, High Dose Seasonal PF 04/14/2013, 04/11/2015, 03/02/2017, 03/23/2018   Influenza,inj,Quad PF,6+ Mos 05/07/2016   Influenza-Unspecified 04/10/2014   Pneumococcal Conjugate-13 10/14/2013   Pneumococcal Polysaccharide-23 06/09/2004, 10/04/2012   Td 06/09/2005   Zoster 02/04/2015    Family History  Problem Relation Age of Onset   Hypertension Mother    Heart disease Mother    Coronary artery disease Other      Current Outpatient Medications:    albuterol (PROVENTIL HFA;VENTOLIN HFA) 108 (90 Base) MCG/ACT inhaler, Inhale 2 puffs into the lungs every 6 (six) hours as needed for wheezing or shortness of breath., Disp: 1 Inhaler, Rfl: 6   calcium carbonate (TUMS - DOSED IN MG ELEMENTAL CALCIUM) 500 MG chewable tablet, Chew 1 tablet by mouth 2 (two) times daily as needed. , Disp: ,  Rfl:    esomeprazole (NEXIUM) 40 MG capsule, Take 1 capsule (40 mg total) by mouth daily., Disp: 90 capsule, Rfl: 3   glucosamine-chondroitin 500-400 MG tablet, Take 1 tablet by mouth daily. Reported on 09/11/2015, Disp: , Rfl:    OFEV 150 MG CAPS, Take 1 tablet by mouth 2 (two) times daily., Disp: , Rfl:    predniSONE (DELTASONE) 10 MG tablet, TAKE 1 1/2 TABLET BY MOUTH DAILY WITH BREAKFAST, Disp: 44  tablet, Rfl: 1   traZODone (DESYREL) 50 MG tablet, TAKE 2 TABLETS BY MOUTH AT BEDTIME, Disp: 180 tablet, Rfl: 0   umeclidinium bromide (INCRUSE ELLIPTA) 62.5 MCG/INH AEPB, Inhale 1 puff into the lungs daily., Disp: 1 each, Rfl: 6      Objective:   Vitals:   03/28/19 0926  BP: 132/80  Pulse: 83  Temp: (!) 97 F (36.1 C)  TempSrc: Temporal  SpO2: 94%  Weight: 173 lb 6.4 oz (78.7 kg)  Height: _0  (1.676 m)    Estimated body mass index is 27.99 kg/m as calculated from the following:   Height as of this encounter: _1  (1.676 m).   Weight as of this encounter: 173 lb 6.4 oz (78.7 kg).  _2 @  [employer]   03/28/19 0926  Weight: 173 lb 6.4 oz (78.7 kg)     Physical Exam  General Appearance:    Alert, cooperative, no distress, appears stated age - yes , Deconditioned looking - mild , OBESE  - no, Sitting on Wheelchair -  no  Head:    Normocephalic, without obvious abnormality, atraumatic  Eyes:    PERRL, conjunctiva/corneas clear,  Ears:    Normal TM's and external ear canals, both ears  Nose:   Nares normal, septum midline, mucosa normal, no drainage    or sinus tenderness. OXYGEN ON  - no . Patient is @ ra   Throat:   Lips, mucosa, and tongue normal; teeth and gums normal. Cyanosis on lips - no  Neck:   Supple, symmetrical, trachea midline, no adenopathy;    thyroid:  no enlargement/tenderness/nodules; no carotid   bruit or JVD  Back:     Symmetric, no curvature, ROM normal, no CVA tenderness  Lungs:     Distress - no , Wheeze no, Barrell Chest - no, Purse lip breathing - no, Crackles - yes at base   Chest Wall:    No tenderness or deformity.    Heart:    Regular rate and rhythm, S1 and S2 normal, no rub   or gallop, Murmur - no  Breast Exam:    NOT DONE  Abdomen:     Soft, non-tender, bowel sounds active all four quadrants,    no masses, no organomegaly. Visceral obesity - yes mild  Genitalia:   NOT DONE  Rectal:   NOT DONE  Extremities:    Extremities - normal, Has Cane - no, Clubbing - YES, Edema - noi  Pulses:   2+ and symmetric all extremities  Skin:   Stigmata of Connective Tissue Disease - no  Lymph nodes:   Cervical, supraclavicular, and axillary nodes normal  Psychiatric:  Neurologic:   Pleasant - yes, Anxious - yes, Flat affect - yes  CAm-ICU - neg, Alert and Oriented x 3 - yes, Moves all 4s - yes, Speech - normal, Cognition - intact           Assessment:       ICD-10-CM   1. IPF (idiopathic pulmonary fibrosis) ([place])  E70.350  2. Encounter for therapeutic drug monitoring  Z51.81   3. Pulmonary emphysema, unspecified emphysema type ([place])  J43.9   4. Hoarseness of voice  R49.0        Plan:     Patient Instructions  IPF (idiopathic pulmonary fibrosis) ([place]) with out of proportion shortness of breath - Plan: - seems stable on CT scan jan 2020 -> aug 2020 - ofev seems to be helping - do walk test 03/28/2019 in our office - no further PFT due to inability to do it - at followup do ILD symptom score and simple walk test   Encounter for therapeutic drug monitoring  - mild Gi symptoms with ofev for ipf. Otherwise tolerating it well   - Plan: - continue ofev with mylanta as needed - check LFT 03/28/2019   Pulmonary emphysema, unspecified emphysema type ([place]) - stable   Plan: continue incruse daily with daily prednisone. Albuterol as needed  Hoarseness of voice   - refer Dr [facility]ith [facility]  Shortness of breath - Coronary artery calcification seen on CAT scan -  [facility] cardiology did not think heart was contributing to shortness of breath  Abnormal computed tomography of thoracic spine  Non-small cell cancer of left lung ([place])   - Plan:agree with expectant approach laid out by [name]  Followup  - will call with results  - return in 3 months fpr clinical followup  - 5 min slot        SIGNATURE    Dr. Brand Males, M.D., F.C.C.P,  Pulmonary and Critical Care Medicine Staff  Physician, [place] Director - Interstitial Lung Disease  Program  Pulmonary [place] at [place], [place], [address]  Pager: [phone], If no answer or between  15:00h - 7:00h: call [phone] Telephone: [phone]  9:53 AM 03/28/2019

## 2019-03-28 NOTE — Patient Instructions (Addendum)
IPF (idiopathic pulmonary fibrosis) ([place]) with out of proportion shortness of breath - Plan: - seems stable on CT scan jan 2020 -> aug 2020 - ofev seems to be helping - do walk test 03/28/2019 in our office - no further PFT due to inability to do it - at followup do ILD symptom score and simple walk test   Encounter for therapeutic drug monitoring  - mild Gi symptoms with ofev for ipf. Otherwise tolerating it well   - Plan: - continue ofev with mylanta as needed - check LFT 03/28/2019   Pulmonary emphysema, unspecified emphysema type ([place]) - stable   Plan: continue incruse daily with daily prednisone. Albuterol as needed  Hoarseness of voice   - refer Dr [facility]  Shortness of breath - Coronary artery calcification seen on CAT scan -  [facility] cardiology did not think heart was contributing to shortness of breath  Abnormal computed tomography of thoracic spine  Non-small cell cancer of left lung ([place])   - Plan:agree with expectant approach laid out by [name]  Followup  - will call with results  - return in 3 months fpr clinical followup  - 30 min slot

## 2019-03-29 NOTE — Progress Notes (Signed)
Informed spouse Mickel Baas normal labs.

## 2019-03-31 ENCOUNTER — Other Ambulatory Visit: Payer: Self-pay | Admitting: Family Medicine

## 2019-03-31 ENCOUNTER — Encounter: Payer: Self-pay | Admitting: Family Medicine

## 2019-03-31 DIAGNOSIS — F419 Anxiety disorder, unspecified: Secondary | ICD-10-CM

## 2019-03-31 MED ORDER — TRAZODONE HCL 50 MG PO TABS: 100.0000 mg | ORAL_TABLET | Freq: Every day | ORAL | 0 refills | Status: DC

## 2019-04-08 ENCOUNTER — Other Ambulatory Visit: Payer: Self-pay

## 2019-04-08 MED ORDER — PREDNISONE 10 MG PO TABS: ORAL_TABLET | ORAL | 3 refills | Status: DC

## 2019-04-12 ENCOUNTER — Telehealth: Payer: Self-pay | Admitting: *Deleted

## 2019-04-12 ENCOUNTER — Telehealth: Payer: Self-pay | Admitting: Internal Medicine

## 2019-04-12 NOTE — Telephone Encounter (Signed)
Copied from [place] [id number]. Topic: Appointment Scheduling - Scheduling Inquiry for Clinic >> Apr 12, 2019  9:56 AM Rayann Heman wrote: Reason for CRM: pt wife called and stated that she would like a call back to see if pt can be worked in this year for a physical. Please advise

## 2019-04-12 NOTE — Telephone Encounter (Signed)
Medication name and strength: prednisone 65m  Provider: MR Pharmacy: [facility] on [address] Patient insurance ID:   Was the PA started on CMM?  Yes If yes, please enter the Key: [id number] for approval/denial: Up to 72hrs

## 2019-04-12 NOTE — Telephone Encounter (Signed)
Spoke with wife, let her know refill was just sent on 04/08/19 to their pharmacy  She also wanted MR to know Jason Summers went to see ENT and they should be sending records to MR to review.   Will route to MR to keep a look out for the report from Dr. Erik Obey

## 2019-04-12 NOTE — Telephone Encounter (Signed)
Ok to add him some where in December

## 2019-04-13 NOTE — Telephone Encounter (Signed)
Patient is scheduled for 12/9 at 8 AM

## 2019-04-18 ENCOUNTER — Telehealth: Payer: Self-pay | Admitting: Internal Medicine

## 2019-04-18 NOTE — Telephone Encounter (Signed)
Hi [name]  Sorry to bother. Another chart biopsy needed please. How long has Jason Summers beeon on chronic prednisone? And who first gave him this and why? My most recent note says continue for copd but  I am not sure I originally started the drug for this reason  Thanks  MR

## 2019-04-18 NOTE — Telephone Encounter (Signed)
[name] is returning call regarding results - please call at [phone] --

## 2019-04-18 NOTE — Telephone Encounter (Signed)
Spoke with the pt's spouse  She states that the pt was sent to [name] to have "throat test" and she is wanting these results  She states she is not getting answers when she calls Wolicki's office and they told her to call MR  Please advise if you have seen anything, thanks

## 2019-04-18 NOTE — Telephone Encounter (Signed)
LMTCB

## 2019-04-19 NOTE — Telephone Encounter (Signed)
Call returned to patient wife Jason Summers (dpr), made aware of MR recommendations. States she will call Dr. Noreene Filbert office directly.   Nothing further needed at this time.

## 2019-04-19 NOTE — Telephone Encounter (Signed)
Not seen anything. Might be a while because the paper work snakes through the office. Can she not check directly with him? He lives right across her house and she told me she knows him personally. If not, I can call him sometime this week and find out

## 2019-04-25 ENCOUNTER — Telehealth: Payer: Self-pay | Admitting: Internal Medicine

## 2019-04-25 ENCOUNTER — Encounter: Payer: Self-pay | Admitting: Internal Medicine

## 2019-04-25 ENCOUNTER — Ambulatory Visit (INDEPENDENT_AMBULATORY_CARE_PROVIDER_SITE_OTHER): Payer: Medicare Other | Admitting: Internal Medicine

## 2019-04-25 ENCOUNTER — Other Ambulatory Visit: Payer: Self-pay

## 2019-04-25 VITALS — BP 126/62 | HR 111 | Temp 98.1°F | Wt 171.6 lb

## 2019-04-25 DIAGNOSIS — Z7952 Long term (current) use of systemic steroids: Secondary | ICD-10-CM | POA: Diagnosis not present

## 2019-04-25 DIAGNOSIS — N5082 Scrotal pain: Secondary | ICD-10-CM | POA: Diagnosis not present

## 2019-04-25 LAB — POC URINALSYSI DIPSTICK (AUTOMATED)
Blood, UA: NEGATIVE
Glucose, UA: NEGATIVE
Ketones, UA: NEGATIVE
Leukocytes, UA: NEGATIVE
Nitrite, UA: NEGATIVE
Protein, UA: NEGATIVE
Spec Grav, UA: 1.025 (ref 1.010–1.025)
Urobilinogen, UA: 0.2 E.U./dL
pH, UA: 5.5 (ref 5.0–8.0)

## 2019-04-25 MED ORDER — SULFAMETHOXAZOLE-TRIMETHOPRIM 800-160 MG PO TABS: 1.0000 | ORAL_TABLET | Freq: Two times a day (BID) | ORAL | 0 refills | Status: DC

## 2019-04-25 NOTE — Progress Notes (Signed)
Chief Complaint  Patient presents with  . Groin Pain    Pt reports right testicle pain and groin pain pt having tenderness, redness and states it is now starting to move down his right leg. pt states this has been going on for about a week and getting worse    HPI: Jason Summers 85 y.o. come in for SDA  PCP NA  Her with wife   Began a week ago with medial inguinal groin pain and then inc redness and irritationat scrotum   Wife use some cream  But still bothersome and pain  progressive without fever NV dysuira    Radiates to leg ?     But no limpming   No  significant swelling . No hernia  No hx of same  Not a lot of swelling   No hx of same  No falling or limping    resp  Following  ROS: See pertinent positives and negatives per HPI. Says some nocturia that had in past and given medication that helped ? cannt tell me which one   Past Medical History:  Diagnosis Date  . Acute rhinitis 08/10/2017  . Acute stress disorder 10/27/2007   Qualifier: Diagnosis of  By: Arnoldo Morale MD, Balinda Quails   Overview:  Overview:  Qualifier: Diagnosis of  By: Arnoldo Morale MD, Balinda Quails  . Anxiety 07/14/2018  . Arthritis   . Bilateral epiphora 09/25/2015  . Blush 09/23/2007   Qualifier: Diagnosis of  By: Arnoldo Morale MD, Balinda Quails   Overview:  Overview:  Qualifier: Diagnosis of  By: Arnoldo Morale MD, [facility])    basal cell on right temple  . Chest pain in adult 12/16/2016  . Chronic right-sided thoracic back pain 12/16/2016  . Compression fracture of body of thoracic vertebra ([place]) 02/09/2019  . Dysphagia 07/17/2010   Qualifier: Diagnosis of  By: Trellis Paganini PA-c, Amy S   . Eustachian tube dysfunction, bilateral 08/10/2017  . Ganglion of joint 04/22/2007   Qualifier: Diagnosis of  By: Arnoldo Morale MD, Balinda Quails   . Gastroesophageal reflux disease 05/15/2016  . Headache 01/30/2016  . Hemorrhoids   . HEMORRHOIDS, INTERNAL 02/18/2007   Qualifier: Diagnosis of  By: Arnoldo Morale MD, Balinda Quails   . IPF (idiopathic pulmonary fibrosis) ([place]) 07/14/2018   06/22/2018-CT chest with contrast- spectrum of findings suggestive of basilar predominant fibrotic interstitial lung disease with mild honeycombing asymmetrically involving the right lung with significant progression since February/2019 in September/2019 chest CT.  Findings consistent with UIP.  06/22/2018-echocardiogram- LV ejection fraction 55 to 33%, grade 1 diastolic dysfunction, PA P pressure 4  . Lateral epicondylitis (tennis elbow)   . Lingular mass 09/07/2015  . Mass of lingula of lung 09/07/2015  . Mass of lower lobe of left lung 09/07/2015  . Mediastinal adenopathy 09/07/2015  . Mediastinal lymphadenopathy 08/27/2015  . Multiple pulmonary nodules 08/27/2015  . Nausea and vomiting 05/15/2016  . Non-small cell cancer of left lung ([place]) 09/24/2015   09/13/2015: Left lower lobe lung biopsy: Overall, the histologic features, in conjunction with the positive staining for cytokeratin 903, cytokeratin 5/6, and p63 support a diagnosis of poorly differentiated squamous cell carcinoma. There is likely sufficient tumor remaining for additional studies, if requested. (JBK:ds 09/17/15)     . Non-small cell lung cancer (NSCLC) ([place]) dx'f 07/30/15  . Nuclear sclerosis of right eye 09/25/2015  . Odynophagia 11/06/2015  . Osteoarthritis 07/17/2010   Qualifier: Diagnosis of  By: Trellis Paganini PA-c, Amy S   . Persistent headaches  02/03/2017  . Pleural effusion on left 08/14/2016  . Pseudophakia, left eye 09/25/2015  . Pulmonary emphysema ([place]) 08/14/2016  . Radiation fibrosis of lung ([place]) 08/14/2016  . Shortness of breath 08/14/2016  . Soft tissue lesion of shoulder region 04/22/2007   Qualifier: Diagnosis of  By: Arnoldo Morale MD, Balinda Quails   Overview:  Overview:  Qualifier: Diagnosis of  By: Arnoldo Morale MD, Balinda Quails  . Spondylosis of cervical joint   . SPRAIN AND STRAIN OF SACROILIAC 09/06/2008   Qualifier: Diagnosis of  By: Arnoldo Morale MD, Balinda Quails     Family History  Problem Relation Age of Onset  . Hypertension Mother   . Heart disease  Mother   . Coronary artery disease Other     Social History   Socioeconomic History  . Marital status: Married    Spouse name: Not on file  . Number of children: Not on file  . Years of education: Not on file  . Highest education level: Not on file  Occupational History  . Occupation: Retired     Comment: Animal nutritionist  . Financial resource strain: Not on file  . Food insecurity    Worry: Not on file    Inability: Not on file  . Transportation needs    Medical: Not on file    Non-medical: Not on file  Tobacco Use  . Smoking status: Former Smoker    Packs/day: 0.20    Years: 62.00    Pack years: 12.40    Types: Cigarettes    Quit date: 03/10/2011    Years since quitting: 8.1  . Smokeless tobacco: Never Used  Substance and Sexual Activity  . Alcohol use: Yes    Alcohol/week: 7.0 standard drinks    Types: 7 Glasses of wine per week    Comment: 1-2 glasses of wine a day  . Drug use: No  . Sexual activity: Yes  Lifestyle  . Physical activity    Days per week: Not on file    Minutes per session: Not on file  . Stress: Not on file  Relationships  . Social Herbalist on phone: Not on file    Gets together: Not on file    Attends religious service: Not on file    Active member of club or organization: Not on file    Attends meetings of clubs or organizations: Not on file    Relationship status: Not on file  Other Topics Concern  . Not on file  Social History Narrative  . Not on file    Outpatient Medications Prior to Visit  Medication Sig Dispense Refill  . albuterol (PROVENTIL HFA;VENTOLIN HFA) 108 (90 Base) MCG/ACT inhaler Inhale 2 puffs into the lungs every 6 (six) hours as needed for wheezing or shortness of breath. 1 Inhaler 6  . calcium carbonate (TUMS - DOSED IN MG ELEMENTAL CALCIUM) 500 MG chewable tablet Chew 1 tablet by mouth 2 (two) times daily as needed.     Marland Kitchen esomeprazole (NEXIUM) 40 MG capsule Take 1 capsule (40 mg total) by mouth  daily. 90 capsule 3  . glucosamine-chondroitin 500-400 MG tablet Take 1 tablet by mouth daily. Reported on 09/11/2015    . OFEV 150 MG CAPS Take 1 tablet by mouth 2 (two) times daily.    . predniSONE (DELTASONE) 10 MG tablet TAKE 1 1/2 TABLET BY MOUTH DAILY WITH BREAKFAST 44 tablet 3  . traZODone (DESYREL) 50 MG tablet Take 2 tablets (100 mg total)  by mouth at bedtime. 180 tablet 0  . umeclidinium bromide (INCRUSE ELLIPTA) 62.5 MCG/INH AEPB Inhale 1 puff into the lungs daily. 1 each 6   No facility-administered medications prior to visit.      EXAM:  BP 126/62 (BP Location: Right Arm, Patient Position: Sitting, Cuff Size: Normal)   Pulse (!) 111   Temp 98.1 F (36.7 C) (Temporal)   Wt 171 lb 9.6 oz (77.8 kg)   SpO2 91%   BMI 27.70 kg/m   Body mass index is 27.7 kg/m.  GENERAL: vitals reviewed and listed above, alert, oriented, appears well hydrated and in no acute distress mild hoarseness and inc resp  HEENT: atraumatic, conjunctiva  clear, no obvious abnormalities on inspection of external nose and earsNECK: no obvious masses on inspection palpation   MS: moves all extremities without noticeable focal  abnormality PSYCH: pleasant and cooperative, no obvious depression or anxiety Gait  Ok ok can stand on either leg and in and   Standing there is bright redness  On skin of scrotum but rugae  Are present  Not much edema    And minimal tender to touch    ? Cremasteric reflex but no pain with elevation    abd soft .  Inguinal canal puffy but no obv hernia .  No vesicles  Or weeping or edema  abd soft and no adenopathy   BP Readings from Last 3 Encounters:  04/25/19 126/62  03/28/19 132/80  03/25/19 120/68   poct ua negative  ASSESSMENT AND PLAN:  Discussed the following assessment and plan:  Scrotal pain - Plan: POCT Urinalysis Dipstick (Automated)  On prednisone therapy and redness without edema  Or mass effect  Insidious onset  Pain seems to be inguinal  But the positive   redness is scrotal  No streaking and no sig edema so atpyical for  Skin cellulitis and no evidence or urinary issues  At this time .  consdieration of referred pain but caue not obvious.  Close observatino and fu indicated  -Patient advised to return or notify health care team  if  new concerns arise.  Patient Instructions   I dont see a mass  Or lump  Noted  But  A lot of redness   We are treating this for   Infection  .   Loose underwear  And add antibiotic  And plan  Follow up with Tommi Rumps I this week  Expect some improvement in the next 2-3 days  Progress report and then follow up  After that .   We can ask him  about the   Bladder    Medication        Standley Brooking. Hersel Mcmeen M.D.

## 2019-04-25 NOTE — Patient Instructions (Addendum)
  I dont see a mass  Or lump  Noted  But  A lot of redness   We are treating this for   Infection  .   Loose underwear  And add antibiotic  And plan  Follow up with Tommi Rumps I this week  Expect some improvement in the next 2-3 days  Progress report and then follow up  After that .   We can ask him  about the   Bladder    Medication

## 2019-04-26 ENCOUNTER — Telehealth: Payer: Self-pay | Admitting: *Deleted

## 2019-04-26 NOTE — Telephone Encounter (Signed)
PA has been approved. Pharmacy is aware of approval.

## 2019-04-26 NOTE — Telephone Encounter (Signed)
Copied from [place] [id number]. Topic: General - Inquiry >> Apr 26, 2019  4:04 PM Richardo Priest, [place] wrote: Reason for CRM: [name] called in stating she would like husband on script to help with urination. Please advise.

## 2019-04-27 ENCOUNTER — Other Ambulatory Visit: Payer: Self-pay

## 2019-04-27 ENCOUNTER — Telehealth (INDEPENDENT_AMBULATORY_CARE_PROVIDER_SITE_OTHER): Payer: Medicare Other | Admitting: Adult Health

## 2019-04-27 DIAGNOSIS — N5082 Scrotal pain: Secondary | ICD-10-CM

## 2019-04-27 DIAGNOSIS — R351 Nocturia: Secondary | ICD-10-CM | POA: Diagnosis not present

## 2019-04-27 DIAGNOSIS — N401 Enlarged prostate with lower urinary tract symptoms: Secondary | ICD-10-CM

## 2019-04-27 MED ORDER — TAMSULOSIN HCL 0.4 MG PO CAPS: 0.4000 mg | ORAL_CAPSULE | Freq: Every day | ORAL | 3 refills | Status: DC

## 2019-04-27 NOTE — Telephone Encounter (Signed)
Spoke to Mrs. Bless and she stated the pt is having a hard time starting a urine stream.  Has had some difficulty in the past but worse lately.  No burning, itching or pain involved.  No abd pain.  Pt set to have a telephone call today with Tommi Rumps at 4 PM.  Nothing further needed.

## 2019-04-27 NOTE — Progress Notes (Signed)
Virtual Visit via Telephone Note  I connected with Jason Summers on 04/27/19 at  4:00 PM EST by telephone and verified that I am speaking with the correct person using two identifiers.   I discussed the limitations, risks, security and privacy concerns of performing an evaluation and management service by telephone and the availability of in person appointments. I also discussed with the patient that there may be a patient responsible charge related to this service. The patient expressed understanding and agreed to proceed.  Location patient: home Location provider: work or home office Participants present for the call: patient, provider, wife Patient did not have a visit in the prior 7 days to address this/these issue(s).   History of Present Illness: 85 year old male who is being evaluated today for symptoms of BPH.  In the past he was prescribed Flomax, but stopped taking this as he was no longer having issues.  Over the last month or so his symptoms come back.  He is experiencing nocturia, decreased stream, urgency, and incomplete bladder emptying.  He would like to go back on this medication.  Additionally he was seen 2 days ago by another provider in the office for inguinal pain and irritation to his scrotum..  There was no significant swelling or hernia.  His scrotum was red.  There was no mass or lump noted.  No clear cause of his symptoms and he was treated for cellulitis with Bactrim.  His wife today reports that there has been no improvement in his scrotum continues to be red.  Is taking antibiotics as directed.   Observations/Objective: Patient sounds cheerful and well on the phone. I do not appreciate any SOB. Speech and thought processing are grossly intact. Patient reported vitals:  Assessment and Plan: 1. BPH associated with nocturia  - tamsulosin (FLOMAX) 0.4 MG CAPS capsule; Take 1 capsule (0.4 mg total) by mouth daily.  Dispense: 90 capsule; Refill: 3  2. Scrotal  pain -If no improvement by tomorrow afternoon while on antibiotics I am going to have him follow-up in the office this week.  May need to consider referral to urology    Follow Up Instructions:  I did not refer this patient for an OV in the next 24 hours for this/these issue(s).  I discussed the assessment and treatment plan with the patient. The patient was provided an opportunity to ask questions and all were answered. The patient agreed with the plan and demonstrated an understanding of the instructions.   The patient was advised to call back or seek an in-person evaluation if the symptoms worsen or if the condition fails to improve as anticipated.  I provided 20 minutes of non-face-to-face time during this encounter.   [name], NP

## 2019-04-28 ENCOUNTER — Telehealth: Payer: Self-pay | Admitting: Internal Medicine

## 2019-04-28 NOTE — Telephone Encounter (Signed)
Called and spoke to pt's wife. Informed her of the information per MR. She verbalized understanding and also requests MR follow up with Dr. Julien Nordmann as well. [name] denied any further questions or concerns at this time.   Will forward to MR.

## 2019-04-28 NOTE — Telephone Encounter (Signed)
Please let Jason Hoskin' and wife know that I spoke to ENT - and he has a paralyzed vocal cord. This is making him extra short of breath and the voice issues. So, while I am glad we found a reason. I am learning that [name] spoke to [name] and there is nothing much to offer from a cancer perspective but I can followup with [name]  Thanks    SIGNATURE    Dr. Brand Males, M.D., F.C.C.P,  Pulmonary and Critical Care Medicine Staff Physician, [place] Director - Interstitial Lung Disease  Program  Pulmonary [place] at [place], [place], [address]  Pager: [phone], If no answer or between  15:00h - 7:00h: call [phone] Telephone: [phone]  8:51 AM 04/28/2019

## 2019-04-28 NOTE — Telephone Encounter (Signed)
Jason Summers

## 2019-04-29 ENCOUNTER — Telehealth: Payer: Self-pay | Admitting: Adult Health

## 2019-04-29 NOTE — Telephone Encounter (Signed)
Patient is still having testicle issues.  Patient's wife called and is wanting to know if Jason Summers wants to see him in office, that when they saw Jason Summers he acted like he wanted to see him in office if it doesn't get better.  Patient is requesting a call back today to discuss this.

## 2019-04-29 NOTE — Telephone Encounter (Signed)
Spoke to Jason Summers and advised of the below message.  Pt is scheduled for a physical in 2 weeks.  They would rather not come in unless needed.  She will call on Tuesday if pt is not improving and needs to be seen.  Nothing further needed.

## 2019-04-29 NOTE — Telephone Encounter (Signed)
Spoke to Mrs. [place].  Pt continues to have red testicles but the redness is better.  Started antibiotics and today makes the 3rd day.  Would like to know if the pt needs to make a follow up appointment.  Please advise.

## 2019-04-29 NOTE — Telephone Encounter (Signed)
If it is starting to improve the antibiotics are kicking in. Lets have him follow up on Tuesday

## 2019-05-02 ENCOUNTER — Other Ambulatory Visit: Payer: Self-pay | Admitting: Pulmonary Disease

## 2019-05-02 DIAGNOSIS — J84112 Idiopathic pulmonary fibrosis: Secondary | ICD-10-CM

## 2019-05-17 ENCOUNTER — Other Ambulatory Visit: Payer: Self-pay

## 2019-05-18 ENCOUNTER — Telehealth: Payer: Self-pay | Admitting: *Deleted

## 2019-05-18 ENCOUNTER — Ambulatory Visit: Payer: Self-pay

## 2019-05-18 ENCOUNTER — Ambulatory Visit (INDEPENDENT_AMBULATORY_CARE_PROVIDER_SITE_OTHER): Payer: Medicare Other | Admitting: Adult Health

## 2019-05-18 ENCOUNTER — Encounter: Payer: Self-pay | Admitting: Adult Health

## 2019-05-18 ENCOUNTER — Ambulatory Visit (INDEPENDENT_AMBULATORY_CARE_PROVIDER_SITE_OTHER): Payer: Medicare Other

## 2019-05-18 VITALS — BP 122/78 | Temp 97.2°F | Ht 67.75 in | Wt 171.0 lb

## 2019-05-18 DIAGNOSIS — N401 Enlarged prostate with lower urinary tract symptoms: Secondary | ICD-10-CM | POA: Diagnosis not present

## 2019-05-18 DIAGNOSIS — R1031 Right lower quadrant pain: Secondary | ICD-10-CM

## 2019-05-18 DIAGNOSIS — K219 Gastro-esophageal reflux disease without esophagitis: Secondary | ICD-10-CM

## 2019-05-18 DIAGNOSIS — Z0000 Encounter for general adult medical examination without abnormal findings: Secondary | ICD-10-CM

## 2019-05-18 DIAGNOSIS — G4700 Insomnia, unspecified: Secondary | ICD-10-CM

## 2019-05-18 DIAGNOSIS — J84112 Idiopathic pulmonary fibrosis: Secondary | ICD-10-CM

## 2019-05-18 DIAGNOSIS — R351 Nocturia: Secondary | ICD-10-CM

## 2019-05-18 DIAGNOSIS — C3492 Malignant neoplasm of unspecified part of left bronchus or lung: Secondary | ICD-10-CM

## 2019-05-18 LAB — COMPREHENSIVE METABOLIC PANEL
ALT: 18 U/L (ref 0–53)
AST: 14 U/L (ref 0–37)
Albumin: 4 g/dL (ref 3.5–5.2)
Alkaline Phosphatase: 59 U/L (ref 39–117)
BUN: 23 mg/dL (ref 6–23)
CO2: 31 mEq/L (ref 19–32)
Calcium: 9.2 mg/dL (ref 8.4–10.5)
Chloride: 102 mEq/L (ref 96–112)
Creatinine, Ser: 0.95 mg/dL (ref 0.40–1.50)
GFR: 75.27 mL/min (ref >60.00)
Glucose, Bld: 90 mg/dL (ref 70–99)
Potassium: 4.4 mEq/L (ref 3.5–5.1)
Sodium: 140 mEq/L (ref 135–145)
Total Bilirubin: 0.6 mg/dL (ref 0.2–1.2)
Total Protein: 6.7 g/dL (ref 6.0–8.3)

## 2019-05-18 LAB — POCT URINALYSIS DIPSTICK
Glucose, UA: NEGATIVE
Leukocytes, UA: NEGATIVE
Protein, UA: POSITIVE — AB
Spec Grav, UA: 1.025 (ref 1.010–1.025)
Urobilinogen, UA: 0.2 E.U./dL
pH, UA: 6 (ref 5.0–8.0)

## 2019-05-18 LAB — CBC WITH DIFFERENTIAL/PLATELET
Basophils Absolute: 0 10*3/uL (ref 0.0–0.1)
Basophils Relative: 0.6 % (ref 0.0–3.0)
Eosinophils Absolute: 0.1 10*3/uL (ref 0.0–0.7)
Eosinophils Relative: 1.2 % (ref 0.0–5.0)
HCT: 45.5 % (ref 39.0–52.0)
Hemoglobin: 15.1 g/dL (ref 13.0–17.0)
Lymphocytes Relative: 19.2 % (ref 12.0–46.0)
Lymphs Abs: 1.4 10*3/uL (ref 0.7–4.0)
MCHC: 33.2 g/dL (ref 30.0–36.0)
MCV: 91.8 fl (ref 78.0–100.0)
Monocytes Absolute: 0.7 10*3/uL (ref 0.1–1.0)
Monocytes Relative: 10.4 % (ref 3.0–12.0)
Neutro Abs: 4.9 10*3/uL (ref 1.4–7.7)
Neutrophils Relative %: 68.6 % (ref 43.0–77.0)
Platelets: 197 10*3/uL (ref 150.0–400.0)
RBC: 4.96 Mil/uL (ref 4.22–5.81)
RDW: 14.2 % (ref 11.5–15.5)
WBC: 7.1 10*3/uL (ref 4.0–10.5)

## 2019-05-18 LAB — LIPID PANEL
Cholesterol: 224 mg/dL — ABNORMAL HIGH (ref 0–200)
HDL: 67.5 mg/dL (ref >39.00)
LDL Cholesterol: 135 mg/dL — ABNORMAL HIGH (ref 0–99)
NonHDL: 156.94
Total CHOL/HDL Ratio: 3
Triglycerides: 108 mg/dL (ref 0.0–149.0)
VLDL: 21.6 mg/dL (ref 0.0–40.0)

## 2019-05-18 LAB — TSH: TSH: 2.71 u[IU]/mL (ref 0.35–4.50)

## 2019-05-18 NOTE — Progress Notes (Signed)
Subjective:    Patient ID: Jason Summers, male    DOB: 1932/02/15, 85 y.o.   MRN: [id number]  HPI Patient presents for yearly preventative medicine examination. He is a pleasant 85 year old male who  has a past medical history of Acute rhinitis (08/10/2017), Acute stress disorder (10/27/2007), Anxiety (07/14/2018), Arthritis, Bilateral epiphora (09/25/2015), Blush (09/23/2007), Cancer ([place]), Chest pain in adult (12/16/2016), Chronic right-sided thoracic back pain (12/16/2016), Compression fracture of body of thoracic vertebra ([place]) (02/09/2019), Dysphagia (07/17/2010), Eustachian tube dysfunction, bilateral (08/10/2017), Ganglion of joint (04/22/2007), Gastroesophageal reflux disease (05/15/2016), Headache (01/30/2016), Hemorrhoids, HEMORRHOIDS, INTERNAL (02/18/2007), IPF (idiopathic pulmonary fibrosis) ([place]) (07/14/2018), Lateral epicondylitis (tennis elbow), Lingular mass (09/07/2015), Mass of lingula of lung (09/07/2015), Mass of lower lobe of left lung (09/07/2015), Mediastinal adenopathy (09/07/2015), Mediastinal lymphadenopathy (08/27/2015), Multiple pulmonary nodules (08/27/2015), Nausea and vomiting (05/15/2016), Non-small cell cancer of left lung ([place]) (09/24/2015), Non-small cell lung cancer (NSCLC) ([place]) (dx'f 07/30/15), Nuclear sclerosis of right eye (09/25/2015), Odynophagia (11/06/2015), Osteoarthritis (07/17/2010), Persistent headaches (02/03/2017), Pleural effusion on left (08/14/2016), Pseudophakia, left eye (09/25/2015), Pulmonary emphysema ([place]) (08/14/2016), Radiation fibrosis of lung ([place]) (08/14/2016), Shortness of breath (08/14/2016), Soft tissue lesion of shoulder region (04/22/2007), Spondylosis of cervical joint, and SPRAIN AND STRAIN OF SACROILIAC (09/06/2008).  He is in the office alone today  H/o Non small cell cancer of left lung -followed by oncology every 6 months.  IPF -is followed by pulmonary, Dr. Chase Caller.  Currently prescribed Ofev,  Incruse Ellipta and daily prednisone therapy. Climbing stairs and walking  distances make him more SOB and it takes multiple minutes for him to catch his breath. He does not feel as though SOB has become worse recently. " Some days are good and some days are bad".   BPH -was restarted on Flomax approximately 1 month ago after he had stopped it because he was no longer having symptoms.  His symptoms came back which included decreased stream, urgency, incomplete bladder emptying, and nocturia.  Once restarting Flomax he reports that his symptoms are improving.  He is getting up less times to urinate and feels as though his urgency has decreased.  Continues to have a weak stream  Right Groin pain -has been present for the last few weeks.  He reports a sharp pain intermittently that radiates from the groin to his right gluteal muscle.  Pain is described as "sharp" he lasts a few seconds.  His pain is worse when laying in bed on his right side and when changing positions.  He does not feel as though his hip is giving out and does not affect ambulation.  Denies trauma or falls.  Was treated by another provider approximately 1 month ago for sided inguinal pain increased redness and irritation to scrotum.  He was treated with Bactrim.  Reports that the scrotal irritation has resolved.  Insomnia - he takes trazodone - sleeps pretty well.   All immunizations and health maintenance protocols were reviewed with the patient and needed orders were placed.  Appropriate screening laboratory values were ordered for the patient including screening of hyperlipidemia, renal function and hepatic function.   Medication reconciliation,  past medical history, social history, problem list and allergies were reviewed in detail with the patient  Goals were established with regard to weight loss, exercise, and  diet in compliance with medications  Wt Readings from Last 3 Encounters:  05/18/19 171 lb (77.6 kg)  04/25/19 171 lb 9.6 oz (77.8 kg)  03/28/19 173 lb 6.4 oz (78.7 kg)  End of life  planning was discussed.   Review of Systems  Constitutional: Negative.   HENT: Negative.   Eyes: Negative.   Respiratory: Positive for shortness of breath and wheezing.   Cardiovascular: Negative.   Gastrointestinal: Negative.   Endocrine: Negative.   Genitourinary: Negative.   Musculoskeletal: Positive for arthralgias.  Skin: Negative.   Allergic/Immunologic: Negative.   Neurological: Negative.   Hematological: Negative.   Psychiatric/Behavioral: Negative.   All other systems reviewed and are negative.  Past Medical History:  Diagnosis Date  . Acute rhinitis 08/10/2017  . Acute stress disorder 10/27/2007   Qualifier: Diagnosis of  By: Arnoldo Morale MD, Balinda Quails   Overview:  Overview:  Qualifier: Diagnosis of  By: Arnoldo Morale MD, Balinda Quails  . Anxiety 07/14/2018  . Arthritis   . Bilateral epiphora 09/25/2015  . 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 Chest pain in adult 12/16/2016  . Chronic right-sided thoracic back pain 12/16/2016  . Compression fracture of body of thoracic vertebra ([place]) 02/09/2019  . Dysphagia 07/17/2010   Qualifier: Diagnosis of  By: Trellis Paganini PA-c, Amy S   . Eustachian tube dysfunction, bilateral 08/10/2017  . Ganglion of joint 04/22/2007   Qualifier: Diagnosis of  By: Arnoldo Morale MD, Balinda Quails   . Gastroesophageal reflux disease 05/15/2016  . Headache 01/30/2016  . Hemorrhoids   . HEMORRHOIDS, INTERNAL 02/18/2007   Qualifier: Diagnosis of  By: Arnoldo Morale MD, Balinda Quails   . IPF (idiopathic pulmonary fibrosis) ([place]) 07/14/2018   06/22/2018-CT chest with contrast- spectrum of findings suggestive of basilar predominant fibrotic interstitial lung disease with mild honeycombing asymmetrically involving the right lung with significant progression since February/2019 in September/2019 chest CT.  Findings consistent with UIP.  06/22/2018-echocardiogram- LV  ejection fraction 55 to 67%, grade 1 diastolic dysfunction, PA P pressure 4  . Lateral epicondylitis (tennis elbow)   . Lingular mass 09/07/2015  . Mass of lingula of lung 09/07/2015  . Mass of lower lobe of left lung 09/07/2015  . Mediastinal adenopathy 09/07/2015  . Mediastinal lymphadenopathy 08/27/2015  . Multiple pulmonary nodules 08/27/2015  . Nausea and vomiting 05/15/2016  . Non-small cell cancer of left lung ([place]) 09/24/2015   09/13/2015: Left lower lobe lung biopsy: Overall, the histologic features, in conjunction with the positive staining for cytokeratin 903, cytokeratin 5/6, and p63 support a diagnosis of poorly differentiated squamous cell carcinoma. There is likely sufficient tumor remaining for additional studies, if requested. (JBK:ds 09/17/15)     . Non-small cell lung cancer (NSCLC) ([place]) dx'f 07/30/15  . Nuclear sclerosis of right eye 09/25/2015  . Odynophagia 11/06/2015  . Osteoarthritis 07/17/2010   Qualifier: Diagnosis of  By: Trellis Paganini PA-c, Amy S   . Persistent headaches 02/03/2017  . Pleural effusion on left 08/14/2016  . Pseudophakia, left eye 09/25/2015  . Pulmonary emphysema ([place]) 08/14/2016  . Radiation fibrosis of lung ([place]) 08/14/2016  . Shortness of breath 08/14/2016  . Soft tissue lesion of shoulder region 04/22/2007   Qualifier: Diagnosis of  By: Arnoldo Morale MD, Balinda Quails   Overview:  Overview:  Qualifier: Diagnosis of  By: Arnoldo Morale MD, Balinda Quails  . Spondylosis of cervical joint   . SPRAIN AND STRAIN OF SACROILIAC 09/06/2008   Qualifier: Diagnosis of  By: Arnoldo Morale MD, Balinda Quails     Social History  Socioeconomic History  . Marital status: Married    Spouse name: Not on file  . Number of children: Not on file  . Years of education: Not on file  . Highest education level: Not on file  Occupational History  . Occupation: Retired     Comment: Animal nutritionist  . Financial resource strain: Not on file  . Food insecurity    Worry: Not on file    Inability: Not on file  .  Transportation needs    Medical: Not on file    Non-medical: Not on file  Tobacco Use  . Smoking status: Former Smoker    Packs/day: 0.20    Years: 62.00    Pack years: 12.40    Types: Cigarettes    Quit date: 03/10/2011    Years since quitting: 8.1  . Smokeless tobacco: Never Used  Substance and Sexual Activity  . Alcohol use: Yes    Alcohol/week: 7.0 standard drinks    Types: 7 Glasses of wine per week    Comment: 1-2 glasses of wine a day  . Drug use: No  . Sexual activity: Yes  Lifestyle  . Physical activity    Days per week: Not on file    Minutes per session: Not on file  . Stress: Not on file  Relationships  . Social Herbalist on phone: Not on file    Gets together: Not on file    Attends religious service: Not on file    Active member of club or organization: Not on file    Attends meetings of clubs or organizations: Not on file    Relationship status: Not on file  . Intimate partner violence    Fear of current or ex partner: Not on file    Emotionally abused: Not on file    Physically abused: Not on file    Forced sexual activity: Not on file  Other Topics Concern  . Not on file  Social History Narrative  . Not on file    Past Surgical History:  Procedure Laterality Date  . COLONOSCOPY  2003  . KNEE ARTHROSCOPY  2006   right    Family History  Problem Relation Age of Onset  . Hypertension Mother   . Heart disease Mother   . Coronary artery disease Other     Allergies  Allergen Reactions  . Gentamicin Other (See Comments)    Made eyes red, swollen, hot feeling  . Loteprednol-Tobramycin Other (See Comments)    Other reaction(s): Other (See Comments) Made eyes Red, Swollen, Red, Warm feeling  Other reaction(s): Other (See Comments) Made eyes Red, Swollen, Red, Warm feeling  Made eyes Red, Swollen, Red, Warm feeling     Current Outpatient Medications on File Prior to Visit  Medication Sig Dispense Refill  . albuterol (PROVENTIL  HFA;VENTOLIN HFA) 108 (90 Base) MCG/ACT inhaler Inhale 2 puffs into the lungs every 6 (six) hours as needed for wheezing or shortness of breath. 1 Inhaler 6  . calcium carbonate (TUMS - DOSED IN MG ELEMENTAL CALCIUM) 500 MG chewable tablet Chew 1 tablet by mouth 2 (two) times daily as needed.     Marland Kitchen esomeprazole (NEXIUM) 40 MG capsule Take 1 capsule (40 mg total) by mouth daily. 90 capsule 3  . glucosamine-chondroitin 500-400 MG tablet Take 1 tablet by mouth daily. Reported on 09/11/2015    . INCRUSE ELLIPTA 62.5 MCG/INH AEPB USE 1 INHALATION DAILY 30 each 6  . OFEV 150  MG CAPS Take 1 tablet by mouth 2 (two) times daily.    . predniSONE (DELTASONE) 10 MG tablet TAKE 1 1/2 TABLET BY MOUTH DAILY WITH BREAKFAST 44 tablet 3  . sulfamethoxazole-trimethoprim (BACTRIM DS) 800-160 MG tablet Take 1 tablet by mouth 2 (two) times daily. 20 tablet 0  . tamsulosin (FLOMAX) 0.4 MG CAPS capsule Take 1 capsule (0.4 mg total) by mouth daily. 90 capsule 3  . traZODone (DESYREL) 50 MG tablet Take 2 tablets (100 mg total) by mouth at bedtime. 180 tablet 0   No current facility-administered medications on file prior to visit.     There were no vitals taken for this visit.      Objective:   Physical Exam Vitals signs and nursing note reviewed.  Constitutional:      General: He is not in acute distress.    Appearance: Normal appearance. He is well-developed. He is not diaphoretic.  HENT:     Head: Normocephalic and atraumatic.     Right Ear: Tympanic membrane, ear canal and external ear normal. There is no impacted cerumen.     Left Ear: Tympanic membrane, ear canal and external ear normal. There is no impacted cerumen.     Nose: Nose normal. No congestion or rhinorrhea.     Mouth/Throat:     Mouth: Mucous membranes are moist.     Pharynx: Oropharynx is clear. No oropharyngeal exudate.  Eyes:     General:        Right eye: No discharge.        Left eye: No discharge.     Conjunctiva/sclera: Conjunctivae  normal.     Pupils: Pupils are equal, round, and reactive to light.  Neck:     Thyroid: No thyromegaly.     Trachea: No tracheal deviation.  Cardiovascular:     Rate and Rhythm: Normal rate and regular rhythm.     Pulses: Normal pulses.     Heart sounds: Normal heart sounds. No murmur. No friction rub. No gallop.   Pulmonary:     Effort: Pulmonary effort is normal. No respiratory distress.     Breath sounds: Normal breath sounds. No stridor. No wheezing, rhonchi or rales.     Comments: Trace crackles throughout  Chest:     Chest wall: No tenderness.  Abdominal:     General: Bowel sounds are normal. There is no distension.     Palpations: Abdomen is soft. There is no mass.     Tenderness: There is no abdominal tenderness. There is no right CVA tenderness, left CVA tenderness, guarding or rebound.     Hernia: No hernia is present. There is no hernia in the left inguinal area or right inguinal area.  Genitourinary:    Pubic Area: No rash.      Scrotum/Testes: Normal.  Musculoskeletal: Normal range of motion.        General: No swelling, tenderness, deformity or signs of injury.     Right lower leg: No edema.     Left lower leg: No edema.     Comments: No pain or tenderness with palpation to the right groin, straight leg raise, knee-to-chest, internal or external rotation.  Lymphadenopathy:     Cervical: No cervical adenopathy.  Skin:    General: Skin is warm and dry.     Capillary Refill: Capillary refill takes less than 2 seconds.     Coloration: Skin is not jaundiced or pale.     Findings: No bruising, erythema, lesion or  rash.  Neurological:     General: No focal deficit present.     Mental Status: He is alert and oriented to person, place, and time.     Cranial Nerves: No cranial nerve deficit.     Coordination: Coordination normal.  Psychiatric:        Mood and Affect: Mood normal.        Behavior: Behavior normal.        Thought Content: Thought content normal.         Judgment: Judgment normal.       Assessment & Plan:  1. Routine general medical examination at a health care facility - Follow up in one year or sooner if needed - CBC with Differential/Platelet - CMP - Lipid panel - TSH  2. BPH associated with nocturia - Improving with Flomax. Continue with 0.4 mg   3. Gastroesophageal reflux disease without esophagitis - Continue with Nexium   4. Insomnia, unspecified type - Continue with Trazodone   5. IPF (idiopathic pulmonary fibrosis) ([place]) - Follow up with Pulmonary as directed  6. Non-small cell cancer of left lung ([place]) - Follow up with Oncology and Pulmonary as directed  7. Right groin pain - Unknown etiology, likely MSK.  - DG Hip Unilat W OR W/O Pelvis 2-3 Views Right; Future - POC Urinalysis Dipstick - DG Hip Unilat W OR W/O Pelvis 2-3 Views Right   [name], NP   This visit occurred during the SARS-CoV-2 public health emergency.  Safety protocols were in place, including screening questions prior to the visit, additional usage of staff PPE, and extensive cleaning of exam room while observing appropriate contact time as indicated for disinfecting solutions.

## 2019-05-18 NOTE — Telephone Encounter (Signed)
Noted.

## 2019-05-18 NOTE — Telephone Encounter (Signed)
Left a message for a return call.

## 2019-05-18 NOTE — Telephone Encounter (Signed)
Copied from [place] [id number]. Topic: General - Other >> May 18, 2019 12:50 PM Leward Quan A wrote: Reason for CRM: Pateint wife called to ask that the nurse of Dr Tommi Rumps give her a call back to discuss her husband appointment for this morning. She states that he is not able to give any details except that there were Xrays done but she want to talk to the nurse or doctor about the visit. Please call her at Ph#   [phone]

## 2019-05-18 NOTE — Patient Instructions (Addendum)
It was great seeing you today   I will follow up with you regarding your blood work and xray   Please let me know if you need anything   I hope you and Mickel Baas have a very happy holiday season!

## 2019-05-18 NOTE — Telephone Encounter (Signed)
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 States he was fasting.  Understands that he needs to be rechecked in 3 months.  Wife voices understanding.

## 2019-05-19 NOTE — Telephone Encounter (Signed)
Spoke to Jason Summers and she is concerned about x-ray result.  Advised that I do not have the result at this time but will call when available.  Nothing further needed.

## 2019-06-23 ENCOUNTER — Telehealth: Payer: Self-pay | Admitting: Internal Medicine

## 2019-06-23 NOTE — Telephone Encounter (Signed)
Called and spoke to pt's wife. She is questioning if pt should get the covid vaccine. Advised her that due to the pt's age and co-morbidities that he is at high risk of complications if he were to contract COVID-19. I informed her that the vaccine has shown great efficacy and it would be beneficial for pt to get the vaccine. [name] verbalized understanding and denied any further questions or concerns at this time.   Will forward to MR as FYI.

## 2019-06-27 ENCOUNTER — Telehealth: Payer: Self-pay

## 2019-06-27 NOTE — Telephone Encounter (Signed)
Checked cover my meds and saw that pt's pa received unfavorable results:  Your request has been denied CaseId:[id number];Status:Denied;Review Type:Prior Auth;Appeal Information: [place] [id number]. [phone] [phone] Fax:[phone] WebAddress:[email or web address]; Important - Please read the below note on eAppeals: Please reference the denial letter for information on the rights for an appeal, rationale for the denial, and how to submit an appeal including if any information is needed to support the appeal. Note about urgent situations - Generally, an urgent situation is one which, in the opinion of the provider, the health of the patient may be in serious jeopardy or may experience pain that cannot be adequately controlled while waiting for a decision on the appeal.;  Rachael, is there any way you can look into this for [place]?

## 2019-06-27 NOTE — Telephone Encounter (Signed)
We can look into this. When Denial letter is received, can you please place in pharmacy team folder?

## 2019-06-27 NOTE — Telephone Encounter (Signed)
PA request received from [facility] requested: Ofev 123m capsules CMM Key: [id number] request has been sent to plan, and a determination is expected within 3 days.   Routing to EMendeltnafor follow-up.

## 2019-06-29 NOTE — Telephone Encounter (Signed)
Received a fax regarding Prior Authorization from [employer] for Jason Summers. Authorization has been DENIED because there was not confirmation that the patient has a forced vital capacity (FVC) greater or equal to 40 percent of predicted value.  Will send document to scan center.  Phone# [phone]

## 2019-06-30 ENCOUNTER — Other Ambulatory Visit: Payer: Self-pay

## 2019-06-30 ENCOUNTER — Ambulatory Visit (INDEPENDENT_AMBULATORY_CARE_PROVIDER_SITE_OTHER): Payer: Medicare Other | Admitting: Internal Medicine

## 2019-06-30 ENCOUNTER — Telehealth: Payer: Self-pay | Admitting: General Surgery

## 2019-06-30 ENCOUNTER — Encounter: Payer: Self-pay | Admitting: Internal Medicine

## 2019-06-30 VITALS — BP 128/70 | HR 96 | Temp 97.6°F | Ht 66.0 in | Wt 173.0 lb

## 2019-06-30 DIAGNOSIS — J84112 Idiopathic pulmonary fibrosis: Secondary | ICD-10-CM

## 2019-06-30 DIAGNOSIS — Z5181 Encounter for therapeutic drug level monitoring: Secondary | ICD-10-CM | POA: Diagnosis not present

## 2019-06-30 DIAGNOSIS — R0602 Shortness of breath: Secondary | ICD-10-CM

## 2019-06-30 DIAGNOSIS — J439 Emphysema, unspecified: Secondary | ICD-10-CM | POA: Diagnosis not present

## 2019-06-30 DIAGNOSIS — C3492 Malignant neoplasm of unspecified part of left bronchus or lung: Secondary | ICD-10-CM

## 2019-06-30 LAB — HEPATIC FUNCTION PANEL
ALT: 15 U/L (ref 0–53)
AST: 15 U/L (ref 0–37)
Albumin: 3.9 g/dL (ref 3.5–5.2)
Alkaline Phosphatase: 59 U/L (ref 39–117)
Bilirubin, Direct: 0.1 mg/dL (ref 0.0–0.3)
Total Bilirubin: 0.5 mg/dL (ref 0.2–1.2)
Total Protein: 6.7 g/dL (ref 6.0–8.3)

## 2019-06-30 NOTE — Telephone Encounter (Signed)
Please see the update below from the pharmacy team. Sent as FYI.

## 2019-06-30 NOTE — Progress Notes (Addendum)
IOV 12/16/2016  Chief Complaint  Patient presents with  . Acute Visit    Pt c/o chest tightness, mid back pain, and right lateral rib pain when pt takes a deep breath in. Pt also c/o mild dry cough with some chest congestion. Pt denies f/c/s.     86 year old male with stage III non-small cell lung cancer and resultant radiation related pleural effusion. This supposed to go to [place] his native country in June 2018 but he canceled because of his declining health. He tells me that his functional status is pretty good. His appetite is pretty good. However he is having worsening lower thoracic back pain. He saw my colleague approximately one month ago. Chest x-ray was unchanged. He was advised nonsteroidal anti-inflammatory drugs and Tylenol for pain relief. But now the pain has broken through. The pain is worse. It is present in the midthoracic right infra-axillary and lower anterior chest area. It gets worse with inspiration. It is severe. He feels he might need something stronger for pain relief. There is no neurologic deficit. There is no constipation. His effort tolerance is okay with there is slight increased fatigue.   OV 03/27/2017  Chief Complaint  Patient presents with  . Follow-up    Pt still has some pain in his back but it is better than it ws at last visit. Pt has noticed that he will occ. lose his voice and he has become weak, especially in the mornings. [name] states that pt has been taking some OTC for a cough and states that occ. it will be hard for him to breathe and does have occ. CP.    86 year old male with non-small cell lung cancer on observation therapy. I last saw him in July 2018. At that time he was bothered by left-sided chest pain. There was concern about cancer recurrence but a PET scan at that time showed improvement. Since then he's been on observation therapy.He now presents with his wife. He tells me that the pain is not much of an issue. He only has very  mild shortness of breath. But they both tell me that he is extremely fatigued. Particularly early in the morning. He has insomnia. Wife does think that he is deconditioned. Last lab work in August 2018 was fine. Has no symptoms of COPD exacerbation. He is up-to-date with his flu shot.he has had some headaches and neurology for that He might be depressed Walking desaturation test 185 feet 3 laps on room air: He finished it without any problems other than mild shortness of breath. Resting heart rate was 87/m. Final heart rate was 98/m. Resting pulse ox 100% final pulse ox 98%.   OV 06/29/2017  Chief Complaint  Patient presents with  . Follow-up    Pt and his wife states he is doing much better since last visit. Pt has been going to PT which has helped him gain strengh back, walking better, and brething better.   He has mild emphysema and this is follow-up for that.  He is here with his wife.  They tell me that he is not taking his inhalers.  Because it really does not benefit him.  The bigger issues that have been recently dealing with other issues of fatigue and low motivation and physical deconditioning.  Therefore at last visit we put him into pulmonary rehabilitation.  We checked a lot of his blood work it ended up that his vitamin D was very low.  Put him on vitamin D  replacement.  Now he reports that with both vitamin D replacement and with pulmonary rehabilitation he has significant improvement in his fatigue and energy levels.  He feels back to baseline.  In particular tl pulmonary rehabilitation has helped him.  They both are worried about the extent of lung cancer and the odds of recurrence.  The having follow-up CT scan with Dr. Julien Nordmann in a month or 2.  They feel that if his physical condition will allow and the CT scan does not show any recurrence he would want to visit [place] in the summer 2019.  Otherwise feeling good.   OV 10/28/2017  Chief Complaint  Patient presents with  . Follow-up     Pt states in the mornings it is hard to breathe. States he believes the fatigue has become better.   Jason Summers presents for follow-up with his wife.  The issues to be discussed are emphysema, shortness of breath and fatigue with vitamin D deficiency in the setting of lung cancer on observation therapy.  In addition there is travel advice coming up in 2 weeks he is going to [place] for a few months.  Overall stable.  He is no longer doing pulmonary rehab.  He uses only albuterol as needed because he does not want to do maintenance inhalers.  We told him to do his vitamin D once a month but I am not so sure as to what regimen he is following right now.  In the interim it appears that exertional dyspnea slightly worse although the fatigue component might be the same.  Wife feels is because he is anxious and because he wants to do more than his body is allowing him to do.  He does not follow the pulmonary rehabilitation home prescription accurately.  I reviewed the old chart found that Dr. Julien Nordmann has placed him on observation therapy after seeing him in March 2019.  Apparently there is a scan coming up in September 2019.  He clearly wants to be able to do more.  Walking desaturation test he got tachycardic and did not desaturate below 97%.  He is going to [place] early part of June 2019.  Is a long flight to [place].  Given the persistence of his dyspnea we did a deep dive with an objective dyspnea questionnaire.  It appears that the worst his dyspnea is level 3 out of 5.  He is level 3 for walking on a level with others of his age and walking uphill, picking up and straightening.  He is a level 2 dyspnea walking upstairs and washing the car.  He is a level 1 dyspnea for dressing watering the lawn sexual activities.  But is a level 0 dyspnea while eating or standing up from a chair of brushing teeth or showering or shaving.  It is associated with fatigue and arthralgia     DIAGNOSIS: Stage IIIA (T3, N2,  M0) non-small cell lung cancer, poorly differentiated squamous cell carcinoma diagnosed in February 2017 with PDL 1 expression of 35%, presented with large left lower lobe lung mass in addition to left upper lobe lung nodule and mediastinal lymphadenopathy. PRIOR THERAPY: Course of concurrent chemoradiation with weekly carboplatin for AUC of 2 and paclitaxel 45 MG/M2. Status post 7 cycles, last dose was given 11/19/2015 with partial response.  CURRENT THERAPY: Observation.       OV 03/03/2018  Subjective:  Patient ID: Jason Summers, male , DOB: 07-09-1931 , age 66 y.o. , MRN: [id number] , ADDRESS: [address]  [place] [address]   03/03/2018 -   Chief Complaint  Patient presents with  . Follow-up    SOB with exertion, still on Spiriva and feels it is working      HPI [employer] 86 y.o. -follow-up for shortness of breath related to radiation fibrosis of the lung, mild emphysema and physical deconditioning.  After last visit in May 2019 we started him on Spiriva.  He says this helped only a little bit.  He still has fixed dyspnea on exertion relieved by rest.  He was in [place] and this limited his exertion at various places.  However he says overall he is stable since last visit.  He is definitely not worse.  No associated chest pain or wheezing or cough.  He had a CT scan of the chest September 2019 that I personally visualized and agree with the report that this shows radiation related changes but no active cancer.  Also for the last few months he is on and off hoarseness of voice with variable tone in his voice.  Wife thinks is because of allergies.  Wife feels that patient has fixed issues and needs to accept his quality of life and status and health but patient feels that he constantly wants to get better.  No fever      OV 06/15/2018  Subjective:  Patient ID: Jason Summers, male , DOB: [birth year] , age 67 y.o. , MRN: [id number] , ADDRESS: [place]  [id number]   06/15/2018 -   Chief Complaint  Patient presents with  . Follow-up    Since the dx of lung cancer 3 years ago, pt has has had more problems with his breathing and has to stop multiple times while walking to catch his breath. Pt has an occ cough but states he has been having a lot of headaches and fatigue. Pt has also been having problems with shaking and also unable to talk to people due the SOB.     HPI Barbara Lamere 86 y.o. -presents acutely with his wife.  Since I last saw him in September 2019 he has had worsening shortness of breath.  This is to the point now that when he climbs a flight of stairs that he is panting quite a bit.  He also has to stop while he climbs.  Normal exertion is now becoming difficult.  It takes a long time for him to recover to the point that he is not able to talk immediately after exertion.  At rest he feels fine.  The wife also states is significantly depressed.  He is asking for himself to be checked into a nursing home which the wife is reluctant to do.  He is having nonspecific headaches that are chronic in nature.  But when he bends his head down it gets worse.  They believe in the last few months this is also worse.  There is no chest pain or wheezing or pedal edema or hemoptysis or weight loss.  OV 06/25/2018  Subjective:  Patient ID: Jason Summers, male , DOB: 1932-05-04 , age 8 y.o. , MRN: [id number] , ADDRESS: [facility] [address]   06/25/2018 -   Chief Complaint  Patient presents with  . Follow-up    Pt states he is about the same as last visit. SOB is about the same and pt is still fatigued.     HPI Chaske Manetta 86 y.o. -presents for follow-up after work-up.  He had CT chest June 22, 2018 that I personally visualized.  The radiologist reporting presence of UIP with honeycombing.  I visualized this and agree with the findings.  He had echocardiogram June 22, 2018 that shows grade 1 diastolic dysfunction and slightly  elevated pulmonary artery systolic pressure with ejection fraction 55-60%.  In retrospect his CT chest from early 2019 shows ILD findings.  This corresponds with the time that he has been getting worse.  Walking desaturation test still shows adequate oxygenation but tendency to drop.  Compared to his last visit there are no new interim problems.  He is not interested in doing pulmonary rehabilitation.  At this point in time discussion focused on IPF diagnosis, prognosis and treatment.  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  He smoked in the remote past.  He did get chemo and radiation for his lung few to several years ago    OV 08/12/2018  Subjective:  Patient ID: Jason Summers, male , DOB: 05-09-1932 , age 66 y.o. , MRN: [id number] , ADDRESS: [place] [id number]   08/12/2018 -   Chief Complaint  Patient presents with  . Follow-up    PFT attempted but pt was unable to complete. Pt states breathing has become worse since last visit. States he was wearing O2 at night but then began to become choked from it and had to stop wearing it. Pt also attempted to wear the O2 during the day as needed and had same problems with it. Pt also has had some chest tightness when he is unable to get his breath. Denies any complaints of cough.     HPI Jason Summers 86 y.o. -presents 5-year follow-up.  He presents with his wife.  He is now been on nintedanib since late January 2020.  He says he is tolerating it well although he has had several pound weight loss.  But other than this is not having any side effects from the nintedanib.  He was started on nighttime oxygen but he says he does not want to use it because the oxygen tubing is choking him.  His wife and he are very concerned about the level of shortness of breath.  They feel there is a decline in the last 1 year which we do know is from progressive ILD.  They do agree that it does not decline since early 2020/January 2020.  Nevertheless the symptom  burden is very high and this is documented below.  Wife states that he is very despondent and anxious and apparently this is his baseline.  He is also less active.  Wife states that he is always focused on the shortness of breath.  Multiple different questions about how to get better and move forward.  Questions about IPF prognosis.  Also with the upcoming pandemic of the coronavirus-asking for travel advice.  They do have family visiting from [place] and [place] by air.  His walking desaturation test today shows stability compared to the previous visit.   OV 12/09/2018  Subjective:  Patient ID: Jason Summers, male , DOB: 1932-03-31 , age 15 y.o. , MRN: [id number] , ADDRESS: [id number] [place] [facility] [address]   12/09/2018 -   Chief Complaint  Patient presents with  . Follow-up   IPF follow-up.  On nintedanib since late January 2020  HPI Jason Summers 86 y.o. -returns for follow-up with his wife.  Last seen 3 pandemic.  Since then his weight has been stable.  He was initially losing weight on nintedanib.  Initially they told me that he has no side effects with nintedanib but then as I question further they tell me that for the last 1 month he has had abdominal bloating a few episodes of diarrhea some cramping all of moderate intensity.  But the bigger complaint is progressive shortness of breath despite nintedanib.  He cannot do pulmonary function test because of technical issues.  His last CT scan was in January 2020 that showed progression.  He and his wife are asking for a repeat CT scan of the chest.  He has been cleared for follow-up from a oncology perspective for his lung cancer from Dr. Julien Nordmann.  But apparently has a CT scans every 6 months.  His last echo was in January 2020 that showed diastolic dysfunction and elevated pulmonary systolic pressure he is heavily burdened by shortness of breath.  So far we have not been able to fix it or   OV 01/26/2019  Subjective:  Patient ID: Jason Summers,  male , DOB: 03-21-1932 , age 17 y.o. , MRN: [id number] , ADDRESS: [place] [address]  IPF follow-up.  On nintedanib since late January 2020   01/26/2019 -  IPF followup after CT chest. Dyspnea worse. -Dyspnea felt to be multifactorial due to radiation related pleural effusion, lung cancer, IPF and also diastolic dysfunction associated with elevated pulmonary artery systolic pressure on echocardiogram 2020.   HPI Jason Summers 86 y.o. -presents with his wife.  Last visit was in July 2020.  He has had worsening dyspnea.  So we got a CT scan of the chest that shows stable pulmonary fibrosis compared to January 2020 but he tells me that even since July 2020 his dyspnea on exertion is worse.  He has told his wife that he is extremely worried that he will be found dead.  The CT scan also shows a T6 sclerotic lesion that and review of the previous radiology results have been present for at least over a year.  Given the redemonstration of this I discussed with his oncologist Dr. Julien Nordmann and he suggested getting a bone scan.  Patient is no longer actively following with Dr. Julien Nordmann because the cancer is believed to be under remission/control.  CT scan also the impression reported coronary artery calcifications.  Patient tells me that he has not had a cardiac stress test in over 10 years.  He does not have an active cardiologist.  Overall he is tolerating nintedanib quite well except for mild abdominal bloating and nausea which he is able to control.  He has not lost any weight.     CT chest 01/13/2019  IMPRESSION: 1. No significant interval change in moderate to severe pulmonary fibrosis in a pattern with apical to basal gradient featuring traction bronchiectasis, peripheral bronchiolectasis and honeycombing, most notable in the right lung base. Although not significantly changed when compared to immediate prior examination, findings are substantially and rapidly worsened on  sequential examinations over time dating back to 12/31/2015. Findings remain in a UIP pattern and consistent with clinical diagnosis of IPF.  2. No change in post treatment appearance of the left lower lobe. Unchanged small, loculated left pleural effusion.  3. Redemonstrated sclerotic wedge deformity of T6, which remains generally suspicious for osseous metastasis.  4. Coronary artery disease, aortic atherosclerosis, and aortic valve calcifications.   Electronically Signed   By: Eddie Candle M.D.   On: 01/13/2019 09:50 ROS - per HPI    OV 03/28/2019  Subjective:  Patient ID: Jason Plane  Summers, male , DOB: 08/04/1931 , age 44 y.o. , MRN: [id number] , ADDRESS: [place] [id number]   03/28/2019 -   Chief Complaint  Patient presents with  . Follow-up    Taking OFev. He states he is still using the Mylanta that helps with his mild GI symptoms.    Idiopathic pulmonary fibrosis  Multifactorial dyspnea  -IPF -Vocal cord paralysis diagnosed 2020 unilateral -Diastolic dysfunction -Anxiety/depression -Lung cancer status post radiation and expectant follow-up  HPI Jason Summers 86 y.o. -presents for follow-up.  He has idiopathic pulmonary fibrosis in the history of lung cancer in the background for which he is on expectant follow-up with a CT scan monitoring under pulmonary.  He is out of proportion shortness of breath.  Last seen August 2020.  He had significant out of proportion shortness of breath.  Therefore referred him to cardiology.  He saw Dr. Marcelle Smiling in March 25, 2019.  It was felt that most of the shortness of breath was pulmonary and deconditioning anxiety related.  Expectant follow-up for cardiology related diastolic dysfunction was adopted.  It was felt this was age-related.  He also saw Dr. Julien Nordmann for his thoracic T6 spinal lesion.  It was felt this could be either malignant or traumatic related.  He declined kyphoplasty because his symptoms are  really mild and they did not want to make things worse.  At this point in time he feels his IPF is stable.  He is tolerating his nintedanib quite well.  He has gained weight.  He has occasional mild abdominal pain for which he takes Mylanta.  He is actually gaining weight.  However he tells me his shortness of breath continues to be worse.  His walking desaturation test on the other hand is stable.  His August 2020 CT scan was stable.  From a therapeutic monitoring standpoint he needs to have a liver function test today.  He has a new complaint of hoarseness of voice for the last 1 month.  It is present all the time not associated with dysphagia is mild to moderate in intensity.  No associated symptoms other than worsening shortness of breath.       OV 06/30/2019  Subjective:  Patient ID: Jason Summers, male , DOB: 12/21/1931 , age 40 y.o. , MRN: [id number] , ADDRESS: [id number] [place] [address]  Idiopathic pulmonary fibrosis  - on ofev late Jan 2020 -Last CT scan of the chest August 2020  Multifactorial dyspnea  -IPF - Associated mild emphysema - on incruse and - Daily prednisome since May 2020 Wyn Quaker, FNP started prednisone taper in March 2020 for potential IPF flare with myriad of symptoms of shortness of breath. Patient continued to have dyspena. He reported improvement with prednisone use and Wyn Quaker, FNP placed patient on predn isone daily indefinitely in May 2020) -Vocal cord paralysis diagnosed 2020 Oct/nov  unilateral - [name] - on expectant followup -Diastolic dysfunction - mild on echo Jan 2020  - Mild Pulm Htn on echo Jan 2020 - Pulmonary arteries: Systolic pressure was mildly increased. PA   peak pressure: 45 mm Hg  -Anxiety/depression -Lung cancer status post radiation and expectant follow-up - DIAGNOSIS: Stage IIIA (T3, N2, M0) non-small cell lung cancer, poorly differentiated squamous cell carcinoma diagnosed in February 2017 with PDL 1 expression of 35%,  presented with large left lower lobe lung mass in addition to left upper lobe lung nodule and mediastinal lymphadenopathy. PRIOR THERAPY: Course of concurrent chemoradiation with  weekly carboplatin for AUC of 2 and paclitaxel 45 MG/M2. Status post 7 cycles, last dose was given 11/19/2015 with partial response.  CURRENT THERAPY: Observation.   -T6 lesion followed up with a bone scan in October 2020   06/30/2019 -   Chief Complaint  Patient presents with  . Follow-up    IPF (idiopathic pulmonary fibrosis) (HCC)     HPI Jason Summers 86 y.o. -presents for follow-up of the above issues.  He is currently on nintedanib for his IPF.  He is tolerating it fine no GI side effects.  He has 1 month left.  He needs a refill of this/new prescription.  This is because of the change in rules with [facility].  Overall stable.  Wife however feels he is slowly declining.  She says the pulmonary is the only issue because otherwise he is eating well and sleeping well.  Apparently is also complaining of some restless legs which have asked him to talk to the primary care physician.  Again as before the overwhelming issues the dyspnea.  On a subjective symptom score his dyspnea appears similar to before but objectively the desaturation gradient is definitely worse in the last 1 year although the CT scan itself has been stable from earlier in January through August 2020.  Reviewed all this information again and noticed that he does have evidence of pulmonary hypertension.  This past week and the new [place] journal medicine the "increase study" was published showing a beneficial effect in 6-minute walk distance and possibly dyspnea in patient taking inhaled nebulized treprostinil in the setting of ILD including IPF.  I did discuss his vocal cord paralysis and is the 6 final lesions that became evident in October/November 2020.  I discussed this with Dr. Julien Nordmann his oncologist.  He indicated I could get a PET scan.  Patient is  agreeable for this.      SYMPTOM SCALE - ILD 08/12/2018 Weight 165# 12/09/2018 165# 06/30/2019  173# weight  O2 use ra ra RA - not done  Shortness of Breath 0 -> 5 scale with 5 being worst (score 6 If unable to do)    At rest 3  2  Simple tasks - showers, clothes change, eating, shaving _0 Household (dishes, doing bed, laundry) X -does not do. x x  Shopping 3 x 3  Walking level at own pace 4  3  Walking keeping up with others of same age _1 Walking up Stairs _2 Walking up [place] _3 Total (40 - 48) Dyspnea Score 25    How bad is your cough? 0    How bad is your fatigue 4         Simple office walk 185 feet x  3 laps goal with forehead probe 10/28/2017  06/15/2018 [place] - 250 feet x 3 laps 06/25/2018 Weight 171# 08/12/2018 ofev x 4-6 weeks. Unable to do PFT - weight 165# 12/09/2018 Weight -  01/26/2019 Wt 168# 03/28/2019 weigit - 173# 06/30/2019   O2 used Room  _4  rooom air Room air  Number laps completed _5 las x 25o feet _6 Comments about pace Good pace normal normal normal normal     Resting Pulse Ox/HR 100% and 83/min 97% and 87/min 98% and 96/min 99% and 83/min 99% and 96/min 99% 99% and 87/min 98and HR 92  Final  Pulse Ox/HR 97% and 96/min 91% and 101/mikn 90% and 110/min 91% and 102/min 92% and 116/min 90% and 117/min 92% and 99/min 90% and HR 107  Desaturated </= 88% _0   no no  Desaturated <= 3% points yds Yes, 6 points Yes, 8 points Yes, 8 points Yes, 7 points  Yes, 7 points Yes, 8 poitns  Got Tachycardic >/= 90/min _1   yes   Symptoms at end of test x Moderate dyspnea Mild dysnea Mild dyspnea Very dyspneic at last lap  Usual dyspnea Mild dyspneia  Miscellaneous comments x ? worse  Not using night o2 Not using o2  stable Hoarse voice worse with more walking    ROS - per HPI     has a past medical history of Acute rhinitis (08/10/2017), Acute stress disorder (10/27/2007),  Anxiety (07/14/2018), Arthritis, Bilateral epiphora (09/25/2015), Blush (09/23/2007), Cancer ([facility]), Chest pain in adult (12/16/2016), Chronic right-sided thoracic back pain (12/16/2016), Compression fracture of body of thoracic vertebra ([place]) (02/09/2019), Dysphagia (07/17/2010), Eustachian tube dysfunction, bilateral (08/10/2017), Ganglion of joint (04/22/2007), Gastroesophageal reflux disease (05/15/2016), Headache (01/30/2016), Hemorrhoids, HEMORRHOIDS, INTERNAL (02/18/2007), IPF (idiopathic pulmonary fibrosis) ([place]) (07/14/2018), Lateral epicondylitis (tennis elbow), Lingular mass (09/07/2015), Mass of lingula of lung (09/07/2015), Mass of lower lobe of left lung (09/07/2015), Mediastinal adenopathy (09/07/2015), Mediastinal lymphadenopathy (08/27/2015), Multiple pulmonary nodules (08/27/2015), Nausea and vomiting (05/15/2016), Non-small cell cancer of left lung ([place]) (09/24/2015), Non-small cell lung cancer (NSCLC) ([place]) (dx'f 07/30/15), Nuclear sclerosis of right eye (09/25/2015), Odynophagia (11/06/2015), Osteoarthritis (07/17/2010), Persistent headaches (02/03/2017), Pleural effusion on left (08/14/2016), Pseudophakia, left eye (09/25/2015), Pulmonary emphysema ([place]) (08/14/2016), 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   reports that he quit smoking about 8 years ago. His smoking use included cigarettes. He has a 12.40 pack-year smoking history. He has never used smokeless tobacco.  Past Surgical History:  Procedure Laterality Date  . COLONOSCOPY  2003  . KNEE ARTHROSCOPY  2006   right    Allergies  Allergen Reactions  . Gentamicin Other (See Comments)    Made eyes red, swollen, hot feeling  . Loteprednol-Tobramycin Other (See Comments)    Other reaction(s): Other (See Comments) Made eyes Red, Swollen, Red, Warm feeling  Other reaction(s): Other (See  Comments) Made eyes Red, Swollen, Red, Warm feeling  Made eyes Red, Swollen, Red, Warm feeling     Immunization History  Administered Date(s) Administered  . Fluad Quad(high Dose 65+) 02/16/2019  . Influenza Split 03/19/2011, 03/12/2012  . Influenza Whole 06/09/1997, 04/22/2007, 03/14/2008, 03/28/2009, 05/09/2010  . Influenza, High Dose Seasonal PF 04/14/2013, 04/11/2015, 03/02/2017, 03/23/2018  . Influenza,inj,Quad PF,6+ Mos 05/07/2016  . Influenza-Unspecified 04/10/2014  . Pneumococcal Conjugate-13 10/14/2013  . Pneumococcal Polysaccharide-23 06/09/2004, 10/04/2012  . Td 06/09/2005  . Zoster 02/04/2015    Family History  Problem Relation Age of Onset  . Hypertension Mother   . Heart disease Mother   . Coronary artery disease Other      Current Outpatient Medications:  .  albuterol (PROVENTIL HFA;VENTOLIN HFA) 108 (90 Base) MCG/ACT inhaler, Inhale 2 puffs into the lungs every 6 (six) hours as needed for wheezing or shortness of breath., Disp: 1 Inhaler, Rfl: 6 .  calcium carbonate (TUMS - DOSED IN MG ELEMENTAL CALCIUM) 500 MG chewable tablet, Chew 1 tablet by mouth 2 (two) times daily as needed. , Disp: , Rfl:  .  esomeprazole (NEXIUM) 40 MG capsule, Take 1 capsule (40 mg total) by mouth daily., Disp: 90 capsule, Rfl: 3 .  glucosamine-chondroitin 500-400 MG tablet, Take 1 tablet by mouth daily. Reported on 09/11/2015, Disp: , Rfl:  .  INCRUSE ELLIPTA 62.5 MCG/INH AEPB, USE 1 INHALATION DAILY, Disp: 30 each, Rfl: 6 .  OFEV 150 MG CAPS, Take 1 tablet by mouth 2 (two) times daily., Disp: , Rfl:  .  predniSONE (DELTASONE) 10 MG tablet, TAKE 1 1/2 TABLET BY MOUTH DAILY WITH BREAKFAST, Disp: 44 tablet, Rfl: 3 .  tamsulosin (FLOMAX) 0.4 MG CAPS capsule, Take 1 capsule (0.4 mg total) by mouth daily., Disp: 90 capsule, Rfl: 3 .  traZODone (DESYREL) 50 MG tablet, Take 2 tablets (100 mg total) by mouth at bedtime., Disp: 180 tablet, Rfl: 0      Objective:   Vitals:   06/30/19 0956   BP: 128/70  Pulse: 96  Temp: 97.6 F (36.4 C)  SpO2: 94%  Weight: 173 lb (78.5 kg)  Height: _0  (1.676 m)    Estimated body mass index is 27.92 kg/m as calculated from the following:   Height as of this encounter: _1  (1.676 m).   Weight as of this encounter: 173 lb (78.5 kg).  _2 @  [employer]   06/30/19 0956  Weight: 173 lb (78.5 kg)     Physical Exam  General Appearance:    Alert, cooperative, no distress, appears stated age - yes , Deconditioned looking - yes , OBESE  - no, Sitting on Wheelchair -  no  Head:    Normocephalic, without obvious abnormality, atraumatic  Eyes:    PERRL, conjunctiva/corneas clear,  Ears:    Normal TM's and external ear canals, both ears  Nose:   Nares normal, septum midline, mucosa normal, no drainage    or sinus tenderness. OXYGEN ON  - no . Patient is @ ra   Throat:   Lips, mucosa, and tongue normal; teeth and gums normal. Cyanosis on lips - no  Neck:   Supple, symmetrical, trachea midline, no adenopathy;    thyroid:  no enlargement/tenderness/nodules; no carotid   bruit or JVD  Back:     Symmetric, no curvature, ROM normal, no CVA tenderness  Lungs:     Distress - no , Wheeze no, Barrell Chest - no, Purse lip breathing - no, Crackles - no   Chest Wall:    No tenderness or deformity.    Heart:    Regular rate and rhythm, S1 and S2 normal, no rub   or gallop, Murmur - no  Breast Exam:    NOT DONE  Abdomen:     Soft, non-tender, bowel sounds active all four quadrants,    no masses, no organomegaly. Visceral obesity - yes  Genitalia:   NOT DONE  Rectal:   NOT DONE  Extremities:   Extremities - normal, Has Cane - no, Clubbing - no, Edema - no  Pulses:   2+ and symmetric all extremities  Skin:   Stigmata of Connective Tissue Disease - no  Lymph nodes:   Cervical, supraclavicular, and axillary nodes normal  Psychiatric:  Neurologic:   Pleasant - yes, Anxious - yes, Flat affect - yes  CAm-ICU - neg, Alert and Oriented x 3  - yes, Moves all 4s - yes, Speech - normal, Cognition - intact           Assessment:       ICD-10-CM   1. IPF (idiopathic pulmonary fibrosis) (  [place])  [id number] Pulmonary function test  2. Therapeutic drug monitoring  Z51.81 Hepatic function panel    Pulmonary function test  3. Shortness of breath  R06.02 NM PET Image Initial (PI) Skull Base To Thigh  4. Pulmonary emphysema, unspecified emphysema type ([place])  J43.9   5. Non-small cell cancer of left lung (HCC)  C34.92        Plan:     Patient Instructions  IPF (idiopathic pulmonary fibrosis) ([place]) with out of proportion shortness of breath - Plan: - seems stable on CT scan jan 2020 -> aug 2020 but clinically you might be progressive again this visit Jan 2021. Regardless ofev is helping  plan - continue ofev - do new script  - ok for covid vaccine - allergy history is a bit of a caution    Encounter for itnensive  therapeutic drug monitoring  -Ofev:  tolerating it well but needs regular/intensive plan of  LFT check due to DILI - drug induced liver injury   - Plan: - continue ofev with mylanta as needed - check LFT 06/30/2019 -    Pulmonary emphysema, unspecified emphysema type ([place]) - stable   Plan: continue incruse daily with daily prednisone.Can reduce prednisone to 81m per day  Albuterol as needed   Shortness of breath - many reasons  Plan  - if PET scan is without cance recurreence can try gettting Right heart cath after d/w Dr NJohnsie Cancel- New INCREASE study showed inhaled TYVASO to be beneficial (awaiting approval in Spring 2021)      Abnormal computed tomography of thoracic spine - new  Aug 2020 Non-small cell cancer of left lung ([place]   - Plan: get PET scan  Followup  - will call with results - do spirometry/dlco in 3 months  - return in 3 months fpr clinical followup  - 30 min slot (ILD or any clinic)      SIGNATURE    Dr. MBrand Males M.D., F.C.C.P,  Pulmonary and Critical Care  Medicine Staff Physician, CSugarcreekDirector - Interstitial Lung Disease  Program  Pulmonary [place] [address] Pager: [phone] If no answer or between  15:00h - 7:00h: call [phone] Telephone: [phone]  10:46 AM 06/30/2019

## 2019-06-30 NOTE — Patient Instructions (Addendum)
IPF (idiopathic pulmonary fibrosis) ([place]) with out of proportion shortness of breath - Plan: - seems stable on CT scan jan 2020 -> aug 2020 but clinically you might be progressive again this visit Jan 2021. Regardless ofev is helping  plan - continue ofev - do new script  - ok for covid vaccine - allergy history is a bit of a caution    Encounter for therapeutic drug monitoring  -Ofev:  tolerating it well   - Plan: - continue ofev with mylanta as needed - check LFT 06/30/2019    Pulmonary emphysema, unspecified emphysema type ([place]) - stable   Plan: continue incruse daily with daily prednisone.Can reduce prednisone to 20m per day  Albuterol as needed   Shortness of breath - many reasons  Plan  - if PET scan is without cance recurreence can try gettting Right heart cath after d/w Dr NJohnsie Cancel- New INCREASE study showed inhaled TYVASO to be beneficial (awaiting approval in Spring 2021)      Abnormal computed tomography of thoracic spine - new  Aug 2020 Non-small cell cancer of left lung ([place]   - Plan: get PET scan  Followup  - will call with results - do spirometry/dlco in 3 months  - return in 3 months fpr clinical followup  - 30 min slot (ILD or any clinic)

## 2019-06-30 NOTE — Telephone Encounter (Signed)
Ofev renewal Prior Authorization was denied by insurance. Pharmacy team is working on appeal. Once approved, we will be able to determine cost. We will follow up with patient.

## 2019-06-30 NOTE — Telephone Encounter (Signed)
This patient was seen by Dr. Chase Caller today. Per Dr. Chase Caller the patient will need to continue taking Ofev 150 mg one tablet twice daily.  Does this need another approval process with insurance? Will it be cheaper for the patient to continue to get this from [facility] or from a specialty pharmacy?  The patient stated he has one month left of the medication as of today's office visit.  Any help you can give would be appreciated.

## 2019-06-30 NOTE — Progress Notes (Signed)
Lft normal

## 2019-07-01 ENCOUNTER — Ambulatory Visit: Payer: Medicare Other | Attending: Internal Medicine

## 2019-07-01 ENCOUNTER — Encounter: Payer: Self-pay | Admitting: Pharmacist

## 2019-07-01 DIAGNOSIS — Z23 Encounter for immunization: Secondary | ICD-10-CM | POA: Insufficient documentation

## 2019-07-01 NOTE — Progress Notes (Signed)
Marland Kitchen  Covid-19 Vaccination Clinic  Name:  Jason Summers    MRN: [id number] DOB: [birth year]  07/01/2019  [name] was observed post Covid-19 immunization for 15 minutes without incidence. He was provided with Vaccine Information Sheet and instruction to access the V-Safe system.   [name] was instructed to call 911 with any severe reactions post vaccine: Marland Kitchen Difficulty breathing  . Swelling of your face and throat  . A fast heartbeat  . A bad rash all over your body  . Dizziness and weakness    Immunizations Administered    Name Date Dose VIS Date Route   Pfizer COVID-19 Vaccine 07/01/2019 12:40 PM 0.3 mL 05/20/2019 Intramuscular   Manufacturer: [place]   Lot: [id number]   [place]: [id number]

## 2019-07-01 NOTE — Progress Notes (Signed)
Appeal Letter for Ofev. 

## 2019-07-02 ENCOUNTER — Other Ambulatory Visit: Payer: Self-pay | Admitting: Adult Health

## 2019-07-02 DIAGNOSIS — F419 Anxiety disorder, unspecified: Secondary | ICD-10-CM

## 2019-07-05 NOTE — Telephone Encounter (Signed)
Ok to fill for 90 days plus one refill.   We can change the dose from 50 mg ( take two tabs = 135m) to 100 mg tabs. Please let patient know that we are changing so that he does not take two 100 mg pills

## 2019-07-05 NOTE — Telephone Encounter (Signed)
Spoke with patient and he stated that he did not request medication, he does not take this medication anymore.

## 2019-07-07 NOTE — Telephone Encounter (Signed)
Ok thanks

## 2019-07-08 NOTE — Telephone Encounter (Signed)
Amber, please advise if you have received an update on this. I know that appeals can sometimes take more time to get response back, but wanted to just check to see if you might have an update.

## 2019-07-08 NOTE — Telephone Encounter (Signed)
Called [facility], appeal is still in process. Will follow up next week.  Phone# [phone]

## 2019-07-11 NOTE — Telephone Encounter (Signed)
Received a fax from [facility].   Date of Decision in regards to the appeal: 07/09/19  Patient's OFEV has been approved from 06/10/19-07/08/20.  Routing to [employer] as an [employer]

## 2019-07-11 NOTE — Telephone Encounter (Signed)
Received notification from [place] regarding a prior authorization for [place]. Authorization has been APPROVED from 07/01/19 to 06/30/20.   Authorization # [id number] Phone # [phone]  Called patient to advise, spoke to wife. She states the pharmacy has already called to schedule shipment. Nothing further is needed.  2:09 PM Beatriz Chancellor, CPhT

## 2019-07-12 ENCOUNTER — Ambulatory Visit (HOSPITAL_COMMUNITY)
Admission: RE | Admit: 2019-07-12 | Discharge: 2019-07-12 | Disposition: A | Discharge status: Home / Self Care | Payer: Medicare Other | Source: Ambulatory Visit | Attending: Internal Medicine | Admitting: Internal Medicine

## 2019-07-12 ENCOUNTER — Telehealth: Payer: Self-pay | Admitting: Internal Medicine

## 2019-07-12 ENCOUNTER — Other Ambulatory Visit: Payer: Self-pay

## 2019-07-12 DIAGNOSIS — J948 Other specified pleural conditions: Secondary | ICD-10-CM | POA: Diagnosis not present

## 2019-07-12 DIAGNOSIS — R0602 Shortness of breath: Secondary | ICD-10-CM | POA: Diagnosis not present

## 2019-07-12 DIAGNOSIS — R918 Other nonspecific abnormal finding of lung field: Secondary | ICD-10-CM | POA: Insufficient documentation

## 2019-07-12 DIAGNOSIS — Z79899 Other long term (current) drug therapy: Secondary | ICD-10-CM | POA: Diagnosis not present

## 2019-07-12 DIAGNOSIS — J84112 Idiopathic pulmonary fibrosis: Secondary | ICD-10-CM

## 2019-07-12 LAB — GLUCOSE, CAPILLARY: Glucose-Capillary: 94 mg/dL (ref 70–99)

## 2019-07-12 MED ORDER — INCRUSE ELLIPTA 62.5 MCG/INH IN AEPB: 1.0000 | INHALATION_SPRAY | Freq: Every day | RESPIRATORY_TRACT | 3 refills | Status: AC

## 2019-07-12 NOTE — Telephone Encounter (Signed)
Refill of pt's incruse has been sent to preferred pharmacy for pt. Pt just had PET performed today, 2/2 and results have not yet been reviewed.  MR, please advise on the results of pt's PET as pt's wife Jason Summers is calling requesting the results.

## 2019-07-13 ENCOUNTER — Encounter: Payer: Medicare Other | Admitting: Adult Health

## 2019-07-13 NOTE — Telephone Encounter (Signed)
No evidence of cancer recurrence  Compared to augst there is a small fluid/air collection in left lung base - but nothing to do about that other than to just watch  Small LUL nodule - again nothing to do other than to watch    NM PET Image Initial (PI) Skull Base To Thigh  Result Date: 07/12/2019 CLINICAL DATA:  Subsequent treatment strategy for lung carcinoma. EXAM: NUCLEAR MEDICINE PET SKULL BASE TO THIGH TECHNIQUE: 8.6 mCi F-18 FDG was injected intravenously. Full-ring PET imaging was performed from the skull base to thigh after the radiotracer. CT data was obtained and used for attenuation correction and anatomic localization. Fasting blood glucose: 90 mg/dl COMPARISON:  4 PET-CT 02/03/2019 FINDINGS: Mediastinal blood pool activity: SUV max 2.1 Liver activity: SUV max NA NECK: No hypermetabolic lymph nodes in the neck. Incidental CT findings: none CHEST: Volume loss in LEFT hemithorax. Perihilar bronchiectasis and linear consolidation consistent radiation change similar. No hypermetabolic nodules in the LEFT lung suggest lung cancer recurrence. No hypermetabolic mediastinal lymph nodes. There is 1 focus of increased nodular hypermetabolic activity in the anterior aspect of the lingula adjacent to the mediastinum with SUV max equal 3.4. This small pleural nodule measuring 7 mm image 78/4. This is adjacent to the small fluid collection in the anterior aspect of the LEFT upper lobe (image 74/4). The fluid collection was present on comparison CT from 01/13/2019 that time head interspersed gas. Gas is resolved. In the LEFT lower lobe there is a small hydropneumothorax at site chronic infusion. The hydropneumothorax and interspersed gas is new from comparison exam. (Images 92-112 series 4). A peripheral subpleural reticulation in the RIGHT lung is increased but no metabolic activity. Incidental CT findings: Decreased subpleural reticulation in LEFT and RIGHT lung. There is mild metabolic activity  associated with this which is favored inflammatory. ABDOMEN/PELVIS: No abnormal hypermetabolic activity within the liver, pancreas, adrenal glands, or spleen. No hypermetabolic lymph nodes in the abdomen or pelvis. Incidental CT findings: none SKELETON: No focal hypermetabolic activity to suggest skeletal metastasis. Incidental CT findings: none IMPRESSION: 1. No evidence lung cancer recurrence. Post therapy change in the LEFT lung. 2. Small hypermetabolic nodule along an anterior LEFT upper lobe fluid collection is favored inflammatory. Recommend attention on more short-term follow-up. 3. Small hydropneumothorax at the LEFT lung base. Query recent thoracentesis versus chronic empyema. 4. Increased bilateral subpleural nodule reticulation with mild metabolic activity is favored related to pulmonary fibrosis. 5. No metastatic adenopathy.  No distant metastatic disease. Electronically Signed   By: Suzy Bouchard M.D.   On: 07/12/2019 11:35

## 2019-07-13 NOTE — Telephone Encounter (Signed)
Dr. Chase Caller,  I called and spoke to the patient's spouse Mickel Baas) and advised her of the result information received. Confirmed he was scheduled for the PFT on 09/28/19 with OV same day.  She stated he has been having trouble breathing. I let her know the prescription for Incruse was sent to [facility] and suggested that he continue taking it and using the rescue inhaler.  Was there anything else that I needed to tell her based on this?

## 2019-07-14 ENCOUNTER — Telehealth: Payer: Self-pay | Admitting: Internal Medicine

## 2019-07-14 MED ORDER — OFEV 150 MG PO CAPS: 150.0000 mg | ORAL_CAPSULE | Freq: Two times a day (BID) | ORAL | 11 refills | Status: DC

## 2019-07-14 NOTE — Telephone Encounter (Signed)
Called and spoke to pt's wife, Jason Summers. Pt is needing a refill of Ofev sent to [facility], pt's wife states this is where they normally get the refills from. Rx sent to preferred pharmacy.   Pt also requesting results of PET scan. Is it also ok to go ahead and refer to Dr. Johnsie Cancel?  MR please advise. Thanks.

## 2019-07-14 NOTE — Telephone Encounter (Signed)
Mickel Baas called back to let us know that she would like to talk to Dr. Chase Caller regarding Jason Summers needing to see his cardiologist before the new medication that Dr. Chase Caller is wanting to put him on in April. Please advise

## 2019-07-15 NOTE — Telephone Encounter (Signed)
See my other phone note from today.  The next visit is in April but in the interim if there are - concerns of worsening breathing they can always see nurse practitioner or go to the emergency room

## 2019-07-15 NOTE — Telephone Encounter (Signed)
I am confused about the nintedanib.  At the pharmacy sent me a message a few weeks ago saying that insurance denied it.  Please check with Amber Yopp about nintedanib   PET scan results already given-please see previous phone note  Seeing cardiologist for elevated pulmonary artery pressures: At this point in time right heart catheterization is not needed because TYVASO is not approved by the FDA for pulmonary hypertension related interstitial lung disease/pulmonary fibrosis.  They are welcome to make an appointment with Dr Johnsie Cancel as they see fit.  However if the medication gets approved for this indication then we can always consider right heart catheterization

## 2019-07-15 NOTE — Telephone Encounter (Signed)
Will close this note

## 2019-07-15 NOTE — Telephone Encounter (Signed)
An expedited appeal was done for pt's OFEV and the med had been approved after the appeal.

## 2019-07-18 NOTE — Telephone Encounter (Signed)
Spoke with pt's wife, Mickel Baas. She is aware of MR's response. States that the pt does not feel like he needs an appointment at this time but will call if anything changes.

## 2019-07-21 ENCOUNTER — Telehealth: Payer: Self-pay | Admitting: Internal Medicine

## 2019-07-21 NOTE — Telephone Encounter (Signed)
Dr. Chase Caller, I dont see any notes where you have spoken to cardiologist about cath. Please advise.

## 2019-07-21 NOTE — Telephone Encounter (Signed)
Right heart cath is not required at this time because the medicine is not approved and do not know if will be approved for pulm htn associated with IPF

## 2019-07-22 NOTE — Telephone Encounter (Signed)
LMOM TCB x1 to discuss MR's response as below.

## 2019-07-22 NOTE — Telephone Encounter (Signed)
Called the patient wife and made her aware of the response from Dr. Chase Caller. She voiced understanding. Nothing further needed at this time.

## 2019-07-23 ENCOUNTER — Ambulatory Visit: Payer: Medicare Other | Attending: Internal Medicine

## 2019-07-23 DIAGNOSIS — Z23 Encounter for immunization: Secondary | ICD-10-CM | POA: Insufficient documentation

## 2019-07-23 NOTE — Progress Notes (Signed)
   Covid-19 Vaccination Clinic  Name:  Jason Summers    MRN: [id number] DOB: 1932/03/11  07/23/2019  Jason Summers was observed post Covid-19 immunization for 15 minutes without incidence. He was provided with Vaccine Information Sheet and instruction to access the V-Safe system.   Jason Summers was instructed to call 911 with any severe reactions post vaccine: Marland Kitchen Difficulty breathing  . Swelling of your face and throat  . A fast heartbeat  . A bad rash all over your body  . Dizziness and weakness    Immunizations Administered    Name Date Dose VIS Date Route   Pfizer COVID-19 Vaccine 07/23/2019 12:50 PM 0.3 mL 05/20/2019 Intramuscular   Manufacturer: [place]   Lot: [id number]   [place]: [id number]

## 2019-08-04 ENCOUNTER — Other Ambulatory Visit: Payer: Self-pay | Admitting: Internal Medicine

## 2019-08-09 ENCOUNTER — Telehealth: Payer: Self-pay | Admitting: Internal Medicine

## 2019-08-09 DIAGNOSIS — J84112 Idiopathic pulmonary fibrosis: Secondary | ICD-10-CM

## 2019-08-09 DIAGNOSIS — C3492 Malignant neoplasm of unspecified part of left bronchus or lung: Secondary | ICD-10-CM

## 2019-08-09 DIAGNOSIS — J439 Emphysema, unspecified: Secondary | ICD-10-CM

## 2019-08-09 DIAGNOSIS — R0602 Shortness of breath: Secondary | ICD-10-CM

## 2019-08-09 MED ORDER — PREDNISONE 10 MG PO TABS: ORAL_TABLET | ORAL | 3 refills | Status: DC

## 2019-08-09 NOTE — Telephone Encounter (Signed)
Patient last issued the prednisone 10 mg with refills 04/08/19. Patient last seen by Dr. Chase Caller 06/30/19. LVM for patient and spouse to let them know the refill will be sent to the pharmacy and to call if there are any questions.  Prescription sent, nothing further needed at this time.

## 2019-08-10 ENCOUNTER — Other Ambulatory Visit: Payer: Self-pay

## 2019-08-10 ENCOUNTER — Ambulatory Visit (INDEPENDENT_AMBULATORY_CARE_PROVIDER_SITE_OTHER): Payer: Medicare Other | Admitting: Pulmonary Disease

## 2019-08-10 ENCOUNTER — Encounter: Payer: Self-pay | Admitting: Pulmonary Disease

## 2019-08-10 ENCOUNTER — Telehealth: Payer: Self-pay | Admitting: Internal Medicine

## 2019-08-10 VITALS — BP 116/74 | HR 76 | Temp 98.4°F | Ht 66.0 in | Wt 174.0 lb

## 2019-08-10 DIAGNOSIS — J439 Emphysema, unspecified: Secondary | ICD-10-CM | POA: Diagnosis not present

## 2019-08-10 DIAGNOSIS — J84112 Idiopathic pulmonary fibrosis: Secondary | ICD-10-CM | POA: Diagnosis not present

## 2019-08-10 DIAGNOSIS — R0602 Shortness of breath: Secondary | ICD-10-CM

## 2019-08-10 MED ORDER — PREDNISONE 10 MG PO TABS: ORAL_TABLET | ORAL | 0 refills | Status: AC

## 2019-08-10 MED ORDER — PREDNISONE 10 MG PO TABS: ORAL_TABLET | ORAL | 3 refills | Status: DC

## 2019-08-10 MED ORDER — STIOLTO RESPIMAT 2.5-2.5 MCG/ACT IN AERS: 2.0000 | INHALATION_SPRAY | Freq: Every day | RESPIRATORY_TRACT | 0 refills | Status: DC

## 2019-08-10 NOTE — Assessment & Plan Note (Signed)
Tolerated walk in office today without any desaturations  Plan: Prednisone taper today, then resume daily prednisone dose Trial of Stiolto Respimat We will discuss patient shortness of breath with Dr. Chase Caller

## 2019-08-10 NOTE — Patient Instructions (Addendum)
You were seen today by Lauraine Rinne, NP  for:   It was nice seeing you today.  I am sorry that you are having worsened breathing.  We will investigate this with a walk in our office.  We will also trial you on a new inhaler in place of your Incruse Ellipta.  Please let us know how you are doing on the new inhaler, let us know when you have finished the first sample.  Continue your Ofev.  I will follow up with Dr. Chase Caller regarding your shortness of breath.  Nice seeing you both as always,  Jason Summers  1. IPF (idiopathic pulmonary fibrosis) (HCC)  Continue OFEV   Complete spirometry with DLCO (this is the breathing test) as scheduled  Prednisone 9m tablet  >>>4 tabs for 3 days, then 3 tabs for 3 days, 2 tabs for 3 days, then resume 1 1/2  tab daily >>>take with food  >>>take in the morning     2. Pulmonary emphysema, unspecified emphysema type (HCC)  STOP Incruse   Trial Stiolto Respimat inhaler >>>2 puffs daily >>>Take this no matter what >>>This is not a rescue inhaler  Please contact our office when you finish the first sample and let uKoreaknow if you are noticing any symptomatic improvement.  If you are we can place a prescription for this.  3. Shortness of breath  Walk today in office, tolerated with no desats - no oxygen needed  I will discuss your worsening shortness of breath with Dr. RChase Caller Follow Up:    Return in about 4 weeks (around 09/07/2019), or if symptoms worsen or fail to improve, for Follow up with Dr. RPurnell Shoemaker ILD clinic slot 350m .  >>> If no availabilities with Dr. RaChase Callert the 4-week mark please schedule at his next available around that timeframe in 30-minute time slot  Please do your part to reduce the spread of COVID-19:      Reduce your risk of any infection  and COVID19 by using the similar precautions used for avoiding the common cold or flu:  . Marland Kitchenash your hands often with soap and warm water for at least 20 seconds.  If soap and  water are not readily available, use an alcohol-based hand sanitizer with at least 60% alcohol.  . If coughing or sneezing, cover your mouth and nose by coughing or sneezing into the elbow areas of your shirt or coat, into a tissue or into your sleeve (not your hands). . Langley Gauss MASK when in public  . Avoid shaking hands with others and consider head nods or verbal greetings only. . Avoid touching your eyes, nose, or mouth with unwashed hands.  . Avoid close contact with people who are sick. . Avoid places or events with large numbers of people in one location, like concerts or sporting events. . If you have some symptoms but not all symptoms, continue to monitor at home and seek medical attention if your symptoms worsen. . If you are having a medical emergency, call 911.   [place] e-Visit: [email or web address]       [facility]: [address]: [phone]                 [facility] [phone]   It is flu season:   >>> Best ways to protect herself from the flu: Receive the yearly flu vaccine,  practice good hand hygiene washing with soap and also using hand sanitizer when available, eat a nutritious meals, get adequate rest, hydrate appropriately   Please contact the office if your symptoms worsen or you have concerns that you are not improving.   Thank you for choosing [facility] for your healthcare, and for allowing [place] to partner with you on your healthcare journey. I am thankful to be able to provide care to you today.   Wyn Quaker FNP-C

## 2019-08-10 NOTE — Telephone Encounter (Signed)
error

## 2019-08-10 NOTE — Assessment & Plan Note (Addendum)
Assessment: UIP on January/2020 CT chest Currently managed on 0FEV Currently managed on 15 mg of prednisone daily  Plan: Prednisone taper today, extended, then restart 20m prednisone daily  Continue OFEV  4-week follow-up with our office, patient on list to see Dr. RChase Caller  If symptoms persist will need to have discussion of goals of care and potential consideration of referral to palliative

## 2019-08-10 NOTE — Assessment & Plan Note (Signed)
Doubt patient is able to mobilize DPI of Incruse Ellipta well Explained to patient that without adequate pulmonary function testing is difficult to fully state whether or not patient does require being on a inhaler This is more for symptomatic relief  Plan: Stop Incruse Ellipta Trial of Stiolto Respimat 4-week follow-up

## 2019-08-10 NOTE — Progress Notes (Signed)
_0  ID: Jason Summers, male    DOB: [birth year], 86 y.o.   MRN: [id number]  Chief Complaint  Patient presents with  . Follow-up    F/U on SOB. SOB has increased over the past few weeks.     Referring provider: [name], NP  HPI:  86 year old male former smoker followed in our office for idiopathic pulmonary fibrosis  PMH: GERD, lung cancer, radiation fibrosis of the lung, esophageal strictures, chest pain, headaches Smoker/ Smoking History: Former smoker Maintenance: [place] Hard Ellipta  Pt of: Dr. Chase Caller   08/10/2019  - Visit   86 year old male former smoker followed in our office for IPF.  Patient was last seen in January/2021.  At that point in time patient was requested to follow-up in 3 months with a spirometry and DLCO.  Patient presented to our office today reporting that over the last 2 to 3 weeks has had progressive increases in his shortness of breath.  This is despite adherence to Incruse Ellipta, 15 mg of prednisone daily, Ofev, and other medications.  Patient denies other acute symptoms such as wheezing, productive cough, fevers, chest pain, heart racing.  He does have occasional chest tightness with significant physical exertion.  Presenting today as an acute visit to see if there is any other evaluations or changes we could have to his medications.  Unfortunately we do not have pulmonary function testing on the patient yet.  Patient has made attempts to complete but has been unsuccessful.  Patient presents today with his spouse who is very supportive of him.  Tolerated walk in office today without any oxygen desaturations  Questionaires / Pulmonary Flowsheets:   MMRC: mMRC Dyspnea Scale mMRC Score  08/10/2019 2    Tests:   06/22/2018-CT chest with contrast- spectrum of findings suggestive of basilar predominant fibrotic interstitial lung disease with mild honeycombing asymmetrically involving the right lung with significant progression since  February/2019 in September/2019 chest CT.  Findings consistent with UIP.  06/22/2018-echocardiogram- LV ejection fraction 55 to 30%, grade 1 diastolic dysfunction, PAP pressure 45  ONO 06/17/2018 - < 88% for 37 min only.  07/12/2019-PET scan-no evidence of lung cancer recurrence, post therapy changes to the left lung, small hypermetabolic nodule along the anterior left upper lobe fluid collection is favored inflammatory, recommend attention on more short-term follow-up, small hydropneumothorax at left lung base, questionable chronic empyema, increased bilateral subpleural nodule reticulation and mild metabolic activity is favored related to pulmonary fibrosis  FENO:  No results found for: NITRICOXIDE  PFT: No flowsheet data found.  WALK:  SIX MIN WALK 08/10/2019 06/30/2019 03/28/2019 01/26/2019 12/03/2018 11/03/2018 10/19/2018  Supplimental Oxygen during Test? (L/min) _1  No No  Tech Comments: Patient was able to complete 2 laps at a steady pace. He did have some SOB after 2nd lap. No O2 needed during or after walk. Patient walked a slow steady pace and did not have to stop to sit or rest during the walk. The patient denied chest tightness, dizziness or shortness of breath. However, could hear he was breathing harder during 1/2 mark of second lap and during entire 3rd lap. Walked a slow rate, reports his baseline SOB.Denies chest pain. Tolerated well. Patient was able to maintain a steady walking pace and did not have to stop at any time during the walk. Pt. did well with walk, pt. did get sob on last half of 3rd lap. Pt. rest was O:99   P:74  ending O:90   P:100 ///pmc Pt  walked slow to moderste pace tolkerated well, was winded by lap 3. No c/o dizziness or lighheadedness. patient was increased short of breath with each lap. tolerated well. no complaints of dizziness or lightheadedness. walked at a slow to moderate pace    Imaging: NM PET Image Initial (PI) Skull Base To Thigh  Result Date:  07/12/2019 CLINICAL DATA:  Subsequent treatment strategy for lung carcinoma. EXAM: NUCLEAR MEDICINE PET SKULL BASE TO THIGH TECHNIQUE: 8.6 mCi F-18 FDG was injected intravenously. Full-ring PET imaging was performed from the skull base to thigh after the radiotracer. CT data was obtained and used for attenuation correction and anatomic localization. Fasting blood glucose: 90 mg/dl COMPARISON:  4 PET-CT 02/03/2019 FINDINGS: Mediastinal blood pool activity: SUV max 2.1 Liver activity: SUV max NA NECK: No hypermetabolic lymph nodes in the neck. Incidental CT findings: none CHEST: Volume loss in LEFT hemithorax. Perihilar bronchiectasis and linear consolidation consistent radiation change similar. No hypermetabolic nodules in the LEFT lung suggest lung cancer recurrence. No hypermetabolic mediastinal lymph nodes. There is 1 focus of increased nodular hypermetabolic activity in the anterior aspect of the lingula adjacent to the mediastinum with SUV max equal 3.4. This small pleural nodule measuring 7 mm image 78/4. This is adjacent to the small fluid collection in the anterior aspect of the LEFT upper lobe (image 74/4). The fluid collection was present on comparison CT from 01/13/2019 that time head interspersed gas. Gas is resolved. In the LEFT lower lobe there is a small hydropneumothorax at site chronic infusion. The hydropneumothorax and interspersed gas is new from comparison exam. (Images 92-112 series 4). A peripheral subpleural reticulation in the RIGHT lung is increased but no metabolic activity. Incidental CT findings: Decreased subpleural reticulation in LEFT and RIGHT lung. There is mild metabolic activity associated with this which is favored inflammatory. ABDOMEN/PELVIS: No abnormal hypermetabolic activity within the liver, pancreas, adrenal glands, or spleen. No hypermetabolic lymph nodes in the abdomen or pelvis. Incidental CT findings: none SKELETON: No focal hypermetabolic activity to suggest skeletal  metastasis. Incidental CT findings: none IMPRESSION: 1. No evidence lung cancer recurrence. Post therapy change in the LEFT lung. 2. Small hypermetabolic nodule along an anterior LEFT upper lobe fluid collection is favored inflammatory. Recommend attention on more short-term follow-up. 3. Small hydropneumothorax at the LEFT lung base. Query recent thoracentesis versus chronic empyema. 4. Increased bilateral subpleural nodule reticulation with mild metabolic activity is favored related to pulmonary fibrosis. 5. No metastatic adenopathy.  No distant metastatic disease. Electronically Signed   By: Suzy Bouchard M.D.   On: 07/12/2019 11:35    Lab Results:  CBC    Component Value Date/Time   WBC 7.1 05/18/2019 0829   RBC 4.96 05/18/2019 0829   HGB 15.1 05/18/2019 0829   HGB 13.1 02/12/2018 0841   HGB 14.3 01/27/2017 0826   HCT 45.5 05/18/2019 0829   HCT 43.0 01/27/2017 0826   PLT 197.0 05/18/2019 0829   PLT 214 02/12/2018 0841   PLT 183 01/27/2017 0826   MCV 91.8 05/18/2019 0829   MCV 86.4 01/27/2017 0826   MCH 28.7 02/12/2018 0841   MCHC 33.2 05/18/2019 0829   RDW 14.2 05/18/2019 0829   RDW 14.5 01/27/2017 0826   LYMPHSABS 1.4 05/18/2019 0829   LYMPHSABS 1.2 01/27/2017 0826   MONOABS 0.7 05/18/2019 0829   MONOABS 0.6 01/27/2017 0826   EOSABS 0.1 05/18/2019 0829   EOSABS 0.1 01/27/2017 0826   BASOSABS 0.0 05/18/2019 0829   BASOSABS 0.0 01/27/2017 0826  BMET    Component Value Date/Time   NA 140 05/18/2019 0829   NA 139 01/27/2017 0826   K 4.4 05/18/2019 0829   K 4.4 01/27/2017 0826   CL 102 05/18/2019 0829   CO2 31 05/18/2019 0829   CO2 25 01/27/2017 0826   GLUCOSE 90 05/18/2019 0829   GLUCOSE 102 01/27/2017 0826   BUN 23 05/18/2019 0829   BUN 15.5 01/27/2017 0826   CREATININE 0.95 05/18/2019 0829   CREATININE 0.90 02/12/2018 0841   CREATININE 0.9 01/27/2017 0826   CALCIUM 9.2 05/18/2019 0829   CALCIUM 9.1 01/27/2017 0826   GFRNONAA >60 02/12/2018 0841   GFRAA  >60 02/12/2018 0841    BNP    Component Value Date/Time   BNP 52.0 08/21/2015 1206    ProBNP No results found for: PROBNP  Specialty Problems      Pulmonary Problems   Multiple pulmonary nodules   Mass of lingula of lung   Non-small cell cancer of left lung ([place])    09/13/2015: Left lower lobe lung biopsy: Overall, the histologic features, in conjunction with the positive staining for cytokeratin 903, cytokeratin 5/6, and p63 support a diagnosis of poorly differentiated squamous cell carcinoma. There is likely sufficient tumor remaining for additional studies, if requested. (JBK:ds 09/17/15)        Pleural effusion on left   Pulmonary emphysema (HCC)   Radiation fibrosis of lung (HCC)   Shortness of breath   Acute rhinitis   IPF (idiopathic pulmonary fibrosis) ([place])    06/22/2018-CT chest with contrast- spectrum of findings suggestive of basilar predominant fibrotic interstitial lung disease with mild honeycombing asymmetrically involving the right lung with significant progression since February/2019 in September/2019 chest CT.  Findings consistent with UIP.  06/22/2018-echocardiogram- LV ejection fraction 55 to 00%, grade 1 diastolic dysfunction, PA P pressure 45  ONO 06/17/2018 - < 88% for 37 min only.  07/07/2018 - started OFEV          Allergies  Allergen Reactions  . Gentamicin Other (See Comments)    Made eyes red, swollen, hot feeling  . Loteprednol-Tobramycin Other (See Comments)    Other reaction(s): Other (See Comments) Made eyes Red, Swollen, Red, Warm feeling  Other reaction(s): Other (See Comments) Made eyes Red, Swollen, Red, Warm feeling  Made eyes Red, Swollen, Red, Warm feeling     Immunization History  Administered Date(s) Administered  . Fluad Quad(high Dose 65+) 02/16/2019  . Influenza Split 03/19/2011, 03/12/2012  . Influenza Whole 06/09/1997, 04/22/2007, 03/14/2008, 03/28/2009, 05/09/2010  . Influenza, High Dose Seasonal PF 04/14/2013,  04/11/2015, 03/02/2017, 03/23/2018  . Influenza,inj,Quad PF,6+ Mos 05/07/2016  . Influenza-Unspecified 04/10/2014  . PFIZER SARS-COV-2 Vaccination 07/01/2019, 07/23/2019  . Pneumococcal Conjugate-13 10/14/2013  . Pneumococcal Polysaccharide-23 06/09/2004, 10/04/2012  . Td 06/09/2005  . Zoster 02/04/2015    Past Medical History:  Diagnosis Date  . Acute rhinitis 08/10/2017  . Acute stress disorder 10/27/2007   Qualifier: Diagnosis of  By: Arnoldo Morale MD, Balinda Quails   Overview:  Overview:  Qualifier: Diagnosis of  By: Arnoldo Morale MD, Balinda Quails  . Anxiety 07/14/2018  . Arthritis   . Bilateral epiphora 09/25/2015  . Blush 09/23/2007   Qualifier: Diagnosis of  By: Arnoldo Morale MD, Balinda Quails   Overview:  Overview:  Qualifier: Diagnosis of  By: Arnoldo Morale MD, [facility])    basal cell on right temple  . Chest pain in adult 12/16/2016  . Chronic right-sided thoracic back pain 12/16/2016  . Compression fracture  of body of thoracic vertebra ([place]) 02/09/2019  . Dysphagia 07/17/2010   Qualifier: Diagnosis of  By: Trellis Paganini PA-c, Amy S   . Eustachian tube dysfunction, bilateral 08/10/2017  . Ganglion of joint 04/22/2007   Qualifier: Diagnosis of  By: Arnoldo Morale MD, Balinda Quails   . Gastroesophageal reflux disease 05/15/2016  . Headache 01/30/2016  . Hemorrhoids   . HEMORRHOIDS, INTERNAL 02/18/2007   Qualifier: Diagnosis of  By: Arnoldo Morale MD, Balinda Quails   . IPF (idiopathic pulmonary fibrosis) ([place]) 07/14/2018   06/22/2018-CT chest with contrast- spectrum of findings suggestive of basilar predominant fibrotic interstitial lung disease with mild honeycombing asymmetrically involving the right lung with significant progression since February/2019 in September/2019 chest CT.  Findings consistent with UIP.  06/22/2018-echocardiogram- LV ejection fraction 55 to 38%, grade 1 diastolic dysfunction, PA P pressure 4  . Lateral epicondylitis (tennis elbow)   . Lingular mass 09/07/2015  . Mass of lingula of lung 09/07/2015  . Mass of lower lobe of left  lung 09/07/2015  . Mediastinal adenopathy 09/07/2015  . Mediastinal lymphadenopathy 08/27/2015  . Multiple pulmonary nodules 08/27/2015  . Nausea and vomiting 05/15/2016  . Non-small cell cancer of left lung ([place]) 09/24/2015   09/13/2015: Left lower lobe lung biopsy: Overall, the histologic features, in conjunction with the positive staining for cytokeratin 903, cytokeratin 5/6, and p63 support a diagnosis of poorly differentiated squamous cell carcinoma. There is likely sufficient tumor remaining for additional studies, if requested. (JBK:ds 09/17/15)     . Non-small cell lung cancer (NSCLC) ([place]) dx'f 07/30/15  . Nuclear sclerosis of right eye 09/25/2015  . Odynophagia 11/06/2015  . Osteoarthritis 07/17/2010   Qualifier: Diagnosis of  By: Trellis Paganini PA-c, Amy S   . Persistent headaches 02/03/2017  . Pleural effusion on left 08/14/2016  . Pseudophakia, left eye 09/25/2015  . Pulmonary emphysema ([place]) 08/14/2016  . Radiation fibrosis of lung ([place]) 08/14/2016  . Shortness of breath 08/14/2016  . Soft tissue lesion of shoulder region 04/22/2007   Qualifier: Diagnosis of  By: Arnoldo Morale MD, Balinda Quails   Overview:  Overview:  Qualifier: Diagnosis of  By: Arnoldo Morale MD, Balinda Quails  . Spondylosis of cervical joint   . SPRAIN AND STRAIN OF SACROILIAC 09/06/2008   Qualifier: Diagnosis of  By: Arnoldo Morale MD, Balinda Quails     Tobacco History: Social History   Tobacco Use  Smoking Status Former Smoker  . Packs/day: 0.20  . Years: 62.00  . Pack years: 12.40  . Types: Cigarettes  . Quit date: 03/10/2011  . Years since quitting: 8.4  Smokeless Tobacco Never Used   Counseling given: Not Answered   Continue to not smoke  Outpatient Encounter Medications as of 08/10/2019  Medication Sig  . calcium carbonate (TUMS - DOSED IN MG ELEMENTAL CALCIUM) 500 MG chewable tablet Chew 1 tablet by mouth 2 (two) times daily as needed.   Marland Kitchen esomeprazole (NEXIUM) 40 MG capsule Take 1 capsule (40 mg total) by mouth daily.  Marland Kitchen glucosamine-chondroitin  500-400 MG tablet Take 1 tablet by mouth daily. Reported on 09/11/2015  . OFEV 150 MG CAPS Take 1 capsule (150 mg total) by mouth 2 (two) times daily.  . predniSONE (DELTASONE) 10 MG tablet TAKE 1 1/2 TABLET BY MOUTH DAILY WITH BREAKFAST  . PROAIR HFA 108 (90 Base) MCG/ACT inhaler INHALE 2 PUFFS BY MOUTH INTO THE LUNGS EVERY 6 HOURS AS NEEDED FOR WHEEZING OR SHORTNESS OF BREATH  . tamsulosin (FLOMAX) 0.4 MG CAPS capsule Take 1 capsule (0.4 mg total) by  mouth daily.  Marland Kitchen umeclidinium bromide (INCRUSE ELLIPTA) 62.5 MCG/INH AEPB Inhale 1 puff into the lungs daily.  . [DISCONTINUED] predniSONE (DELTASONE) 10 MG tablet TAKE 1 1/2 TABLET BY MOUTH DAILY WITH BREAKFAST  . predniSONE (DELTASONE) 10 MG tablet Take 4 tablets (40 mg total) by mouth daily with breakfast for 3 days, THEN 3 tablets (30 mg total) daily with breakfast for 3 days, THEN 2 tablets (20 mg total) daily with breakfast for 3 days, THEN 1 tablet (10 mg total) daily with breakfast for 3 days.  . Tiotropium Bromide-Olodaterol (STIOLTO RESPIMAT) 2.5-2.5 MCG/ACT AERS Inhale 2 puffs into the lungs daily.  . [DISCONTINUED] traZODone (DESYREL) 50 MG tablet Take 2 tablets (100 mg total) by mouth at bedtime.   No facility-administered encounter medications on file as of 08/10/2019.     Review of Systems  Review of Systems  Constitutional: Positive for fatigue. Negative for activity change, chills, fever and unexpected weight change.  HENT: Negative for postnasal drip, rhinorrhea, sinus pressure, sinus pain and sore throat.   Eyes: Negative.   Respiratory: Positive for shortness of breath. Negative for cough and wheezing.   Cardiovascular: Negative for chest pain, palpitations and leg swelling.  Gastrointestinal: Negative for constipation, diarrhea, nausea and vomiting.  Endocrine: Negative.   Genitourinary: Negative.   Musculoskeletal: Negative.   Skin: Negative.   Neurological: Negative for dizziness and headaches.  Psychiatric/Behavioral:  Positive for dysphoric mood. The patient is not nervous/anxious.   All other systems reviewed and are negative.    Physical Exam  BP 116/74 (BP Location: Left Arm, Patient Position: Sitting, Cuff Size: Normal)   Pulse 76   Temp 98.4 F (36.9 C) (Temporal)   Ht _0  (1.676 m)   Wt 174 lb (78.9 kg)   SpO2 97% Comment: on RA  BMI 28.08 kg/m   Wt Readings from Last 5 Encounters:  08/10/19 174 lb (78.9 kg)  06/30/19 173 lb (78.5 kg)  05/18/19 171 lb (77.6 kg)  04/25/19 171 lb 9.6 oz (77.8 kg)  03/28/19 173 lb 6.4 oz (78.7 kg)    BMI Readings from Last 5 Encounters:  08/10/19 28.08 kg/m  06/30/19 27.92 kg/m  05/18/19 26.19 kg/m  04/25/19 27.70 kg/m  03/28/19 27.99 kg/m     Physical Exam Vitals and nursing note reviewed.  Constitutional:      General: He is not in acute distress.    Appearance: Normal appearance. He is normal weight.  HENT:     Head: Normocephalic and atraumatic.     Right Ear: Hearing and external ear normal.     Left Ear: Hearing and external ear normal.     Nose: Nose normal. No mucosal edema or rhinorrhea.     Right Turbinates: Not enlarged.     Left Turbinates: Not enlarged.     Mouth/Throat:     Mouth: Mucous membranes are dry.     Pharynx: Oropharynx is clear. No oropharyngeal exudate.  Eyes:     Pupils: Pupils are equal, round, and reactive to light.  Cardiovascular:     Rate and Rhythm: Normal rate and regular rhythm.     Pulses: Normal pulses.     Heart sounds: Normal heart sounds. No murmur.  Pulmonary:     Effort: Pulmonary effort is normal.     Breath sounds: Rales (bibasilar crackles - diminished ) present. No decreased breath sounds or wheezing.  Musculoskeletal:     Cervical back: Normal range of motion.     Right lower leg:  No edema.     Left lower leg: No edema.  Lymphadenopathy:     Cervical: No cervical adenopathy.  Skin:    General: Skin is warm and dry.     Capillary Refill: Capillary refill takes less than 2  seconds.     Findings: No erythema or rash.  Neurological:     General: No focal deficit present.     Mental Status: He is alert and oriented to person, place, and time.     Motor: No weakness.     Coordination: Coordination normal.     Gait: Gait is intact. Gait normal.  Psychiatric:        Mood and Affect: Mood is depressed.        Behavior: Behavior normal. Behavior is cooperative.        Thought Content: Thought content normal.        Judgment: Judgment normal.       Assessment & Plan:   IPF (idiopathic pulmonary fibrosis) (HCC) Assessment: UIP on January/2020 CT chest Currently managed on 0FEV Currently managed on 15 mg of prednisone daily  Plan: Prednisone taper today, extended, then restart 10m prednisone daily  Continue OFEV  4-week follow-up with our office, patient on list to see Dr. RChase Caller  If symptoms persist will need to have discussion of goals of care and potential consideration of referral to palliative  Pulmonary emphysema ([place] Doubt patient is able to mobilize DPI of Incruse Ellipta well Explained to patient that without adequate pulmonary function testing is difficult to fully state whether or not patient does require being on a inhaler This is more for symptomatic relief  Plan: Stop Incruse Ellipta Trial of Stiolto Respimat 4-week follow-up  Shortness of breath Tolerated walk in office today without any desaturations  Plan: Prednisone taper today, then resume daily prednisone dose Trial of Stiolto Respimat We will discuss patient shortness of breath with Dr. RChase Caller   Return in about 4 weeks (around 09/07/2019), or if symptoms worsen or fail to improve, for Follow up with Dr. RPurnell Shoemaker [facility] clinic slot 334m .   BrLauraine RinneNP 08/10/2019   This appointment required 32 minutes of patient care (this includes precharting, chart review, review of results, face-to-face care, etc.).

## 2019-08-12 ENCOUNTER — Other Ambulatory Visit: Payer: Self-pay | Admitting: Family Medicine

## 2019-08-12 DIAGNOSIS — E7800 Pure hypercholesterolemia, unspecified: Secondary | ICD-10-CM

## 2019-08-16 ENCOUNTER — Other Ambulatory Visit: Payer: Self-pay

## 2019-08-17 ENCOUNTER — Other Ambulatory Visit (INDEPENDENT_AMBULATORY_CARE_PROVIDER_SITE_OTHER): Payer: Medicare Other

## 2019-08-17 DIAGNOSIS — E7800 Pure hypercholesterolemia, unspecified: Secondary | ICD-10-CM | POA: Diagnosis not present

## 2019-08-17 LAB — LIPID PANEL
Cholesterol: 204 mg/dL — ABNORMAL HIGH (ref 0–200)
HDL: 63.7 mg/dL
LDL Cholesterol: 122 mg/dL — ABNORMAL HIGH (ref 0–99)
NonHDL: 140.23
Total CHOL/HDL Ratio: 3
Triglycerides: 93 mg/dL (ref 0.0–149.0)
VLDL: 18.6 mg/dL (ref 0.0–40.0)

## 2019-08-18 ENCOUNTER — Other Ambulatory Visit: Payer: Self-pay | Admitting: Adult Health

## 2019-08-18 ENCOUNTER — Other Ambulatory Visit: Payer: Self-pay | Admitting: Family Medicine

## 2019-08-18 MED ORDER — ROSUVASTATIN CALCIUM 5 MG PO TABS: 5.0000 mg | ORAL_TABLET | Freq: Every day | ORAL | 0 refills | Status: DC

## 2019-08-20 ENCOUNTER — Other Ambulatory Visit: Payer: Self-pay | Admitting: Adult Health

## 2019-08-24 NOTE — Telephone Encounter (Signed)
Pharmacy asking for a 90 day supply.  When I spoke to the pt he wanted only 30 tabs.  Request refused.

## 2019-09-06 ENCOUNTER — Ambulatory Visit (INDEPENDENT_AMBULATORY_CARE_PROVIDER_SITE_OTHER): Payer: Medicare Other | Admitting: Adult Health

## 2019-09-06 ENCOUNTER — Encounter: Payer: Self-pay | Admitting: Adult Health

## 2019-09-06 ENCOUNTER — Other Ambulatory Visit: Payer: Self-pay

## 2019-09-06 VITALS — BP 122/70 | HR 102 | Temp 97.1°F | Wt 178.0 lb

## 2019-09-06 DIAGNOSIS — T148XXA Other injury of unspecified body region, initial encounter: Secondary | ICD-10-CM

## 2019-09-06 DIAGNOSIS — F419 Anxiety disorder, unspecified: Secondary | ICD-10-CM | POA: Diagnosis not present

## 2019-09-06 MED ORDER — ALPRAZOLAM 0.25 MG PO TABS: 0.2500 mg | ORAL_TABLET | Freq: Two times a day (BID) | ORAL | 0 refills | Status: DC | PRN

## 2019-09-06 MED ORDER — CYCLOBENZAPRINE HCL 10 MG PO TABS: 10.0000 mg | ORAL_TABLET | Freq: Three times a day (TID) | ORAL | 0 refills | Status: DC | PRN

## 2019-09-06 NOTE — Patient Instructions (Signed)
I think you have a muscle strain - I am going to prescribe you flexeril to take three times a day as needed. These medications will make you sleepy   I have also prescribed a low dose xanax to help with anxiety - take this as needed and do not take with Flexeril   You can schedule your physical after Dec 9th

## 2019-09-06 NOTE — Progress Notes (Signed)
Subjective:    Patient ID: Jason Summers, male    DOB: [birth year], 86 y.o.   MRN: [id number]  HPI  86 year old male who  has a past medical history of Acute rhinitis (08/10/2017), Acute stress disorder (10/27/2007), Anxiety (07/14/2018), Arthritis, Bilateral epiphora (09/25/2015), Blush (09/23/2007), Cancer ([place]), Chest pain in adult (12/16/2016), Chronic right-sided thoracic back pain (12/16/2016), Compression fracture of body of thoracic vertebra ([place]) (02/09/2019), Dysphagia (07/17/2010), Eustachian tube dysfunction, bilateral (08/10/2017), Ganglion of joint (04/22/2007), Gastroesophageal reflux disease (05/15/2016), Headache (01/30/2016), Hemorrhoids, HEMORRHOIDS, INTERNAL (02/18/2007), IPF (idiopathic pulmonary fibrosis) ([place]) (07/14/2018), Lateral epicondylitis (tennis elbow), Lingular mass (09/07/2015), Mass of lingula of lung (09/07/2015), Mass of lower lobe of left lung (09/07/2015), Mediastinal adenopathy (09/07/2015), Mediastinal lymphadenopathy (08/27/2015), Multiple pulmonary nodules (08/27/2015), Nausea and vomiting (05/15/2016), Non-small cell cancer of left lung ([place]) (09/24/2015), Non-small cell lung cancer (NSCLC) ([place]) (dx'f 07/30/15), Nuclear sclerosis of right eye (09/25/2015), Odynophagia (11/06/2015), Osteoarthritis (07/17/2010), Persistent headaches (02/03/2017), Pleural effusion on left (08/14/2016), Pseudophakia, left eye (09/25/2015), Pulmonary emphysema ([place]) (08/14/2016), Radiation fibrosis of lung ([place]) (08/14/2016), Shortness of breath (08/14/2016), Soft tissue lesion of shoulder region (04/22/2007), Spondylosis of cervical joint, and SPRAIN AND STRAIN OF SACROILIAC (09/06/2008).  He presents to the office today for an acute issue of right lower back pain with radiating pain into his buttocks.  He reports that the pain started approximately 3 to 4 days ago but has been becoming progressively worse.  He was unable to sleep last night due to the pain.  He reports the pain feels as sharp pain all as a stretching sensation  with certain range of motion.  Pain is worse with walking, laying, and bending at the waist.  He denies falls or traumatic injury.  He has been using a heating pad and his wife has been providing gentle massage without relief.  Additionally his wife reports that he has become more anxious has his breathing has become worse.  He will often have to pace around the house to help control his anxiety.  He agrees with this and contributes this to worsening breathing due to history of IPF.  Review of Systems See HPI   Past Medical History:  Diagnosis Date  . Acute rhinitis 08/10/2017  . Acute stress disorder 10/27/2007   Qualifier: Diagnosis of  By: Arnoldo Morale MD, Balinda Quails   Overview:  Overview:  Qualifier: Diagnosis of  By: Arnoldo Morale MD, Balinda Quails  . Anxiety 07/14/2018  . Arthritis   . Bilateral epiphora 09/25/2015  . Blush 09/23/2007   Qualifier: Diagnosis of  By: Arnoldo Morale MD, Balinda Quails   Overview:  Overview:  Qualifier: Diagnosis of  By: Arnoldo Morale MD, [facility])    basal cell on right temple  . Chest pain in adult 12/16/2016  . Chronic right-sided thoracic back pain 12/16/2016  . Compression fracture of body of thoracic vertebra ([place]) 02/09/2019  . Dysphagia 07/17/2010   Qualifier: Diagnosis of  By: Trellis Paganini PA-c, Amy S   . Eustachian tube dysfunction, bilateral 08/10/2017  . Ganglion of joint 04/22/2007   Qualifier: Diagnosis of  By: Arnoldo Morale MD, Balinda Quails   . Gastroesophageal reflux disease 05/15/2016  . Headache 01/30/2016  . Hemorrhoids   . HEMORRHOIDS, INTERNAL 02/18/2007   Qualifier: Diagnosis of  By: Arnoldo Morale MD, Balinda Quails   . IPF (idiopathic pulmonary fibrosis) ([place]) 07/14/2018   06/22/2018-CT chest with contrast- spectrum of findings suggestive of basilar predominant fibrotic interstitial lung disease with mild honeycombing asymmetrically involving the right lung  with significant progression since February/2019 in September/2019 chest CT.  Findings consistent with UIP.  06/22/2018-echocardiogram- LV ejection  fraction 55 to 62%, grade 1 diastolic dysfunction, PA P pressure 4  . Lateral epicondylitis (tennis elbow)   . Lingular mass 09/07/2015  . Mass of lingula of lung 09/07/2015  . Mass of lower lobe of left lung 09/07/2015  . Mediastinal adenopathy 09/07/2015  . Mediastinal lymphadenopathy 08/27/2015  . Multiple pulmonary nodules 08/27/2015  . Nausea and vomiting 05/15/2016  . Non-small cell cancer of left lung ([place]) 09/24/2015   09/13/2015: Left lower lobe lung biopsy: Overall, the histologic features, in conjunction with the positive staining for cytokeratin 903, cytokeratin 5/6, and p63 support a diagnosis of poorly differentiated squamous cell carcinoma. There is likely sufficient tumor remaining for additional studies, if requested. (JBK:ds 09/17/15)     . Non-small cell lung cancer (NSCLC) ([place]) dx'f 07/30/15  . Nuclear sclerosis of right eye 09/25/2015  . Odynophagia 11/06/2015  . Osteoarthritis 07/17/2010   Qualifier: Diagnosis of  By: Trellis Paganini PA-c, Amy S   . Persistent headaches 02/03/2017  . Pleural effusion on left 08/14/2016  . Pseudophakia, left eye 09/25/2015  . Pulmonary emphysema ([place]) 08/14/2016  . Radiation fibrosis of lung ([place]) 08/14/2016  . Shortness of breath 08/14/2016  . Soft tissue lesion of shoulder region 04/22/2007   Qualifier: Diagnosis of  By: Arnoldo Morale MD, Balinda Quails   Overview:  Overview:  Qualifier: Diagnosis of  By: Arnoldo Morale MD, Balinda Quails  . Spondylosis of cervical joint   . SPRAIN AND STRAIN OF SACROILIAC 09/06/2008   Qualifier: Diagnosis of  By: Arnoldo Morale MD, Balinda Quails     Social History   Socioeconomic History  . Marital status: Married    Spouse name: Not on file  . Number of children: Not on file  . Years of education: Not on file  . Highest education level: Not on file  Occupational History  . Occupation: Retired     Comment: Games developer  Tobacco Use  . Smoking status: Former Smoker    Packs/day: 0.20    Years: 62.00    Pack years: 12.40    Types: Cigarettes    Quit date:  03/10/2011    Years since quitting: 8.4  . Smokeless tobacco: Never Used  Substance and Sexual Activity  . Alcohol use: Yes    Alcohol/week: 7.0 standard drinks    Types: 7 Glasses of wine per week    Comment: 1-2 glasses of wine a day  . Drug use: No  . Sexual activity: Yes  Other Topics Concern  . Not on file  Social History Narrative  . Not on file   Social Determinants of Health   Financial Resource Strain:   . Difficulty of Paying Living Expenses:   Food Insecurity:   . Worried About Charity fundraiser in the Last Year:   . Arboriculturist in the Last Year:   Transportation Needs:   . Film/video editor (Medical):   Marland Kitchen Lack of Transportation (Non-Medical):   Physical Activity:   . Days of Exercise per Week:   . Minutes of Exercise per Session:   Stress:   . Feeling of Stress :   Social Connections:   . Frequency of Communication with Friends and Family:   . Frequency of Social Gatherings with Friends and Family:   . Attends Religious Services:   . Active Member of Clubs or Organizations:   . Attends Archivist Meetings:   .  Marital Status:   Intimate Partner Violence:   . Fear of Current or Ex-Partner:   . Emotionally Abused:   Marland Kitchen Physically Abused:   . Sexually Abused:     Past Surgical History:  Procedure Laterality Date  . COLONOSCOPY  2003  . KNEE ARTHROSCOPY  2006   right    Family History  Problem Relation Age of Onset  . Hypertension Mother   . Heart disease Mother   . Coronary artery disease Other     Allergies  Allergen Reactions  . Gentamicin Other (See Comments)    Made eyes red, swollen, hot feeling  . Loteprednol-Tobramycin Other (See Comments)    Other reaction(s): Other (See Comments) Made eyes Red, Swollen, Red, Warm feeling  Other reaction(s): Other (See Comments) Made eyes Red, Swollen, Red, Warm feeling  Made eyes Red, Swollen, Red, Warm feeling     Current Outpatient Medications on File Prior to Visit    Medication Sig Dispense Refill  . calcium carbonate (TUMS - DOSED IN MG ELEMENTAL CALCIUM) 500 MG chewable tablet Chew 1 tablet by mouth 2 (two) times daily as needed.     Marland Kitchen esomeprazole (NEXIUM) 40 MG capsule Take 1 capsule (40 mg total) by mouth daily. 90 capsule 3  . glucosamine-chondroitin 500-400 MG tablet Take 1 tablet by mouth daily. Reported on 09/11/2015    . OFEV 150 MG CAPS Take 1 capsule (150 mg total) by mouth 2 (two) times daily. 60 capsule 11  . predniSONE (DELTASONE) 10 MG tablet TAKE 1 1/2 TABLET BY MOUTH DAILY WITH BREAKFAST 44 tablet 3  . PROAIR HFA 108 (90 Base) MCG/ACT inhaler INHALE 2 PUFFS BY MOUTH INTO THE LUNGS EVERY 6 HOURS AS NEEDED FOR WHEEZING OR SHORTNESS OF BREATH 8.5 g 5  . rosuvastatin (CRESTOR) 5 MG tablet Take 1 tablet (5 mg total) by mouth at bedtime. 30 tablet 0  . tamsulosin (FLOMAX) 0.4 MG CAPS capsule Take 1 capsule (0.4 mg total) by mouth daily. 90 capsule 3  . Tiotropium Bromide-Olodaterol (STIOLTO RESPIMAT) 2.5-2.5 MCG/ACT AERS Inhale 2 puffs into the lungs daily. 8 g 0  . umeclidinium bromide (INCRUSE ELLIPTA) 62.5 MCG/INH AEPB Inhale 1 puff into the lungs daily. 90 each 3   No current facility-administered medications on file prior to visit.    BP 122/70   Pulse (!) 102   Temp (!) 97.1 F (36.2 C) (Temporal)   Wt 178 lb (80.7 kg)   SpO2 98%   BMI 28.73 kg/m       Objective:   Physical Exam Vitals and nursing note reviewed.  Constitutional:      Appearance: Normal appearance.  Cardiovascular:     Rate and Rhythm: Normal rate and regular rhythm.     Pulses: Normal pulses.     Heart sounds: Normal heart sounds.  Pulmonary:     Effort: Pulmonary effort is normal.     Breath sounds: Rales present.  Abdominal:     General: Abdomen is flat. Bowel sounds are normal.     Palpations: Abdomen is soft.     Tenderness: There is no abdominal tenderness.  Musculoskeletal:        General: No swelling, tenderness, deformity or signs of injury.  Normal range of motion.     Comments: No reproducible pain with palpation to the right lower back or right buttock.  No rash noted.  When bending and twisting at the waist he did report feeling a "pulling" sensation in his lower back.  Skin:  General: Skin is warm and dry.     Capillary Refill: Capillary refill takes less than 2 seconds.  Neurological:     General: No focal deficit present.     Mental Status: He is alert and oriented to person, place, and time.  Psychiatric:        Mood and Affect: Mood normal.        Behavior: Behavior normal.        Thought Content: Thought content normal.        Judgment: Judgment normal.       Assessment & Plan:  1. Muscle strain -Exam consistent with muscle strain.  Does not appear to be sciatic.  Advised to continue with warm compress and can use Flexeril as needed.  He was advised that this medication will make him feel sleepy - cyclobenzaprine (FLEXERIL) 10 MG tablet; Take 1 tablet (10 mg total) by mouth 3 (three) times daily as needed for muscle spasms.  Dispense: 15 tablet; Refill: 0  2. Anxiety -Prescribe low-dose Xanax.  He was advised that this medication can make him drowsy to start after using Flexeril for the muscle drain.  Follow-up if anxiety is not better controlled - ALPRAZolam (XANAX) 0.25 MG tablet; Take 1 tablet (0.25 mg total) by mouth 2 (two) times daily as needed for anxiety.  Dispense: 45 tablet; Refill: 0  [name], NP

## 2019-09-07 ENCOUNTER — Telehealth: Payer: Self-pay | Admitting: Family Medicine

## 2019-09-07 NOTE — Telephone Encounter (Signed)
Pt seen on 09/06/19.  Received fax on 09/07/19 that pt needs PA through Cover My Meds.  Spoke to Mrs. Febles and no PA is needed.  Rx was paid for out of pocket for a total of $15.  Nothing further needed.

## 2019-09-15 ENCOUNTER — Other Ambulatory Visit: Payer: Self-pay | Admitting: Adult Health

## 2019-09-15 ENCOUNTER — Other Ambulatory Visit (HOSPITAL_COMMUNITY)
Admission: RE | Admit: 2019-09-15 | Discharge: 2019-09-15 | Disposition: A | Discharge status: Home / Self Care | Payer: Medicare Other | Source: Ambulatory Visit | Attending: Pulmonary Disease | Admitting: Pulmonary Disease

## 2019-09-15 ENCOUNTER — Other Ambulatory Visit (HOSPITAL_COMMUNITY): Payer: Medicare Other

## 2019-09-15 DIAGNOSIS — Z20822 Contact with and (suspected) exposure to covid-19: Secondary | ICD-10-CM | POA: Insufficient documentation

## 2019-09-15 DIAGNOSIS — Z01812 Encounter for preprocedural laboratory examination: Secondary | ICD-10-CM | POA: Diagnosis present

## 2019-09-15 LAB — SARS CORONAVIRUS 2 (TAT 6-24 HRS): SARS Coronavirus 2: NEGATIVE

## 2019-09-15 NOTE — Telephone Encounter (Signed)
SENT TO THE PHARMACY BY E-SCRIBE.

## 2019-09-19 ENCOUNTER — Ambulatory Visit: Payer: Medicare Other | Admitting: Pulmonary Disease

## 2019-09-19 ENCOUNTER — Ambulatory Visit (INDEPENDENT_AMBULATORY_CARE_PROVIDER_SITE_OTHER): Payer: Medicare Other | Admitting: Pulmonary Disease

## 2019-09-19 ENCOUNTER — Telehealth: Payer: Self-pay | Admitting: Pulmonary Disease

## 2019-09-19 ENCOUNTER — Ambulatory Visit (INDEPENDENT_AMBULATORY_CARE_PROVIDER_SITE_OTHER): Payer: Medicare Other

## 2019-09-19 ENCOUNTER — Encounter: Payer: Self-pay | Admitting: Pulmonary Disease

## 2019-09-19 ENCOUNTER — Other Ambulatory Visit: Payer: Self-pay

## 2019-09-19 ENCOUNTER — Ambulatory Visit: Payer: Medicare Other

## 2019-09-19 VITALS — BP 110/70 | HR 79 | Temp 97.1°F | Ht 66.0 in | Wt 172.6 lb

## 2019-09-19 DIAGNOSIS — J439 Emphysema, unspecified: Secondary | ICD-10-CM

## 2019-09-19 DIAGNOSIS — Z7189 Other specified counseling: Secondary | ICD-10-CM | POA: Diagnosis not present

## 2019-09-19 DIAGNOSIS — R7989 Other specified abnormal findings of blood chemistry: Secondary | ICD-10-CM

## 2019-09-19 DIAGNOSIS — R079 Chest pain, unspecified: Secondary | ICD-10-CM

## 2019-09-19 DIAGNOSIS — R0602 Shortness of breath: Secondary | ICD-10-CM

## 2019-09-19 DIAGNOSIS — J84112 Idiopathic pulmonary fibrosis: Secondary | ICD-10-CM

## 2019-09-19 DIAGNOSIS — Z5181 Encounter for therapeutic drug level monitoring: Secondary | ICD-10-CM

## 2019-09-19 LAB — D-DIMER, QUANTITATIVE: D-Dimer, Quant: 2.19 mcg/mL FEU — ABNORMAL HIGH (ref <0.50)

## 2019-09-19 LAB — CBC WITH DIFFERENTIAL/PLATELET
Basophils Absolute: 0.1 10*3/uL (ref 0.0–0.1)
Basophils Relative: 0.6 % (ref 0.0–3.0)
Eosinophils Absolute: 0.1 10*3/uL (ref 0.0–0.7)
Eosinophils Relative: 1.2 % (ref 0.0–5.0)
HCT: 43.8 % (ref 39.0–52.0)
Hemoglobin: 14.8 g/dL (ref 13.0–17.0)
Lymphocytes Relative: 12.2 % (ref 12.0–46.0)
Lymphs Abs: 1.3 10*3/uL (ref 0.7–4.0)
MCHC: 33.8 g/dL (ref 30.0–36.0)
MCV: 90 fl (ref 78.0–100.0)
Monocytes Absolute: 0.9 10*3/uL (ref 0.1–1.0)
Monocytes Relative: 8 % (ref 3.0–12.0)
Neutro Abs: 8.6 10*3/uL — ABNORMAL HIGH (ref 1.4–7.7)
Neutrophils Relative %: 78 % — ABNORMAL HIGH (ref 43.0–77.0)
Platelets: 242 10*3/uL (ref 150.0–400.0)
RBC: 4.87 Mil/uL (ref 4.22–5.81)
RDW: 14.6 % (ref 11.5–15.5)
WBC: 11 10*3/uL — ABNORMAL HIGH (ref 4.0–10.5)

## 2019-09-19 LAB — COMPREHENSIVE METABOLIC PANEL
ALT: 16 U/L (ref 0–53)
AST: 14 U/L (ref 0–37)
Albumin: 4 g/dL (ref 3.5–5.2)
Alkaline Phosphatase: 70 U/L (ref 39–117)
BUN: 24 mg/dL — ABNORMAL HIGH (ref 6–23)
CO2: 31 mEq/L (ref 19–32)
Calcium: 9.3 mg/dL (ref 8.4–10.5)
Chloride: 100 mEq/L (ref 96–112)
Creatinine, Ser: 1.06 mg/dL (ref 0.40–1.50)
GFR: 66.28 mL/min (ref >60.00)
Glucose, Bld: 101 mg/dL — ABNORMAL HIGH (ref 70–99)
Potassium: 4.5 mEq/L (ref 3.5–5.1)
Sodium: 138 mEq/L (ref 135–145)
Total Bilirubin: 0.5 mg/dL (ref 0.2–1.2)
Total Protein: 7.1 g/dL (ref 6.0–8.3)

## 2019-09-19 NOTE — Progress Notes (Signed)
Discussed results with patient in office.  Nothing further is needed at this time.  Wyn Quaker FNP

## 2019-09-19 NOTE — Assessment & Plan Note (Signed)
During exam patient and spouse and I discussed other supportive measures that could be offered for the patient such as palliative care or hospice.  They are open to discussing this but would like to proceed forward with other interventions at this time.  They do not want to proceed forward with palliative care or hospice.  We will continue to discuss this at future office visits to see how we can best support the patient.

## 2019-09-19 NOTE — Patient Instructions (Addendum)
You were seen today by Lauraine Rinne, NP  for:   1. IPF (idiopathic pulmonary fibrosis) ([place])  - DG Chest 2 View; Future - Pro b natriuretic peptide (BNP); Future - D-dimer, quantitative (not at [facility]); Future - Comp Met (CMET); Future - CBC with Differential/Platelet; Future  Continue OFEV   Refer back to cardiology - call and schedule appt for Right Heart Cath - you likely would qualify for [place]   Continue Prednisone   2. Shortness of breath  - Pro b natriuretic peptide (BNP); Future - D-dimer, quantitative (not at [facility]); Future  Walk today   Chest Xray today   Labs today   We recommend today:  Orders Placed This Encounter  Procedures  . DG Chest 2 View    Standing Status:   Future    Number of Occurrences:   1    Standing Expiration Date:   11/18/2020    Order Specific Question:   Reason for Exam (SYMPTOM  OR DIAGNOSIS REQUIRED)    Answer:   short of breath, severe ipf    Order Specific Question:   Preferred imaging location?    Answer:   Internal    Order Specific Question:   Radiology Contrast Protocol - do NOT remove file path    Answer:   [email or web address]  . Pro b natriuretic peptide (BNP)    Standing Status:   Future    Standing Expiration Date:   09/18/2020  . D-dimer, quantitative (not at [facility])    Standing Status:   Future    Standing Expiration Date:   09/18/2020  . Comp Met (CMET)    Standing Status:   Future    Standing Expiration Date:   09/18/2020  . CBC with Differential/Platelet    Standing Status:   Future    Standing Expiration Date:   09/18/2020   Orders Placed This Encounter  Procedures  . DG Chest 2 View  . Pro b natriuretic peptide (BNP)  . D-dimer, quantitative (not at [facility])  . Comp Met (CMET)  . CBC with Differential/Platelet   No orders of the defined types were placed in this encounter.   Follow Up:    Return in about 4 weeks (around 10/17/2019), or if symptoms worsen or fail to improve,  for Follow up with Dr. Purnell Shoemaker - ILD clinic slot .   Please do your part to reduce the spread of COVID-19:      Reduce your risk of any infection  and COVID19 by using the similar precautions used for avoiding the common cold or flu:  Marland Kitchen Wash your hands often with soap and warm water for at least 20 seconds.  If soap and water are not readily available, use an alcohol-based hand sanitizer with at least 60% alcohol.  . If coughing or sneezing, cover your mouth and nose by coughing or sneezing into the elbow areas of your shirt or coat, into a tissue or into your sleeve (not your hands). Langley Gauss A MASK when in public  . Avoid shaking hands with others and consider head nods or verbal greetings only. . Avoid touching your eyes, nose, or mouth with unwashed hands.  . Avoid close contact with people who are sick. . Avoid places or events with large numbers of people in one location, like concerts or sporting events. . If you have some symptoms but not all symptoms, continue to monitor at home and seek medical attention if your symptoms worsen. . If you are having a  medical emergency, call 911.   [place] / e-Visit: [email or web address]         [facility]: [facility]: [phone]                   [facility]: [phone]     It is flu season:   >>> Best ways to protect herself from the flu: Receive the yearly flu vaccine, practice good hand hygiene washing with soap and also using hand sanitizer when available, eat a nutritious meals, get adequate rest, hydrate appropriately   Please contact the office if your symptoms worsen or you have concerns that you are not improving.   Thank you for choosing [facility] for your healthcare, and for allowing [place] to partner with you on your healthcare journey. I am thankful to be  able to provide care to you today.   Wyn Quaker FNP-C

## 2019-09-19 NOTE — Assessment & Plan Note (Signed)
Acute on chronic worsening shortness of breath This is likely multifactorial given patient's physical deconditioning, IPF Walk in office today, patient able to complete 3 laps dropping to 88% on third lab Chest x-ray today shows fibrotic changes Diminished breath sounds on left lung exam today  Plan: Chest x-ray today Walk today Lab work today Patient needs to go back to cardiology likely would benefit from right heart cath to see if they would qualify for Tyvaso

## 2019-09-19 NOTE — Telephone Encounter (Signed)
09/19/2019  Patient's D-dimer is elevated on lab work today.  We will order a CTA chest to further evaluate as well as lower extremity Dopplers.   Called and spoke with pts spouse.  She is aware of the lab results.  She is aware that we are ordering a CTA chest as well as lower extremity Dopplers.  I believe this is reasonable given the patient's progressive dyspnea as well as significantly elevated D-dimer.  This will help rule out any sort of acute worsened symptoms contributing to the patient's shortness of breath.  Difficult to fully assess chest x-ray given the severe fibrosis.  Routing to [facility] pool.  Patient spouse is aware this likely will not be scheduled till 09/20/2019.  They are aware to seek emergent care if symptoms worsen in the meantime.  Wyn Quaker, FNP

## 2019-09-19 NOTE — Assessment & Plan Note (Signed)
Patient did not like Stiolto Respimat  Plan: Continue Incruse

## 2019-09-19 NOTE — Progress Notes (Signed)
See telephone note.  Called and spoke with spouse.  CT and lower extremity Dopplers ordered.Wyn Quaker, FNP

## 2019-09-19 NOTE — Assessment & Plan Note (Addendum)
Assessment: UIP on January/2020 CT chest Currently managed on 0FEV Currently managed on 15 mg of prednisone daily January/2020 echocardiogram does show elevated pulmonary pressures  Plan: Chest x-ray today Lab work today Instructed patient to follow back up with cardiology I believe they need to consider right heart cath to see if you would qualify for Tyvaso Continue 79m prednisone daily  Continue OFEV  4-week follow-up with our office, patient on list to see Dr. RChase Caller  If symptoms persist will need to have discussion of goals of care and potential consideration of referral to palliative

## 2019-09-19 NOTE — Progress Notes (Signed)
_0  ID: Jason Summers, male    DOB: Nov 30, 1931, 86 y.o.   MRN: [id number]  Chief Complaint  Patient presents with  . Follow-up    Patient states that he is to short of breath to do the breathing test today. Patient states that since last visit his breathing has got worse. Patient says the Stiolto didn't do anything for him and he didn't like it so went back to using Incruse Ellipta. Patient states that breathing is ok when sitting and resting but with any exertion he is worse.     Referring provider: [name], NP  HPI:  86 year old male former smoker followed in our office for idiopathic pulmonary fibrosis  PMH: GERD, lung cancer, radiation fibrosis of the lung, esophageal strictures, chest pain, headaches Smoker/ Smoking History: Former smoker Maintenance: [place] Hard Ellipta  Pt of: Dr. Chase Caller   09/19/2019  - Visit   86 year old male former smoker followed in our office for IPF.  Patient presenting today in order to complete a pulmonary function test.  Unfortunately patient was unable to complete the pulmonary function test.  Patient reporting that his lung functioning is worsened over the last 8 weeks.  Spouse is present with the patient today and she agrees that its worse.  Patient was trialed on Stiolto Respimat at last office visit.  Patient reports that he utilize this but then did not prefer that inhaler.  He stopped using Stiolto Respimat and resumed Incruse Ellipta.  Patient walked in office today.  He was able to complete 3 laps.  Oxygen saturations dropped to 88% on room air on third lap.  Questionaires / Pulmonary Flowsheets:   MMRC: mMRC Dyspnea Scale mMRC Score  09/19/2019 3  08/10/2019 2   Tests:   06/22/2018-CT chest with contrast- spectrum of findings suggestive of basilar predominant fibrotic interstitial lung disease with mild honeycombing asymmetrically involving the right lung with significant progression since February/2019 in September/2019 chest  CT.  Findings consistent with UIP.  06/22/2018-echocardiogram- LV ejection fraction 55 to 99%, grade 1 diastolic dysfunction, PAP pressure 45  ONO 06/17/2018 - < 88% for 37 min only.  07/12/2019-PET scan-no evidence of lung cancer recurrence, post therapy changes to the left lung, small hypermetabolic nodule along the anterior left upper lobe fluid collection is favored inflammatory, recommend attention on more short-term follow-up, small hydropneumothorax at left lung base, questionable chronic empyema, increased bilateral subpleural nodule reticulation and mild metabolic activity is favored related to pulmonary fibrosis  09/19/2019-chest x-ray-underlying pulmonary fibrotic type change, areas of superimposed airspace opacity bilaterally stable with probable post radiation therapy change in left upper lobe region.  No new opacity evident, stable cardiac silhouette  FENO:  No results found for: NITRICOXIDE  PFT: No flowsheet data found.  WALK:  SIX MIN WALK 09/19/2019 08/10/2019 06/30/2019 03/28/2019 01/26/2019 12/03/2018 11/03/2018  Supplimental Oxygen during Test? (L/min) Yes _1  No  O2 Flow Rate 2 - - - - - -  Type Continuous - - - - - -  2 Minute Oxygen Saturation % - - - - - - -  2 Minute HR - - - - - - -  4 Minute Oxygen Saturation % - - - - - - -  4 Minute HR - - - - - - -  6 Minute Oxygen Saturation % - - - - - - -  6 Minute HR - - - - - - -  Tech Comments: Patient walked average pace. Patients O2 dropped and he  was placed on 2 liters oxygen and maintained good O2 sats. Patient was able to complete 2 laps at a steady pace. He did have some SOB after 2nd lap. No O2 needed during or after walk. Patient walked a slow steady pace and did not have to stop to sit or rest during the walk. The patient denied chest tightness, dizziness or shortness of breath. However, could hear he was breathing harder during 1/2 mark of second lap and during entire 3rd lap. Walked a slow rate, reports his  baseline SOB.Denies chest pain. Tolerated well. Patient was able to maintain a steady walking pace and did not have to stop at any time during the walk. Pt. did well with walk, pt. did get sob on last half of 3rd lap. Pt. rest was O:99   P:74  ending O:90   P:100 ///pmc Pt walked slow to moderste pace tolkerated well, was winded by lap 3. No c/o dizziness or lighheadedness.    Imaging: DG Chest 2 View  Result Date: 09/19/2019 CLINICAL DATA:  Known pulmonary fibrosis. Shortness of breath. History of lung carcinoma EXAM: CHEST - 2 VIEW COMPARISON:  December 03, 2018 chest radiograph; PET-CT July 12, 2019; chest CT January 13, 2019 FINDINGS: There is extensive underlying fibrosis throughout the lungs bilaterally. Areas of ill-defined airspace opacity are noted in the left upper lobe as well as in each lower lung region. No new opacity evident. Heart size is normal. There is distortion of pulmonary vascularity bilaterally due to the fibrosis. No adenopathy is appreciable by radiography. There remains anterior wedging of the T6 vertebral body, stable. IMPRESSION: Underlying pulmonary fibrotic type change. Areas of superimposed airspace opacity bilaterally, stable, with probable post radiation therapy change in the left upper lobe region. No new opacity evident. Stable cardiac silhouette. Stable wedging of the T6 vertebral body. Electronically Signed   By: Lowella Grip III M.D.   On: 09/19/2019 10:28    Lab Results:  CBC    Component Value Date/Time   WBC 11.0 (H) 09/19/2019 1105   RBC 4.87 09/19/2019 1105   HGB 14.8 09/19/2019 1105   HGB 13.1 02/12/2018 0841   HGB 14.3 01/27/2017 0826   HCT 43.8 09/19/2019 1105   HCT 43.0 01/27/2017 0826   PLT 242.0 09/19/2019 1105   PLT 214 02/12/2018 0841   PLT 183 01/27/2017 0826   MCV 90.0 09/19/2019 1105   MCV 86.4 01/27/2017 0826   MCH 28.7 02/12/2018 0841   MCHC 33.8 09/19/2019 1105   RDW 14.6 09/19/2019 1105   RDW 14.5 01/27/2017 0826   LYMPHSABS  1.3 09/19/2019 1105   LYMPHSABS 1.2 01/27/2017 0826   MONOABS 0.9 09/19/2019 1105   MONOABS 0.6 01/27/2017 0826   EOSABS 0.1 09/19/2019 1105   EOSABS 0.1 01/27/2017 0826   BASOSABS 0.1 09/19/2019 1105   BASOSABS 0.0 01/27/2017 0826    BMET    Component Value Date/Time   NA 138 09/19/2019 1105   NA 139 01/27/2017 0826   K 4.5 09/19/2019 1105   K 4.4 01/27/2017 0826   CL 100 09/19/2019 1105   CO2 31 09/19/2019 1105   CO2 25 01/27/2017 0826   GLUCOSE 101 (H) 09/19/2019 1105   GLUCOSE 102 01/27/2017 0826   BUN 24 (H) 09/19/2019 1105   BUN 15.5 01/27/2017 0826   CREATININE 1.06 09/19/2019 1105   CREATININE 0.90 02/12/2018 0841   CREATININE 0.9 01/27/2017 0826   CALCIUM 9.3 09/19/2019 1105   CALCIUM 9.1 01/27/2017 0826   GFRNONAA >  60 02/12/2018 0841   GFRAA >60 02/12/2018 0841    BNP    Component Value Date/Time   BNP 52.0 08/21/2015 1206    ProBNP No results found for: PROBNP  Specialty Problems      Pulmonary Problems   Multiple pulmonary nodules   Mass of lingula of lung   Non-small cell cancer of left lung ([place])    09/13/2015: Left lower lobe lung biopsy: Overall, the histologic features, in conjunction with the positive staining for cytokeratin 903, cytokeratin 5/6, and p63 support a diagnosis of poorly differentiated squamous cell carcinoma. There is likely sufficient tumor remaining for additional studies, if requested. (JBK:ds 09/17/15)        Pleural effusion on left   Pulmonary emphysema (HCC)   Radiation fibrosis of lung (HCC)   Shortness of breath   Acute rhinitis   IPF (idiopathic pulmonary fibrosis) ([place])    06/22/2018-CT chest with contrast- spectrum of findings suggestive of basilar predominant fibrotic interstitial lung disease with mild honeycombing asymmetrically involving the right lung with significant progression since February/2019 in September/2019 chest CT.  Findings consistent with UIP.  06/22/2018-echocardiogram- LV ejection fraction 55  to 41%, grade 1 diastolic dysfunction, PA P pressure 45  ONO 06/17/2018 - < 88% for 37 min only.  07/07/2018 - started OFEV          Allergies  Allergen Reactions  . Gentamicin Other (See Comments)    Made eyes red, swollen, hot feeling  . Loteprednol-Tobramycin Other (See Comments)    Other reaction(s): Other (See Comments) Made eyes Red, Swollen, Red, Warm feeling  Other reaction(s): Other (See Comments) Made eyes Red, Swollen, Red, Warm feeling  Made eyes Red, Swollen, Red, Warm feeling     Immunization History  Administered Date(s) Administered  . Fluad Quad(high Dose 65+) 02/16/2019  . Influenza Split 03/19/2011, 03/12/2012  . Influenza Whole 06/09/1997, 04/22/2007, 03/14/2008, 03/28/2009, 05/09/2010  . Influenza, High Dose Seasonal PF 04/14/2013, 04/11/2015, 03/02/2017, 03/23/2018  . Influenza,inj,Quad PF,6+ Mos 05/07/2016  . Influenza-Unspecified 04/10/2014  . PFIZER SARS-COV-2 Vaccination 07/01/2019, 07/23/2019  . Pneumococcal Conjugate-13 10/14/2013  . Pneumococcal Polysaccharide-23 06/09/2004, 10/04/2012  . Td 06/09/2005  . Zoster 02/04/2015    Past Medical History:  Diagnosis Date  . Acute rhinitis 08/10/2017  . Acute stress disorder 10/27/2007   Qualifier: Diagnosis of  By: Arnoldo Morale MD, Balinda Quails   Overview:  Overview:  Qualifier: Diagnosis of  By: Arnoldo Morale MD, Balinda Quails  . Anxiety 07/14/2018  . Arthritis   . Bilateral epiphora 09/25/2015  . Blush 09/23/2007   Qualifier: Diagnosis of  By: Arnoldo Morale MD, Balinda Quails   Overview:  Overview:  Qualifier: Diagnosis of  By: Arnoldo Morale MD, [facility])    basal cell on right temple  . Chest pain in adult 12/16/2016  . Chronic right-sided thoracic back pain 12/16/2016  . Compression fracture of body of thoracic vertebra ([place]) 02/09/2019  . Dysphagia 07/17/2010   Qualifier: Diagnosis of  By: Trellis Paganini PA-c, Amy S   . Eustachian tube dysfunction, bilateral 08/10/2017  . Ganglion of joint 04/22/2007   Qualifier: Diagnosis of  By: Arnoldo Morale  MD, Balinda Quails   . Gastroesophageal reflux disease 05/15/2016  . Headache 01/30/2016  . Hemorrhoids   . HEMORRHOIDS, INTERNAL 02/18/2007   Qualifier: Diagnosis of  By: Arnoldo Morale MD, Balinda Quails   . IPF (idiopathic pulmonary fibrosis) ([place]) 07/14/2018   06/22/2018-CT chest with contrast- spectrum of findings suggestive of basilar predominant fibrotic interstitial lung disease with mild  honeycombing asymmetrically involving the right lung with significant progression since February/2019 in September/2019 chest CT.  Findings consistent with UIP.  06/22/2018-echocardiogram- LV ejection fraction 55 to 63%, grade 1 diastolic dysfunction, PA P pressure 4  . Lateral epicondylitis (tennis elbow)   . Lingular mass 09/07/2015  . Mass of lingula of lung 09/07/2015  . Mass of lower lobe of left lung 09/07/2015  . Mediastinal adenopathy 09/07/2015  . Mediastinal lymphadenopathy 08/27/2015  . Multiple pulmonary nodules 08/27/2015  . Nausea and vomiting 05/15/2016  . Non-small cell cancer of left lung ([place]) 09/24/2015   09/13/2015: Left lower lobe lung biopsy: Overall, the histologic features, in conjunction with the positive staining for cytokeratin 903, cytokeratin 5/6, and p63 support a diagnosis of poorly differentiated squamous cell carcinoma. There is likely sufficient tumor remaining for additional studies, if requested. (JBK:ds 09/17/15)     . Non-small cell lung cancer (NSCLC) ([place]) dx'f 07/30/15  . Nuclear sclerosis of right eye 09/25/2015  . Odynophagia 11/06/2015  . Osteoarthritis 07/17/2010   Qualifier: Diagnosis of  By: Trellis Paganini PA-c, Amy S   . Persistent headaches 02/03/2017  . Pleural effusion on left 08/14/2016  . Pseudophakia, left eye 09/25/2015  . Pulmonary emphysema ([place]) 08/14/2016  . Radiation fibrosis of lung ([place]) 08/14/2016  . Shortness of breath 08/14/2016  . Soft tissue lesion of shoulder region 04/22/2007   Qualifier: Diagnosis of  By: Arnoldo Morale MD, Balinda Quails   Overview:  Overview:  Qualifier: Diagnosis of  By: Arnoldo Morale  MD, Balinda Quails  . Spondylosis of cervical joint   . SPRAIN AND STRAIN OF SACROILIAC 09/06/2008   Qualifier: Diagnosis of  By: Arnoldo Morale MD, Balinda Quails     Tobacco History: Social History   Tobacco Use  Smoking Status Former Smoker  . Packs/day: 0.20  . Years: 62.00  . Pack years: 12.40  . Types: Cigarettes  . Quit date: 03/10/2011  . Years since quitting: 8.5  Smokeless Tobacco Never Used   Counseling given: Yes   Continue to not smoke  Outpatient Encounter Medications as of 09/19/2019  Medication Sig  . ALPRAZolam (XANAX) 0.25 MG tablet Take 1 tablet (0.25 mg total) by mouth 2 (two) times daily as needed for anxiety.  . calcium carbonate (TUMS - DOSED IN MG ELEMENTAL CALCIUM) 500 MG chewable tablet Chew 1 tablet by mouth 2 (two) times daily as needed.   . cyclobenzaprine (FLEXERIL) 10 MG tablet Take 1 tablet (10 mg total) by mouth 3 (three) times daily as needed for muscle spasms.  Marland Kitchen esomeprazole (NEXIUM) 40 MG capsule Take 1 capsule (40 mg total) by mouth daily.  Marland Kitchen glucosamine-chondroitin 500-400 MG tablet Take 1 tablet by mouth daily. Reported on 09/11/2015  . OFEV 150 MG CAPS Take 1 capsule (150 mg total) by mouth 2 (two) times daily.  . predniSONE (DELTASONE) 10 MG tablet TAKE 1 1/2 TABLET BY MOUTH DAILY WITH BREAKFAST  . PROAIR HFA 108 (90 Base) MCG/ACT inhaler INHALE 2 PUFFS BY MOUTH INTO THE LUNGS EVERY 6 HOURS AS NEEDED FOR WHEEZING OR SHORTNESS OF BREATH  . rosuvastatin (CRESTOR) 5 MG tablet TAKE 1 TABLET(5 MG) BY MOUTH DAILY  . tamsulosin (FLOMAX) 0.4 MG CAPS capsule Take 1 capsule (0.4 mg total) by mouth daily.  Marland Kitchen umeclidinium bromide (INCRUSE ELLIPTA) 62.5 MCG/INH AEPB Inhale 1 puff into the lungs daily.  . [DISCONTINUED] Tiotropium Bromide-Olodaterol (STIOLTO RESPIMAT) 2.5-2.5 MCG/ACT AERS Inhale 2 puffs into the lungs daily.   No facility-administered encounter medications on file as of 09/19/2019.  Review of Systems  Review of Systems  Constitutional: Positive for  fatigue. Negative for activity change, chills, fever and unexpected weight change.  HENT: Negative for postnasal drip, rhinorrhea, sinus pressure, sinus pain and sore throat.   Eyes: Negative.   Respiratory: Positive for shortness of breath. Negative for cough and wheezing.   Cardiovascular: Negative for chest pain and palpitations.  Gastrointestinal: Negative for constipation, diarrhea, nausea and vomiting.  Endocrine: Negative.   Genitourinary: Negative.   Musculoskeletal: Negative.   Skin: Negative.   Neurological: Negative for dizziness and headaches.  Psychiatric/Behavioral: Negative.  Negative for dysphoric mood. The patient is not nervous/anxious.   All other systems reviewed and are negative.    Physical Exam  BP 110/70 (BP Location: Left Arm, Patient Position: Sitting, Cuff Size: Normal)   Pulse 79   Temp (!) 97.1 F (36.2 C) (Temporal)   Ht _0  (1.676 m)   Wt 172 lb 9.6 oz (78.3 kg)   SpO2 98%   BMI 27.86 kg/m   Wt Readings from Last 5 Encounters:  09/19/19 172 lb 9.6 oz (78.3 kg)  09/06/19 178 lb (80.7 kg)  08/10/19 174 lb (78.9 kg)  06/30/19 173 lb (78.5 kg)  05/18/19 171 lb (77.6 kg)    BMI Readings from Last 5 Encounters:  09/19/19 27.86 kg/m  09/06/19 28.73 kg/m  08/10/19 28.08 kg/m  06/30/19 27.92 kg/m  05/18/19 26.19 kg/m     Physical Exam Vitals and nursing note reviewed.  Constitutional:      General: He is not in acute distress.    Appearance: Normal appearance. He is normal weight.  HENT:     Head: Normocephalic and atraumatic.     Right Ear: Hearing, tympanic membrane, ear canal and external ear normal.     Left Ear: Hearing, tympanic membrane, ear canal and external ear normal.     Nose: Nose normal. No mucosal edema or rhinorrhea.     Right Turbinates: Not enlarged.     Left Turbinates: Not enlarged.     Mouth/Throat:     Mouth: Mucous membranes are dry.     Pharynx: Oropharynx is clear. No oropharyngeal exudate.  Eyes:      Pupils: Pupils are equal, round, and reactive to light.  Cardiovascular:     Rate and Rhythm: Normal rate and regular rhythm.     Pulses: Normal pulses.     Heart sounds: Normal heart sounds. No murmur.  Pulmonary:     Effort: Pulmonary effort is normal.     Breath sounds: Decreased air movement present. Examination of the right-upper field reveals rales. Examination of the left-upper field reveals decreased breath sounds. Examination of the right-middle field reveals rales. Examination of the left-middle field reveals decreased breath sounds and rales. Examination of the right-lower field reveals rales. Examination of the left-lower field reveals decreased breath sounds and rales. Decreased breath sounds and rales present. No wheezing.  Abdominal:     General: Bowel sounds are normal. There is no distension.     Palpations: Abdomen is soft.     Tenderness: There is no abdominal tenderness.     Comments: ?  Abdominal edema  Musculoskeletal:     Cervical back: Normal range of motion.     Right lower leg: No edema.     Left lower leg: No edema.  Lymphadenopathy:     Cervical: No cervical adenopathy.  Skin:    General: Skin is warm and dry.     Capillary Refill: Capillary refill  takes less than 2 seconds.     Findings: No erythema or rash.  Neurological:     General: No focal deficit present.     Mental Status: He is alert and oriented to person, place, and time.     Motor: No weakness.     Coordination: Coordination normal.     Gait: Gait is intact. Gait (Tolerated walk in office) normal.  Psychiatric:        Mood and Affect: Mood normal.        Behavior: Behavior normal. Behavior is cooperative.        Thought Content: Thought content normal.        Judgment: Judgment normal.       Assessment & Plan:   Discussed case with Dr. Chase Caller in clinic today.  Dr. Chase Caller and I both agreed patient would benefit from right heart cath.  Patient will need to discuss this with  cardiology.  If patient is found to have pulmonary hypertension on right heart cath which was reflected on the echocardiogram in January/2020 patient may be good candidate for Tyvaso which is recently received approval to be utilized in this case.  IPF (idiopathic pulmonary fibrosis) (HCC) Assessment: UIP on January/2020 CT chest Currently managed on 0FEV Currently managed on 15 mg of prednisone daily January/2020 echocardiogram does show elevated pulmonary pressures  Plan: Chest x-ray today Lab work today Instructed patient to follow back up with cardiology I believe they need to consider right heart cath to see if you would qualify for Tyvaso Continue 52m prednisone daily  Continue OFEV  4-week follow-up with our office, patient on list to see Dr. RChase Caller  If symptoms persist will need to have discussion of goals of care and potential consideration of referral to palliative  Pulmonary emphysema ([facility] Patient did not like Stiolto Respimat  Plan: Continue Incruse  Shortness of breath Acute on chronic worsening shortness of breath This is likely multifactorial given patient's physical deconditioning, IPF Walk in office today, patient able to complete 3 laps dropping to 88% on third lab Chest x-ray today shows fibrotic changes Diminished breath sounds on left lung exam today  Plan: Chest x-ray today Walk today Lab work today Patient needs to go back to cardiology likely would benefit from right heart cath to see if they would qualify for Tyvaso   Coordination of complex care During exam patient and spouse and I discussed other supportive measures that could be offered for the patient such as palliative care or hospice.  They are open to discussing this but would like to proceed forward with other interventions at this time.  They do not want to proceed forward with palliative care or hospice.  We will continue to discuss this at future office visits to see how we can best  support the patient.    Return in about 4 weeks (around 10/17/2019), or if symptoms worsen or fail to improve, for Follow up with Dr. RPurnell Shoemaker- [facility] clinic slot .   BLauraine Rinne NP 09/19/2019   This appointment required 45 minutes of patient care (this includes precharting, chart review, review of results, face-to-face care, etc.).

## 2019-09-20 ENCOUNTER — Ambulatory Visit (INDEPENDENT_AMBULATORY_CARE_PROVIDER_SITE_OTHER)
Admission: RE | Admit: 2019-09-20 | Discharge: 2019-09-20 | Disposition: A | Discharge status: Home / Self Care | Payer: Medicare Other | Source: Ambulatory Visit | Attending: Pulmonary Disease | Admitting: Pulmonary Disease

## 2019-09-20 DIAGNOSIS — R0602 Shortness of breath: Secondary | ICD-10-CM | POA: Diagnosis not present

## 2019-09-20 DIAGNOSIS — R7989 Other specified abnormal findings of blood chemistry: Secondary | ICD-10-CM

## 2019-09-20 LAB — PRO B NATRIURETIC PEPTIDE: NT-Pro BNP: 275 pg/mL (ref 0–486)

## 2019-09-20 MED ORDER — IOHEXOL 350 MG/ML SOLN
80.0000 mL | Freq: Once | INTRAVENOUS | Status: AC | PRN
  Administered 2019-09-20: 80 mL via INTRAVENOUS

## 2019-09-20 NOTE — Progress Notes (Signed)
Patient is unable to communicate, wife verified identification. Results of recent CTA provided. Per Wyn Quaker FNP, CTA is negative for PE. Wife verbalized understanding of results.

## 2019-09-20 NOTE — Telephone Encounter (Signed)
I have scheduled pt for CT angio today at 1:00 and he will have vascular ultrasound in the morning at 9:00.  I have given appt info to pt's wife.  Nothing further needed.

## 2019-09-21 ENCOUNTER — Other Ambulatory Visit: Payer: Self-pay

## 2019-09-21 ENCOUNTER — Telehealth: Payer: Self-pay

## 2019-09-21 ENCOUNTER — Ambulatory Visit (HOSPITAL_COMMUNITY)
Admission: RE | Admit: 2019-09-21 | Discharge: 2019-09-21 | Disposition: A | Discharge status: Home / Self Care | Payer: Medicare Other | Source: Ambulatory Visit | Attending: Cardiovascular Disease | Admitting: Cardiovascular Disease

## 2019-09-21 DIAGNOSIS — R7989 Other specified abnormal findings of blood chemistry: Secondary | ICD-10-CM | POA: Insufficient documentation

## 2019-09-21 DIAGNOSIS — R0602 Shortness of breath: Secondary | ICD-10-CM

## 2019-09-21 NOTE — H&P (View-Only) (Signed)
CARDIOLOGY CONSULT NOTE       Patient ID: Jason Summers MRN: [id number] DOB/AGE: 10/07/1931 87 y.o.  Admit date: (Not on file) Referring Physician: Ramaswamy Primary Physician: Jason Summers, Cory, NP Primary Cardiologist: New Reason for Consultation: Dyspnea  Active Problems:   * No active hospital problems. *   HPI:  87 y.o. initially seen at the request of  Dr Jason Summers for dyspnea.  He is originally from [place].  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  I know his son [name] well In 2017 had normal echo History of lung cancer stage 3 non small cell post XRT and chemo Subsequent CT with ILD and IPF Wears oxygen at night   Echo 06/22/18 with EF 55-60% only grade one diastolic dysfunction normal for age mild MR  Bone scan 02/03/19 with left hip uptake   Concern that dyspnea dysproportionate to lung disease although this is hard to imagine given his cancer, radiation and ILD requiring oxygen   No history of CAD:  No chest pain but has not had stress testing or cath Has had increasing dyspnea last few months  Seen by pulmonary 09/19/19 and had sats 88% after ambulation Dr Jason Summers wanted to pursue right heart cath and if he has significant pulmonary HTN start him on Tyvaso which apparently has been approved for his situation now.   Arranged cath for Monday 10/03/19 with CM> Orders written labs done also will need COVID testing     ROS All other systems reviewed and negative except as noted above  Past Medical History:  Diagnosis Date  . Acute rhinitis 08/10/2017  . Acute stress disorder 10/27/2007   Qualifier: Diagnosis of  By: Jenkins MD, John E   Overview:  Overview:  Qualifier: Diagnosis of  By: Jenkins MD, John E  . Anxiety 07/14/2018  . Arthritis   . Bilateral epiphora 09/25/2015  . Blush 09/23/2007   Qualifier: Diagnosis of  By: Jenkins MD, John E   Overview:  Overview:  Qualifier: Diagnosis of  By: Jenkins MD, John E  . Cancer (HCC)    basal cell on right temple  . Chest pain in adult 12/16/2016  . Chronic right-sided thoracic back pain 12/16/2016  . Compression fracture of body of thoracic vertebra (HCC) 02/09/2019  . Dysphagia 07/17/2010   Qualifier: Diagnosis of  By: Esterwood PA-c, Amy S   . Eustachian tube dysfunction, bilateral 08/10/2017  . Ganglion of joint 04/22/2007   Qualifier: Diagnosis of  By: Jenkins MD, John E   . Gastroesophageal reflux disease 05/15/2016  . Headache 01/30/2016  . Hemorrhoids   . HEMORRHOIDS, INTERNAL 02/18/2007   Qualifier: Diagnosis of  By: Jenkins MD, John E   . IPF (idiopathic pulmonary fibrosis) (HCC) 07/14/2018   06/22/2018-CT chest with contrast- spectrum of findings suggestive of basilar predominant fibrotic interstitial lung disease with mild honeycombing asymmetrically involving the right lung with significant progression since February/2019 in September/2019 chest CT.  Findings consistent with UIP.  06/22/2018-echocardiogram- LV ejection fraction 55 to 60%, grade 1 diastolic dysfunction, PA P pressure 4  . Lateral epicondylitis (tennis elbow)   . Lingular mass 09/07/2015  . Mass of lingula of lung 09/07/2015  . Mass of lower lobe of left lung 09/07/2015  . Mediastinal adenopathy 09/07/2015  . Mediastinal lymphadenopathy 08/27/2015  . Multiple pulmonary nodules 08/27/2015  . Nausea and vomiting 05/15/2016  . Non-small cell cancer of left lung (HCC) 09/24/2015   09/13/2015: Left lower lobe lung   biopsy: Overall, the histologic features, in conjunction with the positive staining for cytokeratin 903, cytokeratin 5/6, and p63 support a diagnosis of poorly differentiated squamous cell carcinoma. There is likely sufficient tumor remaining for additional studies, if requested. (JBK:ds 09/17/15)     . Non-small cell lung cancer (NSCLC) ([place]) dx'f 07/30/15  . Nuclear sclerosis of right eye 09/25/2015  . Odynophagia 11/06/2015  . Osteoarthritis 07/17/2010   Qualifier: Diagnosis of  By: Trellis Paganini PA-c, Amy S   .  Persistent headaches 02/03/2017  . Pleural effusion on left 08/14/2016  . Pseudophakia, left eye 09/25/2015  . Pulmonary emphysema ([place]) 08/14/2016  . Radiation fibrosis of lung ([place]) 08/14/2016  . Shortness of breath 08/14/2016  . Soft tissue lesion of shoulder region 04/22/2007   Qualifier: Diagnosis of  By: Arnoldo Morale MD, Balinda Quails   Overview:  Overview:  Qualifier: Diagnosis of  By: Arnoldo Morale MD, Balinda Quails  . Spondylosis of cervical joint   . SPRAIN AND STRAIN OF SACROILIAC 09/06/2008   Qualifier: Diagnosis of  By: Arnoldo Morale MD, Balinda Quails     Family History  Problem Relation Age of Onset  . Hypertension Mother   . Heart disease Mother   . Coronary artery disease Other     Social History   Socioeconomic History  . Marital status: Married    Spouse name: Not on file  . Number of children: Not on file  . Years of education: Not on file  . Highest education level: Not on file  Occupational History  . Occupation: Retired     Comment: Games developer  Tobacco Use  . Smoking status: Former Smoker    Packs/day: 0.20    Years: 62.00    Pack years: 12.40    Types: Cigarettes    Quit date: 03/10/2011    Years since quitting: 8.5  . Smokeless tobacco: Never Used  Substance and Sexual Activity  . Alcohol use: Yes    Alcohol/week: 7.0 standard drinks    Types: 7 Glasses of wine per week    Comment: 1-2 glasses of wine a day  . Drug use: No  . Sexual activity: Yes  Other Topics Concern  . Not on file  Social History Narrative  . Not on file   Social Determinants of Health   Financial Resource Strain:   . Difficulty of Paying Living Expenses:   Food Insecurity:   . Worried About Charity fundraiser in the Last Year:   . Arboriculturist in the Last Year:   Transportation Needs:   . Film/video editor (Medical):   Marland Kitchen Lack of Transportation (Non-Medical):   Physical Activity:   . Days of Exercise per Week:   . Minutes of Exercise per Session:   Stress:   . Feeling of Stress :   Social Connections:    . Frequency of Communication with Friends and Family:   . Frequency of Social Gatherings with Friends and Family:   . Attends Religious Services:   . Active Member of Clubs or Organizations:   . Attends Archivist Meetings:   Marland Kitchen Marital Status:   Intimate Partner Violence:   . Fear of Current or Ex-Partner:   . Emotionally Abused:   Marland Kitchen Physically Abused:   . Sexually Abused:     Past Surgical History:  Procedure Laterality Date  . COLONOSCOPY  2003  . KNEE ARTHROSCOPY  2006   right        Physical Exam: Blood pressure 122/82, pulse 94,  height 5' 6" (1.676 m), weight 175 lb (79.4 kg), SpO2 97 %.    Affect appropriate Chronically ill male  HEENT: normal Neck supple with no adenopathy JVP normal no bruits no thyromegaly Lungs ILD velcro crackles right base especially  Heart:  S1/S2 no murmur, no rub, gallop or click PMI normal Abdomen: benighn, BS positve, no tenderness, no AAA no bruit.  No HSM or HJR Distal pulses intact with no bruits No edema Neuro non-focal Skin warm and dry No muscular weakness   Labs:   Lab Results  Component Value Date   WBC 11.0 (H) 09/19/2019   HGB 14.8 09/19/2019   HCT 43.8 09/19/2019   MCV 90.0 09/19/2019   PLT 242.0 09/19/2019    Recent Labs  Lab 09/19/19 1105  NA 138  K 4.5  CL 100  CO2 31  BUN 24*  CREATININE 1.06  CALCIUM 9.3  PROT 7.1  BILITOT 0.5  ALKPHOS 70  ALT 16  AST 14  GLUCOSE 101*   No results found for: CKTOTAL, CKMB, CKMBINDEX, TROPONINI  Lab Results  Component Value Date   CHOL 204 (H) 08/17/2019   CHOL 224 (H) 05/18/2019   CHOL 161 03/23/2018   Lab Results  Component Value Date   HDL 63.70 08/17/2019   HDL 67.50 05/18/2019   HDL 48.60 03/23/2018   Lab Results  Component Value Date   LDLCALC 122 (H) 08/17/2019   LDLCALC 135 (H) 05/18/2019   LDLCALC 100 (H) 03/23/2018   Lab Results  Component Value Date   TRIG 93.0 08/17/2019   TRIG 108.0 05/18/2019   TRIG 62.0 03/23/2018    Lab Results  Component Value Date   CHOLHDL 3 08/17/2019   CHOLHDL 3 05/18/2019   CHOLHDL 3 03/23/2018   Lab Results  Component Value Date   LDLDIRECT 118.5 08/21/2011   LDLDIRECT 129.2 05/07/2010   LDLDIRECT 147.9 02/18/2007      Radiology: DG Chest 2 View  Result Date: 09/19/2019 CLINICAL DATA:  Known pulmonary fibrosis. Shortness of breath. History of lung carcinoma EXAM: CHEST - 2 VIEW COMPARISON:  December 03, 2018 chest radiograph; PET-CT July 12, 2019; chest CT January 13, 2019 FINDINGS: There is extensive underlying fibrosis throughout the lungs bilaterally. Areas of ill-defined airspace opacity are noted in the left upper lobe as well as in each lower lung region. No new opacity evident. Heart size is normal. There is distortion of pulmonary vascularity bilaterally due to the fibrosis. No adenopathy is appreciable by radiography. There remains anterior wedging of the T6 vertebral body, stable. IMPRESSION: Underlying pulmonary fibrotic type change. Areas of superimposed airspace opacity bilaterally, stable, with probable post radiation therapy change in the left upper lobe region. No new opacity evident. Stable cardiac silhouette. Stable wedging of the T6 vertebral body. Electronically Signed   By: William  Woodruff III M.D.   On: 09/19/2019 10:28   CT Angio Chest W/Cm &/Or Wo Cm  Result Date: 09/20/2019 CLINICAL DATA:  Shortness of breath. Elevated D-dimer. History of lung carcinoma EXAM: CT ANGIOGRAPHY CHEST WITH CONTRAST TECHNIQUE: Multidetector CT imaging of the chest was performed using the standard protocol during bolus administration of intravenous contrast. Multiplanar CT image reconstructions and MIPs were obtained to evaluate the vascular anatomy. CONTRAST:  80mL OMNIPAQUE IOHEXOL 350 MG/ML SOLN COMPARISON:  Chest CT January 13, 2019 chest radiograph September 19, 2018; PET-CT July 12, 2019 FINDINGS: Cardiovascular: No demonstrable pulmonary embolus. No thoracic aortic aneurysm or  dissection. Visualized great vessels appear essentially normal. There are   foci of aortic atherosclerosis. There are foci of coronary artery calcification. There is a small pericardial effusion which may be within physiologic range. There is no pericardial thickening. Mediastinum/Nodes: Thyroid appears unremarkable. There is a stable lymph node immediately anterior to the carina measuring 1.5 x 1.3 cm. Several subcentimeter lymph nodes elsewhere are stable. There is a focal hiatal hernia. Lungs/Pleura: Underlying emphysematous change. There is a left-sided hydropneumothorax with air within the fluid portion of the hydropneumothorax, similar to recent PET study. The size of effusion is stable compared to the most recent PET study. There is extensive underlying emphysematous change with multiple areas of fibrosis scattered throughout the lungs, most severe in the right middle and lower lobes but also present in the periphery of each upper lobe, somewhat more severe on the left than on the right. There is cylindrical bronchiectatic change in the medial left upper lobe, stable. Multiple areas of scarring or essentially stable. There is airspace consolidation, chronic, in the left lower lobe region. Upper Abdomen: There is a cyst in the left lobe of the liver measuring 4.0 x 3.0 cm. There is a second smaller cyst in the right lobe of the liver anteriorly near the dome measuring 1.7 x 1.4 cm. There is upper abdominal aortic atherosclerosis. Visualized upper abdominal structures otherwise appear unremarkable. Musculoskeletal: There is a stable wedge compression fracture of the T6 vertebral body. No new bone lesions evident. No chest wall lesions. Review of the MIP images confirms the above findings. IMPRESSION: 1. No demonstrable pulmonary embolus. No thoracic aortic aneurysm. There is aortic atherosclerosis as well as foci of coronary artery calcification. 2. Underlying emphysematous change. Stable hydropneumothorax on the  left. Underlying areas of fibrosis and bronchiectatic change. Airspace opacity in the left lower lobe is stable and likely represents chronic scarring and atelectatic change. No new mass or infiltrative appearing lesion in the lungs evident. 3.  Stable enlarged pericarinal lymph node.  No new adenopathy. 4.  Hiatal hernia. 5. Stable wedge compression fracture at T6. Neoplastic etiology questioned. Aortic Atherosclerosis (ICD10-I70.0) and Emphysema (ICD10-J43.9). Electronically Signed   By: Lowella Grip III M.D.   On: 09/20/2019 13:57   VAS [place] LOWER EXTREMITY VENOUS (DVT)  Result Date: 09/22/2019  Lower Venous DVTStudy Indications: Elevated D-dimer with increasing SOB x 6 weeks. Patient denies leg swelling or chest pain.  Comparison Study: None Performing Technologist: Alecia Mackin RVT, RDCS (AE), RDMS  Examination Guidelines: A complete evaluation includes B-mode imaging, spectral Doppler, color Doppler, and power Doppler as needed of all accessible portions of each vessel. Bilateral testing is considered an integral part of a complete examination. Limited examinations for reoccurring indications may be performed as noted. The reflux portion of the exam is performed with the patient in reverse Trendelenburg.  +---------+---------------+---------+-----------+----------+--------------+ RIGHT    CompressibilityPhasicitySpontaneityPropertiesThrombus Aging +---------+---------------+---------+-----------+----------+--------------+ CFV      Full           Yes      Yes                                 +---------+---------------+---------+-----------+----------+--------------+ SFJ      Full           Yes      Yes                                 +---------+---------------+---------+-----------+----------+--------------+ FV Prox  Full  Yes      Yes                                 +---------+---------------+---------+-----------+----------+--------------+ FV Mid   Full            Yes      Yes                                 +---------+---------------+---------+-----------+----------+--------------+ FV DistalFull           Yes      Yes                                 +---------+---------------+---------+-----------+----------+--------------+ PFV      Full                                                        +---------+---------------+---------+-----------+----------+--------------+ POP      Full           Yes      Yes                                 +---------+---------------+---------+-----------+----------+--------------+ PTV      Full           Yes      Yes                                 +---------+---------------+---------+-----------+----------+--------------+ PERO     Full           Yes      Yes                                 +---------+---------------+---------+-----------+----------+--------------+ Gastroc  Full                                                        +---------+---------------+---------+-----------+----------+--------------+ GSV      Full           Yes      Yes                                 +---------+---------------+---------+-----------+----------+--------------+   +---------+---------------+---------+-----------+----------+--------------+ LEFT     CompressibilityPhasicitySpontaneityPropertiesThrombus Aging +---------+---------------+---------+-----------+----------+--------------+ CFV      Full           Yes      Yes                                 +---------+---------------+---------+-----------+----------+--------------+ SFJ      Full           Yes      Yes                                 +---------+---------------+---------+-----------+----------+--------------+  FV Prox  Full           Yes      Yes                                 +---------+---------------+---------+-----------+----------+--------------+ FV Mid   Full           Yes      Yes                                  +---------+---------------+---------+-----------+----------+--------------+ FV DistalFull           Yes      Yes                                 +---------+---------------+---------+-----------+----------+--------------+ PFV      Full                                                        +---------+---------------+---------+-----------+----------+--------------+ POP      Full           Yes      Yes                                 +---------+---------------+---------+-----------+----------+--------------+ PTV      Full           Yes      Yes                                 +---------+---------------+---------+-----------+----------+--------------+ PERO     Full           Yes      Yes                                 +---------+---------------+---------+-----------+----------+--------------+ Gastroc  Full                                                        +---------+---------------+---------+-----------+----------+--------------+ GSV      Full           Yes      Yes                                 +---------+---------------+---------+-----------+----------+--------------+    Findings reported to Wyn Quaker, NP email through [facility] at 9:15 am.  Summary: RIGHT: - No evidence of deep vein thrombosis in the lower extremity. No indirect evidence of obstruction proximal to the inguinal ligament. - No cystic structure found in the popliteal fossa.  LEFT: - No evidence of deep vein thrombosis in the lower extremity. No indirect evidence of obstruction proximal to the inguinal ligament. - No cystic structure found in the popliteal fossa.  *See table(s) above for measurements and observations. Electronically  signed by Kathlyn Sacramento MD on 09/22/2019 at 6:36:12 AM.    Final     EKG: 2017 SR prominent R V1 PVC 03/25/19 NSR rate 89 normal    ASSESSMENT AND PLAN:   1. Dyspnea;  Related to previous lung cancer and Rx with resultant ILD. CT 09/20/19 with no PE. TTE 06/22/18  with EF 55-60%  Mild MR/TR estimated PA systolic pressure only 45 mmHg. Worsening exertional dyspnea BNP was normal 09/19/19  ? Drug trial with  Tyvaso requires right heart cath Given age and lack of previous stress testing discussed performing both left and right heart cath. Risks including MI, stroke, intubation, bleeding and need for emergency surgery discussed Willing to proceed   2. Lung Cancer post chemo XRT with ILD Using oxygen, proair, Ellipta and prednisone ? Trial of Tyvaso pending results of right and left heart cath   3. HLD:  On statin LDL 122 08/17/19     Signed: Jenkins Rouge 09/26/2019, 9:48 AM

## 2019-09-21 NOTE — Progress Notes (Signed)
CARDIOLOGY CONSULT NOTE       Patient ID: Jason Summers MRN: [id number] DOB/AGE: [id number] y.o.  Admit date: (Not on file) Referring Physician: Chase Caller Primary Physician: [name], NP Primary Cardiologist: New Reason for Consultation: Dyspnea  Active Problems:   * No active hospital problems. *   HPI:  86 y.o. initially seen at the request of  Dr Chase Caller for dyspnea.  He is originally from [place].  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  I know his son Sheran Fava well In 2017 had normal echo History of lung cancer stage 3 non small cell post XRT and chemo Subsequent CT with ILD and IPF Wears oxygen at night   Echo 06/22/18 with EF 55-60% only grade one diastolic dysfunction normal for age mild MR  Bone scan 02/03/19 with left hip uptake   Concern that dyspnea dysproportionate to lung disease although this is hard to imagine given his cancer, radiation and ILD requiring oxygen   No history of CAD:  No chest pain but has not had stress testing or cath Has had increasing dyspnea last few months  Seen by pulmonary 09/19/19 and had sats 88% after ambulation Dr Chase Caller wanted to pursue right heart cath and if he has significant pulmonary HTN start him on Tyvaso which apparently has been approved for his situation now.   Arranged cath for Monday 10/03/19 with CM> Orders written labs done also will need COVID testing     ROS All other systems reviewed and negative except as noted above  Past Medical History:  Diagnosis Date  . Acute rhinitis 08/10/2017  . Acute stress disorder 10/27/2007   Qualifier: Diagnosis of  By: Arnoldo Morale MD, Balinda Quails   Overview:  Overview:  Qualifier: Diagnosis of  By: Arnoldo Morale MD, Balinda Quails  . Anxiety 07/14/2018  . Arthritis   . Bilateral epiphora 09/25/2015  . Blush 09/23/2007   Qualifier: Diagnosis of  By: Arnoldo Morale MD, Balinda Quails   Overview:  Overview:  Qualifier: Diagnosis of  By: Arnoldo Morale MD, [facility])    basal cell on right temple  . Chest pain in adult 12/16/2016  . Chronic right-sided thoracic back pain 12/16/2016  . Compression fracture of body of thoracic vertebra ([place]) 02/09/2019  . Dysphagia 07/17/2010   Qualifier: Diagnosis of  By: Trellis Paganini PA-c, Amy S   . Eustachian tube dysfunction, bilateral 08/10/2017  . Ganglion of joint 04/22/2007   Qualifier: Diagnosis of  By: Arnoldo Morale MD, Balinda Quails   . Gastroesophageal reflux disease 05/15/2016  . Headache 01/30/2016  . Hemorrhoids   . HEMORRHOIDS, INTERNAL 02/18/2007   Qualifier: Diagnosis of  By: Arnoldo Morale MD, Balinda Quails   . IPF (idiopathic pulmonary fibrosis) ([place]) 07/14/2018   06/22/2018-CT chest with contrast- spectrum of findings suggestive of basilar predominant fibrotic interstitial lung disease with mild honeycombing asymmetrically involving the right lung with significant progression since February/2019 in September/2019 chest CT.  Findings consistent with UIP.  06/22/2018-echocardiogram- LV ejection fraction 55 to 25%, grade 1 diastolic dysfunction, PA P pressure 4  . Lateral epicondylitis (tennis elbow)   . Lingular mass 09/07/2015  . Mass of lingula of lung 09/07/2015  . Mass of lower lobe of left lung 09/07/2015  . Mediastinal adenopathy 09/07/2015  . Mediastinal lymphadenopathy 08/27/2015  . Multiple pulmonary nodules 08/27/2015  . Nausea and vomiting 05/15/2016  . Non-small cell cancer of left lung [facility]) 09/24/2015   09/13/2015: Left lower lobe lung  biopsy: Overall, the histologic features, in conjunction with the positive staining for cytokeratin 903, cytokeratin 5/6, and p63 support a diagnosis of poorly differentiated squamous cell carcinoma. There is likely sufficient tumor remaining for additional studies, if requested. (JBK:ds 09/17/15)     . Non-small cell lung cancer (NSCLC) ([place]) dx'f 07/30/15  . Nuclear sclerosis of right eye 09/25/2015  . Odynophagia 11/06/2015  . Osteoarthritis 07/17/2010   Qualifier: Diagnosis of  By: Trellis Paganini PA-c, Amy S   .  Persistent headaches 02/03/2017  . Pleural effusion on left 08/14/2016  . Pseudophakia, left eye 09/25/2015  . Pulmonary emphysema ([place]) 08/14/2016  . Radiation fibrosis of lung ([place]) 08/14/2016  . Shortness of breath 08/14/2016  . Soft tissue lesion of shoulder region 04/22/2007   Qualifier: Diagnosis of  By: Arnoldo Morale MD, Balinda Quails   Overview:  Overview:  Qualifier: Diagnosis of  By: Arnoldo Morale MD, Balinda Quails  . Spondylosis of cervical joint   . SPRAIN AND STRAIN OF SACROILIAC 09/06/2008   Qualifier: Diagnosis of  By: Arnoldo Morale MD, Balinda Quails     Family History  Problem Relation Age of Onset  . Hypertension Mother   . Heart disease Mother   . Coronary artery disease Other     Social History   Socioeconomic History  . Marital status: Married    Spouse name: Not on file  . Number of children: Not on file  . Years of education: Not on file  . Highest education level: Not on file  Occupational History  . Occupation: Retired     Comment: Games developer  Tobacco Use  . Smoking status: Former Smoker    Packs/day: 0.20    Years: 62.00    Pack years: 12.40    Types: Cigarettes    Quit date: 03/10/2011    Years since quitting: 8.5  . Smokeless tobacco: Never Used  Substance and Sexual Activity  . Alcohol use: Yes    Alcohol/week: 7.0 standard drinks    Types: 7 Glasses of wine per week    Comment: 1-2 glasses of wine a day  . Drug use: No  . Sexual activity: Yes  Other Topics Concern  . Not on file  Social History Narrative  . Not on file   Social Determinants of Health   Financial Resource Strain:   . Difficulty of Paying Living Expenses:   Food Insecurity:   . Worried About Charity fundraiser in the Last Year:   . Arboriculturist in the Last Year:   Transportation Needs:   . Film/video editor (Medical):   Marland Kitchen Lack of Transportation (Non-Medical):   Physical Activity:   . Days of Exercise per Week:   . Minutes of Exercise per Session:   Stress:   . Feeling of Stress :   Social Connections:    . Frequency of Communication with Friends and Family:   . Frequency of Social Gatherings with Friends and Family:   . Attends Religious Services:   . Active Member of Clubs or Organizations:   . Attends Archivist Meetings:   Marland Kitchen Marital Status:   Intimate Partner Violence:   . Fear of Current or Ex-Partner:   . Emotionally Abused:   Marland Kitchen Physically Abused:   . Sexually Abused:     Past Surgical History:  Procedure Laterality Date  . COLONOSCOPY  2003  . KNEE ARTHROSCOPY  2006   right        Physical Exam: Blood pressure 122/82, pulse 94,  height _0  (1.676 m), weight 175 lb (79.4 kg), SpO2 97 %.    Affect appropriate Chronically ill male  HEENT: normal Neck supple with no adenopathy JVP normal no bruits no thyromegaly Lungs ILD velcro crackles right base especially  Heart:  S1/S2 no murmur, no rub, gallop or click PMI normal Abdomen: benighn, BS positve, no tenderness, no AAA no bruit.  No HSM or HJR Distal pulses intact with no bruits No edema Neuro non-focal Skin warm and dry No muscular weakness   Labs:   Lab Results  Component Value Date   WBC 11.0 (H) 09/19/2019   HGB 14.8 09/19/2019   HCT 43.8 09/19/2019   MCV 90.0 09/19/2019   PLT 242.0 09/19/2019    Recent Labs  Lab 09/19/19 1105  NA 138  K 4.5  CL 100  CO2 31  BUN 24*  CREATININE 1.06  CALCIUM 9.3  PROT 7.1  BILITOT 0.5  ALKPHOS 70  ALT 16  AST 14  GLUCOSE 101*   No results found for: CKTOTAL, CKMB, CKMBINDEX, TROPONINI  Lab Results  Component Value Date   CHOL 204 (H) 08/17/2019   CHOL 224 (H) 05/18/2019   CHOL 161 03/23/2018   Lab Results  Component Value Date   HDL 63.70 08/17/2019   HDL 67.50 05/18/2019   HDL 48.60 03/23/2018   Lab Results  Component Value Date   LDLCALC 122 (H) 08/17/2019   LDLCALC 135 (H) 05/18/2019   LDLCALC 100 (H) 03/23/2018   Lab Results  Component Value Date   TRIG 93.0 08/17/2019   TRIG 108.0 05/18/2019   TRIG 62.0 03/23/2018    Lab Results  Component Value Date   CHOLHDL 3 08/17/2019   CHOLHDL 3 05/18/2019   CHOLHDL 3 03/23/2018   Lab Results  Component Value Date   LDLDIRECT 118.5 08/21/2011   LDLDIRECT 129.2 05/07/2010   LDLDIRECT 147.9 02/18/2007      Radiology: DG Chest 2 View  Result Date: 09/19/2019 CLINICAL DATA:  Known pulmonary fibrosis. Shortness of breath. History of lung carcinoma EXAM: CHEST - 2 VIEW COMPARISON:  December 03, 2018 chest radiograph; PET-CT July 12, 2019; chest CT January 13, 2019 FINDINGS: There is extensive underlying fibrosis throughout the lungs bilaterally. Areas of ill-defined airspace opacity are noted in the left upper lobe as well as in each lower lung region. No new opacity evident. Heart size is normal. There is distortion of pulmonary vascularity bilaterally due to the fibrosis. No adenopathy is appreciable by radiography. There remains anterior wedging of the T6 vertebral body, stable. IMPRESSION: Underlying pulmonary fibrotic type change. Areas of superimposed airspace opacity bilaterally, stable, with probable post radiation therapy change in the left upper lobe region. No new opacity evident. Stable cardiac silhouette. Stable wedging of the T6 vertebral body. Electronically Signed   By: Lowella Grip III M.D.   On: 09/19/2019 10:28   CT Angio Chest W/Cm &/Or Wo Cm  Result Date: 09/20/2019 CLINICAL DATA:  Shortness of breath. Elevated D-dimer. History of lung carcinoma EXAM: CT ANGIOGRAPHY CHEST WITH CONTRAST TECHNIQUE: Multidetector CT imaging of the chest was performed using the standard protocol during bolus administration of intravenous contrast. Multiplanar CT image reconstructions and MIPs were obtained to evaluate the vascular anatomy. CONTRAST:  54m OMNIPAQUE IOHEXOL 350 MG/ML SOLN COMPARISON:  Chest CT January 13, 2019 chest radiograph September 19, 2018; PET-CT July 12, 2019 FINDINGS: Cardiovascular: No demonstrable pulmonary embolus. No thoracic aortic aneurysm or  dissection. Visualized great vessels appear essentially normal. There are  foci of aortic atherosclerosis. There are foci of coronary artery calcification. There is a small pericardial effusion which may be within physiologic range. There is no pericardial thickening. Mediastinum/Nodes: Thyroid appears unremarkable. There is a stable lymph node immediately anterior to the carina measuring 1.5 x 1.3 cm. Several subcentimeter lymph nodes elsewhere are stable. There is a focal hiatal hernia. Lungs/Pleura: Underlying emphysematous change. There is a left-sided hydropneumothorax with air within the fluid portion of the hydropneumothorax, similar to recent PET study. The size of effusion is stable compared to the most recent PET study. There is extensive underlying emphysematous change with multiple areas of fibrosis scattered throughout the lungs, most severe in the right middle and lower lobes but also present in the periphery of each upper lobe, somewhat more severe on the left than on the right. There is cylindrical bronchiectatic change in the medial left upper lobe, stable. Multiple areas of scarring or essentially stable. There is airspace consolidation, chronic, in the left lower lobe region. Upper Abdomen: There is a cyst in the left lobe of the liver measuring 4.0 x 3.0 cm. There is a second smaller cyst in the right lobe of the liver anteriorly near the dome measuring 1.7 x 1.4 cm. There is upper abdominal aortic atherosclerosis. Visualized upper abdominal structures otherwise appear unremarkable. Musculoskeletal: There is a stable wedge compression fracture of the T6 vertebral body. No new bone lesions evident. No chest wall lesions. Review of the MIP images confirms the above findings. IMPRESSION: 1. No demonstrable pulmonary embolus. No thoracic aortic aneurysm. There is aortic atherosclerosis as well as foci of coronary artery calcification. 2. Underlying emphysematous change. Stable hydropneumothorax on the  left. Underlying areas of fibrosis and bronchiectatic change. Airspace opacity in the left lower lobe is stable and likely represents chronic scarring and atelectatic change. No new mass or infiltrative appearing lesion in the lungs evident. 3.  Stable enlarged pericarinal lymph node.  No new adenopathy. 4.  Hiatal hernia. 5. Stable wedge compression fracture at T6. Neoplastic etiology questioned. Aortic Atherosclerosis (ICD10-I70.0) and Emphysema (ICD10-J43.9). Electronically Signed   By: Lowella Grip III M.D.   On: 09/20/2019 13:57   VAS [place] LOWER EXTREMITY VENOUS (DVT)  Result Date: 09/22/2019  Lower Venous DVTStudy Indications: Elevated D-dimer with increasing SOB x 6 weeks. Patient denies leg swelling or chest pain.  Comparison Study: None Performing Technologist: Alecia Mackin RVT, RDCS (AE), RDMS  Examination Guidelines: A complete evaluation includes B-mode imaging, spectral Doppler, color Doppler, and power Doppler as needed of all accessible portions of each vessel. Bilateral testing is considered an integral part of a complete examination. Limited examinations for reoccurring indications may be performed as noted. The reflux portion of the exam is performed with the patient in reverse Trendelenburg.  +---------+---------------+---------+-----------+----------+--------------+ RIGHT    CompressibilityPhasicitySpontaneityPropertiesThrombus Aging +---------+---------------+---------+-----------+----------+--------------+ CFV      Full           Yes      Yes                                 +---------+---------------+---------+-----------+----------+--------------+ SFJ      Full           Yes      Yes                                 +---------+---------------+---------+-----------+----------+--------------+ FV Prox  Full  Yes      Yes                                 +---------+---------------+---------+-----------+----------+--------------+ FV Mid   Full            Yes      Yes                                 +---------+---------------+---------+-----------+----------+--------------+ FV DistalFull           Yes      Yes                                 +---------+---------------+---------+-----------+----------+--------------+ PFV      Full                                                        +---------+---------------+---------+-----------+----------+--------------+ POP      Full           Yes      Yes                                 +---------+---------------+---------+-----------+----------+--------------+ PTV      Full           Yes      Yes                                 +---------+---------------+---------+-----------+----------+--------------+ PERO     Full           Yes      Yes                                 +---------+---------------+---------+-----------+----------+--------------+ Gastroc  Full                                                        +---------+---------------+---------+-----------+----------+--------------+ GSV      Full           Yes      Yes                                 +---------+---------------+---------+-----------+----------+--------------+   +---------+---------------+---------+-----------+----------+--------------+ LEFT     CompressibilityPhasicitySpontaneityPropertiesThrombus Aging +---------+---------------+---------+-----------+----------+--------------+ CFV      Full           Yes      Yes                                 +---------+---------------+---------+-----------+----------+--------------+ SFJ      Full           Yes      Yes                                 +---------+---------------+---------+-----------+----------+--------------+  FV Prox  Full           Yes      Yes                                 +---------+---------------+---------+-----------+----------+--------------+ FV Mid   Full           Yes      Yes                                  +---------+---------------+---------+-----------+----------+--------------+ FV DistalFull           Yes      Yes                                 +---------+---------------+---------+-----------+----------+--------------+ PFV      Full                                                        +---------+---------------+---------+-----------+----------+--------------+ POP      Full           Yes      Yes                                 +---------+---------------+---------+-----------+----------+--------------+ PTV      Full           Yes      Yes                                 +---------+---------------+---------+-----------+----------+--------------+ PERO     Full           Yes      Yes                                 +---------+---------------+---------+-----------+----------+--------------+ Gastroc  Full                                                        +---------+---------------+---------+-----------+----------+--------------+ GSV      Full           Yes      Yes                                 +---------+---------------+---------+-----------+----------+--------------+    Findings reported to Wyn Quaker, NP email through [facility] at 9:15 am.  Summary: RIGHT: - No evidence of deep vein thrombosis in the lower extremity. No indirect evidence of obstruction proximal to the inguinal ligament. - No cystic structure found in the popliteal fossa.  LEFT: - No evidence of deep vein thrombosis in the lower extremity. No indirect evidence of obstruction proximal to the inguinal ligament. - No cystic structure found in the popliteal fossa.  *See table(s) above for measurements and observations. Electronically  signed by Kathlyn Sacramento MD on 09/22/2019 at 6:36:12 AM.    Final     EKG: 2017 SR prominent R V1 PVC 03/25/19 NSR rate 89 normal    ASSESSMENT AND PLAN:   1. Dyspnea;  Related to previous lung cancer and Rx with resultant ILD. CT 09/20/19 with no PE. TTE 06/22/18  with EF 55-60%  Mild MR/TR estimated PA systolic pressure only 45 mmHg. Worsening exertional dyspnea BNP was normal 09/19/19  ? Drug trial with  Tyvaso requires right heart cath Given age and lack of previous stress testing discussed performing both left and right heart cath. Risks including MI, stroke, intubation, bleeding and need for emergency surgery discussed Willing to proceed   2. Lung Cancer post chemo XRT with ILD Using oxygen, proair, Ellipta and prednisone ? Trial of Tyvaso pending results of right and left heart cath   3. HLD:  On statin LDL 122 08/17/19     Signed: Jenkins Rouge 09/26/2019, 9:48 AM

## 2019-09-21 NOTE — Telephone Encounter (Signed)
Per Dr. Johnsie Cancel, have him see me or PA/NP this week / next to arrange right heart cath  Left message for patient to call back. Placed patient on schedule for 09/26/19. Will make sure patient can come on that day, or will need to reschedule.

## 2019-09-22 NOTE — Progress Notes (Signed)
Patient identification verified. Results of recent lower extremity doppler studies provided. Per Wyn Quaker FNP, both studies were negative for DVT. Patient's wife cares for [facility] and verbalized understanding of results.

## 2019-09-22 NOTE — Telephone Encounter (Signed)
Patient's wife called - asked to speak with Pam.

## 2019-09-22 NOTE — Telephone Encounter (Signed)
Called patient's wife back to let her know patient has an appointment with Dr. Johnsie Cancel on Monday, instead of virtual visit on 10/04/19. Patient's wife stated patient will be there on Monday.

## 2019-09-24 ENCOUNTER — Other Ambulatory Visit (HOSPITAL_COMMUNITY): Payer: Medicare Other

## 2019-09-26 ENCOUNTER — Ambulatory Visit (INDEPENDENT_AMBULATORY_CARE_PROVIDER_SITE_OTHER): Payer: Medicare Other | Admitting: Cardiovascular Disease

## 2019-09-26 ENCOUNTER — Encounter: Payer: Self-pay | Admitting: Cardiovascular Disease

## 2019-09-26 ENCOUNTER — Other Ambulatory Visit: Payer: Self-pay

## 2019-09-26 ENCOUNTER — Other Ambulatory Visit: Payer: Self-pay | Admitting: Cardiovascular Disease

## 2019-09-26 VITALS — BP 122/82 | HR 94 | Ht 66.0 in | Wt 175.0 lb

## 2019-09-26 DIAGNOSIS — R0600 Dyspnea, unspecified: Secondary | ICD-10-CM

## 2019-09-26 DIAGNOSIS — R0609 Other forms of dyspnea: Secondary | ICD-10-CM

## 2019-09-26 DIAGNOSIS — I272 Pulmonary hypertension, unspecified: Secondary | ICD-10-CM

## 2019-09-26 MED ORDER — SODIUM CHLORIDE 0.9% FLUSH: 3.0000 mL | Freq: Two times a day (BID) | INTRAVENOUS | Status: DC

## 2019-09-26 NOTE — Patient Instructions (Signed)
Medication Instructions:  *If you need a refill on your cardiac medications before your next appointment, please call your pharmacy*  Lab Work: Your physician recommends that you return for lab work on Friday 23rd for BMET and CBC.  If you have labs (blood work) drawn today and your tests are completely normal, you will receive your results only by: Marland Kitchen MyChart Message (if you have MyChart) OR . A paper copy in the mail If you have any lab test that is abnormal or we need to change your treatment, we will call you to review the results.  Testing/Procedures: Your physician has requested that you have a cardiac catheterization. Cardiac catheterization is used to diagnose and/or treat various heart conditions. Doctors may recommend this procedure for a number of different reasons. The most common reason is to evaluate chest pain. Chest pain can be a symptom of coronary artery disease (CAD), and cardiac catheterization can show whether plaque is narrowing or blocking your heart's arteries. This procedure is also used to evaluate the valves, as well as measure the blood flow and oxygen levels in different parts of your heart. For further information please visit [email or web address].[email or web address]. Please follow instruction sheet, as given.  Follow-Up: At [facility], you and your health needs are our priority.  As part of our continuing mission to provide you with exceptional heart care, we have created designated Provider Care Teams.  These Care Teams include your primary Cardiologist (physician) and Advanced Practice Providers (APPs -  Physician Assistants and Nurse Practitioners) who all work together to provide you with the care you need, when you need it.  We recommend signing up for the patient portal called "MyChart".  Sign up information is provided on this After Visit Summary.  MyChart is used to connect with patients for Virtual Visits (Telemedicine).  Patients are able to view lab/test results, encounter  notes, upcoming appointments, etc.  Non-urgent messages can be sent to your provider as well.   To learn more about what you can do with MyChart, go to [email or web address].    Your next appointment:   2 week(s)  The format for your next appointment:   In Person  Provider:   You may see Jenkins Rouge, MD or one of the following Advanced Practice Providers on your designated Care Team:    Truitt Merle, NP  Cecilie Kicks, NP  Kathyrn Drown, NP       [facility] OFFICE [place], [place] [address] Dept: [phone] Loc: [place]  09/26/2019  You are scheduled for a Cardiac Catheterization on Monday, April 26 with Dr. Lauree Chandler.  1. Please arrive at the [facility] (Main Entrance A) at [facility]: [address] [place], [place] [address] at 5:30 AM (This time is two hours before your procedure to ensure your preparation). Free valet parking service is available.   Special note: Every effort is made to have your procedure done on time. Please understand that emergencies sometimes delay scheduled procedures.  2. Diet: Do not eat solid foods after midnight.  The patient may have clear liquids until 5am upon the day of the procedure.  3. Labs: You will need to have blood drawn on Friday, April 23 at [facility]. [address]  Open: 7:30am - 5pm    Phone: [phone]. You do not need to be fasting.  Your Pre-procedure COVID-19 Testing  will be done on 09/30/19 at [facility] at [address], [place], [place] [address]. Once you arrive at the testing site, stay in the right hand lane, go under the building overhang not the tent. If you are tested under the tent your results may not be back before your procedure. Please be on time for your appointment.  After your swab you  will be given a mask to wear and instructed to go home and quarantine/no visitors until after your procedure. If you test positive you will be notified and your procedure will be cancelled.   4. Medication instructions in preparation for your procedure:   Contrast Allergy: No  On the morning of your procedure, take your Aspirin and any morning medicines NOT listed above.  You may use sips of water.  5. Plan for one night stay--bring personal belongings. 6. Bring a current list of your medications and current insurance cards. 7. You MUST have a responsible person to drive you home. 8. Someone MUST be with you the first 24 hours after you arrive home or your discharge will be delayed. 9. Please wear clothes that are easy to get on and off and wear slip-on shoes.  Thank you for allowing [place] to care for you!   -- [facility]

## 2019-09-28 ENCOUNTER — Ambulatory Visit: Payer: Medicare Other | Admitting: Pulmonary Disease

## 2019-09-30 ENCOUNTER — Telehealth: Payer: Self-pay | Admitting: Cardiovascular Disease

## 2019-09-30 ENCOUNTER — Other Ambulatory Visit (HOSPITAL_COMMUNITY)
Admission: RE | Admit: 2019-09-30 | Discharge: 2019-09-30 | Disposition: A | Discharge status: Home / Self Care | Payer: Medicare Other | Source: Ambulatory Visit | Attending: Cardiovascular Disease | Admitting: Cardiovascular Disease

## 2019-09-30 ENCOUNTER — Other Ambulatory Visit: Payer: Self-pay

## 2019-09-30 ENCOUNTER — Other Ambulatory Visit: Payer: Medicare Other | Admitting: *Deleted

## 2019-09-30 DIAGNOSIS — Z01812 Encounter for preprocedural laboratory examination: Secondary | ICD-10-CM | POA: Insufficient documentation

## 2019-09-30 DIAGNOSIS — Z20822 Contact with and (suspected) exposure to covid-19: Secondary | ICD-10-CM | POA: Diagnosis not present

## 2019-09-30 DIAGNOSIS — R0609 Other forms of dyspnea: Secondary | ICD-10-CM

## 2019-09-30 LAB — CBC WITH DIFFERENTIAL/PLATELET
Basophils Absolute: 0 10*3/uL (ref 0.0–0.2)
Basos: 0 %
EOS (ABSOLUTE): 0 10*3/uL (ref 0.0–0.4)
Eos: 0 %
Hematocrit: 45.4 % (ref 37.5–51.0)
Hemoglobin: 15.2 g/dL (ref 13.0–17.7)
Lymphocytes Absolute: 0.9 10*3/uL (ref 0.7–3.1)
Lymphs: 7 %
MCH: 30 pg (ref 26.6–33.0)
MCHC: 33.5 g/dL (ref 31.5–35.7)
MCV: 90 fL (ref 79–97)
Monocytes Absolute: 0.5 10*3/uL (ref 0.1–0.9)
Monocytes: 4 %
Neutrophils Absolute: 12.5 10*3/uL — ABNORMAL HIGH (ref 1.4–7.0)
Neutrophils: 89 %
Platelets: 181 10*3/uL (ref 150–450)
RBC: 5.06 x10E6/uL (ref 4.14–5.80)
RDW: 14.8 % (ref 11.6–15.4)
WBC: 14 10*3/uL — ABNORMAL HIGH (ref 3.4–10.8)

## 2019-09-30 LAB — BASIC METABOLIC PANEL
BUN/Creatinine Ratio: 23 (ref 10–24)
BUN: 23 mg/dL (ref 8–27)
CO2: 28 mmol/L (ref 20–29)
Calcium: 9.6 mg/dL (ref 8.6–10.2)
Chloride: 101 mmol/L (ref 96–106)
Creatinine, Ser: 0.99 mg/dL (ref 0.76–1.27)
GFR calc Af Amer: 80 mL/min/{1.73_m2} (ref >59)
GFR calc non Af Amer: 69 mL/min/{1.73_m2} (ref >59)
Glucose: 118 mg/dL — ABNORMAL HIGH (ref 65–99)
Potassium: 4.7 mmol/L (ref 3.5–5.2)
Sodium: 135 mmol/L (ref 134–144)

## 2019-09-30 LAB — SARS CORONAVIRUS 2 (TAT 6-24 HRS): SARS Coronavirus 2: NEGATIVE

## 2019-09-30 NOTE — Progress Notes (Deleted)
Cardiology Office Note   Date:  09/30/2019   ID:  Jason Summers, DOB 1931-12-01, MRN [id number]  PCP:  [name], NP  Cardiologist:  Dr. Johnsie Cancel, MD   No chief complaint on file.     History of Present Illness: Jason Summers is a 86 y.o. male who presents for post cath follow-up, seen by Dr. Johnsie Cancel.   Jason Summers has a hx of lung CA stage III status post XRT, chemotherapy with subsequent CT with ILD and IPF, chronic nocturnal supplemental oxygen,  He had an echocardiogram performed in 2017 which was found to be normal.  Most recent echo from 06/22/2018 with LVEF of 55 to 60% with G1 DD and mild MR.  Bone scan from 02/03/2019 with left hip uptake.  He was most recently seen by Dr. Johnsie Cancel after referral from Dr. Chase Caller for dyspnea disproportionate to lung disease.  He has no prior history of CAD and no anginal symptoms.  O2 saturations at pulmonary follow-up were 88% after ambulation with Dr. Chase Caller therefore he was referred to cardiology to pursue RHC to assess for significant pulmonary hypertension and Tyvaso was initiated.   Elective cath scheduled for Monday, 10/03/2019 which showed   1.  DOE:   2.  Chronic lung disease secondary to chemotherapy/RXT: -Followed in the outpatient setting by Dr. Chase Caller, recently referred to cardiology for dyspnea disproportionate to lung disease worrisome for severe pulmonary hypertension -RHC performed 10/03/2019  3.  History of lung CA: Status post chemo/XRT now with interstitial lung disease requiring supplemental O2 -Currently on ProAir, Ellipta, prednisone and trial of Tyvaso -Plan is for right and left cardiac catheterization to fully assess right heart pressures as well as coronary anatomy  4.  HLD: -Last LDL, 122 on 08/17/2019 -Continue statin therapy      Past Medical History:  Diagnosis Date  . Acute rhinitis 08/10/2017  . Acute stress disorder 10/27/2007   Qualifier: Diagnosis of  By: Arnoldo Morale MD, Balinda Quails   Overview:   Overview:  Qualifier: Diagnosis of  By: Arnoldo Morale MD, Balinda Quails  . Anxiety 07/14/2018  . Arthritis   . Bilateral epiphora 09/25/2015  . Blush 09/23/2007   Qualifier: Diagnosis of  By: Arnoldo Morale MD, Balinda Quails   Overview:  Overview:  Qualifier: Diagnosis of  By: Arnoldo Morale MD, [facility])    basal cell on right temple  . Chest pain in adult 12/16/2016  . Chronic right-sided thoracic back pain 12/16/2016  . Compression fracture of body of thoracic vertebra ([place]) 02/09/2019  . Dysphagia 07/17/2010   Qualifier: Diagnosis of  By: Trellis Paganini PA-c, Amy S   . Eustachian tube dysfunction, bilateral 08/10/2017  . Ganglion of joint 04/22/2007   Qualifier: Diagnosis of  By: Arnoldo Morale MD, Balinda Quails   . Gastroesophageal reflux disease 05/15/2016  . Headache 01/30/2016  . Hemorrhoids   . HEMORRHOIDS, INTERNAL 02/18/2007   Qualifier: Diagnosis of  By: Arnoldo Morale MD, Balinda Quails   . IPF (idiopathic pulmonary fibrosis) ([place]) 07/14/2018   06/22/2018-CT chest with contrast- spectrum of findings suggestive of basilar predominant fibrotic interstitial lung disease with mild honeycombing asymmetrically involving the right lung with significant progression since February/2019 in September/2019 chest CT.  Findings consistent with UIP.  06/22/2018-echocardiogram- LV ejection fraction 55 to 72%, grade 1 diastolic dysfunction, PA P pressure 4  . Lateral epicondylitis (tennis elbow)   . Lingular mass 09/07/2015  . Mass of lingula of lung 09/07/2015  . Mass of lower lobe of left lung  09/07/2015  . Mediastinal adenopathy 09/07/2015  . Mediastinal lymphadenopathy 08/27/2015  . Multiple pulmonary nodules 08/27/2015  . Nausea and vomiting 05/15/2016  . Non-small cell cancer of left lung ([place]) 09/24/2015   09/13/2015: Left lower lobe lung biopsy: Overall, the histologic features, in conjunction with the positive staining for cytokeratin 903, cytokeratin 5/6, and p63 support a diagnosis of poorly differentiated squamous cell carcinoma. There is likely sufficient  tumor remaining for additional studies, if requested. (JBK:ds 09/17/15)     . Non-small cell lung cancer (NSCLC) ([place]) dx'f 07/30/15  . Nuclear sclerosis of right eye 09/25/2015  . Odynophagia 11/06/2015  . Osteoarthritis 07/17/2010   Qualifier: Diagnosis of  By: Trellis Paganini PA-c, Amy S   . Persistent headaches 02/03/2017  . Pleural effusion on left 08/14/2016  . Pseudophakia, left eye 09/25/2015  . Pulmonary emphysema ([place]) 08/14/2016  . Radiation fibrosis of lung ([place]) 08/14/2016  . Shortness of breath 08/14/2016  . Soft tissue lesion of shoulder region 04/22/2007   Qualifier: Diagnosis of  By: Arnoldo Morale MD, Balinda Quails   Overview:  Overview:  Qualifier: Diagnosis of  By: Arnoldo Morale MD, Balinda Quails  . Spondylosis of cervical joint   . SPRAIN AND STRAIN OF SACROILIAC 09/06/2008   Qualifier: Diagnosis of  By: Arnoldo Morale MD, Balinda Quails     Past Surgical History:  Procedure Laterality Date  . COLONOSCOPY  2003  . KNEE ARTHROSCOPY  2006   right     Current Outpatient Medications  Medication Sig Dispense Refill  . ALPRAZolam (XANAX) 0.25 MG tablet Take 1 tablet (0.25 mg total) by mouth 2 (two) times daily as needed for anxiety. 45 tablet 0  . calcium carbonate (TUMS - DOSED IN MG ELEMENTAL CALCIUM) 500 MG chewable tablet Chew 1 tablet by mouth 2 (two) times daily as needed for heartburn.     . cyclobenzaprine (FLEXERIL) 10 MG tablet Take 1 tablet (10 mg total) by mouth 3 (three) times daily as needed for muscle spasms. 15 tablet 0  . esomeprazole (NEXIUM) 40 MG capsule Take 1 capsule (40 mg total) by mouth daily. 90 capsule 3  . glucosamine-chondroitin 500-400 MG tablet Take 1 tablet by mouth daily.     Marland Kitchen OFEV 150 MG CAPS Take 1 capsule (150 mg total) by mouth 2 (two) times daily. 60 capsule 11  . predniSONE (DELTASONE) 10 MG tablet TAKE 1 1/2 TABLET BY MOUTH DAILY WITH BREAKFAST (Patient taking differently: Take 15 mg by mouth daily. ) 44 tablet 3  . PROAIR HFA 108 (90 Base) MCG/ACT inhaler INHALE 2 PUFFS BY MOUTH INTO THE  LUNGS EVERY 6 HOURS AS NEEDED FOR WHEEZING OR SHORTNESS OF BREATH (Patient taking differently: Inhale 2 puffs into the lungs every 6 (six) hours as needed for wheezing or shortness of breath. ) 8.5 g 5  . rosuvastatin (CRESTOR) 5 MG tablet TAKE 1 TABLET(5 MG) BY MOUTH DAILY (Patient taking differently: Take 5 mg by mouth at bedtime. ) 90 tablet 1  . tamsulosin (FLOMAX) 0.4 MG CAPS capsule Take 1 capsule (0.4 mg total) by mouth daily. (Patient not taking: Reported on 09/27/2019) 90 capsule 3  . umeclidinium bromide (INCRUSE ELLIPTA) 62.5 MCG/INH AEPB Inhale 1 puff into the lungs daily. 90 each 3   Current Facility-Administered Medications  Medication Dose Route Frequency Provider Last Rate Last Admin  . sodium chloride flush (NS) 0.9 % injection 3 mL  3 mL Intravenous Q12H Josue Hector, MD        Allergies:   Gentamicin and  Loteprednol-tobramycin    Social History:  The patient  reports that he quit smoking about 8 years ago. His smoking use included cigarettes. He has a 12.40 pack-year smoking history. He has never used smokeless tobacco. He reports current alcohol use of about 7.0 standard drinks of alcohol per week. He reports that he does not use drugs.   Family History:  The patient's ***family history includes Coronary artery disease in an other family member; Heart disease in his mother; Hypertension in his mother.    ROS:  Please see the history of present illness.   Otherwise, review of systems are positive for {NONE DEFAULTED:18576::"none"}.   All other systems are reviewed and negative.    PHYSICAL EXAM: VS:  There were no vitals taken for this visit. , BMI There is no height or weight on file to calculate BMI. GEN: Well nourished, well developed, in no acute distress HEENT: normal Neck: no JVD, carotid bruits, or masses Cardiac: ***RRR; no murmurs, rubs, or gallops,no edema  Respiratory:  clear to auscultation bilaterally, normal work of breathing GI: soft, nontender,  nondistended, + BS MS: no deformity or atrophy Skin: warm and dry, no rash Neuro:  Strength and sensation are intact Psych: euthymic mood, full affect   EKG:  EKG {ACTION; IS/IS NSQ:58346219} ordered today. The ekg ordered today demonstrates ***   Recent Labs: 12/09/2018: Magnesium 2.0 05/18/2019: TSH 2.71 09/19/2019: ALT 16; BUN 24; Creatinine, Ser 1.06; Hemoglobin 14.8; NT-Pro BNP 275; Platelets 242.0; Potassium 4.5; Sodium 138    Lipid Panel    Component Value Date/Time   CHOL 204 (H) 08/17/2019 0737   TRIG 93.0 08/17/2019 0737   HDL 63.70 08/17/2019 0737   CHOLHDL 3 08/17/2019 0737   VLDL 18.6 08/17/2019 0737   LDLCALC 122 (H) 08/17/2019 0737   LDLDIRECT 118.5 08/21/2011 1125      Wt Readings from Last 3 Encounters:  09/26/19 175 lb (79.4 kg)  09/19/19 172 lb 9.6 oz (78.3 kg)  09/06/19 178 lb (80.7 kg)      Other studies Reviewed: Additional studies/ records that were reviewed today include: ***. Review of the above records demonstrates: ***   ASSESSMENT AND PLAN:  1.  ***   Current medicines are reviewed at length with the patient today.  The patient {ACTIONS; HAS/DOES NOT HAVE:19233} concerns regarding medicines.  The following changes have been made:  {PLAN; NO CHANGE:13088:s}  Labs/ tests ordered today include: *** No orders of the defined types were placed in this encounter.    Disposition:   FU with *** in {gen number 4-71:252712} {Days to years:10300}  Signed, Kathyrn Drown, NP  09/30/2019 11:15 AM    [facility] [address], [place], [place]  [address] Phone: [phone]; Fax: [phone]

## 2019-09-30 NOTE — Telephone Encounter (Signed)
Jason Summers is calling stating her insurance called and left message stating [name]'s procedure scheduled for 10/03/19 is not covered. Please advise.

## 2019-09-30 NOTE — Telephone Encounter (Signed)
Called patient's wife and informed her that as of right now procedure is not covered, so we will need to cancel and reschedule at a later date once approved. Patient's wife verbalized understanding and she was thankful for all the help.  [facility] number [id number] Patient ID       [id number] Episode         [id number]

## 2019-09-30 NOTE — Telephone Encounter (Signed)
Called patient's wife and let her know that Dr. Johnsie Cancel is talking to insurance company right now. Informed her that we would let her know if patient's procedure will need to be canceled before the end of the day.

## 2019-10-03 ENCOUNTER — Encounter (HOSPITAL_COMMUNITY): Payer: Medicare Other

## 2019-10-03 ENCOUNTER — Other Ambulatory Visit (HOSPITAL_COMMUNITY)
Admission: RE | Admit: 2019-10-03 | Discharge: 2019-10-03 | Disposition: A | Discharge status: Home / Self Care | Payer: Medicare Other | Source: Ambulatory Visit | Attending: Internal Medicine | Admitting: Internal Medicine

## 2019-10-03 ENCOUNTER — Ambulatory Visit (HOSPITAL_COMMUNITY): Admit: 2019-10-03 | Payer: Medicare Other | Admitting: Cardiovascular Disease

## 2019-10-03 ENCOUNTER — Other Ambulatory Visit: Payer: Self-pay | Admitting: Cardiovascular Disease

## 2019-10-03 DIAGNOSIS — I272 Pulmonary hypertension, unspecified: Secondary | ICD-10-CM

## 2019-10-03 DIAGNOSIS — Z20822 Contact with and (suspected) exposure to covid-19: Secondary | ICD-10-CM | POA: Diagnosis not present

## 2019-10-03 DIAGNOSIS — Z01812 Encounter for preprocedural laboratory examination: Secondary | ICD-10-CM | POA: Insufficient documentation

## 2019-10-03 LAB — SARS CORONAVIRUS 2 (TAT 6-24 HRS): SARS Coronavirus 2: NEGATIVE

## 2019-10-03 SURGERY — RIGHT/LEFT HEART CATH AND CORONARY ANGIOGRAPHY
Anesthesia: LOCAL

## 2019-10-03 MED ORDER — SODIUM CHLORIDE 0.9% FLUSH
3.0000 mL | Freq: Two times a day (BID) | INTRAVENOUS | Status: DC
Start: 2019-10-03 — End: 2020-01-04

## 2019-10-04 ENCOUNTER — Telehealth: Payer: Self-pay | Admitting: *Deleted

## 2019-10-04 ENCOUNTER — Telehealth: Payer: Medicare Other | Admitting: Cardiovascular Disease

## 2019-10-04 NOTE — Telephone Encounter (Signed)
Pt contacted pre-right heart cath scheduled at [facility] for: Thursday October 06, 2019 9:30 AM Verified arrival time and place: [facility]) at: 7:30 AM   No solid food after midnight prior to cath, clear liquids until 5 AM day of procedure.   AM meds can be  taken pre-cath with sip of water.  Confirmed patient has responsible adult to drive home post procedure and observe 24 hours after arriving home: yes  You are allowed ONE visitor in the waiting room during your procedures. Both you and your visitor must wear masks.      COVID-19 Pre-Screening Questions:  . In the past 7 to 10 days have you had a cough,  shortness of breath, headache, congestion, fever (100 or greater) body aches, chills, sore throat, or sudden loss of taste or sense of smell? no . Have you been around anyone with known Covid 19 in the 7 to 10 days?  no . Have you been around anyone who is awaiting Covid 19 test results in the past 7 to 10 days? no . Have you been around anyone who  has mentioned symptoms of Covid 19 within the past 7 to 10 days? No  Reviewed procedure/mask/visitor instructions, COVID-19 screening questions with patient's wife (DPR).   09/30/19 WBC 14.0 Copied from 09/30/19 CBC: Per Dr Johnsie Cancel -WBC up a bit f/u primary / pulmonary Hct fine rest pending  I discussed CBC  results and recommendations with patient's wife (DPR). [name] denies patient has fever, cough, dysuria, other signs/symptoms of infection.

## 2019-10-06 ENCOUNTER — Encounter (HOSPITAL_COMMUNITY)
Admission: RE | Disposition: A | Discharge status: Home / Self Care | Payer: Self-pay | Source: Home / Self Care | Attending: Internal Medicine

## 2019-10-06 ENCOUNTER — Other Ambulatory Visit: Payer: Self-pay

## 2019-10-06 ENCOUNTER — Ambulatory Visit (HOSPITAL_COMMUNITY)
Admission: RE | Admit: 2019-10-06 | Discharge: 2019-10-06 | Disposition: A | Discharge status: Home / Self Care | Payer: Medicare Other | Attending: Internal Medicine | Admitting: Internal Medicine

## 2019-10-06 DIAGNOSIS — R0600 Dyspnea, unspecified: Secondary | ICD-10-CM | POA: Diagnosis present

## 2019-10-06 DIAGNOSIS — Z9221 Personal history of antineoplastic chemotherapy: Secondary | ICD-10-CM | POA: Diagnosis not present

## 2019-10-06 DIAGNOSIS — Z87891 Personal history of nicotine dependence: Secondary | ICD-10-CM | POA: Diagnosis not present

## 2019-10-06 DIAGNOSIS — R0609 Other forms of dyspnea: Secondary | ICD-10-CM | POA: Diagnosis not present

## 2019-10-06 DIAGNOSIS — J84112 Idiopathic pulmonary fibrosis: Secondary | ICD-10-CM | POA: Diagnosis not present

## 2019-10-06 DIAGNOSIS — Z85118 Personal history of other malignant neoplasm of bronchus and lung: Secondary | ICD-10-CM | POA: Insufficient documentation

## 2019-10-06 DIAGNOSIS — I272 Pulmonary hypertension, unspecified: Secondary | ICD-10-CM | POA: Diagnosis not present

## 2019-10-06 DIAGNOSIS — Z923 Personal history of irradiation: Secondary | ICD-10-CM | POA: Diagnosis not present

## 2019-10-06 DIAGNOSIS — E785 Hyperlipidemia, unspecified: Secondary | ICD-10-CM | POA: Diagnosis not present

## 2019-10-06 DIAGNOSIS — K219 Gastro-esophageal reflux disease without esophagitis: Secondary | ICD-10-CM | POA: Insufficient documentation

## 2019-10-06 DIAGNOSIS — R0602 Shortness of breath: Secondary | ICD-10-CM | POA: Diagnosis present

## 2019-10-06 HISTORY — PX: RIGHT HEART CATH: CATH118263

## 2019-10-06 LAB — POCT I-STAT EG7
Acid-Base Excess: 1 mmol/L (ref 0.0–2.0)
Acid-Base Excess: 1 mmol/L (ref 0.0–2.0)
Bicarbonate: 26.3 mmol/L (ref 20.0–28.0)
Bicarbonate: 26.3 mmol/L (ref 20.0–28.0)
Calcium, Ion: 1.21 mmol/L (ref 1.15–1.40)
Calcium, Ion: 1.22 mmol/L (ref 1.15–1.40)
HCT: 40 % (ref 39.0–52.0)
HCT: 41 % (ref 39.0–52.0)
Hemoglobin: 13.6 g/dL (ref 13.0–17.0)
Hemoglobin: 13.9 g/dL (ref 13.0–17.0)
O2 Saturation: 67 %
O2 Saturation: 73 %
Potassium: 3.5 mmol/L (ref 3.5–5.1)
Potassium: 3.5 mmol/L (ref 3.5–5.1)
Sodium: 141 mmol/L (ref 135–145)
Sodium: 141 mmol/L (ref 135–145)
TCO2: 28 mmol/L (ref 22–32)
TCO2: 28 mmol/L (ref 22–32)
pCO2, Ven: 43.7 mmHg — ABNORMAL LOW (ref 44.0–60.0)
pCO2, Ven: 44.1 mmHg (ref 44.0–60.0)
pH, Ven: 7.383 (ref 7.250–7.430)
pH, Ven: 7.389 (ref 7.250–7.430)
pO2, Ven: 35 mmHg (ref 32.0–45.0)
pO2, Ven: 39 mmHg (ref 32.0–45.0)

## 2019-10-06 SURGERY — RIGHT HEART CATH
Anesthesia: LOCAL

## 2019-10-06 MED ORDER — LABETALOL HCL 5 MG/ML IV SOLN: 10.0000 mg | INTRAVENOUS | Status: DC | PRN

## 2019-10-06 MED ORDER — ASPIRIN 81 MG PO CHEW: 81.0000 mg | CHEWABLE_TABLET | ORAL | Status: DC

## 2019-10-06 MED ORDER — SODIUM CHLORIDE 0.9 % WEIGHT BASED INFUSION: 1.0000 mL/kg/h | INTRAVENOUS | Status: DC

## 2019-10-06 MED ORDER — SODIUM CHLORIDE 0.9% FLUSH: 3.0000 mL | INTRAVENOUS | Status: DC | PRN

## 2019-10-06 MED ORDER — SODIUM CHLORIDE 0.9 % IV SOLN: 250.0000 mL | INTRAVENOUS | Status: DC | PRN

## 2019-10-06 MED ORDER — SODIUM CHLORIDE 0.9 % WEIGHT BASED INFUSION: 3.0000 mL/kg/h | INTRAVENOUS | Status: DC

## 2019-10-06 MED ORDER — HEPARIN (PORCINE) IN NACL 1000-0.9 UT/500ML-% IV SOLN
INTRAVENOUS | Status: AC
  Filled 2019-10-06: qty 500

## 2019-10-06 MED ORDER — LIDOCAINE HCL (PF) 1 % IJ SOLN
INTRAMUSCULAR | Status: DC | PRN
  Administered 2019-10-06: 2 mL

## 2019-10-06 MED ORDER — SODIUM CHLORIDE 0.9% FLUSH: 3.0000 mL | Freq: Two times a day (BID) | INTRAVENOUS | Status: DC

## 2019-10-06 MED ORDER — ONDANSETRON HCL 4 MG/2ML IJ SOLN: 4.0000 mg | Freq: Four times a day (QID) | INTRAMUSCULAR | Status: DC | PRN

## 2019-10-06 MED ORDER — HEPARIN (PORCINE) IN NACL 1000-0.9 UT/500ML-% IV SOLN
INTRAVENOUS | Status: DC | PRN
  Administered 2019-10-06: 500 mL

## 2019-10-06 MED ORDER — ACETAMINOPHEN 325 MG PO TABS: 650.0000 mg | ORAL_TABLET | ORAL | Status: DC | PRN

## 2019-10-06 MED ORDER — HYDRALAZINE HCL 20 MG/ML IJ SOLN: 10.0000 mg | INTRAMUSCULAR | Status: DC | PRN

## 2019-10-06 MED ORDER — SODIUM CHLORIDE 0.9 % WEIGHT BASED INFUSION
3.0000 mL/kg/h | INTRAVENOUS | Status: AC
  Administered 2019-10-06: 3 mL/kg/h via INTRAVENOUS

## 2019-10-06 MED ORDER — LIDOCAINE HCL (PF) 1 % IJ SOLN
INTRAMUSCULAR | Status: AC
  Filled 2019-10-06: qty 30

## 2019-10-06 SURGICAL SUPPLY — 9 items
CATH BALLN WEDGE 5F 110CM (CATHETERS) ×2 IMPLANT
PACK CARDIAC CATHETERIZATION (CUSTOM PROCEDURE TRAY) ×2 IMPLANT
PROTECTION STATION PRESSURIZED (MISCELLANEOUS) ×2
SHEATH GLIDE SLENDER 4/5FR (SHEATH) ×2 IMPLANT
STATION PROTECTION PRESSURIZED (MISCELLANEOUS) ×1 IMPLANT
TRANSDUCER W/STOPCOCK (MISCELLANEOUS) ×2 IMPLANT
TUBING ART PRESS 72  MALE/FEM (TUBING) ×2
TUBING ART PRESS 72 MALE/FEM (TUBING) ×1 IMPLANT
WIRE EMERALD 3MM-J .025X260CM (WIRE) ×2 IMPLANT

## 2019-10-06 NOTE — Discharge Instructions (Signed)
BrachialSite Care  This sheet gives you information about how to care for yourself after your procedure. Your health care provider may also give you more specific instructions. If you have problems or questions, contact your health care provider. What can I expect after the procedure? After the procedure, it is common to have:  Bruising and tenderness at the catheter insertion area. Follow these instructions at home: Medicines  Take over-the-counter and prescription medicines only as told by your health care provider. Insertion site care  Follow instructions from your health care provider about how to take care of your insertion site. Make sure you: ? Wash your hands with soap and water before you change your bandage (dressing). If soap and water are not available, use hand sanitizer. ? Change your dressing as told by your health care provider. ? Leave stitches (sutures), skin glue, or adhesive strips in place. These skin closures may need to stay in place for 2 weeks or longer. If adhesive strip edges start to loosen and curl up, you may trim the loose edges. Do not remove adhesive strips completely unless your health care provider tells you to do that.  Check your insertion site every day for signs of infection. Check for: ? Redness, swelling, or pain. ? Fluid or blood. ? Pus or a bad smell. ? Warmth.  Do not take baths, swim, or use a hot tub until your health care provider approves.  You may shower 24-48 hours after the procedure, or as directed by your health care provider. ? Remove the dressing and gently wash the site with plain soap and water. ? Pat the area dry with a clean towel. ? Do not rub the site. That could cause bleeding.  Do not apply powder or lotion to the site. Activity   For 24 hours after the procedure, or as directed by your health care provider: ? Do not flex or bend the affected arm. ? Do not push or pull heavy objects with the affected arm. ? Do not  drive yourself home from the hospital or clinic. You may drive 24 hours after the procedure unless your health care provider tells you not to. ? Do not operate machinery or power tools.  Do not lift anything that is heavier than 10 lb (4.5 kg), or the limit that you are told, until your health care provider says that it is safe.  Ask your health care provider when it is okay to: ? Return to work or school. ? Resume usual physical activities or sports. ? Resume sexual activity. General instructions  If the catheter site starts to bleed, raise your arm and put firm pressure on the site. If the bleeding does not stop, get help right away. This is a medical emergency.  If you went home on the same day as your procedure, a responsible adult should be with you for the first 24 hours after you arrive home.  Keep all follow-up visits as told by your health care provider. This is important. Contact a health care provider if:  You have a fever.  You have redness, swelling, or yellow drainage around your insertion site. Get help right away if:  You have unusual pain at the brachial site.  The catheter insertion area swells very fast.  The insertion area is bleeding, and the bleeding does not stop when you hold steady pressure on the area.  Your arm or hand becomes pale, cool, tingly, or numb. These symptoms may represent a serious problem that  is an emergency. Do not wait to see if the symptoms will go away. Get medical help right away. Call your local emergency services (911 in the [place] Do not drive yourself to the hospital. Summary  After the procedure, it is common to have bruising and tenderness at the site.  Follow instructions from your health care provider about how to take care of your radial site wound. Check the wound every day for signs of infection.  Do not lift anything that is heavier than 10 lb (4.5 kg), or the limit that you are told, until your health care provider says  that it is safe. This information is not intended to replace advice given to you by your health care provider. Make sure you discuss any questions you have with your health care provider. Document Revised: 07/01/2017 Document Reviewed: 07/01/2017 Elsevier Patient Education  2020 [employer].

## 2019-10-06 NOTE — Interval H&P Note (Signed)
History and Physical Interval Note:  10/06/2019 10:10 AM  Jason Summers  has presented today for surgery, with the diagnosis of shortness of breath and pulmonary hypertension.  The various methods of treatment have been discussed with the patient and family. After consideration of risks, benefits and other options for treatment, the patient has consented to  Procedure(s): RIGHT HEART CATH (N/A) as a surgical intervention.  The patient's history has been reviewed, patient examined, no change in status, stable for surgery.  I have reviewed the patient's chart and labs.  Questions were answered to the patient's satisfaction.     [name]

## 2019-10-07 ENCOUNTER — Telehealth: Payer: Self-pay | Admitting: Internal Medicine

## 2019-10-07 NOTE — Telephone Encounter (Signed)
EMily  Pls let Jason Summers know that based on the right heart cath numbers he does not qualify for inhaled tyvaso to help his dyspnea. In other words, we do not think it will benefit his shortness of breath.   Ensure routine 30 min follow atleast > 3 months from rcent ov      SIGNATURE    Dr. Brand Males, M.D., F.C.C.P,  Pulmonary and Critical Care Medicine Staff Physician, [place] Director - Interstitial Lung Disease  Program  Pulmonary [place] [facility] [place], [place], [address]  Pager: [phone], If no answer or between  15:00h - 7:00h: call [phone] Telephone: [phone]  2:52 PM 10/07/2019     Right Heart Pressures RA (mean): 2 mmHg RV (S/EDP): 36/1 mmHg PA (S/D, mean): 32/9 (19) mmHg - does not meet criteria of >/= 25 based on increase study PCWP (mean): 8 mmHg  SpO2: 98% PA sat: 70%  Fick CO: 4.3 L/min Fick CI: 2.3 L/min/m^2  PVR: 3.9 Wood units

## 2019-10-07 NOTE — Telephone Encounter (Signed)
Called and spoke with pt's wife laura letting her know the info stated by MR. She verbalized understanding. Nothing further needed.

## 2019-10-10 ENCOUNTER — Ambulatory Visit: Payer: Medicare Other | Admitting: Cardiology

## 2019-10-23 NOTE — Progress Notes (Signed)
Cardiology Office Note   Date:  10/24/2019   ID:  Jason Summers, DOB 1932/01/06, MRN [id number]  PCP:  [name], NP  Cardiologist:  Dr. Johnsie Cancel     Chief Complaint  Patient presents with  . Hospitalization Follow-up      History of Present Illness: Jason Summers is a 86 y.o. male who presents for post hospitalization for cath Right.  Was last seen at the request of  Dr Chase Caller for dyspnea.  He is originally from [place]. 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 In 2017 had normal echo History of lung cancer stage 3 non small cell post XRT and chemo Subsequent CT with ILD and IPF Wears oxygen at night   Echo 06/22/18 with EF 55-60% only grade one diastolic dysfunction normal for age mild MR  Bone scan 02/03/19 with left hip uptake   Concern that dyspnea dysproportionate to lung disease although this is hard to  imagine given his cancer, radiation and ILD requiring oxygen   No history of CAD:  No chest pain but has not had stress testing or cath Has had increasing dyspnea last few months  Seen by pulmonary 09/19/19 and had sats 88% after ambulation Dr Chase Caller wanted to pursue right heart cath and if he has significant pulmonary HTN start him on Tyvaso which apparently has been approved for his situation now.   TTE 06/22/18 with EF 55-60%  Mild MR/TR estimated PA systolic pressure only 45 mmHg. Worsening exertional dyspnea BNP was normal 09/19/19  has had chemo XRT and ILD on oxygen.    Arranged rt heart cath for Monday 10/03/19  Rt heart cath  Conclusions: 1. Normal left and right heart filling pressures. 2. Upper normal pulmonary artery pressure with increased pulmonary vascular resistance. 3. Low normal Fick cardiac output/index.  Recommendations: 1. Ongoing management per Drs. Nishan and [place].   Dr. Chase Caller reviewed and with normal Rt heart cath numbers pt did not qualify for inhaled tyvaso.     today still SOB none  at rest, but with any activity.  No chest pain.  Discussed that I will discuss with Dr. Johnsie Cancel need for  Nuc or cardiac CTA   To rule out obstructive CAD.  Insurance would not pay for Lt heart cath.  They are to see Dr. Chase Caller tomorrow.  On recent CT angio of chest he did have foci of coronary artery calcification.  Also a small pericardial effusion.   His wife, with him today, feels he continue to have increasing DOE.   In April his pro BNP was 275  ddimer was elevated but neg CTA of chest for PE.    Past Medical History:  Diagnosis Date  . Acute rhinitis 08/10/2017  . Acute stress disorder 10/27/2007   Qualifier: Diagnosis of  By: Arnoldo Morale MD, Balinda Quails   Overview:  Overview:  Qualifier: Diagnosis of  By: Arnoldo Morale MD, Balinda Quails  . Anxiety 07/14/2018  . Arthritis   . Bilateral epiphora 09/25/2015  . Blush 09/23/2007   Qualifier: Diagnosis of  By: Arnoldo Morale MD, Balinda Quails   Overview:  Overview:  Qualifier: Diagnosis of  By: Arnoldo Morale MD, [facility])    basal cell on right temple  . Chest pain in adult 12/16/2016  . Chronic right-sided thoracic back pain 12/16/2016  . Compression fracture of body of thoracic vertebra ([place]) 02/09/2019  . Dysphagia 07/17/2010   Qualifier: Diagnosis of  By: Trellis Paganini  PA-c, Amy S   . Eustachian tube dysfunction, bilateral 08/10/2017  . Ganglion of joint 04/22/2007   Qualifier: Diagnosis of  By: Arnoldo Morale MD, Balinda Quails   . Gastroesophageal reflux disease 05/15/2016  . Headache 01/30/2016  . Hemorrhoids   . HEMORRHOIDS, INTERNAL 02/18/2007   Qualifier: Diagnosis of  By: Arnoldo Morale MD, Balinda Quails   . IPF (idiopathic pulmonary fibrosis) ([place]) 07/14/2018   06/22/2018-CT chest with contrast- spectrum of findings suggestive of basilar predominant fibrotic interstitial lung disease with mild honeycombing asymmetrically involving the right lung with significant progression since February/2019 in September/2019 chest CT.  Findings consistent with UIP.  06/22/2018-echocardiogram- LV ejection fraction 55 to  71%, grade 1 diastolic dysfunction, PA P pressure 4  . Lateral epicondylitis (tennis elbow)   . Lingular mass 09/07/2015  . Mass of lingula of lung 09/07/2015  . Mass of lower lobe of left lung 09/07/2015  . Mediastinal adenopathy 09/07/2015  . Mediastinal lymphadenopathy 08/27/2015  . Multiple pulmonary nodules 08/27/2015  . Nausea and vomiting 05/15/2016  . Non-small cell cancer of left lung ([place]) 09/24/2015   09/13/2015: Left lower lobe lung biopsy: Overall, the histologic features, in conjunction with the positive staining for cytokeratin 903, cytokeratin 5/6, and p63 support a diagnosis of poorly differentiated squamous cell carcinoma. There is likely sufficient tumor remaining for additional studies, if requested. (JBK:ds 09/17/15)     . Non-small cell lung cancer (NSCLC) ([place]) dx'f 07/30/15  . Nuclear sclerosis of right eye 09/25/2015  . Odynophagia 11/06/2015  . Osteoarthritis 07/17/2010   Qualifier: Diagnosis of  By: Trellis Paganini PA-c, Amy S   . Persistent headaches 02/03/2017  . Pleural effusion on left 08/14/2016  . Pseudophakia, left eye 09/25/2015  . Pulmonary emphysema ([place]) 08/14/2016  . Radiation fibrosis of lung ([place]) 08/14/2016  . Shortness of breath 08/14/2016  . Soft tissue lesion of shoulder region 04/22/2007   Qualifier: Diagnosis of  By: Arnoldo Morale MD, Balinda Quails   Overview:  Overview:  Qualifier: Diagnosis of  By: Arnoldo Morale MD, Balinda Quails  . Spondylosis of cervical joint   . SPRAIN AND STRAIN OF SACROILIAC 09/06/2008   Qualifier: Diagnosis of  By: Arnoldo Morale MD, Balinda Quails     Past Surgical History:  Procedure Laterality Date  . COLONOSCOPY  2003  . KNEE ARTHROSCOPY  2006   right  . RIGHT HEART CATH N/A 10/06/2019   Procedure: RIGHT HEART CATH;  Surgeon: Nelva Bush, MD;  Location: [facility];  Service: Cardiovascular;  Laterality: N/A;     Current Outpatient Medications  Medication Sig Dispense Refill  . ALPRAZolam (XANAX) 0.25 MG tablet Take 1 tablet (0.25 mg total) by mouth 2 (two)  times daily as needed for anxiety. 45 tablet 0  . calcium carbonate (TUMS - DOSED IN MG ELEMENTAL CALCIUM) 500 MG chewable tablet Chew 1 tablet by mouth 2 (two) times daily as needed for heartburn.     . cyclobenzaprine (FLEXERIL) 10 MG tablet Take 1 tablet (10 mg total) by mouth 3 (three) times daily as needed for muscle spasms. 15 tablet 0  . esomeprazole (NEXIUM) 40 MG capsule Take 1 capsule (40 mg total) by mouth daily. 90 capsule 3  . glucosamine-chondroitin 500-400 MG tablet Take 1 tablet by mouth daily.     Marland Kitchen OFEV 150 MG CAPS Take 1 capsule (150 mg total) by mouth 2 (two) times daily. 60 capsule 11  . predniSONE (DELTASONE) 10 MG tablet TAKE 1 1/2 TABLET BY MOUTH DAILY WITH BREAKFAST 44 tablet 3  .  PROAIR HFA 108 (90 Base) MCG/ACT inhaler INHALE 2 PUFFS BY MOUTH INTO THE LUNGS EVERY 6 HOURS AS NEEDED FOR WHEEZING OR SHORTNESS OF BREATH 8.5 g 5  . rosuvastatin (CRESTOR) 5 MG tablet TAKE 1 TABLET(5 MG) BY MOUTH DAILY 90 tablet 1  . tamsulosin (FLOMAX) 0.4 MG CAPS capsule Take 1 capsule (0.4 mg total) by mouth daily. 90 capsule 3  . umeclidinium bromide (INCRUSE ELLIPTA) 62.5 MCG/INH AEPB Inhale 1 puff into the lungs daily. 90 each 3   Current Facility-Administered Medications  Medication Dose Route Frequency Provider Last Rate Last Admin  . sodium chloride flush (NS) 0.9 % injection 3 mL  3 mL Intravenous Q12H Josue Hector, MD      . sodium chloride flush (NS) 0.9 % injection 3 mL  3 mL Intravenous Q12H Josue Hector, MD        Allergies:   Gentamicin and Loteprednol-tobramycin    Social History:  The patient  reports that he quit smoking about 8 years ago. His smoking use included cigarettes. He has a 12.40 pack-year smoking history. He has never used smokeless tobacco. He reports current alcohol use of about 7.0 standard drinks of alcohol per week. He reports that he does not use drugs.   Family History:  The patient's family history includes Coronary artery disease in an other  family member; Heart disease in his mother; Hypertension in his mother.    ROS:  General:no colds or fevers, no weight changes Skin:no rashes or ulcers HEENT:no blurred vision, no congestion CV:see HPI PUL:see HPI GI:no diarrhea constipation or melena, no indigestion GU:no hematuria, no dysuria MS:no joint pain, no claudication Neuro:no syncope, no lightheadedness Endo:no diabetes, no thyroid disease  Wt Readings from Last 3 Encounters:  10/24/19 175 lb 12.8 oz (79.7 kg)  10/06/19 168 lb (76.2 kg)  09/26/19 175 lb (79.4 kg)     PHYSICAL EXAM: VS:  BP 118/74   Pulse 90   Ht _0  (1.676 m)   Wt 175 lb 12.8 oz (79.7 kg)   SpO2 92%   BMI 28.37 kg/m  , BMI Body mass index is 28.37 kg/m. General:Pleasant affect, NAD Skin:Warm and dry, brisk capillary refill HEENT:normocephalic, sclera clear, mucus membranes moist, hard of hearing Neck:supple, no JVD, no bruits  Heart:S1S2 RRR without murmur, gallup, rub or click Lungs:clear without rales, rhonchi, or wheezes YOV:ZCHY, non tender, + BS, do not palpate liver spleen or masses Ext:no lower ext edema, 2+ pedal pulses, 2+ radial pulses Neuro:alert and oriented X 3, MAE, follows commands, + facial symmetry    EKG:  EKG is NOT ordered today.    Recent Labs: 12/09/2018: Magnesium 2.0 05/18/2019: TSH 2.71 09/19/2019: ALT 16; NT-Pro BNP 275 09/30/2019: BUN 23; Creatinine, Ser 0.99; Platelets 181 10/06/2019: Hemoglobin 13.6; Potassium 3.5; Sodium 141    Lipid Panel    Component Value Date/Time   CHOL 204 (H) 08/17/2019 0737   TRIG 93.0 08/17/2019 0737   HDL 63.70 08/17/2019 0737   CHOLHDL 3 08/17/2019 0737   VLDL 18.6 08/17/2019 0737   LDLCALC 122 (H) 08/17/2019 0737   LDLDIRECT 118.5 08/21/2011 1125       Other studies Reviewed: Additional studies/ records that were reviewed today include: . Rt heart cath Conclusions: 1. Normal left and right heart filling pressures. 2. Upper normal pulmonary artery pressure with  increased pulmonary vascular resistance. 3. Low normal Fick cardiac output/index.  Recommendations: 1. Ongoing management per Drs. Nishan and [place].  Nelva Bush, MD  Echo  06/22/18 Study Conclusions   - Left ventricle: The cavity size was normal. Wall thickness was  normal. Systolic function was normal. The estimated ejection  fraction was in the range of 55% to 60%. Wall motion was normal;  there were no regional wall motion abnormalities. Doppler  parameters are consistent with abnormal left ventricular  relaxation (grade 1 diastolic dysfunction).  - Aortic valve: There was trivial regurgitation.  - Mitral valve: There was mild regurgitation.  - Pulmonary arteries: Systolic pressure was mildly increased. PA  peak pressure: 45 mm Hg (S).   Impressions:   - Normal LV systolic function; mild diastolic dysfunction; mild MR;  mild TR; mild pulmonary hypertension.   ASSESSMENT AND PLAN:  1.  DOE most likely related to prior lung cancer and RX and ILD.  No PE on recent CTA of chest.  Rt cardiac cath with normal numbers.  Not a candidate for Tyvaso.  - only has dyspnea with exertion, possible anginal equivalent.  Will check with Dr. Johnsie Cancel if he  Prefers cardiac CTA vs nuc study.   Follow up with Dr. Johnsie Cancel.  2.  Lung cancer followed by Pulmoanry and to see him tomorrow.  3.  HLD on statin and LDL 121     Current medicines are reviewed with the patient today.  The patient Has no concerns regarding medicines.  The following changes have been made:  See above Labs/ tests ordered today include:see above  Disposition:   FU:  see above  Signed, Cecilie Kicks, NP  10/24/2019 8:57 AM    [address], [place], [place] Phone: [phone]; Fax: [phone]

## 2019-10-24 ENCOUNTER — Ambulatory Visit (INDEPENDENT_AMBULATORY_CARE_PROVIDER_SITE_OTHER): Payer: Medicare Other | Admitting: Cardiology

## 2019-10-24 ENCOUNTER — Other Ambulatory Visit: Payer: Self-pay

## 2019-10-24 ENCOUNTER — Encounter: Payer: Self-pay | Admitting: Cardiology

## 2019-10-24 VITALS — BP 118/74 | HR 90 | Ht 66.0 in | Wt 175.8 lb

## 2019-10-24 DIAGNOSIS — R0600 Dyspnea, unspecified: Secondary | ICD-10-CM | POA: Diagnosis not present

## 2019-10-24 DIAGNOSIS — E7800 Pure hypercholesterolemia, unspecified: Secondary | ICD-10-CM

## 2019-10-24 DIAGNOSIS — R0609 Other forms of dyspnea: Secondary | ICD-10-CM

## 2019-10-24 NOTE — Patient Instructions (Addendum)
Medication Instructions:  *If you need a refill on your cardiac medications before your next appointment, please call your pharmacy*  Lab Work: If you have labs (blood work) drawn today and your tests are completely normal, you will receive your results only by: Marland Kitchen MyChart Message (if you have MyChart) OR . A paper copy in the mail If you have any lab test that is abnormal or we need to change your treatment, we will call you to review the results.  Testing/Procedures: None ordered today.  Follow-Up: At [facility], you and your health needs are our priority.  As part of our continuing mission to provide you with exceptional heart care, we have created designated Provider Care Teams.  These Care Teams include your primary Cardiologist (physician) and Advanced Practice Providers (APPs -  Physician Assistants and Nurse Practitioners) who all work together to provide you with the care you need, when you need it.  We recommend signing up for the patient portal called "MyChart".  Sign up information is provided on this After Visit Summary.  MyChart is used to connect with patients for Virtual Visits (Telemedicine).  Patients are able to view lab/test results, encounter notes, upcoming appointments, etc.  Non-urgent messages can be sent to your provider as well.   To learn more about what you can do with MyChart, go to [email or web address].    Your next appointment:   4 month(s)  The format for your next appointment:   In Person  Provider:   You may see Jenkins Rouge, MD or one of the following Advanced Practice Providers on your designated Care Team:    Truitt Merle, NP  Cecilie Kicks, NP  Kathyrn Drown, NP

## 2019-10-25 ENCOUNTER — Encounter: Payer: Self-pay | Admitting: Internal Medicine

## 2019-10-25 ENCOUNTER — Ambulatory Visit (INDEPENDENT_AMBULATORY_CARE_PROVIDER_SITE_OTHER): Payer: Medicare Other | Admitting: Internal Medicine

## 2019-10-25 ENCOUNTER — Encounter: Payer: Self-pay | Admitting: Adult Health

## 2019-10-25 ENCOUNTER — Telehealth: Payer: Self-pay | Admitting: Internal Medicine

## 2019-10-25 ENCOUNTER — Ambulatory Visit (INDEPENDENT_AMBULATORY_CARE_PROVIDER_SITE_OTHER): Payer: Medicare Other | Admitting: Adult Health

## 2019-10-25 VITALS — BP 116/62 | HR 101 | Ht 66.0 in | Wt 175.2 lb

## 2019-10-25 VITALS — BP 129/70 | Wt 175.0 lb

## 2019-10-25 DIAGNOSIS — F329 Major depressive disorder, single episode, unspecified: Secondary | ICD-10-CM | POA: Diagnosis not present

## 2019-10-25 DIAGNOSIS — R0602 Shortness of breath: Secondary | ICD-10-CM

## 2019-10-25 DIAGNOSIS — F419 Anxiety disorder, unspecified: Secondary | ICD-10-CM

## 2019-10-25 DIAGNOSIS — Z5181 Encounter for therapeutic drug level monitoring: Secondary | ICD-10-CM | POA: Diagnosis not present

## 2019-10-25 DIAGNOSIS — J439 Emphysema, unspecified: Secondary | ICD-10-CM | POA: Diagnosis not present

## 2019-10-25 DIAGNOSIS — J3800 Paralysis of vocal cords and larynx, unspecified: Secondary | ICD-10-CM

## 2019-10-25 DIAGNOSIS — J84112 Idiopathic pulmonary fibrosis: Secondary | ICD-10-CM

## 2019-10-25 DIAGNOSIS — R490 Dysphonia: Secondary | ICD-10-CM

## 2019-10-25 DIAGNOSIS — C3492 Malignant neoplasm of unspecified part of left bronchus or lung: Secondary | ICD-10-CM | POA: Diagnosis not present

## 2019-10-25 DIAGNOSIS — R0981 Nasal congestion: Secondary | ICD-10-CM

## 2019-10-25 MED ORDER — CITALOPRAM HYDROBROMIDE 10 MG PO TABS: 10.0000 mg | ORAL_TABLET | Freq: Every day | ORAL | 1 refills | Status: DC

## 2019-10-25 NOTE — Progress Notes (Signed)
Virtual Visit via Telephone Note  I connected with Jason Summers on 10/25/19 at  2:30 PM EDT by telephone and verified that I am speaking with the correct person using two identifiers.   I discussed the limitations, risks, security and privacy concerns of performing an evaluation and management service by telephone and the availability of in person appointments. I also discussed with the patient that there may be a patient responsible charge related to this service. The patient expressed understanding and agreed to proceed.  Location patient: home Location provider: work or home office Participants present for the call: patient, provider Patient did not have a visit in the prior 7 days to address this/these issue(s).   History of Present Illness: 86 year old male who is being evaluated today for anxiety and depression.  Earlier today he had a appointment with his pulmonologist, Dr. Chase Caller and his pulmonologist reached out to me directly about starting this patient on an antianxiety/anti press of medication.  When talking to the patient he does report that he often does feel anxious as well as depressed due to his declining health.  He has Xanax prescribed but his wife reports that he has not been taking this medication.  He is okay with starting SSRI if it means that he will have a better quality of life.   Observations/Objective: Patient sounds cheerful and well on the phone. I do not appreciate any SOB. Speech and thought processing are grossly intact. Patient reported vitals:  Assessment and Plan: 1. Anxiety and depression -Encourage patient to take Xanax as directed as this will cut down on his anxiety and he may have some easier time breathing.  Will prescribe Celexa 10 mg daily.  He will follow-up in 1 month to see how he is doing and at that time we can consider increasing his Celexa. - citalopram (CELEXA) 10 MG tablet; Take 1 tablet (10 mg total) by mouth daily.  Dispense: 30 tablet;  Refill: 1   Follow Up Instructions:   I did not refer this patient for an OV in the next 24 hours for this/these issue(s).  I discussed the assessment and treatment plan with the patient. The patient was provided an opportunity to ask questions and all were answered. The patient agreed with the plan and demonstrated an understanding of the instructions.   The patient was advised to call back or seek an in-person evaluation if the symptoms worsen or if the condition fails to improve as anticipated.  I provided  15 minutes of non-face-to-face time during this encounter.   [name], NP

## 2019-10-25 NOTE — Telephone Encounter (Signed)
Dr. Mirna Mires -   We can get Josel scheduled to discuss this. Thanks for letting me know   - Tommi Rumps

## 2019-10-25 NOTE — Telephone Encounter (Signed)
Dear Jason Summers (Nafziger, Jason Rumps, NP)   -Jason Summers has significant anxiety/dpression playing into his dyspnea. Could you  Please work with patient to get started on a long term medication such as sSRI/SNRI as you see fit   Thanks    SIGNATURE    Dr. Brand Males, M.D., F.C.C.P,  Pulmonary and Critical Care Medicine Staff Physician, [place] Director - Interstitial Lung Disease  Program  Pulmonary [place] [facility] [place], [place], [address]  Pager: [phone], If no answer or between  15:00h - 7:00h: call [phone] Telephone: [phone]  10:04 AM 10/25/2019

## 2019-10-25 NOTE — Addendum Note (Signed)
Addended by: Vanessa Barbara on: 10/25/2019 10:36 AM   Modules accepted: Orders

## 2019-10-25 NOTE — Telephone Encounter (Signed)
Pt scheduled to see Tommi Rumps today at 2:30.  Nothing further needed.

## 2019-10-25 NOTE — Telephone Encounter (Signed)
Thanks [employer] Repl;y not needed

## 2019-10-25 NOTE — Patient Instructions (Addendum)
ICD-10-CM   1. IPF (idiopathic pulmonary fibrosis) ([place])  J84.112   2. Therapeutic drug monitoring  Z51.81   3. Pulmonary emphysema, unspecified emphysema type ([place])  J43.9   4. Non-small cell cancer of left lung (H[facility])  C34.92   5. Anxiety  F41.9   6. Hoarseness of voice  R49.0   7. Vocal cord paralysis  J38.00   8. Stuffy head  R09.81   9. Shortness of breath  R06.02      IPF (idiopathic pulmonary fibrosis) (H[facility]) with out of proportion shortness of breath  - clinically stable as of today but this is a progressive disease and over time can become major reason for shortness of breath. The ofev Is helping you by trying to prevent progression   - LFT normal Mid April 2021 - Right heart cath April 2021 did not show elevation of PA pressures such that tyvaso would be of benefit  plan - continue ofev as before - check LFT in 3 months from this visit - no role for Tyvaso   Pulmonary emphysema, unspecified emphysema type ([facility]) - stable  - stable without flare up but inhalers not helping   Plan: -.Can reduce prednisone to 47m per day  Albuterol as needed - stop incruse due to lack of help   Abnormal computed tomography of thoracic spine - new  Aug 2020 Non-small cell cancer of left lung ([place]   - in remission per CT scan April 2021  Plan  - per oncology  Vocal cord paralysis Hoarse voice Stuffy head   - this seems worse on exam esp hoarse voice  Plan  - refer Dr RConstance Holstersince Dr WErik Obeyretired  Anxiety/Dpression   - this is a major issue and needs control to help quality of life  Plan  - d/w PCP Nafziger, CTommi Rumps NP - to get started on a sNRI/SSRI (I have sent a messagE)  Shortness of breath   - due to all of the above reasons    Plan  - recommend rehab  -btu respect youy declined  - currently do not qualify for o2 - work on vocal cord isssue and anxiety   Followup  3 months for ILD clinic followup - 30 min slot

## 2019-10-25 NOTE — Progress Notes (Signed)
IOV 12/16/2016  Chief Complaint  Patient presents with  . Acute Visit    Pt c/o chest tightness, mid back pain, and right lateral rib pain when pt takes a deep breath in. Pt also c/o mild dry cough with some chest congestion. Pt denies f/c/s.     86 year old male with stage III non-small cell lung cancer and resultant radiation related pleural effusion. This supposed to go to [place] his native country in June 2018 but he canceled because of his declining health. He tells me that his functional status is pretty good. His appetite is pretty good. However he is having worsening lower thoracic back pain. He saw my colleague approximately one month ago. Chest x-ray was unchanged. He was advised nonsteroidal anti-inflammatory drugs and Tylenol for pain relief. But now the pain has broken through. The pain is worse. It is present in the midthoracic right infra-axillary and lower anterior chest area. It gets worse with inspiration. It is severe. He feels he might need something stronger for pain relief. There is no neurologic deficit. There is no constipation. His effort tolerance is okay with there is slight increased fatigue.   OV 03/27/2017  Chief Complaint  Patient presents with  . Follow-up    Pt still has some pain in his back but it is better than it ws at last visit. Pt has noticed that he will occ. lose his voice and he has become weak, especially in the mornings. [name] states that pt has been taking some OTC for a cough and states that occ. it will be hard for him to breathe and does have occ. CP.    86 year old male with non-small cell lung cancer on observation therapy. I last saw him in July 2018. At that time he was bothered by left-sided chest pain. There was concern about cancer recurrence but a PET scan at that time showed improvement. Since then he's been on observation therapy.He now presents with his wife. He tells me that the pain is not much of an issue. He only has very  mild shortness of breath. But they both tell me that he is extremely fatigued. Particularly early in the morning. He has insomnia. Wife does think that he is deconditioned. Last lab work in August 2018 was fine. Has no symptoms of COPD exacerbation. He is up-to-date with his flu shot.he has had some headaches and neurology for that He might be depressed Walking desaturation test 185 feet 3 laps on room air: He finished it without any problems other than mild shortness of breath. Resting heart rate was 87/m. Final heart rate was 98/m. Resting pulse ox 100% final pulse ox 98%.   OV 06/29/2017  Chief Complaint  Patient presents with  . Follow-up    Pt and his wife states he is doing much better since last visit. Pt has been going to PT which has helped him gain strengh back, walking better, and brething better.   He has mild emphysema and this is follow-up for that.  He is here with his wife.  They tell me that he is not taking his inhalers.  Because it really does not benefit him.  The bigger issues that have been recently dealing with other issues of fatigue and low motivation and physical deconditioning.  Therefore at last visit we put him into pulmonary rehabilitation.  We checked a lot of his blood work it ended up that his vitamin D was very low.  Put him on vitamin  D replacement.  Now he reports that with both vitamin D replacement and with pulmonary rehabilitation he has significant improvement in his fatigue and energy levels.  He feels back to baseline.  In particular tl pulmonary rehabilitation has helped him.  They both are worried about the extent of lung cancer and the odds of recurrence.  The having follow-up CT scan with Dr. Julien Nordmann in a month or 2.  They feel that if his physical condition will allow and the CT scan does not show any recurrence he would want to visit [place] in the summer 2019.  Otherwise feeling good.   OV 10/28/2017  Chief Complaint  Patient presents with  . Follow-up     Pt states in the mornings it is hard to breathe. States he believes the fatigue has become better.   Jason Summers presents for follow-up with his wife.  The issues to be discussed are emphysema, shortness of breath and fatigue with vitamin D deficiency in the setting of lung cancer on observation therapy.  In addition there is travel advice coming up in 2 weeks he is going to [place] for a few months.  Overall stable.  He is no longer doing pulmonary rehab.  He uses only albuterol as needed because he does not want to do maintenance inhalers.  We told him to do his vitamin D once a month but I am not so sure as to what regimen he is following right now.  In the interim it appears that exertional dyspnea slightly worse although the fatigue component might be the same.  Wife feels is because he is anxious and because he wants to do more than his body is allowing him to do.  He does not follow the pulmonary rehabilitation home prescription accurately.  I reviewed the old chart found that Dr. Julien Nordmann has placed him on observation therapy after seeing him in March 2019.  Apparently there is a scan coming up in September 2019.  He clearly wants to be able to do more.  Walking desaturation test he got tachycardic and did not desaturate below 97%.  He is going to [place] early part of June 2019.  Is a long flight to [place].  Given the persistence of his dyspnea we did a deep dive with an objective dyspnea questionnaire.  It appears that the worst his dyspnea is level 3 out of 5.  He is level 3 for walking on a level with others of his age and walking uphill, picking up and straightening.  He is a level 2 dyspnea walking upstairs and washing the car.  He is a level 1 dyspnea for dressing watering the lawn sexual activities.  But is a level 0 dyspnea while eating or standing up from a chair of brushing teeth or showering or shaving.  It is associated with fatigue and arthralgia     DIAGNOSIS: Stage IIIA (T3, N2,  M0) non-small cell lung cancer, poorly differentiated squamous cell carcinoma diagnosed in February 2017 with PDL 1 expression of 35%, presented with large left lower lobe lung mass in addition to left upper lobe lung nodule and mediastinal lymphadenopathy. PRIOR THERAPY: Course of concurrent chemoradiation with weekly carboplatin for AUC of 2 and paclitaxel 45 MG/M2. Status post 7 cycles, last dose was given 11/19/2015 with partial response.  CURRENT THERAPY: Observation.       OV 03/03/2018  Subjective:  Patient ID: Jason Summers, male , DOB: 1932-02-22 , age 72 y.o. , MRN: [id number] , ADDRESS: [place]  [address] [place] [address]   03/03/2018 -   Chief Complaint  Patient presents with  . Follow-up    SOB with exertion, still on Spiriva and feels it is working      HPI [employer] 86 y.o. -follow-up for shortness of breath related to radiation fibrosis of the lung, mild emphysema and physical deconditioning.  After last visit in May 2019 we started him on Spiriva.  He says this helped only a little bit.  He still has fixed dyspnea on exertion relieved by rest.  He was in [place] and this limited his exertion at various places.  However he says overall he is stable since last visit.  He is definitely not worse.  No associated chest pain or wheezing or cough.  He had a CT scan of the chest September 2019 that I personally visualized and agree with the report that this shows radiation related changes but no active cancer.  Also for the last few months he is on and off hoarseness of voice with variable tone in his voice.  Wife thinks is because of allergies.  Wife feels that patient has fixed issues and needs to accept his quality of life and status and health but patient feels that he constantly wants to get better.  No fever      OV 06/15/2018  Subjective:  Patient ID: Jason Summers, male , DOB: 24-Apr-1932 , age 76 y.o. , MRN: [id number] , ADDRESS: [place]  [id number]   06/15/2018 -   Chief Complaint  Patient presents with  . Follow-up    Since the dx of lung cancer 3 years ago, pt has has had more problems with his breathing and has to stop multiple times while walking to catch his breath. Pt has an occ cough but states he has been having a lot of headaches and fatigue. Pt has also been having problems with shaking and also unable to talk to people due the SOB.     HPI Domanique Schellinger 86 y.o. -presents acutely with his wife.  Since I last saw him in September 2019 he has had worsening shortness of breath.  This is to the point now that when he climbs a flight of stairs that he is panting quite a bit.  He also has to stop while he climbs.  Normal exertion is now becoming difficult.  It takes a long time for him to recover to the point that he is not able to talk immediately after exertion.  At rest he feels fine.  The wife also states is significantly depressed.  He is asking for himself to be checked into a nursing home which the wife is reluctant to do.  He is having nonspecific headaches that are chronic in nature.  But when he bends his head down it gets worse.  They believe in the last few months this is also worse.  There is no chest pain or wheezing or pedal edema or hemoptysis or weight loss.  OV 06/25/2018  Subjective:  Patient ID: Jason Summers, male , DOB: 03-15-1932 , age 33 y.o. , MRN: [id number] , ADDRESS: [place] [facility] [id number]   06/25/2018 -   Chief Complaint  Patient presents with  . Follow-up    Pt states he is about the same as last visit. SOB is about the same and pt is still fatigued.     HPI Jason Summers 86 y.o. -presents for follow-up after work-up.  He had CT chest  June 22, 2018 that I personally visualized.  The radiologist reporting presence of UIP with honeycombing.  I visualized this and agree with the findings.  He had echocardiogram June 22, 2018 that shows grade 1 diastolic dysfunction and slightly  elevated pulmonary artery systolic pressure with ejection fraction 55-60%.  In retrospect his CT chest from early 2019 shows ILD findings.  This corresponds with the time that he has been getting worse.  Walking desaturation test still shows adequate oxygenation but tendency to drop.  Compared to his last visit there are no new interim problems.  He is not interested in doing pulmonary rehabilitation.  At this point in time discussion focused on IPF diagnosis, prognosis and treatment.  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  He smoked in the remote past.  He did get chemo and radiation for his lung few to several years ago    OV 08/12/2018  Subjective:  Patient ID: Jason Summers, male , DOB: 1932/02/14 , age 66 y.o. , MRN: [id number] , ADDRESS: [place] [id number]   08/12/2018 -   Chief Complaint  Patient presents with  . Follow-up    PFT attempted but pt was unable to complete. Pt states breathing has become worse since last visit. States he was wearing O2 at night but then began to become choked from it and had to stop wearing it. Pt also attempted to wear the O2 during the day as needed and had same problems with it. Pt also has had some chest tightness when he is unable to get his breath. Denies any complaints of cough.     HPI Jason Summers 86 y.o. -presents 5-year follow-up.  He presents with his wife.  He is now been on nintedanib since late January 2020.  He says he is tolerating it well although he has had several pound weight loss.  But other than this is not having any side effects from the nintedanib.  He was started on nighttime oxygen but he says he does not want to use it because the oxygen tubing is choking him.  His wife and he are very concerned about the level of shortness of breath.  They feel there is a decline in the last 1 year which we do know is from progressive ILD.  They do agree that it does not decline since early 2020/January 2020.  Nevertheless the symptom  burden is very high and this is documented below.  Wife states that he is very despondent and anxious and apparently this is his baseline.  He is also less active.  Wife states that he is always focused on the shortness of breath.  Multiple different questions about how to get better and move forward.  Questions about IPF prognosis.  Also with the upcoming pandemic of the coronavirus-asking for travel advice.  They do have family visiting from [place] and [place] by air.  His walking desaturation test today shows stability compared to the previous visit.   OV 12/09/2018  Subjective:  Patient ID: Jason Summers, male , DOB: 27-Aug-1931 , age 70 y.o. , MRN: [id number] , ADDRESS: [id number] [place] [facility] [address]   12/09/2018 -   Chief Complaint  Patient presents with  . Follow-up   IPF follow-up.  On nintedanib since late January 2020  HPI Jason Summers 86 y.o. -returns for follow-up with his wife.  Last seen 3 pandemic.  Since then his weight has been stable.  He was initially losing weight on  nintedanib.  Initially they told me that he has no side effects with nintedanib but then as I question further they tell me that for the last 1 month he has had abdominal bloating a few episodes of diarrhea some cramping all of moderate intensity.  But the bigger complaint is progressive shortness of breath despite nintedanib.  He cannot do pulmonary function test because of technical issues.  His last CT scan was in January 2020 that showed progression.  He and his wife are asking for a repeat CT scan of the chest.  He has been cleared for follow-up from a oncology perspective for his lung cancer from Dr. Julien Nordmann.  But apparently has a CT scans every 6 months.  His last echo was in January 2020 that showed diastolic dysfunction and elevated pulmonary systolic pressure he is heavily burdened by shortness of breath.  So far we have not been able to fix it or   OV 01/26/2019  Subjective:  Patient ID: Jason Summers,  male , DOB: September 24, 1931 , age 41 y.o. , MRN: [id number] , ADDRESS: [place] [id number]  IPF follow-up.  On nintedanib since late January 2020   01/26/2019 -  IPF followup after CT chest. Dyspnea worse. -Dyspnea felt to be multifactorial due to radiation related pleural effusion, lung cancer, IPF and also diastolic dysfunction associated with elevated pulmonary artery systolic pressure on echocardiogram 2020.   HPI Jason Summers 86 y.o. -presents with his wife.  Last visit was in July 2020.  He has had worsening dyspnea.  So we got a CT scan of the chest that shows stable pulmonary fibrosis compared to January 2020 but he tells me that even since July 2020 his dyspnea on exertion is worse.  He has told his wife that he is extremely worried that he will be found dead.  The CT scan also shows a T6 sclerotic lesion that and review of the previous radiology results have been present for at least over a year.  Given the redemonstration of this I discussed with his oncologist Dr. Julien Nordmann and he suggested getting a bone scan.  Patient is no longer actively following with Dr. Julien Nordmann because the cancer is believed to be under remission/control.  CT scan also the impression reported coronary artery calcifications.  Patient tells me that he has not had a cardiac stress test in over 10 years.  He does not have an active cardiologist.  Overall he is tolerating nintedanib quite well except for mild abdominal bloating and nausea which he is able to control.  He has not lost any weight.     CT chest 01/13/2019  IMPRESSION: 1. No significant interval change in moderate to severe pulmonary fibrosis in a pattern with apical to basal gradient featuring traction bronchiectasis, peripheral bronchiolectasis and honeycombing, most notable in the right lung base. Although not significantly changed when compared to immediate prior examination, findings are substantially and rapidly worsened on  sequential examinations over time dating back to 12/31/2015. Findings remain in a UIP pattern and consistent with clinical diagnosis of IPF.  2. No change in post treatment appearance of the left lower lobe. Unchanged small, loculated left pleural effusion.  3. Redemonstrated sclerotic wedge deformity of T6, which remains generally suspicious for osseous metastasis.  4. Coronary artery disease, aortic atherosclerosis, and aortic valve calcifications.   Electronically Signed   By: Eddie Candle M.D.   On: 01/13/2019 09:50 ROS - per HPI    OV 03/28/2019  Subjective:  Patient  ID: Jason Summers, male , DOB: 04/23/1932 , age 32 y.o. , MRN: [id number] , ADDRESS: [place] [id number]   03/28/2019 -   Chief Complaint  Patient presents with  . Follow-up    Taking OFev. He states he is still using the Mylanta that helps with his mild GI symptoms.    Idiopathic pulmonary fibrosis  Multifactorial dyspnea  -IPF -Vocal cord paralysis diagnosed 2020 unilateral -Diastolic dysfunction -Anxiety/depression -Lung cancer status post radiation and expectant follow-up  HPI Jason Summers 86 y.o. -presents for follow-up.  He has idiopathic pulmonary fibrosis in the history of lung cancer in the background for which he is on expectant follow-up with a CT scan monitoring under pulmonary.  He is out of proportion shortness of breath.  Last seen August 2020.  He had significant out of proportion shortness of breath.  Therefore referred him to cardiology.  He saw Dr. Marcelle Smiling in March 25, 2019.  It was felt that most of the shortness of breath was pulmonary and deconditioning anxiety related.  Expectant follow-up for cardiology related diastolic dysfunction was adopted.  It was felt this was age-related.  He also saw Dr. Julien Nordmann for his thoracic T6 spinal lesion.  It was felt this could be either malignant or traumatic related.  He declined kyphoplasty because his symptoms are  really mild and they did not want to make things worse.  At this point in time he feels his IPF is stable.  He is tolerating his nintedanib quite well.  He has gained weight.  He has occasional mild abdominal pain for which he takes Mylanta.  He is actually gaining weight.  However he tells me his shortness of breath continues to be worse.  His walking desaturation test on the other hand is stable.  His August 2020 CT scan was stable.  From a therapeutic monitoring standpoint he needs to have a liver function test today.  He has a new complaint of hoarseness of voice for the last 1 month.  It is present all the time not associated with dysphagia is mild to moderate in intensity.  No associated symptoms other than worsening shortness of breath.       OV 06/30/2019  Subjective:  Patient ID: Jason Summers, male , DOB: February 04, 1932 , age 21 y.o. , MRN: [id number] , ADDRESS: [id number] [place] [address]  Idiopathic pulmonary fibrosis  - on ofev late Jan 2020 -Last CT scan of the chest August 2020  Multifactorial dyspnea  -IPF - Associated mild emphysema - on incruse and - Daily prednisome since May 2020 Wyn Quaker, FNP started prednisone taper in March 2020 for potential IPF flare with myriad of symptoms of shortness of breath. Patient continued to have dyspena. He reported improvement with prednisone use and Wyn Quaker, FNP placed patient on predn isone daily indefinitely in May 2020) -Vocal cord paralysis diagnosed 2020 Oct/nov  unilateral - [name] - on expectant followup -Diastolic dysfunction - mild on echo Jan 2020  - Mild Pulm Htn on echo Jan 2020 - Pulmonary arteries: Systolic pressure was mildly increased. PA   peak pressure: 45 mm Hg  -Anxiety/depression -Lung cancer status post radiation and expectant follow-up - DIAGNOSIS: Stage IIIA (T3, N2, M0) non-small cell lung cancer, poorly differentiated squamous cell carcinoma diagnosed in February 2017 with PDL 1 expression of 35%,  presented with large left lower lobe lung mass in addition to left upper lobe lung nodule and mediastinal lymphadenopathy. PRIOR THERAPY: Course of concurrent  chemoradiation with weekly carboplatin for AUC of 2 and paclitaxel 45 MG/M2. Status post 7 cycles, last dose was given 11/19/2015 with partial response.  CURRENT THERAPY: Observation.   -T6 lesion followed up with a bone scan in October 2020   06/30/2019 -   Chief Complaint  Patient presents with  . Follow-up    IPF (idiopathic pulmonary fibrosis) (HCC)     HPI Jason Summers 86 y.o. -presents for follow-up of the above issues.  He is currently on nintedanib for his IPF.  He is tolerating it fine no GI side effects.  He has 1 month left.  He needs a refill of this/new prescription.  This is because of the change in rules with [facility].  Overall stable.  Wife however feels he is slowly declining.  She says the pulmonary is the only issue because otherwise he is eating well and sleeping well.  Apparently is also complaining of some restless legs which have asked him to talk to the primary care physician.  Again as before the overwhelming issues the dyspnea.  On a subjective symptom score his dyspnea appears similar to before but objectively the desaturation gradient is definitely worse in the last 1 year although the CT scan itself has been stable from earlier in January through August 2020.  Reviewed all this information again and noticed that he does have evidence of pulmonary hypertension.  This past week and the new [place] journal medicine the "increase study" was published showing a beneficial effect in 6-minute walk distance and possibly dyspnea in patient taking inhaled nebulized treprostinil in the setting of ILD including IPF.  I did discuss his vocal cord paralysis and is the 6 final lesions that became evident in October/November 2020.  I discussed this with Dr. Julien Nordmann his oncologist.  He indicated I could get a PET scan.  Patient is  agreeable for this.   ROS - per HPI    OV 10/25/2019  Subjective:  Patient ID: Jason Summers, male , DOB: 21-Jan-1932 , age 64 y.o. , MRN: [id number] , ADDRESS: [place] [facility] [address]   10/25/2019 -   Chief Complaint  Patient presents with  . Follow-up    ILD.  sob worse, can walk shorter distances and get sob.   Idiopathic pulmonary fibrosis  - on ofev late Jan 2020 -Last CT scan of the chest August 2020  Multifactorial dyspnea  -IPF  - Associated mild emphysema - on incruse and  - Daily prednisome since May 2020 Wyn Quaker, FNP started prednisone taper in March 2020 for potential IPF flare with myriad of symptoms of shortness of breath. Patient continued to have dyspena. He reported improvement with prednisone use and Wyn Quaker, FNP placed patient on predn isone daily indefinitely in May 2020)  -Vocal cord paralysis diagnosed 2020 Oct/nov  unilateral - [name] - on expectant followup  -Diastolic dysfunction - mild on echo Jan 2020   - Mild Pulm Htn on echo Jan 2020 - Pulmonary arteries: Systolic pressure was mildly increased. PA    peak pressure: 45 mm Hg  -0> [facility] April 2021 - did not meet tyvaso criteria  -Anxiety/depression  -Lung cancer status post radiation and expectant follow-up - DIAGNOSIS: Stage IIIA (T3, N2, M0) non-small cell lung cancer, poorly differentiated squamous cell carcinoma diagnosed in February 2017 with PDL 1 expression of 35%, presented with large left lower lobe lung mass in addition to left upper lobe lung nodule and mediastinal lymphadenopathy. PRIOR THERAPY: Course of concurrent  chemoradiation with weekly carboplatin for AUC of 2 and paclitaxel 45 MG/M2. Status post 7 cycles, last dose was given 11/19/2015 with partial response.  CURRENT THERAPY: Observation.   -T6 lesion followed up with a bone scan in October 2020  HPI Jason Summers 86 y.o. -presents for follow-up with his wife.  I personally last saw him in January 2021 after  that he saw the nurse practitioner.  According to him and his wife he continues to have progressive shortness of breath.  However objectively with symptom structured questioning the shortness of breath is the same.  When I pointed this out to the wife she says she does not know how to answer these questions.  Objectively also while walking him for walking desaturation test he does desaturate but it also seems similar to last visit.  Nevertheless he is getting more short of breath his quality of life is diminished.  He is not able to go shopping with her.  I explained to them again the multifactorial nature of the dyspnea.  We did subject him to a right heart catheterization in April 2021.  However the pressures [documented below] did not meet treprostinil prescription criteria.  He continues to have his hoarse voice.  He does not think it is worse [I think it is worse].  His ENT surgeon Dr. Erik Obey has retired.  He is willing to see Dr. Constance Holster who is the neighbor to his son.  Wife is also complaining of stuffiness of his head.  Patient does not use oxygen because he says it does not help him.  I remember that his overnight oxygen desaturation test was abnormal.  Today walking desaturation test he did not qualify for oxygen.  He continues to have anxiety and depression based on symptom questionnaire.  He is not on any medication for this.  In terms of his emphysema: He does not think the anticholinergic inhaler Incruse has helped him.  He is on daily prednisone.  He and his wife are extremely frustrated by his poor quality of life.  He does not want to do pulmonary rehabilitation.  In April 2020 when he did have liver function test this was normal.  He also had CT scan of the chest that shows the cancer to be in remission.   SYMPTOM SCALE - ILD 08/12/2018 Weight 165# 12/09/2018 165# 06/30/2019  173# weight 10/25/2019 175#  O2 use ra ra RA - not done   Shortness of Breath 0 -> 5 scale with 5 being worst (score 6  If unable to do)     At rest _0 Simple tasks - showers, clothes change, eating, shaving _1 Walking level at own pace _2 Walking up Stairs _3 Total (40 - 48) Dyspnea Score _4 How bad is your cough? 0   0  How bad is your fatigue 4   4  nause    0  vomiut    0  diarrhea    0  anxiety    3  depressin    3       Simple office walk 185 feet x  3 laps goal with forehead probe 10/28/2017  06/15/2018 [place] - 250 feet x 3 laps 06/25/2018 Weight 171# 08/12/2018 ofev x 4-6 weeks. Unable to do PFT - weight 165# 12/09/2018 Weight -  01/26/2019 Wt 168# 03/28/2019 weigit - 173# 06/30/2019  10/25/2019   O2  used Room  _0  rooom air Room air ra  Number laps completed _1 las x 25o feet _2 Comments about pace Good pace normal normal normal normal    Very slow pace  Resting Pulse Ox/HR 100% and 83/min 97% and 87/min 98% and 96/min 99% and 83/min 99% and 96/min 99% 99% and 87/min 98and HR 92 99% and 94  Final Pulse Ox/HR 97% and 96/min 91% and 101/mikn 90% and 110/min 91% and 102/min 92% and 116/min 90% and 117/min 92% and 99/min 90% and HR 107 90% and HR 110  Desaturated </= 88% _3   no no no  Desaturated <= 3% points yds Yes, 6 points Yes, 8 points Yes, 8 points Yes, 7 points  Yes, 7 points Yes, 8 poitns Yes, 9 points  Got Tachycardic >/= 90/min _4   yes    Symptoms at end of test x Moderate dyspnea Mild dysnea Mild dyspnea Very dyspneic at last lap  Usual dyspnea Mild dyspneia   Miscellaneous comments x ? worse  Not using night o2 Not using o2  stable Hoarse voice worse with more walking       Right Heart Pressures 10/06/19 RA (mean): 2 mmHg RV (S/EDP): 36/1 mmHg PA (S/D, mean): 32/9 (19) mmHg - does not meet criteria of >/= 25 based on increase study PCWP (mean): 8 mmHg  SpO2: 98% PA sat: 70%  Fick CO: 4.3 L/min Fick CI: 2.3 L/min/m^2  PVR: 3.9 Wood units    CT chest  angio4/13/21 IMPRESSION: 1. No demonstrable pulmonary embolus. No thoracic aortic aneurysm. There is aortic atherosclerosis as well as foci of coronary artery calcification.  2. Underlying emphysematous change. Stable hydropneumothorax on the left. Underlying areas of fibrosis and bronchiectatic change. Airspace opacity in the left lower lobe is stable and likely represents chronic scarring and atelectatic change. No new mass or infiltrative appearing lesion in the lungs evident.  3.  Stable enlarged pericarinal lymph node.  No new adenopathy.  4.  Hiatal hernia.  5. Stable wedge compression fracture at T6. Neoplastic etiology questioned.  Aortic Atherosclerosis (ICD10-I70.0) and Emphysema (ICD10-J43.9).   Electronically Signed   By: Lowella Grip III M.D.   On: 09/20/2019 13:57   ROS - per HPI     has a past medical history of Acute rhinitis (08/10/2017), Acute stress disorder (10/27/2007), Anxiety (07/14/2018), Arthritis, Bilateral epiphora (09/25/2015), Blush (09/23/2007), Cancer ([place]), Chest pain in adult (12/16/2016), Chronic right-sided thoracic back pain (12/16/2016), Compression fracture of body of thoracic vertebra ([place]) (02/09/2019), Dysphagia (07/17/2010), Eustachian tube dysfunction, bilateral (08/10/2017), Ganglion of joint (04/22/2007), Gastroesophageal reflux disease (05/15/2016), Headache (01/30/2016), Hemorrhoids, HEMORRHOIDS, INTERNAL (02/18/2007), IPF (idiopathic pulmonary fibrosis) ([place]) (07/14/2018), Lateral epicondylitis (tennis elbow), Lingular mass (09/07/2015), Mass of lingula of lung (09/07/2015), Mass of lower lobe of left lung (09/07/2015), Mediastinal adenopathy (09/07/2015), Mediastinal lymphadenopathy (08/27/2015), Multiple pulmonary nodules (08/27/2015), Nausea and vomiting (05/15/2016), Non-small cell cancer of left lung ([place]) (09/24/2015), Non-small cell lung cancer (NSCLC) ([place]) (dx'f 07/30/15), Nuclear sclerosis of right eye (09/25/2015), Odynophagia (11/06/2015),  Osteoarthritis (07/17/2010), Persistent headaches (02/03/2017), Pleural effusion on left (08/14/2016), Pseudophakia, left eye (09/25/2015), Pulmonary emphysema ([place]) (08/14/2016), Radiation fibrosis of lung ([place]) (08/14/2016), Shortness of breath (08/14/2016), Soft tissue lesion of shoulder region (04/22/2007), Spondylosis of cervical joint, and SPRAIN AND STRAIN OF SACROILIAC (09/06/2008).   reports that he quit smoking about 8 years ago. His smoking use  included cigarettes. He has a 12.40 pack-year smoking history. He has never used smokeless tobacco.  Past Surgical History:  Procedure Laterality Date  . COLONOSCOPY  2003  . KNEE ARTHROSCOPY  2006   right  . RIGHT HEART CATH N/A 10/06/2019   Procedure: RIGHT HEART CATH;  Surgeon: Nelva Bush, MD;  Location: [facility];  Service: Cardiovascular;  Laterality: N/A;    Allergies  Allergen Reactions  . Gentamicin Other (See Comments)    Made eyes red, swollen, hot feeling  . Loteprednol-Tobramycin Other (See Comments)    Other reaction(s): Other (See Comments) Made eyes Red, Swollen, Red, Warm feeling  Other reaction(s): Other (See Comments) Made eyes Red, Swollen, Red, Warm feeling  Made eyes Red, Swollen, Red, Warm feeling     Immunization History  Administered Date(s) Administered  . Fluad Quad(high Dose 65+) 02/16/2019  . Influenza Split 03/19/2011, 03/12/2012  . Influenza Whole 06/09/1997, 04/22/2007, 03/14/2008, 03/28/2009, 05/09/2010  . Influenza, High Dose Seasonal PF 04/14/2013, 04/11/2015, 03/02/2017, 03/23/2018  . Influenza,inj,Quad PF,6+ Mos 05/07/2016  . Influenza-Unspecified 04/10/2014  . PFIZER SARS-COV-2 Vaccination 07/01/2019, 07/23/2019  . Pneumococcal Conjugate-13 10/14/2013  . Pneumococcal Polysaccharide-23 06/09/2004, 10/04/2012  . Td 06/09/2005  . Zoster 02/04/2015    Family History  Problem Relation Age of Onset  . Hypertension Mother   . Heart disease Mother   . Coronary artery disease Other       Current Outpatient Medications:  .  ALPRAZolam (XANAX) 0.25 MG tablet, Take 1 tablet (0.25 mg total) by mouth 2 (two) times daily as needed for anxiety., Disp: 45 tablet, Rfl: 0 .  calcium carbonate (TUMS - DOSED IN MG ELEMENTAL CALCIUM) 500 MG chewable tablet, Chew 1 tablet by mouth 2 (two) times daily as needed for heartburn. , Disp: , Rfl:  .  cyclobenzaprine (FLEXERIL) 10 MG tablet, Take 1 tablet (10 mg total) by mouth 3 (three) times daily as needed for muscle spasms., Disp: 15 tablet, Rfl: 0 .  esomeprazole (NEXIUM) 40 MG capsule, Take 1 capsule (40 mg total) by mouth daily., Disp: 90 capsule, Rfl: 3 .  glucosamine-chondroitin 500-400 MG tablet, Take 1 tablet by mouth daily. , Disp: , Rfl:  .  OFEV 150 MG CAPS, Take 1 capsule (150 mg total) by mouth 2 (two) times daily., Disp: 60 capsule, Rfl: 11 .  predniSONE (DELTASONE) 10 MG tablet, TAKE 1 1/2 TABLET BY MOUTH DAILY WITH BREAKFAST, Disp: 44 tablet, Rfl: 3 .  PROAIR HFA 108 (90 Base) MCG/ACT inhaler, INHALE 2 PUFFS BY MOUTH INTO THE LUNGS EVERY 6 HOURS AS NEEDED FOR WHEEZING OR SHORTNESS OF BREATH, Disp: 8.5 g, Rfl: 5 .  rosuvastatin (CRESTOR) 5 MG tablet, TAKE 1 TABLET(5 MG) BY MOUTH DAILY, Disp: 90 tablet, Rfl: 1 .  tamsulosin (FLOMAX) 0.4 MG CAPS capsule, Take 1 capsule (0.4 mg total) by mouth daily., Disp: 90 capsule, Rfl: 3 .  umeclidinium bromide (INCRUSE ELLIPTA) 62.5 MCG/INH AEPB, Inhale 1 puff into the lungs daily., Disp: 90 each, Rfl: 3  Current Facility-Administered Medications:  .  sodium chloride flush (NS) 0.9 % injection 3 mL, 3 mL, Intravenous, Q12H, Nishan, Peter C, MD .  sodium chloride flush (NS) 0.9 % injection 3 mL, 3 mL, Intravenous, Q12H, Josue Hector, MD      Objective:   Vitals:   10/25/19 0923  BP: 116/62  Pulse: (!) 101  SpO2: 95%  Weight: 175 lb 3.2 oz (79.5 kg)  Height: _0  (1.676 m)    Estimated body  mass index is 28.28 kg/m as calculated from the following:   Height as of this  encounter: 5' 6" (1.676 m).   Weight as of this encounter: 175 lb 3.2 oz (79.5 kg).  _0 @  Filed Weights   10/25/19 0923  Weight: 175 lb 3.2 oz (79.5 kg)     Physical Exam alert and oriented x3.  Flat affect male.  Significant hoarse voice.  Mild bilateral bibasal crackles.  Normal heart sounds.  Abdomen soft.    Assessment:       ICD-10-CM   1. IPF (idiopathic pulmonary fibrosis) ([place])  J84.112   2. Therapeutic drug monitoring  Z51.81   3. Pulmonary emphysema, unspecified emphysema type ([place])  J43.9   4. Non-small cell cancer of left lung (HCC)  C34.92   5. Anxiety  F41.9   6. Hoarseness of voice  R49.0   7. Vocal cord paralysis  J38.00   8. Stuffy head  R09.81   9. Shortness of breath  R06.02        Plan:     Patient Instructions     ICD-10-CM   1. IPF (idiopathic pulmonary fibrosis) ([place])  J84.112   2. Therapeutic drug monitoring  Z51.81   3. Pulmonary emphysema, unspecified emphysema type ([place])  J43.9   4. Non-small cell cancer of left lung (HCC)  C34.92   5. Anxiety  F41.9   6. Hoarseness of voice  R49.0   7. Vocal cord paralysis  J38.00   8. Stuffy head  R09.81   9. Shortness of breath  R06.02      IPF (idiopathic pulmonary fibrosis) (HCC) with out of proportion shortness of breath  - clinically stable as of today but this is a progressive disease and over time can become major reason for shortness of breath. The ofev Is helping you by trying to prevent progression   - LFT normal Mid April 2021 - Right heart cath April 2021 did not show elevation of PA pressures such that tyvaso would be of benefit  plan - continue ofev as before - check LFT in 3 months from this visit - no role for Tyvaso   Pulmonary emphysema, unspecified emphysema type (HCC) - stable  - stable without flare up but inhalers not helping   Plan: -.Can reduce prednisone to 50m per day  Albuterol as needed - stop incruse due to lack of help   Abnormal computed  tomography of thoracic spine - new  Aug 2020 [place]   - in remission per CT scan April 2021  Plan  - per oncology  Vocal cord paralysis Hoarse voice Stuffy head   - this seems worse on exam esp hoarse voice  Plan  - refer Dr RConstance Holstersince Dr WErik Obeyretired  Anxiety/Dpression   - this is a major issue and needs control to help quality of life  Plan  - d/w PCP Nafziger, CTommi Rumps NP - to get started on a sNRI/SSRI (I have sent a messagE)  Shortness of breath   - due to all of the above reasons    Plan  - recommend rehab  -btu respect youy declined  - currently do not qualify for o2 - work on vocal cord isssue and anxiety   Followup  3 months for ILD clinic followup - 30 min slot      ( Level 05 visit: Estb 40-54 min   in  visit type: on-site physical face to visit  in total care  time and counseling or/and coordination of care by this undersigned MD - Dr Brand Males. This includes one or more of the following on this same day 10/25/2019: pre-charting, chart review, note writing, documentation discussion of test results, diagnostic or treatment recommendations, prognosis, risks and benefits of management options, instructions, education, compliance or risk-factor reduction. It excludes time spent by the [facility] or office staff in the care of the patient. Actual time 51 min)    SIGNATURE    Dr. Brand Males, M.D., F.C.C.P,  Pulmonary and Critical Care Medicine Staff Physician, [place] Director - Interstitial Lung Disease  Program  [facility] [place], [place], [address]  Pager: [phone], If no answer or between  15:00h - 7:00h: call [phone] Telephone: [phone]  10:08 AM 10/25/2019

## 2019-11-08 ENCOUNTER — Telehealth: Payer: Self-pay | Admitting: Adult Health

## 2019-11-08 NOTE — Telephone Encounter (Signed)
Celexa can cause diarrhea but this usually resolves within 2 to 3 weeks of taking it.  Have him try cutting the dose in half and taking 5 mg for the next week and see if that helps.  If no improvement then please let us know

## 2019-11-08 NOTE — Telephone Encounter (Signed)
[name],Laura Jansson, stated the citalopram is causing the pt to have diarrhea and no appetite.Mickel Baas  Is wondering if he should stop the medication or what PCP recommends   Pt can be reached at [phone]

## 2019-11-09 NOTE — Telephone Encounter (Signed)
LMOM FOR A RETURN CALL

## 2019-11-09 NOTE — Telephone Encounter (Signed)
Patient spouse notified of update  and verbalized understanding.

## 2019-11-09 NOTE — Telephone Encounter (Signed)
[name], Mickel Baas, returning call. Thanks

## 2019-11-25 ENCOUNTER — Other Ambulatory Visit: Payer: Self-pay | Admitting: Otolaryngology

## 2019-11-25 DIAGNOSIS — R1314 Dysphagia, pharyngoesophageal phase: Secondary | ICD-10-CM

## 2019-12-05 ENCOUNTER — Telehealth: Payer: Self-pay | Admitting: Pharmacist

## 2019-12-05 NOTE — Telephone Encounter (Signed)
Called patient and spoke with wife Mickel Baas.  Advised that we received a fax from [facility] stating they were unable to reach patient to schedule next shipment.  She states patient is still tolerating medication well and they call their refills in when they are running low.    While on the phone she looked at current supply and discovered they do need a refill.  She will call the pharmacy to set up shipment.  Encouraged patient and wife to call in refill when they have about 12-10 days left of medication in order to prevent any delays.  Wife verbalized understanding.  All questions encouraged and answered.   Mariella Saa, PharmD, [place], CPP Clinical Specialty Pharmacist (Rheumatology and Pulmonology)  12/05/2019 4:44 PM

## 2019-12-13 ENCOUNTER — Telehealth: Payer: Self-pay | Admitting: Internal Medicine

## 2019-12-13 NOTE — Telephone Encounter (Signed)
ATC. Unable to leave message. Need to clarify Incruse use. Per ov Dr Chase Caller ov note on 10/25/19 pt was to stop Incruse due to lack of help.

## 2019-12-14 ENCOUNTER — Ambulatory Visit
Admission: RE | Admit: 2019-12-14 | Discharge: 2019-12-14 | Disposition: A | Discharge status: Home / Self Care | Payer: Medicare Other | Source: Ambulatory Visit | Attending: Otolaryngology | Admitting: Otolaryngology

## 2019-12-14 DIAGNOSIS — R1314 Dysphagia, pharyngoesophageal phase: Secondary | ICD-10-CM

## 2019-12-15 NOTE — Telephone Encounter (Signed)
Spoke with the pt's spouse  She states that pt did not know he was supposed to stop incruse but he will stop now bc he still feels that it's not helping I scheduled appt for August ILD f/u  Nothing further needed at this time

## 2019-12-19 ENCOUNTER — Other Ambulatory Visit: Payer: Self-pay | Admitting: Adult Health

## 2019-12-19 DIAGNOSIS — F419 Anxiety disorder, unspecified: Secondary | ICD-10-CM

## 2019-12-20 NOTE — Telephone Encounter (Signed)
Pt scheduled for follow up on 12/21/19

## 2019-12-21 ENCOUNTER — Telehealth (INDEPENDENT_AMBULATORY_CARE_PROVIDER_SITE_OTHER): Payer: Medicare Other | Admitting: Adult Health

## 2019-12-21 ENCOUNTER — Encounter: Payer: Self-pay | Admitting: Adult Health

## 2019-12-21 ENCOUNTER — Other Ambulatory Visit: Payer: Self-pay

## 2019-12-21 VITALS — Wt 166.0 lb

## 2019-12-21 DIAGNOSIS — K219 Gastro-esophageal reflux disease without esophagitis: Secondary | ICD-10-CM | POA: Diagnosis not present

## 2019-12-21 DIAGNOSIS — F329 Major depressive disorder, single episode, unspecified: Secondary | ICD-10-CM | POA: Diagnosis not present

## 2019-12-21 DIAGNOSIS — F419 Anxiety disorder, unspecified: Secondary | ICD-10-CM

## 2019-12-21 DIAGNOSIS — F32A Depression, unspecified: Secondary | ICD-10-CM

## 2019-12-21 MED ORDER — ESOMEPRAZOLE MAGNESIUM 40 MG PO CPDR: 40.0000 mg | DELAYED_RELEASE_CAPSULE | Freq: Two times a day (BID) | ORAL | 1 refills | Status: AC

## 2019-12-21 MED ORDER — CITALOPRAM HYDROBROMIDE 10 MG PO TABS: 10.0000 mg | ORAL_TABLET | Freq: Every day | ORAL | 1 refills | Status: AC

## 2019-12-21 NOTE — Progress Notes (Signed)
Virtual Visit via Telephone Note  I connected with Jason Summers on 12/21/19 at  4:00 PM EDT by telephone and verified that I am speaking with the correct person using two identifiers.   I discussed the limitations, risks, security and privacy concerns of performing an evaluation and management service by telephone and the availability of in person appointments. I also discussed with the patient that there may be a patient responsible charge related to this service. The patient expressed understanding and agreed to proceed.  Location patient: home Location provider: work or home office Participants present for the call: patient, provider, wife Patient did not have a visit in the prior 7 days to address this/these issue(s).   History of Present Illness: 86 year old male who is being evaluated today for follow-up regarding anxiety and depression.  On Oct 25, 2019 he was started on Celexa 10 mg, at the beginning he did have some GI upset and was advised to cut back to 5 mg.  Since then his GI issues have resolved and he is back to taking a 10 mg dose daily.  Since starting this medication he does feel more relaxed and less "on edge".  He is sleeping a little bit more throughout the day but overall he feels improvement in his anxiety and depression.  He does not feel as though he needs to take an increased dose of Celexa at this time.  Additionally, he needs a refill of Nexium.   Observations/Objective: Patient sounds cheerful and well on the phone. I do not appreciate any SOB. Speech and thought processing are grossly intact. Patient reported vitals:  Assessment and Plan: 1. Anxiety and depression  - citalopram (CELEXA) 10 MG tablet; Take 1 tablet (10 mg total) by mouth daily.  Dispense: 90 tablet; Refill: 1  2. Gastroesophageal reflux disease without esophagitis  - esomeprazole (NEXIUM) 40 MG capsule; Take 1 capsule (40 mg total) by mouth 2 (two) times daily before a meal.  Dispense: 180  capsule; Refill: 1   Follow Up Instructions: I did not refer this patient for an OV in the next 24 hours for this/these issue(s).  I discussed the assessment and treatment plan with the patient. The patient was provided an opportunity to ask questions and all were answered. The patient agreed with the plan and demonstrated an understanding of the instructions.   The patient was advised to call back or seek an in-person evaluation if the symptoms worsen or if the condition fails to improve as anticipated.  I provided 15  minutes of non-face-to-face time during this encounter.   Jason Peng, NP

## 2019-12-26 ENCOUNTER — Telehealth: Payer: Self-pay | Admitting: Adult Health

## 2019-12-26 NOTE — Telephone Encounter (Signed)
Pt is calling in stating that he needs a refill citalopram (CELEXA) 10 MG  Pharm:  [facility]

## 2019-12-27 NOTE — Telephone Encounter (Signed)
Sent to [facility] on 12/21/19

## 2019-12-28 ENCOUNTER — Inpatient Hospital Stay (HOSPITAL_COMMUNITY): Payer: Medicare Other

## 2019-12-28 ENCOUNTER — Encounter (HOSPITAL_COMMUNITY): Payer: Self-pay

## 2019-12-28 ENCOUNTER — Other Ambulatory Visit: Payer: Self-pay

## 2019-12-28 ENCOUNTER — Emergency Department (HOSPITAL_COMMUNITY): Payer: Medicare Other

## 2019-12-28 ENCOUNTER — Inpatient Hospital Stay (HOSPITAL_COMMUNITY)
Admission: EM | Admit: 2019-12-28 | Discharge: 2020-01-04 | DRG: 871 | Disposition: A | Discharge status: Home Health | Payer: Medicare Other | Attending: Family Medicine | Admitting: Family Medicine

## 2019-12-28 DIAGNOSIS — R590 Localized enlarged lymph nodes: Secondary | ICD-10-CM | POA: Diagnosis present

## 2019-12-28 DIAGNOSIS — F419 Anxiety disorder, unspecified: Secondary | ICD-10-CM | POA: Diagnosis present

## 2019-12-28 DIAGNOSIS — R338 Other retention of urine: Secondary | ICD-10-CM | POA: Diagnosis not present

## 2019-12-28 DIAGNOSIS — Z7952 Long term (current) use of systemic steroids: Secondary | ICD-10-CM | POA: Diagnosis not present

## 2019-12-28 DIAGNOSIS — A419 Sepsis, unspecified organism: Secondary | ICD-10-CM | POA: Diagnosis not present

## 2019-12-28 DIAGNOSIS — R339 Retention of urine, unspecified: Secondary | ICD-10-CM | POA: Diagnosis not present

## 2019-12-28 DIAGNOSIS — F329 Major depressive disorder, single episode, unspecified: Secondary | ICD-10-CM | POA: Diagnosis present

## 2019-12-28 DIAGNOSIS — K219 Gastro-esophageal reflux disease without esophagitis: Secondary | ICD-10-CM | POA: Diagnosis present

## 2019-12-28 DIAGNOSIS — Z87891 Personal history of nicotine dependence: Secondary | ICD-10-CM

## 2019-12-28 DIAGNOSIS — Z8249 Family history of ischemic heart disease and other diseases of the circulatory system: Secondary | ICD-10-CM

## 2019-12-28 DIAGNOSIS — Z85118 Personal history of other malignant neoplasm of bronchus and lung: Secondary | ICD-10-CM | POA: Diagnosis not present

## 2019-12-28 DIAGNOSIS — R319 Hematuria, unspecified: Secondary | ICD-10-CM | POA: Diagnosis present

## 2019-12-28 DIAGNOSIS — J84112 Idiopathic pulmonary fibrosis: Secondary | ICD-10-CM

## 2019-12-28 DIAGNOSIS — N200 Calculus of kidney: Secondary | ICD-10-CM | POA: Diagnosis present

## 2019-12-28 DIAGNOSIS — J189 Pneumonia, unspecified organism: Secondary | ICD-10-CM | POA: Diagnosis not present

## 2019-12-28 DIAGNOSIS — E7800 Pure hypercholesterolemia, unspecified: Secondary | ICD-10-CM | POA: Diagnosis present

## 2019-12-28 DIAGNOSIS — R918 Other nonspecific abnormal finding of lung field: Secondary | ICD-10-CM | POA: Diagnosis present

## 2019-12-28 DIAGNOSIS — I248 Other forms of acute ischemic heart disease: Secondary | ICD-10-CM | POA: Diagnosis present

## 2019-12-28 DIAGNOSIS — J439 Emphysema, unspecified: Secondary | ICD-10-CM | POA: Diagnosis present

## 2019-12-28 DIAGNOSIS — J9621 Acute and chronic respiratory failure with hypoxia: Secondary | ICD-10-CM | POA: Diagnosis not present

## 2019-12-28 DIAGNOSIS — Z20822 Contact with and (suspected) exposure to covid-19: Secondary | ICD-10-CM | POA: Diagnosis not present

## 2019-12-28 DIAGNOSIS — Z9221 Personal history of antineoplastic chemotherapy: Secondary | ICD-10-CM | POA: Diagnosis not present

## 2019-12-28 DIAGNOSIS — R34 Anuria and oliguria: Secondary | ICD-10-CM | POA: Diagnosis present

## 2019-12-28 DIAGNOSIS — I272 Pulmonary hypertension, unspecified: Secondary | ICD-10-CM | POA: Diagnosis present

## 2019-12-28 DIAGNOSIS — R310 Gross hematuria: Secondary | ICD-10-CM

## 2019-12-28 DIAGNOSIS — C3492 Malignant neoplasm of unspecified part of left bronchus or lung: Secondary | ICD-10-CM | POA: Diagnosis present

## 2019-12-28 DIAGNOSIS — E872 Acidosis: Secondary | ICD-10-CM | POA: Diagnosis present

## 2019-12-28 DIAGNOSIS — Z923 Personal history of irradiation: Secondary | ICD-10-CM | POA: Diagnosis not present

## 2019-12-28 DIAGNOSIS — J9601 Acute respiratory failure with hypoxia: Secondary | ICD-10-CM

## 2019-12-28 DIAGNOSIS — J431 Panlobular emphysema: Secondary | ICD-10-CM | POA: Diagnosis not present

## 2019-12-28 LAB — BRAIN NATRIURETIC PEPTIDE: B Natriuretic Peptide: 141.6 pg/mL — ABNORMAL HIGH (ref 0.0–100.0)

## 2019-12-28 LAB — CBC WITH DIFFERENTIAL/PLATELET
Abs Immature Granulocytes: 0.1 10*3/uL — ABNORMAL HIGH (ref 0.00–0.07)
Basophils Absolute: 0 10*3/uL (ref 0.0–0.1)
Basophils Relative: 0 %
Eosinophils Absolute: 0 10*3/uL (ref 0.0–0.5)
Eosinophils Relative: 0 %
HCT: 44.4 % (ref 39.0–52.0)
Hemoglobin: 14.5 g/dL (ref 13.0–17.0)
Immature Granulocytes: 1 %
Lymphocytes Relative: 5 %
Lymphs Abs: 0.7 10*3/uL (ref 0.7–4.0)
MCH: 29.7 pg (ref 26.0–34.0)
MCHC: 32.7 g/dL (ref 30.0–36.0)
MCV: 91 fL (ref 80.0–100.0)
Monocytes Absolute: 1.1 10*3/uL — ABNORMAL HIGH (ref 0.1–1.0)
Monocytes Relative: 8 %
Neutro Abs: 11.8 10*3/uL — ABNORMAL HIGH (ref 1.7–7.7)
Neutrophils Relative %: 86 %
Platelets: 155 10*3/uL (ref 150–400)
RBC: 4.88 MIL/uL (ref 4.22–5.81)
RDW: 14.2 % (ref 11.5–15.5)
WBC: 13.7 10*3/uL — ABNORMAL HIGH (ref 4.0–10.5)
nRBC: 0 % (ref 0.0–0.2)

## 2019-12-28 LAB — COMPREHENSIVE METABOLIC PANEL
ALT: 17 U/L (ref 0–44)
AST: 19 U/L (ref 15–41)
Albumin: 3.4 g/dL — ABNORMAL LOW (ref 3.5–5.0)
Alkaline Phosphatase: 58 U/L (ref 38–126)
Anion gap: 10 (ref 5–15)
BUN: 20 mg/dL (ref 8–23)
CO2: 27 mmol/L (ref 22–32)
Calcium: 8.2 mg/dL — ABNORMAL LOW (ref 8.9–10.3)
Chloride: 102 mmol/L (ref 98–111)
Creatinine, Ser: 0.88 mg/dL (ref 0.61–1.24)
GFR calc Af Amer: >60 mL/min (ref >60)
GFR calc non Af Amer: >60 mL/min (ref >60)
Glucose, Bld: 132 mg/dL — ABNORMAL HIGH (ref 70–99)
Potassium: 3.8 mmol/L (ref 3.5–5.1)
Sodium: 139 mmol/L (ref 135–145)
Total Bilirubin: 0.9 mg/dL (ref 0.3–1.2)
Total Protein: 6.6 g/dL (ref 6.5–8.1)

## 2019-12-28 LAB — CBC
HCT: 45.4 % (ref 39.0–52.0)
Hemoglobin: 14.5 g/dL (ref 13.0–17.0)
MCH: 29.6 pg (ref 26.0–34.0)
MCHC: 31.9 g/dL (ref 30.0–36.0)
MCV: 92.7 fL (ref 80.0–100.0)
Platelets: 157 10*3/uL (ref 150–400)
RBC: 4.9 MIL/uL (ref 4.22–5.81)
RDW: 14.3 % (ref 11.5–15.5)
WBC: 13.1 10*3/uL — ABNORMAL HIGH (ref 4.0–10.5)
nRBC: 0 % (ref 0.0–0.2)

## 2019-12-28 LAB — LACTIC ACID, PLASMA
Lactic Acid, Venous: 1.3 mmol/L (ref 0.5–1.9)
Lactic Acid, Venous: 4.6 mmol/L (ref 0.5–1.9)
Lactic Acid, Venous: 5 mmol/L (ref 0.5–1.9)

## 2019-12-28 LAB — SARS CORONAVIRUS 2 BY RT PCR (HOSPITAL ORDER, PERFORMED IN ~~LOC~~ HOSPITAL LAB): SARS Coronavirus 2: NEGATIVE

## 2019-12-28 LAB — TROPONIN I (HIGH SENSITIVITY): Troponin I (High Sensitivity): 34 ng/L — ABNORMAL HIGH (ref <18)

## 2019-12-28 LAB — PROCALCITONIN: Procalcitonin: 0.21 ng/mL

## 2019-12-28 LAB — CREATININE, SERUM
Creatinine, Ser: 1.12 mg/dL (ref 0.61–1.24)
GFR calc Af Amer: >60 mL/min (ref >60)
GFR calc non Af Amer: 59 mL/min — ABNORMAL LOW (ref >60)

## 2019-12-28 LAB — D-DIMER, QUANTITATIVE: D-Dimer, Quant: 2.59 ug/mL-FEU — ABNORMAL HIGH (ref 0.00–0.50)

## 2019-12-28 LAB — MAGNESIUM: Magnesium: 2 mg/dL (ref 1.7–2.4)

## 2019-12-28 LAB — MRSA PCR SCREENING: MRSA by PCR: NEGATIVE

## 2019-12-28 LAB — STREP PNEUMONIAE URINARY ANTIGEN: Strep Pneumo Urinary Antigen: NEGATIVE

## 2019-12-28 MED ORDER — PIPERACILLIN-TAZOBACTAM 3.375 G IVPB
3.3750 g | Freq: Three times a day (TID) | INTRAVENOUS | Status: DC
  Administered 2019-12-28 – 2019-12-29 (×3): 3.375 g via INTRAVENOUS
  Filled 2019-12-28 (×3): qty 50

## 2019-12-28 MED ORDER — SODIUM CHLORIDE 0.9 % IV SOLN: 1.0000 g | Freq: Once | INTRAVENOUS | Status: DC

## 2019-12-28 MED ORDER — UMECLIDINIUM BROMIDE 62.5 MCG/INH IN AEPB
1.0000 | INHALATION_SPRAY | Freq: Every day | RESPIRATORY_TRACT | Status: DC
  Administered 2019-12-29 – 2020-01-04 (×7): 1 via RESPIRATORY_TRACT
  Filled 2019-12-28 (×2): qty 7

## 2019-12-28 MED ORDER — SODIUM CHLORIDE (PF) 0.9 % IJ SOLN
INTRAMUSCULAR | Status: AC
  Filled 2019-12-28: qty 50

## 2019-12-28 MED ORDER — SODIUM CHLORIDE 0.9 % IV SOLN: 1.0000 g | INTRAVENOUS | Status: DC

## 2019-12-28 MED ORDER — DOXYCYCLINE HYCLATE 100 MG PO TABS
100.0000 mg | ORAL_TABLET | Freq: Two times a day (BID) | ORAL | Status: DC
  Administered 2019-12-28 – 2020-01-04 (×15): 100 mg via ORAL
  Filled 2019-12-28 (×15): qty 1

## 2019-12-28 MED ORDER — NINTEDANIB ESYLATE 150 MG PO CAPS
150.0000 mg | ORAL_CAPSULE | Freq: Two times a day (BID) | ORAL | Status: DC
  Administered 2019-12-28 – 2020-01-04 (×14): 150 mg via ORAL
  Filled 2019-12-28 (×2): qty 1

## 2019-12-28 MED ORDER — ALBUTEROL SULFATE (2.5 MG/3ML) 0.083% IN NEBU
2.5000 mg | INHALATION_SOLUTION | RESPIRATORY_TRACT | Status: DC | PRN
  Administered 2020-01-03: 2.5 mg via RESPIRATORY_TRACT
  Filled 2019-12-28: qty 3

## 2019-12-28 MED ORDER — DOXYCYCLINE HYCLATE 100 MG PO TABS: 100.0000 mg | ORAL_TABLET | Freq: Once | ORAL | Status: DC

## 2019-12-28 MED ORDER — CHLORHEXIDINE GLUCONATE CLOTH 2 % EX PADS
6.0000 | MEDICATED_PAD | Freq: Every day | CUTANEOUS | Status: DC
  Administered 2019-12-28 – 2020-01-02 (×6): 6 via TOPICAL

## 2019-12-28 MED ORDER — ORAL CARE MOUTH RINSE
15.0000 mL | Freq: Two times a day (BID) | OROMUCOSAL | Status: DC
  Administered 2019-12-29 – 2020-01-04 (×8): 15 mL via OROMUCOSAL

## 2019-12-28 MED ORDER — SODIUM CHLORIDE 0.9% FLUSH
3.0000 mL | Freq: Two times a day (BID) | INTRAVENOUS | Status: DC
  Administered 2019-12-28 – 2020-01-03 (×14): 3 mL via INTRAVENOUS

## 2019-12-28 MED ORDER — PANTOPRAZOLE SODIUM 40 MG PO TBEC
40.0000 mg | DELAYED_RELEASE_TABLET | Freq: Every day | ORAL | Status: DC
  Administered 2019-12-29 – 2020-01-04 (×7): 40 mg via ORAL
  Filled 2019-12-28 (×7): qty 1

## 2019-12-28 MED ORDER — ENOXAPARIN SODIUM 40 MG/0.4ML ~~LOC~~ SOLN
40.0000 mg | SUBCUTANEOUS | Status: DC
  Administered 2019-12-28: 40 mg via SUBCUTANEOUS
  Filled 2019-12-28 (×2): qty 0.4

## 2019-12-28 MED ORDER — CITALOPRAM HYDROBROMIDE 20 MG PO TABS
10.0000 mg | ORAL_TABLET | Freq: Every day | ORAL | Status: DC
  Administered 2019-12-28 – 2020-01-04 (×8): 10 mg via ORAL
  Filled 2019-12-28 (×8): qty 1

## 2019-12-28 MED ORDER — POLYETHYLENE GLYCOL 3350 17 G PO PACK: 17.0000 g | PACK | Freq: Every day | ORAL | Status: DC | PRN

## 2019-12-28 MED ORDER — ROSUVASTATIN CALCIUM 5 MG PO TABS
5.0000 mg | ORAL_TABLET | Freq: Every day | ORAL | Status: DC
  Administered 2019-12-28 – 2020-01-04 (×8): 5 mg via ORAL
  Filled 2019-12-28 (×8): qty 1

## 2019-12-28 MED ORDER — METOPROLOL TARTRATE 5 MG/5ML IV SOLN: 5.0000 mg | Freq: Four times a day (QID) | INTRAVENOUS | Status: DC | PRN

## 2019-12-28 MED ORDER — IOHEXOL 350 MG/ML SOLN
100.0000 mL | Freq: Once | INTRAVENOUS | Status: AC | PRN
  Administered 2019-12-28: 100 mL via INTRAVENOUS

## 2019-12-28 MED ORDER — SODIUM CHLORIDE 0.9 % IV SOLN: INTRAVENOUS | Status: AC

## 2019-12-28 MED ORDER — PIPERACILLIN-TAZOBACTAM 3.375 G IVPB 30 MIN
3.3750 g | Freq: Once | INTRAVENOUS | Status: AC
  Administered 2019-12-28: 3.375 g via INTRAVENOUS
  Filled 2019-12-28: qty 50

## 2019-12-28 MED ORDER — ALBUTEROL SULFATE HFA 108 (90 BASE) MCG/ACT IN AERS
8.0000 | INHALATION_SPRAY | Freq: Once | RESPIRATORY_TRACT | Status: AC
  Administered 2019-12-28: 8 via RESPIRATORY_TRACT
  Filled 2019-12-28: qty 6.7

## 2019-12-28 MED ORDER — METHYLPREDNISOLONE SODIUM SUCC 125 MG IJ SOLR
125.0000 mg | Freq: Four times a day (QID) | INTRAMUSCULAR | Status: DC
  Administered 2019-12-28 – 2019-12-29 (×5): 125 mg via INTRAVENOUS
  Filled 2019-12-28 (×4): qty 2

## 2019-12-28 MED ORDER — ACETAMINOPHEN 650 MG RE SUPP: 650.0000 mg | Freq: Four times a day (QID) | RECTAL | Status: DC | PRN

## 2019-12-28 MED ORDER — ACETAMINOPHEN 325 MG PO TABS: 650.0000 mg | ORAL_TABLET | Freq: Four times a day (QID) | ORAL | Status: DC | PRN

## 2019-12-28 NOTE — H&P (Addendum)
History and Physical        Hospital Admission Note Date: 12/28/2019  Patient name: Jason Summers Medical record number: [id number] Date of birth: 09-Jan-1932 Age: 86 y.o. Gender: male  PCP: [name], NP  Patient coming from: Home Lives with: Wife At baseline, ambulates: Independently  Chief Complaint    Chief Complaint  Patient presents with  . Shortness of Breath      HPI:   History obtained from his wife at bedside due to patient's conversational dyspnea  This is an 86 year old male with past medical history of NSCLC, IPF on Nintendanib and chronic steroids and follows with Dr. Chase Caller, GERD who presented to [facility] ED on 7/21 with progressively worsening shortness of breath for several weeks with acute decompensation this a.m. and hypoxia at home prompting his wife to call EMS.    This a.m., patient was getting ready to leave his house when he became acutely short of breath and per wife he was cyanotic.  This prompted EMS call who noted that his SPO2 was in the 60s and was placed on 12 L/min O2 and given albuterol x2, Atrovent and 125 mg Solu-Medrol in route. per his wife.  He has had increased productive cough this a.m.  But denies any fever, leg swelling, chest pain, palpitations, nausea, vomiting or any other complaints.  ED Course: SpO2 improved from the 60s and he was titrated to 4 L/min, CXR with RML opacity concerning for pneumonia and stable chronic airspace density in LUL secondary to chronic fibrotic changes and chronic loculated pleural effusion.  BNP minimally elevated at 141, troponin 34, WBC 13.7, COVID-19 negative.  Given ceftriaxone and doxycycline x1 as well as an albuterol treatment.  Vitals:   12/28/19 1015 12/28/19 1030  BP: 106/62 105/74  Pulse: (!) 111 (!) 112  Resp: (!) 26 (!) 36  Temp:    SpO2: 95% 94%     Review of Systems:  Review of Systems   Constitutional: Negative for chills and fever.  HENT: Negative.   Respiratory: Positive for cough, sputum production and shortness of breath. Negative for wheezing.   Cardiovascular: Negative for chest pain, palpitations and leg swelling.  Gastrointestinal: Negative for nausea and vomiting.  Genitourinary: Negative.  Negative for dysuria and urgency.  Musculoskeletal: Negative for falls and myalgias.  Neurological: Negative for dizziness and headaches.  All other systems reviewed and are negative.   Medical/Social/Family History   Past Medical History: Past Medical History:  Diagnosis Date  . Acute rhinitis 08/10/2017  . Acute stress disorder 10/27/2007   Qualifier: Diagnosis of  By: Arnoldo Morale MD, Balinda Quails   Overview:  Overview:  Qualifier: Diagnosis of  By: Arnoldo Morale MD, Balinda Quails  . Anxiety 07/14/2018  . Arthritis   . Bilateral epiphora 09/25/2015  . Blush 09/23/2007   Qualifier: Diagnosis of  By: Arnoldo Morale MD, Balinda Quails   Overview:  Overview:  Qualifier: Diagnosis of  By: Arnoldo Morale MD, [facility])    basal cell on right temple  . Chest pain in adult 12/16/2016  . Chronic right-sided thoracic back pain 12/16/2016  . Compression fracture of body of thoracic vertebra ([place]) 02/09/2019  . Dysphagia 07/17/2010   Qualifier: Diagnosis of  By: Shane Crutch, Amy S   . Eustachian tube dysfunction, bilateral 08/10/2017  . Ganglion of joint 04/22/2007   Qualifier: Diagnosis of  By: Arnoldo Morale MD, Balinda Quails   . Gastroesophageal reflux disease 05/15/2016  . Headache 01/30/2016  . Hemorrhoids   . HEMORRHOIDS, INTERNAL 02/18/2007   Qualifier: Diagnosis of  By: Arnoldo Morale MD, Balinda Quails   . IPF (idiopathic pulmonary fibrosis) ([place]) 07/14/2018   06/22/2018-CT chest with contrast- spectrum of findings suggestive of basilar predominant fibrotic interstitial lung disease with mild honeycombing asymmetrically involving the right lung with significant progression since February/2019 in September/2019 chest CT.  Findings consistent  with UIP.  06/22/2018-echocardiogram- LV ejection fraction 55 to 06%, grade 1 diastolic dysfunction, PA P pressure 4  . Lateral epicondylitis (tennis elbow)   . Lingular mass 09/07/2015  . Mass of lingula of lung 09/07/2015  . Mass of lower lobe of left lung 09/07/2015  . Mediastinal adenopathy 09/07/2015  . Mediastinal lymphadenopathy 08/27/2015  . Multiple pulmonary nodules 08/27/2015  . Nausea and vomiting 05/15/2016  . Non-small cell cancer of left lung ([place]) 09/24/2015   09/13/2015: Left lower lobe lung biopsy: Overall, the histologic features, in conjunction with the positive staining for cytokeratin 903, cytokeratin 5/6, and p63 support a diagnosis of poorly differentiated squamous cell carcinoma. There is likely sufficient tumor remaining for additional studies, if requested. (JBK:ds 09/17/15)     . Non-small cell lung cancer (NSCLC) ([place]) dx'f 07/30/15  . Nuclear sclerosis of right eye 09/25/2015  . Odynophagia 11/06/2015  . Osteoarthritis 07/17/2010   Qualifier: Diagnosis of  By: Trellis Paganini PA-c, Amy S   . Persistent headaches 02/03/2017  . Pleural effusion on left 08/14/2016  . Pseudophakia, left eye 09/25/2015  . Pulmonary emphysema ([place]) 08/14/2016  . Radiation fibrosis of lung ([place]) 08/14/2016  . Shortness of breath 08/14/2016  . Soft tissue lesion of shoulder region 04/22/2007   Qualifier: Diagnosis of  By: Arnoldo Morale MD, Balinda Quails   Overview:  Overview:  Qualifier: Diagnosis of  By: Arnoldo Morale MD, Balinda Quails  . Spondylosis of cervical joint   . SPRAIN AND STRAIN OF SACROILIAC 09/06/2008   Qualifier: Diagnosis of  By: Arnoldo Morale MD, Balinda Quails     Past Surgical History:  Procedure Laterality Date  . COLONOSCOPY  2003  . KNEE ARTHROSCOPY  2006   right  . RIGHT HEART CATH N/A 10/06/2019   Procedure: RIGHT HEART CATH;  Surgeon: Nelva Bush, MD;  Location: [place] [facility];  Service: Cardiovascular;  Laterality: N/A;    Medications: Prior to Admission medications   Medication Sig Start Date End Date Taking?  Authorizing Provider  calcium carbonate (TUMS - DOSED IN MG ELEMENTAL CALCIUM) 500 MG chewable tablet Chew 1 tablet by mouth 2 (two) times daily as needed for heartburn.    Yes [provider]  citalopram (CELEXA) 10 MG tablet Take 1 tablet (10 mg total) by mouth daily. 12/21/19  Yes Nafziger, Tommi Rumps, NP  esomeprazole (NEXIUM) 40 MG capsule Take 1 capsule (40 mg total) by mouth 2 (two) times daily before a meal. 12/21/19 03/20/20 Yes Nafziger, Tommi Rumps, NP  glucosamine-chondroitin 500-400 MG tablet Take 1 tablet by mouth daily.    Yes [provider]  OFEV 150 MG CAPS Take 1 capsule (150 mg total) by mouth 2 (two) times daily. 07/14/19  Yes Brand Males, MD  predniSONE (DELTASONE) 10 MG tablet TAKE 1 1/2 TABLET BY MOUTH DAILY WITH BREAKFAST Patient taking differently: Take 15 mg by mouth daily with breakfast.  08/10/19  Yes Mack, Vinson Moselle, NP  PROAIR HFA 108 (90 Base) MCG/ACT inhaler INHALE 2 PUFFS BY MOUTH INTO THE LUNGS EVERY 6 HOURS AS NEEDED FOR WHEEZING OR SHORTNESS OF BREATH Patient taking differently: Inhale 2 puffs into the lungs every 6 (six) hours as needed for wheezing or shortness of breath.  08/04/19  Yes Ramaswamy, Belva Crome, MD  rosuvastatin (CRESTOR) 5 MG tablet TAKE 1 TABLET(5 MG) BY MOUTH DAILY Patient taking differently: Take 5 mg by mouth daily.  09/15/19  Yes Nafziger, Tommi Rumps, NP  umeclidinium bromide (INCRUSE ELLIPTA) 62.5 MCG/INH AEPB Inhale 1 puff into the lungs daily. 07/12/19  Yes Brand Males, MD    Allergies:   Allergies  Allergen Reactions  . Gentamicin Other (See Comments)    Made eyes red, swollen, hot feeling  . Loteprednol-Tobramycin Other (See Comments)    Other reaction(s): Other (See Comments) Made eyes Red, Swollen, Red, Warm feeling  Other reaction(s): Other (See Comments) Made eyes Red, Swollen, Red, Warm feeling  Made eyes Red, Swollen, Red, Warm feeling     Social History:  reports that he quit smoking about 8 years ago. His smoking use  included cigarettes. He has a 12.40 pack-year smoking history. He has never used smokeless tobacco. He reports current alcohol use of about 7.0 standard drinks of alcohol per week. He reports that he does not use drugs.  Family History: Family History  Problem Relation Age of Onset  . Hypertension Mother   . Heart disease Mother   . Coronary artery disease Other      Objective   Physical Exam: Blood pressure 105/74, pulse (!) 112, temperature 98.3 F (36.8 C), temperature source Oral, resp. rate (!) 36, SpO2 94 %.  Physical Exam Vitals and nursing note reviewed.  Constitutional:      Appearance: Normal appearance. He is ill-appearing. He is not diaphoretic.  HENT:     Head: Normocephalic and atraumatic.  Eyes:     Conjunctiva/sclera: Conjunctivae normal.  Cardiovascular:     Rate and Rhythm: Tachycardia present.     Heart sounds: No murmur heard.      Comments: Bedside telemetry appears to have irregular rhythm Pulmonary:     Effort: Tachypnea present.     Comments: Conversational dyspnea Right lung end inspiratory wheeze Abdominal:     General: Abdomen is flat.     Palpations: Abdomen is soft.  Musculoskeletal:        General: No swelling or tenderness.     Right lower leg: No edema.     Left lower leg: No edema.  Skin:    Coloration: Skin is not jaundiced or pale.  Neurological:     Mental Status: He is alert. Mental status is at baseline.  Psychiatric:        Mood and Affect: Mood normal.        Behavior: Behavior normal.     LABS on Admission: I have personally reviewed all the labs and imaging below    Basic Metabolic Panel: Recent Labs  Lab 12/28/19 1031  NA 139  K 3.8  CL 102  CO2 27  GLUCOSE 132*  BUN 20  CREATININE 0.88  CALCIUM 8.2*   Liver Function Tests: Recent Labs  Lab 12/28/19 1031  AST 19  ALT 17  ALKPHOS 58  BILITOT 0.9  PROT 6.6  ALBUMIN 3.4*   No results for input(s): LIPASE, AMYLASE in the last 168 hours. No results for  input(s): AMMONIA in the last 168 hours. CBC:  Recent Labs  Lab 12/28/19 1031  WBC 13.7*  NEUTROABS 11.8*  HGB 14.5  HCT 44.4  MCV 91.0  PLT 155   Cardiac Enzymes: No results for input(s): CKTOTAL, CKMB, CKMBINDEX, TROPONINI in the last 168 hours. BNP: Invalid input(s): POCBNP CBG: No results for input(s): GLUCAP in the last 168 hours.  Radiological Exams on Admission:  DG Chest Port 1 View  Result Date: 12/28/2019 CLINICAL DATA:  Worsening shortness of breath. History of pulmonary fibrosis. EXAM: PORTABLE CHEST 1 VIEW COMPARISON:  09/19/2019 FINDINGS: The heart size appears normal. Chronic loculated left pleural effusion overlying the posterolateral left lower lobe is identified. Extensive, bilateral changes of pulmonary fibrosis identified. Similar appearance of focal, chronic airspace density within the left upper lobe. New hazy opacity is identified within the periphery of the right midlung which may reflect superimposed pneumonia. IMPRESSION: 1. New hazy opacity within the periphery of the right midlung which may reflect superimposed pneumonia. 2. Stable chronic airspace density within the left upper lobe secondary to chronic fibrotic changes. 3. Chronic loculated left pleural effusion. Electronically Signed   By: Kerby Moors M.D.   On: 12/28/2019 10:46      EKG: Ordered, not done yet   A & P   Active Problems:   Acute hypoxemic respiratory failure (HCC)   1. Sepsis without shock  Acute hypoxic respiratory failure suspect secondary to IPF exacerbation and CAP a. Afebrile, tachycardic, tachypneic, leukocytosis (13.7), hypoxic initially requiring 12 L/min nasal cannula now on 4 L/min with questionable RML consolidation on CXR and lactic acidosis of 5 likely due to dehydration b. Continue doxycycline (holding azithromycin for now as unsure QT interval) c. Change ceftriaxone to Zosyn for Pseudomonas coverage in patient with IPF d. Continue Solu-Medrol e. MRSA  nares f. Holding procalcitonin as he is on chronic steroids at home g. Admit to stepdown unit h. PCCM consulted  2. Tachycardia with elevated troponin a. Troponin (34) likely from demand ischemia b. Irregular rhythm on telemetry concerning for possible A. fib without known history - check EKG and magnesium c. Continue telemetry d. Lopressor as needed for HR> 110  3. History of IPF a. Follows with Dr. Chase Caller b. On nintendanib as an outpatient, on hold pending PCCM recommendations   DVT prophylaxis: Lovenox   Code Status: Prior Diet: Heart healthy Family Communication: Admission, patients condition and plan of care including tests being ordered have been discussed with the patient who indicates understanding and agrees with the plan and Code Status. Patient's wife was updated  Disposition Plan: The appropriate patient status for this patient is INPATIENT. Inpatient status is judged to be reasonable and necessary in order to provide the required intensity of service to ensure the patient's safety. The patient's presenting symptoms, physical exam findings, and initial radiographic and laboratory data in the context of their chronic comorbidities is felt to place them at high risk for further clinical deterioration. Furthermore, it is not anticipated that the patient will be medically stable for discharge from the hospital within 2 midnights of admission. The following factors support the patient status of inpatient.   " The patient's presenting symptoms include dyspnea. " The worrisome physical exam findings include hypoxia and tachycardia. " The initial radiographic and laboratory data are worrisome because of consolidation on chest x-ray, leukocytosis. " The chronic co-morbidities include idiopathic pulmonary fibrosis.   * I certify that at the point of admission it is my clinical judgment that the patient will require inpatient hospital care spanning beyond 2 midnights  from the point of  admission due to high intensity of service, high risk for further deterioration and high frequency of surveillance required.*   Status is: Inpatient  Remains inpatient appropriate because:IV treatments appropriate due to intensity of illness or inability to take PO and Inpatient level of care appropriate due to severity of illness   Dispo: The patient is from: Home              Anticipated d/c is to: TBD              Anticipated d/c date is: > 3 days              Patient currently is not medically stable to d/c.        The medical decision making on this patient was of high complexity and the patient is at high risk for clinical deterioration, therefore this is a level 3 admission.  Consultants  . PCCM  Procedures  . None  Time Spent on Admission: 75 minutes    Harold Hedge, DO [facility] Pager [phone] 12/28/2019, 12:11 PM

## 2019-12-28 NOTE — Progress Notes (Addendum)
Notified regarding elevated D dimer and lactic acid of 5.0.  - Will check CTA chest to check for PE - Afebrile, may be elevated from albuterol treatment vs. sepsis. Will start IV fluids for now and repeat lactic acid level  Marva Panda, DO

## 2019-12-28 NOTE — ED Provider Notes (Signed)
[facility] DEPT Provider Note   CSN: [id number] Arrival date & time: 12/28/19  3151     History Chief Complaint  Patient presents with  . Shortness of Breath    Jason Summers is a 86 y.o. male.  86 year old male with past medical history below including idiopathic pulmonary fibrosis, GERD, NSCLC who presents with shortness of breath.  Patient has had progressively worsening shortness of breath over the past few weeks.  He denies any associated cough or fever.  This morning he got ready to go to the doctor but became so short of breath that they could not make it and had to call EMS.  Wife notes that he was severely dyspneic and cyanotic prior to EMS arrival.  EMS notes that his O2 saturations were in the 60s on first arrival, he was placed on supplemental oxygen and given albuterol x2, atrovent, and 112m solumedrol in route.  He reports that these medications have improved his breathing.  He denies any chest pain, leg swelling/pain, or other complaints.  He is compliant with his medications and follows with pulmonology.  The history is provided by the patient and the spouse.  Shortness of Breath      Past Medical History:  Diagnosis Date  . Acute rhinitis 08/10/2017  . Acute stress disorder 10/27/2007   Qualifier: Diagnosis of  By: JArnoldo MoraleMD, JBalinda Quails  Overview:  Overview:  Qualifier: Diagnosis of  By: JArnoldo MoraleMD, JBalinda Quails . Anxiety 07/14/2018  . Arthritis   . Bilateral epiphora 09/25/2015  . Blush 09/23/2007   Qualifier: Diagnosis of  By: JArnoldo MoraleMD, JBalinda Quails  Overview:  Overview:  Qualifier: Diagnosis of  By: JArnoldo MoraleMD, J[place]([facility]    basal cell on right temple  . Chest pain in adult 12/16/2016  . Chronic right-sided thoracic back pain 12/16/2016  . Compression fracture of body of thoracic vertebra ([place] 02/09/2019  . Dysphagia 07/17/2010   Qualifier: Diagnosis of  By: ETrellis PaganiniPA-c, Amy S   . Eustachian tube dysfunction, bilateral 08/10/2017  .  Ganglion of joint 04/22/2007   Qualifier: Diagnosis of  By: JArnoldo MoraleMD, JBalinda Quails  . Gastroesophageal reflux disease 05/15/2016  . Headache 01/30/2016  . Hemorrhoids   . HEMORRHOIDS, INTERNAL 02/18/2007   Qualifier: Diagnosis of  By: JArnoldo MoraleMD, JBalinda Quails  . IPF (idiopathic pulmonary fibrosis) ([place] 07/14/2018   06/22/2018-CT chest with contrast- spectrum of findings suggestive of basilar predominant fibrotic interstitial lung disease with mild honeycombing asymmetrically involving the right lung with significant progression since February/2019 in September/2019 chest CT.  Findings consistent with UIP.  06/22/2018-echocardiogram- LV ejection fraction 55 to 676% grade 1 diastolic dysfunction, PA P pressure 4  . Lateral epicondylitis (tennis elbow)   . Lingular mass 09/07/2015  . Mass of lingula of lung 09/07/2015  . Mass of lower lobe of left lung 09/07/2015  . Mediastinal adenopathy 09/07/2015  . Mediastinal lymphadenopathy 08/27/2015  . Multiple pulmonary nodules 08/27/2015  . Nausea and vomiting 05/15/2016  . Non-small cell cancer of left lung ([place] 09/24/2015   09/13/2015: Left lower lobe lung biopsy: Overall, the histologic features, in conjunction with the positive staining for cytokeratin 903, cytokeratin 5/6, and p63 support a diagnosis of poorly differentiated squamous cell carcinoma. There is likely sufficient tumor remaining for additional studies, if requested. (JBK:ds 09/17/15)     . Non-small cell lung cancer (NSCLC) ([place] dx'f 07/30/15  . Nuclear sclerosis of right eye 09/25/2015  .  Odynophagia 11/06/2015  . Osteoarthritis 07/17/2010   Qualifier: Diagnosis of  By: Trellis Paganini PA-c, Amy S   . Persistent headaches 02/03/2017  . Pleural effusion on left 08/14/2016  . Pseudophakia, left eye 09/25/2015  . Pulmonary emphysema ([place]) 08/14/2016  . Radiation fibrosis of lung ([place]) 08/14/2016  . Shortness of breath 08/14/2016  . Soft tissue lesion of shoulder region 04/22/2007   Qualifier: Diagnosis of  By: Arnoldo Morale MD,  Balinda Quails   Overview:  Overview:  Qualifier: Diagnosis of  By: Arnoldo Morale MD, Balinda Quails  . Spondylosis of cervical joint   . SPRAIN AND STRAIN OF SACROILIAC 09/06/2008   Qualifier: Diagnosis of  By: Arnoldo Morale MD, Balinda Quails     Patient Active Problem List   Diagnosis Date Noted  . Pulmonary hypertension, unspecified ([place])   . Coordination of complex care 09/19/2019  . Compression fracture of body of thoracic vertebra ([place]) 02/09/2019  . IPF (idiopathic pulmonary fibrosis) ([place]) 07/14/2018  . Anxiety 07/14/2018  . Acute rhinitis 08/10/2017  . Eustachian tube dysfunction, bilateral 08/10/2017  . Persistent headaches 02/03/2017  . Chest pain in adult 12/16/2016  . Chronic right-sided thoracic back pain 12/16/2016  . Pleural effusion on left 08/14/2016  . Pulmonary emphysema ([place]) 08/14/2016  . Radiation fibrosis of lung ([place]) 08/14/2016  . Shortness of breath 08/14/2016  . History of esophageal stricture 05/15/2016  . Gastroesophageal reflux disease 05/15/2016  . Nausea and vomiting 05/15/2016  . Headache 01/30/2016  . Odynophagia 11/06/2015  . Encounter for antineoplastic chemotherapy 10/15/2015  . Bilateral epiphora 09/25/2015  . Nuclear sclerosis of right eye 09/25/2015  . Pseudophakia, left eye 09/25/2015  . Squamous blepharitis of upper eyelids of both eyes 09/25/2015  . Non-small cell cancer of left lung ([place]) 09/24/2015  . Mediastinal adenopathy 09/07/2015  . Mass of lingula of lung 09/07/2015  . Multiple pulmonary nodules 08/27/2015  . Mediastinal lymphadenopathy 08/27/2015  . Osteoarthritis 07/17/2010  . Dysphagia 07/17/2010  . DYSPHAGIA, PHARYNGOESOPHAGEAL PHASE 05/07/2010  . HYPERCHOLESTEROLEMIA, MILD 09/06/2008  . SPRAIN AND STRAIN OF SACROILIAC 09/06/2008  . Acute stress disorder 10/27/2007  . Blush 09/23/2007  . Soft tissue lesion of shoulder region 04/22/2007  . GANGLION OF JOINT 04/22/2007  . HEMORRHOIDS, INTERNAL 02/18/2007    Past Surgical History:  Procedure  Laterality Date  . COLONOSCOPY  2003  . KNEE ARTHROSCOPY  2006   right  . RIGHT HEART CATH N/A 10/06/2019   Procedure: RIGHT HEART CATH;  Surgeon: Nelva Bush, MD;  Location: [facility];  Service: Cardiovascular;  Laterality: N/A;       Family History  Problem Relation Age of Onset  . Hypertension Mother   . Heart disease Mother   . Coronary artery disease Other     Social History   Tobacco Use  . Smoking status: Former Smoker    Packs/day: 0.20    Years: 62.00    Pack years: 12.40    Types: Cigarettes    Quit date: 03/10/2011    Years since quitting: 8.8  . Smokeless tobacco: Never Used  Vaping Use  . Vaping Use: Never used  Substance Use Topics  . Alcohol use: Yes    Alcohol/week: 7.0 standard drinks    Types: 7 Glasses of wine per week    Comment: 1-2 glasses of wine a day  . Drug use: No    Home Medications Prior to Admission medications   Medication Sig Start Date End Date Taking? Authorizing Provider  ALPRAZolam Duanne Moron) 0.25 MG tablet  Take 1 tablet (0.25 mg total) by mouth 2 (two) times daily as needed for anxiety. Patient not taking: Reported on 10/25/2019 09/06/19   [name], NP  calcium carbonate (TUMS - DOSED IN MG ELEMENTAL CALCIUM) 500 MG chewable tablet Chew 1 tablet by mouth 2 (two) times daily as needed for heartburn.     [provider]  citalopram (CELEXA) 10 MG tablet Take 1 tablet (10 mg total) by mouth daily. 12/21/19   Nafziger, Tommi Rumps, NP  cyclobenzaprine (FLEXERIL) 10 MG tablet Take 1 tablet (10 mg total) by mouth 3 (three) times daily as needed for muscle spasms. 09/06/19   Nafziger, Tommi Rumps, NP  esomeprazole (NEXIUM) 40 MG capsule Take 1 capsule (40 mg total) by mouth 2 (two) times daily before a meal. 12/21/19 03/20/20  Nafziger, Tommi Rumps, NP  glucosamine-chondroitin 500-400 MG tablet Take 1 tablet by mouth daily.     [provider]  OFEV 150 MG CAPS Take 1 capsule (150 mg total) by mouth 2 (two) times daily. 07/14/19    Brand Males, MD  predniSONE (DELTASONE) 10 MG tablet TAKE 1 1/2 TABLET BY MOUTH DAILY WITH BREAKFAST 08/10/19   Lauraine Rinne, NP  PROAIR HFA 108 (90 Base) MCG/ACT inhaler INHALE 2 PUFFS BY MOUTH INTO THE LUNGS EVERY 6 HOURS AS NEEDED FOR WHEEZING OR SHORTNESS OF BREATH 08/04/19   Brand Males, MD  rosuvastatin (CRESTOR) 5 MG tablet TAKE 1 TABLET(5 MG) BY MOUTH DAILY 09/15/19   Nafziger, Tommi Rumps, NP  tamsulosin (FLOMAX) 0.4 MG CAPS capsule Take 1 capsule (0.4 mg total) by mouth daily. 04/27/19   Nafziger, Tommi Rumps, NP  umeclidinium bromide (INCRUSE ELLIPTA) 62.5 MCG/INH AEPB Inhale 1 puff into the lungs daily. 07/12/19   Brand Males, MD    Allergies    Gentamicin and Loteprednol-tobramycin  Review of Systems   Review of Systems  Respiratory: Positive for shortness of breath.    All other systems reviewed and are negative except that which was mentioned in HPI  Physical Exam Updated Vital Signs BP (!) 170/78   Pulse (!) 108   Temp 98.3 F (36.8 C) (Oral)   Resp 20   SpO2 92%   Physical Exam Vitals and nursing note reviewed.  Constitutional:      General: He is not in acute distress.    Appearance: He is well-developed.  HENT:     Head: Normocephalic and atraumatic.  Eyes:     Conjunctiva/sclera: Conjunctivae normal.     Pupils: Pupils are equal, round, and reactive to light.  Cardiovascular:     Rate and Rhythm: Regular rhythm. Tachycardia present.     Heart sounds: Normal heart sounds. No murmur heard.   Pulmonary:     Effort: Tachypnea present. No respiratory distress.     Comments: Tachypnea with increased WOB, no respiratory distress, able to speak in full sentences; diminished BS b/l with occasional end-expiratory wheeze, occasional crackle b/l Abdominal:     General: Bowel sounds are normal. There is no distension.     Palpations: Abdomen is soft.     Tenderness: There is no abdominal tenderness.  Musculoskeletal:     Cervical back: Neck supple.     Right  lower leg: No edema.     Left lower leg: No edema.  Skin:    General: Skin is warm and dry.  Neurological:     Mental Status: He is alert and oriented to person, place, and time.     Comments: Fluent speech  Psychiatric:  Mood and Affect: Mood normal.        Judgment: Judgment normal.     ED Results / Procedures / Treatments   Labs (all labs ordered are listed, but only abnormal results are displayed) Labs Reviewed  COMPREHENSIVE METABOLIC PANEL - Abnormal; Notable for the following components:      Result Value   Glucose, Bld 132 (*)    Calcium 8.2 (*)    Albumin 3.4 (*)    All other components within normal limits  CBC WITH DIFFERENTIAL/PLATELET - Abnormal; Notable for the following components:   WBC 13.7 (*)    Neutro Abs 11.8 (*)    Monocytes Absolute 1.1 (*)    Abs Immature Granulocytes 0.10 (*)    All other components within normal limits  BRAIN NATRIURETIC PEPTIDE - Abnormal; Notable for the following components:   B Natriuretic Peptide 141.6 (*)    All other components within normal limits  LACTIC ACID, PLASMA - Abnormal; Notable for the following components:   Lactic Acid, Venous 5.0 (*)    All other components within normal limits  D-DIMER, QUANTITATIVE (NOT AT [facility]) - Abnormal; Notable for the following components:   D-Dimer, Quant 2.59 (*)    All other components within normal limits  CBC - Abnormal; Notable for the following components:   WBC 13.1 (*)    All other components within normal limits  CREATININE, SERUM - Abnormal; Notable for the following components:   GFR calc non Af Amer 59 (*)    All other components within normal limits  TROPONIN I (HIGH SENSITIVITY) - Abnormal; Notable for the following components:   Troponin I (High Sensitivity) 34 (*)    All other components within normal limits  SARS CORONAVIRUS 2 BY RT PCR (HOSPITAL ORDER, [facility])  MRSA PCR SCREENING  EXPECTORATED SPUTUM ASSESSMENT W REFEX TO RESP  CULTURE  MRSA PCR SCREENING  PROCALCITONIN  MAGNESIUM  STREP PNEUMONIAE URINARY ANTIGEN  LEGIONELLA PNEUMOPHILA SEROGP 1 UR AG  LACTIC ACID, PLASMA    EKG None  Radiology DG Chest Port 1 View  Result Date: 12/28/2019 CLINICAL DATA:  Worsening shortness of breath. History of pulmonary fibrosis. EXAM: PORTABLE CHEST 1 VIEW COMPARISON:  09/19/2019 FINDINGS: The heart size appears normal. Chronic loculated left pleural effusion overlying the posterolateral left lower lobe is identified. Extensive, bilateral changes of pulmonary fibrosis identified. Similar appearance of focal, chronic airspace density within the left upper lobe. New hazy opacity is identified within the periphery of the right midlung which may reflect superimposed pneumonia. IMPRESSION: 1. New hazy opacity within the periphery of the right midlung which may reflect superimposed pneumonia. 2. Stable chronic airspace density within the left upper lobe secondary to chronic fibrotic changes. 3. Chronic loculated left pleural effusion. Electronically Signed   By: Kerby Moors M.D.   On: 12/28/2019 10:46    Procedures Procedures (including critical care time)  Medications Ordered in ED Medications  albuterol (VENTOLIN HFA) 108 (90 Base) MCG/ACT inhaler 8 puff (has no administration in time range)    ED Course  I have reviewed the triage vital signs and the nursing notes.  Pertinent labs & imaging results that were available during my care of the patient were reviewed by me and considered in my medical decision making (see chart for details).    MDM Rules/Calculators/A&P                          PT  dyspneic but no severe distress on exam. O2 sat mid-90s on 4L [place].  Initial lab work shows reassuring CMP, WBC 13.7, BNP 141, troponin 34, COVID-19 negative.Chest x-ray shows opacity in mid right lung suggestive of possible superimposed pneumonia in the setting of IPF. Gave community-acquired pneumonia coverage with ceftriaxone  and doxycycline.  Discussed admission with [facility] hospitalist, Dr. Neysa Bonito, who will admit for further care. Final Clinical Impression(s) / ED Diagnoses Final diagnoses:  Acute respiratory failure with hypoxia ([place])  Community acquired pneumonia, unspecified laterality    Rx / DC Orders ED Discharge Orders    None       Lacora Folmer, Wenda Overland, MD 12/28/19 1631

## 2019-12-28 NOTE — Progress Notes (Signed)
Pharmacy Antibiotic Note  Jason Summers is a 86 y.o. male admitted on 12/28/2019 with pneumonia.  Pharmacy has been consulted for piperacillin/tazobactam dosing.  Pt has PMH significant for NSCLC, IPF; presenting with SOB. Broad spectrum antibiotics initiated with pseudomonal coverage.  Today, 12/28/19 -WBC 13.7 -SCr 0.88, CrCl ~55 mL/min -Afebrile  Plan:  Piperacillin/tazobactam 3.375 g IV q8h EI  SCr WNL. Pharmacy to sign off. Please re-consult if needed.      Temp (24hrs), Avg:98.3 F (36.8 C), Min:98.3 F (36.8 C), Max:98.3 F (36.8 C)  Recent Labs  Lab 12/28/19 1031  WBC 13.7*  CREATININE 0.88    Estimated Creatinine Clearance: 54.4 mL/min (by C-G formula based on SCr of 0.88 mg/dL).    Allergies  Allergen Reactions  . Gentamicin Other (See Comments)    Made eyes red, swollen, hot feeling  . Loteprednol-Tobramycin Other (See Comments)    Other reaction(s): Other (See Comments) Made eyes Red, Swollen, Red, Warm feeling  Other reaction(s): Other (See Comments) Made eyes Red, Swollen, Red, Warm feeling  Made eyes Red, Swollen, Red, Warm feeling     Antimicrobials this admission: Piperacillin/tazobactam 7/21 >>   Dose adjustments this admission:  Microbiology results:  Thank you for allowing pharmacy to be a part of this patient's care.  Lenis Noon, PharmD 12/28/2019 12:35 PM

## 2019-12-28 NOTE — Consult Note (Signed)
NAMEPieter Summers, MRN:  [id number], DOB:  07/24/1931, LOS: 0 ADMISSION DATE:  12/28/2019, CONSULTATION DATE: 12/28/2019 REFERRING MD: Dr. Neysa Bonito, CHIEF COMPLAINT: Shortness of breath  Brief History   86 year old gentleman with a history of pulmonary fibrosis Follows up regularly with Dr. Chase Caller Shortness of breath worsening over the last couple of days, could not even take a shower on the morning of presentation Has been coughing with initially clear phlegm in the last few days a little greenish Denies a fever, no chills Is short of breath with most activity Reformed smoker Was exposed to a lot of glue material relating to woodworking in is working life He is on OVEF and compliant Inhalers have not helped him in the past Last evaluation did not qualify for oxygen supplementation Cardiac catheterization did not show significant pulmonary hypertension  Past Medical History   Past Medical History:  Diagnosis Date  . Acute rhinitis 08/10/2017  . Acute stress disorder 10/27/2007   Qualifier: Diagnosis of  By: Arnoldo Morale MD, Balinda Quails   Overview:  Overview:  Qualifier: Diagnosis of  By: Arnoldo Morale MD, Balinda Quails  . Anxiety 07/14/2018  . Arthritis   . Bilateral epiphora 09/25/2015  . Blush 09/23/2007   Qualifier: Diagnosis of  By: Arnoldo Morale MD, Balinda Quails   Overview:  Overview:  Qualifier: Diagnosis of  By: Arnoldo Morale MD, [facility])    basal cell on right temple  . Chest pain in adult 12/16/2016  . Chronic right-sided thoracic back pain 12/16/2016  . Compression fracture of body of thoracic vertebra ([place]) 02/09/2019  . Dysphagia 07/17/2010   Qualifier: Diagnosis of  By: Trellis Paganini PA-c, Amy S   . Eustachian tube dysfunction, bilateral 08/10/2017  . Ganglion of joint 04/22/2007   Qualifier: Diagnosis of  By: Arnoldo Morale MD, Balinda Quails   . Gastroesophageal reflux disease 05/15/2016  . Headache 01/30/2016  . Hemorrhoids   . HEMORRHOIDS, INTERNAL 02/18/2007   Qualifier: Diagnosis of  By: Arnoldo Morale MD, Balinda Quails   . IPF  (idiopathic pulmonary fibrosis) ([place]) 07/14/2018   06/22/2018-CT chest with contrast- spectrum of findings suggestive of basilar predominant fibrotic interstitial lung disease with mild honeycombing asymmetrically involving the right lung with significant progression since February/2019 in September/2019 chest CT.  Findings consistent with UIP.  06/22/2018-echocardiogram- LV ejection fraction 55 to 12%, grade 1 diastolic dysfunction, PA P pressure 4  . Lateral epicondylitis (tennis elbow)   . Lingular mass 09/07/2015  . Mass of lingula of lung 09/07/2015  . Mass of lower lobe of left lung 09/07/2015  . Mediastinal adenopathy 09/07/2015  . Mediastinal lymphadenopathy 08/27/2015  . Multiple pulmonary nodules 08/27/2015  . Nausea and vomiting 05/15/2016  . Non-small cell cancer of left lung ([place]) 09/24/2015   09/13/2015: Left lower lobe lung biopsy: Overall, the histologic features, in conjunction with the positive staining for cytokeratin 903, cytokeratin 5/6, and p63 support a diagnosis of poorly differentiated squamous cell carcinoma. There is likely sufficient tumor remaining for additional studies, if requested. (JBK:ds 09/17/15)     . Non-small cell lung cancer (NSCLC) ([place]) dx'f 07/30/15  . Nuclear sclerosis of right eye 09/25/2015  . Odynophagia 11/06/2015  . Osteoarthritis 07/17/2010   Qualifier: Diagnosis of  By: Trellis Paganini PA-c, Amy S   . Persistent headaches 02/03/2017  . Pleural effusion on left 08/14/2016  . Pseudophakia, left eye 09/25/2015  . Pulmonary emphysema ([place]) 08/14/2016  . Radiation fibrosis of lung ([place]) 08/14/2016  . Shortness of breath 08/14/2016  . Soft  tissue lesion of shoulder region 04/22/2007   Qualifier: Diagnosis of  By: Arnoldo Morale MD, Balinda Quails   Overview:  Overview:  Qualifier: Diagnosis of  By: Arnoldo Morale MD, Balinda Quails  . Spondylosis of cervical joint   . SPRAIN AND STRAIN OF SACROILIAC 09/06/2008   Qualifier: Diagnosis of  By: Arnoldo Morale MD, [facility]   Breathing  feels a little bit better this afternoon compared to when he came in this morning  Consults:  pccm  Procedures:  None  Significant Diagnostic Tests:  CT chest IMPRESSION: 1. No demonstrable pulmonary embolus. No thoracic aortic aneurysm. There is aortic atherosclerosis as well as foci of coronary artery calcification.  2. Underlying emphysematous change. Stable hydropneumothorax on the left. Underlying areas of fibrosis and bronchiectatic change. Airspace opacity in the left lower lobe is stable and likely represents chronic scarring and atelectatic change. No new mass or infiltrative appearing lesion in the lungs evident.  3.  Stable enlarged pericarinal lymph node.  No new adenopathy.  4.  Hiatal hernia.  5. Stable wedge compression fracture at T6. Neoplastic etiology questioned.  Aortic Atherosclerosis (ICD10-I70.0) and Emphysema (ICD10-J43.9).  Micro Data:  SARS coronavirus negative Sputum cultures requested  Antimicrobials:  Doxycycline and Zosyn ordered  Interim history/subjective:  Feels a little bit better Denies any chest pains or chest discomfort  Objective   Blood pressure 113/64, pulse (!) 111, temperature 98.3 F (36.8 C), temperature source Oral, resp. rate (!) 41, SpO2 95 %.       No intake or output data in the 24 hours ending 12/28/19 1401 There were no vitals filed for this visit.  Examination: General: Elderly gentleman, conscious HENT: Moist oral mucosa Lungs: Decreased air entry bilaterally Cardiovascular: S1-S2 appreciated Abdomen: Soft, bowel sounds appreciated Extremities: No clubbing, no edema Neuro: Alert and oriented GU:   Resolved Hospital Problem list     Assessment & Plan:  Acute hypoxemic respiratory failure -Continue oxygen supplementation -This may be secondary to COPD exacerbation -Underlying pulmonary fibrosis  History of pulmonary fibrosis -Part of this is related to idiopathic pulmonary fibrosis, some may  also be related to post radiation fibrosis -On Ofev -He can continue Ofev  COPD with exacerbation -Significant worsening the last couple of days -Cough with sputum production -Bronchodilators, steroids, antibiotics -Continue Zosyn and doxycycline -Sputum cultures  Anxiety/depression -Continue home medications  History of lung cancer -Treated with chemotherapy and radiation treatment -Has been stable for about 3 years now -Had non-small cell lung cancer-followed by Dr. Earlie Server  He was on steroids recently that was being weaned -We will need to escalate in the short-term  I did bring up prognosis about interstitial lung disease -They have had conversations about this and are aware -Continues on Ofev on a regular basis  History of hoarseness -Has been stable  We will continue to follow Guarded prognosis  Best practice:  Diet: 2 g sodium diet Pain/Anxiety/Delirium protocol (if indicated): Tylenol as needed VAP protocol (if indicated): Not indicated DVT prophylaxis: lovenox Mobility: bedrest Code Status: partial Family Communication: Discussed with patient and spouse at bedside Disposition:   Labs   CBC: Recent Labs  Lab 12/28/19 1031  WBC 13.7*  NEUTROABS 11.8*  HGB 14.5  HCT 44.4  MCV 91.0  PLT 431    Basic Metabolic Panel: Recent Labs  Lab 12/28/19 1031  NA 139  K 3.8  CL 102  CO2 27  GLUCOSE 132*  BUN 20  CREATININE 0.88  CALCIUM  8.2*   GFR: Estimated Creatinine Clearance: 54.4 mL/min (by C-G formula based on SCr of 0.88 mg/dL). Recent Labs  Lab 12/28/19 1031  WBC 13.7*    Liver Function Tests: Recent Labs  Lab 12/28/19 1031  AST 19  ALT 17  ALKPHOS 58  BILITOT 0.9  PROT 6.6  ALBUMIN 3.4*   No results for input(s): LIPASE, AMYLASE in the last 168 hours. No results for input(s): AMMONIA in the last 168 hours.  ABG    Component Value Date/Time   HCO3 26.3 10/06/2019 1029   TCO2 28 10/06/2019 1029   O2SAT 73.0 10/06/2019 1029      Coagulation Profile: No results for input(s): INR, PROTIME in the last 168 hours.  Cardiac Enzymes: No results for input(s): CKTOTAL, CKMB, CKMBINDEX, TROPONINI in the last 168 hours.  HbA1C: No results found for: HGBA1C  CBG: No results for input(s): GLUCAP in the last 168 hours.  Review of Systems:   Shortness of breath  Past Medical History  He,  has a past medical history of Acute rhinitis (08/10/2017), Acute stress disorder (10/27/2007), Anxiety (07/14/2018), Arthritis, Bilateral epiphora (09/25/2015), Blush (09/23/2007), Cancer ([place]), Chest pain in adult (12/16/2016), Chronic right-sided thoracic back pain (12/16/2016), Compression fracture of body of thoracic vertebra ([place]) (02/09/2019), Dysphagia (07/17/2010), Eustachian tube dysfunction, bilateral (08/10/2017), Ganglion of joint (04/22/2007), Gastroesophageal reflux disease (05/15/2016), Headache (01/30/2016), Hemorrhoids, HEMORRHOIDS, INTERNAL (02/18/2007), IPF (idiopathic pulmonary fibrosis) ([place]) (07/14/2018), Lateral epicondylitis (tennis elbow), Lingular mass (09/07/2015), Mass of lingula of lung (09/07/2015), Mass of lower lobe of left lung (09/07/2015), Mediastinal adenopathy (09/07/2015), Mediastinal lymphadenopathy (08/27/2015), Multiple pulmonary nodules (08/27/2015), Nausea and vomiting (05/15/2016), Non-small cell cancer of left lung ([place]) (09/24/2015), Non-small cell lung cancer (NSCLC) ([place]) (dx'f 07/30/15), Nuclear sclerosis of right eye (09/25/2015), Odynophagia (11/06/2015), Osteoarthritis (07/17/2010), Persistent headaches (02/03/2017), Pleural effusion on left (08/14/2016), Pseudophakia, left eye (09/25/2015), Pulmonary emphysema ([place]) (08/14/2016), Radiation fibrosis of lung ([place]) (08/14/2016), Shortness of breath (08/14/2016), Soft tissue lesion of shoulder region (04/22/2007), Spondylosis of cervical joint, and SPRAIN AND STRAIN OF SACROILIAC (09/06/2008).   Surgical History    Past Surgical History:  Procedure Laterality Date  . COLONOSCOPY  2003    . KNEE ARTHROSCOPY  2006   right  . RIGHT HEART CATH N/A 10/06/2019   Procedure: RIGHT HEART CATH;  Surgeon: Nelva Bush, MD;  Location: [facility];  Service: Cardiovascular;  Laterality: N/A;     Social History   reports that he quit smoking about 8 years ago. His smoking use included cigarettes. He has a 12.40 pack-year smoking history. He has never used smokeless tobacco. He reports current alcohol use of about 7.0 standard drinks of alcohol per week. He reports that he does not use drugs.   Family History   His family history includes Coronary artery disease in an other family member; Heart disease in his mother; Hypertension in his mother.   Allergies Allergies  Allergen Reactions  . Gentamicin Other (See Comments)    Made eyes red, swollen, hot feeling  . Loteprednol-Tobramycin Other (See Comments)    Other reaction(s): Other (See Comments) Made eyes Red, Swollen, Red, Warm feeling  Other reaction(s): Other (See Comments) Made eyes Red, Swollen, Red, Warm feeling  Made eyes Red, Swollen, Red, Warm feeling     Sherrilyn Rist, MD [place] [facility] Pager: [phone]

## 2019-12-28 NOTE — ED Triage Notes (Signed)
Arrives via EMS from home. C/C SOB. Hx of pulmonary fibrosis with increasing worsening over the past 2 weeks. Fire responded first, O2 in the 60s RA, on 12L [place] with EMS, O2 100%. EMS gave 2 albuterol, 10 mg total, 0.5 of Atrovent, 125 mg Solumedrol.   86 G LAC Vitals with EMS BP 125/60 HR 116 (occasional PVCs) RR 40s O2 99% on NRB CBG 178

## 2019-12-28 NOTE — Progress Notes (Signed)
eLink Physician-Brief Progress Note Patient Name: Jason Summers DOB: 1932-02-02 MRN: [id number]   Date of Service  12/28/2019  HPI/Events of Note  Oliguria - Bladder scan with 630+ mL residual.   eICU Interventions  Plan: 1. I/O cath PRN.      Intervention Category Major Interventions: Other:  Lysle Dingwall 12/28/2019, 10:56 PM

## 2019-12-29 ENCOUNTER — Telehealth: Payer: Self-pay | Admitting: Internal Medicine

## 2019-12-29 LAB — BASIC METABOLIC PANEL
Anion gap: 12 (ref 5–15)
BUN: 22 mg/dL (ref 8–23)
CO2: 24 mmol/L (ref 22–32)
Calcium: 8.6 mg/dL — ABNORMAL LOW (ref 8.9–10.3)
Chloride: 103 mmol/L (ref 98–111)
Creatinine, Ser: 0.76 mg/dL (ref 0.61–1.24)
GFR calc Af Amer: >60 mL/min (ref >60)
GFR calc non Af Amer: >60 mL/min (ref >60)
Glucose, Bld: 148 mg/dL — ABNORMAL HIGH (ref 70–99)
Potassium: 4 mmol/L (ref 3.5–5.1)
Sodium: 139 mmol/L (ref 135–145)

## 2019-12-29 LAB — CBC
HCT: 43.1 % (ref 39.0–52.0)
Hemoglobin: 13.9 g/dL (ref 13.0–17.0)
MCH: 29.6 pg (ref 26.0–34.0)
MCHC: 32.3 g/dL (ref 30.0–36.0)
MCV: 91.7 fL (ref 80.0–100.0)
Platelets: 145 10*3/uL — ABNORMAL LOW (ref 150–400)
RBC: 4.7 MIL/uL (ref 4.22–5.81)
RDW: 14.3 % (ref 11.5–15.5)
WBC: 13.7 10*3/uL — ABNORMAL HIGH (ref 4.0–10.5)
nRBC: 0 % (ref 0.0–0.2)

## 2019-12-29 LAB — PROTIME-INR
INR: 1.1 (ref 0.8–1.2)
Prothrombin Time: 14.1 seconds (ref 11.4–15.2)

## 2019-12-29 LAB — URINALYSIS, ROUTINE W REFLEX MICROSCOPIC: RBC / HPF: >50 RBC/hpf — ABNORMAL HIGH (ref 0–5)

## 2019-12-29 LAB — HEMOGLOBIN AND HEMATOCRIT, BLOOD
HCT: 44.1 % (ref 39.0–52.0)
Hemoglobin: 14.3 g/dL (ref 13.0–17.0)

## 2019-12-29 LAB — LACTIC ACID, PLASMA: Lactic Acid, Venous: 1.1 mmol/L (ref 0.5–1.9)

## 2019-12-29 LAB — LEGIONELLA PNEUMOPHILA SEROGP 1 UR AG: L. pneumophila Serogp 1 Ur Ag: NEGATIVE

## 2019-12-29 MED ORDER — ENOXAPARIN SODIUM 40 MG/0.4ML ~~LOC~~ SOLN
40.0000 mg | SUBCUTANEOUS | Status: DC
  Administered 2019-12-30 – 2020-01-04 (×6): 40 mg via SUBCUTANEOUS
  Filled 2019-12-29 (×6): qty 0.4

## 2019-12-29 MED ORDER — AMOXICILLIN-POT CLAVULANATE 875-125 MG PO TABS
1.0000 | ORAL_TABLET | Freq: Two times a day (BID) | ORAL | Status: AC
  Administered 2019-12-29 – 2020-01-04 (×12): 1 via ORAL
  Filled 2019-12-29 (×12): qty 1

## 2019-12-29 MED ORDER — PREDNISONE 20 MG PO TABS
30.0000 mg | ORAL_TABLET | Freq: Every day | ORAL | Status: DC
  Administered 2019-12-30 – 2020-01-04 (×6): 30 mg via ORAL
  Filled 2019-12-29 (×8): qty 1

## 2019-12-29 MED ORDER — MELATONIN 5 MG PO TABS
5.0000 mg | ORAL_TABLET | Freq: Every day | ORAL | Status: DC
  Administered 2019-12-29 – 2020-01-03 (×6): 5 mg via ORAL
  Filled 2019-12-29 (×6): qty 1

## 2019-12-29 NOTE — Progress Notes (Signed)
NAMEZeplin Summers, MRN:  [id number], DOB:  07/25/1931, LOS: 1 ADMISSION DATE:  12/28/2019, CONSULTATION DATE:  12/28/2019 REFERRING MD:  Dr segal, CHIEF COMPLAINT:  Shortness of breath   Brief History   86 year old gentleman with a history of pulmonary fibrosis Follows up regularly with Dr. Chase Caller Shortness of breath worsening over the last couple of days, could not even take a shower on the morning of presentation Has been coughing with initially clear phlegm in the last few days a little greenish Denies a fever, no chills Is short of breath with most activity Reformed smoker Was exposed to a lot of glue material relating to woodworking in is working life He is on OVEF and compliant Inhalers have not helped him in the past Last evaluation did not qualify for oxygen supplementation Cardiac catheterization did not show significant pulmonary hypertension   Past Medical History   Past Medical History:  Diagnosis Date  . Acute rhinitis 08/10/2017  . Acute stress disorder 10/27/2007   Qualifier: Diagnosis of  By: Arnoldo Morale MD, Balinda Quails   Overview:  Overview:  Qualifier: Diagnosis of  By: Arnoldo Morale MD, Balinda Quails  . Anxiety 07/14/2018  . Arthritis   . Bilateral epiphora 09/25/2015  . Blush 09/23/2007   Qualifier: Diagnosis of  By: Arnoldo Morale MD, Balinda Quails   Overview:  Overview:  Qualifier: Diagnosis of  By: Arnoldo Morale MD, [facility])    basal cell on right temple  . Chest pain in adult 12/16/2016  . Chronic right-sided thoracic back pain 12/16/2016  . Compression fracture of body of thoracic vertebra ([place]) 02/09/2019  . Dysphagia 07/17/2010   Qualifier: Diagnosis of  By: Trellis Paganini PA-c, Amy S   . Eustachian tube dysfunction, bilateral 08/10/2017  . Ganglion of joint 04/22/2007   Qualifier: Diagnosis of  By: Arnoldo Morale MD, Balinda Quails   . Gastroesophageal reflux disease 05/15/2016  . Headache 01/30/2016  . Hemorrhoids   . HEMORRHOIDS, INTERNAL 02/18/2007   Qualifier: Diagnosis of  By: Arnoldo Morale MD, Balinda Quails   .  IPF (idiopathic pulmonary fibrosis) ([place]) 07/14/2018   06/22/2018-CT chest with contrast- spectrum of findings suggestive of basilar predominant fibrotic interstitial lung disease with mild honeycombing asymmetrically involving the right lung with significant progression since February/2019 in September/2019 chest CT.  Findings consistent with UIP.  06/22/2018-echocardiogram- LV ejection fraction 55 to 36%, grade 1 diastolic dysfunction, PA P pressure 4  . Lateral epicondylitis (tennis elbow)   . Lingular mass 09/07/2015  . Mass of lingula of lung 09/07/2015  . Mass of lower lobe of left lung 09/07/2015  . Mediastinal adenopathy 09/07/2015  . Mediastinal lymphadenopathy 08/27/2015  . Multiple pulmonary nodules 08/27/2015  . Nausea and vomiting 05/15/2016  . Non-small cell cancer of left lung ([place]) 09/24/2015   09/13/2015: Left lower lobe lung biopsy: Overall, the histologic features, in conjunction with the positive staining for cytokeratin 903, cytokeratin 5/6, and p63 support a diagnosis of poorly differentiated squamous cell carcinoma. There is likely sufficient tumor remaining for additional studies, if requested. (JBK:ds 09/17/15)     . Non-small cell lung cancer (NSCLC) ([place]) dx'f 07/30/15  . Nuclear sclerosis of right eye 09/25/2015  . Odynophagia 11/06/2015  . Osteoarthritis 07/17/2010   Qualifier: Diagnosis of  By: Trellis Paganini PA-c, Amy S   . Persistent headaches 02/03/2017  . Pleural effusion on left 08/14/2016  . Pseudophakia, left eye 09/25/2015  . Pulmonary emphysema ([place]) 08/14/2016  . Radiation fibrosis of lung ([place]) 08/14/2016  . Shortness of  breath 08/14/2016  . Soft tissue lesion of shoulder region 04/22/2007   Qualifier: Diagnosis of  By: Arnoldo Morale MD, Balinda Quails   Overview:  Overview:  Qualifier: Diagnosis of  By: Arnoldo Morale MD, Balinda Quails  . Spondylosis of cervical joint   . SPRAIN AND STRAIN OF SACROILIAC 09/06/2008   Qualifier: Diagnosis of  By: Arnoldo Morale MD, [place] [facility]    Breathing feels a little bit better Stable from yesterday  Consults:  pccm  Procedures:  None  Significant Diagnostic Tests:  CT chest IMPRESSION: 1. No demonstrable pulmonary embolus. No thoracic aortic aneurysm. There is aortic atherosclerosis as well as foci of coronary artery calcification.  2. Underlying emphysematous change. Stable hydropneumothorax on the left. Underlying areas of fibrosis and bronchiectatic change. Airspace opacity in the left lower lobe is stable and likely represents chronic scarring and atelectatic change. No new mass or infiltrative appearing lesion in the lungs evident.  3. Stable enlarged pericarinal lymph node. No new adenopathy.  4. Hiatal hernia.  5. Stable wedge compression fracture at T6. Neoplastic etiology questioned.  Aortic Atherosclerosis (ICD10-I70.0) and Emphysema (ICD10-J43.9).  Micro Data:  SARS coronavirus negative Sputum cultures requested  Antimicrobials:  Zosyn 7/21 Doxycycline7/21>> Augmentin 7/22>>  Interim history/subjective:  No overnight events Did have some blood in his urine  Objective   Blood pressure (!) 117/62, pulse 88, temperature 98.1 F (36.7 C), temperature source Oral, resp. rate (!) 28, height _0  (1.676 m), weight 74.4 kg, SpO2 95 %.        Intake/Output Summary (Last 24 hours) at 12/29/2019 1500 Last data filed at 12/29/2019 1400 Gross per 24 hour  Intake 844 ml  Output 1000 ml  Net -156 ml   Filed Weights   12/28/19 1654  Weight: 74.4 kg    Examination: General: Elderly gentleman does not appear to be in distress HENT: Moist oral mucosa Lungs: Decreased air entry bilaterally Cardiovascular: S1-S2 appreciated Abdomen: Bowel sounds appreciated Extremities: No clubbing, no edema  Resolved Hospital Problem list     Assessment & Plan:  Acute hypoxemic respiratory failure -Continue oxygen supplementation -May be likely secondary to COPD exacerbation -Underlying  pulmonary fibrosis  History of pulmonary fibrosis -Compliant with Ofev  COPD with exacerbation -Significant worsening lately -Bronchodilators -Steroids -Antibiotics -Switched to doxycycline and Augmentin -Steroids switch to oral prednisone  Anxiety and depression -Continue home medications  History of lung cancer -Stable for the last 3 years -Treated with chemotherapy and radiation treatment  Steroids escalated -Started on 30 mg p.o. daily  May be transferred back to the medical floor  Chronic hoarseness  Rest of management per primary service   Sherrilyn Rist, MD [place] [facility] Pager: [phone]

## 2019-12-29 NOTE — Progress Notes (Signed)
Called and updated spouse on patient condition and transfer orders. Gave spouse patients new room number and addressed all questions and concerns.

## 2019-12-29 NOTE — Progress Notes (Signed)
This Designer, multimedia assisted patient into wheelchair to transfer to 1427. Once in chair patient's O2 sats dropped to low 70s on 4L [place] and Respirations got up to high 60s, rapid and shallow breaths. Pt stated "I can't breath". Allowed patient to recover on 6L [place]. Sats slowly recovered back to low 90s after about 5 minutes with patient stating improvement. Respirations remained tachy. Pt transferred back to bed with same respiratory events taking place upon exertion, and transfer order cancelled. Will reevaluate in AM. Son updated at bedside and Wife called.

## 2019-12-29 NOTE — Progress Notes (Signed)
PROGRESS NOTE    Patient: Jason Summers                            PCP: [name], NP                    DOB: 02-Nov-1931            DOA: 12/28/2019 [id number]             DOS: 12/29/2019, 12:17 PM   LOS: 1 day   Date of Service: The patient was seen and examined on 12/29/2019  Subjective:   The patient was seen and examined this morning, in mild to moderate distress, with labored breathing, on 4 L of oxygen, satting 94% Remain awake alert oriented, following command. Reporting he is usually not O2 dependent at home.  Family present at bedside -all questions were addressed  The patient and family confirms full CODE STATUS.  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 Chase Caller, GERD who presented to [facility] ED on 7/21 with progressively worsening shortness of breath for several weeks with acute decompensation this a.m. and hypoxia at home prompting his wife to call EMS.    Upon arrival patient was on 12 L of oxygen, was treated albuterol Atrovent DuoNeb, Solu-Medrol IV 125 mg.  O2 demand has improved to 4 L. Chest x-ray revealed RML opacity concerning for pneumonia density in LUL secondary to chronic fibrotic changes Met SIRS criteria, sepsis due to hypoxia, WBC of 13.7, blood pressure 102/62, pulse 111, RR 26 SARS-CoV-2 negative  Cultures were obtained broad-spectrum antibiotics of Rocephin and doxycycline was initiated Patient was admitted to stepdown unit.    Assessment & Plan:   Active Problems:   Acute hypoxemic respiratory failure (HCC)   Sepsis  Acute hypoxic respiratory failure suspect secondary to IPF exacerbation and CAP Patient met the sepsis criteria leukocytosis 13.7, hypoxia, tachycardia, tachypnea -Lactic acid 5.0, 4.6, 1.3, 1.1 this morning -Initiated on sepsis protocol, fluid resuscitation, IV antibiotics, follow-up cultures -This morning temp 97.6, pulse 86, RR 26,  blood pressure 159/94, satting 94% on 4 L of oxygen  -O2 demand has improved from 12 L to 4 L this a.m., satting 94% -Remains tachypneic, with mild labored breathing -We will continue IV steroids -Antibiotics broadened to Zosyn  -in anticipation of covering Pseudomonas -MRSA nares negative-withholding use of vancomycin at this time -Pulmonary critical consulted, following accordingly appreciate input -Continue doxycycline (holding azithromycin for now as unsure QT interval) -Holding procalcitonin as he is on chronic steroids at home  Chest x-ray was reviewed.  Due to hypoxia, elevated D-dimer followed by CT angiogram Which revealed negative for pulmonary embolism, groundglass appearance, questionable infiltrate -SARS-CoV-2 negative   Tachycardia with elevated troponin a. Troponin 34 likely from demand ischemia b. Irregular rhythm on telemetry concerning for possible A. fib without known history - check EKG and magnesium c. Continue telemetry d. Lopressor as needed for HR> 110 e. All EKGs were personally reviewed, sinus tachycardia, with PVCs noted,  3. History of IPF a. Follows with Dr. Chase Caller b. On nintendanib as an outpatient, on hold pending PCCM recommendations  Nutritional status:          Cultures; Blood Cultures x 2 >> Urine Culture  >>>   Trying to sputum Culture >>   Antimicrobials: 12/27/2020 IV antibiotic Rocephin x1 dose. 12/28/2019 IV antibiotics Zosyn>>>  Consultants: Pulmonary critical care team   ------------------------------------------------------------------------------------------------------------------------------------------  DVT prophylaxis:   SCD/Compression stockings and Lovenox SQ  Code Status:   Code Status: Partial Code Family Communication: Wife and son present at bedside discussed finding plan of care in detail The above findings and plan of care has been discussed with patient (and family )  in detail,  they expressed  understanding and agreement of above. -Advance care planning has been discussed.   Admission status:    Status is: Inpatient  Remains inpatient appropriate because:Inpatient level of care appropriate due to severity of illness   Dispo: The patient is from: Home              Anticipated d/c is to: Home              Anticipated d/c date is: > 3 days              Patient currently is not medically stable to d/c.  In respiratory failure, meeting sepsis criteria Needing IV fluids, IV antibiotics, breathing treatments, oxygen supplementation.  Needing Subspecialty pulmonary consultation        Procedures:   No admission procedures for hospital encounter.   Antimicrobials:  Anti-infectives (From admission, onward)   Start     Dose/Rate Route Frequency Ordered Stop   12/28/19 2000  piperacillin-tazobactam (ZOSYN) IVPB 3.375 g     Discontinue     3.375 g 12.5 mL/hr over 240 Minutes Intravenous Every 8 hours 12/28/19 1238     12/28/19 1400  doxycycline (VIBRA-TABS) tablet 100 mg     Discontinue     100 mg Oral Every 12 hours 12/28/19 1330     12/28/19 1345  cefTRIAXone (ROCEPHIN) 1 g in sodium chloride 0.9 % 100 mL IVPB  Status:  Discontinued        1 g 200 mL/hr over 30 Minutes Intravenous Every 24 hours 12/28/19 1330 12/28/19 1335   12/28/19 1245  piperacillin-tazobactam (ZOSYN) IVPB 3.375 g        3.375 g 100 mL/hr over 30 Minutes Intravenous  Once 12/28/19 1234 12/28/19 1456   12/28/19 1100  cefTRIAXone (ROCEPHIN) 1 g in sodium chloride 0.9 % 100 mL IVPB  Status:  Discontinued        1 g 200 mL/hr over 30 Minutes Intravenous  Once 12/28/19 1057 12/28/19 1226   12/28/19 1100  doxycycline (VIBRA-TABS) tablet 100 mg  Status:  Discontinued        100 mg Oral  Once 12/28/19 1057 12/28/19 1330       Medication:  . Chlorhexidine Gluconate Cloth  6 each Topical Daily  . citalopram  10 mg Oral Daily  . doxycycline  100 mg Oral Q12H  . enoxaparin (LOVENOX) injection  40 mg  Subcutaneous Q24H  . mouth rinse  15 mL Mouth Rinse BID  . methylPREDNISolone (SOLU-MEDROL) injection  125 mg Intravenous Q6H  . Nintedanib  150 mg Oral BID WC  . pantoprazole  40 mg Oral Daily  . rosuvastatin  5 mg Oral Daily  . sodium chloride flush  3 mL Intravenous Q12H  . umeclidinium bromide  1 puff Inhalation Daily    acetaminophen **OR** acetaminophen, albuterol, metoprolol tartrate, polyethylene glycol   Objective:   Vitals:   12/29/19 0000 12/29/19 0400 12/29/19 0800 12/29/19 0852  BP: (!) 147/88 (!) 159/94    Pulse: 84 86    Resp: (!) 31 (!) 26    Temp:  (!) 97.4 F (36.3 C) 97.6 F (36.4  C)   TempSrc:  Oral Oral   SpO2: 95% 96%  94%  Weight:      Height:        Intake/Output Summary (Last 24 hours) at 12/29/2019 1217 Last data filed at 12/29/2019 1045 Gross per 24 hour  Intake 839.48 ml  Output 900 ml  Net -60.52 ml   Filed Weights   12/28/19 1654  Weight: 74.4 kg     Examination:   Physical Exam  Constitution:  Alert, cooperative, in mild to moderate distress, with labored breathing, hypoxia Psychiatric: Stable mood, communicating HEENT: Normocephalic, PERRL, otherwise with in Normal limits  Chest:Chest symmetric Cardio vascular:  S1/S2, RRR, No murmure, No Rubs or Gallops  pulmonary: Diffuse wheezing, rhonchi, mild labored breathing, negative any crackles. Abdomen: Soft, non-tender, non-distended, bowel sounds,no masses, no organomegaly Muscular skeletal: Limited exam - in bed, able to move all 4 extremities, Normal strength,  Neuro: CNII-XII intact. , normal motor and sensation, reflexes intact  Extremities: No pitting edema lower extremities, +2 pulses  Skin: Dry, warm to touch, negative for any Rashes, No open wounds Wounds: per nursing documentation    ------------------------------------------------------------------------------------------------------------------------------------------    LABs:  CBC Latest Ref Rng & Units 12/29/2019  12/28/2019 12/28/2019  WBC 4.0 - 10.5 K/uL 13.7(H) 13.1(H) 13.7(H)  Hemoglobin 13.0 - 17.0 g/dL 13.9 14.5 14.5  Hematocrit 39 - 52 % 43.1 45.4 44.4  Platelets 150 - 400 K/uL 145(L) 157 155   CMP Latest Ref Rng & Units 12/29/2019 12/28/2019 12/28/2019  Glucose 70 - 99 mg/dL 148(H) - 132(H)  BUN 8 - 23 mg/dL 22 - 20  Creatinine 0.61 - 1.24 mg/dL 0.76 1.12 0.88  Sodium 135 - 145 mmol/L 139 - 139  Potassium 3.5 - 5.1 mmol/L 4.0 - 3.8  Chloride 98 - 111 mmol/L 103 - 102  CO2 22 - 32 mmol/L 24 - 27  Calcium 8.9 - 10.3 mg/dL 8.6(L) - 8.2(L)  Total Protein 6.5 - 8.1 g/dL - - 6.6  Total Bilirubin 0.3 - 1.2 mg/dL - - 0.9  Alkaline Phos 38 - 126 U/L - - 58  AST 15 - 41 U/L - - 19  ALT 0 - 44 U/L - - 17       Micro Results Recent Results (from the past 240 hour(s))  SARS Coronavirus 2 by RT PCR (hospital order, performed in [facility] hospital lab) Nasopharyngeal Nasopharyngeal Swab     Status: None   Collection Time: 12/28/19 10:31 AM   Specimen: Nasopharyngeal Swab  Result Value Ref Range Status   SARS Coronavirus 2 NEGATIVE NEGATIVE Final    Comment: (NOTE) SARS-CoV-2 target nucleic acids are NOT DETECTED.  The SARS-CoV-2 RNA is generally detectable in upper and lower respiratory specimens during the acute phase of infection. The lowest concentration of SARS-CoV-2 viral copies this assay can detect is 250 copies / mL. A negative result does not preclude SARS-CoV-2 infection and should not be used as the sole basis for treatment or other patient management decisions.  A negative result may occur with improper specimen collection / handling, submission of specimen other than nasopharyngeal swab, presence of viral mutation(s) within the areas targeted by this assay, and inadequate number of viral copies (<250 copies / mL). A negative result must be combined with clinical observations, patient history, and epidemiological information.  Fact Sheet for Patients:     StrictlyIdeas.no  Fact Sheet for Healthcare Providers: BankingDealers.co.za  This test is not yet approved or  cleared by the [place] FDA  and has been authorized for detection and/or diagnosis of SARS-CoV-2 by FDA under an Emergency Use Authorization (EUA).  This EUA will remain in effect (meaning this test can be used) for the duration of the COVID-19 declaration under Section 564(b)(1) of the Act, 21 U.S.C. section 360bbb-3(b)(1), unless the authorization is terminated or revoked sooner.  Performed at [facility], [place] [address] [place], [place] [address]   MRSA PCR Screening     Status: None   Collection Time: 12/28/19 12:24 PM   Specimen: Nasal Mucosa; Nasopharyngeal  Result Value Ref Range Status   MRSA by PCR NEGATIVE NEGATIVE Final    Comment:        The GeneXpert MRSA Assay (FDA approved for NASAL specimens only), is one component of a comprehensive MRSA colonization surveillance program. It is not intended to diagnose MRSA infection nor to guide or monitor treatment for MRSA infections. Performed at [facility], [place] [address], [place], [place] [address]     Radiology Reports CT ANGIO CHEST PE W OR WO CONTRAST  Result Date: 12/28/2019 CLINICAL DATA:  Hypoxic respiratory failure, positive D dimer, history of pulmonary fibrosis, history of left lung cancer EXAM: CT ANGIOGRAPHY CHEST WITH CONTRAST TECHNIQUE: Multidetector CT imaging of the chest was performed using the standard protocol during bolus administration of intravenous contrast. Multiplanar CT image reconstructions and MIPs were obtained to evaluate the vascular anatomy. CONTRAST:  134m OMNIPAQUE IOHEXOL 350 MG/ML SOLN COMPARISON:  09/20/2019 FINDINGS: Cardiovascular: This is a technically adequate evaluation of the pulmonary vasculature. There are no filling defects or pulmonary emboli. The heart is unremarkable  without pericardial effusion. Unremarkable thoracic aorta with no evidence of aneurysm or dissection. Minimal atherosclerosis. Mediastinum/Nodes: Stable 13 mm precarinal lymph node. No new pathologic adenopathy. Segmental air esophagram incidentally noted, of doubtful clinical significance. Trachea and thyroid are grossly normal. Lungs/Pleura: Emphysema and subpleural scarring and fibrosis again noted. Prominent bronchiectasis and volume loss within the left upper lobe. Small left pleural effusion is again noted. The gas in the left pleural space on prior study has resolved in the interim. There is superimposed ground-glass airspace disease within the right middle and right lower lobe, new since prior study. This could reflect asymmetric edema, infection, or nonspecific inflammation. No pneumothorax. Central airways are patent. Upper Abdomen: No acute abnormality.  Stable hepatic cysts. Musculoskeletal: There are no acute displaced fractures. Chronic midthoracic wedge compression deformity without significant change. Reconstructed images demonstrate no additional findings. Review of the MIP images confirms the above findings. IMPRESSION: 1. No evidence of pulmonary embolus. 2. New ground-glass airspace disease within the right middle and right lower lobes, which could reflect asymmetric edema, infection, or nonspecific inflammation. 3. Emphysema and diffuse subpleural scarring and fibrosis. 4. Aortic Atherosclerosis (ICD10-I70.0) and Emphysema (ICD10-J43.9). Electronically Signed   By: MRanda NgoM.D.   On: 12/28/2019 19:11   DG Chest Port 1 View  Result Date: 12/28/2019 CLINICAL DATA:  Worsening shortness of breath. History of pulmonary fibrosis. EXAM: PORTABLE CHEST 1 VIEW COMPARISON:  09/19/2019 FINDINGS: The heart size appears normal. Chronic loculated left pleural effusion overlying the posterolateral left lower lobe is identified. Extensive, bilateral changes of pulmonary fibrosis identified. Similar  appearance of focal, chronic airspace density within the left upper lobe. New hazy opacity is identified within the periphery of the right midlung which may reflect superimposed pneumonia. IMPRESSION: 1. New hazy opacity within the periphery of the right midlung which may reflect superimposed pneumonia. 2. Stable chronic airspace density within  the left upper lobe secondary to chronic fibrotic changes. 3. Chronic loculated left pleural effusion. Electronically Signed   By: Kerby Moors M.D.   On: 12/28/2019 10:46   DG ESOPHAGUS W DOUBLE CM (HD)  Result Date: 12/14/2019 CLINICAL DATA:  Pharyngoesophageal dysphagia EXAM: ESOPHOGRAM / BARIUM SWALLOW / BARIUM TABLET STUDY TECHNIQUE: Combined double contrast and single contrast examination performed using effervescent crystals, thick barium liquid, and thin barium liquid. The patient was observed with fluoroscopy swallowing a 13 mm barium sulphate tablet. FLUOROSCOPY TIME:  Fluoroscopy Time:  3 minutes and 24 seconds Radiation Exposure Index (if provided by the fluoroscopic device): 491 mGy Number of Acquired Spot Images: 4 COMPARISON:  Prior study from 2017 FINDINGS: Initial barium swallows demonstrate normal pharyngeal motion with swallowing. No laryngeal penetration or aspiration. Prominent cricopharyngeal impression is noted. There is also smooth strictured narrowing near the cervicothoracic esophageal junction just above a Zenker's type diverticulum posteriorly. This is slightly larger when compared to the prior study from 2017. A few other tiny diverticuli are noted. Significant esophageal motility disorder with disruption of the primary peristaltic wave and frequent tertiary contractions and moderate esophageal stasis. No worrisome esophageal lesions are identified. There is a small hiatal hernia and a widely patent lower esophageal mucosal ring (Schatzki's ring). The 13 mm barium pill passed into the stomach without difficulty. Episodes of spontaneous and  inducible GE reflux were noted. IMPRESSION: 1. 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 2. A few other tiny diverticuli are noted and there is mild smooth narrowing near the cervicothoracic junction. 3. Significant esophageal motility disorder. 4. Small hiatal hernia and stable widely patent lower esophageal mucosal ring. Electronically Signed   By: Marijo Sanes M.D.   On: 12/14/2019 10:28    SIGNED: Deatra James, MD, FACP, FHM. [facility],  Pager (please use [email or web address] to page/text)  If 7PM-7AM, please contact night-coverage [email or web address] Ota [facility] 12/29/2019, 12:17 PM

## 2019-12-29 NOTE — Telephone Encounter (Signed)
Spoke with the pt's spouse  Pt in ICU at [facility] with PNA She wanted to let MR know this  Will route to him as [place]

## 2019-12-30 DIAGNOSIS — R319 Hematuria, unspecified: Secondary | ICD-10-CM | POA: Diagnosis present

## 2019-12-30 DIAGNOSIS — A419 Sepsis, unspecified organism: Secondary | ICD-10-CM | POA: Diagnosis present

## 2019-12-30 DIAGNOSIS — R338 Other retention of urine: Secondary | ICD-10-CM | POA: Diagnosis not present

## 2019-12-30 LAB — CBC
HCT: 42.3 % (ref 39.0–52.0)
Hemoglobin: 13.2 g/dL (ref 13.0–17.0)
MCH: 29 pg (ref 26.0–34.0)
MCHC: 31.2 g/dL (ref 30.0–36.0)
MCV: 93 fL (ref 80.0–100.0)
Platelets: 151 10*3/uL (ref 150–400)
RBC: 4.55 MIL/uL (ref 4.22–5.81)
RDW: 14.1 % (ref 11.5–15.5)
WBC: 17.6 10*3/uL — ABNORMAL HIGH (ref 4.0–10.5)
nRBC: 0 % (ref 0.0–0.2)

## 2019-12-30 LAB — BASIC METABOLIC PANEL
Anion gap: 10 (ref 5–15)
BUN: 30 mg/dL — ABNORMAL HIGH (ref 8–23)
CO2: 24 mmol/L (ref 22–32)
Calcium: 8.6 mg/dL — ABNORMAL LOW (ref 8.9–10.3)
Chloride: 105 mmol/L (ref 98–111)
Creatinine, Ser: 0.86 mg/dL (ref 0.61–1.24)
GFR calc Af Amer: >60 mL/min (ref >60)
GFR calc non Af Amer: >60 mL/min (ref >60)
Glucose, Bld: 136 mg/dL — ABNORMAL HIGH (ref 70–99)
Potassium: 4.3 mmol/L (ref 3.5–5.1)
Sodium: 139 mmol/L (ref 135–145)

## 2019-12-30 LAB — URINE CULTURE: Culture: NO GROWTH

## 2019-12-30 MED ORDER — POLYETHYLENE GLYCOL 3350 17 G PO PACK
17.0000 g | PACK | Freq: Every day | ORAL | Status: DC
  Administered 2019-12-30 – 2020-01-04 (×4): 17 g via ORAL
  Filled 2019-12-30 (×6): qty 1

## 2019-12-30 NOTE — Progress Notes (Signed)
[name], MRN:  [id number], DOB:  1931-10-28, LOS: 2 ADMISSION DATE:  12/28/2019, CONSULTATION DATE:  12/28/2019 REFERRING MD:  Dr segal, CHIEF COMPLAINT:  Shortness of breath   Brief History   86 year old gentleman with a history of pulmonary fibrosis Follows up regularly with Dr. Chase Caller Shortness of breath worsening over the last couple of days, could not even take a shower on the morning of presentation Has been coughing with initially clear phlegm in the last few days a little greenish Denies a fever, no chills Is short of breath with most activity Reformed smoker Was exposed to a lot of glue material relating to woodworking in is working life He is on OVEF and compliant Inhalers have not helped him in the past Last evaluation did not qualify for oxygen supplementation Cardiac catheterization did not show significant pulmonary hypertension  Past Medical History   Past Medical History:  Diagnosis Date  . Acute rhinitis 08/10/2017  . Acute stress disorder 10/27/2007   Qualifier: Diagnosis of  By: Arnoldo Morale MD, Balinda Quails   Overview:  Overview:  Qualifier: Diagnosis of  By: Arnoldo Morale MD, Balinda Quails  . Anxiety 07/14/2018  . Arthritis   . Bilateral epiphora 09/25/2015  . Blush 09/23/2007   Qualifier: Diagnosis of  By: Arnoldo Morale MD, Balinda Quails   Overview:  Overview:  Qualifier: Diagnosis of  By: Arnoldo Morale MD, [facility])    basal cell on right temple  . Chest pain in adult 12/16/2016  . Chronic right-sided thoracic back pain 12/16/2016  . Compression fracture of body of thoracic vertebra ([place]) 02/09/2019  . Dysphagia 07/17/2010   Qualifier: Diagnosis of  By: Trellis Paganini PA-c, Amy S   . Eustachian tube dysfunction, bilateral 08/10/2017  . Ganglion of joint 04/22/2007   Qualifier: Diagnosis of  By: Arnoldo Morale MD, Balinda Quails   . Gastroesophageal reflux disease 05/15/2016  . Headache 01/30/2016  . Hemorrhoids   . HEMORRHOIDS, INTERNAL 02/18/2007   Qualifier: Diagnosis of  By: Arnoldo Morale MD, Balinda Quails   .  IPF (idiopathic pulmonary fibrosis) ([place]) 07/14/2018   06/22/2018-CT chest with contrast- spectrum of findings suggestive of basilar predominant fibrotic interstitial lung disease with mild honeycombing asymmetrically involving the right lung with significant progression since February/2019 in September/2019 chest CT.  Findings consistent with UIP.  06/22/2018-echocardiogram- LV ejection fraction 55 to 27%, grade 1 diastolic dysfunction, PA P pressure 4  . Lateral epicondylitis (tennis elbow)   . Lingular mass 09/07/2015  . Mass of lingula of lung 09/07/2015  . Mass of lower lobe of left lung 09/07/2015  . Mediastinal adenopathy 09/07/2015  . Mediastinal lymphadenopathy 08/27/2015  . Multiple pulmonary nodules 08/27/2015  . Nausea and vomiting 05/15/2016  . Non-small cell cancer of left lung ([place]) 09/24/2015   09/13/2015: Left lower lobe lung biopsy: Overall, the histologic features, in conjunction with the positive staining for cytokeratin 903, cytokeratin 5/6, and p63 support a diagnosis of poorly differentiated squamous cell carcinoma. There is likely sufficient tumor remaining for additional studies, if requested. (JBK:ds 09/17/15)     . Non-small cell lung cancer (NSCLC) ([place]) dx'f 07/30/15  . Nuclear sclerosis of right eye 09/25/2015  . Odynophagia 11/06/2015  . Osteoarthritis 07/17/2010   Qualifier: Diagnosis of  By: Trellis Paganini PA-c, Amy S   . Persistent headaches 02/03/2017  . Pleural effusion on left 08/14/2016  . Pseudophakia, left eye 09/25/2015  . Pulmonary emphysema ([place]) 08/14/2016  . Radiation fibrosis of lung ([place]) 08/14/2016  . Shortness of breath  08/14/2016  . Soft tissue lesion of shoulder region 04/22/2007   Qualifier: Diagnosis of  By: Arnoldo Morale MD, Balinda Quails   Overview:  Overview:  Qualifier: Diagnosis of  By: Arnoldo Morale MD, Balinda Quails  . Spondylosis of cervical joint   . SPRAIN AND STRAIN OF SACROILIAC 09/06/2008   Qualifier: Diagnosis of  By: Arnoldo Morale MD, [place] [facility]   7/21  Admit  Consults:  PCCM  Procedures:  None  Significant Diagnostic Tests:  CT chest IMPRESSION: 1. No demonstrable pulmonary embolus. No thoracic aortic aneurysm. There is aortic atherosclerosis as well as foci of coronary artery calcification.  2. Underlying emphysematous change. Stable hydropneumothorax on the left. Underlying areas of fibrosis and bronchiectatic change. Airspace opacity in the left lower lobe is stable and likely represents chronic scarring and atelectatic change. No new mass or infiltrative appearing lesion in the lungs evident.  3. Stable enlarged pericarinal lymph node. No new adenopathy.  4. Hiatal hernia.  5. Stable wedge compression fracture at T6. Neoplastic etiology questioned.  Aortic Atherosclerosis (ICD10-I70.0) and Emphysema (ICD10-J43.9).  Micro Data:  SARS coronavirus negative Sputum cultures requested  Antimicrobials:  Zosyn 7/21 Doxycycline7/21>> Augmentin 7/22>>  Interim history/subjective:   States that breathing is better.  Oxygen requirements are improving. Now down to 4 L  Objective   Blood pressure (!) 112/64, pulse (!) 106, temperature 98.7 F (37.1 C), temperature source Oral, resp. rate (!) 36, height _0  (1.676 m), weight 74.4 kg, SpO2 (!) 87 %.        Intake/Output Summary (Last 24 hours) at 12/30/2019 1105 Last data filed at 12/30/2019 0600 Gross per 24 hour  Intake 529.62 ml  Output 1150 ml  Net -620.38 ml   Filed Weights   12/28/19 1654  Weight: 74.4 kg    Examination: Gen:      No acute distress HEENT:  EOMI, sclera anicteric Neck:     No masses; no thyromegaly Lungs:    Distant breath sounds, basal crackles CV:         Regular rate and rhythm; no murmurs Abd:      + bowel sounds; soft, non-tender; no palpable masses, no distension Ext:    No edema; adequate peripheral perfusion Skin:      Warm and dry; no rash Neuro: alert and oriented x 3  Resolved Hospital Problem list      Assessment & Plan:  Acute on chronic hypoxic respiratory failure Secondary to CAP, COPD. Underlying IPF, on Ofev.  Not sure if this is an IPF exacerbation.  He is responding to antibiotics, steroids, supplemental oxygen Continue bronchodilators We will get PT to work with him.  May need rehab before going home  Sepsis, present on admission, unknown organism Hemodynamically stable.  Lactic acid has normalized.  Anxiety and depression Continue Celexa  History of lung cancer Stable for the last 3 years Treated with chemotherapy and radiation treatment  Patient, wife and son updated at bedside.  Marshell Garfinkel MD [facility] Please see [email or web address] for pager details.  12/30/2019, 11:05 AM

## 2019-12-30 NOTE — Progress Notes (Signed)
PROGRESS NOTE    Patient: Jason Summers                            PCP: [name], NP                    DOB: May 27, 1932            DOA: 12/28/2019 HGD:[id number]             DOS: 12/30/2019, 1:03 PM   LOS: 2 days   Date of Service: The patient was seen and examined on 12/30/2019  Subjective:   The patient was seen and examined this morning, stable to awake alert oriented following commands pleasant. Reporting much improved shortness of breath, denies any chest pain  Overnight had an episode of urinary retention, with in and out cath mild hematuria was noted, indwelling catheter was placed, concentrated urine was noted with no overt hematuria. He reports no history of BPH, or urine retention  The patient son was present at bedside, plan of care was discussed in detail.  The patient and family confirms full CODE STATUS.  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 Chase Caller, GERD who presented to [facility] ED on 7/21 with progressively worsening shortness of breath for several weeks with acute decompensation this a.m. and hypoxia at home prompting his wife to call EMS.    Upon arrival patient was on 12 L of oxygen, was treated albuterol Atrovent DuoNeb, Solu-Medrol IV 125 mg.  O2 demand has improved to 4 L. Chest x-ray revealed RML opacity concerning for pneumonia density in LUL secondary to chronic fibrotic changes Met SIRS criteria, sepsis due to hypoxia, WBC of 13.7, blood pressure 102/62, pulse 111, RR 26 SARS-CoV-2 negative  Cultures were obtained broad-spectrum antibiotics of Rocephin and doxycycline was initiated Patient was admitted to stepdown unit.    Assessment & Plan:   Active Problems:   Acute hypoxemic respiratory failure (HCC)   Sepsis  Acute hypoxic respiratory failure suspect secondary to IPF exacerbation and CAP -Hemodynamically stable, also awake alert oriented -Patient  status is much improved from sepsis -currently afebrile, notable tensive, mildly tachypneic -Still requiring 4 L of oxygen satting 92%  On admission patient met the sepsis criteria leukocytosis 13.7, hypoxia, tachycardia, tachypnea -Lactic acid 5.0, 4.6, 1.3, 1.1 now  -Initiated on sepsis protocol, fluid resuscitation, IV antibiotics, follow-up cultures   -O2 demand has improved from 12 L to 4 L this a.m., satting 92% -Remains tachypneic, with mild labored breathing -Pulmonary critical care team following, IV steroids has been switched to p.o. taper -IV antibiotic Zosyn was switched to p.o. Augmentin  -MRSA nares negative-withholding use of vancomycin at this time -Pulmonary critical consulted, following accordingly appreciate input -Holding procalcitonin as he is on chronic steroids at home  Chest x-ray was reviewed.  Due to hypoxia, elevated D-dimer followed by CT angiogram Which revealed negative for pulmonary embolism, groundglass appearance, questionable infiltrate -SARS-CoV-2 negative   Tachycardia with elevated troponin a. Troponin 34 likely from demand ischemia b. Irregular rhythm on telemetry concerning for possible A. fib without known history - check EKG and magnesium c. Continue telemetry d. Lopressor as needed for HR> 110 e. All EKGs were personally reviewed, sinus tachycardia, with PVCs noted,  Hematuria/urinary retention -Brief episode, Foley catheter was placed at the night of 12/30/2019 -we will monitor I's and O's, -UA revealed  many bacteria, cultures pending -Hematuria has been improving, H&H stable -We will continue current antibiotics -Continue Foley catheter in anticipation of voiding trial on 12/31/2019  History of IPF a. Follows with Dr. Chase Caller b. On nintendanib as an outpatient, on hold pending PCCM recommendations  History of lung cancer -In remission -Post chemotherapy radiation therapy  Anxiety/depression -Continue Celexa,  stable    Nutritional status:       Cultures; Blood Cultures x 2 >> Urine Culture  >>>   Trying to sputum Culture >>   Antimicrobials: 12/27/2020 IV antibiotic Rocephin x1 dose. 12/28/2019 IV antibiotics Zosyn>>> 12/29/2019 12/30/2019 p.o. Augmentin >>     Consultants: Pulmonary critical care team   ------------------------------------------------------------------------------------------------------------------------------------------  DVT prophylaxis:   SCD/Compression stockings -DC'd Lovenox SQ -due to episode of hematuria  Code Status:   Code Status: Partial Code Family Communication: Son present at bedside discussed finding plan of care in detail The above findings and plan of care has been discussed with patient (and family )  in detail,  they expressed understanding and agreement of above. -Advance care planning has been discussed.   Admission status:    Status is: Inpatient  Remains inpatient appropriate because:Inpatient level of care appropriate due to severity of illness   Dispo: The patient is from: Home              Anticipated d/c is to: Home              Anticipated d/c date is: > 3 days              Patient currently is not medically stable to d/c.  In respiratory failure, meeting sepsis criteria Needing IV fluids, IV antibiotics, breathing treatments, oxygen supplementation.  Needing Subspecialty pulmonary consultation        Procedures:   No admission procedures for hospital encounter.   Antimicrobials:  Anti-infectives (From admission, onward)   Start     Dose/Rate Route Frequency Ordered Stop   12/29/19 2200  amoxicillin-clavulanate (AUGMENTIN) 875-125 MG per tablet 1 tablet     Discontinue     1 tablet Oral Every 12 hours 12/29/19 1459 01/04/20 2159   12/28/19 2000  piperacillin-tazobactam (ZOSYN) IVPB 3.375 g  Status:  Discontinued        3.375 g 12.5 mL/hr over 240 Minutes Intravenous Every 8 hours 12/28/19 1238 12/29/19 1459    12/28/19 1400  doxycycline (VIBRA-TABS) tablet 100 mg     Discontinue     100 mg Oral Every 12 hours 12/28/19 1330     12/28/19 1345  cefTRIAXone (ROCEPHIN) 1 g in sodium chloride 0.9 % 100 mL IVPB  Status:  Discontinued        1 g 200 mL/hr over 30 Minutes Intravenous Every 24 hours 12/28/19 1330 12/28/19 1335   12/28/19 1245  piperacillin-tazobactam (ZOSYN) IVPB 3.375 g        3.375 g 100 mL/hr over 30 Minutes Intravenous  Once 12/28/19 1234 12/28/19 1456   12/28/19 1100  cefTRIAXone (ROCEPHIN) 1 g in sodium chloride 0.9 % 100 mL IVPB  Status:  Discontinued        1 g 200 mL/hr over 30 Minutes Intravenous  Once 12/28/19 1057 12/28/19 1226   12/28/19 1100  doxycycline (VIBRA-TABS) tablet 100 mg  Status:  Discontinued        100 mg Oral  Once 12/28/19 1057 12/28/19 1330       Medication:  . amoxicillin-clavulanate  1 tablet Oral Q12H  . Chlorhexidine Gluconate  Cloth  6 each Topical Daily  . citalopram  10 mg Oral Daily  . doxycycline  100 mg Oral Q12H  . enoxaparin (LOVENOX) injection  40 mg Subcutaneous Q24H  . mouth rinse  15 mL Mouth Rinse BID  . melatonin  5 mg Oral QHS  . Nintedanib  150 mg Oral BID WC  . pantoprazole  40 mg Oral Daily  . predniSONE  30 mg Oral Q breakfast  . rosuvastatin  5 mg Oral Daily  . sodium chloride flush  3 mL Intravenous Q12H  . umeclidinium bromide  1 puff Inhalation Daily    acetaminophen **OR** acetaminophen, albuterol, metoprolol tartrate, polyethylene glycol   Objective:   Vitals:   12/30/19 0900 12/30/19 1000 12/30/19 1100 12/30/19 1200  BP: (!) 112/64 119/71 123/76 123/76  Pulse: (!) 106 95 94 84  Resp: (!) 36 (!) 32 (!) 32 (!) 32  Temp:      TempSrc:      SpO2: (!) 87% 91% 91% 92%  Weight:      Height:        Intake/Output Summary (Last 24 hours) at 12/30/2019 1303 Last data filed at 12/30/2019 0600 Gross per 24 hour  Intake 529.62 ml  Output 1150 ml  Net -620.38 ml   Filed Weights   12/28/19 1654  Weight: 74.4 kg      Examination:      Physical Exam:   General:  Alert, oriented, cooperative, no distress; wife at bedside  HEENT:  Normocephalic, PERRL, otherwise with in Normal limits   Neuro:  CNII-XII intact. , normal motor and sensation, reflexes intact   Lungs:    Improved shortness of breath, still visible, diffuse mild wheezing, rhonchi there for any crackles  Cardio:    S1/S2, RRR, No murmure, No Rubs or Gallops   Abdomen:   Soft, non-tender, bowel sounds active all four quadrants,  no guarding or peritoneal signs.  Muscular skeletal:  Limited exam - in bed, able to move all 4 extremities, Normal strength,  2+ pulses,  symmetric, No pitting edema  Skin:  Dry, warm to touch, negative for any Rashes, No open wounds   Wounds: per nursing documentation Foley cath in place -initiated at the night of 12/29/2019 -             ------------------------------------------------------------------------------------------------------------------------------------------    LABs:  CBC Latest Ref Rng & Units 12/30/2019 12/29/2019 12/29/2019  WBC 4.0 - 10.5 K/uL 17.6(H) - 13.7(H)  Hemoglobin 13.0 - 17.0 g/dL 13.2 14.3 13.9  Hematocrit 39 - 52 % 42.3 44.1 43.1  Platelets 150 - 400 K/uL 151 - 145(L)   CMP Latest Ref Rng & Units 12/30/2019 12/29/2019 12/28/2019  Glucose 70 - 99 mg/dL 136(H) 148(H) -  BUN 8 - 23 mg/dL 30(H) 22 -  Creatinine 0.61 - 1.24 mg/dL 0.86 0.76 1.12  Sodium 135 - 145 mmol/L 139 139 -  Potassium 3.5 - 5.1 mmol/L 4.3 4.0 -  Chloride 98 - 111 mmol/L 105 103 -  CO2 22 - 32 mmol/L 24 24 -  Calcium 8.9 - 10.3 mg/dL 8.6(L) 8.6(L) -  Total Protein 6.5 - 8.1 g/dL - - -  Total Bilirubin 0.3 - 1.2 mg/dL - - -  Alkaline Phos 38 - 126 U/L - - -  AST 15 - 41 U/L - - -  ALT 0 - 44 U/L - - -       Micro Results Recent Results (from the past 240 hour(s))  SARS Coronavirus 2 by  RT PCR (hospital order, performed in [facility] hospital lab) Nasopharyngeal Nasopharyngeal Swab     Status:  None   Collection Time: 12/28/19 10:31 AM   Specimen: Nasopharyngeal Swab  Result Value Ref Range Status   SARS Coronavirus 2 NEGATIVE NEGATIVE Final    Comment: (NOTE) SARS-CoV-2 target nucleic acids are NOT DETECTED.  The SARS-CoV-2 RNA is generally detectable in upper and lower respiratory specimens during the acute phase of infection. The lowest concentration of SARS-CoV-2 viral copies this assay can detect is 250 copies / mL. A negative result does not preclude SARS-CoV-2 infection and should not be used as the sole basis for treatment or other patient management decisions.  A negative result may occur with improper specimen collection / handling, submission of specimen other than nasopharyngeal swab, presence of viral mutation(s) within the areas targeted by this assay, and inadequate number of viral copies (<250 copies / mL). A negative result must be combined with clinical observations, patient history, and epidemiological information.  Fact Sheet for Patients:   StrictlyIdeas.no  Fact Sheet for Healthcare Providers: BankingDealers.co.za  This test is not yet approved or  cleared by the [place] FDA and has been authorized for detection and/or diagnosis of SARS-CoV-2 by FDA under an Emergency Use Authorization (EUA).  This EUA will remain in effect (meaning this test can be used) for the duration of the COVID-19 declaration under Section 564(b)(1) of the Act, 21 U.S.C. section 360bbb-3(b)(1), unless the authorization is terminated or revoked sooner.  Performed at [facility], [place] [address], [place], [place] [address]   MRSA PCR Screening     Status: None   Collection Time: 12/28/19 12:24 PM   Specimen: Nasal Mucosa; Nasopharyngeal  Result Value Ref Range Status   MRSA by PCR NEGATIVE NEGATIVE Final    Comment:        The GeneXpert MRSA Assay (FDA approved for NASAL specimens only), is one  component of a comprehensive MRSA colonization surveillance program. It is not intended to diagnose MRSA infection nor to guide or monitor treatment for MRSA infections. Performed at [facility], [place] [address] [place], [place] [address]     Radiology Reports CT ANGIO CHEST PE W OR WO CONTRAST  Result Date: 12/28/2019 CLINICAL DATA:  Hypoxic respiratory failure, positive D dimer, history of pulmonary fibrosis, history of left lung cancer EXAM: CT ANGIOGRAPHY CHEST WITH CONTRAST TECHNIQUE: Multidetector CT imaging of the chest was performed using the standard protocol during bolus administration of intravenous contrast. Multiplanar CT image reconstructions and MIPs were obtained to evaluate the vascular anatomy. CONTRAST:  149m OMNIPAQUE IOHEXOL 350 MG/ML SOLN COMPARISON:  09/20/2019 FINDINGS: Cardiovascular: This is a technically adequate evaluation of the pulmonary vasculature. There are no filling defects or pulmonary emboli. The heart is unremarkable without pericardial effusion. Unremarkable thoracic aorta with no evidence of aneurysm or dissection. Minimal atherosclerosis. Mediastinum/Nodes: Stable 13 mm precarinal lymph node. No new pathologic adenopathy. Segmental air esophagram incidentally noted, of doubtful clinical significance. Trachea and thyroid are grossly normal. Lungs/Pleura: Emphysema and subpleural scarring and fibrosis again noted. Prominent bronchiectasis and volume loss within the left upper lobe. Small left pleural effusion is again noted. The gas in the left pleural space on prior study has resolved in the interim. There is superimposed ground-glass airspace disease within the right middle and right lower lobe, new since prior study. This could reflect asymmetric edema, infection, or nonspecific inflammation. No pneumothorax. Central airways are patent. Upper Abdomen: No acute abnormality.  Stable hepatic cysts. Musculoskeletal: There are no acute displaced  fractures. Chronic midthoracic wedge compression deformity without significant change. Reconstructed images demonstrate no additional findings. Review of the MIP images confirms the above findings. IMPRESSION: 1. No evidence of pulmonary embolus. 2. New ground-glass airspace disease within the right middle and right lower lobes, which could reflect asymmetric edema, infection, or nonspecific inflammation. 3. Emphysema and diffuse subpleural scarring and fibrosis. 4. Aortic Atherosclerosis (ICD10-I70.0) and Emphysema (ICD10-J43.9). Electronically Signed   By: Randa Ngo M.D.   On: 12/28/2019 19:11   DG Chest Port 1 View  Result Date: 12/28/2019 CLINICAL DATA:  Worsening shortness of breath. History of pulmonary fibrosis. EXAM: PORTABLE CHEST 1 VIEW COMPARISON:  09/19/2019 FINDINGS: The heart size appears normal. Chronic loculated left pleural effusion overlying the posterolateral left lower lobe is identified. Extensive, bilateral changes of pulmonary fibrosis identified. Similar appearance of focal, chronic airspace density within the left upper lobe. New hazy opacity is identified within the periphery of the right midlung which may reflect superimposed pneumonia. IMPRESSION: 1. New hazy opacity within the periphery of the right midlung which may reflect superimposed pneumonia. 2. Stable chronic airspace density within the left upper lobe secondary to chronic fibrotic changes. 3. Chronic loculated left pleural effusion. Electronically Signed   By: Kerby Moors M.D.   On: 12/28/2019 10:46   DG ESOPHAGUS W DOUBLE CM (HD)  Result Date: 12/14/2019 CLINICAL DATA:  Pharyngoesophageal dysphagia EXAM: ESOPHOGRAM / BARIUM SWALLOW / BARIUM TABLET STUDY TECHNIQUE: Combined double contrast and single contrast examination performed using effervescent crystals, thick barium liquid, and thin barium liquid. The patient was observed with fluoroscopy swallowing a 13 mm barium sulphate tablet. FLUOROSCOPY TIME:   Fluoroscopy Time:  3 minutes and 24 seconds Radiation Exposure Index (if provided by the fluoroscopic device): 491 mGy Number of Acquired Spot Images: 4 COMPARISON:  Prior study from 2017 FINDINGS: Initial barium swallows demonstrate normal pharyngeal motion with swallowing. No laryngeal penetration or aspiration. Prominent cricopharyngeal impression is noted. There is also smooth strictured narrowing near the cervicothoracic esophageal junction just above a Zenker's type diverticulum posteriorly. This is slightly larger when compared to the prior study from 2017. A few other tiny diverticuli are noted. Significant esophageal motility disorder with disruption of the primary peristaltic wave and frequent tertiary contractions and moderate esophageal stasis. No worrisome esophageal lesions are identified. There is a small hiatal hernia and a widely patent lower esophageal mucosal ring (Schatzki's ring). The 13 mm barium pill passed into the stomach without difficulty. Episodes of spontaneous and inducible GE reflux were noted. IMPRESSION: 1. 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 2. A few other tiny diverticuli are noted and there is mild smooth narrowing near the cervicothoracic junction. 3. Significant esophageal motility disorder. 4. Small hiatal hernia and stable widely patent lower esophageal mucosal ring. Electronically Signed   By: Marijo Sanes M.D.   On: 12/14/2019 10:28    SIGNED: Deatra James, MD, FACP, FHM. [facility],  Pager (please use [email or web address] to page/text)  If 7PM-7AM, please contact night-coverage [email or web address] 12/30/2019, 1:03 PM

## 2019-12-30 NOTE — Telephone Encounter (Signed)
Spoke to bedside nurse, DR O and patient son Jason Summers is my neighbor). Gave son update all yesterday around 8pm. I am told is Dr Vaughan Browner rounding there today. Will talk to Dr Vaughan Browner. Please give wife my celll and I avaialble after 6pm to talk

## 2019-12-30 NOTE — Telephone Encounter (Signed)
Spoke with the pt's spouse and notified of response per MR.

## 2019-12-31 ENCOUNTER — Inpatient Hospital Stay (HOSPITAL_COMMUNITY): Payer: Medicare Other

## 2019-12-31 DIAGNOSIS — A419 Sepsis, unspecified organism: Secondary | ICD-10-CM | POA: Diagnosis not present

## 2019-12-31 DIAGNOSIS — J9601 Acute respiratory failure with hypoxia: Secondary | ICD-10-CM | POA: Diagnosis not present

## 2019-12-31 DIAGNOSIS — R319 Hematuria, unspecified: Secondary | ICD-10-CM

## 2019-12-31 DIAGNOSIS — R338 Other retention of urine: Secondary | ICD-10-CM | POA: Diagnosis not present

## 2019-12-31 LAB — BASIC METABOLIC PANEL
Anion gap: 8 (ref 5–15)
BUN: 30 mg/dL — ABNORMAL HIGH (ref 8–23)
CO2: 28 mmol/L (ref 22–32)
Calcium: 8.6 mg/dL — ABNORMAL LOW (ref 8.9–10.3)
Chloride: 104 mmol/L (ref 98–111)
Creatinine, Ser: 0.8 mg/dL (ref 0.61–1.24)
GFR calc Af Amer: >60 mL/min (ref >60)
GFR calc non Af Amer: >60 mL/min (ref >60)
Glucose, Bld: 117 mg/dL — ABNORMAL HIGH (ref 70–99)
Potassium: 4 mmol/L (ref 3.5–5.1)
Sodium: 140 mmol/L (ref 135–145)

## 2019-12-31 LAB — CBC
HCT: 41.9 % (ref 39.0–52.0)
Hemoglobin: 13.1 g/dL (ref 13.0–17.0)
MCH: 29.3 pg (ref 26.0–34.0)
MCHC: 31.3 g/dL (ref 30.0–36.0)
MCV: 93.7 fL (ref 80.0–100.0)
Platelets: 168 10*3/uL (ref 150–400)
RBC: 4.47 MIL/uL (ref 4.22–5.81)
RDW: 14.1 % (ref 11.5–15.5)
WBC: 12.6 10*3/uL — ABNORMAL HIGH (ref 4.0–10.5)
nRBC: 0 % (ref 0.0–0.2)

## 2019-12-31 MED ORDER — SODIUM CHLORIDE (PF) 0.9 % IJ SOLN
INTRAMUSCULAR | Status: AC
  Filled 2019-12-31: qty 50

## 2019-12-31 MED ORDER — IOHEXOL 300 MG/ML  SOLN
125.0000 mL | Freq: Once | INTRAMUSCULAR | Status: AC | PRN
  Administered 2019-12-31: 125 mL via INTRAVENOUS

## 2019-12-31 MED ORDER — SODIUM CHLORIDE 0.9 % IV SOLN
INTRAVENOUS | Status: AC
  Filled 2019-12-31: qty 250

## 2019-12-31 NOTE — Consult Note (Signed)
Urology Consult   Physician requesting consult: Shawna Clamp, MD  Reason for consult: Gross hematuria  History of Present Illness: Jason Summers is a 86 y.o. male with history of lung cancer, idopathic pulmonary fibrosis, currently admitted for acute hypoxic respiratory failure, for whom urology is consulted regarding new onset gross hematuria following foley catheter placement.   Patient reports that he was unable to urinate after being admitted to the hospital and a catheter was placed. Urine was noted to have blood clots and appeared dark red in color.   Patient denies any prior episode of gross hematuria. He denies a history of voiding or storage urinary symptoms, GU malignancy/trauma/surgery. He does not take any medications for urination.   He does report a prior history of infrequent cigarette smoking. He denies a family history of GU cancer.   He is not on anticoagulation.   Past Medical History:  Diagnosis Date  . Acute rhinitis 08/10/2017  . Acute stress disorder 10/27/2007   Qualifier: Diagnosis of  By: Arnoldo Morale MD, Balinda Quails   Overview:  Overview:  Qualifier: Diagnosis of  By: Arnoldo Morale MD, Balinda Quails  . Anxiety 07/14/2018  . Arthritis   . Bilateral epiphora 09/25/2015  . Blush 09/23/2007   Qualifier: Diagnosis of  By: Arnoldo Morale MD, Balinda Quails   Overview:  Overview:  Qualifier: Diagnosis of  By: Arnoldo Morale MD, [facility])    basal cell on right temple  . Chest pain in adult 12/16/2016  . Chronic right-sided thoracic back pain 12/16/2016  . Compression fracture of body of thoracic vertebra ([place]) 02/09/2019  . Dysphagia 07/17/2010   Qualifier: Diagnosis of  By: Trellis Paganini PA-c, Amy S   . Eustachian tube dysfunction, bilateral 08/10/2017  . Ganglion of joint 04/22/2007   Qualifier: Diagnosis of  By: Arnoldo Morale MD, Balinda Quails   . Gastroesophageal reflux disease 05/15/2016  . Headache 01/30/2016  . Hemorrhoids   . HEMORRHOIDS, INTERNAL 02/18/2007   Qualifier: Diagnosis of  By: Arnoldo Morale MD, Balinda Quails   . IPF  (idiopathic pulmonary fibrosis) ([place]) 07/14/2018   06/22/2018-CT chest with contrast- spectrum of findings suggestive of basilar predominant fibrotic interstitial lung disease with mild honeycombing asymmetrically involving the right lung with significant progression since February/2019 in September/2019 chest CT.  Findings consistent with UIP.  06/22/2018-echocardiogram- LV ejection fraction 55 to 32%, grade 1 diastolic dysfunction, PA P pressure 4  . Lateral epicondylitis (tennis elbow)   . Lingular mass 09/07/2015  . Mass of lingula of lung 09/07/2015  . Mass of lower lobe of left lung 09/07/2015  . Mediastinal adenopathy 09/07/2015  . Mediastinal lymphadenopathy 08/27/2015  . Multiple pulmonary nodules 08/27/2015  . Nausea and vomiting 05/15/2016  . Non-small cell cancer of left lung ([place]) 09/24/2015   09/13/2015: Left lower lobe lung biopsy: Overall, the histologic features, in conjunction with the positive staining for cytokeratin 903, cytokeratin 5/6, and p63 support a diagnosis of poorly differentiated squamous cell carcinoma. There is likely sufficient tumor remaining for additional studies, if requested. (JBK:ds 09/17/15)     . Non-small cell lung cancer (NSCLC) ([place]) dx'f 07/30/15  . Nuclear sclerosis of right eye 09/25/2015  . Odynophagia 11/06/2015  . Osteoarthritis 07/17/2010   Qualifier: Diagnosis of  By: Trellis Paganini PA-c, Amy S   . Persistent headaches 02/03/2017  . Pleural effusion on left 08/14/2016  . Pseudophakia, left eye 09/25/2015  . Pulmonary emphysema ([place]) 08/14/2016  . Radiation fibrosis of lung ([place]) 08/14/2016  . Shortness of breath 08/14/2016  .  Soft tissue lesion of shoulder region 04/22/2007   Qualifier: Diagnosis of  By: Arnoldo Morale MD, Balinda Quails   Overview:  Overview:  Qualifier: Diagnosis of  By: Arnoldo Morale MD, Balinda Quails  . Spondylosis of cervical joint   . SPRAIN AND STRAIN OF SACROILIAC 09/06/2008   Qualifier: Diagnosis of  By: Arnoldo Morale MD, Balinda Quails     Past Surgical History:  Procedure Laterality  Date  . COLONOSCOPY  2003  . KNEE ARTHROSCOPY  2006   right  . RIGHT HEART CATH N/A 10/06/2019   Procedure: RIGHT HEART CATH;  Surgeon: Nelva Bush, MD;  Location: [facility];  Service: Cardiovascular;  Laterality: N/A;    Current Hospital Medications:  Home Meds:  No current facility-administered medications on file prior to encounter.   Current Outpatient Medications on File Prior to Encounter  Medication Sig Dispense Refill  . calcium carbonate (TUMS - DOSED IN MG ELEMENTAL CALCIUM) 500 MG chewable tablet Chew 1 tablet by mouth 2 (two) times daily as needed for heartburn.     . citalopram (CELEXA) 10 MG tablet Take 1 tablet (10 mg total) by mouth daily. 90 tablet 1  . esomeprazole (NEXIUM) 40 MG capsule Take 1 capsule (40 mg total) by mouth 2 (two) times daily before a meal. 180 capsule 1  . glucosamine-chondroitin 500-400 MG tablet Take 1 tablet by mouth daily.     Marland Kitchen OFEV 150 MG CAPS Take 1 capsule (150 mg total) by mouth 2 (two) times daily. 60 capsule 11  . predniSONE (DELTASONE) 10 MG tablet TAKE 1 1/2 TABLET BY MOUTH DAILY WITH BREAKFAST (Patient taking differently: Take 15 mg by mouth daily with breakfast. ) 44 tablet 3  . PROAIR HFA 108 (90 Base) MCG/ACT inhaler INHALE 2 PUFFS BY MOUTH INTO THE LUNGS EVERY 6 HOURS AS NEEDED FOR WHEEZING OR SHORTNESS OF BREATH (Patient taking differently: Inhale 2 puffs into the lungs every 6 (six) hours as needed for wheezing or shortness of breath. ) 8.5 g 5  . rosuvastatin (CRESTOR) 5 MG tablet TAKE 1 TABLET(5 MG) BY MOUTH DAILY (Patient taking differently: Take 5 mg by mouth daily. ) 90 tablet 1  . umeclidinium bromide (INCRUSE ELLIPTA) 62.5 MCG/INH AEPB Inhale 1 puff into the lungs daily. 90 each 3     Scheduled Meds: . sodium chloride (PF)      . amoxicillin-clavulanate  1 tablet Oral Q12H  . Chlorhexidine Gluconate Cloth  6 each Topical Daily  . citalopram  10 mg Oral Daily  . doxycycline  100 mg Oral Q12H  . enoxaparin  (LOVENOX) injection  40 mg Subcutaneous Q24H  . mouth rinse  15 mL Mouth Rinse BID  . melatonin  5 mg Oral QHS  . Nintedanib  150 mg Oral BID WC  . pantoprazole  40 mg Oral Daily  . polyethylene glycol  17 g Oral Daily  . predniSONE  30 mg Oral Q breakfast  . rosuvastatin  5 mg Oral Daily  . sodium chloride flush  3 mL Intravenous Q12H  . umeclidinium bromide  1 puff Inhalation Daily   Continuous Infusions: . sodium chloride     PRN Meds:.acetaminophen **OR** acetaminophen, albuterol, metoprolol tartrate, polyethylene glycol  Allergies:  Allergies  Allergen Reactions  . Gentamicin Other (See Comments)    Made eyes red, swollen, hot feeling  . Loteprednol-Tobramycin Other (See Comments)    Other reaction(s): Other (See Comments) Made eyes Red, Swollen, Red, Warm feeling  Other reaction(s): Other (See Comments) Made eyes  Red, Swollen, Red, Warm feeling  Made eyes Red, Swollen, Red, Warm feeling     Family History  Problem Relation Age of Onset  . Hypertension Mother   . Heart disease Mother   . Coronary artery disease Other     Social History:  reports that he quit smoking about 8 years ago. His smoking use included cigarettes. He has a 12.40 pack-year smoking history. He has never used smokeless tobacco. He reports current alcohol use of about 7.0 standard drinks of alcohol per week. He reports that he does not use drugs.  ROS: A complete review of systems was performed.  All systems are negative except for pertinent findings as noted.  Physical Exam:  Vital signs in last 24 hours: Temp:  [97.6 F (36.4 C)-98.1 F (36.7 C)] 97.6 F (36.4 C) (07/24 0800) Pulse Rate:  [65-91] 79 (07/24 0900) Resp:  [17-30] 22 (07/24 0900) BP: (127-158)/(76-109) 127/109 (07/24 0900) SpO2:  [92 %-97 %] 92 % (07/24 0900) Constitutional:  Alert and oriented, No acute distress Cardiovascular: Regular rate and rhythm Respiratory: Increased respiratory effort on O2 GI: Abdomen is soft,  nontender, nondistended GU: Foley in place, urine is dark tea colored Neurologic: Grossly intact, no focal deficits Psychiatric: Normal mood and affect  Laboratory Data:  Recent Labs    12/28/19 1428 12/29/19 0239 12/29/19 1408 12/30/19 0229 12/31/19 0220  WBC 13.1* 13.7*  --  17.6* 12.6*  HGB 14.5 13.9 14.3 13.2 13.1  HCT 45.4 43.1 44.1 42.3 41.9  PLT 157 145*  --  151 168    Recent Labs    12/28/19 1428 12/29/19 0239 12/30/19 0229 12/31/19 0220  NA  --  139 139 140  K  --  4.0 4.3 4.0  CL  --  103 105 104  GLUCOSE  --  148* 136* 117*  BUN  --  22 30* 30*  CALCIUM  --  8.6* 8.6* 8.6*  CREATININE 1.12 0.76 0.86 0.80     Results for orders placed or performed during the hospital encounter of 12/28/19 (from the past 24 hour(s))  CBC     Status: Abnormal   Collection Time: 12/31/19  2:20 AM  Result Value Ref Range   WBC 12.6 (H) 4.0 - 10.5 K/uL   RBC 4.47 4.22 - 5.81 MIL/uL   Hemoglobin 13.1 13.0 - 17.0 g/dL   HCT 41.9 39 - 52 %   MCV 93.7 80.0 - 100.0 fL   MCH 29.3 26.0 - 34.0 pg   MCHC 31.3 30.0 - 36.0 g/dL   RDW 14.1 11.5 - 15.5 %   Platelets 168 150 - 400 K/uL   nRBC 0.0 0.0 - 0.2 %  Basic metabolic panel     Status: Abnormal   Collection Time: 12/31/19  2:20 AM  Result Value Ref Range   Sodium 140 135 - 145 mmol/L   Potassium 4.0 3.5 - 5.1 mmol/L   Chloride 104 98 - 111 mmol/L   CO2 28 22 - 32 mmol/L   Glucose, Bld 117 (H) 70 - 99 mg/dL   BUN 30 (H) 8 - 23 mg/dL   Creatinine, Ser 0.80 0.61 - 1.24 mg/dL   Calcium 8.6 (L) 8.9 - 10.3 mg/dL   GFR calc non Af Amer >60 >60 mL/min   GFR calc Af Amer >60 >60 mL/min   Anion gap 8 5 - 15   Recent Results (from the past 240 hour(s))  SARS Coronavirus 2 by RT PCR (hospital order, performed in [facility] hospital lab) Nasopharyngeal Nasopharyngeal Swab     Status: None   Collection Time: 12/28/19 10:31 AM   Specimen: Nasopharyngeal Swab  Result Value Ref Range Status   SARS Coronavirus 2 NEGATIVE NEGATIVE  Final    Comment: (NOTE) SARS-CoV-2 target nucleic acids are NOT DETECTED.  The SARS-CoV-2 RNA is generally detectable in upper and lower respiratory specimens during the acute phase of infection. The lowest concentration of SARS-CoV-2 viral copies this assay can detect is 250 copies / mL. A negative result does not preclude SARS-CoV-2 infection and should not be used as the sole basis for treatment or other patient management decisions.  A negative result may occur with improper specimen collection / handling, submission of specimen other than nasopharyngeal swab, presence of viral mutation(s) within the areas targeted by this assay, and inadequate number of viral copies (<250 copies / mL). A negative result must be combined with clinical observations, patient history, and epidemiological information.  Fact Sheet for Patients:   StrictlyIdeas.no  Fact Sheet for Healthcare Providers: BankingDealers.co.za  This test is not yet approved or  cleared by the [place] FDA and has been authorized for detection and/or diagnosis of SARS-CoV-2 by FDA under an Emergency Use Authorization (EUA).  This EUA will remain in effect (meaning this test can be used) for the duration of the COVID-19 declaration under Section 564(b)(1) of the Act, 21 U.S.C. section 360bbb-3(b)(1), unless the authorization is terminated or revoked sooner.  Performed at [facility], [place] [address] [place], [place] [address]   MRSA PCR Screening     Status: None   Collection Time: 12/28/19 12:24 PM   Specimen: Nasal Mucosa; Nasopharyngeal  Result Value Ref Range Status   MRSA by PCR NEGATIVE NEGATIVE Final    Comment:        The GeneXpert MRSA Assay (FDA approved for NASAL specimens only), is one component of a comprehensive MRSA colonization surveillance program. It is not intended to diagnose MRSA infection nor to guide or monitor  treatment for MRSA infections. Performed at [facility], [place] [address], [place], [place] [address]   Culture, Urine     Status: None   Collection Time: 12/29/19  6:48 PM   Specimen: Urine, Random  Result Value Ref Range Status   Specimen Description   Final    URINE, RANDOM Performed at [place] [address] [place], [place] [address]    Special Requests   Final    NONE Performed at [facility], [place] [address], [place], [place] [address]    Culture   Final    NO GROWTH Performed at [facility], [place] [address], [place], [place] [address]    Report Status 12/30/2019 FINAL  Final    Renal Function: Recent Labs    12/28/19 1031 12/28/19 1428 12/29/19 0239 12/30/19 0229 12/31/19 0220  CREATININE 0.88 1.12 0.76 0.86 0.80   Estimated Creatinine Clearance: 59.8 mL/min (by C-G formula based on SCr of 0.8 mg/dL).    Impression/Recommendation 86 y.o. male with idiopathic pulmonary fibrosis, recent episode of urinary retention and gross hematuria.  - Recommend urine culture for possible UTI, treat if indicated for complicated UTI - Recommend CT urogram for workup of gross hematuria - Will schedule patient for outpatient follow up for cystoscopy in clinic - If no evidence of bladder clots on imaging, agree with plan for repeat TOV today vs tomorrow  Carmie Kanner 12/31/2019, 12:17 PM

## 2019-12-31 NOTE — Progress Notes (Signed)
PROGRESS NOTE    Jason Summers  GLO:[id number] DOB: Jun 19, 1931 DOA: 12/28/2019 PCP: [name], NP   Brief Narrative:  This Patient is 86 year old male with past medical history of NSCLC, IPF onNintendaniband chronic steroidsand follows with Dr. Chase Caller, GERD who presented to [facility] ED on 7/21 with progressively worsening shortness of breath for several weeks with acute decompensation this a.m. and hypoxia at home prompting his wife to call EMS.   Upon arrival patient was on 12 L of oxygen, was treated albuterol,  Atrovent DuoNeb, Solu-Medrol IV 125 mg.  O2 demand has improved to 4 L. Chest x-ray revealed RML opacity concerning for pneumonia,  density in LUL secondary to chronic fibrotic changes Met SIRS criteria, sepsis due to hypoxia, WBC of 13.7, blood pressure 102/62, pulse 111, RR 26 SARS-CoV-2 negative  Cultures were obtained,  broad-spectrum antibiotics of Rocephin and doxycycline was initiated Patient was admitted to stepdown unit.  Assessment & Plan:   Principal Problem:   Sepsis ([place]) Active Problems:   Acute hypoxemic respiratory failure (HCC)   Hematuria   Acute urinary retention  # Sepsis  Acute hypoxic respiratory failure,  suspect secondary to IPF exacerbation and CAP -Hemodynamically stable. -Patient status has much improved from sepsis -currently afebrile, normotensive, mildly tachypneic -Still requiring 4 L of oxygen satting 92%.  On admission patient met the sepsis criteria leukocytosis 13.7, hypoxia, tachycardia, tachypnea -Lactic acid 5.0, 4.6, 1.3, 1.1 now  -Initiated on sepsis protocol, fluid resuscitation, IV antibiotics, follow-up cultures  -O2 demand has improved from 12 L to 4 L this a.m., satting 92% -Remains tachypneic, with mild labored breathing -Pulmonary critical care team following, IV steroids has been switched to p.o. taper -IV antibiotic Zosyn was switched to p.o. Augmentin.  -MRSA nares negative-withholding use of vancomycin at this  time -Pulmonary critical consulted, following accordingly appreciate input. -Holding procalcitonin as he is on chronic steroids at home  Chest x-ray was reviewed.  Due to hypoxia, elevated D-dimer followed by CT angiogram Which revealed negative for pulmonary embolism, groundglass appearance, questionable infiltrate -SARS-CoV-2 negative   # Tachycardia with elevated troponin. Troponin34 likely from demand ischemia Irregular rhythm on telemetry concerning for possible A. fib without known history Continue telemetry Lopressor as needed for HR>110 All EKGs were personally reviewed, sinus tachycardia, with PVCs noted,  # Hematuria/urinary retention: Brief episode, Foley catheter was placed at the night of 12/30/2019 -we will monitor I's and O's, UA revealed many bacteria, cultures pending. Hematuria has been improving, H&H stable.\ Continue current antibiotics Continue Foley catheter in anticipation of voiding trial on 12/31/2019. Urology consulted, recommended CT urogram, if no clots noted consider voiding trial  # History of IPF Follows with Dr. Chase Caller. Onnintendanib as an outpatient, on hold pending PCCM recommendations  History of lung cancer -In remission -Post chemotherapy radiation therapy  Anxiety/depression -Continue Celexa, stable   DVT prophylaxis:  SCDs due to hematuria. Code Status:  Partial Family Communication: Discussed with wife at bed side. Disposition Plan:   Dispo: The patient is from: Home  Anticipated d/c is to: Home  Anticipated d/c date is: > 3 days  Patient currently is not medically stable to d/c.  In respiratory failure, meeting sepsis criteria Needing IV fluids, IV antibiotics, breathing treatments, oxygen supplementation.  Needing Subspecialty pulmonary consultation   Consultants:    PCCM, Urology.  Procedures:   Antimicrobials:  Anti-infectives (From admission, onward)   Start      Dose/Rate Route Frequency Ordered Stop   12/29/19 2200  amoxicillin-clavulanate (AUGMENTIN) 875-125 MG  per tablet 1 tablet     Discontinue     1 tablet Oral Every 12 hours 12/29/19 1459 01/04/20 2159   12/28/19 2000  piperacillin-tazobactam (ZOSYN) IVPB 3.375 g  Status:  Discontinued        3.375 g 12.5 mL/hr over 240 Minutes Intravenous Every 8 hours 12/28/19 1238 12/29/19 1459   12/28/19 1400  doxycycline (VIBRA-TABS) tablet 100 mg     Discontinue     100 mg Oral Every 12 hours 12/28/19 1330     12/28/19 1345  cefTRIAXone (ROCEPHIN) 1 g in sodium chloride 0.9 % 100 mL IVPB  Status:  Discontinued        1 g 200 mL/hr over 30 Minutes Intravenous Every 24 hours 12/28/19 1330 12/28/19 1335   12/28/19 1245  piperacillin-tazobactam (ZOSYN) IVPB 3.375 g        3.375 g 100 mL/hr over 30 Minutes Intravenous  Once 12/28/19 1234 12/28/19 1456   12/28/19 1100  cefTRIAXone (ROCEPHIN) 1 g in sodium chloride 0.9 % 100 mL IVPB  Status:  Discontinued        1 g 200 mL/hr over 30 Minutes Intravenous  Once 12/28/19 1057 12/28/19 1226   12/28/19 1100  doxycycline (VIBRA-TABS) tablet 100 mg  Status:  Discontinued        100 mg Oral  Once 12/28/19 1057 12/28/19 1330     Subjective: Patient was seen and examined this morning, Awake, Alert, following commands. Reports shortness of breath has improved , but still easily gets short of breath with minimal exertion.   Objective: Vitals:   12/31/19 0700 12/31/19 0800 12/31/19 0819 12/31/19 0900  BP: 128/76 (!) 142/79  (!) 127/109  Pulse: 65 66  79  Resp: _0 Temp:  97.6 F (36.4 C)    TempSrc:  Oral    SpO2: 92% 97% 93% 92%  Weight:      Height:        Intake/Output Summary (Last 24 hours) at 12/31/2019 1306 Last data filed at 12/31/2019 0600 Gross per 24 hour  Intake 240 ml  Output 1100 ml  Net -860 ml   Filed Weights   12/28/19 1654  Weight: 74.4 kg    Examination:  General exam: Appears calm and comfortable  Respiratory system:  Clear to auscultation. Respiratory effort normal. Cardiovascular system: S1 & S2 heard, RRR. No JVD, murmurs, rubs, gallops or clicks. No pedal edema. Gastrointestinal system: Abdomen is nondistended, soft and nontender. No organomegaly or masses felt. Normal bowel sounds heard. Central nervous system: Alert and oriented. No focal neurological deficits. Extremities: No edema, No swelling. Skin: No rashes, lesions or ulcers Psychiatry: Judgement and insight appear normal. Mood & affect appropriate.     Data Reviewed: I have personally reviewed following labs and imaging studies  CBC: Recent Labs  Lab 12/28/19 1031 12/28/19 1031 12/28/19 1428 12/29/19 0239 12/29/19 1408 12/30/19 0229 12/31/19 0220  WBC 13.7*  --  13.1* 13.7*  --  17.6* 12.6*  NEUTROABS 11.8*  --   --   --   --   --   --   HGB 14.5   < > 14.5 13.9 14.3 13.2 13.1  HCT 44.4   < > 45.4 43.1 44.1 42.3 41.9  MCV 91.0  --  92.7 91.7  --  93.0 93.7  PLT 155  --  157 145*  --  151 168   < > = values in this interval not displayed.   Basic Metabolic Panel:  Recent Labs  Lab 12/28/19 1031 12/28/19 1428 12/29/19 0239 12/30/19 0229 12/31/19 0220  NA 139  --  139 139 140  K 3.8  --  4.0 4.3 4.0  CL 102  --  103 105 104  CO2 27  --  _0 GLUCOSE 132*  --  148* 136* 117*  BUN 20  --  22 30* 30*  CREATININE 0.88 1.12 0.76 0.86 0.80  CALCIUM 8.2*  --  8.6* 8.6* 8.6*  MG  --  2.0  --   --   --    GFR: Estimated Creatinine Clearance: 59.8 mL/min (by C-G formula based on SCr of 0.8 mg/dL). Liver Function Tests: Recent Labs  Lab 12/28/19 1031  AST 19  ALT 17  ALKPHOS 58  BILITOT 0.9  PROT 6.6  ALBUMIN 3.4*   No results for input(s): LIPASE, AMYLASE in the last 168 hours. No results for input(s): AMMONIA in the last 168 hours. Coagulation Profile: Recent Labs  Lab 12/29/19 1408  INR 1.1   Cardiac Enzymes: No results for input(s): CKTOTAL, CKMB, CKMBINDEX, TROPONINI in the last 168 hours. BNP (last 3  results) Recent Labs    09/19/19 1105  PROBNP 275   HbA1C: No results for input(s): HGBA1C in the last 72 hours. CBG: No results for input(s): GLUCAP in the last 168 hours. Lipid Profile: No results for input(s): CHOL, HDL, LDLCALC, TRIG, CHOLHDL, LDLDIRECT in the last 72 hours. Thyroid Function Tests: No results for input(s): TSH, T4TOTAL, FREET4, T3FREE, THYROIDAB in the last 72 hours. Anemia Panel: No results for input(s): VITAMINB12, FOLATE, FERRITIN, TIBC, IRON, RETICCTPCT in the last 72 hours. Sepsis Labs: Recent Labs  Lab 12/28/19 1428 12/28/19 1739 12/28/19 2340 12/29/19 0239  PROCALCITON 0.21  --   --   --   LATICACIDVEN 5.0* 4.6* 1.3 1.1    Recent Results (from the past 240 hour(s))  SARS Coronavirus 2 by RT PCR (hospital order, performed in [facility] hospital lab) Nasopharyngeal Nasopharyngeal Swab     Status: None   Collection Time: 12/28/19 10:31 AM   Specimen: Nasopharyngeal Swab  Result Value Ref Range Status   SARS Coronavirus 2 NEGATIVE NEGATIVE Final    Comment: (NOTE) SARS-CoV-2 target nucleic acids are NOT DETECTED.  The SARS-CoV-2 RNA is generally detectable in upper and lower respiratory specimens during the acute phase of infection. The lowest concentration of SARS-CoV-2 viral copies this assay can detect is 250 copies / mL. A negative result does not preclude SARS-CoV-2 infection and should not be used as the sole basis for treatment or other patient management decisions.  A negative result may occur with improper specimen collection / handling, submission of specimen other than nasopharyngeal swab, presence of viral mutation(s) within the areas targeted by this assay, and inadequate number of viral copies (<250 copies / mL). A negative result must be combined with clinical observations, patient history, and epidemiological information.  Fact Sheet for Patients:   StrictlyIdeas.no  Fact Sheet for Healthcare  Providers: BankingDealers.co.za  This test is not yet approved or  cleared by the [place] FDA and has been authorized for detection and/or diagnosis of SARS-CoV-2 by FDA under an Emergency Use Authorization (EUA).  This EUA will remain in effect (meaning this test can be used) for the duration of the COVID-19 declaration under Section 564(b)(1) of the Act, 21 U.S.C. section 360bbb-3(b)(1), unless the authorization is terminated or revoked sooner.  Performed at [facility], [address] [place],  [place] [address]   MRSA PCR Screening     Status: None   Collection Time: 12/28/19 12:24 PM   Specimen: Nasal Mucosa; Nasopharyngeal  Result Value Ref Range Status   MRSA by PCR NEGATIVE NEGATIVE Final    Comment:        The GeneXpert MRSA Assay (FDA approved for NASAL specimens only), is one component of a comprehensive MRSA colonization surveillance program. It is not intended to diagnose MRSA infection nor to guide or monitor treatment for MRSA infections. Performed at [facility], [place] [address], [place], [place] [address]   Culture, Urine     Status: None   Collection Time: 12/29/19  6:48 PM   Specimen: Urine, Random  Result Value Ref Range Status   Specimen Description   Final    URINE, RANDOM Performed at [place] [address], [place], [place] [address]    Special Requests   Final    NONE Performed at [facility], [place] [address] [place], [place] [address]    Culture   Final    NO GROWTH Performed at [facility], [place] [address] [place], [place] [address]    Report Status 12/30/2019 FINAL  Final     Radiology Studies: No results found.   Scheduled Meds: . sodium chloride (PF)      . amoxicillin-clavulanate  1 tablet Oral Q12H  . Chlorhexidine Gluconate Cloth  6 each Topical Daily  . citalopram  10 mg Oral Daily  . doxycycline  100 mg Oral  Q12H  . enoxaparin (LOVENOX) injection  40 mg Subcutaneous Q24H  . mouth rinse  15 mL Mouth Rinse BID  . melatonin  5 mg Oral QHS  . Nintedanib  150 mg Oral BID WC  . pantoprazole  40 mg Oral Daily  . polyethylene glycol  17 g Oral Daily  . predniSONE  30 mg Oral Q breakfast  . rosuvastatin  5 mg Oral Daily  . sodium chloride flush  3 mL Intravenous Q12H  . umeclidinium bromide  1 puff Inhalation Daily   Continuous Infusions: . sodium chloride       LOS: 3 days    Time spent: 35 mins.    Shawna Clamp, MD [facility]   If 7PM-7AM, please contact night-coverage

## 2019-12-31 NOTE — Progress Notes (Signed)
Pt has had two episodes of leaking at urinary catheter site. MD order given to exchange foley and large clot noted in the tubing of the old catheter. Pt immediately put out 300 cc of yellow non-bloody urine once new catheter was inserted.

## 2019-12-31 NOTE — Progress Notes (Signed)
NAMEAlf Summers, MRN:  [id number], DOB:  [birth year], LOS: 3 ADMISSION DATE:  12/28/2019, CONSULTATION DATE:  12/28/2019 REFERRING Summers:  Jason Summers, CHIEF COMPLAINT:  Shortness of breath   Brief History   86 year old gentleman with a history of pulmonary fibrosis Follows up regularly with Jason. Chase Summers Shortness of breath worsening over the last couple of days, could not even take a shower on the morning of presentation Has been coughing with initially clear phlegm in the last few days a little greenish Denies a fever, no chills Is short of breath with most activity Reformed smoker Was exposed to a lot of glue material relating to woodworking in is working life He is on OVEF and compliant Inhalers have not helped him in the past Last evaluation did not qualify for oxygen supplementation Cardiac catheterization did not show significant pulmonary hypertension  Past Medical History   Past Medical History:  Diagnosis Date   Acute rhinitis 08/10/2017   Acute stress disorder 10/27/2007   Qualifier: Diagnosis of  By: Jason Summers, Jason Summers   Overview:  Overview:  Qualifier: Diagnosis of  By: Jason Summers, Jason Summers   Anxiety 07/14/2018   Arthritis    Bilateral epiphora 09/25/2015   Blush 09/23/2007   Qualifier: Diagnosis of  By: Jason Summers, Jason Summers   Overview:  Overview:  Qualifier: Diagnosis of  By: Jason Summers, Jason Summers   Cancer [facility])    basal cell on right temple   Chest pain in adult 12/16/2016   Chronic right-sided thoracic back pain 12/16/2016   Compression fracture of body of thoracic vertebra ([place]) 02/09/2019   Dysphagia 07/17/2010   Qualifier: Diagnosis of  By: Jason Summers, Jason Summers    Eustachian tube dysfunction, bilateral 08/10/2017   Ganglion of joint 04/22/2007   Qualifier: Diagnosis of  By: Jason Summers    Gastroesophageal reflux disease 05/15/2016   Headache 01/30/2016   Hemorrhoids    HEMORRHOIDS, INTERNAL 02/18/2007   Qualifier: Diagnosis of  By: Jason Summers, Jason Summers     IPF (idiopathic pulmonary fibrosis) ([place]) 07/14/2018   06/22/2018-CT chest with contrast- spectrum of findings suggestive of basilar predominant fibrotic interstitial lung disease with mild honeycombing asymmetrically involving the right lung with significant progression since February/2019 in September/2019 chest CT.  Findings consistent with UIP.  06/22/2018-echocardiogram- LV ejection fraction 55 to 57%, grade 1 diastolic dysfunction, PA P pressure 4   Lateral epicondylitis (tennis elbow)    Lingular mass 09/07/2015   Mass of lingula of lung 09/07/2015   Mass of lower lobe of left lung 09/07/2015   Mediastinal adenopathy 09/07/2015   Mediastinal lymphadenopathy 08/27/2015   Multiple pulmonary nodules 08/27/2015   Nausea and vomiting 05/15/2016   Non-small cell cancer of left lung ([place]) 09/24/2015   09/13/2015: Left lower lobe lung biopsy: Overall, the histologic features, in conjunction with the positive staining for cytokeratin 903, cytokeratin 5/6, and p63 support a diagnosis of poorly differentiated squamous cell carcinoma. There is likely sufficient tumor remaining for additional studies, if requested. (JBK:ds 09/17/15)      Non-small cell lung cancer (NSCLC) ([place]) dx'f 07/30/15   Nuclear sclerosis of right eye 09/25/2015   Odynophagia 11/06/2015   Osteoarthritis 07/17/2010   Qualifier: Diagnosis of  By: Jason Summers, Jason Summers    Persistent headaches 02/03/2017   Pleural effusion on left 08/14/2016   Pseudophakia, left eye 09/25/2015   Pulmonary emphysema ([place]) 08/14/2016   Radiation fibrosis of lung ([place]) 08/14/2016   Shortness of breath  08/14/2016   Soft tissue lesion of shoulder region 04/22/2007   Qualifier: Diagnosis of  By: Jason Summers, Jason Summers   Overview:  Overview:  Qualifier: Diagnosis of  By: Jason Summers, Jason Summers   Spondylosis of cervical joint    SPRAIN AND STRAIN OF SACROILIAC 09/06/2008   Qualifier: Diagnosis of  By: Jason Summers, [place] [facility]   7/21  Admit 7/24 Urology consulted for gross hematuria  Consults:  PCCM  Procedures:  None  Significant Diagnostic Tests:  CT chest 7/21-no PE, underlying emphysema, New groundglass opacities in the right upper lobe and lower lobe.  I have reviewed the images personally.   Micro Data:  SARS coronavirus negative Sputum cultures requested  Antimicrobials:  Zosyn 7/21 Doxycycline7/21>> Augmentin 7/22>>  Interim history/subjective:   Breathing is stable.  Continues on 4 L supplemental oxygen  Objective   Blood pressure (!) 144/43, pulse 89, temperature 97.6 F (36.4 C), temperature source Oral, resp. rate 19, height _0  (1.676 m), weight 74.4 kg, SpO2 95 %.        Intake/Output Summary (Last 24 hours) at 12/31/2019 1536 Last data filed at 12/31/2019 0600 Gross per 24 hour  Intake 240 ml  Output 900 ml  Net -660 ml   Filed Weights   12/28/19 1654  Weight: 74.4 kg    Examination: Gen:      No acute distress HEENT:  EOMI, sclera anicteric Neck:     No masses; no thyromegaly Lungs:   Basal crackles, distant breath sounds CV:         Regular rate and rhythm; no murmurs Abd:      + bowel sounds; soft, non-tender; no palpable masses, no distension Ext:    No edema; adequate peripheral perfusion Skin:      Warm and dry; no rash Neuro: alert and oriented x 3 Psych: normal mood and affect  Resolved Hospital Problem list     Assessment & Plan:  Acute on chronic hypoxic respiratory failure Secondary to CAP, COPD. Underlying IPF, on Ofev. Continue antibiotics steroids, supplemental oxygen Bronchodilators May need rehab, supplemental oxygen on discharge.  Sepsis, present on admission, unknown organism Hemodynamically stable.  Lactic acid has normalized.  Anxiety and depression Continue Celexa  History of lung cancer Stable for the last 3 years Treated with chemotherapy and radiation treatment  Hematuria Urology on board CT abdomen pelvis pending.  Jason Garfinkel Summers [facility] Please see [email or web address] for pager details.  12/31/2019, 3:36 PM

## 2019-12-31 NOTE — Progress Notes (Signed)
PT Cancellation Note  Patient Details Name: Jason Summers MRN: [id number] DOB: 03-May-1932   Cancelled Treatment:     PT order received but eval deferred this date, pt in CT.   Will follow.   Chaniyah Jahr 12/31/2019, 3:18 PM

## 2020-01-01 DIAGNOSIS — A419 Sepsis, unspecified organism: Secondary | ICD-10-CM | POA: Diagnosis not present

## 2020-01-01 LAB — CBC
HCT: 43.4 % (ref 39.0–52.0)
Hemoglobin: 13.8 g/dL (ref 13.0–17.0)
MCH: 29.5 pg (ref 26.0–34.0)
MCHC: 31.8 g/dL (ref 30.0–36.0)
MCV: 92.7 fL (ref 80.0–100.0)
Platelets: 147 10*3/uL — ABNORMAL LOW (ref 150–400)
RBC: 4.68 MIL/uL (ref 4.22–5.81)
RDW: 13.9 % (ref 11.5–15.5)
WBC: 9.4 10*3/uL (ref 4.0–10.5)
nRBC: 0 % (ref 0.0–0.2)

## 2020-01-01 LAB — BASIC METABOLIC PANEL
Anion gap: 9 (ref 5–15)
BUN: 25 mg/dL — ABNORMAL HIGH (ref 8–23)
CO2: 27 mmol/L (ref 22–32)
Calcium: 8.6 mg/dL — ABNORMAL LOW (ref 8.9–10.3)
Chloride: 103 mmol/L (ref 98–111)
Creatinine, Ser: 0.78 mg/dL (ref 0.61–1.24)
GFR calc Af Amer: >60 mL/min (ref >60)
GFR calc non Af Amer: >60 mL/min (ref >60)
Glucose, Bld: 100 mg/dL — ABNORMAL HIGH (ref 70–99)
Potassium: 4.3 mmol/L (ref 3.5–5.1)
Sodium: 139 mmol/L (ref 135–145)

## 2020-01-01 LAB — MAGNESIUM: Magnesium: 2.4 mg/dL (ref 1.7–2.4)

## 2020-01-01 LAB — PHOSPHORUS: Phosphorus: 3 mg/dL (ref 2.5–4.6)

## 2020-01-01 NOTE — Progress Notes (Signed)
PROGRESS NOTE    Jason Summers  FXJ:[id number] DOB: [birth year] DOA: 12/28/2019 PCP: [name], NP   Brief Narrative:  This Patient is 86 year old male with past medical history of NSCLC, IPF onNintendaniband chronic steroidsand follows with Dr. Chase Caller, GERD who presented to [facility] ED on 7/21 with progressively worsening shortness of breath for several weeks with acute decompensation this a.m. and hypoxia at home prompting his wife to call EMS.   Upon arrival patient was on 12 L of oxygen, was treated albuterol,  Atrovent DuoNeb, Solu-Medrol IV 125 mg.  O2 demand has improved to 4 L. Chest x-ray revealed RML opacity concerning for pneumonia,  density in LUL secondary to chronic fibrotic changes Met SIRS criteria, sepsis due to hypoxia, WBC of 13.7, blood pressure 102/62, pulse 111, RR 26 SARS-CoV-2 negative . Cultures were obtained,  broad-spectrum antibiotics of Rocephin and doxycycline was initiated Patient was admitted to stepdown unit.  Patient continued to have significant hypoxia with exertion.  Assessment & Plan:   Principal Problem:   Sepsis ([place]) Active Problems:   Acute hypoxemic respiratory failure (HCC)   Hematuria   Acute urinary retention  # Sepsis  Acute hypoxic respiratory failure,  suspect secondary to IPF exacerbation and CAP -Hemodynamically stable. -Patient status has much improved from sepsis -currently afebrile, normotensive, mildly tachypneic -Still requiring 4 L of oxygen satting 92% which goes up briefly to 5 L/m after exertion.  On admission patient met the sepsis criteria( leukocytosis 13.7, hypoxia, tachycardia, tachypnea) -Lactic acid 5.0, 4.6, 1.3, 1.1 now  -Initiated on sepsis protocol, fluid resuscitation, IV antibiotics, follow-up cultures  -O2 demand has improved from 12 L to 4 L this a.m., satting 92% -Remains tachypneic, with mild labored breathing -Pulmonary critical care team following, IV steroids has been switched to p.o. taper -IV  antibiotic Zosyn was switched to p.o. Augmentin.  -MRSA nares negative-withholding use of vancomycin at this time -Pulmonary critical consulted, following accordingly appreciate input. -Holding procalcitonin as he is on chronic steroids at home  Chest x-ray was reviewed.  Due to hypoxia, elevated D-dimer followed by CT angiogram Which revealed negative for pulmonary embolism, groundglass appearance, questionable infiltrate -SARS-CoV-2 negative  # Tachycardia with slightly elevated troponin. Troponin34 likely from demand ischemia Irregular rhythm on telemetry concerning for possible A. fib without known history Continue telemetry Lopressor as needed for HR>110. All EKGs were personally reviewed, sinus tachycardia, with PVCs noted,  # Hematuria/urinary retention: Brief episode, Foley catheter was placed at the night of 12/30/2019 -we will monitor I's and O's, UA revealed many bacteria, cultures pending. Hematuria has been improving, H&H stable.\ Continue current antibiotics, transition to empiric p.o. antibiotics for 10 days Continue Foley catheter in anticipation of voiding trial on 01/02/2020. Urology consulted, recommended CT urogram, shoed non obstructing renal calculi Urine culture : No growth so far, Flomax started.  # History of IPF Follows with Dr. Chase Caller. Onnintendanib as an outpatient, on hold pending PCCM recommendations  History of lung cancer -In remission -Post chemotherapy radiation therapy  Anxiety/depression -Continue Celexa, stable   DVT prophylaxis:  SCDs due to hematuria. Code Status:  Partial Family Communication: Discussed with wife at bed side. Disposition Plan:   Dispo: The patient is from: Home  Anticipated d/c is to: Home  Anticipated d/c date is: > 3 days  Patient currently is not medically stable to d/c.  In respiratory failure, meeting sepsis criteria Needing IV fluids, IV antibiotics, breathing  treatments, oxygen supplementation.  Needing Subspecialty pulmonary consultation   Consultants:  PCCM, Urology.  Procedures:   Antimicrobials:  Anti-infectives (From admission, onward)   Start     Dose/Rate Route Frequency Ordered Stop   12/29/19 2200  amoxicillin-clavulanate (AUGMENTIN) 875-125 MG per tablet 1 tablet     Discontinue     1 tablet Oral Every 12 hours 12/29/19 1459 01/04/20 2159   12/28/19 2000  piperacillin-tazobactam (ZOSYN) IVPB 3.375 g  Status:  Discontinued        3.375 g 12.5 mL/hr over 240 Minutes Intravenous Every 8 hours 12/28/19 1238 12/29/19 1459   12/28/19 1400  doxycycline (VIBRA-TABS) tablet 100 mg     Discontinue     100 mg Oral Every 12 hours 12/28/19 1330     12/28/19 1345  cefTRIAXone (ROCEPHIN) 1 g in sodium chloride 0.9 % 100 mL IVPB  Status:  Discontinued        1 g 200 mL/hr over 30 Minutes Intravenous Every 24 hours 12/28/19 1330 12/28/19 1335   12/28/19 1245  piperacillin-tazobactam (ZOSYN) IVPB 3.375 g        3.375 g 100 mL/hr over 30 Minutes Intravenous  Once 12/28/19 1234 12/28/19 1456   12/28/19 1100  cefTRIAXone (ROCEPHIN) 1 g in sodium chloride 0.9 % 100 mL IVPB  Status:  Discontinued        1 g 200 mL/hr over 30 Minutes Intravenous  Once 12/28/19 1057 12/28/19 1226   12/28/19 1100  doxycycline (VIBRA-TABS) tablet 100 mg  Status:  Discontinued        100 mg Oral  Once 12/28/19 1057 12/28/19 1330     Subjective: Patient was seen and examined this morning, Awake, Alert, following commands. Reports shortness of breath has improved , but still easily gets short of breath with minimal exertion.   Objective: Vitals:   01/01/20 0808 01/01/20 0900 01/01/20 1000 01/01/20 1100  BP:  (!) 140/72 103/80 (!) 134/78  Pulse:  86 84 80  Resp:  18 (!) 35 21  Temp: 97.9 F (36.6 C)     TempSrc: Oral     SpO2:  91% 96% 96%  Weight:      Height:        Intake/Output Summary (Last 24 hours) at 01/01/2020 1219 Last data filed at 01/01/2020  0550 Gross per 24 hour  Intake --  Output 1105 ml  Net -1105 ml   Filed Weights   12/28/19 1654  Weight: 74.4 kg    Examination:  General exam: Appears calm and comfortable  Respiratory system: Clear to auscultation. Respiratory effort normal. Cardiovascular system: S1 & S2 heard, RRR. No JVD, murmurs, rubs, gallops or clicks. No pedal edema. Gastrointestinal system: Abdomen is nondistended, soft and nontender. No organomegaly or masses felt. Normal bowel sounds heard. Central nervous system: Alert and oriented. No focal neurological deficits. Extremities: No edema, No swelling. Skin: No rashes, lesions or ulcers Psychiatry: Judgement and insight appear normal. Mood & affect appropriate.     Data Reviewed: I have personally reviewed following labs and imaging studies  CBC: Recent Labs  Lab 12/28/19 1031 12/28/19 1031 12/28/19 1428 12/28/19 1428 12/29/19 0239 12/29/19 1408 12/30/19 0229 12/31/19 0220 01/01/20 0244  WBC 13.7*   < > 13.1*  --  13.7*  --  17.6* 12.6* 9.4  NEUTROABS 11.8*  --   --   --   --   --   --   --   --   HGB 14.5   < > 14.5   < > 13.9 14.3 13.2 13.1 13.8  HCT 44.4   < > 45.4   < > 43.1 44.1 42.3 41.9 43.4  MCV 91.0   < > 92.7  --  91.7  --  93.0 93.7 92.7  PLT 155   < > 157  --  145*  --  151 168 147*   < > = values in this interval not displayed.   Basic Metabolic Panel: Recent Labs  Lab 12/28/19 1031 12/28/19 1031 12/28/19 1428 12/29/19 0239 12/30/19 0229 12/31/19 0220 01/01/20 0244  NA 139  --   --  139 139 140 139  K 3.8  --   --  4.0 4.3 4.0 4.3  CL 102  --   --  103 105 104 103  CO2 27  --   --  _0 GLUCOSE 132*  --   --  148* 136* 117* 100*  BUN 20  --   --  22 30* 30* 25*  CREATININE 0.88   < > 1.12 0.76 0.86 0.80 0.78  CALCIUM 8.2*  --   --  8.6* 8.6* 8.6* 8.6*  MG  --   --  2.0  --   --   --  2.4  PHOS  --   --   --   --   --   --  3.0   < > = values in this interval not displayed.   GFR: Estimated Creatinine  Clearance: 59.8 mL/min (by C-G formula based on SCr of 0.78 mg/dL). Liver Function Tests: Recent Labs  Lab 12/28/19 1031  AST 19  ALT 17  ALKPHOS 58  BILITOT 0.9  PROT 6.6  ALBUMIN 3.4*   No results for input(s): LIPASE, AMYLASE in the last 168 hours. No results for input(s): AMMONIA in the last 168 hours. Coagulation Profile: Recent Labs  Lab 12/29/19 1408  INR 1.1   Cardiac Enzymes: No results for input(s): CKTOTAL, CKMB, CKMBINDEX, TROPONINI in the last 168 hours. BNP (last 3 results) Recent Labs    09/19/19 1105  PROBNP 275   HbA1C: No results for input(s): HGBA1C in the last 72 hours. CBG: No results for input(s): GLUCAP in the last 168 hours. Lipid Profile: No results for input(s): CHOL, HDL, LDLCALC, TRIG, CHOLHDL, LDLDIRECT in the last 72 hours. Thyroid Function Tests: No results for input(s): TSH, T4TOTAL, FREET4, T3FREE, THYROIDAB in the last 72 hours. Anemia Panel: No results for input(s): VITAMINB12, FOLATE, FERRITIN, TIBC, IRON, RETICCTPCT in the last 72 hours. Sepsis Labs: Recent Labs  Lab 12/28/19 1428 12/28/19 1739 12/28/19 2340 12/29/19 0239  PROCALCITON 0.21  --   --   --   LATICACIDVEN 5.0* 4.6* 1.3 1.1    Recent Results (from the past 240 hour(s))  SARS Coronavirus 2 by RT PCR (hospital order, performed in [facility] hospital lab) Nasopharyngeal Nasopharyngeal Swab     Status: None   Collection Time: 12/28/19 10:31 AM   Specimen: Nasopharyngeal Swab  Result Value Ref Range Status   SARS Coronavirus 2 NEGATIVE NEGATIVE Final    Comment: (NOTE) SARS-CoV-2 target nucleic acids are NOT DETECTED.  The SARS-CoV-2 RNA is generally detectable in upper and lower respiratory specimens during the acute phase of infection. The lowest concentration of SARS-CoV-2 viral copies this assay can detect is 250 copies / mL. A negative result does not preclude SARS-CoV-2 infection and should not be used as the sole basis for treatment or other patient  management decisions.  A negative result may occur with improper specimen collection /  handling, submission of specimen other than nasopharyngeal swab, presence of viral mutation(s) within the areas targeted by this assay, and inadequate number of viral copies (<250 copies / mL). A negative result must be combined with clinical observations, patient history, and epidemiological information.  Fact Sheet for Patients:   StrictlyIdeas.no  Fact Sheet for Healthcare Providers: BankingDealers.co.za  This test is not yet approved or  cleared by the [place] FDA and has been authorized for detection and/or diagnosis of SARS-CoV-2 by FDA under an Emergency Use Authorization (EUA).  This EUA will remain in effect (meaning this test can be used) for the duration of the COVID-19 declaration under Section 564(b)(1) of the Act, 21 U.S.C. section 360bbb-3(b)(1), unless the authorization is terminated or revoked sooner.  Performed at [facility], [place] [address] [place], [place] [address]   MRSA PCR Screening     Status: None   Collection Time: 12/28/19 12:24 PM   Specimen: Nasal Mucosa; Nasopharyngeal  Result Value Ref Range Status   MRSA by PCR NEGATIVE NEGATIVE Final    Comment:        The GeneXpert MRSA Assay (FDA approved for NASAL specimens only), is one component of a comprehensive MRSA colonization surveillance program. It is not intended to diagnose MRSA infection nor to guide or monitor treatment for MRSA infections. Performed at [facility], [place] [address], [place], [place] [address]   Culture, Urine     Status: None   Collection Time: 12/29/19  6:48 PM   Specimen: Urine, Random  Result Value Ref Range Status   Specimen Description   Final    URINE, RANDOM Performed at [place] [address], [place], [place] [address]    Special Requests   Final     NONE Performed at [facility], [place] [address] [place], [place] [address]    Culture   Final    NO GROWTH Performed at [facility], [place] [address] [place], [place] [address]    Report Status 12/30/2019 FINAL  Final     Radiology Studies: CT HEMATURIA WORKUP  Result Date: 12/31/2019 CLINICAL DATA:  Gross hematuria, urinary retention, history of IPF EXAM: CT ABDOMEN AND PELVIS WITHOUT AND WITH CONTRAST TECHNIQUE: Multidetector CT imaging of the abdomen and pelvis was performed following the standard protocol before and following the bolus administration of intravenous contrast. CONTRAST:  132m OMNIPAQUE IOHEXOL 300 MG/ML  SOLN COMPARISON:  CT chest angiogram, 12/28/2019, PET-CT, 07/11/2018 FINDINGS: Lower chest: No acute abnormality. Small, chronic, loculated left pleural effusion. Fibrotic change and honeycombing at the bilateral lung bases. Hepatobiliary: No solid liver abnormality is seen. No gallstones, gallbladder wall thickening, or biliary dilatation. Pancreas: Unremarkable. No pancreatic ductal dilatation or surrounding inflammatory changes. Spleen: Normal in size without significant abnormality. Adrenals/Urinary Tract: Adrenal glands are unremarkable. Small nonobstructive calculi of the midportion of the right kidney (series 8, image 46). The urinary bladder is thickened although decompressed by Foley catheter, with small diverticula of the dome. Stomach/Bowel: Stomach is within normal limits. Appendix appears normal. No evidence of bowel wall thickening, distention, or inflammatory changes. Sigmoid diverticula. Fluid throughout the colon to the rectum. Vascular/Lymphatic: Aortic atherosclerosis. 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 No enlarged abdominal or pelvic lymph nodes. Reproductive: No mass or other significant abnormality. Other: No abdominal wall hernia or abnormality. No abdominopelvic ascites. Musculoskeletal: No  acute or significant osseous findings. IMPRESSION: 1. Small nonobstructive calculi of the  midportion of the right kidney. No other evidence of urinary tract calculus or hydronephrosis. No evidence of mass or filling defect of the urinary tract on delayed phase imaging. 2. The urinary bladder is thickened although decompressed by Foley catheter, with small diverticula of the dome. Findings are most consistent with sequelae of chronic outlet obstruction. 3. Fibrosis of the included bilateral lung bases, in keeping with prior CT examinations of the chest and history of IPF. 4. 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 Aortic Atherosclerosis (ICD10-I70.0). Electronically Signed   By: Eddie Candle M.D.   On: 12/31/2019 16:12     Scheduled Meds: . amoxicillin-clavulanate  1 tablet Oral Q12H  . Chlorhexidine Gluconate Cloth  6 each Topical Daily  . citalopram  10 mg Oral Daily  . doxycycline  100 mg Oral Q12H  . enoxaparin (LOVENOX) injection  40 mg Subcutaneous Q24H  . mouth rinse  15 mL Mouth Rinse BID  . melatonin  5 mg Oral QHS  . Nintedanib  150 mg Oral BID WC  . pantoprazole  40 mg Oral Daily  . polyethylene glycol  17 g Oral Daily  . predniSONE  30 mg Oral Q breakfast  . rosuvastatin  5 mg Oral Daily  . sodium chloride flush  3 mL Intravenous Q12H  . umeclidinium bromide  1 puff Inhalation Daily   Continuous Infusions:    LOS: 4 days    Time spent: 25 mins.    Shawna Clamp, MD [facility]   If 7PM-7AM, please contact night-coverage

## 2020-01-01 NOTE — Evaluation (Signed)
Physical Therapy Evaluation Patient Details Name: Jason Summers MRN: [id number] DOB: [birth year] Today's Date: 01/01/2020   History of Present Illness  Pt admitted with acute hypoxic respiratory failuure 2* CAP and exacerbation of Idiopathic pulmonary fibrosis.  Pt with hx of Lung CA  Clinical Impression  Pt admitted as above and presenting with functional mobility limitations 2* generalized weakness, ambulatory balance deficits and limited endurance.  This date, pt ambulating in hall with assist and desat to 83% on 6L O2 - HR to 104.  Pt should progress to dc home with need for RW and HHPT dependent on acute stay progress.    Follow Up Recommendations Home health PT    Equipment Recommendations  Rolling walker with 5" wheels    Recommendations for Other Services OT consult     Precautions / Restrictions Precautions Precautions: Fall;Other (comment) Precaution Comments: monitor SaO2 Restrictions Weight Bearing Restrictions: No      Mobility  Bed Mobility Overal bed mobility: Needs Assistance Bed Mobility: Supine to Sit     Supine to sit: HOB elevated;Min assist        Transfers Overall transfer level: Needs assistance Equipment used: Rolling walker (2 wheeled) Transfers: Sit to/from Stand Sit to Stand: Min assist;Mod assist         General transfer comment: Cues for use of UEs to self assist.  Physical assist to bring wt up and fwd and to balance in intial standing  Ambulation/Gait Ambulation/Gait assistance: Min assist;+2 physical assistance;+2 safety/equipment Gait Distance (Feet): 200 Feet Assistive device: Rolling walker (2 wheeled) Gait Pattern/deviations: Step-through pattern;Decreased step length - right;Decreased step length - left;Shuffle;Trunk flexed Gait velocity: decr   General Gait Details: Cues for posture and position from RW.  Increased time with standing rest breaks 2* O2 desat  Stairs            Wheelchair Mobility    Modified Rankin  (Stroke Patients Only)       Balance Overall balance assessment: Needs assistance Sitting-balance support: No upper extremity supported;Feet supported Sitting balance-Leahy Scale: Fair     Standing balance support: Bilateral upper extremity supported Standing balance-Leahy Scale: Poor                               Pertinent Vitals/Pain Pain Assessment: No/denies pain    Home Living Family/patient expects to be discharged to:: Private residence Living Arrangements: Spouse/significant other Available Help at Discharge: Family Type of Home: House Home Access: Stairs to enter Entrance Stairs-Rails: None Technical brewer of Steps: 2 Home Layout: One level Home Equipment: Cane - single point;Crutches      Prior Function Level of Independence: Independent               Hand Dominance        Extremity/Trunk Assessment   Upper Extremity Assessment Upper Extremity Assessment: Generalized weakness    Lower Extremity Assessment Lower Extremity Assessment: Generalized weakness       Communication   Communication: No difficulties  Cognition Arousal/Alertness: Awake/alert Behavior During Therapy: WFL for tasks assessed/performed Overall Cognitive Status: Within Functional Limits for tasks assessed                                        General Comments      Exercises     Assessment/Plan    PT Assessment Patient needs continued  PT services  PT Problem List Decreased strength;Decreased range of motion;Decreased activity tolerance;Decreased balance;Decreased mobility;Decreased knowledge of use of DME       PT Treatment Interventions DME instruction;Gait training;Stair training;Functional mobility training;Therapeutic activities;Therapeutic exercise;Balance training;Patient/family education    PT Goals (Current goals can be found in the Care Plan section)  Acute Rehab PT Goals Patient Stated Goal: Regain IND PT Goal  Formulation: With patient Time For Goal Achievement: 01/15/20 Potential to Achieve Goals: Good    Frequency Min 3X/week   Barriers to discharge        Co-evaluation               AM-PAC PT "6 Clicks" Mobility  Outcome Measure Help needed turning from your back to your side while in a flat bed without using bedrails?: A Little Help needed moving from lying on your back to sitting on the side of a flat bed without using bedrails?: A Little Help needed moving to and from a bed to a chair (including a wheelchair)?: A Little Help needed standing up from a chair using your arms (e.g., wheelchair or bedside chair)?: A Little Help needed to walk in hospital room?: A Little Help needed climbing 3-5 steps with a railing? : A Lot 6 Click Score: 17    End of Session Equipment Utilized During Treatment: Gait belt;Oxygen Activity Tolerance: Patient tolerated treatment well Patient left: in chair;with call bell/phone within reach;with chair alarm set;with family/visitor present Nurse Communication: Mobility status PT Visit Diagnosis: Difficulty in walking, not elsewhere classified (R26.2);Muscle weakness (generalized) (M62.81)    Time: 4818-5909 PT Time Calculation (min) (ACUTE ONLY): 25 min   Charges:   PT Evaluation $PT Eval Low Complexity: 1 Low PT Treatments $Gait Training: 8-22 mins        [place] Pager [phone] Office [phone]   Ulisses Vondrak 01/01/2020, 1:40 PM

## 2020-01-01 NOTE — Progress Notes (Signed)
Urology Progress Note:  Subjective: Urine clear yellow this morning. Catheter exchanged overnight due to small clot clogging drainage.  Hb stable this morning at 13.8. 1.2 L urine output. CT urogram showed non-obstructing right renal stones and multiple small bladder diverticulae.  Urine culture no growth  Physical Exam:  Vital signs in last 24 hours: Temp:  [97.6 F (36.4 C)-98.2 F (36.8 C)] 97.9 F (36.6 C) (07/25 0808) Pulse Rate:  [67-106] 67 (07/25 0400) Resp:  [19-38] 21 (07/25 0400) BP: (139-162)/(43-96) 147/88 (07/25 0400) SpO2:  [89 %-98 %] 97 % (07/25 0725) Constitutional:  Alert and oriented, No acute distress Respiratory: Increased respiratory effort on O2 GU: Foley in place, urine is clear ellow Neurologic: Grossly intact, no focal deficits Psychiatric: Normal mood and affect  Laboratory Data:   Recent Labs    12/28/19 1031 12/28/19 1428 12/29/19 0239 12/30/19 0229 12/31/19 0220 01/01/20 0244  CREATININE 0.88 1.12 0.76 0.86 0.80 0.78   Estimated Creatinine Clearance: 59.8 mL/min (by C-G formula based on SCr of 0.78 mg/dL).  Radiology: CT urogram reviewed personally.   Impression/Recommendation 86 y.o. male with idiopathic pulmonary fibrosis, recent episode of urinary retention and gross hematuria. CT urogram with right non-obstructing renal stones and multiple small bladder diverticula, consistent with outlet obstruction. Urine culture no growth.   - Recommend starting patient on tamsulosin 0.35m daily, to be continued on discharge.  - Recommend foley removal, trial of void - Will schedule patient for outpatient follow up for cystoscopy in clinic   [facility]7/25/2021, 9:11 AM

## 2020-01-02 LAB — CBC
HCT: 44 % (ref 39.0–52.0)
Hemoglobin: 14 g/dL (ref 13.0–17.0)
MCH: 29.2 pg (ref 26.0–34.0)
MCHC: 31.8 g/dL (ref 30.0–36.0)
MCV: 91.7 fL (ref 80.0–100.0)
Platelets: 150 10*3/uL (ref 150–400)
RBC: 4.8 MIL/uL (ref 4.22–5.81)
RDW: 13.3 % (ref 11.5–15.5)
WBC: 8.9 10*3/uL (ref 4.0–10.5)
nRBC: 0 % (ref 0.0–0.2)

## 2020-01-02 LAB — BASIC METABOLIC PANEL
Anion gap: 10 (ref 5–15)
BUN: 27 mg/dL — ABNORMAL HIGH (ref 8–23)
CO2: 28 mmol/L (ref 22–32)
Calcium: 8.5 mg/dL — ABNORMAL LOW (ref 8.9–10.3)
Chloride: 99 mmol/L (ref 98–111)
Creatinine, Ser: 0.76 mg/dL (ref 0.61–1.24)
GFR calc Af Amer: >60 mL/min (ref >60)
GFR calc non Af Amer: >60 mL/min (ref >60)
Glucose, Bld: 100 mg/dL — ABNORMAL HIGH (ref 70–99)
Potassium: 4 mmol/L (ref 3.5–5.1)
Sodium: 137 mmol/L (ref 135–145)

## 2020-01-02 NOTE — Progress Notes (Addendum)
PROGRESS NOTE    Jason Summers  ZOX:[id number] DOB: 27-Oct-1931 DOA: 12/28/2019 PCP: [name], NP   Brief Narrative:  This Patient is 86 year old male with past medical history of NSCLC, IPF onNintendaniband chronic steroidsand follows with Dr. Chase Caller, GERD who presented to [facility] ED on 7/21 with progressively worsening shortness of breath for several weeks with acute decompensation this a.m. and hypoxia at home prompting his wife to call EMS.   Upon arrival patient was on 12 L of oxygen, was treated albuterol,  Atrovent DuoNeb, Solu-Medrol IV 125 mg.  O2 demand has improved to 4 L. Chest x-ray revealed RML opacity concerning for pneumonia,  density in LUL secondary to chronic fibrotic changes Met SIRS criteria, sepsis due to hypoxia, WBC of 13.7, blood pressure 102/62, pulse 111, RR 26 SARS-CoV-2 negative . Cultures were obtained,  broad-spectrum antibiotics of Rocephin and doxycycline was initiated Patient was admitted to stepdown unit.  Patient continued to have significant hypoxia with exertion.  Patient has improved,  He has been moved to PCU.  Pulmonology is following.  Assessment & Plan:   Principal Problem:   Sepsis ([place]) Active Problems:   Acute hypoxemic respiratory failure (HCC)   Hematuria   Acute urinary retention  # Sepsis  Acute hypoxic respiratory failure,  suspect secondary to IPF exacerbation and CAP -Hemodynamically stable. -Patient status has much improved from sepsis -currently afebrile, normotensive, mildly tachypneic - was still requiring 4 L of oxygen satting 92% which goes up briefly to 5 L/m after exertion.  On admission patient met the sepsis criteria( leukocytosis 13.7, hypoxia, tachycardia, tachypnea) -Lactic acid 5.0, 4.6, 1.3, 1.1 now  -Initiated on sepsis protocol, fluid resuscitation, IV antibiotics, follow-up cultures  -O2 demand has improved from 12 L to 4 L this a.m., satting 92% -Remains tachypneic, with mild labored breathing -Pulmonary  critical care team following, IV steroids has been switched to p.o. taper -IV antibiotic Zosyn was switched to p.o. Augmentin.  -MRSA nares negative-withholding use of vancomycin at this time -Pulmonary critical consulted, following accordingly appreciate input. -Holding procalcitonin as he is on chronic steroids at home  Chest x-ray was reviewed.  Due to hypoxia, elevated D-dimer followed by CT angiogram Which revealed negative for pulmonary embolism, groundglass appearance, questionable infiltrate -SARS-CoV-2 negative. -O2 saturation significantly improved, oxygen requirement has trended down to 2 L/min.  # Tachycardia with slightly elevated troponin.- Resolved. Troponin34 likely from demand ischemia Irregular rhythm on telemetry concerning for possible A. fib without known history Continue telemetry Lopressor as needed for HR>110. All EKGs were personally reviewed, sinus tachycardia, with PVCs noted,  # Hematuria/urinary retention: Brief episode, Foley catheter was placed at the night of 12/30/2019 -we will monitor I's and O's, UA revealed many bacteria, cultures no growth so far Hematuria has resolved, H&H stable. Continue Foley catheter in anticipation of voiding trial on 01/02/2020. Urology consulted, recommended CT urogram, showed non obstructing renal calculi Urine culture : No growth so far, Flomax started.  # History of IPF Follows with Dr. Chase Caller. Onnintendanib as an outpatient, on hold pending PCCM recommendations  History of lung cancer -In remission -Post chemotherapy radiation therapy  Anxiety/depression -Continue Celexa, stable   DVT prophylaxis:  SCDs due to hematuria. Code Status:  Partial Family Communication: Discussed with wife at bed side. Disposition Plan:   Dispo: The patient is from: Home  Anticipated d/c is to: Home  Anticipated d/c date is: > 3 days  Patient currently is not medically stable to  d/c.  In respiratory failure, meeting sepsis  criteria Needing IV fluids, IV antibiotics, breathing treatments, oxygen supplementation.  Needing Subspecialty pulmonary consultation   Consultants:    PCCM, Urology.  Procedures:   Antimicrobials:  Anti-infectives (From admission, onward)   Start     Dose/Rate Route Frequency Ordered Stop   12/29/19 2200  amoxicillin-clavulanate (AUGMENTIN) 875-125 MG per tablet 1 tablet     Discontinue     1 tablet Oral Every 12 hours 12/29/19 1459 01/04/20 2159   12/28/19 2000  piperacillin-tazobactam (ZOSYN) IVPB 3.375 g  Status:  Discontinued        3.375 g 12.5 mL/hr over 240 Minutes Intravenous Every 8 hours 12/28/19 1238 12/29/19 1459   12/28/19 1400  doxycycline (VIBRA-TABS) tablet 100 mg     Discontinue     100 mg Oral Every 12 hours 12/28/19 1330     12/28/19 1345  cefTRIAXone (ROCEPHIN) 1 g in sodium chloride 0.9 % 100 mL IVPB  Status:  Discontinued        1 g 200 mL/hr over 30 Minutes Intravenous Every 24 hours 12/28/19 1330 12/28/19 1335   12/28/19 1245  piperacillin-tazobactam (ZOSYN) IVPB 3.375 g        3.375 g 100 mL/hr over 30 Minutes Intravenous  Once 12/28/19 1234 12/28/19 1456   12/28/19 1100  cefTRIAXone (ROCEPHIN) 1 g in sodium chloride 0.9 % 100 mL IVPB  Status:  Discontinued        1 g 200 mL/hr over 30 Minutes Intravenous  Once 12/28/19 1057 12/28/19 1226   12/28/19 1100  doxycycline (VIBRA-TABS) tablet 100 mg  Status:  Discontinued        100 mg Oral  Once 12/28/19 1057 12/28/19 1330     Subjective: Patient was seen and examined this morning, Awake, Alert, following commands.  No overnight events. Reports shortness of breath has improved , but still easily gets short of breath with minimal exertion.   Objective: Vitals:   01/02/20 0651 01/02/20 0655 01/02/20 0834 01/02/20 1325  BP:    118/71  Pulse:    95  Resp:  21  20  Temp:    97.7 F (36.5 C)  TempSrc:    Oral  SpO2: 95% 95% 95% 92%  Weight:      Height:         Intake/Output Summary (Last 24 hours) at 01/02/2020 1331 Last data filed at 01/02/2020 1100 Gross per 24 hour  Intake --  Output 1600 ml  Net -1600 ml   Filed Weights   12/28/19 1654  Weight: 74.4 kg    Examination:  General exam: Appears calm and comfortable  Respiratory system: Clear to auscultation. Respiratory effort normal. Cardiovascular system: S1 & S2 heard, RRR. No JVD, murmurs, rubs, gallops or clicks. No pedal edema. Gastrointestinal system: Abdomen is nondistended, soft and nontender. No organomegaly or masses felt. Normal bowel sounds heard. Central nervous system: Alert and oriented. No focal neurological deficits. Extremities: No edema, No swelling. Skin: No rashes, lesions or ulcers Psychiatry: Judgement and insight appear normal. Mood & affect appropriate.     Data Reviewed: I have personally reviewed following labs and imaging studies  CBC: Recent Labs  Lab 12/28/19 1031 12/28/19 1428 12/29/19 0239 12/29/19 0239 12/29/19 1408 12/30/19 0229 12/31/19 0220 01/01/20 0244 01/02/20 0531  WBC 13.7*   < > 13.7*  --   --  17.6* 12.6* 9.4 8.9  NEUTROABS 11.8*  --   --   --   --   --   --   --   --  HGB 14.5   < > 13.9   < > 14.3 13.2 13.1 13.8 14.0  HCT 44.4   < > 43.1   < > 44.1 42.3 41.9 43.4 44.0  MCV 91.0   < > 91.7  --   --  93.0 93.7 92.7 91.7  PLT 155   < > 145*  --   --  151 168 147* 150   < > = values in this interval not displayed.   Basic Metabolic Panel: Recent Labs  Lab 12/28/19 1031 12/28/19 1428 12/29/19 0239 12/30/19 0229 12/31/19 0220 01/01/20 0244 01/02/20 0531  NA   < >  --  139 139 140 139 137  K   < >  --  4.0 4.3 4.0 4.3 4.0  CL   < >  --  103 105 104 103 99  CO2   < >  --  _0 GLUCOSE   < >  --  148* 136* 117* 100* 100*  BUN   < >  --  22 30* 30* 25* 27*  CREATININE   < > 1.12 0.76 0.86 0.80 0.78 0.76  CALCIUM   < >  --  8.6* 8.6* 8.6* 8.6* 8.5*  MG  --  2.0  --   --   --  2.4  --   PHOS  --   --   --    --   --  3.0  --    < > = values in this interval not displayed.   GFR: Estimated Creatinine Clearance: 59.8 mL/min (by C-G formula based on SCr of 0.76 mg/dL). Liver Function Tests: Recent Labs  Lab 12/28/19 1031  AST 19  ALT 17  ALKPHOS 58  BILITOT 0.9  PROT 6.6  ALBUMIN 3.4*   No results for input(s): LIPASE, AMYLASE in the last 168 hours. No results for input(s): AMMONIA in the last 168 hours. Coagulation Profile: Recent Labs  Lab 12/29/19 1408  INR 1.1   Cardiac Enzymes: No results for input(s): CKTOTAL, CKMB, CKMBINDEX, TROPONINI in the last 168 hours. BNP (last 3 results) Recent Labs    09/19/19 1105  PROBNP 275   HbA1C: No results for input(s): HGBA1C in the last 72 hours. CBG: No results for input(s): GLUCAP in the last 168 hours. Lipid Profile: No results for input(s): CHOL, HDL, LDLCALC, TRIG, CHOLHDL, LDLDIRECT in the last 72 hours. Thyroid Function Tests: No results for input(s): TSH, T4TOTAL, FREET4, T3FREE, THYROIDAB in the last 72 hours. Anemia Panel: No results for input(s): VITAMINB12, FOLATE, FERRITIN, TIBC, IRON, RETICCTPCT in the last 72 hours. Sepsis Labs: Recent Labs  Lab 12/28/19 1428 12/28/19 1739 12/28/19 2340 12/29/19 0239  PROCALCITON 0.21  --   --   --   LATICACIDVEN 5.0* 4.6* 1.3 1.1    Recent Results (from the past 240 hour(s))  SARS Coronavirus 2 by RT PCR (hospital order, performed in [facility] hospital lab) Nasopharyngeal Nasopharyngeal Swab     Status: None   Collection Time: 12/28/19 10:31 AM   Specimen: Nasopharyngeal Swab  Result Value Ref Range Status   SARS Coronavirus 2 NEGATIVE NEGATIVE Final    Comment: (NOTE) SARS-CoV-2 target nucleic acids are NOT DETECTED.  The SARS-CoV-2 RNA is generally detectable in upper and lower respiratory specimens during the acute phase of infection. The lowest concentration of SARS-CoV-2 viral copies this assay can detect is 250 copies / mL. A negative result does not preclude  SARS-CoV-2 infection and should not  be used as the sole basis for treatment or other patient management decisions.  A negative result may occur with improper specimen collection / handling, submission of specimen other than nasopharyngeal swab, presence of viral mutation(s) within the areas targeted by this assay, and inadequate number of viral copies (<250 copies / mL). A negative result must be combined with clinical observations, patient history, and epidemiological information.  Fact Sheet for Patients:   StrictlyIdeas.no  Fact Sheet for Healthcare Providers: BankingDealers.co.za  This test is not yet approved or  cleared by the [place] FDA and has been authorized for detection and/or diagnosis of SARS-CoV-2 by FDA under an Emergency Use Authorization (EUA).  This EUA will remain in effect (meaning this test can be used) for the duration of the COVID-19 declaration under Section 564(b)(1) of the Act, 21 U.S.C. section 360bbb-3(b)(1), unless the authorization is terminated or revoked sooner.  Performed at [facility], [place] [address] [place], [place] [address]   MRSA PCR Screening     Status: None   Collection Time: 12/28/19 12:24 PM   Specimen: Nasal Mucosa; Nasopharyngeal  Result Value Ref Range Status   MRSA by PCR NEGATIVE NEGATIVE Final    Comment:        The GeneXpert MRSA Assay (FDA approved for NASAL specimens only), is one component of a comprehensive MRSA colonization surveillance program. It is not intended to diagnose MRSA infection nor to guide or monitor treatment for MRSA infections. Performed at [facility], [place] [address], [place], [place] [address]   Culture, Urine     Status: None   Collection Time: 12/29/19  6:48 PM   Specimen: Urine, Random  Result Value Ref Range Status   Specimen Description   Final    URINE, RANDOM Performed at [facility] [address], [place], [place] [address]    Special Requests   Final    NONE Performed at [facility], [place] [address], [place], [place] [address]    Culture   Final    NO GROWTH Performed at [facility], [place] [address], [place], [place] [address]    Report Status 12/30/2019 FINAL  Final     Radiology Studies: CT HEMATURIA WORKUP  Result Date: 12/31/2019 CLINICAL DATA:  Gross hematuria, urinary retention, history of IPF EXAM: CT ABDOMEN AND PELVIS WITHOUT AND WITH CONTRAST TECHNIQUE: Multidetector CT imaging of the abdomen and pelvis was performed following the standard protocol before and following the bolus administration of intravenous contrast. CONTRAST:  110m OMNIPAQUE IOHEXOL 300 MG/ML  SOLN COMPARISON:  CT chest angiogram, 12/28/2019, PET-CT, 07/11/2018 FINDINGS: Lower chest: No acute abnormality. Small, chronic, loculated left pleural effusion. Fibrotic change and honeycombing at the bilateral lung bases. Hepatobiliary: No solid liver abnormality is seen. No gallstones, gallbladder wall thickening, or biliary dilatation. Pancreas: Unremarkable. No pancreatic ductal dilatation or surrounding inflammatory changes. Spleen: Normal in size without significant abnormality. Adrenals/Urinary Tract: Adrenal glands are unremarkable. Small nonobstructive calculi of the midportion of the right kidney (series 8, image 46). The urinary bladder is thickened although decompressed by Foley catheter, with small diverticula of the dome. Stomach/Bowel: Stomach is within normal limits. Appendix appears normal. No evidence of bowel wall thickening, distention, or inflammatory changes. Sigmoid diverticula. Fluid throughout the colon to the rectum. Vascular/Lymphatic: Aortic atherosclerosis. 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 No enlarged abdominal or pelvic lymph nodes. Reproductive: No mass or other significant abnormality. Other:  No  abdominal wall hernia or abnormality. No abdominopelvic ascites. Musculoskeletal: No acute or significant osseous findings. IMPRESSION: 1. Small nonobstructive calculi of the midportion of the right kidney. No other evidence of urinary tract calculus or hydronephrosis. No evidence of mass or filling defect of the urinary tract on delayed phase imaging. 2. The urinary bladder is thickened although decompressed by Foley catheter, with small diverticula of the dome. Findings are most consistent with sequelae of chronic outlet obstruction. 3. Fibrosis of the included bilateral lung bases, in keeping with prior CT examinations of the chest and history of IPF. 4. 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 Aortic Atherosclerosis (ICD10-I70.0). Electronically Signed   By: Eddie Candle M.D.   On: 12/31/2019 16:12     Scheduled Meds: . amoxicillin-clavulanate  1 tablet Oral Q12H  . citalopram  10 mg Oral Daily  . doxycycline  100 mg Oral Q12H  . enoxaparin (LOVENOX) injection  40 mg Subcutaneous Q24H  . mouth rinse  15 mL Mouth Rinse BID  . melatonin  5 mg Oral QHS  . Nintedanib  150 mg Oral BID WC  . pantoprazole  40 mg Oral Daily  . polyethylene glycol  17 g Oral Daily  . predniSONE  30 mg Oral Q breakfast  . rosuvastatin  5 mg Oral Daily  . sodium chloride flush  3 mL Intravenous Q12H  . umeclidinium bromide  1 puff Inhalation Daily   Continuous Infusions:    LOS: 5 days    Time spent: 25 mins.    Shawna Clamp, MD [facility]   If 7PM-7AM, please contact night-coverage

## 2020-01-02 NOTE — Progress Notes (Signed)
NAMETahj Summers, MRN:  [id number], DOB:  Nov 26, 1931, LOS: 5 ADMISSION DATE:  12/28/2019, CONSULTATION DATE:  12/28/2019 REFERRING MD:  Jason Summers, CHIEF COMPLAINT:  Shortness of breath   Brief History   86 year old gentleman with a history of pulmonary fibrosis Follows up regularly with Jason Summers Shortness of breath worsening over the last couple of days, could not even take a shower on the morning of presentation Has been coughing with initially clear phlegm in the last few days a little greenish Denies a fever, no chills Is short of breath with most activity Reformed smoker Was exposed to a lot of glue material relating to woodworking in is working life He is on OVEF and compliant Inhalers have not helped him in the past Last evaluation did not qualify for oxygen supplementation Cardiac catheterization did not show significant pulmonary hypertension  Past Medical History   Past Medical History:  Diagnosis Date  . Acute rhinitis 08/10/2017  . Acute stress disorder 10/27/2007   Qualifier: Diagnosis of  By: Arnoldo Morale MD, Balinda Quails   Overview:  Overview:  Qualifier: Diagnosis of  By: Arnoldo Morale MD, Balinda Quails  . Anxiety 07/14/2018  . Arthritis   . Bilateral epiphora 09/25/2015  . Blush 09/23/2007   Qualifier: Diagnosis of  By: Arnoldo Morale MD, Balinda Quails   Overview:  Overview:  Qualifier: Diagnosis of  By: Arnoldo Morale MD, [facility])    basal cell on right temple  . Chest pain in adult 12/16/2016  . Chronic right-sided thoracic back pain 12/16/2016  . Compression fracture of body of thoracic vertebra ([place]) 02/09/2019  . Dysphagia 07/17/2010   Qualifier: Diagnosis of  By: Trellis Paganini PA-c, Amy S   . Eustachian tube dysfunction, bilateral 08/10/2017  . Ganglion of joint 04/22/2007   Qualifier: Diagnosis of  By: Arnoldo Morale MD, Balinda Quails   . Gastroesophageal reflux disease 05/15/2016  . Headache 01/30/2016  . Hemorrhoids   . HEMORRHOIDS, INTERNAL 02/18/2007   Qualifier: Diagnosis of  By: Arnoldo Morale MD, Balinda Quails   .  IPF (idiopathic pulmonary fibrosis) ([place]) 07/14/2018   06/22/2018-CT chest with contrast- spectrum of findings suggestive of basilar predominant fibrotic interstitial lung disease with mild honeycombing asymmetrically involving the right lung with significant progression since February/2019 in September/2019 chest CT.  Findings consistent with UIP.  06/22/2018-echocardiogram- LV ejection fraction 55 to 53%, grade 1 diastolic dysfunction, PA P pressure 4  . Lateral epicondylitis (tennis elbow)   . Lingular mass 09/07/2015  . Mass of lingula of lung 09/07/2015  . Mass of lower lobe of left lung 09/07/2015  . Mediastinal adenopathy 09/07/2015  . Mediastinal lymphadenopathy 08/27/2015  . Multiple pulmonary nodules 08/27/2015  . Nausea and vomiting 05/15/2016  . Non-small cell cancer of left lung ([place]) 09/24/2015   09/13/2015: Left lower lobe lung biopsy: Overall, the histologic features, in conjunction with the positive staining for cytokeratin 903, cytokeratin 5/6, and p63 support a diagnosis of poorly differentiated squamous cell carcinoma. There is likely sufficient tumor remaining for additional studies, if requested. (JBK:ds 09/17/15)     . Non-small cell lung cancer (NSCLC) ([place]) dx'f 07/30/15  . Nuclear sclerosis of right eye 09/25/2015  . Odynophagia 11/06/2015  . Osteoarthritis 07/17/2010   Qualifier: Diagnosis of  By: Trellis Paganini PA-c, Amy S   . Persistent headaches 02/03/2017  . Pleural effusion on left 08/14/2016  . Pseudophakia, left eye 09/25/2015  . Pulmonary emphysema ([place]) 08/14/2016  . Radiation fibrosis of lung ([place]) 08/14/2016  . Shortness of breath  08/14/2016  . Soft tissue lesion of shoulder region 04/22/2007   Qualifier: Diagnosis of  By: Arnoldo Morale MD, Balinda Quails   Overview:  Overview:  Qualifier: Diagnosis of  By: Arnoldo Morale MD, Balinda Quails  . Spondylosis of cervical joint   . SPRAIN AND STRAIN OF SACROILIAC 09/06/2008   Qualifier: Diagnosis of  By: Arnoldo Morale MD, [facility] Events   7/21  Admit 7/24 Urology consulted for gross hematuria  Consults:  PCCM  Procedures:  None  Significant Diagnostic Tests:  CT chest 7/21-no PE, underlying emphysema, New groundglass opacities in the right upper lobe and lower lobe.  I have reviewed the images personally.   Micro Data:  SARS coronavirus negative Sputum cultures requested  Antimicrobials:  Zosyn 7/21 Doxycycline7/21>> Augmentin 7/22>>  Interim history/subjective:  [place] easily with activity.  Down to 83% on 6L [place] with PT yesterday, but comfortable at rest on 2L. Denies SOB, cough.    Objective   Blood pressure (!) 153/85, pulse 64, temperature 97.9 F (36.6 C), temperature source Oral, resp. rate 21, height _0  (1.676 m), weight 74.4 kg, SpO2 95 %.        Intake/Output Summary (Last 24 hours) at 01/02/2020 1157 Last data filed at 01/02/2020 1100 Gross per 24 hour  Intake --  Output 1600 ml  Net -1600 ml   Filed Weights   12/28/19 1654  Weight: 74.4 kg    Examination: Gen:      No acute distress HEENT:  EOMI, sclera anicteric Neck:     No masses; no thyromegaly Lungs:   resps even non labored on 2L [place], crackles throughout  CV:         Regular rate and rhythm; no murmurs Abd:      + bowel sounds; soft, non-tender; no palpable masses, no distension Ext:    No edema; adequate peripheral perfusion Skin:      Warm and dry; no rash Neuro: alert and oriented x 3 Psych: normal mood and affect  Resolved Hospital Problem list     Assessment & Plan:  Acute on chronic hypoxic respiratory failure Secondary to CAP, COPD. Underlying IPF, on Ofev. Continue antibiotics steroids, supplemental oxygen Bronchodilators Suspect will need home O2 on d/c  outpt pulm f/u  Continue PT efforts   Sepsis, present on admission, unknown organism Hemodynamically stable.  Lactic acid has normalized.  Anxiety and depression Continue Celexa  History of lung cancer Stable for the last 3 years Treated with chemotherapy  and radiation treatment   Jason Madrid, NP Pulmonary/Critical Care Medicine  01/02/2020  11:57 AM

## 2020-01-03 DIAGNOSIS — A419 Sepsis, unspecified organism: Secondary | ICD-10-CM | POA: Diagnosis not present

## 2020-01-03 LAB — CBC
HCT: 44.1 % (ref 39.0–52.0)
Hemoglobin: 14.2 g/dL (ref 13.0–17.0)
MCH: 29 pg (ref 26.0–34.0)
MCHC: 32.2 g/dL (ref 30.0–36.0)
MCV: 90 fL (ref 80.0–100.0)
Platelets: 158 10*3/uL (ref 150–400)
RBC: 4.9 MIL/uL (ref 4.22–5.81)
RDW: 13.3 % (ref 11.5–15.5)
WBC: 9.3 10*3/uL (ref 4.0–10.5)
nRBC: 0 % (ref 0.0–0.2)

## 2020-01-03 LAB — BASIC METABOLIC PANEL
Anion gap: 9 (ref 5–15)
BUN: 25 mg/dL — ABNORMAL HIGH (ref 8–23)
CO2: 25 mmol/L (ref 22–32)
Calcium: 8.6 mg/dL — ABNORMAL LOW (ref 8.9–10.3)
Chloride: 101 mmol/L (ref 98–111)
Creatinine, Ser: 0.69 mg/dL (ref 0.61–1.24)
GFR calc Af Amer: >60 mL/min (ref >60)
GFR calc non Af Amer: >60 mL/min (ref >60)
Glucose, Bld: 106 mg/dL — ABNORMAL HIGH (ref 70–99)
Potassium: 3.7 mmol/L (ref 3.5–5.1)
Sodium: 135 mmol/L (ref 135–145)

## 2020-01-03 NOTE — Care Management Important Message (Signed)
Important Message  Patient Details IM Letter presented to the Patient Name: Jason Summers MRN: [id number] Date of Birth: 1931/12/11   Medicare Important Message Given:  Yes     Kerin Salen 01/03/2020, 3:25 PM

## 2020-01-03 NOTE — Progress Notes (Signed)
Chart reviewed.  Pt much improved on my exam yesterday. Plan to continue PT efforts, wean O2 although suspect he may need home O2 on d/c (had previously been told he needed home O2 but according to wife the machine made too much noise).   PCCM will be available PRN  Outpt appt scheduled with Dr Chase Caller 8/17 _0    Nickolas Madrid, NP Pulmonary/Critical Care Medicine  01/03/2020  11:27 AM

## 2020-01-03 NOTE — Progress Notes (Signed)
PROGRESS NOTE    Jason Summers  BOF:[id number] DOB: 26-Sep-1931 DOA: 12/28/2019 PCP: [name], NP   Brief Narrative:  This Patient is 86 year old male with past medical history of NSCLC, IPF onNintendaniband chronic steroidsand follows with Dr. Chase Caller, GERD who presented to [facility] ED on 7/21 with progressively worsening shortness of breath for several weeks with acute decompensation this a.m. and hypoxia at home prompting his wife to call EMS.   Upon arrival patient was on 12 L of oxygen, was treated albuterol,  Atrovent DuoNeb, Solu-Medrol IV 125 mg.  O2 demand has improved to 4 L. Chest x-ray revealed RML opacity concerning for pneumonia,  density in LUL secondary to chronic fibrotic changes Met SIRS criteria, sepsis due to hypoxia, WBC of 13.7, blood pressure 102/62, pulse 111, RR 26 SARS-CoV-2 negative . Cultures were obtained,  broad-spectrum antibiotics of Rocephin and doxycycline was initiated Patient was admitted to stepdown unit.  Patient continued to have significant hypoxia with exertion.  Patient has improved,  He has been moved to PCU.  Pulmonology is following.  Assessment & Plan:   Principal Problem:   Sepsis ([place]) Active Problems:   Acute hypoxemic respiratory failure (HCC)   Hematuria   Acute urinary retention  # Sepsis  Acute hypoxic respiratory failure,  suspect secondary to IPF exacerbation and CAP -Hemodynamically stable. -Patient status has much improved from sepsis -currently afebrile, normotensive, mildly tachypneic - was still requiring 4 L of oxygen satting 92% which goes up briefly to 5 L/m after exertion.  On admission patient met the sepsis criteria( leukocytosis 13.7, hypoxia, tachycardia, tachypnea) -Lactic acid 5.0, 4.6, 1.3, 1.1 now  -Initiated on sepsis protocol, fluid resuscitation, IV antibiotics, follow-up cultures  -O2 demand has improved from 12 L to 4 L this a.m., satting 92% -Remains tachypneic, with mild labored breathing -Pulmonary  critical care team following, IV steroids has been switched to p.o. taper -IV antibiotic Zosyn was switched to p.o. Augmentin.  -MRSA nares negative-withholding use of vancomycin at this time -Pulmonary critical consulted, following accordingly appreciate input. -Holding procalcitonin as he is on chronic steroids at home  Chest x-ray was reviewed.  Due to hypoxia, elevated D-dimer followed by CT angiogram Which revealed negative for pulmonary embolism, groundglass appearance, questionable infiltrate -SARS-CoV-2 negative. -O2 saturation significantly improved, oxygen requirement has trended down to 2 L/min.  # Tachycardia with slightly elevated troponin.- Resolved. Troponin34 likely from demand ischemia Irregular rhythm on telemetry concerning for possible A. fib without known history Continue telemetry Lopressor as needed for HR>110. All EKGs were personally reviewed, sinus tachycardia, with PVCs noted,  # Hematuria/urinary retention: ->>> Resolved. Brief episode, Foley catheter was placed at the night of 12/30/2019 -we will monitor I's and O's, UA revealed many bacteria, cultures no growth so far Hematuria has resolved, H&H stable. Continue Foley catheter in anticipation of voiding trial on 01/02/2020. Urology consulted, recommended CT urogram, showed non obstructing renal calculi Urine culture : No growth so far, Flomax started.  # History of IPF Follows with Dr. Chase Caller. Onnintendanib as an outpatient, on hold pending PCCM recommendations  History of lung cancer -In remission -Post chemotherapy radiation therapy  Anxiety/depression -Continue Celexa, stable   DVT prophylaxis:  SCDs due to hematuria. Code Status:  Partial Family Communication: Discussed with wife at bed side. Disposition Plan:   Dispo: The patient is from: Home  Anticipated d/c is to: Home  Anticipated d/c date is: 7/28  Patient currently is not medically  stable to d/c.  In respiratory failure,  breathing  treatments, oxygen supplementation. Very deconditioned.  Consultants:    PCCM, Urology.  Procedures:   Antimicrobials:  Anti-infectives (From admission, onward)   Start     Dose/Rate Route Frequency Ordered Stop   12/29/19 2200  amoxicillin-clavulanate (AUGMENTIN) 875-125 MG per tablet 1 tablet     Discontinue     1 tablet Oral Every 12 hours 12/29/19 1459 01/04/20 2159   12/28/19 2000  piperacillin-tazobactam (ZOSYN) IVPB 3.375 g  Status:  Discontinued        3.375 g 12.5 mL/hr over 240 Minutes Intravenous Every 8 hours 12/28/19 1238 12/29/19 1459   12/28/19 1400  doxycycline (VIBRA-TABS) tablet 100 mg     Discontinue     100 mg Oral Every 12 hours 12/28/19 1330     12/28/19 1345  cefTRIAXone (ROCEPHIN) 1 g in sodium chloride 0.9 % 100 mL IVPB  Status:  Discontinued        1 g 200 mL/hr over 30 Minutes Intravenous Every 24 hours 12/28/19 1330 12/28/19 1335   12/28/19 1245  piperacillin-tazobactam (ZOSYN) IVPB 3.375 g        3.375 g 100 mL/hr over 30 Minutes Intravenous  Once 12/28/19 1234 12/28/19 1456   12/28/19 1100  cefTRIAXone (ROCEPHIN) 1 g in sodium chloride 0.9 % 100 mL IVPB  Status:  Discontinued        1 g 200 mL/hr over 30 Minutes Intravenous  Once 12/28/19 1057 12/28/19 1226   12/28/19 1100  doxycycline (VIBRA-TABS) tablet 100 mg  Status:  Discontinued        100 mg Oral  Once 12/28/19 1057 12/28/19 1330     Subjective: Patient was seen and examined this morning, Awake, Alert, following commands.  No overnight events. Reports shortness of breath has improved , but still easily gets short of breath with minimal exertion. Hematuria resolved, reports generalized weakness.   Objective: Vitals:   01/02/20 1325 01/02/20 2018 01/03/20 0517 01/03/20 0738  BP: 118/71 128/84 114/80   Pulse: 95 87 78   Resp: 20 (!) 24 20   Temp: 97.7 F (36.5 C) 97.7 F (36.5 C) 98.4 F (36.9 C)   TempSrc: Oral Oral Oral   SpO2:  92% 92% 94% 96%  Weight:      Height:        Intake/Output Summary (Last 24 hours) at 01/03/2020 1308 Last data filed at 01/03/2020 0815 Gross per 24 hour  Intake 480 ml  Output 575 ml  Net -95 ml   Filed Weights   12/28/19 1654  Weight: 74.4 kg    Examination:  General exam: Appears calm and comfortable  Respiratory system: Clear to auscultation. Respiratory effort normal. Cardiovascular system: S1 & S2 heard, RRR. No JVD, murmurs, rubs, gallops or clicks. No pedal edema. Gastrointestinal system: Abdomen is nondistended, soft and nontender. No organomegaly or masses felt. Normal bowel sounds heard. Central nervous system: Alert and oriented. No focal neurological deficits. Extremities: No edema, No swelling. Skin: No rashes, lesions or ulcers Psychiatry: Judgement and insight appear normal. Mood & affect appropriate.     Data Reviewed: I have personally reviewed following labs and imaging studies  CBC: Recent Labs  Lab 12/28/19 1031 12/28/19 1428 12/30/19 0229 12/31/19 0220 01/01/20 0244 01/02/20 0531 01/03/20 0516  WBC 13.7*   < > 17.6* 12.6* 9.4 8.9 9.3  NEUTROABS 11.8*  --   --   --   --   --   --   HGB 14.5   < > 13.2 13.1 13.8  14.0 14.2  HCT 44.4   < > 42.3 41.9 43.4 44.0 44.1  MCV 91.0   < > 93.0 93.7 92.7 91.7 90.0  PLT 155   < > 151 168 147* 150 158   < > = values in this interval not displayed.   Basic Metabolic Panel: Recent Labs  Lab 12/28/19 1428 12/29/19 0239 12/30/19 0229 12/31/19 0220 01/01/20 0244 01/02/20 0531 01/03/20 0516  NA  --    < > 139 140 139 137 135  K  --    < > 4.3 4.0 4.3 4.0 3.7  CL  --    < > 105 104 103 99 101  CO2  --    < > _0 GLUCOSE  --    < > 136* 117* 100* 100* 106*  BUN  --    < > 30* 30* 25* 27* 25*  CREATININE 1.12   < > 0.86 0.80 0.78 0.76 0.69  CALCIUM  --    < > 8.6* 8.6* 8.6* 8.5* 8.6*  MG 2.0  --   --   --  2.4  --   --   PHOS  --   --   --   --  3.0  --   --    < > = values in this  interval not displayed.   GFR: Estimated Creatinine Clearance: 59.8 mL/min (by C-G formula based on SCr of 0.69 mg/dL). Liver Function Tests: Recent Labs  Lab 12/28/19 1031  AST 19  ALT 17  ALKPHOS 58  BILITOT 0.9  PROT 6.6  ALBUMIN 3.4*   No results for input(s): LIPASE, AMYLASE in the last 168 hours. No results for input(s): AMMONIA in the last 168 hours. Coagulation Profile: Recent Labs  Lab 12/29/19 1408  INR 1.1   Cardiac Enzymes: No results for input(s): CKTOTAL, CKMB, CKMBINDEX, TROPONINI in the last 168 hours. BNP (last 3 results) Recent Labs    09/19/19 1105  PROBNP 275   HbA1C: No results for input(s): HGBA1C in the last 72 hours. CBG: No results for input(s): GLUCAP in the last 168 hours. Lipid Profile: No results for input(s): CHOL, HDL, LDLCALC, TRIG, CHOLHDL, LDLDIRECT in the last 72 hours. Thyroid Function Tests: No results for input(s): TSH, T4TOTAL, FREET4, T3FREE, THYROIDAB in the last 72 hours. Anemia Panel: No results for input(s): VITAMINB12, FOLATE, FERRITIN, TIBC, IRON, RETICCTPCT in the last 72 hours. Sepsis Labs: Recent Labs  Lab 12/28/19 1428 12/28/19 1739 12/28/19 2340 12/29/19 0239  PROCALCITON 0.21  --   --   --   LATICACIDVEN 5.0* 4.6* 1.3 1.1    Recent Results (from the past 240 hour(s))  SARS Coronavirus 2 by RT PCR (hospital order, performed in [facility] hospital lab) Nasopharyngeal Nasopharyngeal Swab     Status: None   Collection Time: 12/28/19 10:31 AM   Specimen: Nasopharyngeal Swab  Result Value Ref Range Status   SARS Coronavirus 2 NEGATIVE NEGATIVE Final    Comment: (NOTE) SARS-CoV-2 target nucleic acids are NOT DETECTED.  The SARS-CoV-2 RNA is generally detectable in upper and lower respiratory specimens during the acute phase of infection. The lowest concentration of SARS-CoV-2 viral copies this assay can detect is 250 copies / mL. A negative result does not preclude SARS-CoV-2 infection and should not be  used as the sole basis for treatment or other patient management decisions.  A negative result may occur with improper specimen collection / handling, submission of specimen other than nasopharyngeal  swab, presence of viral mutation(s) within the areas targeted by this assay, and inadequate number of viral copies (<250 copies / mL). A negative result must be combined with clinical observations, patient history, and epidemiological information.  Fact Sheet for Patients:   StrictlyIdeas.no  Fact Sheet for Healthcare Providers: BankingDealers.co.za  This test is not yet approved or  cleared by the [place] FDA and has been authorized for detection and/or diagnosis of SARS-CoV-2 by FDA under an Emergency Use Authorization (EUA).  This EUA will remain in effect (meaning this test can be used) for the duration of the COVID-19 declaration under Section 564(b)(1) of the Act, 21 U.S.C. section 360bbb-3(b)(1), unless the authorization is terminated or revoked sooner.  Performed at [facility], [place] [address], [place], [place] [address]   MRSA PCR Screening     Status: None   Collection Time: 12/28/19 12:24 PM   Specimen: Nasal Mucosa; Nasopharyngeal  Result Value Ref Range Status   MRSA by PCR NEGATIVE NEGATIVE Final    Comment:        The GeneXpert MRSA Assay (FDA approved for NASAL specimens only), is one component of a comprehensive MRSA colonization surveillance program. It is not intended to diagnose MRSA infection nor to guide or monitor treatment for MRSA infections. Performed at [facility], [place] [address] [place], [place] [address]   Culture, Urine     Status: None   Collection Time: 12/29/19  6:48 PM   Specimen: Urine, Random  Result Value Ref Range Status   Specimen Description   Final    URINE, RANDOM Performed at [place] [address]  [place], [place] [address]    Special Requests   Final    NONE Performed at [facility], [place] [address], [place], [place] [address]    Culture   Final    NO GROWTH Performed at [facility], [place] [address] [place], [place] [address]    Report Status 12/30/2019 FINAL  Final     Radiology Studies: No results found.   Scheduled Meds: . amoxicillin-clavulanate  1 tablet Oral Q12H  . citalopram  10 mg Oral Daily  . doxycycline  100 mg Oral Q12H  . enoxaparin (LOVENOX) injection  40 mg Subcutaneous Q24H  . mouth rinse  15 mL Mouth Rinse BID  . melatonin  5 mg Oral QHS  . Nintedanib  150 mg Oral BID WC  . pantoprazole  40 mg Oral Daily  . polyethylene glycol  17 g Oral Daily  . predniSONE  30 mg Oral Q breakfast  . rosuvastatin  5 mg Oral Daily  . sodium chloride flush  3 mL Intravenous Q12H  . umeclidinium bromide  1 puff Inhalation Daily   Continuous Infusions:    LOS: 6 days    Time spent: 25 mins.    Shawna Clamp, MD [facility]   If 7PM-7AM, please contact night-coverage

## 2020-01-03 NOTE — Progress Notes (Signed)
Physical Therapy Treatment Patient Details Name: Jason Summers MRN: [id number] DOB: 01/27/1932 Today's Date: 01/03/2020    SATURATION QUALIFICATIONS: (This note is used to comply with regulatory documentation for home oxygen)  Patient Saturations on Room Air at Rest = 88%  Patient Saturations on [employer] with Minimal activity (getting to EOB) = 86%  Patient Saturations on 2 Liters of oxygen while Ambulating = 83%     History of Present Illness Pt admitted with acute hypoxic respiratory failuure 2* CAP and exacerbation of Idiopathic pulmonary fibrosis.  Pt with hx of Lung CA    PT Comments    Progressing with mobility.    Follow Up Recommendations  Home health PT     Equipment Recommendations  Rolling walker with 5" wheels    Recommendations for Other Services       Precautions / Restrictions Precautions Precautions: Fall Precaution Comments: monitor SaO2 Restrictions Weight Bearing Restrictions: No    Mobility  Bed Mobility   Bed Mobility: Supine to Sit     Supine to sit: Supervision        Transfers Overall transfer level: Needs assistance Equipment used: Rolling walker (2 wheeled) Transfers: Sit to/from Stand Sit to Stand: Min guard         General transfer comment: VCs safety, hand placement  Ambulation/Gait Ambulation/Gait assistance: Min guard Gait Distance (Feet): 150 Feet Assistive device: Rolling walker (2 wheeled) Gait Pattern/deviations: Step-through pattern;Decreased stride length     General Gait Details: O2 83% on 2L. Dyspnea 3/4.   Stairs             Wheelchair Mobility    Modified Rankin (Stroke Patients Only)       Balance           Standing balance support: Bilateral upper extremity supported Standing balance-Leahy Scale: Fair                              Cognition Arousal/Alertness: Awake/alert Behavior During Therapy: WFL for tasks assessed/performed Overall Cognitive Status: Within  Functional Limits for tasks assessed                                        Exercises      General Comments        Pertinent Vitals/Pain Pain Assessment: No/denies pain    Home Living                      Prior Function            PT Goals (current goals can now be found in the care plan section) Progress towards PT goals: Progressing toward goals    Frequency    Min 3X/week      PT Plan Current plan remains appropriate    Co-evaluation              AM-PAC PT "6 Clicks" Mobility   Outcome Measure  Help needed turning from your back to your side while in a flat bed without using bedrails?: None Help needed moving from lying on your back to sitting on the side of a flat bed without using bedrails?: None Help needed moving to and from a bed to a chair (including a wheelchair)?: A Little Help needed standing up from a chair using your arms (e.g., wheelchair or bedside chair)?: A Little  Help needed to walk in hospital room?: A Little Help needed climbing 3-5 steps with a railing? : A Little 6 Click Score: 20    End of Session Equipment Utilized During Treatment: Oxygen;Gait belt Activity Tolerance:  (activity limited by O2 desat) Patient left: in bed;with call bell/phone within reach;with family/visitor present;with nursing/sitter in room   PT Visit Diagnosis: Difficulty in walking, not elsewhere classified (R26.2)     Time: 5597-4163 PT Time Calculation (min) (ACUTE ONLY): 19 min  Charges:  $Gait Training: 8-22 mins                         Doreatha Massed, PT Acute Rehabilitation  Office: [phone] Pager: [phone]

## 2020-01-04 DIAGNOSIS — A419 Sepsis, unspecified organism: Secondary | ICD-10-CM | POA: Diagnosis not present

## 2020-01-04 LAB — CREATININE, SERUM
Creatinine, Ser: 0.67 mg/dL (ref 0.61–1.24)
GFR calc Af Amer: >60 mL/min (ref >60)
GFR calc non Af Amer: >60 mL/min (ref >60)

## 2020-01-04 LAB — CBC
HCT: 42 % (ref 39.0–52.0)
Hemoglobin: 13.7 g/dL (ref 13.0–17.0)
MCH: 29.4 pg (ref 26.0–34.0)
MCHC: 32.6 g/dL (ref 30.0–36.0)
MCV: 90.1 fL (ref 80.0–100.0)
Platelets: 168 10*3/uL (ref 150–400)
RBC: 4.66 MIL/uL (ref 4.22–5.81)
RDW: 13.5 % (ref 11.5–15.5)
WBC: 9.9 10*3/uL (ref 4.0–10.5)
nRBC: 0 % (ref 0.0–0.2)

## 2020-01-04 MED ORDER — DOXYCYCLINE HYCLATE 100 MG PO TABS: 100.0000 mg | ORAL_TABLET | Freq: Two times a day (BID) | ORAL | 0 refills | Status: AC

## 2020-01-04 NOTE — TOC Transition Note (Addendum)
Transition of Care [facility]) - CM/SW Discharge Note   Patient Details  Name: Marvon [place] MRN: [id number] Date of Birth: April 18, 1932  [facility] CM/SW Contact:  Ross Ludwig, LCSW Phone Number: 01/04/2020, 12:19 PM   Clinical Narrative:     CSW spoke to patient's wife and discussed patient needing oxygen, rolling walker, and also needing home health services.  CSW discussed different agencies, and patient's wife did not have a preference.  CSW contacted [facility], they are not able to accept patient, but [facility] agreed to accept patient.   Final next level of care: [place] Barriers to Discharge: Barriers Resolved   Patient Goals and CMS Choice Patient states their goals for this hospitalization and ongoing recovery are:: To return back home with home health services. CMS Medicare.gov Compare Post Acute Care list provided to:: Patient Represenative (must comment) Choice offered to / list presented to : Spouse  Discharge Placement                       Discharge Plan and Services                DME Arranged: Oxygen DME Agency: [employer] Date DME Agency Contacted: 01/04/20 Time DME Agency Contacted: 1200 Representative spoke with at DME Agency: Thedore Mins HH Arranged: PT, OT HH Agency: [facility] [place] Date [place]: 01/04/20 Time [place]: 1219 Representative spoke with at [place]: Amy  Social Determinants of Health ([place]) Interventions     Readmission Risk Interventions No flowsheet data found.

## 2020-01-04 NOTE — Discharge Summary (Signed)
Physician Discharge Summary  Colonel Mahon MVE:[id number] DOB: Feb 23, 1932 DOA: 12/28/2019  PCP: [name], NP  Admit date: 12/28/2019 Discharge date: 01/04/2020  Admitted From:  Home Disposition:  Home with home services.  Recommendations for Outpatient Follow-up:  1. Follow up with PCP in 1-2 weeks 2. Please obtain BMP/CBC in one week 3. Advised to take doxycycline 100 mg twice a day for 2 more days to complete total 10-day course for CAP. 4. Advised to follow-up with Dr. Chase Caller on August 17 as scheduled. 5. Advised to continue physical therapy regularly to get some strength and endurance.  Home Health: Yes Equipment/Devices Yes, Home oxygen.  Discharge Condition: Stable CODE STATUS:Limited code Diet recommendation: Heart Healthy   [place] Summary / Hospital course: This Patient is 86 year old male with past medical history of NSCLC, IPF onNintendaniband chronic steroidsand follows with Dr. Chase Caller, GERD who presented to [facility] ED on 7/21 with progressively worsening shortness of breath for several weeks with acute decompensation this a.m. and hypoxia at home prompting his wife to call EMS. Upon arrival patient was on 12 L of oxygen, was treated with albuterol,  Atrovent DuoNeb, Solu-Medrol IV 125 mg. O2 demand has improved to 4 L. Chest x-ray revealed RML opacity concerning for pneumonia,  density in LUL secondary to chronic fibrotic changes . He met SIRS criteria, later sepsis due to hypoxia, WBC of 13.7, blood pressure 102/62, pulse 111, RR 26 SARS-CoV-2 negative . Cultures were obtained,  broad-spectrum antibiotics of Rocephin and doxycycline was initiated. Patient was admitted in the ICU,   Patient continued to have significant hypoxia with exertion.  Patient was managed by PCCM.  Patient continued to improve slowly,  his oxygen requirement has reduced to 2 L/min.  Patient has completed 8 days of IV antibiotics, patient was cleared from pulmonology to be discharged.  Patient is  being discharged on doxycycline for 2 more days to complete total 10-day treatment.  Appointment has been made with Dr. Chase Caller on August 17.  Nintendanib was kept on hold until appointment with Dr. Chase Caller.  Patient had a brief episode of hematuria urology was consulted,  CT urogram was completed and found to have a nonobstructing kidney stone that needs to be followed as an outpatient.  hematuria has resolved, Patient is being discharged home with home services,  PT and oxygen has been arranged.  Discharge Diagnoses:  Principal Problem:   Sepsis ([place]) Active Problems:   HYPERCHOLESTEROLEMIA, MILD   Multiple pulmonary nodules   Mediastinal lymphadenopathy   Non-small cell cancer of left lung (HCC)   Gastroesophageal reflux disease   Pulmonary emphysema (HCC)   IPF (idiopathic pulmonary fibrosis) (HCC)   Pulmonary hypertension, unspecified ([place])  He was managed for below problems.  # Sepsis  Acute hypoxic respiratory failure,  suspect secondary to IPF exacerbation and CAP -Hemodynamically stable. -Patient status has much improved from sepsis-currently afebrile, normotensive, mildly tachypneic - was still requiring 4 L of oxygen satting 92% which goes up briefly to 5 L/m after exertion.  On admission patient met the sepsis criteria( leukocytosis 13.7, hypoxia, tachycardia, tachypnea) -Lactic acid 5.0, 4.6, 1.3, 1.1now -Initiated on sepsis protocol, fluid resuscitation, IV antibiotics, follow-up cultures  -O2 demand has improved from 12 L to 4 L this a.m., satting92% -Remains tachypneic, with mild labored breathing -Pulmonary critical care team following, IV steroids has been switched to p.o. taper -IV antibiotic Zosyn was switched to p.o. Augmentin.  -MRSA nares negative-withholding use of vancomycin at this time -Pulmonary critical consulted, following accordingly appreciate input. -  Holding procalcitonin as he is on chronic steroids at home  Chest x-ray was reviewed.  Due to hypoxia, elevated D-dimer followed by CT angiogram Which revealed negative for pulmonary embolism, groundglass appearance, questionable infiltrate -SARS-CoV-2 negative. -O2 saturation significantly improved, oxygen requirement has trended down to 2 L/min.  # Tachycardia with slightly elevated troponin.- Resolved. Troponin34 likely from demand ischemia Irregular rhythm on telemetry concerning for possible A. fib without known history Continue telemetry Lopressor as needed for HR>110. All EKGs were personally reviewed, sinus tachycardia, with PVCs noted,  # Hematuria/urinary retention: ->>> Resolved. Brief episode, Foley catheter was placed at the night of 12/30/2019-we will monitor I's and O's, UA revealed many bacteria, cultures no growth so far Hematuria has resolved, H&H stable. Continue Foley catheter in anticipation of voiding trial on 01/02/2020. Urology consulted, recommended CT urogram, showed non obstructing renal calculi Urine culture : No growth so far, Flomax started.  # History of IPF Follows with Dr. Chase Caller. Onnintendanib as an outpatient, on hold pending PCCM recommendations  History of lung cancer -In remission -Post chemotherapy radiation therapy  Anxiety/depression -Continue Celexa, stable   Discharge Instructions  Discharge Instructions    Call MD for:  difficulty breathing, headache or visual disturbances   Complete by: As directed    Call MD for:  persistant dizziness or light-headedness   Complete by: As directed    Call MD for:  persistant nausea and vomiting   Complete by: As directed    Call MD for:  temperature >100.4   Complete by: As directed    Diet - low sodium heart healthy   Complete by: As directed    Discharge instructions   Complete by: As directed    Advised to follow-up with primary care physician in 1 week. Advised to take doxycycline 100 mg twice a day for 2 more days to complete total 10-day course for  CAP. Advised to follow-up with Dr. Chase Caller on August 17 as scheduled. Advised to continue physical therapy regularly to get some strength and endurance.   Increase activity slowly   Complete by: As directed      Allergies as of 01/04/2020      Reactions   Gentamicin Other (See Comments)   Made eyes red, swollen, hot feeling   Loteprednol-tobramycin Other (See Comments)   Other reaction(s): Other (See Comments) Made eyes Red, Swollen, Red, Warm feeling  Other reaction(s): Other (See Comments) Made eyes Red, Swollen, Red, Warm feeling  Made eyes Red, Swollen, Red, Warm feeling       Medication List    STOP taking these medications   Ofev 150 MG Caps Generic drug: Nintedanib     TAKE these medications   calcium carbonate 500 MG chewable tablet Commonly known as: TUMS - dosed in mg elemental calcium Chew 1 tablet by mouth 2 (two) times daily as needed for heartburn.   citalopram 10 MG tablet Commonly known as: CeleXA Take 1 tablet (10 mg total) by mouth daily.   doxycycline 100 MG tablet Commonly known as: VIBRA-TABS Take 1 tablet (100 mg total) by mouth every 12 (twelve) hours for 2 days.   esomeprazole 40 MG capsule Commonly known as: NexIUM Take 1 capsule (40 mg total) by mouth 2 (two) times daily before a meal.   glucosamine-chondroitin 500-400 MG tablet Take 1 tablet by mouth daily.   Incruse Ellipta 62.5 MCG/INH Aepb Generic drug: umeclidinium bromide Inhale 1 puff into the lungs daily.   predniSONE 10 MG tablet Commonly known as: DELTASONE  TAKE 1 1/2 TABLET BY MOUTH DAILY WITH BREAKFAST What changed:   how much to take  how to take this  when to take this  additional instructions   ProAir HFA 108 (90 Base) MCG/ACT inhaler Generic drug: albuterol INHALE 2 PUFFS BY MOUTH INTO THE LUNGS EVERY 6 HOURS AS NEEDED FOR WHEEZING OR SHORTNESS OF BREATH What changed: See the new instructions.   rosuvastatin 5 MG tablet Commonly known as: CRESTOR TAKE 1  TABLET(5 MG) BY MOUTH DAILY What changed: See the new instructions.            Durable Medical Equipment  (From admission, onward)         Start     Ordered   01/04/20 1125  DME Oxygen  Once       Question Answer Comment  Length of Need Lifetime   Mode or (Route) Nasal cannula   Liters per Minute 2   Frequency Continuous (stationary and portable oxygen unit needed)   Oxygen conserving device Yes   Oxygen delivery system Gas      01/04/20 1126          Follow-up Information    Brand Males, MD Follow up on 01/24/2020.   Specialty: Pulmonary Disease Why: 930am  Contact information: [place] [address] [phone]        [name], NP Follow up in 1 week(s).   Specialty: Family Medicine Contact information: [address] [place] [address] [phone]        Josue Hector, MD .   Specialty: Cardiology Contact information: [phone] [place] [address] [phone]              Allergies  Allergen Reactions  . Gentamicin Other (See Comments)    Made eyes red, swollen, hot feeling  . Loteprednol-Tobramycin Other (See Comments)    Other reaction(s): Other (See Comments) Made eyes Red, Swollen, Red, Warm feeling  Other reaction(s): Other (See Comments) Made eyes Red, Swollen, Red, Warm feeling  Made eyes Red, Swollen, Red, Warm feeling     Consultations:  Pulmonology   Procedures/Studies: CT ANGIO CHEST PE W OR WO CONTRAST  Result Date: 12/28/2019 CLINICAL DATA:  Hypoxic respiratory failure, positive D dimer, history of pulmonary fibrosis, history of left lung cancer EXAM: CT ANGIOGRAPHY CHEST WITH CONTRAST TECHNIQUE: Multidetector CT imaging of the chest was performed using the standard protocol during bolus administration of intravenous contrast. Multiplanar CT image reconstructions and MIPs were obtained to evaluate the vascular anatomy. CONTRAST:  174m OMNIPAQUE  IOHEXOL 350 MG/ML SOLN COMPARISON:  09/20/2019 FINDINGS: Cardiovascular: This is a technically adequate evaluation of the pulmonary vasculature. There are no filling defects or pulmonary emboli. The heart is unremarkable without pericardial effusion. Unremarkable thoracic aorta with no evidence of aneurysm or dissection. Minimal atherosclerosis. Mediastinum/Nodes: Stable 13 mm precarinal lymph node. No new pathologic adenopathy. Segmental air esophagram incidentally noted, of doubtful clinical significance. Trachea and thyroid are grossly normal. Lungs/Pleura: Emphysema and subpleural scarring and fibrosis again noted. Prominent bronchiectasis and volume loss within the left upper lobe. Small left pleural effusion is again noted. The gas in the left pleural space on prior study has resolved in the interim. There is superimposed ground-glass airspace disease within the right middle and right lower lobe, new since prior study. This could reflect asymmetric edema, infection, or nonspecific inflammation. No pneumothorax. Central airways are patent. Upper Abdomen: No acute abnormality.  Stable hepatic cysts. Musculoskeletal:  There are no acute displaced fractures. Chronic midthoracic wedge compression deformity without significant change. Reconstructed images demonstrate no additional findings. Review of the MIP images confirms the above findings. IMPRESSION: 1. No evidence of pulmonary embolus. 2. New ground-glass airspace disease within the right middle and right lower lobes, which could reflect asymmetric edema, infection, or nonspecific inflammation. 3. Emphysema and diffuse subpleural scarring and fibrosis. 4. Aortic Atherosclerosis (ICD10-I70.0) and Emphysema (ICD10-J43.9). Electronically Signed   By: Randa Ngo M.D.   On: 12/28/2019 19:11   DG Chest Port 1 View  Result Date: 12/28/2019 CLINICAL DATA:  Worsening shortness of breath. History of pulmonary fibrosis. EXAM: PORTABLE CHEST 1 VIEW COMPARISON:   09/19/2019 FINDINGS: The heart size appears normal. Chronic loculated left pleural effusion overlying the posterolateral left lower lobe is identified. Extensive, bilateral changes of pulmonary fibrosis identified. Similar appearance of focal, chronic airspace density within the left upper lobe. New hazy opacity is identified within the periphery of the right midlung which may reflect superimposed pneumonia. IMPRESSION: 1. New hazy opacity within the periphery of the right midlung which may reflect superimposed pneumonia. 2. Stable chronic airspace density within the left upper lobe secondary to chronic fibrotic changes. 3. Chronic loculated left pleural effusion. Electronically Signed   By: Kerby Moors M.D.   On: 12/28/2019 10:46   CT HEMATURIA WORKUP  Result Date: 12/31/2019 CLINICAL DATA:  Gross hematuria, urinary retention, history of IPF EXAM: CT ABDOMEN AND PELVIS WITHOUT AND WITH CONTRAST TECHNIQUE: Multidetector CT imaging of the abdomen and pelvis was performed following the standard protocol before and following the bolus administration of intravenous contrast. CONTRAST:  18m OMNIPAQUE IOHEXOL 300 MG/ML  SOLN COMPARISON:  CT chest angiogram, 12/28/2019, PET-CT, 07/11/2018 FINDINGS: Lower chest: No acute abnormality. Small, chronic, loculated left pleural effusion. Fibrotic change and honeycombing at the bilateral lung bases. Hepatobiliary: No solid liver abnormality is seen. No gallstones, gallbladder wall thickening, or biliary dilatation. Pancreas: Unremarkable. No pancreatic ductal dilatation or surrounding inflammatory changes. Spleen: Normal in size without significant abnormality. Adrenals/Urinary Tract: Adrenal glands are unremarkable. Small nonobstructive calculi of the midportion of the right kidney (series 8, image 46). The urinary bladder is thickened although decompressed by Foley catheter, with small diverticula of the dome. Stomach/Bowel: Stomach is within normal limits. Appendix  appears normal. No evidence of bowel wall thickening, distention, or inflammatory changes. Sigmoid diverticula. Fluid throughout the colon to the rectum. Vascular/Lymphatic: Aortic atherosclerosis. 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 No enlarged abdominal or pelvic lymph nodes. Reproductive: No mass or other significant abnormality. Other: No abdominal wall hernia or abnormality. No abdominopelvic ascites. Musculoskeletal: No acute or significant osseous findings. IMPRESSION: 1. Small nonobstructive calculi of the midportion of the right kidney. No other evidence of urinary tract calculus or hydronephrosis. No evidence of mass or filling defect of the urinary tract on delayed phase imaging. 2. The urinary bladder is thickened although decompressed by Foley catheter, with small diverticula of the dome. Findings are most consistent with sequelae of chronic outlet obstruction. 3. Fibrosis of the included bilateral lung bases, in keeping with prior CT examinations of the chest and history of IPF. 4. 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 Aortic Atherosclerosis (ICD10-I70.0). Electronically Signed   By: AEddie CandleM.D.   On: 12/31/2019 16:12   DG ESOPHAGUS W DOUBLE CM (HD)  Result Date: 12/14/2019 CLINICAL DATA:  Pharyngoesophageal dysphagia EXAM: ESOPHOGRAM / BARIUM SWALLOW / BARIUM TABLET STUDY TECHNIQUE: Combined  double contrast and single contrast examination performed using effervescent crystals, thick barium liquid, and thin barium liquid. The patient was observed with fluoroscopy swallowing a 13 mm barium sulphate tablet. FLUOROSCOPY TIME:  Fluoroscopy Time:  3 minutes and 24 seconds Radiation Exposure Index (if provided by the fluoroscopic device): 491 mGy Number of Acquired Spot Images: 4 COMPARISON:  Prior study from 2017 FINDINGS: Initial barium swallows demonstrate normal pharyngeal motion with swallowing. No laryngeal  penetration or aspiration. Prominent cricopharyngeal impression is noted. There is also smooth strictured narrowing near the cervicothoracic esophageal junction just above a Zenker's type diverticulum posteriorly. This is slightly larger when compared to the prior study from 2017. A few other tiny diverticuli are noted. Significant esophageal motility disorder with disruption of the primary peristaltic wave and frequent tertiary contractions and moderate esophageal stasis. No worrisome esophageal lesions are identified. There is a small hiatal hernia and a widely patent lower esophageal mucosal ring (Schatzki's ring). The 13 mm barium pill passed into the stomach without difficulty. Episodes of spontaneous and inducible GE reflux were noted. IMPRESSION: 1. 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 2. A few other tiny diverticuli are noted and there is mild smooth narrowing near the cervicothoracic junction. 3. Significant esophageal motility disorder. 4. Small hiatal hernia and stable widely patent lower esophageal mucosal ring. Electronically Signed   By: Marijo Sanes M.D.   On: 12/14/2019 10:28      Subjective: Patient was seen and examined at bedside.  He reports feeling much better,  his oxygen saturation has improved significantly.  O2 saturation 96% on 2 L supplemental oxygen at rest.  Patient is excited to go home.  No overnight events.  Discharge Exam: Vitals:   01/04/20 0525 01/04/20 0828  BP: 116/79   Pulse: 75   Resp: 22   Temp: (!) 97.5 F (36.4 C)   SpO2: 94% 94%   Vitals:   01/03/20 1945 01/03/20 2126 01/04/20 0525 01/04/20 0828  BP: 117/79  116/79   Pulse: 84  75   Resp: 22  22   Temp: 97.6 F (36.4 C)  (!) 97.5 F (36.4 C)   TempSrc: Oral  Oral   SpO2: 91% 96% 94% 94%  Weight:      Height:        General: Pt is alert, awake, not in acute distress Cardiovascular: RRR, S1/S2 +, no rubs, no gallops Respiratory: CTA  bilaterally, no wheezing, no rhonchi Abdominal: Soft, NT, ND, bowel sounds + Extremities: no edema, no cyanosis    The results of significant diagnostics from this hospitalization (including imaging, microbiology, ancillary and laboratory) are listed below for reference.     Microbiology: Recent Results (from the past 240 hour(s))  SARS Coronavirus 2 by RT PCR (hospital order, performed in [facility] hospital lab) Nasopharyngeal Nasopharyngeal Swab     Status: None   Collection Time: 12/28/19 10:31 AM   Specimen: Nasopharyngeal Swab  Result Value Ref Range Status   SARS Coronavirus 2 NEGATIVE NEGATIVE Final    Comment: (NOTE) SARS-CoV-2 target nucleic acids are NOT DETECTED.  The SARS-CoV-2 RNA is generally detectable in upper and lower respiratory specimens during the acute phase of infection. The lowest concentration of SARS-CoV-2 viral copies this assay can detect is 250 copies / mL. A negative result does not preclude SARS-CoV-2 infection and should not be used as the sole basis for treatment or other patient management decisions.  A negative result may occur with improper specimen collection /  handling, submission of specimen other than nasopharyngeal swab, presence of viral mutation(s) within the areas targeted by this assay, and inadequate number of viral copies (<250 copies / mL). A negative result must be combined with clinical observations, patient history, and epidemiological information.  Fact Sheet for Patients:   StrictlyIdeas.no  Fact Sheet for Healthcare Providers: BankingDealers.co.za  This test is not yet approved or  cleared by the [place] FDA and has been authorized for detection and/or diagnosis of SARS-CoV-2 by FDA under an Emergency Use Authorization (EUA).  This EUA will remain in effect (meaning this test can be used) for the duration of the COVID-19 declaration under Section 564(b)(1) of the Act,  21 U.S.C. section 360bbb-3(b)(1), unless the authorization is terminated or revoked sooner.  Performed at [facility], [place] [address] [place], [place] [address]   MRSA PCR Screening     Status: None   Collection Time: 12/28/19 12:24 PM   Specimen: Nasal Mucosa; Nasopharyngeal  Result Value Ref Range Status   MRSA by PCR NEGATIVE NEGATIVE Final    Comment:        The GeneXpert MRSA Assay (FDA approved for NASAL specimens only), is one component of a comprehensive MRSA colonization surveillance program. It is not intended to diagnose MRSA infection nor to guide or monitor treatment for MRSA infections. Performed at [facility], [place] [address] [place], [place] [address]   Culture, Urine     Status: None   Collection Time: 12/29/19  6:48 PM   Specimen: Urine, Random  Result Value Ref Range Status   Specimen Description   Final    URINE, RANDOM Performed at [facility] [address], [place], [place] [address]    Special Requests   Final    NONE Performed at [facility], [place] [address], [place], [place] [address]    Culture   Final    NO GROWTH Performed at [facility], [place] [address], [place], [place] [address]    Report Status 12/30/2019 FINAL  Final     Labs: BNP (last 3 results) Recent Labs    12/28/19 1031  BNP 712.5*   Basic Metabolic Panel: Recent Labs  Lab 12/28/19 1428 12/29/19 0239 12/30/19 0229 12/30/19 0229 12/31/19 0220 01/01/20 0244 01/02/20 0531 01/03/20 0516 01/04/20 0409  NA  --    < > 139  --  140 139 137 135  --   K  --    < > 4.3  --  4.0 4.3 4.0 3.7  --   CL  --    < > 105  --  104 103 99 101  --   CO2  --    < > 24  --  _0 --   GLUCOSE  --    < > 136*  --  117* 100* 100* 106*  --   BUN  --    < > 30*  --  30* 25* 27* 25*  --   CREATININE 1.12   < > 0.86   < > 0.80 0.78 0.76 0.69 0.67  CALCIUM  --    < > 8.6*  --  8.6* 8.6* 8.5* 8.6*   --   MG 2.0  --   --   --   --  2.4  --   --   --   PHOS  --   --   --   --   --  3.0  --   --   --    < > = values in this interval not displayed.   Liver Function Tests: No results for input(s): AST, ALT, ALKPHOS, BILITOT, PROT, ALBUMIN in the last 168 hours. No results for input(s): LIPASE, AMYLASE in the last 168 hours. No results for input(s): AMMONIA in the last 168 hours. CBC: Recent Labs  Lab 12/31/19 0220 01/01/20 0244 01/02/20 0531 01/03/20 0516 01/04/20 0409  WBC 12.6* 9.4 8.9 9.3 9.9  HGB 13.1 13.8 14.0 14.2 13.7  HCT 41.9 43.4 44.0 44.1 42.0  MCV 93.7 92.7 91.7 90.0 90.1  PLT 168 147* 150 158 168   Cardiac Enzymes: No results for input(s): CKTOTAL, CKMB, CKMBINDEX, TROPONINI in the last 168 hours. BNP: Invalid input(s): POCBNP CBG: No results for input(s): GLUCAP in the last 168 hours. D-Dimer No results for input(s): DDIMER in the last 72 hours. Hgb A1c No results for input(s): HGBA1C in the last 72 hours. Lipid Profile No results for input(s): CHOL, HDL, LDLCALC, TRIG, CHOLHDL, LDLDIRECT in the last 72 hours. Thyroid function studies No results for input(s): TSH, T4TOTAL, T3FREE, THYROIDAB in the last 72 hours.  Invalid input(s): FREET3 Anemia work up No results for input(s): VITAMINB12, FOLATE, FERRITIN, TIBC, IRON, RETICCTPCT in the last 72 hours. Urinalysis    Component Value Date/Time   COLORURINE RED (A) 12/29/2019 1848   APPEARANCEUR TURBID (A) 12/29/2019 1848   LABSPEC  12/29/2019 1848    TEST NOT REPORTED DUE TO COLOR INTERFERENCE OF URINE PIGMENT   PHURINE  12/29/2019 1848    TEST NOT REPORTED DUE TO COLOR INTERFERENCE OF URINE PIGMENT   GLUCOSEU (A) 12/29/2019 1848    TEST NOT REPORTED DUE TO COLOR INTERFERENCE OF URINE PIGMENT   HGBUR (A) 12/29/2019 1848    TEST NOT REPORTED DUE TO COLOR INTERFERENCE OF URINE PIGMENT   BILIRUBINUR (A) 12/29/2019 1848    TEST NOT REPORTED DUE TO COLOR INTERFERENCE OF URINE PIGMENT   BILIRUBINUR -  05/18/2019 0946   KETONESUR (A) 12/29/2019 1848    TEST NOT REPORTED DUE TO COLOR INTERFERENCE OF URINE PIGMENT   PROTEINUR (A) 12/29/2019 1848    TEST NOT REPORTED DUE TO COLOR INTERFERENCE OF URINE PIGMENT   UROBILINOGEN 0.2 05/18/2019 0946   NITRITE (A) 12/29/2019 1848    TEST NOT REPORTED DUE TO COLOR INTERFERENCE OF URINE PIGMENT   LEUKOCYTESUR (A) 12/29/2019 1848    TEST NOT REPORTED DUE TO COLOR INTERFERENCE OF URINE PIGMENT   Sepsis Labs Invalid input(s): PROCALCITONIN,  WBC,  LACTICIDVEN Microbiology Recent Results (from the past 240 hour(s))  SARS Coronavirus 2 by RT PCR (hospital order, performed in [facility] hospital lab) Nasopharyngeal Nasopharyngeal Swab     Status: None   Collection Time: 12/28/19 10:31 AM   Specimen: Nasopharyngeal Swab  Result Value Ref Range Status   SARS Coronavirus 2 NEGATIVE NEGATIVE Final    Comment: (NOTE) SARS-CoV-2 target nucleic acids are NOT DETECTED.  The SARS-CoV-2 RNA is generally detectable in upper and lower respiratory specimens during the acute phase of infection. The lowest concentration of SARS-CoV-2 viral copies this assay can detect is 250 copies / mL. A negative result does not preclude SARS-CoV-2 infection and should not be used as the sole basis for treatment or other patient management decisions.  A negative result may occur with improper specimen collection / handling, submission of specimen other than nasopharyngeal swab, presence of viral mutation(s) within the areas targeted by this assay, and inadequate number  of viral copies (<250 copies / mL). A negative result must be combined with clinical observations, patient history, and epidemiological information.  Fact Sheet for Patients:   StrictlyIdeas.no  Fact Sheet for Healthcare Providers: BankingDealers.co.za  This test is not yet approved or  cleared by the [place] FDA and has been authorized for detection  and/or diagnosis of SARS-CoV-2 by FDA under an Emergency Use Authorization (EUA).  This EUA will remain in effect (meaning this test can be used) for the duration of the COVID-19 declaration under Section 564(b)(1) of the Act, 21 U.S.C. section 360bbb-3(b)(1), unless the authorization is terminated or revoked sooner.  Performed at [facility], [place] [address], [place], [place] [address]   MRSA PCR Screening     Status: None   Collection Time: 12/28/19 12:24 PM   Specimen: Nasal Mucosa; Nasopharyngeal  Result Value Ref Range Status   MRSA by PCR NEGATIVE NEGATIVE Final    Comment:        The GeneXpert MRSA Assay (FDA approved for NASAL specimens only), is one component of a comprehensive MRSA colonization surveillance program. It is not intended to diagnose MRSA infection nor to guide or monitor treatment for MRSA infections. Performed at [facility], [place] [address], [place], [place] [address]   Culture, Urine     Status: None   Collection Time: 12/29/19  6:48 PM   Specimen: Urine, Random  Result Value Ref Range Status   Specimen Description   Final    URINE, RANDOM Performed at [place] [address] [place], [place] [address]    Special Requests   Final    NONE Performed at [facility], [place] [address], [place], [place] [address]    Culture   Final    NO GROWTH Performed at [facility], [place] [address], [place],  [address]    Report Status 12/30/2019 FINAL  Final     Time coordinating discharge: Over 30 minutes  SIGNED:   Shawna Clamp, MD  [facility] 01/04/2020, 1:06 PM Pager   If 7PM-7AM, please contact night-coverage [email or web address]

## 2020-01-04 NOTE — Discharge Instructions (Signed)
Advised to take doxycycline 100 mg twice a day for 2 more days to complete total 10-day course for CAP. Advised to follow-up with Dr. Chase Caller on August 17 as scheduled. Advised to continue physical therapy regularly to get some strength and endurance.

## 2020-01-04 NOTE — Progress Notes (Signed)
Physical Therapy Treatment Patient Details Name: Jason Summers MRN: [id number] DOB: 06-25-1931 Today's Date: 01/04/2020    History of Present Illness Pt admitted with acute hypoxic respiratory failuure 2* CAP and exacerbation of Idiopathic pulmonary fibrosis.  Pt with hx of Lung CA    PT Comments    Pt continues to required O2 with rest and activity.  Required frequent cues for pursed lip breathing and rest breaks.  Demonstrating mild unsteadiness with gait - recommend RW.  Cont to progress as able.    Pt was on 2 LPM at rest with sats 98%.  Attempted RA at rest but sats down to 86%, so placed back on 2 LPM and let oxygen return to 95% prior to ambulation. Ambulation on 2 LPM with sats down to 85%; increased to 3 LPM O2 and sats 90% with ambulation but then dropped to 84% during toielting ADLS.  REquired 4 LPM O2 to return to 95% then gradually decreased to 2 LPM with sats 94%.    Follow Up Recommendations  Home health PT     Equipment Recommendations  Rolling walker with 5" wheels    Recommendations for Other Services       Precautions / Restrictions Precautions Precautions: Fall Precaution Comments: monitor SaO2    Mobility  Bed Mobility Overal bed mobility: Needs Assistance Bed Mobility: Supine to Sit     Supine to sit: Supervision        Transfers Overall transfer level: Needs assistance Equipment used: None Transfers: Sit to/from Stand Sit to Stand: Min guard         General transfer comment: VCs safety, hand placement; sit to stand x 2; wife assisted with toielting ADLs  Ambulation/Gait Ambulation/Gait assistance: Min assist Gait Distance (Feet): 100 Feet (x2) Assistive device: None Gait Pattern/deviations: Step-through pattern;Decreased stride length Gait velocity: decreased   General Gait Details: Mild unsteadiness, with fatigue had 2 LOB requiring min A for balance; took 1 standing rest break; DOE of 3/4 with frequent cues for pursed lip breathing  (see below for VS)   Stairs Stairs: Yes Stairs assistance: Min assist Stair Management: One rail Left;Step to pattern Number of Stairs: 2     Wheelchair Mobility    Modified Rankin (Stroke Patients Only)       Balance Overall balance assessment: Needs assistance Sitting-balance support: No upper extremity supported;Feet supported Sitting balance-Leahy Scale: Good     Standing balance support: No upper extremity supported Standing balance-Leahy Scale: Fair                              Cognition Arousal/Alertness: Awake/alert Behavior During Therapy: WFL for tasks assessed/performed Overall Cognitive Status: Within Functional Limits for tasks assessed                                        Exercises      General Comments General comments (skin integrity, edema, etc.):   Pt was on 2 LPM at rest with sats 98%.  Attempted RA at rest but sats down to 86%, so placed back on 2 LPM and let oxygen return to 95% prior to ambulation. Ambulation on 2 LPM with sats down to 85%; increased to 3 LPM O2 and sats 90% with ambulation but then dropped to 84% during toielting ADLS.  REquired 4 LPM O2 to return to 95% then gradually decreased  to 2 LPM with sats 94%.  HR 100 bpm rest and up to 125 bpm with walking.     Pt and wife educated on current O2 needs and recommendation to continue using RW>      Pertinent Vitals/Pain Pain Assessment: No/denies pain    Home Living                      Prior Function            PT Goals (current goals can now be found in the care plan section) Acute Rehab PT Goals Patient Stated Goal: Regain IND PT Goal Formulation: With patient Time For Goal Achievement: 01/15/20 Potential to Achieve Goals: Good Progress towards PT goals: Progressing toward goals    Frequency    Min 3X/week      PT Plan Current plan remains appropriate    Co-evaluation              AM-PAC PT "6 Clicks" Mobility    Outcome Measure  Help needed turning from your back to your side while in a flat bed without using bedrails?: None Help needed moving from lying on your back to sitting on the side of a flat bed without using bedrails?: None Help needed moving to and from a bed to a chair (including a wheelchair)?: None Help needed standing up from a chair using your arms (e.g., wheelchair or bedside chair)?: None Help needed to walk in hospital room?: A Little Help needed climbing 3-5 steps with a railing? : A Little 6 Click Score: 22    End of Session Equipment Utilized During Treatment: Oxygen;Gait belt Activity Tolerance: Other (comment) (Limited by DOE O2 sats decreasing) Patient left: with call bell/phone within reach;with family/visitor present;in chair;with chair alarm set Nurse Communication: Mobility status (O2 needs) PT Visit Diagnosis: Difficulty in walking, not elsewhere classified (R26.2)     Time: 2575-0518 PT Time Calculation (min) (ACUTE ONLY): 34 min  Charges:  $Gait Training: 8-22 mins $Therapeutic Activity: 8-22 mins                     Abran Richard, PT Acute Rehab Services Pager [phone] [facility] [phone]     Karlton Lemon 01/04/2020, 11:04 AM

## 2020-01-05 ENCOUNTER — Telehealth: Payer: Self-pay | Admitting: Adult Health

## 2020-01-05 NOTE — Telephone Encounter (Signed)
Transition Care Management Follow-up Telephone Call  Date of discharge and from where: 01/04/2020 from [facility]  How have you been since you were released from the hospital? Patient's wife states that he has been very weak and gets exhausted easily since leaving the hospital.   Any questions or concerns? No   Items Reviewed:  Did the pt receive and understand the discharge instructions provided? Yes   Medications obtained and verified? Yes   Any new allergies since your discharge? No   Dietary orders reviewed? Yes  Do you have support at home? Yes has his wife in the home  Functional Questionnaire: (I = Independent and D = Dependent) ADLs: D  Bathing/Dressing- I with some assistance at times  Meal Prep- D   Eating- I  Maintaining continence- I  Transferring/Ambulation- I with walker  Managing Meds- D  Follow up appointments reviewed:   PCP Hospital f/u appt confirmed? Yes  Scheduled to see [name], NP on 01/10/2020 @ 10:00 am.  [facility] f/u appt confirmed? Yes  Scheduled to see Dr. Chase Caller  on 01/24/2020 @ 9:30 am.  Are transportation arrangements needed? No   If their condition worsens, is the pt aware to call PCP or go to the Emergency Dept.? Yes  Was the patient provided with contact information for the PCP's office or ED? Yes  Was to pt encouraged to call back with questions or concerns? Yes

## 2020-01-06 ENCOUNTER — Telehealth: Payer: Self-pay | Admitting: Adult Health

## 2020-01-06 NOTE — Telephone Encounter (Signed)
Will [place]  [phone]  Needs OT verbal orders   2 x a week for 2 weeks 1 x a week for 2 weeks

## 2020-01-09 ENCOUNTER — Telehealth: Payer: Self-pay | Admitting: Internal Medicine

## 2020-01-09 ENCOUNTER — Telehealth: Payer: Self-pay | Admitting: Cardiovascular Disease

## 2020-01-09 ENCOUNTER — Telehealth: Payer: Self-pay | Admitting: Adult Health

## 2020-01-09 DIAGNOSIS — J84112 Idiopathic pulmonary fibrosis: Secondary | ICD-10-CM

## 2020-01-09 MED ORDER — PREDNISONE 10 MG PO TABS
30.0000 mg | ORAL_TABLET | Freq: Every day | ORAL | 0 refills | Status: DC
Start: 2020-01-09 — End: 2020-01-12

## 2020-01-09 NOTE — Telephone Encounter (Signed)
Spoke with patient's wife, Mickel Baas, listed on the PDR and provided Dr. Matilde Bash recommendations below:  Reviewed hospital records.  Patient was treated for community-acquired pneumonia with antibiotics  Asking to increase the prednisone from 15 mg to 30 mg a day until he can be seen by Dr. Chase Caller Increase oxygen prescription to 2 L at rest and 4 L with exertion  Patient's wife verified understanding.  She did state that she only has enough Prednisone to increase his prednisone from 15 mg to 30 mg until seen by Dr. Chase Caller.  Dr. Vaughan Browner, please advise if it is ok to send more prednisone to patient's pharmacy.  Thank you.

## 2020-01-09 NOTE — Telephone Encounter (Signed)
Yes. Please send prescription for more prednisone

## 2020-01-09 NOTE — Telephone Encounter (Signed)
Jason Summers from [facility] call to let Dr. Elease Hashimoto know that the patients Heart Rate at rest is ranging from 104-107. They are with holding PT till his Heart Rate is back to normal.

## 2020-01-09 NOTE — Addendum Note (Signed)
Addended by: Vanessa Barbara on: 01/09/2020 01:59 PM   Modules accepted: Orders

## 2020-01-09 NOTE — Telephone Encounter (Signed)
Spoke with patient's wife, Mickel Baas, listed on DPR.  Wife states patient was recently discharged from the hospital on 01/04/2020 with acute respiratory failure with hypoxia and pna.  She states he is having sob with exertion from the bed to the bathroom and from his chair to the bathroom.  He was sent home on 2L of oxygen, sitting his sats are 90-92%, with activity, his sats drop to 67% and his HR is anywhere from 110-140.  Patient has a f/u with his pcp on 01/10/2020 and with Dr. Chase Caller on 01/24/20.  Dr. Vaughan Browner, Please advise.

## 2020-01-09 NOTE — Telephone Encounter (Signed)
STAT if HR is under 50 or over 120 (normal HR is 60-100 beats per minute)  1) What is your heart rate? 127  2) Do you have a log of your heart rate readings (document readings)? n/a  3) Do you have any other symptoms? Face kind of pale, difficulty breathing

## 2020-01-09 NOTE — Telephone Encounter (Signed)
Call was sent straight to triage. Patient's wife (DPR) is concerned about her husband's oxygen dropping, difficulty breathing, and elevated HR when he gets up to the bathroom or does anything. Patient's wife has called Dr. Chase Caller today too. His office started patient on prednisone and increased O2 to 4L with exertion. Patient's wife is concerned about patient's HR being so elevated. Informed her that once patient's oxygen is under control, that his heart rate should come down. Informed her that if patient is SOB and does not resolve with rest to call 911. Patient has appointment with PCP tomorrow, encourage patient's wife to keep this appointment so patient can get evaluated.

## 2020-01-09 NOTE — Telephone Encounter (Signed)
Reviewed hospital records.  Patient was treated for community-acquired pneumonia with antibiotics  Asking to increase the prednisone from 15 mg to 30 mg a day until he can be seen by Dr. Chase Caller Increase oxygen prescription to 2 L at rest and 4 L with exertion.

## 2020-01-09 NOTE — Telephone Encounter (Signed)
New order placed for [employer], for 2L at rest and 4L with exertion despite 1st order stating the same, nothing further needed.

## 2020-01-09 NOTE — Telephone Encounter (Signed)
Prednisone sent to pharmacy per Dr. Vaughan Browner and order sent to [employer] for exertional oxygen to be at 4L.  Nothing further needed.

## 2020-01-09 NOTE — Telephone Encounter (Signed)
See alternate encounter.

## 2020-01-10 ENCOUNTER — Encounter: Payer: Self-pay | Admitting: Adult Health

## 2020-01-10 ENCOUNTER — Telehealth (INDEPENDENT_AMBULATORY_CARE_PROVIDER_SITE_OTHER): Payer: Medicare Other | Admitting: Adult Health

## 2020-01-10 ENCOUNTER — Telehealth: Payer: Self-pay | Admitting: Internal Medicine

## 2020-01-10 ENCOUNTER — Other Ambulatory Visit: Payer: Self-pay

## 2020-01-10 VITALS — BP 116/76 | HR 114 | Wt 160.0 lb

## 2020-01-10 DIAGNOSIS — J84112 Idiopathic pulmonary fibrosis: Secondary | ICD-10-CM

## 2020-01-10 DIAGNOSIS — F419 Anxiety disorder, unspecified: Secondary | ICD-10-CM | POA: Diagnosis not present

## 2020-01-10 DIAGNOSIS — A419 Sepsis, unspecified organism: Secondary | ICD-10-CM

## 2020-01-10 DIAGNOSIS — R5383 Other fatigue: Secondary | ICD-10-CM | POA: Diagnosis not present

## 2020-01-10 NOTE — Telephone Encounter (Signed)
Spoke to [place] with [employer] they have delivered o2 for pt that fits his needs today

## 2020-01-10 NOTE — Progress Notes (Signed)
Virtual Visit via Telephone Note  I connected with Jason Summers on 01/10/20 at 10:00 AM EDT by telephone and verified that I am speaking with the correct person using two identifiers.   I discussed the limitations, risks, security and privacy concerns of performing an evaluation and management service by telephone and the availability of in person appointments. I also discussed with the patient that there may be a patient responsible charge related to this service. The patient expressed understanding and agreed to proceed.  Location patient: home Location provider: work or home office Participants present for the call: patient, provider, wife Patient did not have a visit in the prior 7 days to address this/these issue(s).   History of Present Illness: 86 year old male who  has a past medical history of Acute rhinitis (08/10/2017), Acute stress disorder (10/27/2007), Anxiety (07/14/2018), Arthritis, Bilateral epiphora (09/25/2015), Blush (09/23/2007), Cancer ([place]), Chest pain in adult (12/16/2016), Chronic right-sided thoracic back pain (12/16/2016), Compression fracture of body of thoracic vertebra ([place]) (02/09/2019), Dysphagia (07/17/2010), Eustachian tube dysfunction, bilateral (08/10/2017), Ganglion of joint (04/22/2007), Gastroesophageal reflux disease (05/15/2016), Headache (01/30/2016), Hemorrhoids, HEMORRHOIDS, INTERNAL (02/18/2007), IPF (idiopathic pulmonary fibrosis) ([place]) (07/14/2018), Lateral epicondylitis (tennis elbow), Lingular mass (09/07/2015), Mass of lingula of lung (09/07/2015), Mass of lower lobe of left lung (09/07/2015), Mediastinal adenopathy (09/07/2015), Mediastinal lymphadenopathy (08/27/2015), Multiple pulmonary nodules (08/27/2015), Nausea and vomiting (05/15/2016), Non-small cell cancer of left lung ([place]) (09/24/2015), Non-small cell lung cancer (NSCLC) ([place]) (dx'f 07/30/15), Nuclear sclerosis of right eye (09/25/2015), Odynophagia (11/06/2015), Osteoarthritis (07/17/2010), Persistent headaches (02/03/2017),  Pleural effusion on left (08/14/2016), Pseudophakia, left eye (09/25/2015), Pulmonary emphysema ([place]) (08/14/2016), 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  TCM visit   Admit Date 12/28/2019 Discharge Date 01/04/2020  He presented to [facility] emergency room on 12/28/2019 with progressively worsening shortness of breath for several weeks prior and acute decompensation that morning with hypoxia that prompted his wife to call EMS.  Upon arrival patient was on 12 L of oxygen, was treated with albuterol, Atrovent DuoNeb, and Solu-Medrol 125 mg IV.  His oxygen demand had improved to 4 L.  Last x-ray revealed a right middle lobe opacity concerning for pneumonia, density in left upper lung secondary to chronic fibrotic changes.  He met SIRS criteria, later sepsis due to hypoxia, WBC of 13.7, BP 102/62 pulse 111, respirations 26.  He was Covid negative, cultures were obtained and broad-spectrum antibiotics of Rocephin and doxy were initiated.  He was admitted in the ICU and continued to have significant hypoxia with exertion.  Patient was managed by PCCM.  Patient continued to improve slowly and his oxygen requirements had reduced to 2 L.  He had completed 8 days of IV antibiotics and was cleared from pulmonary to be discharged home.  He was discharged on doxycycline for 2 additional days to complete the total of 10-day treatment.  He had a brief episode of hematuria and urology was consulted.  CT scan showed nonobstructing kidney stone and needed to be followed up outpatient and hematuria had resolved upon discharge.  Was still requiring 4 L of oxygen with saturations of 92% upon discharge and had to go up briefly to 5 L after exertion.  Today he and his wife report that at rest his oxygen saturations are in the 92 to 96% with a pulse ox in the  90s.  Once he starts exerting himself  his pulse goes up into the 112 115's and his pulse ox drops to around 60 to 70%.  He was discharged home with a prescription for 4 L of oxygen but his take currently only goes up to 3 L.  Pulmonary has been working to get him a new oxygen tank.  Pulmonary also started him on a prior increased dose of prednisone, this will be his second day of taking increased dose.  There has not been much improvement yet.  He continues to feel weak.  Has finished his p.o. antibiotics.  Denies fevers, chills, nausea, vomiting, or diarrhea.  He has an upcoming appointment with pulmonary January 24, 2020   Observations/Objective: Patient sounds fatigued and short of breath on the phone.  Speech and thought processing are grossly intact. Patient reported vitals:  Assessment and Plan: 1. IPF (idiopathic pulmonary fibrosis) ([place]) -Reviewed hospital discharge notes, imaging, and labs.  All questions answered to the best of my ability -Follow-up with pulmonary if they have not heard anything about new oxygen tank by the end of the day.  Continue with increased dose of steroids.  Follow-up with pulmonary on January 24, 2020.  Can follow-up with me sooner if needed  2. Fatigue, unspecified type - Chronic   3. Sepsis, due to unspecified organism, unspecified whether acute organ dysfunction present ([place]) - Resolved   4. Anxiety - Continue with SSRI    Follow Up Instructions:  I did not refer this patient for an OV in the next 24 hours for this/these issue(s).  I discussed the assessment and treatment plan with the patient. The patient was provided an opportunity to ask questions and all were answered. The patient agreed with the plan and demonstrated an understanding of the instructions.   The patient was advised to call back or seek an in-person evaluation if the symptoms worsen or if the condition fails to improve as anticipated.  I provided 30 minutes of non-face-to-face  time during this encounter.   [name], NP

## 2020-01-10 NOTE — Telephone Encounter (Signed)
Agree with plan Recent hospitalization with PNA Agree with PCP evaluation Jason Furbish, MD

## 2020-01-10 NOTE — Telephone Encounter (Signed)
Left message for patient to call back.

## 2020-01-10 NOTE — Progress Notes (Deleted)
Subjective:    Patient ID: Jason Summers, male    DOB: [birth year], 86 y.o.   MRN: [id number]  HPI 86 year old male who  has a past medical history of Acute rhinitis (08/10/2017), Acute stress disorder (10/27/2007), Anxiety (07/14/2018), Arthritis, Bilateral epiphora (09/25/2015), Blush (09/23/2007), Cancer ([place]), Chest pain in adult (12/16/2016), Chronic right-sided thoracic back pain (12/16/2016), Compression fracture of body of thoracic vertebra ([place]) (02/09/2019), Dysphagia (07/17/2010), Eustachian tube dysfunction, bilateral (08/10/2017), Ganglion of joint (04/22/2007), Gastroesophageal reflux disease (05/15/2016), Headache (01/30/2016), Hemorrhoids, HEMORRHOIDS, INTERNAL (02/18/2007), IPF (idiopathic pulmonary fibrosis) ([place]) (07/14/2018), Lateral epicondylitis (tennis elbow), Lingular mass (09/07/2015), Mass of lingula of lung (09/07/2015), Mass of lower lobe of left lung (09/07/2015), Mediastinal adenopathy (09/07/2015), Mediastinal lymphadenopathy (08/27/2015), Multiple pulmonary nodules (08/27/2015), Nausea and vomiting (05/15/2016), Non-small cell cancer of left lung ([place]) (09/24/2015), Non-small cell lung cancer (NSCLC) ([place]) (dx'f 07/30/15), Nuclear sclerosis of right eye (09/25/2015), Odynophagia (11/06/2015), Osteoarthritis (07/17/2010), Persistent headaches (02/03/2017), Pleural effusion on left (08/14/2016), Pseudophakia, left eye (09/25/2015), Pulmonary emphysema ([place]) (08/14/2016), Radiation fibrosis of lung ([place]) (08/14/2016), Shortness of breath (08/14/2016), Soft tissue lesion of shoulder region (04/22/2007), Spondylosis of cervical joint, and SPRAIN AND STRAIN OF SACROILIAC (09/06/2008).    Review of Systems     Objective:   Physical Exam        Assessment & Plan:

## 2020-01-10 NOTE — Telephone Encounter (Signed)
Will notified to proceed with orders for OT.  Nothing further needed.

## 2020-01-10 NOTE — Telephone Encounter (Signed)
Spoke with pt's wife who states yesterday they were told to take prednisone and increase O2 to 4L (see phone note from 01/09/20). [name] stated O2 tank regulator only went to 3L. Pt's DME company in [place]. Melissa from [employer] left message to follow up with pt and his wife concerning tanks. Pt's was informed office would call back when call was returned from [place] and solution was found.

## 2020-01-10 NOTE — Telephone Encounter (Signed)
Patient's wife called back. Informed her of Dr. Marlou Porch recommendations. Patient's wife verbalized understanding.

## 2020-01-10 NOTE — Telephone Encounter (Signed)
Ok for verbal orders ?

## 2020-01-11 ENCOUNTER — Other Ambulatory Visit: Payer: Self-pay

## 2020-01-11 ENCOUNTER — Encounter: Payer: Self-pay | Admitting: Primary Care

## 2020-01-11 ENCOUNTER — Telehealth: Payer: Self-pay | Admitting: Internal Medicine

## 2020-01-11 ENCOUNTER — Ambulatory Visit (INDEPENDENT_AMBULATORY_CARE_PROVIDER_SITE_OTHER): Payer: Medicare Other | Admitting: Primary Care

## 2020-01-11 DIAGNOSIS — J9611 Chronic respiratory failure with hypoxia: Secondary | ICD-10-CM

## 2020-01-11 DIAGNOSIS — J84112 Idiopathic pulmonary fibrosis: Secondary | ICD-10-CM

## 2020-01-11 DIAGNOSIS — J189 Pneumonia, unspecified organism: Secondary | ICD-10-CM

## 2020-01-11 MED ORDER — METOPROLOL TARTRATE 25 MG PO TABS: 12.5000 mg | ORAL_TABLET | Freq: Two times a day (BID) | ORAL | 0 refills | Status: DC

## 2020-01-11 NOTE — Patient Instructions (Signed)
-  Continue OFEV 1 tab twice daily - Increased prednisone from 54m to 387muntil follow-up with Dr. RaAlveria Apley- Continue 4L oxygen, notify office if <88%  - Will send in RX for metroprolol 12.39m73mwice daily (HOLD medication if HR <60) - We will place an order for home health to evaluate and treat. He will need help setting up transportable for his next OV.   If cough worsens, develops fever or increased oxygen demand call office or present to ED   Follow-up Aug 17th with Dr. RamChase Caller

## 2020-01-11 NOTE — Progress Notes (Signed)
Virtual Visit via Telephone Note  I connected with Jason Summers on 01/11/20 at 12:00 PM EDT by telephone and verified that I am speaking with the correct person using two identifiers.  Location: Patient: Home Provider: Office   I discussed the limitations, risks, security and privacy concerns of performing an evaluation and management service by telephone and the availability of in person appointments. I also discussed with the patient that there may be a patient responsible charge related to this service. The patient expressed understanding and agreed to proceed.   History of Present Illness: 86 year old male, former smoker quit in 2012 (62 pack year history).  Past medical history significant for IPF, non-small cell lung cancer, pleural effusion on left, pulmonary emphysema, radiation fibrosis of the lung, mediastinal lymphadenopathy, pulmonary hypertension, GERD.  Patient of Jason Summers. Maintained on OFEV, Incruse Ellipta and prednisone 50m daily.   Recently admitted from 12/28/19-01/04/20 for sepsis and acute hypoxic respiratory failure secondary to IPF exacerbation and suspected pneumonia. On admission patient met sepsis criteria (leukocytosis 13.7, hypoxia, tachycardia, tachypnea). Received IVF, IV antibiotics. O2 demand improved from 15L to 4L. CTA was negative for PE; ground glass appearance questionable infiltrate. Troponin 34 likely from demand ischemia, irregular rhythm on telemetry concerning for aib without known history. Lopressor as needed for HR > 110. EKG revealed sinus tachycardia with PVCs.He is on OFEV which is currently on hold pending PCCM recommendations.  Patient discharged with doxycycline BID for additional two days and was on 4L oxygen satting 92%.   01/11/2020- Interim hx Patient contacted today for telephone visit.  Accompanied by patient wife Jason Summers his son on phone call today. Wife feels he looks some worse since being discharged. He is very tired, especially  walking to bathroom. She states that as soon as he does anything he is exhausted. He is unable to brush his teeth. He is eating ok but not drinking as much as he probably should be. He is taking OFEV twice a day. He has a very little cough with some green mucus.He is satting 94-95%% on 4L. New tanks [facility] from [facility] is currently on prednisone 356mdaily. He has a follow-up scheduled on August 17th with Dr. RaChase CallerThey will need transportation for any office visits, wife is unable to do herself. Denies fever, chills, sweats, chest pain, nausea or vomiting.     Observations/Objective:  - Family speaking for patient   Patient reported vitals:  O2 95-96% 4L  HR 97 at rest; HR 120 with ambulation  Temp 98.0   Assessment and Plan:  CAP: - Hospitalized from 7/21-7/28 with sepsis secondary to suspected pneumonia. Treated with IV fluids and antibiotics. Discharged on doxycycline and 4L oxygen. Continues to have some weakness and fatigue. Cough is productive with occasional green mucus. He remains afebrile. Unable to come into office for visit. We are not able to repeat CXR or re-check labs to monitor. We will place an order for home health to evaluate and treat. He will need help setting up transportable for his next OV.   IPF: - Continue OFEV 1 tab twice daily - Increased prednisone from 1563mo 57m33mtil follow-up with Dr. RamsAlveria Apleyespiratory failure - Remains on 4L oxygen sating 95% (patient to notify office if <88%) - Received new tanks from [employer]   Tachycardia: - Patient reported HR 97 at rest; 110-120 on exertion (Sinus rhythm on most recent ECG) - Encourage oral hydration - Will send in RX for metroprolol 12.5mg 51mce daily (  HOLD medication if HR <60)  Follow Up Instructions:   - Keep follow-up with Jason Summers August 15th  I discussed the assessment and treatment plan with the patient. The patient was provided an opportunity to ask questions and all were  answered. The patient agreed with the plan and demonstrated an understanding of the instructions.   The patient was advised to call back or seek an in-person evaluation if the symptoms worsen or if the condition fails to improve as anticipated.  I provided 28 minutes of non-face-to-face time during this encounter.   Martyn Ehrich, NP

## 2020-01-11 NOTE — Telephone Encounter (Signed)
Dr. Chase Caller there is nothing available with the office until the patients appointment with you on 8/17. Looks like he has a International aid/development worker today with Beth at [facility] and it is currently in progress. Nothing further needed at this time.

## 2020-01-11 NOTE — Telephone Encounter (Signed)
He is post dc at home . Fu with me is still 2 weeks awa. Now 1 week approx since dc. Unable to do PT due to tachycardia, dyspnea, 4L [place]. Son (neighbor) asking for earlier followup.   Plan  - see an app this week  - then can decide if keep my appt with me mid aug or move to setp -  30 min slot

## 2020-01-12 ENCOUNTER — Telehealth: Payer: Self-pay | Admitting: Primary Care

## 2020-01-12 ENCOUNTER — Other Ambulatory Visit: Payer: Self-pay | Admitting: Primary Care

## 2020-01-12 ENCOUNTER — Telehealth: Payer: Self-pay | Admitting: Internal Medicine

## 2020-01-12 DIAGNOSIS — J84112 Idiopathic pulmonary fibrosis: Secondary | ICD-10-CM

## 2020-01-12 MED ORDER — METOPROLOL TARTRATE 25 MG PO TABS: 12.5000 mg | ORAL_TABLET | Freq: Two times a day (BID) | ORAL | 0 refills | Status: DC

## 2020-01-12 MED ORDER — PREDNISONE 10 MG PO TABS: 30.0000 mg | ORAL_TABLET | Freq: Every day | ORAL | 0 refills | Status: DC

## 2020-01-12 NOTE — Telephone Encounter (Signed)
Order for [facility] services clarified and sent to [employer].  Nothing further needed.

## 2020-01-12 NOTE — Telephone Encounter (Signed)
Spoke with patient's wife, Mickel Baas, listed in [place].  Verified that they use [employer] for his home oxygen, so orders for home health services will be sent to [employer] once we get clarification from the NP.  I let Mickel Baas know that we are waiting for clarification of exactly what services the patient requires.  The patient's wife states they brought out a new concentrator, but did not pick up the old one that only goes to 3L.  She does not want to get charged for the original machine.  I let her know that I would call [employer] and request that they pick up the old machine.  She states when she calls, she had a hard time getting through.  I apologized and let her know if that continued to let us know so we could pass that on to the coordinator that comes to our office.  She was appreciative and verbalized understanding.  Called [employer] and spoke with Ashontie, advised that patient had the original oxygen concentrator in the home that only goes to 3L and needs it picked up so they are not charged for two concentrators.  She said she would schedule a pickup.

## 2020-01-12 NOTE — Telephone Encounter (Signed)
He mainly needs nursing but may benefit from PT if they feel Jason Summers

## 2020-01-12 NOTE — Telephone Encounter (Signed)
Spoke with pt's wife who requested meds from ov on 01/11/20 be called into [facility] on [address]. Meds were called in and pt stated understanding. Nothing further needed at this time.

## 2020-01-12 NOTE — Telephone Encounter (Signed)
Spoke with Jason Summers with ID  She states they received referral stating needs home health nursing  Referral made in error  I advised ok to cancel  I have placed correct order-+

## 2020-01-12 NOTE — Telephone Encounter (Signed)
Good morning, I just need clarification on the home health order from yesterday for this patient, does he need a nurse, PT, both.  Please clarify so we can send the order to the home health company.  Thank you.

## 2020-01-13 ENCOUNTER — Telehealth: Payer: Self-pay | Admitting: Primary Care

## 2020-01-16 ENCOUNTER — Inpatient Hospital Stay (HOSPITAL_COMMUNITY)
Admission: EM | Admit: 2020-01-16 | Discharge: 2020-02-08 | DRG: 193 | Disposition: E | Payer: Medicare Other | Attending: Internal Medicine | Admitting: Internal Medicine

## 2020-01-16 ENCOUNTER — Encounter (HOSPITAL_COMMUNITY): Payer: Self-pay | Admitting: Student

## 2020-01-16 ENCOUNTER — Other Ambulatory Visit (HOSPITAL_COMMUNITY): Payer: Medicare Other

## 2020-01-16 ENCOUNTER — Inpatient Hospital Stay (HOSPITAL_COMMUNITY): Payer: Medicare Other

## 2020-01-16 ENCOUNTER — Other Ambulatory Visit: Payer: Self-pay

## 2020-01-16 ENCOUNTER — Emergency Department (HOSPITAL_COMMUNITY): Payer: Medicare Other

## 2020-01-16 DIAGNOSIS — Z9221 Personal history of antineoplastic chemotherapy: Secondary | ICD-10-CM

## 2020-01-16 DIAGNOSIS — Z7951 Long term (current) use of inhaled steroids: Secondary | ICD-10-CM | POA: Diagnosis not present

## 2020-01-16 DIAGNOSIS — Z85118 Personal history of other malignant neoplasm of bronchus and lung: Secondary | ICD-10-CM | POA: Diagnosis not present

## 2020-01-16 DIAGNOSIS — Z66 Do not resuscitate: Secondary | ICD-10-CM | POA: Diagnosis not present

## 2020-01-16 DIAGNOSIS — J439 Emphysema, unspecified: Secondary | ICD-10-CM | POA: Diagnosis present

## 2020-01-16 DIAGNOSIS — Z20822 Contact with and (suspected) exposure to covid-19: Secondary | ICD-10-CM | POA: Diagnosis present

## 2020-01-16 DIAGNOSIS — J84112 Idiopathic pulmonary fibrosis: Secondary | ICD-10-CM | POA: Diagnosis present

## 2020-01-16 DIAGNOSIS — R0600 Dyspnea, unspecified: Secondary | ICD-10-CM

## 2020-01-16 DIAGNOSIS — I10 Essential (primary) hypertension: Secondary | ICD-10-CM | POA: Diagnosis present

## 2020-01-16 DIAGNOSIS — F411 Generalized anxiety disorder: Secondary | ICD-10-CM

## 2020-01-16 DIAGNOSIS — Z923 Personal history of irradiation: Secondary | ICD-10-CM | POA: Diagnosis not present

## 2020-01-16 DIAGNOSIS — E785 Hyperlipidemia, unspecified: Secondary | ICD-10-CM | POA: Diagnosis present

## 2020-01-16 DIAGNOSIS — Y95 Nosocomial condition: Secondary | ICD-10-CM | POA: Diagnosis present

## 2020-01-16 DIAGNOSIS — Z888 Allergy status to other drugs, medicaments and biological substances status: Secondary | ICD-10-CM | POA: Diagnosis not present

## 2020-01-16 DIAGNOSIS — R54 Age-related physical debility: Secondary | ICD-10-CM | POA: Diagnosis present

## 2020-01-16 DIAGNOSIS — F418 Other specified anxiety disorders: Secondary | ICD-10-CM | POA: Diagnosis present

## 2020-01-16 DIAGNOSIS — K219 Gastro-esophageal reflux disease without esophagitis: Secondary | ICD-10-CM | POA: Diagnosis present

## 2020-01-16 DIAGNOSIS — Z7189 Other specified counseling: Secondary | ICD-10-CM | POA: Diagnosis not present

## 2020-01-16 DIAGNOSIS — M1990 Unspecified osteoarthritis, unspecified site: Secondary | ICD-10-CM | POA: Diagnosis present

## 2020-01-16 DIAGNOSIS — J9621 Acute and chronic respiratory failure with hypoxia: Secondary | ICD-10-CM | POA: Diagnosis present

## 2020-01-16 DIAGNOSIS — Z881 Allergy status to other antibiotic agents status: Secondary | ICD-10-CM

## 2020-01-16 DIAGNOSIS — J9611 Chronic respiratory failure with hypoxia: Secondary | ICD-10-CM

## 2020-01-16 DIAGNOSIS — Z515 Encounter for palliative care: Secondary | ICD-10-CM | POA: Diagnosis not present

## 2020-01-16 DIAGNOSIS — R748 Abnormal levels of other serum enzymes: Secondary | ICD-10-CM

## 2020-01-16 DIAGNOSIS — E7800 Pure hypercholesterolemia, unspecified: Secondary | ICD-10-CM | POA: Diagnosis present

## 2020-01-16 DIAGNOSIS — J9601 Acute respiratory failure with hypoxia: Secondary | ICD-10-CM | POA: Diagnosis not present

## 2020-01-16 DIAGNOSIS — Z8249 Family history of ischemic heart disease and other diseases of the circulatory system: Secondary | ICD-10-CM | POA: Diagnosis not present

## 2020-01-16 DIAGNOSIS — I272 Pulmonary hypertension, unspecified: Secondary | ICD-10-CM | POA: Diagnosis present

## 2020-01-16 DIAGNOSIS — F419 Anxiety disorder, unspecified: Secondary | ICD-10-CM | POA: Diagnosis not present

## 2020-01-16 DIAGNOSIS — Z87891 Personal history of nicotine dependence: Secondary | ICD-10-CM | POA: Diagnosis not present

## 2020-01-16 DIAGNOSIS — R0609 Other forms of dyspnea: Secondary | ICD-10-CM

## 2020-01-16 DIAGNOSIS — J189 Pneumonia, unspecified organism: Principal | ICD-10-CM | POA: Diagnosis present

## 2020-01-16 LAB — CBC WITH DIFFERENTIAL/PLATELET
Abs Immature Granulocytes: 0.03 10*3/uL (ref 0.00–0.07)
Basophils Absolute: 0 10*3/uL (ref 0.0–0.1)
Basophils Relative: 0 %
Eosinophils Absolute: 0.1 10*3/uL (ref 0.0–0.5)
Eosinophils Relative: 1 %
HCT: 44.9 % (ref 39.0–52.0)
Hemoglobin: 14.1 g/dL (ref 13.0–17.0)
Immature Granulocytes: 0 %
Lymphocytes Relative: 6 %
Lymphs Abs: 0.6 10*3/uL — ABNORMAL LOW (ref 0.7–4.0)
MCH: 29 pg (ref 26.0–34.0)
MCHC: 31.4 g/dL (ref 30.0–36.0)
MCV: 92.2 fL (ref 80.0–100.0)
Monocytes Absolute: 0.6 10*3/uL (ref 0.1–1.0)
Monocytes Relative: 7 %
Neutro Abs: 7.4 10*3/uL (ref 1.7–7.7)
Neutrophils Relative %: 86 %
Platelets: 179 10*3/uL (ref 150–400)
RBC: 4.87 MIL/uL (ref 4.22–5.81)
RDW: 13.7 % (ref 11.5–15.5)
WBC: 8.7 10*3/uL (ref 4.0–10.5)
nRBC: 0 % (ref 0.0–0.2)

## 2020-01-16 LAB — COMPREHENSIVE METABOLIC PANEL
ALT: 144 U/L — ABNORMAL HIGH (ref 0–44)
AST: 109 U/L — ABNORMAL HIGH (ref 15–41)
Albumin: 2.7 g/dL — ABNORMAL LOW (ref 3.5–5.0)
Alkaline Phosphatase: 73 U/L (ref 38–126)
Anion gap: 13 (ref 5–15)
BUN: 13 mg/dL (ref 8–23)
CO2: 31 mmol/L (ref 22–32)
Calcium: 8.7 mg/dL — ABNORMAL LOW (ref 8.9–10.3)
Chloride: 95 mmol/L — ABNORMAL LOW (ref 98–111)
Creatinine, Ser: 0.9 mg/dL (ref 0.61–1.24)
GFR calc Af Amer: >60 mL/min (ref >60)
GFR calc non Af Amer: >60 mL/min (ref >60)
Glucose, Bld: 95 mg/dL (ref 70–99)
Potassium: 3.9 mmol/L (ref 3.5–5.1)
Sodium: 139 mmol/L (ref 135–145)
Total Bilirubin: 0.8 mg/dL (ref 0.3–1.2)
Total Protein: 6.2 g/dL — ABNORMAL LOW (ref 6.5–8.1)

## 2020-01-16 LAB — I-STAT ARTERIAL BLOOD GAS, ED
Acid-Base Excess: 7 mmol/L — ABNORMAL HIGH (ref 0.0–2.0)
Bicarbonate: 32.2 mmol/L — ABNORMAL HIGH (ref 20.0–28.0)
Calcium, Ion: 1.17 mmol/L (ref 1.15–1.40)
HCT: 39 % (ref 39.0–52.0)
Hemoglobin: 13.3 g/dL (ref 13.0–17.0)
O2 Saturation: 95 %
Potassium: 3.7 mmol/L (ref 3.5–5.1)
Sodium: 136 mmol/L (ref 135–145)
TCO2: 34 mmol/L — ABNORMAL HIGH (ref 22–32)
pCO2 arterial: 44.5 mmHg (ref 32.0–48.0)
pH, Arterial: 7.467 — ABNORMAL HIGH (ref 7.350–7.450)
pO2, Arterial: 73 mmHg — ABNORMAL LOW (ref 83.0–108.0)

## 2020-01-16 LAB — CBC
HCT: 42.4 % (ref 39.0–52.0)
Hemoglobin: 13.4 g/dL (ref 13.0–17.0)
MCH: 29.2 pg (ref 26.0–34.0)
MCHC: 31.6 g/dL (ref 30.0–36.0)
MCV: 92.4 fL (ref 80.0–100.0)
Platelets: 168 10*3/uL (ref 150–400)
RBC: 4.59 MIL/uL (ref 4.22–5.81)
RDW: 13.7 % (ref 11.5–15.5)
WBC: 7.8 10*3/uL (ref 4.0–10.5)
nRBC: 0 % (ref 0.0–0.2)

## 2020-01-16 LAB — PROTIME-INR
INR: 1.1 (ref 0.8–1.2)
Prothrombin Time: 13.9 seconds (ref 11.4–15.2)

## 2020-01-16 LAB — LIPID PANEL
Cholesterol: 128 mg/dL (ref 0–200)
HDL: 56 mg/dL (ref >40)
LDL Cholesterol: 57 mg/dL (ref 0–99)
Total CHOL/HDL Ratio: 2.3 RATIO
Triglycerides: 77 mg/dL (ref <150)
VLDL: 15 mg/dL (ref 0–40)

## 2020-01-16 LAB — URINALYSIS, ROUTINE W REFLEX MICROSCOPIC
Bilirubin Urine: NEGATIVE
Glucose, UA: NEGATIVE mg/dL
Hgb urine dipstick: NEGATIVE
Ketones, ur: 5 mg/dL — AB
Leukocytes,Ua: NEGATIVE
Nitrite: NEGATIVE
Protein, ur: NEGATIVE mg/dL
Specific Gravity, Urine: 1.019 (ref 1.005–1.030)
pH: 7 (ref 5.0–8.0)

## 2020-01-16 LAB — PHOSPHORUS: Phosphorus: 2.3 mg/dL — ABNORMAL LOW (ref 2.5–4.6)

## 2020-01-16 LAB — HEPATITIS PANEL, ACUTE
HCV Ab: NONREACTIVE
Hep A IgM: NONREACTIVE
Hep B C IgM: NONREACTIVE
Hepatitis B Surface Ag: NONREACTIVE

## 2020-01-16 LAB — STREP PNEUMONIAE URINARY ANTIGEN: Strep Pneumo Urinary Antigen: NEGATIVE

## 2020-01-16 LAB — CREATININE, SERUM
Creatinine, Ser: 0.89 mg/dL (ref 0.61–1.24)
GFR calc Af Amer: >60 mL/min (ref >60)
GFR calc non Af Amer: >60 mL/min (ref >60)

## 2020-01-16 LAB — BRAIN NATRIURETIC PEPTIDE: B Natriuretic Peptide: 114.1 pg/mL — ABNORMAL HIGH (ref 0.0–100.0)

## 2020-01-16 LAB — SARS CORONAVIRUS 2 BY RT PCR (HOSPITAL ORDER, PERFORMED IN ~~LOC~~ HOSPITAL LAB): SARS Coronavirus 2: NEGATIVE

## 2020-01-16 LAB — LACTIC ACID, PLASMA
Lactic Acid, Venous: 1.3 mmol/L (ref 0.5–1.9)
Lactic Acid, Venous: 1.9 mmol/L (ref 0.5–1.9)

## 2020-01-16 LAB — MAGNESIUM: Magnesium: 2.1 mg/dL (ref 1.7–2.4)

## 2020-01-16 LAB — PROCALCITONIN: Procalcitonin: <0.1 ng/mL

## 2020-01-16 LAB — APTT: aPTT: 27 seconds (ref 24–36)

## 2020-01-16 MED ORDER — VANCOMYCIN HCL 750 MG/150ML IV SOLN
750.0000 mg | Freq: Two times a day (BID) | INTRAVENOUS | Status: DC
  Administered 2020-01-17 – 2020-01-18 (×4): 750 mg via INTRAVENOUS
  Filled 2020-01-16 (×4): qty 150

## 2020-01-16 MED ORDER — ALPRAZOLAM 0.25 MG PO TABS
0.2500 mg | ORAL_TABLET | Freq: Every day | ORAL | Status: DC | PRN
  Administered 2020-01-16 – 2020-01-17 (×2): 0.25 mg via ORAL
  Filled 2020-01-16 (×2): qty 1

## 2020-01-16 MED ORDER — UMECLIDINIUM BROMIDE 62.5 MCG/INH IN AEPB
1.0000 | INHALATION_SPRAY | Freq: Every day | RESPIRATORY_TRACT | Status: DC
  Administered 2020-01-17 – 2020-01-29 (×12): 1 via RESPIRATORY_TRACT
  Filled 2020-01-16 (×2): qty 7

## 2020-01-16 MED ORDER — ALBUTEROL SULFATE (2.5 MG/3ML) 0.083% IN NEBU: 3.0000 mL | INHALATION_SOLUTION | Freq: Four times a day (QID) | RESPIRATORY_TRACT | Status: DC | PRN

## 2020-01-16 MED ORDER — ONDANSETRON HCL 4 MG/2ML IJ SOLN: 4.0000 mg | Freq: Four times a day (QID) | INTRAMUSCULAR | Status: DC | PRN

## 2020-01-16 MED ORDER — ENOXAPARIN SODIUM 40 MG/0.4ML ~~LOC~~ SOLN
40.0000 mg | SUBCUTANEOUS | Status: DC
  Administered 2020-01-16 – 2020-01-21 (×5): 40 mg via SUBCUTANEOUS
  Filled 2020-01-16 (×6): qty 0.4

## 2020-01-16 MED ORDER — METHYLPREDNISOLONE SODIUM SUCC 125 MG IJ SOLR
125.0000 mg | Freq: Once | INTRAMUSCULAR | Status: AC
  Administered 2020-01-16: 125 mg via INTRAVENOUS
  Filled 2020-01-16: qty 2

## 2020-01-16 MED ORDER — METHYLPREDNISOLONE SODIUM SUCC 125 MG IJ SOLR
60.0000 mg | Freq: Two times a day (BID) | INTRAMUSCULAR | Status: DC
  Administered 2020-01-17 – 2020-01-22 (×10): 60 mg via INTRAVENOUS
  Filled 2020-01-16 (×10): qty 2

## 2020-01-16 MED ORDER — CITALOPRAM HYDROBROMIDE 20 MG PO TABS
10.0000 mg | ORAL_TABLET | Freq: Every day | ORAL | Status: DC
  Administered 2020-01-16 – 2020-01-29 (×14): 10 mg via ORAL
  Filled 2020-01-16 (×14): qty 1

## 2020-01-16 MED ORDER — SODIUM CHLORIDE 0.9 % IV SOLN
2.0000 g | Freq: Once | INTRAVENOUS | Status: AC
  Administered 2020-01-16: 2 g via INTRAVENOUS
  Filled 2020-01-16: qty 2

## 2020-01-16 MED ORDER — ACETAMINOPHEN 650 MG RE SUPP: 650.0000 mg | Freq: Four times a day (QID) | RECTAL | Status: DC | PRN

## 2020-01-16 MED ORDER — ONDANSETRON HCL 4 MG PO TABS: 4.0000 mg | ORAL_TABLET | Freq: Four times a day (QID) | ORAL | Status: DC | PRN

## 2020-01-16 MED ORDER — ROSUVASTATIN CALCIUM 5 MG PO TABS
5.0000 mg | ORAL_TABLET | Freq: Every day | ORAL | Status: DC
  Administered 2020-01-16 – 2020-01-20 (×5): 5 mg via ORAL
  Filled 2020-01-16 (×5): qty 1

## 2020-01-16 MED ORDER — PANTOPRAZOLE SODIUM 40 MG PO TBEC
80.0000 mg | DELAYED_RELEASE_TABLET | Freq: Every day | ORAL | Status: DC
  Administered 2020-01-16 – 2020-01-29 (×14): 80 mg via ORAL
  Filled 2020-01-16 (×15): qty 2

## 2020-01-16 MED ORDER — ALBUTEROL SULFATE HFA 108 (90 BASE) MCG/ACT IN AERS
2.0000 | INHALATION_SPRAY | Freq: Once | RESPIRATORY_TRACT | Status: AC
  Administered 2020-01-16: 2 via RESPIRATORY_TRACT
  Filled 2020-01-16: qty 6.7

## 2020-01-16 MED ORDER — ACETAMINOPHEN 325 MG PO TABS
650.0000 mg | ORAL_TABLET | Freq: Four times a day (QID) | ORAL | Status: DC | PRN
  Administered 2020-01-29: 650 mg via ORAL
  Filled 2020-01-16: qty 2

## 2020-01-16 MED ORDER — METOPROLOL TARTRATE 12.5 MG HALF TABLET
12.5000 mg | ORAL_TABLET | Freq: Two times a day (BID) | ORAL | Status: DC
  Administered 2020-01-16 – 2020-01-29 (×27): 12.5 mg via ORAL
  Filled 2020-01-16 (×27): qty 1

## 2020-01-16 MED ORDER — OXYCODONE HCL 5 MG PO TABS
5.0000 mg | ORAL_TABLET | ORAL | Status: DC | PRN
  Administered 2020-01-16 – 2020-01-23 (×3): 5 mg via ORAL
  Filled 2020-01-16 (×3): qty 1

## 2020-01-16 MED ORDER — SODIUM CHLORIDE 0.9 % IV SOLN
2.0000 g | Freq: Two times a day (BID) | INTRAVENOUS | Status: AC
  Administered 2020-01-16 – 2020-01-22 (×13): 2 g via INTRAVENOUS
  Filled 2020-01-16 (×13): qty 2

## 2020-01-16 MED ORDER — VANCOMYCIN HCL 1500 MG/300ML IV SOLN
1500.0000 mg | Freq: Once | INTRAVENOUS | Status: AC
  Administered 2020-01-16: 1500 mg via INTRAVENOUS
  Filled 2020-01-16: qty 300

## 2020-01-16 MED ORDER — LORAZEPAM 2 MG/ML IJ SOLN
0.5000 mg | Freq: Once | INTRAMUSCULAR | Status: AC
  Administered 2020-01-16: 0.5 mg via INTRAVENOUS
  Filled 2020-01-16: qty 1

## 2020-01-16 MED ORDER — AEROCHAMBER PLUS FLO-VU LARGE MISC
1.0000 | Freq: Once | Status: AC
  Administered 2020-01-16: 1

## 2020-01-16 MED ORDER — CALCIUM CARBONATE ANTACID 500 MG PO CHEW: 1.0000 | CHEWABLE_TABLET | Freq: Two times a day (BID) | ORAL | Status: DC | PRN

## 2020-01-16 MED ORDER — NINTEDANIB ESYLATE 150 MG PO CAPS
150.0000 mg | ORAL_CAPSULE | Freq: Two times a day (BID) | ORAL | Status: DC
  Administered 2020-01-17 – 2020-01-29 (×23): 150 mg via ORAL
  Filled 2020-01-16 (×29): qty 1

## 2020-01-16 NOTE — H&P (Signed)
History and Physical    Jason Summers ZOX:[id number] DOB: 03/27/1932 DOA: 01/19/2020  PCP: [name], NP  Patient coming from: home  I have personally briefly reviewed patient's old medical records in [place]  Chief Complaint: Worsening shortness of breath   HPI: Jason Summers is a 86 y.o. male with medical history significant of lung cancer-in remission, post chemo and radiation therapy, idiopathic pulmonary fibrosis on chronic steroids andNintendanib, sinus tachycardia, hyperlipidemia, depression/anxiety, GERD presents to emergency department with worsening shortness of breath since couple of days.  Patient's wife at the bedside tells me that patient was hospitalized from 12/28/2019 to 01/04/2020 with sepsis/acute hypoxic respiratory failure suspect secondary to IPF exacerbation and CAP and discharged home in a stable condition on p.o. doxy.  Patient's wife tells me that since his discharge from the hospital patient has progressive worsening shortness of breath, he is unable to walk,and this morning he was unable to catch his breath therefore she called EMS and brought patient to the emergency department for further evaluation and management.   Patient's wife tells me that patient had tachycardia at home therefore his PCP started him on metoprolol 12.5 mg twice daily which helps with his tachycardia.  His prednisone dose was also increased from 15 mg to 30 mg.  Patient has chronic productive cough with white phlegm however denies change in phlegm color, worsening of cough, wheezing, chest pain, leg swelling, orthopnea, PND, fever, chills, nausea, vomiting, headache, blurry vision, decreased appetite, abdominal pain.  Reports loose stools since last 2 days however denies melena.  No history of smoking, alcohol, illicit drug use.  He uses 4 L of oxygen at baseline at home.  ED Course: Upon arrival to ED: Patient tachypneic, requiring 4 L of oxygen via nasal cannula which is his baseline,  afebrile with no leukocytosis.  Initial labs including CBC: WNL CMP shows elevated liver enzymes of AST: 109, ALT: 144, BNP: 114, COVID-19 negative, chest x-ray shows chronic lung disease with suspected superimposed right midlung pneumonia.  Patient was given IV Vanco and cefepime for the concern of hospital-acquired pneumonia.  [facility] hospitalist consulted for admission.  Review of Systems: As per HPI otherwise negative.    Past Medical History:  Diagnosis Date  . Acute rhinitis 08/10/2017  . Acute stress disorder 10/27/2007   Qualifier: Diagnosis of  By: Arnoldo Morale MD, Balinda Quails   Overview:  Overview:  Qualifier: Diagnosis of  By: Arnoldo Morale MD, Balinda Quails  . Anxiety 07/14/2018  . Arthritis   . Bilateral epiphora 09/25/2015  . Blush 09/23/2007   Qualifier: Diagnosis of  By: Arnoldo Morale MD, Balinda Quails   Overview:  Overview:  Qualifier: Diagnosis of  By: Arnoldo Morale MD, [facility])    basal cell on right temple  . Chest pain in adult 12/16/2016  . Chronic right-sided thoracic back pain 12/16/2016  . Compression fracture of body of thoracic vertebra ([place]) 02/09/2019  . Dysphagia 07/17/2010   Qualifier: Diagnosis of  By: Trellis Paganini PA-c, Amy S   . Eustachian tube dysfunction, bilateral 08/10/2017  . Ganglion of joint 04/22/2007   Qualifier: Diagnosis of  By: Arnoldo Morale MD, Balinda Quails   . Gastroesophageal reflux disease 05/15/2016  . Headache 01/30/2016  . Hemorrhoids   . HEMORRHOIDS, INTERNAL 02/18/2007   Qualifier: Diagnosis of  By: Arnoldo Morale MD, Balinda Quails   . IPF (idiopathic pulmonary fibrosis) ([place]) 07/14/2018   06/22/2018-CT chest with contrast- spectrum of findings suggestive of basilar predominant fibrotic interstitial lung disease with mild  honeycombing asymmetrically involving the right lung with significant progression since February/2019 in September/2019 chest CT.  Findings consistent with UIP.  06/22/2018-echocardiogram- LV ejection fraction 55 to 35%, grade 1 diastolic dysfunction, PA P pressure 4  . Lateral epicondylitis  (tennis elbow)   . Lingular mass 09/07/2015  . Mass of lingula of lung 09/07/2015  . Mass of lower lobe of left lung 09/07/2015  . Mediastinal adenopathy 09/07/2015  . Mediastinal lymphadenopathy 08/27/2015  . Multiple pulmonary nodules 08/27/2015  . Nausea and vomiting 05/15/2016  . Non-small cell cancer of left lung ([place]) 09/24/2015   09/13/2015: Left lower lobe lung biopsy: Overall, the histologic features, in conjunction with the positive staining for cytokeratin 903, cytokeratin 5/6, and p63 support a diagnosis of poorly differentiated squamous cell carcinoma. There is likely sufficient tumor remaining for additional studies, if requested. (JBK:ds 09/17/15)     . Non-small cell lung cancer (NSCLC) ([place]) dx'f 07/30/15  . Nuclear sclerosis of right eye 09/25/2015  . Odynophagia 11/06/2015  . Osteoarthritis 07/17/2010   Qualifier: Diagnosis of  By: Trellis Paganini PA-c, Amy S   . Persistent headaches 02/03/2017  . Pleural effusion on left 08/14/2016  . Pseudophakia, left eye 09/25/2015  . Pulmonary emphysema ([place]) 08/14/2016  . Radiation fibrosis of lung ([place]) 08/14/2016  . Shortness of breath 08/14/2016  . Soft tissue lesion of shoulder region 04/22/2007   Qualifier: Diagnosis of  By: Arnoldo Morale MD, Balinda Quails   Overview:  Overview:  Qualifier: Diagnosis of  By: Arnoldo Morale MD, Balinda Quails  . Spondylosis of cervical joint   . SPRAIN AND STRAIN OF SACROILIAC 09/06/2008   Qualifier: Diagnosis of  By: Arnoldo Morale MD, Balinda Quails     Past Surgical History:  Procedure Laterality Date  . COLONOSCOPY  2003  . KNEE ARTHROSCOPY  2006   right  . RIGHT HEART CATH N/A 10/06/2019   Procedure: RIGHT HEART CATH;  Surgeon: Nelva Bush, MD;  Location: [facility];  Service: Cardiovascular;  Laterality: N/A;     reports that he quit smoking about 8 years ago. His smoking use included cigarettes. He has a 12.40 pack-year smoking history. He has never used smokeless tobacco. He reports current alcohol use of about 7.0 standard drinks of  alcohol per week. He reports that he does not use drugs.  Allergies  Allergen Reactions  . Gentamicin Other (See Comments)    Made eyes red, swollen, hot feeling  . Loteprednol-Tobramycin Other (See Comments)    Made eyes red, swollen, hot feeling    Family History  Problem Relation Age of Onset  . Hypertension Mother   . Heart disease Mother   . Coronary artery disease Other     Prior to Admission medications   Medication Sig Start Date End Date Taking? Authorizing Provider  ALPRAZolam Duanne Moron) 0.25 MG tablet Take 0.25 mg by mouth daily as needed for anxiety.   Yes [provider]  calcium carbonate (TUMS - DOSED IN MG ELEMENTAL CALCIUM) 500 MG chewable tablet Chew 1 tablet by mouth 2 (two) times daily as needed for heartburn.    Yes [provider]  citalopram (CELEXA) 10 MG tablet Take 1 tablet (10 mg total) by mouth daily. 12/21/19  Yes Nafziger, Tommi Rumps, NP  esomeprazole (NEXIUM) 40 MG capsule Take 1 capsule (40 mg total) by mouth 2 (two) times daily before a meal. 12/21/19 03/20/20 Yes Nafziger, Tommi Rumps, NP  glucosamine-chondroitin 500-400 MG tablet Take 1 tablet by mouth daily.    Yes [provider]  metoprolol tartrate (LOPRESSOR) 25 MG tablet Take 0.5 tablets (12.5 mg total) by mouth 2 (two) times daily. 01/12/20  Yes Martyn Ehrich, NP  Multiple Vitamins-Minerals (PRESERVISION AREDS 2) CAPS Take 1 capsule by mouth 2 (two) times daily.   Yes [provider]  Nintedanib (OFEV) 150 MG CAPS Take 150 mg by mouth 2 (two) times daily with a meal.   Yes [provider]  predniSONE (DELTASONE) 10 MG tablet Take 3 tablets (30 mg total) by mouth daily with breakfast. Take 30 mg daily until seen by Dr. Chase Caller on 01/24/20. 01/12/20  Yes Martyn Ehrich, NP  PROAIR HFA 108 (90 Base) MCG/ACT inhaler INHALE 2 PUFFS BY MOUTH INTO THE LUNGS EVERY 6 HOURS AS NEEDED FOR WHEEZING OR SHORTNESS OF BREATH Patient taking differently: Inhale 2 puffs into the  lungs every 6 (six) hours as needed for wheezing or shortness of breath.  08/04/19  Yes Brand Males, MD  rosuvastatin (CRESTOR) 5 MG tablet TAKE 1 TABLET(5 MG) BY MOUTH DAILY Patient taking differently: Take 5 mg by mouth at bedtime.  09/15/19  Yes Nafziger, Tommi Rumps, NP  umeclidinium bromide (INCRUSE ELLIPTA) 62.5 MCG/INH AEPB Inhale 1 puff into the lungs daily. 07/12/19  Yes Brand Males, MD    Physical Exam: Vitals:   02/05/2020 1115 01/12/2020 1130 01/28/2020 1145 02/04/2020 1200  BP: 126/71 124/72 124/77 108/70  Pulse: 88 90 90 92  Resp: (!) 26 (!) 34 (!) 32 (!) 47  Temp:      TempSrc:      SpO2: 96% 97% 97% 95%  Weight:      Height:        Constitutional: NAD, calm, comfortable, on 4 L of oxygen via nasal cannula, alert and communicating well, following commands. Eyes: PERRL, lids and conjunctivae normal ENMT: Mucous membranes are moist. Posterior pharynx clear of any exudate or lesions.Normal dentition.  Neck: normal, supple, no masses, no thyromegaly Respiratory: Tachypneic, bilateral Rales noted on the bases.  No wheezing.   Cardiovascular: Regular rate and rhythm, no murmurs / rubs / gallops. No extremity edema. 2+ pedal pulses. No carotid bruits.  Abdomen: no tenderness, no masses palpated. No hepatosplenomegaly. Bowel sounds positive.  Musculoskeletal: no clubbing / cyanosis. No joint deformity upper and lower extremities. Good ROM, no contractures. Normal muscle tone.  Skin: no rashes, lesions, ulcers. No induration Neurologic: CN 2-12 grossly intact. Sensation intact, DTR normal. Strength 5/5 in all 4.  Psychiatric: Normal judgment and insight. Alert and oriented x 3. Normal mood.    Labs on Admission: I have personally reviewed following labs and imaging studies  CBC: Recent Labs  Lab 01/20/2020 0953  WBC 8.7  NEUTROABS 7.4  HGB 14.1  HCT 44.9  MCV 92.2  PLT 865   Basic Metabolic Panel: Recent Labs  Lab 01/15/2020 0953  NA 139  K 3.9  CL 95*  CO2 31  GLUCOSE  95  BUN 13  CREATININE 0.90  CALCIUM 8.7*   GFR: Estimated Creatinine Clearance: 53.2 mL/min (by C-G formula based on SCr of 0.9 mg/dL). Liver Function Tests: Recent Labs  Lab 01/08/2020 0953  AST 109*  ALT 144*  ALKPHOS 73  BILITOT 0.8  PROT 6.2*  ALBUMIN 2.7*   No results for input(s): LIPASE, AMYLASE in the last 168 hours. No results for input(s): AMMONIA in the last 168 hours. Coagulation Profile: No results for input(s): INR, PROTIME in the last 168 hours. Cardiac Enzymes: No results for input(s): CKTOTAL, CKMB, CKMBINDEX, TROPONINI in the last  168 hours. BNP (last 3 results) Recent Labs    09/19/19 1105  PROBNP 275   HbA1C: No results for input(s): HGBA1C in the last 72 hours. CBG: No results for input(s): GLUCAP in the last 168 hours. Lipid Profile: No results for input(s): CHOL, HDL, LDLCALC, TRIG, CHOLHDL, LDLDIRECT in the last 72 hours. Thyroid Function Tests: No results for input(s): TSH, T4TOTAL, FREET4, T3FREE, THYROIDAB in the last 72 hours. Anemia Panel: No results for input(s): VITAMINB12, FOLATE, FERRITIN, TIBC, IRON, RETICCTPCT in the last 72 hours. Urine analysis:    Component Value Date/Time   COLORURINE RED (A) 12/29/2019 1848   APPEARANCEUR TURBID (A) 12/29/2019 1848   LABSPEC  12/29/2019 1848    TEST NOT REPORTED DUE TO COLOR INTERFERENCE OF URINE PIGMENT   PHURINE  12/29/2019 1848    TEST NOT REPORTED DUE TO COLOR INTERFERENCE OF URINE PIGMENT   GLUCOSEU (A) 12/29/2019 1848    TEST NOT REPORTED DUE TO COLOR INTERFERENCE OF URINE PIGMENT   HGBUR (A) 12/29/2019 1848    TEST NOT REPORTED DUE TO COLOR INTERFERENCE OF URINE PIGMENT   BILIRUBINUR (A) 12/29/2019 1848    TEST NOT REPORTED DUE TO COLOR INTERFERENCE OF URINE PIGMENT   BILIRUBINUR - 05/18/2019 0946   KETONESUR (A) 12/29/2019 1848    TEST NOT REPORTED DUE TO COLOR INTERFERENCE OF URINE PIGMENT   PROTEINUR (A) 12/29/2019 1848    TEST NOT REPORTED DUE TO COLOR INTERFERENCE OF URINE  PIGMENT   UROBILINOGEN 0.2 05/18/2019 0946   NITRITE (A) 12/29/2019 1848    TEST NOT REPORTED DUE TO COLOR INTERFERENCE OF URINE PIGMENT   LEUKOCYTESUR (A) 12/29/2019 1848    TEST NOT REPORTED DUE TO COLOR INTERFERENCE OF URINE PIGMENT    Radiological Exams on Admission: DG Chest Port 1 View  Result Date: 02/03/2020 CLINICAL DATA:  Shortness of breath. History of lung cancer and pulmonary fibrosis. EXAM: PORTABLE CHEST 1 VIEW COMPARISON:  Chest x-ray 12/28/2019 FINDINGS: The cardiac silhouette, mediastinal and hilar contours are stable. Persistent severe chronic lung disease with pulmonary fibrosis and pleural effusions. Increased density in the right mid lung suspicious for superimposed pneumonia. IMPRESSION: Chronic lung disease with suspected superimposed right mid lung pneumonia. Electronically Signed   By: Marijo Sanes M.D.   On: 01/22/2020 10:11    EKG: Independently reviewed.  Sinus rhythm, biatrial enlargement, no ST elevation or depression noted.  Assessment/Plan Principal Problem:   Right middle lobe pneumonia Active Problems:   Gastroesophageal reflux disease   IPF (idiopathic pulmonary fibrosis) (HCC)   HTN (hypertension)   HLD (hyperlipidemia)   Depression with anxiety   Chronic hypoxemic respiratory failure (HCC)   Elevated liver enzymes   Hospital-acquired pneumonia: Patient recently hospitalized and discharged on 7/28.  Presented with worsening shortness of breath.  Chest x-ray shows new right middle lobe opacity.  He is tachypneic, afebrile with no leukocytosis.  Currently on 4 L of oxygen which is his baseline.  COVID-19 is negative. -He started on IV Vanco and cefepime in ED. -Admit patient to stepdown unit for close monitoring.  On continuous pulse ox. -Continue IV Vanco and cefepime. -Check blood culture, procalcitonin level, lactic acid, magnesium, urine strep antigen, urine Legionella antigen -We will start patient on Solu-Medrol. -Monitor vitals  closely.  IPF: -Continue Ofev twice daily, -His prednisone recently increased from 15 mg to 30 mg-hold for now.  Start patient on IV Solu-Medrol. -Continue home inhalers -Followed by [facility] outpatient.  Elevated liver enzymes: AST: 109, ALT: 144 -We  will check PT/INR, acute hepatitis panel -We will get right upper quadrant ultrasound.  Sinus Tachycardia: Stable.  Recently started on metoprolol by PCCM.  Continue metoprolol 12.5 mg twice daily  Hyperlipidemia: Continue statin  GERD: Continue Tums/PPI  Depression/anxiety: Continue Celexa and Xanax as needed  DVT prophylaxis: Lovenox/SCD Code Status: Full code-confirmed with the patient and his wife Family Communication: Patient's wife present at bedside.  Plan of care discussed with patient in length and he verbalized understanding and agreed with it. Disposition Plan: Home in 1 to 2 days Consults called: None Admission status: Inpatient   Mckinley Jewel MD [facility]  If 7PM-7AM, please contact night-coverage [email or web address] Password [facility]  01/10/2020, 12:31 PM

## 2020-01-16 NOTE — ED Provider Notes (Signed)
[facility] EMERGENCY DEPARTMENT Provider Note   CSN: [id number] Arrival date & time: 01/29/2020  8032     History Chief Complaint  Patient presents with  . Respiratory Distress    Jason Summers is a 86 y.o. male with a history of chronic respiratory failure on 4L via [place] @ baseline, idiopathic pulmonary fibrosis, emphysema, non-small cell lung cancer, hypercholesterolemia, & pulmonary hypertension who presents to the ED via EMS with complaints of progressively worsening dyspnea since hospital discharge 01/04/20. Admitted to the hospital for acute hypoxic respiratory failure, suspected to be secondary to IPF exacerbation & pneumonia, received CAP coverage abx, discharged home on 2 days of BID doxycycline & on 4L via [place].  Patient states that since leaving the hospital he feels like his breathing has progressively worsened.  He feels short of breath at all times, worse with any activity even on his 4 L of oxygen via nasal cannula.  No other alleviating or aggravating factors.  Has had associated cough that is intermittently productive of phlegm sputum.  Denies fever, chills, chest pain, hemoptysis, leg pain/swelling, vomiting, or diarrhea.  Per EMS patient's respiratory rate was in the 40s and his SPO2 was 88% on his baseline 4 L via nasal cannula, they applied a 10 L nonrebreather with some improvement.  Per pulmonology office visit note:  Recently admitted from 12/28/19-01/04/20 for sepsis and acute hypoxic respiratory failure secondary to IPF exacerbation and suspected pneumonia. On admission patient met sepsis criteria (leukocytosis 13.7, hypoxia, tachycardia, tachypnea). Received IVF, IV antibiotics. O2 demand improved from 15L to 4L. CTA was negative for PE; ground glass appearance questionable infiltrate. Troponin 34 likely from demand ischemia, irregular rhythm on telemetry concerning for aib without known history. Lopressor as needed for HR > 110. EKG revealed sinus tachycardia with  PVCs.He is on OFEV which is currently on hold pending PCCM recommendations.  Patient discharged with doxycycline BID for additional two days and was on 4L oxygen satting 92%.    HPI     Past Medical History:  Diagnosis Date  . Acute rhinitis 08/10/2017  . Acute stress disorder 10/27/2007   Qualifier: Diagnosis of  By: Arnoldo Morale MD, Balinda Quails   Overview:  Overview:  Qualifier: Diagnosis of  By: Arnoldo Morale MD, Balinda Quails  . Anxiety 07/14/2018  . Arthritis   . Bilateral epiphora 09/25/2015  . Blush 09/23/2007   Qualifier: Diagnosis of  By: Arnoldo Morale MD, Balinda Quails   Overview:  Overview:  Qualifier: Diagnosis of  By: Arnoldo Morale MD, [facility])    basal cell on right temple  . Chest pain in adult 12/16/2016  . Chronic right-sided thoracic back pain 12/16/2016  . Compression fracture of body of thoracic vertebra ([place]) 02/09/2019  . Dysphagia 07/17/2010   Qualifier: Diagnosis of  By: Trellis Paganini PA-c, Amy S   . Eustachian tube dysfunction, bilateral 08/10/2017  . Ganglion of joint 04/22/2007   Qualifier: Diagnosis of  By: Arnoldo Morale MD, Balinda Quails   . Gastroesophageal reflux disease 05/15/2016  . Headache 01/30/2016  . Hemorrhoids   . HEMORRHOIDS, INTERNAL 02/18/2007   Qualifier: Diagnosis of  By: Arnoldo Morale MD, Balinda Quails   . IPF (idiopathic pulmonary fibrosis) ([place]) 07/14/2018   06/22/2018-CT chest with contrast- spectrum of findings suggestive of basilar predominant fibrotic interstitial lung disease with mild honeycombing asymmetrically involving the right lung with significant progression since February/2019 in September/2019 chest CT.  Findings consistent with UIP.  06/22/2018-echocardiogram- LV ejection fraction 55 to 12%, grade 1 diastolic  dysfunction, PA P pressure 4  . Lateral epicondylitis (tennis elbow)   . Lingular mass 09/07/2015  . Mass of lingula of lung 09/07/2015  . Mass of lower lobe of left lung 09/07/2015  . Mediastinal adenopathy 09/07/2015  . Mediastinal lymphadenopathy 08/27/2015  . Multiple pulmonary nodules  08/27/2015  . Nausea and vomiting 05/15/2016  . Non-small cell cancer of left lung ([place]) 09/24/2015   09/13/2015: Left lower lobe lung biopsy: Overall, the histologic features, in conjunction with the positive staining for cytokeratin 903, cytokeratin 5/6, and p63 support a diagnosis of poorly differentiated squamous cell carcinoma. There is likely sufficient tumor remaining for additional studies, if requested. (JBK:ds 09/17/15)     . Non-small cell lung cancer (NSCLC) ([place]) dx'f 07/30/15  . Nuclear sclerosis of right eye 09/25/2015  . Odynophagia 11/06/2015  . Osteoarthritis 07/17/2010   Qualifier: Diagnosis of  By: Trellis Paganini PA-c, Amy S   . Persistent headaches 02/03/2017  . Pleural effusion on left 08/14/2016  . Pseudophakia, left eye 09/25/2015  . Pulmonary emphysema ([place]) 08/14/2016  . Radiation fibrosis of lung ([place]) 08/14/2016  . Shortness of breath 08/14/2016  . Soft tissue lesion of shoulder region 04/22/2007   Qualifier: Diagnosis of  By: Arnoldo Morale MD, Balinda Quails   Overview:  Overview:  Qualifier: Diagnosis of  By: Arnoldo Morale MD, Balinda Quails  . Spondylosis of cervical joint   . SPRAIN AND STRAIN OF SACROILIAC 09/06/2008   Qualifier: Diagnosis of  By: Arnoldo Morale MD, Balinda Quails     Patient Active Problem List   Diagnosis Date Noted  . Sepsis ([place]) 12/30/2019  . Pulmonary hypertension, unspecified ([place])   . Coordination of complex care 09/19/2019  . Compression fracture of body of thoracic vertebra ([place]) 02/09/2019  . IPF (idiopathic pulmonary fibrosis) ([place]) 07/14/2018  . Anxiety 07/14/2018  . Acute rhinitis 08/10/2017  . Eustachian tube dysfunction, bilateral 08/10/2017  . Persistent headaches 02/03/2017  . Chest pain in adult 12/16/2016  . Chronic right-sided thoracic back pain 12/16/2016  . Pleural effusion on left 08/14/2016  . Pulmonary emphysema ([place]) 08/14/2016  . Radiation fibrosis of lung ([place]) 08/14/2016  . Shortness of breath 08/14/2016  . History of esophageal stricture 05/15/2016  .  Gastroesophageal reflux disease 05/15/2016  . Nausea and vomiting 05/15/2016  . Headache 01/30/2016  . Odynophagia 11/06/2015  . Encounter for antineoplastic chemotherapy 10/15/2015  . Bilateral epiphora 09/25/2015  . Nuclear sclerosis of right eye 09/25/2015  . Pseudophakia, left eye 09/25/2015  . Squamous blepharitis of upper eyelids of both eyes 09/25/2015  . Non-small cell cancer of left lung ([place]) 09/24/2015  . Mediastinal adenopathy 09/07/2015  . Mass of lingula of lung 09/07/2015  . Multiple pulmonary nodules 08/27/2015  . Mediastinal lymphadenopathy 08/27/2015  . Osteoarthritis 07/17/2010  . Dysphagia 07/17/2010  . DYSPHAGIA, PHARYNGOESOPHAGEAL PHASE 05/07/2010  . HYPERCHOLESTEROLEMIA, MILD 09/06/2008  . SPRAIN AND STRAIN OF SACROILIAC 09/06/2008  . Acute stress disorder 10/27/2007  . Blush 09/23/2007  . Soft tissue lesion of shoulder region 04/22/2007  . GANGLION OF JOINT 04/22/2007  . HEMORRHOIDS, INTERNAL 02/18/2007    Past Surgical History:  Procedure Laterality Date  . COLONOSCOPY  2003  . KNEE ARTHROSCOPY  2006   right  . RIGHT HEART CATH N/A 10/06/2019   Procedure: RIGHT HEART CATH;  Surgeon: Nelva Bush, MD;  Location: [facility];  Service: Cardiovascular;  Laterality: N/A;       Family History  Problem Relation Age of Onset  . Hypertension Mother   .  Heart disease Mother   . Coronary artery disease Other     Social History   Tobacco Use  . Smoking status: Former Smoker    Packs/day: 0.20    Years: 62.00    Pack years: 12.40    Types: Cigarettes    Quit date: 03/10/2011    Years since quitting: 8.8  . Smokeless tobacco: Never Used  Vaping Use  . Vaping Use: Never used  Substance Use Topics  . Alcohol use: Yes    Alcohol/week: 7.0 standard drinks    Types: 7 Glasses of wine per week    Comment: 1-2 glasses of wine a day  . Drug use: No    Home Medications Prior to Admission medications   Medication Sig Start Date End Date  Taking? Authorizing Provider  calcium carbonate (TUMS - DOSED IN MG ELEMENTAL CALCIUM) 500 MG chewable tablet Chew 1 tablet by mouth 2 (two) times daily as needed for heartburn.     [provider]  citalopram (CELEXA) 10 MG tablet Take 1 tablet (10 mg total) by mouth daily. 12/21/19   Nafziger, Tommi Rumps, NP  esomeprazole (NEXIUM) 40 MG capsule Take 1 capsule (40 mg total) by mouth 2 (two) times daily before a meal. 12/21/19 03/20/20  Nafziger, Tommi Rumps, NP  glucosamine-chondroitin 500-400 MG tablet Take 1 tablet by mouth daily.     [provider]  metoprolol tartrate (LOPRESSOR) 25 MG tablet Take 0.5 tablets (12.5 mg total) by mouth 2 (two) times daily. 01/12/20   Martyn Ehrich, NP  predniSONE (DELTASONE) 10 MG tablet Take 3 tablets (30 mg total) by mouth daily with breakfast. Take 30 mg daily until seen by Dr. Chase Caller on 01/24/20. 01/12/20   Martyn Ehrich, NP  PROAIR HFA 108 (90 Base) MCG/ACT inhaler INHALE 2 PUFFS BY MOUTH INTO THE LUNGS EVERY 6 HOURS AS NEEDED FOR WHEEZING OR SHORTNESS OF BREATH Patient taking differently: Inhale 2 puffs into the lungs every 6 (six) hours as needed for wheezing or shortness of breath.  08/04/19   Brand Males, MD  rosuvastatin (CRESTOR) 5 MG tablet TAKE 1 TABLET(5 MG) BY MOUTH DAILY Patient taking differently: Take 5 mg by mouth daily.  09/15/19   Nafziger, Tommi Rumps, NP  umeclidinium bromide (INCRUSE ELLIPTA) 62.5 MCG/INH AEPB Inhale 1 puff into the lungs daily. 07/12/19   Brand Males, MD    Allergies    Gentamicin and Loteprednol-tobramycin  Review of Systems   Review of Systems  Constitutional: Negative for chills and fever.  Respiratory: Positive for cough and shortness of breath.   Cardiovascular: Negative for chest pain and leg swelling.  Gastrointestinal: Negative for abdominal pain, diarrhea, nausea and vomiting.  Skin: Negative for rash.  Neurological: Negative for syncope.  All other systems reviewed and are  negative.   Physical Exam Updated Vital Signs BP 110/69 (BP Location: Right Arm)   Pulse 84   Temp (!) 97.5 F (36.4 C) (Oral)   Resp (!) 43   Ht _0  (1.676 m)   Wt 72.6 kg   SpO2 95%   BMI 25.83 kg/m   Physical Exam Vitals and nursing note reviewed.  Constitutional:      Appearance: He is well-developed.  HENT:     Head: Normocephalic and atraumatic.  Eyes:     General:        Right eye: No discharge.        Left eye: No discharge.     Conjunctiva/sclera: Conjunctivae normal.  Cardiovascular:  Rate and Rhythm: Normal rate and regular rhythm.  Pulmonary:     Effort: Tachypnea present.     Breath sounds: Examination of the right-middle field reveals rhonchi and rales. Examination of the right-lower field reveals rhonchi and rales. Examination of the left-lower field reveals rhonchi and rales. Rhonchi and rales present.     Comments: No obvious wheezing.  Abdominal:     General: There is no distension.     Palpations: Abdomen is soft.     Tenderness: There is no abdominal tenderness.  Musculoskeletal:     Cervical back: Neck supple. No rigidity.     Right lower leg: No edema.     Left lower leg: No edema.  Skin:    General: Skin is warm and dry.     Findings: No rash.  Neurological:     Mental Status: He is alert.     Comments: Clear speech.   Psychiatric:        Mood and Affect: Mood is anxious.    ED Results / Procedures / Treatments   Labs (all labs ordered are listed, but only abnormal results are displayed) Labs Reviewed - No data to display  EKG EKG Interpretation  Date/Time:  Monday January 16 2020 09:44:15 EDT Ventricular Rate:  85 PR Interval:    QRS Duration: 93 QT Interval:  373 QTC Calculation: 444 R Axis:   66 Text Interpretation: Sinus rhythm Biatrial enlargement Nonspecific t wave abnormality No significant change since 12/28/2019 Confirmed by Veryl Speak [phone]) on 01/25/2020 9:48:07 AM   Radiology DG Chest Port 1 View  Result  Date: 01/15/2020 CLINICAL DATA:  Shortness of breath. History of lung cancer and pulmonary fibrosis. EXAM: PORTABLE CHEST 1 VIEW COMPARISON:  Chest x-ray 12/28/2019 FINDINGS: The cardiac silhouette, mediastinal and hilar contours are stable. Persistent severe chronic lung disease with pulmonary fibrosis and pleural effusions. Increased density in the right mid lung suspicious for superimposed pneumonia. IMPRESSION: Chronic lung disease with suspected superimposed right mid lung pneumonia. Electronically Signed   By: Marijo Sanes M.D.   On: 01/09/2020 10:11    Procedures Procedures (including critical care time)  Medications Ordered in ED Medications - No data to display  ED Course  I have reviewed the triage vital signs and the nursing notes.  Pertinent labs & imaging results that were available during my care of the patient were reviewed by me and considered in my medical decision making (see chart for details).    Jason Summers was evaluated in Emergency Department on 01/21/2020 for the symptoms described in the history of present illness. He/she was evaluated in the context of the global COVID-19 pandemic, which necessitated consideration that the patient might be at risk for infection with the SARS-CoV-2 virus that causes COVID-19. Institutional protocols and algorithms that pertain to the evaluation of patients at risk for COVID-19 are in a state of rapid change based on information released by regulatory bodies including the CDC and federal and state organizations. These policies and algorithms were followed during the patient's care in the ED.  MDM Rules/Calculators/A&P                          Patient presents to the ED with worsening dyspnea since most recent hospitalization at the end of July.  On arrival patient is tachypneic, requiring 10L NRB to maintain SpO2 91-95% on my assessment, vitals otherwise unremarkable.  Patient has rales and rhonchi at the bases which extend to  the right  midlung.  No notable peripheral edema present.  Differential diagnosis includes: Pneumonia, IPF exacerbation, CHF, pleural effusion, pneumothorax, critical anemia, pulmonary embolism.   We will trial a puffs of albuterol and give some Ativan for anxiety.  Additional history obtained:  Additional history obtained from EMS and chart review. Previous records obtained and reviewed.  EKG: No significant change from prior.  Imaging Studies ordered:  I ordered imaging studies which included CXR, I independently visualized and interpreted imaging & agree with radiologist impression- Chronic lung disease with suspected superimposed right mid lung pneumonia.  New pneumonia since discharge from the hospital, will start healthcare associated pneumonia coverage with cefepime and vancomycin.  Anticipate admission.  Lab Tests:  I Ordered, reviewed, and interpreted labs, which included:  CBC: Unremarkable CMP: Mild transaminitis, normal total bilirubin, no abdominal tenderness palpation.  11:10: RE-EVAL: Patient feeling a bit better, his tachypnea has improved, RR in the upper 20s on my exam, has been transitioned to his 4L via [place] with good maintaining of his SpO2. Will discuss w/ hospitalist service for admission.  Discussed results and plan of care with the patient and his wife at bedside, they are in agreement.  12:26: CONSULT: Discussed with hospitalist Dr. Deretha Emory- accepts admission, requesting ABG which has been ordered.   Findings and plan of care discussed with supervising physician Dr. Stark Jock who has evaluated patient as shared visit & is in agreement.   Portions of this note were generated with Lobbyist. Dictation errors may occur despite best attempts at proofreading.  Final Clinical Impression(s) / ED Diagnoses Final diagnoses:  HCAP (healthcare-associated pneumonia)    Rx / DC Orders ED Discharge Orders    None       Amaryllis Dyke, PA-C 02/07/2020 1252     Veryl Speak, MD 01/17/20 [phone]

## 2020-01-16 NOTE — Telephone Encounter (Signed)
Spoke with pt's wife, Mickel Baas. Apologized for not calling back in Friday as we lost power here in the office. Advised her that if the pt is still having issues with his breathing and increased heart rate, he should present to the ED or urgent care of be evaluated. Mickel Baas agreed. Nothing further was needed.

## 2020-01-16 NOTE — ED Notes (Signed)
Sharyn Lull, RN on [facility] stated to perform bedside report at this time.

## 2020-01-16 NOTE — ED Triage Notes (Signed)
Pt presents to ED from home with respiratory distress. Pt was recently discharged from hospital after being treated for PNA. Per EMS pt spo2 was 88% on home [place] at 4lpm with RR ~ 40-50. Placed on NRB and spo2 increased to 98%. Hx pulmonary fibrosis   20G LW HR 87 BP 115/75

## 2020-01-16 NOTE — ED Notes (Signed)
Dr. Doristine Bosworth at bedside

## 2020-01-16 NOTE — Progress Notes (Signed)
Pt arrived from ED.  Noted difficulty breathing upon arrival.  Saturation noted 66%- non-rebreather applied 94 % RT notified 118/76

## 2020-01-16 NOTE — Progress Notes (Addendum)
Pharmacy Antibiotic Note  Jason Summers is a 86 y.o. male admitted on 01/25/2020 with pneumonia.  Pharmacy has been consulted for vancomycin dosing. Pt is afebrile and WBC is WNL. Scr is WNL.   Plan: Vancomycin 1523m IV x 1 then 7539mIV Q12H F/u renal fxn, C&S, clinical [facility] continuation of gram neg coverage  Addendum: Continuing cefepime 2gm IV Q12H  Height: _0  (167.6 cm) Weight: 72.6 kg (160 lb 0.9 oz) IBW/kg (Calculated) : 63.8  Temp (24hrs), Avg:97.5 F (36.4 C), Min:97.5 F (36.4 C), Max:97.5 F (36.4 C)  Recent Labs  Lab 02/06/2020 0953  WBC 8.7  CREATININE 0.90    Estimated Creatinine Clearance: 53.2 mL/min (by C-G formula based on SCr of 0.9 mg/dL).    Allergies  Allergen Reactions  . Gentamicin Other (See Comments)    Made eyes red, swollen, hot feeling  . Loteprednol-Tobramycin Other (See Comments)    Other reaction(s): Other (See Comments) Made eyes Red, Swollen, Red, Warm feeling  Other reaction(s): Other (See Comments) Made eyes Red, Swollen, Red, Warm feeling  Made eyes Red, Swollen, Red, Warm feeling     Antimicrobials this admission: Vanc 8/9>> Cefepime x 1 8/9  Dose adjustments this admission: N/A  Microbiology results: Pending  Thank you for allowing pharmacy to be a part of this patient's care.  Salbador Fiveash, RaRande Lawman/02/2020 10:17 AM

## 2020-01-17 DIAGNOSIS — J9621 Acute and chronic respiratory failure with hypoxia: Secondary | ICD-10-CM

## 2020-01-17 DIAGNOSIS — J84112 Idiopathic pulmonary fibrosis: Secondary | ICD-10-CM | POA: Diagnosis not present

## 2020-01-17 DIAGNOSIS — J189 Pneumonia, unspecified organism: Secondary | ICD-10-CM | POA: Diagnosis not present

## 2020-01-17 LAB — COMPREHENSIVE METABOLIC PANEL
ALT: 116 U/L — ABNORMAL HIGH (ref 0–44)
AST: 62 U/L — ABNORMAL HIGH (ref 15–41)
Albumin: 2.4 g/dL — ABNORMAL LOW (ref 3.5–5.0)
Alkaline Phosphatase: 73 U/L (ref 38–126)
Anion gap: 10 (ref 5–15)
BUN: 20 mg/dL (ref 8–23)
CO2: 29 mmol/L (ref 22–32)
Calcium: 8.3 mg/dL — ABNORMAL LOW (ref 8.9–10.3)
Chloride: 97 mmol/L — ABNORMAL LOW (ref 98–111)
Creatinine, Ser: 1.17 mg/dL (ref 0.61–1.24)
GFR calc Af Amer: >60 mL/min (ref >60)
GFR calc non Af Amer: 56 mL/min — ABNORMAL LOW (ref >60)
Glucose, Bld: 144 mg/dL — ABNORMAL HIGH (ref 70–99)
Potassium: 4.8 mmol/L (ref 3.5–5.1)
Sodium: 136 mmol/L (ref 135–145)
Total Bilirubin: 1.1 mg/dL (ref 0.3–1.2)
Total Protein: 5.6 g/dL — ABNORMAL LOW (ref 6.5–8.1)

## 2020-01-17 LAB — CBC
HCT: 40.3 % (ref 39.0–52.0)
Hemoglobin: 12.9 g/dL — ABNORMAL LOW (ref 13.0–17.0)
MCH: 29.3 pg (ref 26.0–34.0)
MCHC: 32 g/dL (ref 30.0–36.0)
MCV: 91.6 fL (ref 80.0–100.0)
Platelets: 171 10*3/uL (ref 150–400)
RBC: 4.4 MIL/uL (ref 4.22–5.81)
RDW: 13.7 % (ref 11.5–15.5)
WBC: 5.9 10*3/uL (ref 4.0–10.5)
nRBC: 0 % (ref 0.0–0.2)

## 2020-01-17 LAB — LEGIONELLA PNEUMOPHILA SEROGP 1 UR AG: L. pneumophila Serogp 1 Ur Ag: NEGATIVE

## 2020-01-17 MED ORDER — LEVALBUTEROL HCL 0.63 MG/3ML IN NEBU
0.6300 mg | INHALATION_SOLUTION | RESPIRATORY_TRACT | Status: DC | PRN
  Administered 2020-01-19 – 2020-01-21 (×2): 0.63 mg via RESPIRATORY_TRACT
  Filled 2020-01-17 (×3): qty 3

## 2020-01-17 NOTE — Consult Note (Signed)
NAME:  Jason Summers, MRN:  [id number], DOB:  [birth year], LOS: 1 ADMISSION DATE:  01/12/2020, CONSULTATION DATE:  01/17/20 REFERRING MD:  Doristine Bosworth  CHIEF COMPLAINT:  Dyspnea   Brief History   Jason Summers is a 86 y.o. male who was admitted 8/9 with acute on chronic hypoxic respiratory failure 2/2 IPF flare and possible HCAP.  History of present illness   Jason Summers is a 86 y.o. male who has a PMH including but not limited to IPF on steroids and OFEV and followed by Dr. Chase Caller, remote lung CA s/p chemo and XRT now in [place] (see "past medical history" for rest).  He presented to [facility] ED 8/9 with worsening dyspnea and generalized fatigue for several days.  He had recent admission 12/28/19 through 01/04/20 for acute on chronic hypoxic respiratory failure 2/2 IPF flare and CAP.  He was discharged and had follow up scheduled with Dr. Chase Caller on 01/24/20.  Wife states since discharge, he had progressively worsened to the point of extreme dyspnea mainly with any type of exertion.  She got worried when his sats were in the 60's despite baseline 4L O2 and HR in 120's; therefore, called EMS.  He has had ongoing chronic cough without change in characteristics of phlegm.  Denies any recent fevers/chills/sweats, headaches, N/V, abd pain.  Has had mild diarrhea x 2 days.  He was admitted with concern for HCAP and was started on vanc / cefepime.  On 8/10, he required HFNC at 10L; therefore, PCCM asked to see in consultation.  Past Medical History  has HYPERCHOLESTEROLEMIA, MILD; Acute stress disorder; HEMORRHOIDS, INTERNAL; Osteoarthritis; Soft tissue lesion of shoulder region; GANGLION OF JOINT; Blush; DYSPHAGIA, PHARYNGOESOPHAGEAL PHASE; SPRAIN AND STRAIN OF SACROILIAC; Dysphagia; Multiple pulmonary nodules; Mediastinal lymphadenopathy; Mediastinal adenopathy; Mass of lingula of lung; Non-small cell cancer of left lung ([place]); Encounter for antineoplastic chemotherapy; Odynophagia; Headache; History of esophageal  stricture; Gastroesophageal reflux disease; Nausea and vomiting; Pleural effusion on left; Pulmonary emphysema ([place]); Radiation fibrosis of lung ([place]); Shortness of breath; Chest pain in adult; Chronic right-sided thoracic back pain; Persistent headaches; IPF (idiopathic pulmonary fibrosis) ([place]); Anxiety; Acute rhinitis; Bilateral epiphora; Eustachian tube dysfunction, bilateral; Nuclear sclerosis of right eye; Pseudophakia, left eye; 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  Significant Hospital Events   8/9 > admit.  Consults:  PCCM.  Procedures:  None.  Significant Diagnostic Tests:  CXR 8/9 > RML opacity.  Micro Data:  Blood 8/9 >  COVID 8/9 > neg.  Antimicrobials:  Vanc 8/9 >  Cefepime 8/9 >    Interim history/subjective:  Comfortable.  Was on 10L HFNC with 100% SpO2.  I turned this down to 5L and he maintained sats of 97 - 98%.  Objective:  Blood pressure 114/75, pulse 65, temperature 97.9 F (36.6 C), temperature source Axillary, resp. rate 17, height _0  (1.676 m), weight 72.6 kg, SpO2 99 %.        Intake/Output Summary (Last 24 hours) at 01/17/2020 1129 Last data filed at 01/17/2020 0500 Gross per 24 hour  Intake 250 ml  Output 275 ml  Net -25 ml   Filed Weights   01/26/2020 0947 01/17/20 0323  Weight: 72.6 kg 72.6 kg    Examination: General: Elderly male, chronically ill  appearing, in NAD. Neuro: A&O x 3, no deficits. HEENT: [place]/AT. Sclerae anicteric.  EOMI. Cardiovascular: RRR, no M/R/G.  Lungs: Respirations even and unlabored.  Faint crackles bilaterally. Abdomen: BS x 4, soft, NT/ND.  Musculoskeletal: No gross  deformities, no edema.  Skin: Intact, warm, no rashes.  Assessment & Plan:   Acute on chronic hypoxic respiratory failure - multifactorial, likely combination of IPF flare, possible HCAP, generalized deconditioning. - Continue supplemental O2 as needed to maintain SpO2 > 88% (I decreased from 10L HFNC to 5L HFNC and have asked nursing staff to continue weaning as we are not aiming for a goal of 100% SpO2). - Continue emipiric abx and follow cultures. - Continue steroids and transition back to oral pred, likely 42m daily until follow up with Dr. RChase Callernext week (baseline is 368mdaily). - Continue OFEV, BD's (changed albuterol to levalbuterol given that he has had tachycardia recently). - Encouraged pt and wife to think about goals of care as I feel that in the event of decline, intubation / ACLS would not offer any benefit here and would likely cause more pain and suffering.  Generalized deconditioning. - Needs aggressive PT.   Wife updated at bedside.  Labs   CBC: Recent Labs  Lab 01/23/2020 0953 02/07/2020 1248 01/23/2020 1312 01/17/20 0125  WBC 8.7  --  7.8 5.9  NEUTROABS 7.4  --   --   --   HGB 14.1 13.3 13.4 12.9*  HCT 44.9 39.0 42.4 40.3  MCV 92.2  --  92.4 91.6  PLT 179  --  168 17160 Basic Metabolic Panel: Recent Labs  Lab 02/07/2020 0953 01/09/2020 1248 01/31/2020 1312 01/17/20 0125  NA 139 136  --  136  K 3.9 3.7  --  4.8  CL 95*  --   --  97*  CO2 31  --   --  29  GLUCOSE 95  --   --  144*  BUN 13  --   --  20  CREATININE 0.90  --  0.89 1.17  CALCIUM 8.7*  --   --  8.3*  MG  --   --  2.1  --   PHOS  --   --  2.3*  --    GFR: Estimated Creatinine Clearance: 40.9 mL/min (by C-G formula based on SCr of 1.17 mg/dL). Recent Labs  Lab 01/22/2020 0953 01/26/2020 1305 01/25/2020 1312 01/09/2020 1748 01/17/20 0125  PROCALCITON  --   --  <0.10  --   --   WBC 8.7  --  7.8  --  5.9  LATICACIDVEN  --  1.3  --  1.9  --    Liver Function Tests: Recent Labs  Lab  02/01/2020 0953 01/17/20 0125  AST 109* 62*  ALT 144* 116*  ALKPHOS 73 73  BILITOT 0.8 1.1  PROT 6.2* 5.6*  ALBUMIN 2.7* 2.4*   No results for input(s): LIPASE, AMYLASE in the last 168 hours. No results for input(s): AMMONIA in the last 168 hours. ABG    Component Value Date/Time   PHART 7.467 (H) 01/24/2020 1248   PCO2ART 44.5 01/14/2020 1248   PO2ART 73 (L) 01/19/2020 1248   HCO3 32.2 (H) 01/10/2020 1248   TCO2 34 (H) 01/17/2020 1248   O2SAT 95.0 02/01/2020 1248    Coagulation Profile: Recent Labs  Lab 01/19/2020 1312  INR 1.1   Cardiac Enzymes: No results for input(s): CKTOTAL, CKMB, CKMBINDEX, TROPONINI in the last 168 hours. HbA1C: No results found for:  HGBA1C CBG: No results for input(s): GLUCAP in the last 168 hours.  Review of Systems:   All negative; except for those that are bolded, which indicate positives.  Constitutional: weight loss, weight gain, night sweats, fevers, chills, fatigue, weakness.  HEENT: headaches, sore throat, sneezing, nasal congestion, post nasal drip, difficulty swallowing, tooth/dental problems, visual complaints, visual changes, ear aches. Neuro: difficulty with speech, weakness, numbness, ataxia. CV:  chest pain, orthopnea, PND, swelling in lower extremities, dizziness, palpitations, syncope.  Resp: cough, hemoptysis, dyspnea, wheezing. GI: heartburn, indigestion, abdominal pain, nausea, vomiting, diarrhea, constipation, change in bowel habits, loss of appetite, hematemesis, melena, hematochezia.  GU: dysuria, change in color of urine, urgency or frequency, flank pain, hematuria. MSK: joint pain or swelling, decreased range of motion. Psych: change in mood or affect, depression, anxiety, suicidal ideations, homicidal ideations. Skin: rash, itching, bruising.   Past medical history  He,  has a past medical history of Acute rhinitis (08/10/2017), Acute stress disorder (10/27/2007), Anxiety (07/14/2018), Arthritis, Bilateral epiphora  (09/25/2015), Blush (09/23/2007), Cancer ([place]), Chest pain in adult (12/16/2016), Chronic right-sided thoracic back pain (12/16/2016), Compression fracture of body of thoracic vertebra ([place]) (02/09/2019), Dysphagia (07/17/2010), Eustachian tube dysfunction, bilateral (08/10/2017), Ganglion of joint (04/22/2007), Gastroesophageal reflux disease (05/15/2016), Headache (01/30/2016), Hemorrhoids, HEMORRHOIDS, INTERNAL (02/18/2007), IPF (idiopathic pulmonary fibrosis) ([place]) (07/14/2018), Lateral epicondylitis (tennis elbow), Lingular mass (09/07/2015), Mass of lingula of lung (09/07/2015), Mass of lower lobe of left lung (09/07/2015), Mediastinal adenopathy (09/07/2015), Mediastinal lymphadenopathy (08/27/2015), Multiple pulmonary nodules (08/27/2015), Nausea and vomiting (05/15/2016), Non-small cell cancer of left lung ([place]) (09/24/2015), Non-small cell lung cancer (NSCLC) ([place]) (dx'f 07/30/15), Nuclear sclerosis of right eye (09/25/2015), Odynophagia (11/06/2015), Osteoarthritis (07/17/2010), Persistent headaches (02/03/2017), Pleural effusion on left (08/14/2016), Pseudophakia, left eye (09/25/2015), Pulmonary emphysema ([place]) (08/14/2016), 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   Surgical History    Past Surgical History:  Procedure Laterality Date  . COLONOSCOPY  2003  . KNEE ARTHROSCOPY  2006   right  . RIGHT HEART CATH N/A 10/06/2019   Procedure: RIGHT HEART CATH;  Surgeon: Nelva Bush, MD;  Location: [facility];  Service: Cardiovascular;  Laterality: N/A;     Social History   reports that he quit smoking about 8 years ago. His smoking use included cigarettes. He has a 12.40 pack-year smoking history. He has never used smokeless tobacco. He reports current alcohol use of about 7.0 standard drinks of alcohol per week. He reports that he does not use drugs.    Family history   His family history includes Coronary artery disease in an other family member; Heart disease in his mother; Hypertension in his mother.   Allergies Allergies  Allergen Reactions  . Gentamicin Other (See Comments)    Made eyes red, swollen, hot feeling  . Loteprednol-Tobramycin Other (See Comments)    Made eyes red, swollen, hot feeling     Home meds  Prior to Admission medications   Medication Sig Start Date End Date Taking? Authorizing Provider  ALPRAZolam Duanne Moron) 0.25 MG tablet Take 0.25 mg by mouth daily as needed for anxiety.   Yes [provider]  calcium carbonate (TUMS - DOSED IN MG ELEMENTAL CALCIUM) 500 MG chewable tablet Chew 1 tablet by mouth 2 (two) times daily as needed for heartburn.    Yes [provider]  citalopram (CELEXA) 10 MG tablet Take 1 tablet (10 mg total)  by mouth daily. 12/21/19  Yes Nafziger, Tommi Rumps, NP  esomeprazole (NEXIUM) 40 MG capsule Take 1 capsule (40 mg total) by mouth 2 (two) times daily before a meal. 12/21/19 03/20/20 Yes Nafziger, Tommi Rumps, NP  glucosamine-chondroitin 500-400 MG tablet Take 1 tablet by mouth daily.    Yes [provider]  metoprolol tartrate (LOPRESSOR) 25 MG tablet Take 0.5 tablets (12.5 mg total) by mouth 2 (two) times daily. 01/12/20  Yes Martyn Ehrich, NP  Multiple Vitamins-Minerals (PRESERVISION AREDS 2) CAPS Take 1 capsule by mouth 2 (two) times daily.   Yes [provider]  Nintedanib (OFEV) 150 MG CAPS Take 150 mg by mouth 2 (two) times daily with a meal.   Yes [provider]  predniSONE (DELTASONE) 10 MG tablet Take 3 tablets (30 mg total) by mouth daily with breakfast. Take 30 mg daily until seen by Dr. Chase Caller on 01/24/20. 01/12/20  Yes Martyn Ehrich, NP  PROAIR HFA 108 (90 Base) MCG/ACT inhaler INHALE 2 PUFFS BY MOUTH INTO THE LUNGS EVERY 6 HOURS AS NEEDED FOR WHEEZING OR SHORTNESS OF BREATH Patient taking differently: Inhale 2 puffs into the lungs every 6  (six) hours as needed for wheezing or shortness of breath.  08/04/19  Yes Brand Males, MD  rosuvastatin (CRESTOR) 5 MG tablet TAKE 1 TABLET(5 MG) BY MOUTH DAILY Patient taking differently: Take 5 mg by mouth at bedtime.  09/15/19  Yes Nafziger, Tommi Rumps, NP  umeclidinium bromide (INCRUSE ELLIPTA) 62.5 MCG/INH AEPB Inhale 1 puff into the lungs daily. 07/12/19  Yes Brand Males, MD     Montey Hora, [facility] 01/17/2020, 11:29 AM

## 2020-01-17 NOTE — Progress Notes (Signed)
PROGRESS NOTE    Jason Summers  ZDG:[id number] DOB: Oct 25, 1931 DOA: 01/09/2020 PCP: [name], NP   Brief Narrative:  HPI: Jason Summers is a 86 y.o. male with medical history significant of lung cancer-in remission, post chemo and radiation therapy, idiopathic pulmonary fibrosis on chronic steroids andNintendanib, sinus tachycardia, hyperlipidemia, depression/anxiety, GERD presents to emergency department with worsening shortness of breath since couple of days.  Patient's wife at the bedside tells me that patient was hospitalized from 12/28/2019 to 01/04/2020 with sepsis/acute hypoxic respiratory failure suspect secondary to IPF exacerbation and CAP and discharged home in a stable condition on p.o. doxy.  Patient's wife tells me that since his discharge from the hospital patient has progressive worsening shortness of breath, he is unable to walk,and this morning he was unable to catch his breath therefore she called EMS and brought patient to the emergency department for further evaluation and management.   Patient's wife tells me that patient had tachycardia at home therefore his PCP started him on metoprolol 12.5 mg twice daily which helps with his tachycardia.  His prednisone dose was also increased from 15 mg to 30 mg.  Patient has chronic productive cough with white phlegm however denies change in phlegm color, worsening of cough, wheezing, chest pain, leg swelling, orthopnea, PND, fever, chills, nausea, vomiting, headache, blurry vision, decreased appetite, abdominal pain.  Reports loose stools since last 2 days however denies melena.  No history of smoking, alcohol, illicit drug use.  He uses 4 L of oxygen at baseline at home.  ED Course: Upon arrival to ED: Patient tachypneic, requiring 4 L of oxygen via nasal cannula which is his baseline, afebrile with no leukocytosis.  Initial labs including CBC: WNL CMP shows elevated liver enzymes of AST: 109, ALT: 144, BNP: 114, COVID-19 negative,  chest x-ray shows chronic lung disease with suspected superimposed right midlung pneumonia.  Patient was given IV Vanco and cefepime for the concern of hospital-acquired pneumonia.  [facility] hospitalist consulted for admission.  Assessment & Plan:   Principal Problem:   Right middle lobe pneumonia Active Problems:   Gastroesophageal reflux disease   IPF (idiopathic pulmonary fibrosis) (HCC)   HTN (hypertension)   HLD (hyperlipidemia)   Depression with anxiety   Chronic hypoxemic respiratory failure (HCC)   Elevated liver enzymes   Acute on chronic hypoxic respiratory failure secondary to hospital-acquired pneumonia vs IPF flareup vs pneumonitis: Patient recently hospitalized and discharged on 7/28.  Presented with worsening shortness of breath.  Chest x-ray shows new right middle lobe opacity.  He was tachypneic, afebrile with no leukocytosis.  Was discharged on 4 L nasal cannula oxygen recently.  Was on 10 L high flow this morning.  He was also started on IV vancomycin and cefepime for hospital-acquired pneumonia.  Also started on IV Solu-Medrol.  Was recently discharged on prednisone 30 mg p.o.  I personally reviewed chest x-ray.  I personally doubt pneumonia.  I think he has either pneumonitis or IPF flareup and possibly some pulmonary edema.  Consulted PCCM and they recommend continuing antibiotics.  Will let them take the charge of this issue.  Continue rest of the medications.  Please see their note for details.  I will give him a dose of Lasix 40 mg IV.  Elevated liver enzymes: Somewhat improvement in LFTs.  Acute viral hepatitis panel negative.  Sinus Tachycardia: Stable.  Recently started on metoprolol by PCCM.  Continue metoprolol 12.5 mg twice daily  Hyperlipidemia: Continue statin  GERD: Continue Tums/PPI  Depression/anxiety:  Continue Celexa and Xanax as needed  DVT prophylaxis: enoxaparin (LOVENOX) injection 40 mg Start: 01/19/2020 1800 SCDs Start: 01/24/2020 1229   Code  Status: Full Code  Family Communication:  None present at bedside.  Plan of care discussed with patient in length and he verbalized understanding and agreed with it.  Later PCCM also discussed in length with patient and his wife who was present at the bedside.  Status is: Inpatient  Remains inpatient appropriate because:Inpatient level of care appropriate due to severity of illness   Dispo: The patient is from: Home              Anticipated d/c is to: Home              Anticipated d/c date is: 2 days              Patient currently is not medically stable to d/c.        Estimated body mass index is 25.83 kg/m as calculated from the following:   Height as of this encounter: _0  (1.676 m).   Weight as of this encounter: 72.6 kg.      Nutritional status:               Consultants:   PCCM  Procedures:   None  Antimicrobials:  Anti-infectives (From admission, onward)   Start     Dose/Rate Route Frequency Ordered Stop   01/17/20 0000  vancomycin (VANCOREADY) IVPB 750 mg/150 mL     Discontinue     750 mg 150 mL/hr over 60 Minutes Intravenous Every 12 hours 02/04/2020 1119     01/23/2020 2200  ceFEPIme (MAXIPIME) 2 g in sodium chloride 0.9 % 100 mL IVPB     Discontinue     2 g 200 mL/hr over 30 Minutes Intravenous Every 12 hours 01/15/2020 1342     01/24/2020 1030  ceFEPIme (MAXIPIME) 2 g in sodium chloride 0.9 % 100 mL IVPB        2 g 200 mL/hr over 30 Minutes Intravenous  Once 01/29/2020 1016 02/04/2020 1232   01/28/2020 1030  vancomycin (VANCOREADY) IVPB 1500 mg/300 mL        1,500 mg 150 mL/hr over 120 Minutes Intravenous  Once 01/17/2020 1017 01/24/2020 1441         Subjective: Patient seen and examined.  Despite of requiring more oxygen than yesterday, he stated that he was feeling better than yesterday.  He did not have any specific complaint.  Had hoarse voice.  This is chronic for him.  Objective: Vitals:   01/17/20 0323 01/17/20 0803 01/17/20 0807 01/17/20  0900  BP:  114/75    Pulse:  69 71 65  Resp:  (!) 35 (!) 28 17  Temp:  97.9 F (36.6 C)    TempSrc:  Axillary    SpO2:  99% 98% 99%  Weight: 72.6 kg     Height:        Intake/Output Summary (Last 24 hours) at 01/17/2020 1242 Last data filed at 01/17/2020 0500 Gross per 24 hour  Intake 250 ml  Output 275 ml  Net -25 ml   Filed Weights   02/02/2020 0947 01/17/20 0323  Weight: 72.6 kg 72.6 kg    Examination:  General exam: Appears calm and comfortable  Respiratory system: Rales and rhonchi bilaterally. Respiratory effort normal. Cardiovascular system: S1 & S2 heard, RRR. No JVD, murmurs, rubs, gallops or clicks. No pedal edema. Gastrointestinal system: Abdomen is nondistended, soft and nontender.  No organomegaly or masses felt. Normal bowel sounds heard. Central nervous system: Alert and oriented. No focal neurological deficits. Extremities: Symmetric 5 x 5 power. Skin: No rashes, lesions or ulcers Psychiatry: Judgement and insight appear normal. Mood & affect appropriate.    Data Reviewed: I have personally reviewed following labs and imaging studies  CBC: Recent Labs  Lab 01/26/2020 0953 01/12/2020 1248 01/15/2020 1312 01/17/20 0125  WBC 8.7  --  7.8 5.9  NEUTROABS 7.4  --   --   --   HGB 14.1 13.3 13.4 12.9*  HCT 44.9 39.0 42.4 40.3  MCV 92.2  --  92.4 91.6  PLT 179  --  168 767   Basic Metabolic Panel: Recent Labs  Lab 01/25/2020 0953 01/09/2020 1248 01/08/2020 1312 01/17/20 0125  NA 139 136  --  136  K 3.9 3.7  --  4.8  CL 95*  --   --  97*  CO2 31  --   --  29  GLUCOSE 95  --   --  144*  BUN 13  --   --  20  CREATININE 0.90  --  0.89 1.17  CALCIUM 8.7*  --   --  8.3*  MG  --   --  2.1  --   PHOS  --   --  2.3*  --    GFR: Estimated Creatinine Clearance: 40.9 mL/min (by C-G formula based on SCr of 1.17 mg/dL). Liver Function Tests: Recent Labs  Lab 02/06/2020 0953 01/17/20 0125  AST 109* 62*  ALT 144* 116*  ALKPHOS 73 73  BILITOT 0.8 1.1  PROT 6.2*  5.6*  ALBUMIN 2.7* 2.4*   No results for input(s): LIPASE, AMYLASE in the last 168 hours. No results for input(s): AMMONIA in the last 168 hours. Coagulation Profile: Recent Labs  Lab 01/17/2020 1312  INR 1.1   Cardiac Enzymes: No results for input(s): CKTOTAL, CKMB, CKMBINDEX, TROPONINI in the last 168 hours. BNP (last 3 results) Recent Labs    09/19/19 1105  PROBNP 275   HbA1C: No results for input(s): HGBA1C in the last 72 hours. CBG: No results for input(s): GLUCAP in the last 168 hours. Lipid Profile: Recent Labs    01/12/2020 1312  CHOL 128  HDL 56  LDLCALC 57  TRIG 77  CHOLHDL 2.3   Thyroid Function Tests: No results for input(s): TSH, T4TOTAL, FREET4, T3FREE, THYROIDAB in the last 72 hours. Anemia Panel: No results for input(s): VITAMINB12, FOLATE, FERRITIN, TIBC, IRON, RETICCTPCT in the last 72 hours. Sepsis Labs: Recent Labs  Lab 02/03/2020 1305 01/31/2020 1312 01/10/2020 1748  PROCALCITON  --  <0.10  --   LATICACIDVEN 1.3  --  1.9    Recent Results (from the past 240 hour(s))  SARS Coronavirus 2 by RT PCR (hospital order, performed in [facility] hospital lab) Nasopharyngeal Nasopharyngeal Swab     Status: None   Collection Time: 01/21/2020  9:54 AM   Specimen: Nasopharyngeal Swab  Result Value Ref Range Status   SARS Coronavirus 2 NEGATIVE NEGATIVE Final    Comment: (NOTE) SARS-CoV-2 target nucleic acids are NOT DETECTED.  The SARS-CoV-2 RNA is generally detectable in upper and lower respiratory specimens during the acute phase of infection. The lowest concentration of SARS-CoV-2 viral copies this assay can detect is 250 copies / mL. A negative result does not preclude SARS-CoV-2 infection and should not be used as the sole basis for treatment or other patient management decisions.  A negative result may occur  with improper specimen collection / handling, submission of specimen other than nasopharyngeal swab, presence of viral mutation(s) within  the areas targeted by this assay, and inadequate number of viral copies (<250 copies / mL). A negative result must be combined with clinical observations, patient history, and epidemiological information.  Fact Sheet for Patients:   StrictlyIdeas.no  Fact Sheet for Healthcare Providers: BankingDealers.co.za  This test is not yet approved or  cleared by the [place] FDA and has been authorized for detection and/or diagnosis of SARS-CoV-2 by FDA under an Emergency Use Authorization (EUA).  This EUA will remain in effect (meaning this test can be used) for the duration of the COVID-19 declaration under Section 564(b)(1) of the Act, 21 U.S.C. section 360bbb-3(b)(1), unless the authorization is terminated or revoked sooner.  Performed at [facility], [place] [address] [place], [place] [address]   Culture, blood (routine x 2)     Status: None (Preliminary result)   Collection Time: 01/13/2020 12:45 PM   Specimen: BLOOD RIGHT ARM  Result Value Ref Range Status   Specimen Description BLOOD RIGHT ARM  Final   Special Requests   Final    BOTTLES DRAWN AEROBIC AND ANAEROBIC Blood Culture adequate volume   Culture   Final    NO GROWTH < 24 HOURS Performed at [facility], [place] [address], [place],  [address]    Report Status PENDING  Incomplete  Culture, blood (routine x 2)     Status: None (Preliminary result)   Collection Time: 01/11/2020  1:00 PM   Specimen: BLOOD RIGHT HAND  Result Value Ref Range Status   Specimen Description BLOOD RIGHT HAND  Final   Special Requests   Final    BOTTLES DRAWN AEROBIC AND ANAEROBIC Blood Culture adequate volume   Culture   Final    NO GROWTH < 24 HOURS Performed at [facility], [place] [address] [place],  [address]    Report Status PENDING  Incomplete      Radiology Studies: DG Chest Port 1 View  Result Date: 01/08/2020 CLINICAL DATA:  Shortness of breath. History  of lung cancer and pulmonary fibrosis. EXAM: PORTABLE CHEST 1 VIEW COMPARISON:  Chest x-ray 12/28/2019 FINDINGS: The cardiac silhouette, mediastinal and hilar contours are stable. Persistent severe chronic lung disease with pulmonary fibrosis and pleural effusions. Increased density in the right mid lung suspicious for superimposed pneumonia. IMPRESSION: Chronic lung disease with suspected superimposed right mid lung pneumonia. Electronically Signed   By: Marijo Sanes M.D.   On: 01/22/2020 10:11   US Abdomen Limited RUQ  Result Date: 01/13/2020 CLINICAL DATA:  86 year old male with elevated liver enzymes. EXAM: ULTRASOUND ABDOMEN LIMITED RIGHT UPPER QUADRANT COMPARISON:  None. FINDINGS: Evaluation is limited due to body habitus and overlying bowel gas. Gallbladder: No gallstones or wall thickening visualized. No sonographic Murphy sign noted by sonographer. Common bile duct: Not visualized. Liver: There is diffuse increased liver echogenicity most commonly seen in the setting of fatty infiltration. Superimposed inflammation or fibrosis is not excluded. Clinical correlation is recommended. Portal vein is patent on color Doppler imaging with normal direction of blood flow towards the liver. Other: None. IMPRESSION: 1. No gallstone. 2. Fatty liver. Electronically Signed   By: Anner Crete M.D.   On: 01/09/2020 22:33    Scheduled Meds: . citalopram  10 mg Oral Daily  . enoxaparin (LOVENOX) injection  40 mg Subcutaneous Q24H  . methylPREDNISolone (SOLU-MEDROL) injection  60 mg Intravenous Q12H  .  metoprolol tartrate  12.5 mg Oral BID  . Nintedanib  150 mg Oral BID WC  . pantoprazole  80 mg Oral Q1200  . rosuvastatin  5 mg Oral QHS  . umeclidinium bromide  1 puff Inhalation Daily   Continuous Infusions: . ceFEPime (MAXIPIME) IV 2 g (01/17/20 1146)  . vancomycin 750 mg (01/17/20 0022)     LOS: 1 day   Time spent: 37 minutes   Darliss Cheney, MD [facility]  01/17/2020, 12:42 PM   To  contact the attending provider between 7A-7P or the covering provider during after hours 7P-7A, please log into the web site [email or web address].

## 2020-01-18 ENCOUNTER — Inpatient Hospital Stay: Payer: Medicare Other | Admitting: Adult Health

## 2020-01-18 DIAGNOSIS — J9611 Chronic respiratory failure with hypoxia: Secondary | ICD-10-CM | POA: Diagnosis not present

## 2020-01-18 DIAGNOSIS — J189 Pneumonia, unspecified organism: Secondary | ICD-10-CM | POA: Diagnosis not present

## 2020-01-18 DIAGNOSIS — F418 Other specified anxiety disorders: Secondary | ICD-10-CM | POA: Diagnosis not present

## 2020-01-18 DIAGNOSIS — R748 Abnormal levels of other serum enzymes: Secondary | ICD-10-CM | POA: Diagnosis not present

## 2020-01-18 DIAGNOSIS — K219 Gastro-esophageal reflux disease without esophagitis: Secondary | ICD-10-CM

## 2020-01-18 DIAGNOSIS — J84112 Idiopathic pulmonary fibrosis: Secondary | ICD-10-CM | POA: Diagnosis not present

## 2020-01-18 LAB — MRSA PCR SCREENING: MRSA by PCR: NEGATIVE

## 2020-01-18 MED ORDER — ALPRAZOLAM 0.25 MG PO TABS
0.2500 mg | ORAL_TABLET | Freq: Three times a day (TID) | ORAL | Status: DC | PRN
  Administered 2020-01-18 – 2020-01-23 (×9): 0.25 mg via ORAL
  Filled 2020-01-18 (×10): qty 1

## 2020-01-18 NOTE — Progress Notes (Addendum)
NAME:  Jason Summers, MRN:  [id number], DOB:  19-Oct-1931, LOS: 2 ADMISSION DATE:  01/15/2020, CONSULTATION DATE:  01/17/20 REFERRING MD:  Doristine Bosworth  CHIEF COMPLAINT:  Dyspnea   Brief History   Jason Summers is a 86 y.o. male who was admitted 8/9 with acute on chronic hypoxic respiratory failure 2/2 IPF flare and possible HCAP.  History of present illness   Jason Summers is a 86 y.o. male who has a PMH including but not limited to IPF on steroids and OFEV and followed by Dr. Chase Caller, remote lung CA s/p chemo and XRT now in [place] (see "past medical history" for rest).  He presented to [facility] ED 8/9 with worsening dyspnea and generalized fatigue for several days.  He had recent admission 12/28/19 through 01/04/20 for acute on chronic hypoxic respiratory failure 2/2 IPF flare and CAP.  He was discharged and had follow up scheduled with Dr. Chase Caller on 01/24/20.  Wife states since discharge, he had progressively worsened to the point of extreme dyspnea mainly with any type of exertion.  She got worried when his sats were in the 60's despite baseline 4L O2 and HR in 120's; therefore, called EMS.  He has had ongoing chronic cough without change in characteristics of phlegm.  Denies any recent fevers/chills/sweats, headaches, N/V, abd pain.  Has had mild diarrhea x 2 days.  He was admitted with concern for HCAP and was started on vanc / cefepime.  On 8/10, he required HFNC at 10L; therefore, PCCM asked to see in consultation.  Past Medical History  has HYPERCHOLESTEROLEMIA, MILD; Acute stress disorder; HEMORRHOIDS, INTERNAL; Osteoarthritis; Soft tissue lesion of shoulder region; GANGLION OF JOINT; Blush; DYSPHAGIA, PHARYNGOESOPHAGEAL PHASE; SPRAIN AND STRAIN OF SACROILIAC; Dysphagia; Multiple pulmonary nodules; Mediastinal lymphadenopathy; Mediastinal adenopathy; Mass of lingula of lung; Non-small cell cancer of left lung ([place]); Encounter for antineoplastic chemotherapy; Odynophagia; Headache; History of esophageal  stricture; Gastroesophageal reflux disease; Nausea and vomiting; Pleural effusion on left; Pulmonary emphysema ([place]); Radiation fibrosis of lung ([place]); Shortness of breath; Chest pain in adult; Chronic right-sided thoracic back pain; Persistent headaches; IPF (idiopathic pulmonary fibrosis) ([place]); Anxiety; Acute rhinitis; Bilateral epiphora; Eustachian tube dysfunction, bilateral; Nuclear sclerosis of right eye; Pseudophakia, left eye; 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  Significant Hospital Events   8/9 > admit.  Consults:  PCCM.  Procedures:  None.  Significant Diagnostic Tests:  CXR 8/9 > RML opacity.  Micro Data:  Blood 8/9 >  COVID 8/9 > neg.  Antimicrobials:  Vanc 8/9 > 8/11 Cefepime 8/9 >    Interim history/subjective:  No acute events overnight. Feeling a bit better, but still pretty bad. Breathing slightly improved. Wife is worried about significant DOE  Objective:  Blood pressure 103/72, pulse (!) 102, temperature 98.2 F (36.8 C), temperature source Oral, resp. rate (!) 42, height _0  (1.676 m), weight 73.8 kg, SpO2 90 %.        Intake/Output Summary (Last 24 hours) at 01/18/2020 1125 Last data filed at 01/18/2020 1018 Gross per 24 hour  Intake 720 ml  Output 1600 ml  Net -880 ml   Filed Weights   01/28/2020 0947 01/17/20 0323 01/18/20 0335  Weight: 72.6 kg 72.6 kg 73.8 kg    Examination: General: Elderly  male in NAD Neuro: Alert, oriented, non-focal HEENT: NCAT. PERRL, no JVD Cardiovascular: RRR, no M/R/G.  Lungs: Clear, unlabored on 5L [place] high flow Abdomen: BS x 4, soft, NT/ND.  Musculoskeletal: no acute  deformity or edema.  Skin: Intact, warm, no rashes.  Assessment & Plan:   Acute on chronic hypoxic respiratory failure - multifactorial, likely combination of IPF flare, possible HAP, generalized deconditioning. - Wean [place] O2 for sats > 88%.  - DC vancomycin in light of negative MRSA swab.  - Continue solumderol and transition back to oral prednisone 1m upon symptomatic improvement.  - Continue OFEV, Bronchodilators. Trending LFT  Generalized deconditioning. - Needs aggressive PT. - The patient's wife is concerned regarding her ability to care for him at home. Consider PT/OT evaluation.   Wife and patient updated in tandem at bedside.   Labs   CBC: Recent Labs  Lab 01/31/2020 0953 01/31/2020 1248 01/12/2020 1312 01/17/20 0125  WBC 8.7  --  7.8 5.9  NEUTROABS 7.4  --   --   --   HGB 14.1 13.3 13.4 12.9*  HCT 44.9 39.0 42.4 40.3  MCV 92.2  --  92.4 91.6  PLT 179  --  168 1856  Basic Metabolic Panel: Recent Labs  Lab 01/14/2020 0953 01/22/2020 1248 01/11/2020 1312 01/17/20 0125  NA 139 136  --  136  K 3.9 3.7  --  4.8  CL 95*  --   --  97*  CO2 31  --   --  29  GLUCOSE 95  --   --  144*  BUN 13  --   --  20  CREATININE 0.90  --  0.89 1.17  CALCIUM 8.7*  --   --  8.3*  MG  --   --  2.1  --   PHOS  --   --  2.3*  --    GFR: Estimated Creatinine Clearance: 40.9 mL/min (by C-G formula based on SCr of 1.17 mg/dL). Recent Labs  Lab 02/03/2020 0953 01/25/2020 1305 01/18/2020 1312 01/26/2020 1748 01/17/20 0125  PROCALCITON  --   --  <0.10  --   --   WBC 8.7  --  7.8  --  5.9  LATICACIDVEN  --  1.3  --  1.9  --    Liver Function Tests: Recent Labs  Lab 01/10/2020 0953 01/17/20 0125  AST 109* 62*  ALT 144* 116*  ALKPHOS 73 73  BILITOT 0.8 1.1  PROT 6.2* 5.6*  ALBUMIN 2.7* 2.4*   No results for input(s): LIPASE, AMYLASE in the last 168 hours. No results for input(s): AMMONIA in the last 168 hours. ABG    Component Value Date/Time   PHART 7.467 (H) 01/19/2020 1248    PCO2ART 44.5 01/21/2020 1248   PO2ART 73 (L) 01/24/2020 1248   HCO3 32.2 (H) 02/04/2020 1248   TCO2 34 (H) 01/19/2020 1248   O2SAT 95.0 01/23/2020 1248    Coagulation Profile: Recent Labs  Lab 02/04/2020 1312  INR 1.1   Cardiac Enzymes: No results for input(s): CKTOTAL, CKMB, CKMBINDEX, TROPONINI in the last 168 hours. HbA1C: No results found for: HGBA1C CBG: No results for input(s): GLUCAP in the last 168 hours.   PGeorgann Housekeeper AGACNP-BC [place] See Amion for personal pager PCCM on call pager [phone] 01/18/2020 11:31 AM

## 2020-01-18 NOTE — Progress Notes (Signed)
PROGRESS NOTE    Jason Summers  VEL:[id number] DOB: Nov 03, 1931 DOA: 01/11/2020 PCP: [name], NP   Chief Complaint  Patient presents with  . Respiratory Distress    Brief Narrative:  86 year old male prior history of lung cancer in remission, s/p chemo and radiation therapy, idiopathic pulmonary fibrosis on chronic steroids, sinus tachycardia, hyperlipidemia, depression, anxiety, GERD presents to ED with worsening shortness of breath.  He was admitted for IPF exacerbation and community-acquired pneumonia from 7/21/202127/28/2021 and discharged home in stable condition on doxycycline.  But presents to ED with progression of shortness of breath.  Currently he is requiring up to 5 L of nasal cannula oxygen to keep sats greater than 90%.  Due to persistent hypoxia and increased oxygen requirement PCCM consulted for recommendations.   Assessment & Plan:   Principal Problem:   Right middle lobe pneumonia Active Problems:   Gastroesophageal reflux disease   IPF (idiopathic pulmonary fibrosis) (HCC)   HTN (hypertension)   HLD (hyperlipidemia)   Depression with anxiety   Chronic hypoxemic respiratory failure (HCC)   Elevated liver enzymes  Acute on chronic respiratory failure secondary to hospital-acquired pneumonia and IPF flareup  Continue cefepime and PCCM added IV Solu-Medrol for the management of IPF flareup. Patient urine for strep pneumonia and Legionella antigens are negative at this time. Continue with duo nebs.  Elevated liver enzymes Acute viral hepatitis panel is negative. Appears to be improving at this time.   Anxiety As needed Xanax.   Depression Continue Celexa.   GERD PPI   Essential hypertension Blood pressure parameters appear to be optimal at this time.    DVT prophylaxis: (Lovenox) Code Status: Full code Family Communication: Family at bedside Disposition:   Status is: Inpatient  Remains inpatient appropriate because:IV treatments  appropriate due to intensity of illness or inability to take PO   Dispo: The patient is from: Home              Anticipated d/c is to: pending.               Anticipated d/c date is: > 3 days              Patient currently is not medically stable to d/c.       Consultants:   PCCM.    Procedures: none.    Antimicrobials: Anti-infectives (From admission, onward)   Start     Dose/Rate Route Frequency Ordered Stop   01/17/20 0000  vancomycin (VANCOREADY) IVPB 750 mg/150 mL  Status:  Discontinued        750 mg 150 mL/hr over 60 Minutes Intravenous Every 12 hours 01/08/2020 1119 01/18/20 1436   01/11/2020 2200  ceFEPIme (MAXIPIME) 2 g in sodium chloride 0.9 % 100 mL IVPB     Discontinue     2 g 200 mL/hr over 30 Minutes Intravenous Every 12 hours 01/23/2020 1342     01/27/2020 1030  ceFEPIme (MAXIPIME) 2 g in sodium chloride 0.9 % 100 mL IVPB        2 g 200 mL/hr over 30 Minutes Intravenous  Once 01/21/2020 1016 01/26/2020 1232   01/25/2020 1030  vancomycin (VANCOREADY) IVPB 1500 mg/300 mL        1,500 mg 150 mL/hr over 120 Minutes Intravenous  Once 02/07/2020 1017 01/13/2020 1441          Subjective: No new complaints.   Objective: Vitals:   01/18/20 0335 01/18/20 0336 01/18/20 0811 01/18/20 1235  BP:  125/82 103/72 133/82  Pulse:  74 (!) 102 72  Resp:  (!) 23 (!) 42 (!) 33  Temp:  97.8 F (36.6 C) 98.2 F (36.8 C) 97.7 F (36.5 C)  TempSrc:  Oral Oral Oral  SpO2:  95% 90% 95%  Weight: 73.8 kg     Height:        Intake/Output Summary (Last 24 hours) at 01/18/2020 1530 Last data filed at 01/18/2020 1018 Gross per 24 hour  Intake 600 ml  Output 1200 ml  Net -600 ml   Filed Weights   01/20/2020 0947 01/17/20 0323 01/18/20 0335  Weight: 72.6 kg 72.6 kg 73.8 kg    Examination:  General exam: Appears calm and comfortable ON 5 LIT of [place] of oxygen.  Respiratory system: Clear to auscultation. Respiratory effort normal. Cardiovascular system: S1 & S2 heard, RRR. No JVD,  No  pedal edema. Gastrointestinal system: Abdomen is nondistended, soft and nontender.Normal bowel sounds heard. Central nervous system: Alert and oriented. No focal neurological deficits. Extremities: Symmetric 5 x 5 power. Skin: No rashes, lesions or ulcers Psychiatry:  Mood & affect appropriate.     Data Reviewed: I have personally reviewed following labs and imaging studies  CBC: Recent Labs  Lab 02/03/2020 0953 02/01/2020 1248 02/02/2020 1312 01/17/20 0125  WBC 8.7  --  7.8 5.9  NEUTROABS 7.4  --   --   --   HGB 14.1 13.3 13.4 12.9*  HCT 44.9 39.0 42.4 40.3  MCV 92.2  --  92.4 91.6  PLT 179  --  168 381    Basic Metabolic Panel: Recent Labs  Lab 01/26/2020 0953 01/13/2020 1248 02/05/2020 1312 01/17/20 0125  NA 139 136  --  136  K 3.9 3.7  --  4.8  CL 95*  --   --  97*  CO2 31  --   --  29  GLUCOSE 95  --   --  144*  BUN 13  --   --  20  CREATININE 0.90  --  0.89 1.17  CALCIUM 8.7*  --   --  8.3*  MG  --   --  2.1  --   PHOS  --   --  2.3*  --     GFR: Estimated Creatinine Clearance: 40.9 mL/min (by C-G formula based on SCr of 1.17 mg/dL).  Liver Function Tests: Recent Labs  Lab 01/15/2020 0953 01/17/20 0125  AST 109* 62*  ALT 144* 116*  ALKPHOS 73 73  BILITOT 0.8 1.1  PROT 6.2* 5.6*  ALBUMIN 2.7* 2.4*    CBG: No results for input(s): GLUCAP in the last 168 hours.   Recent Results (from the past 240 hour(s))  SARS Coronavirus 2 by RT PCR (hospital order, performed in [facility] hospital lab) Nasopharyngeal Nasopharyngeal Swab     Status: None   Collection Time: 02/04/2020  9:54 AM   Specimen: Nasopharyngeal Swab  Result Value Ref Range Status   SARS Coronavirus 2 NEGATIVE NEGATIVE Final    Comment: (NOTE) SARS-CoV-2 target nucleic acids are NOT DETECTED.  The SARS-CoV-2 RNA is generally detectable in upper and lower respiratory specimens during the acute phase of infection. The lowest concentration of SARS-CoV-2 viral copies this assay can detect is  250 copies / mL. A negative result does not preclude SARS-CoV-2 infection and should not be used as the sole basis for treatment or other patient management decisions.  A negative result may occur with improper specimen collection / handling, submission of specimen other than nasopharyngeal swab,  presence of viral mutation(s) within the areas targeted by this assay, and inadequate number of viral copies (<250 copies / mL). A negative result must be combined with clinical observations, patient history, and epidemiological information.  Fact Sheet for Patients:   StrictlyIdeas.no  Fact Sheet for Healthcare Providers: BankingDealers.co.za  This test is not yet approved or  cleared by the [place] FDA and has been authorized for detection and/or diagnosis of SARS-CoV-2 by FDA under an Emergency Use Authorization (EUA).  This EUA will remain in effect (meaning this test can be used) for the duration of the COVID-19 declaration under Section 564(b)(1) of the Act, 21 U.S.C. section 360bbb-3(b)(1), unless the authorization is terminated or revoked sooner.  Performed at [facility], [place] [address], [place], [place] [address]   Culture, blood (routine x 2)     Status: None (Preliminary result)   Collection Time: 01/08/2020 12:45 PM   Specimen: BLOOD RIGHT ARM  Result Value Ref Range Status   Specimen Description BLOOD RIGHT ARM  Final   Special Requests   Final    BOTTLES DRAWN AEROBIC AND ANAEROBIC Blood Culture adequate volume   Culture   Final    NO GROWTH 2 DAYS Performed at [facility], [place] [address], [place], [place] [address]    Report Status PENDING  Incomplete  Culture, blood (routine x 2)     Status: None (Preliminary result)   Collection Time: 01/17/2020  1:00 PM   Specimen: BLOOD RIGHT HAND  Result Value Ref Range Status   Specimen Description BLOOD RIGHT HAND  Final   Special Requests   Final    BOTTLES  DRAWN AEROBIC AND ANAEROBIC Blood Culture adequate volume   Culture   Final    NO GROWTH 2 DAYS Performed at [facility], [place] [address], [place], [place] [address]    Report Status PENDING  Incomplete  MRSA PCR Screening     Status: None   Collection Time: 01/18/20 10:32 AM   Specimen: Nasal Mucosa; Nasopharyngeal  Result Value Ref Range Status   MRSA by PCR NEGATIVE NEGATIVE Final    Comment:        The GeneXpert MRSA Assay (FDA approved for NASAL specimens only), is one component of a comprehensive MRSA colonization surveillance program. It is not intended to diagnose MRSA infection nor to guide or monitor treatment for MRSA infections. Performed at [facility], [place] [address] [place], [place] [address]          Radiology Studies: US Abdomen Limited RUQ  Result Date: 02/01/2020 CLINICAL DATA:  86 year old male with elevated liver enzymes. EXAM: ULTRASOUND ABDOMEN LIMITED RIGHT UPPER QUADRANT COMPARISON:  None. FINDINGS: Evaluation is limited due to body habitus and overlying bowel gas. Gallbladder: No gallstones or wall thickening visualized. No sonographic Murphy sign noted by sonographer. Common bile duct: Not visualized. Liver: There is diffuse increased liver echogenicity most commonly seen in the setting of fatty infiltration. Superimposed inflammation or fibrosis is not excluded. Clinical correlation is recommended. Portal vein is patent on color Doppler imaging with normal direction of blood flow towards the liver. Other: None. IMPRESSION: 1. No gallstone. 2. Fatty liver. Electronically Signed   By: Anner Crete M.D.   On: 01/27/2020 22:33        Scheduled Meds: . citalopram  10 mg Oral Daily  . enoxaparin (LOVENOX) injection  40 mg Subcutaneous Q24H  . methylPREDNISolone (SOLU-MEDROL) injection  60 mg Intravenous Q12H  .  metoprolol tartrate  12.5 mg Oral BID  . Nintedanib  150 mg Oral BID WC  . pantoprazole  80 mg Oral Q1200  . rosuvastatin   5 mg Oral QHS  . umeclidinium bromide  1 puff Inhalation Daily   Continuous Infusions: . ceFEPime (MAXIPIME) IV 2 g (01/18/20 0845)     LOS: 2 days       Hosie Poisson, MD [facility]   To contact the attending provider between 7A-7P or the covering provider during after hours 7P-7A, please log into the web site [email or web address] and access using universal [place] password for that web site. If you do not have the password, please call the hospital operator.  01/18/2020, 3:30 PM

## 2020-01-18 NOTE — Progress Notes (Signed)
Pt refused to it on side of bed or stand.  Stated he can't breathe

## 2020-01-19 DIAGNOSIS — J9601 Acute respiratory failure with hypoxia: Secondary | ICD-10-CM | POA: Diagnosis not present

## 2020-01-19 DIAGNOSIS — J84112 Idiopathic pulmonary fibrosis: Secondary | ICD-10-CM | POA: Diagnosis not present

## 2020-01-19 DIAGNOSIS — F418 Other specified anxiety disorders: Secondary | ICD-10-CM | POA: Diagnosis not present

## 2020-01-19 DIAGNOSIS — R748 Abnormal levels of other serum enzymes: Secondary | ICD-10-CM | POA: Diagnosis not present

## 2020-01-19 DIAGNOSIS — J9611 Chronic respiratory failure with hypoxia: Secondary | ICD-10-CM | POA: Diagnosis not present

## 2020-01-19 DIAGNOSIS — J189 Pneumonia, unspecified organism: Secondary | ICD-10-CM | POA: Diagnosis not present

## 2020-01-19 NOTE — Progress Notes (Signed)
NAME:  Jason Summers, MRN:  [id number], DOB:  Apr 10, 1932, LOS: 3 ADMISSION DATE:  01/17/2020, CONSULTATION DATE:  01/17/20 REFERRING MD:  Doristine Bosworth  CHIEF COMPLAINT:  Dyspnea   Brief History   Arrow Tomko is a 86 y.o. male who was admitted 8/9 with acute on chronic hypoxic respiratory failure 2/2 IPF flare and possible HCAP.  History of present illness   Bernerd Dwiggins is a 86 y.o. male who has a PMH including but not limited to IPF on steroids and OFEV and followed by Dr. Chase Caller, remote lung CA s/p chemo and XRT now in [place] (see "past medical history" for rest).  He presented to [facility] ED 8/9 with worsening dyspnea and generalized fatigue for several days.  He had recent admission 12/28/19 through 01/04/20 for acute on chronic hypoxic respiratory failure 2/2 IPF flare and CAP.  He was discharged and had follow up scheduled with Dr. Chase Caller on 01/24/20.  Wife states since discharge, he had progressively worsened to the point of extreme dyspnea mainly with any type of exertion.  She got worried when his sats were in the 60's despite baseline 4L O2 and HR in 120's; therefore, called EMS.  He has had ongoing chronic cough without change in characteristics of phlegm.  Denies any recent fevers/chills/sweats, headaches, N/V, abd pain.  Has had mild diarrhea x 2 days.  He was admitted with concern for HCAP and was started on vanc / cefepime.  On 8/10, he required HFNC at 10L; therefore, PCCM asked to see in consultation.  Past Medical History  has HYPERCHOLESTEROLEMIA, MILD; Acute stress disorder; HEMORRHOIDS, INTERNAL; Osteoarthritis; Soft tissue lesion of shoulder region; GANGLION OF JOINT; Blush; DYSPHAGIA, PHARYNGOESOPHAGEAL PHASE; SPRAIN AND STRAIN OF SACROILIAC; Dysphagia; Multiple pulmonary nodules; Mediastinal lymphadenopathy; Mediastinal adenopathy; Mass of lingula of lung; Non-small cell cancer of left lung ([place]); Encounter for antineoplastic chemotherapy; Odynophagia; Headache; History of esophageal  stricture; Gastroesophageal reflux disease; Nausea and vomiting; Pleural effusion on left; Pulmonary emphysema ([place]); Radiation fibrosis of lung ([place]); Shortness of breath; Chest pain in adult; Chronic right-sided thoracic back pain; Persistent headaches; IPF (idiopathic pulmonary fibrosis) ([place]); Anxiety; Acute rhinitis; Bilateral epiphora; Eustachian tube dysfunction, bilateral; Nuclear sclerosis of right eye; Pseudophakia, left eye; 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  Significant Hospital Events   8/9 > admit.  Consults:  PCCM.  Procedures:  None.  Significant Diagnostic Tests:  CXR 8/9 > RML opacity.  Micro Data:  Blood 8/9 >  COVID 8/9 > neg.  Antimicrobials:  Vanc 8/9 > 8/11 Cefepime 8/9 >    Interim history/subjective:  Remains on HFNC.  Feels a little better but still not able to speak normally without taking a break to catch breath.  Objective:  Blood pressure 116/69, pulse 95, temperature 97.6 F (36.4 C), temperature source Oral, resp. rate 19, height _0  (1.676 m), weight 71.6 kg, SpO2 91 %.        Intake/Output Summary (Last 24 hours) at 01/19/2020 1000 Last data filed at 01/19/2020 0900 Gross per 24 hour  Intake 570 ml  Output 900 ml  Net -330 ml   Filed Weights   01/17/20 0323 01/18/20 0335 01/19/20 0324  Weight: 72.6 kg 73.8 kg 71.6 kg    Examination: General: Elderly male,  frail, in NAD. Neuro: Alert, oriented, non-focal. HEENT: NCAT. PERRL, no JVD. Cardiovascular: RRR, no M/R/G.  Lungs: Normal effort at rest.  Faint crackles. Abdomen: BS x 4, soft, NT/ND.  Musculoskeletal: no acute deformity or edema.    Skin: Intact, warm, no rashes.  Assessment & Plan:   Acute on chronic hypoxic respiratory failure - multifactorial, likely combination of IPF flare, possible HAP, generalized deconditioning. - [place]an [place] O2 for sats > 88%.  - Continue solumderol and transition back to oral prednisone 51m upon symptomatic improvement.  - Suspect this will be a gradual process for improvement given how frail he is with poor reserve etc. - Continue low dose anxiolysis. - Can consider very low dose morphine for air hunger but caution with age. - Agree with palliative input. - Continue OFEV, Bronchodilators. Trending LFT. - CSW asked to evaluate as he'll likely need SNF at least for short term rehab efforts.  Generalized deconditioning. - Needs aggressive PT. - The patient's wife is concerned regarding her ability to care for him at home. Consider PT/OT evaluation.    RMontey Hora PClarkdalePulmonary & Critical Care Medicine 01/19/2020, 10:05 AM

## 2020-01-19 NOTE — Evaluation (Signed)
Occupational Therapy Evaluation Patient Details Name: Jason Summers MRN: [id number] DOB: [birth year] Today's Date: 01/19/2020    History of Present Illness 86 y.o. male presenting with worsening SOB x2-3 days and difficulty ambulating. Patient admitted with R middle lobe pneumonia. Patient with recent hospitalization from 7/21-7/28 with sepsis/acute hypoxic respiratory failure 2/2 IPF exacerbation and CAP. Since admission, patient with increased need for supplemental O2 of 10L via HFNC. PMHx significant for lung CA in remission s/p chemo and radiation tehrapy, ideopathic pulmonary fibrosis, HLD, depression/anxiety, and GERD.    Clinical Impression   PTA, patient was living with his wife and was independent with BADLs at baseline. Patient recently requiring increased need for assistance with use of DME. Patient currently presents below baseline level of function requiring Min guard to Min A grossly for functional transfers, mobility with RW and BADLs. Patient very limited by O2 desaturation on 10L via HFNC with short distance ambulation in room and increased RR of 44 with light activity. Patient would benefit from continued acute OT services in prep for d/c to next level of care with recommendation for SNF vs. HHOT pending patient progress and wife's ability to provide necessary assistance.     Follow Up Recommendations  SNF;Home health OT;Supervision/Assistance - 24 hour    Equipment Recommendations  3 in 1 bedside commode    Recommendations for Other Services       Precautions / Restrictions Precautions Precautions: Fall Precaution Comments: monitor SpO2 and RR Restrictions Weight Bearing Restrictions: No      Mobility Bed Mobility Overal bed mobility: Needs Assistance Bed Mobility: Supine to Sit     Supine to sit: Min guard     General bed mobility comments: increased time, HOB elevated, +SOB, RR 44, SpO2 >92% on 10L high flow O2  Transfers Overall transfer level: Needs  assistance Equipment used: Rolling walker (2 wheeled) Transfers: Sit to/from Stand Sit to Stand: Min assist         General transfer comment: v/c's for safe hand placement, increased time, minA to steady    Balance Overall balance assessment: Needs assistance Sitting-balance support: No upper extremity supported;Feet supported Sitting balance-Leahy Scale: Good     Standing balance support: Bilateral upper extremity supported Standing balance-Leahy Scale: Fair Standing balance comment: dependent on RW                           ADL either performed or assessed with clinical judgement   ADL Overall ADL's : Needs assistance/impaired                 Upper Body Dressing : Set up;Sitting Upper Body Dressing Details (indicate cue type and reason): To don posterior hospital gown seated EOB.      Toilet Transfer: Minimal Print production planner Details (indicate cue type and reason): BSC positioned near sink with cues for hand placement.  Toileting- Clothing Manipulation and Hygiene: Minimal assistance Toileting - Clothing Manipulation Details (indicate cue type and reason): Assist for hygiene management after BM     Functional mobility during ADLs: Minimal assistance;+2 for safety/equipment General ADL Comments: Patient air hungry with maximal cues for pursed lip breathing. With short distance ambulation to door of hospital room, patient desat to 72-80% on 10L via [place] with RR in 40's. With rest break, SpO2 increased to 87-88% at best. RN notified.      Vision   Vision Assessment?: No apparent visual deficits     Perception  Praxis      Pertinent Vitals/Pain Pain Assessment: No/denies pain     Hand Dominance Right   Extremity/Trunk Assessment Upper Extremity Assessment Upper Extremity Assessment: Generalized weakness   Lower Extremity Assessment Lower Extremity Assessment: Generalized weakness   Cervical / Trunk Assessment Cervical / Trunk  Assessment: Normal   Communication Communication Communication: No difficulties (does get SOB even with talking)   Cognition Arousal/Alertness: Awake/alert Behavior During Therapy: WFL for tasks assessed/performed Overall Cognitive Status: Within Functional Limits for tasks assessed                                     General Comments  RR up to 44 during mobility, SpO2 dec into 70s during mobility on 10Lo2 via high flow NW    Exercises     Shoulder Instructions      Home Living Family/patient expects to be discharged to:: Private residence Living Arrangements: Spouse/significant other Available Help at Discharge: Family;Available 24 hours/day (wife is elderly and can only provide supervision) Type of Home: House Home Access: Stairs to enter [employer] of Steps: 2 Entrance Stairs-Rails: None Home Layout: One level     Bathroom Shower/Tub: Occupational psychologist: Standard     Home Equipment: Cane - single point;Shower seat - built in;Walker - 2 wheels          Prior Functioning/Environment Level of Independence: Independent with assistive device(s)        Comments: has to start using cane and then RW occasionally, wife supervises bathing and dressing        OT Problem List: Cardiopulmonary status limiting activity;Decreased activity tolerance;Impaired balance (sitting and/or standing)      OT Treatment/Interventions: Self-care/ADL training;Therapeutic exercise;Energy conservation;DME and/or AE instruction;Therapeutic activities;Patient/family education;Balance training    OT Goals(Current goals can be found in the care plan section) Acute Rehab OT Goals Patient Stated Goal: To return home.  OT Goal Formulation: With patient Time For Goal Achievement: 02/02/20 Potential to Achieve Goals: Fair ADL Goals Pt Will Transfer to Toilet: with supervision;ambulating;bedside commode Pt Will Perform Toileting - Clothing Manipulation  and hygiene: with supervision;sitting/lateral leans;sit to/from stand Additional ADL Goal #1: Patient will recall and utilize 3 energy conservation techniques in prep for BADLs. Additional ADL Goal #2: Patient will maintain SpO2 >90% during BADLs in sitting/standing.  OT Frequency: Min 2X/week   Barriers to D/C: Decreased caregiver support          Co-evaluation PT/OT/SLP Co-Evaluation/Treatment: Yes Reason for Co-Treatment: Complexity of the patient's impairments (multi-system involvement) PT goals addressed during session: Mobility/safety with mobility OT goals addressed during session: ADL's and self-care      AM-PAC OT "6 Clicks" Daily Activity     Outcome Measure Help from another person eating meals?: None Help from another person taking care of personal grooming?: A Little Help from another person toileting, which includes using toliet, bedpan, or urinal?: A Little Help from another person bathing (including washing, rinsing, drying)?: A Little Help from another person to put on and taking off regular upper body clothing?: A Little Help from another person to put on and taking off regular lower body clothing?: A Little 6 Click Score: 19   End of Session Equipment Utilized During Treatment: Rolling walker;Oxygen (10L via [place])  Activity Tolerance: Patient tolerated treatment well Patient left: in chair;with call bell/phone within reach;with chair alarm set  OT Visit Diagnosis: Muscle weakness (generalized) (M62.81)  Time: 3143-8887 OT Time Calculation (min): 25 min Charges:  OT General Charges $OT Visit: 1 Visit OT Evaluation $OT Eval Moderate Complexity: 1 Mod  Edin Skarda H. OTR/L Supplemental OT, Department of rehab services [phone]  Joni Norrod R H. 01/19/2020, 10:59 AM

## 2020-01-19 NOTE — [place] FL2 (Signed)
[place] MEDICAID FL2 LEVEL OF CARE SCREENING TOOL     IDENTIFICATION  Patient Name: Jason Summers Birthdate: [birth year] Sex: male Admission Date (Current Location): 01/11/2020  [facility] and [place] Number:  Herbalist and Address:  [facility], [place] [address], [place], [place] [address]      Provider Number: [id number]  Attending Physician Name and Address:  Hosie Poisson, MD  Relative Name and Phone Number:       Current Level of Care: Hospital Recommended Level of Care: [place] Prior Approval Number:    Date Approved/Denied:   PASRR Number: [id number]  Discharge Plan: SNF    Current Diagnoses: Patient Active Problem List   Diagnosis Date Noted  . HTN (hypertension) 01/15/2020  . HLD (hyperlipidemia) 01/23/2020  . Depression with anxiety 01/12/2020  . Right middle lobe pneumonia 01/17/2020  . Chronic hypoxemic respiratory failure ([place]) 01/14/2020  . Elevated liver enzymes 02/07/2020  . Sepsis ([place]) 12/30/2019  . Pulmonary hypertension, unspecified ([place])   . Coordination of complex care 09/19/2019  . Compression fracture of body of thoracic vertebra ([place]) 02/09/2019  . IPF (idiopathic pulmonary fibrosis) ([place]) 07/14/2018  . Anxiety 07/14/2018  . Acute rhinitis 08/10/2017  . Eustachian tube dysfunction, bilateral 08/10/2017  . Persistent headaches 02/03/2017  . Chest pain in adult 12/16/2016  . Chronic right-sided thoracic back pain 12/16/2016  . Pleural effusion on left 08/14/2016  . Pulmonary emphysema ([place]) 08/14/2016  . Radiation fibrosis of lung ([place]) 08/14/2016  . Shortness of breath 08/14/2016  . History of esophageal stricture 05/15/2016  . Gastroesophageal reflux disease 05/15/2016  . Nausea and vomiting 05/15/2016  . Headache 01/30/2016  . Odynophagia 11/06/2015  . Encounter for antineoplastic chemotherapy 10/15/2015  . Bilateral epiphora 09/25/2015  . Nuclear sclerosis of right eye 09/25/2015  .  Pseudophakia, left eye 09/25/2015  . Squamous blepharitis of upper eyelids of both eyes 09/25/2015  . Non-small cell cancer of left lung ([place]) 09/24/2015  . Mediastinal adenopathy 09/07/2015  . Mass of lingula of lung 09/07/2015  . Multiple pulmonary nodules 08/27/2015  . Mediastinal lymphadenopathy 08/27/2015  . Osteoarthritis 07/17/2010  . Dysphagia 07/17/2010  . DYSPHAGIA, PHARYNGOESOPHAGEAL PHASE 05/07/2010  . HYPERCHOLESTEROLEMIA, MILD 09/06/2008  . SPRAIN AND STRAIN OF SACROILIAC 09/06/2008  . Acute stress disorder 10/27/2007  . Blush 09/23/2007  . Soft tissue lesion of shoulder region 04/22/2007  . GANGLION OF JOINT 04/22/2007  . HEMORRHOIDS, INTERNAL 02/18/2007    Orientation RESPIRATION BLADDER Height & Weight     Self, Time, Situation, Place  O2 ([place] 2L) Continent Weight: 157 lb 13.6 oz (71.6 kg) Height:  _0  (167.6 cm)  BEHAVIORAL SYMPTOMS/MOOD NEUROLOGICAL BOWEL NUTRITION STATUS      Continent Diet (regular)  AMBULATORY STATUS COMMUNICATION OF NEEDS Skin   Limited Assist Verbally Normal                       Personal Care Assistance Level of Assistance  Bathing, Dressing, Feeding Bathing Assistance: Limited assistance Feeding assistance: Independent Dressing Assistance: Limited assistance     Functional Limitations Info             SPECIAL CARE FACTORS FREQUENCY  PT (By licensed PT), OT (By licensed OT)     PT Frequency: 5x/wk OT Frequency: 5x/wk            Contractures Contractures Info: Not present    Additional Factors Info  Code Status, Allergies, Psychotropic Code Status Info: Full  Allergies Info: Gentamicin, Loteprednol-tobramycin Psychotropic Info: Celexa 35m daily         Current Medications (01/19/2020):  This is the current hospital active medication list Current Facility-Administered Medications  Medication Dose Route Frequency Provider Last Rate Last Admin  . acetaminophen (TYLENOL) tablet 650 mg  650 mg Oral Q6H PRN  Pahwani, Rinka R, MD       Or  . acetaminophen (TYLENOL) suppository 650 mg  650 mg Rectal Q6H PRN Pahwani, Rinka R, MD      . ALPRAZolam (Duanne Moron tablet 0.25 mg  0.25 mg Oral TID PRN AHosie Poisson MD   0.25 mg at 01/18/20 2103  . calcium carbonate (TUMS - dosed in mg elemental calcium) chewable tablet 200 mg of elemental calcium  1 tablet Oral BID PRN Pahwani, Rinka R, MD      . ceFEPIme (MAXIPIME) 2 g in sodium chloride 0.9 % 100 mL IVPB  2 g Intravenous Q12H Rumbarger, Rachel L, RPH 200 mL/hr at 01/19/20 1011 2 g at 01/19/20 1011  . citalopram (CELEXA) tablet 10 mg  10 mg Oral Daily Pahwani, Rinka R, MD   10 mg at 01/19/20 1002  . enoxaparin (LOVENOX) injection 40 mg  40 mg Subcutaneous Q24H Pahwani, Rinka R, MD   40 mg at 01/17/20 1749  . levalbuterol (XOPENEX) nebulizer solution 0.63 mg  0.63 mg Nebulization Q3H PRN DShearon Stalls Rahul P, PA-C   0.63 mg at 01/19/20 0803  . methylPREDNISolone sodium succinate (SOLU-MEDROL) 125 mg/2 mL injection 60 mg  60 mg Intravenous Q12H Pahwani, Rinka R, MD   60 mg at 01/19/20 1004  . metoprolol tartrate (LOPRESSOR) tablet 12.5 mg  12.5 mg Oral BID Pahwani, Rinka R, MD   12.5 mg at 01/19/20 1002  . Nintedanib (OFEV) CAPS 150 mg  150 mg Oral BID WC Pahwani, Rinka R, MD   150 mg at 01/19/20 0712  . ondansetron (ZOFRAN) tablet 4 mg  4 mg Oral Q6H PRN Pahwani, Rinka R, MD       Or  . ondansetron (ZOFRAN) injection 4 mg  4 mg Intravenous Q6H PRN Pahwani, Rinka R, MD      . oxyCODONE (Oxy IR/ROXICODONE) immediate release tablet 5 mg  5 mg Oral Q4H PRN Pahwani, Rinka R, MD   5 mg at 01/17/20 1750  . pantoprazole (PROTONIX) EC tablet 80 mg  80 mg Oral Q1200 Pahwani, Rinka R, MD   80 mg at 01/19/20 1004  . rosuvastatin (CRESTOR) tablet 5 mg  5 mg Oral QHS Pahwani, Rinka R, MD   5 mg at 01/18/20 2103  . umeclidinium bromide (INCRUSE ELLIPTA) 62.5 MCG/INH 1 puff  1 puff Inhalation Daily Pahwani, Rinka R, MD   1 puff at 01/19/20 0759     Discharge Medications: Please see  discharge summary for a list of discharge medications.  Relevant Imaging Results:  Relevant Lab Results:   Additional Information SS#: [id number] EGeralynn Ochs LCSW

## 2020-01-19 NOTE — Progress Notes (Signed)
Pharmacy Antibiotic Note  Jason Summers is a 86 y.o. male admitted on 01/09/2020 with pneumonia.  Pharmacy managing Cefepime dosing. Vancomycin d/c'ed for negative MRSA PCR. On D#4 of abx. WBC wnl and remains afebrile.   Plan: Continue Cefepime 2 gm IV Q 12 hours F/u abx LOT   Height: _0  (167.6 cm) Weight: 71.6 kg (157 lb 13.6 oz) IBW/kg (Calculated) : 63.8  Temp (24hrs), Avg:97.8 F (36.6 C), Min:97.6 F (36.4 C), Max:97.9 F (36.6 C)  Recent Labs  Lab 01/14/2020 0953 01/26/2020 1305 02/02/2020 1312 01/17/2020 1748 01/17/20 0125  WBC 8.7  --  7.8  --  5.9  CREATININE 0.90  --  0.89  --  1.17  LATICACIDVEN  --  1.3  --  1.9  --     Estimated Creatinine Clearance: 40.9 mL/min (by C-G formula based on SCr of 1.17 mg/dL).    Allergies  Allergen Reactions   Gentamicin Other (See Comments)    Made eyes red, swollen, hot feeling   Loteprednol-Tobramycin Other (See Comments)    Made eyes red, swollen, hot feeling    Antimicrobials this admission: Vanc 8/9>>8/11 Cefepime x 1 8/9  Dose adjustments this admission: N/A  Microbiology results: 8/9 BCx2>> ngtd  8/9 COVID neg > neg  8/9 Strep pneumo/legionella Ag >> neg 8/11 MRSA PCR >> neg   Thank you for allowing pharmacy to be a part of this patients care.  Albertina Parr, PharmD., BCPS, BCCCP Clinical Pharmacist Clinical phone for 01/19/20 until 3:30pm: [phone] If after 3:30pm, please refer to [facility] for unit-specific pharmacist

## 2020-01-19 NOTE — Progress Notes (Signed)
During the night, the patient go up with assistance to the bedside commode.  Getting up to the side of the bed and onto the commode, made the patient tachypenic, in the 40s.  While on the High-flow nasal cannula at 5 L O2, his O2 sats dropped to the 70s.  Nurse increased the oxygen to 10L and instructed the patient to try to breath in through his nose.  Gradually, his O2 sats went up to about 96%.  Decided to leave him on 10L O2 for a while.  Will continue to monitor pt.  Lupita Dawn, RN

## 2020-01-19 NOTE — Progress Notes (Signed)
PROGRESS NOTE    Jason Summers  WYO:[id number] DOB: 07/26/1931 DOA: 01/17/2020 PCP: [name], NP   Chief Complaint  Patient presents with  . Respiratory Distress    Brief Narrative:  86 year old male prior history of lung cancer in remission, s/p chemo and radiation therapy, idiopathic pulmonary fibrosis on chronic steroids, sinus tachycardia, hyperlipidemia, depression, anxiety, GERD presents to ED with worsening shortness of breath.  He was admitted for IPF exacerbation and community-acquired pneumonia from 7/21/202127/28/2021 and discharged home in stable condition on doxycycline.  But presents to ED with progression of shortness of breath.  Currently he is requiring up to 5 L of nasal cannula oxygen to keep sats greater than 90%.  Due to persistent hypoxia and increased oxygen requirement PCCM consulted for recommendations.  Overnight pt had hypoxia and required upto 10 lit of HF oxygen to keep sats greater than 90%   Assessment & Plan:   Principal Problem:   Right middle lobe pneumonia Active Problems:   Gastroesophageal reflux disease   IPF (idiopathic pulmonary fibrosis) (HCC)   HTN (hypertension)   HLD (hyperlipidemia)   Depression with anxiety   Chronic hypoxemic respiratory failure (HCC)   Elevated liver enzymes  Acute on chronic respiratory failure secondary to hospital-acquired pneumonia and IPF flareup  Continue cefepime and PCCM added IV Solu-Medrol for the management of IPF flareup.continue the same, no changes in meds.  strep pneumonia and Legionella antigens are negative at this time. Continue with duo nebs. Overnight patient had respiratory distress and oxygen had to be increased to 10 lit to keep sats greater than 90%.   Elevated liver enzymes Acute viral hepatitis panel is negative. Appears to be improving at this time.   Anxiety As needed Xanax.   Depression Continue Celexa.   GERD PPI   Essential hypertension Blood pressure parameters  well controlled. No changes in meds.     DVT prophylaxis: (Lovenox) Code Status: Full code Family Communication: Family at bedside Disposition:   Status is: Inpatient  Remains inpatient appropriate because:IV treatments appropriate due to intensity of illness or inability to take PO   Dispo: The patient is from: Home              Anticipated d/c is to: pending.               Anticipated d/c date is: > 3 days              Patient currently is not medically stable to d/c.       Consultants:   PCCM.    Procedures: none.    Antimicrobials: Anti-infectives (From admission, onward)   Start     Dose/Rate Route Frequency Ordered Stop   01/17/20 0000  vancomycin (VANCOREADY) IVPB 750 mg/150 mL  Status:  Discontinued        750 mg 150 mL/hr over 60 Minutes Intravenous Every 12 hours 01/17/2020 1119 01/18/20 1436   01/11/2020 2200  ceFEPIme (MAXIPIME) 2 g in sodium chloride 0.9 % 100 mL IVPB     Discontinue     2 g 200 mL/hr over 30 Minutes Intravenous Every 12 hours 01/25/2020 1342     02/04/2020 1030  ceFEPIme (MAXIPIME) 2 g in sodium chloride 0.9 % 100 mL IVPB        2 g 200 mL/hr over 30 Minutes Intravenous  Once 01/23/2020 1016 01/10/2020 1232   01/12/2020 1030  vancomycin (VANCOREADY) IVPB 1500 mg/300 mL        1,500 mg 150 mL/hr  over 120 Minutes Intravenous  Once 01/11/2020 1017 01/10/2020 1441         Subjective: Pt reports sob improved.  Xanax slightly increased.  No cough. No nausea or vomiting.   Objective: Vitals:   01/19/20 0803 01/19/20 0804 01/19/20 1336 01/19/20 1553  BP:   117/75 126/82  Pulse:   77 72  Resp:   19 18  Temp:   97.7 F (36.5 C) 97.9 F (36.6 C)  TempSrc:   Tympanic Oral  SpO2: 90% 91% 97% 96%  Weight:      Height:        Intake/Output Summary (Last 24 hours) at 01/19/2020 1700 Last data filed at 01/19/2020 1500 Gross per 24 hour  Intake 690 ml  Output 825 ml  Net -135 ml   Filed Weights   01/17/20 0323 01/18/20 0335 01/19/20 0324    Weight: 72.6 kg 73.8 kg 71.6 kg    Examination:  General exam: not in distress on 8 lit of [place] oxygen.  Respiratory system: scattered wheezing posteriorly, air entry fair.  Cardiovascular system: S1S2 RRR, no JVD.  Gastrointestinal system: Abdomen is SOFT, NT, ND BS+ Central nervous system: ALERT and oriented to place and person, grossly non focal  Extremities: no cyanosis or clubbing.  Skin: No rashes.  Psychiatry: Mood is appropriate.     Data Reviewed: I have personally reviewed following labs and imaging studies  CBC: Recent Labs  Lab 01/08/2020 0953 01/09/2020 1248 01/27/2020 1312 01/17/20 0125  WBC 8.7  --  7.8 5.9  NEUTROABS 7.4  --   --   --   HGB 14.1 13.3 13.4 12.9*  HCT 44.9 39.0 42.4 40.3  MCV 92.2  --  92.4 91.6  PLT 179  --  168 944    Basic Metabolic Panel: Recent Labs  Lab 01/18/2020 0953 01/22/2020 1248 02/03/2020 1312 01/17/20 0125  NA 139 136  --  136  K 3.9 3.7  --  4.8  CL 95*  --   --  97*  CO2 31  --   --  29  GLUCOSE 95  --   --  144*  BUN 13  --   --  20  CREATININE 0.90  --  0.89 1.17  CALCIUM 8.7*  --   --  8.3*  MG  --   --  2.1  --   PHOS  --   --  2.3*  --     GFR: Estimated Creatinine Clearance: 40.9 mL/min (by C-G formula based on SCr of 1.17 mg/dL).  Liver Function Tests: Recent Labs  Lab 01/10/2020 0953 01/17/20 0125  AST 109* 62*  ALT 144* 116*  ALKPHOS 73 73  BILITOT 0.8 1.1  PROT 6.2* 5.6*  ALBUMIN 2.7* 2.4*    CBG: No results for input(s): GLUCAP in the last 168 hours.   Recent Results (from the past 240 hour(s))  SARS Coronavirus 2 by RT PCR (hospital order, performed in [facility] hospital lab) Nasopharyngeal Nasopharyngeal Swab     Status: None   Collection Time: 02/01/2020  9:54 AM   Specimen: Nasopharyngeal Swab  Result Value Ref Range Status   SARS Coronavirus 2 NEGATIVE NEGATIVE Final    Comment: (NOTE) SARS-CoV-2 target nucleic acids are NOT DETECTED.  The SARS-CoV-2 RNA is generally detectable in upper  and lower respiratory specimens during the acute phase of infection. The lowest concentration of SARS-CoV-2 viral copies this assay can detect is 250 copies / mL. A negative result does not  preclude SARS-CoV-2 infection and should not be used as the sole basis for treatment or other patient management decisions.  A negative result may occur with improper specimen collection / handling, submission of specimen other than nasopharyngeal swab, presence of viral mutation(s) within the areas targeted by this assay, and inadequate number of viral copies (<250 copies / mL). A negative result must be combined with clinical observations, patient history, and epidemiological information.  Fact Sheet for Patients:   StrictlyIdeas.no  Fact Sheet for Healthcare Providers: BankingDealers.co.za  This test is not yet approved or  cleared by the [place] FDA and has been authorized for detection and/or diagnosis of SARS-CoV-2 by FDA under an Emergency Use Authorization (EUA).  This EUA will remain in effect (meaning this test can be used) for the duration of the COVID-19 declaration under Section 564(b)(1) of the Act, 21 U.S.C. section 360bbb-3(b)(1), unless the authorization is terminated or revoked sooner.  Performed at [facility], [place] [address] [place], [place] [address]   Culture, blood (routine x 2)     Status: None (Preliminary result)   Collection Time: 02/01/2020 12:45 PM   Specimen: BLOOD RIGHT ARM  Result Value Ref Range Status   Specimen Description BLOOD RIGHT ARM  Final   Special Requests   Final    BOTTLES DRAWN AEROBIC AND ANAEROBIC Blood Culture adequate volume   Culture   Final    NO GROWTH 3 DAYS Performed at [facility], [place] [address], [place], [place] [address]    Report Status PENDING  Incomplete  Culture, blood (routine x 2)     Status: None (Preliminary result)   Collection Time: 02/04/2020  1:00 PM    Specimen: BLOOD RIGHT HAND  Result Value Ref Range Status   Specimen Description BLOOD RIGHT HAND  Final   Special Requests   Final    BOTTLES DRAWN AEROBIC AND ANAEROBIC Blood Culture adequate volume   Culture   Final    NO GROWTH 3 DAYS Performed at [facility], [place] [address], [place],  [address]    Report Status PENDING  Incomplete  MRSA PCR Screening     Status: None   Collection Time: 01/18/20 10:32 AM   Specimen: Nasal Mucosa; Nasopharyngeal  Result Value Ref Range Status   MRSA by PCR NEGATIVE NEGATIVE Final    Comment:        The GeneXpert MRSA Assay (FDA approved for NASAL specimens only), is one component of a comprehensive MRSA colonization surveillance program. It is not intended to diagnose MRSA infection nor to guide or monitor treatment for MRSA infections. Performed at [facility], [place] [address], [place],  [address]          Radiology Studies: No results found.      Scheduled Meds: . citalopram  10 mg Oral Daily  . enoxaparin (LOVENOX) injection  40 mg Subcutaneous Q24H  . methylPREDNISolone (SOLU-MEDROL) injection  60 mg Intravenous Q12H  . metoprolol tartrate  12.5 mg Oral BID  . Nintedanib  150 mg Oral BID WC  . pantoprazole  80 mg Oral Q1200  . rosuvastatin  5 mg Oral QHS  . umeclidinium bromide  1 puff Inhalation Daily   Continuous Infusions: . ceFEPime (MAXIPIME) IV 2 g (01/19/20 1011)     LOS: 3 days       Hosie Poisson, MD [facility]   To contact the attending provider between 7A-7P or the covering provider during after  hours 7P-7A, please log into the web site [email or web address] and access using universal [place] password for that web site. If you do not have the password, please call the hospital operator.  01/19/2020, 5:00 PM

## 2020-01-19 NOTE — Evaluation (Signed)
Physical Therapy Evaluation Patient Details Name: Jason Summers MRN: [id number] DOB: [birth year] Today's Date: 01/19/2020   History of Present Illness  Jason Summers is a 86 y.o. male with medical history significant of lung cancer-in remission, post chemo and radiation therapy, idiopathic pulmonary fibrosis on chronic steroids andNintendanib, sinus tachycardia, hyperlipidemia, depression/anxiety, GERD presents to emergency department with worsening shortness of breath since couple of days.    Clinical Impression  Pt admitted with above. Pt very pleasant and motivated to work with therapy despite increased SOB and RR. Pt on 10 Lo2 via high flow [place] and drops into 70s with short ambulation and transfer to [facility]. Pt functioning at [place] level currently and wife can only provide supervision level of care. Unsure of patient prognosis and recommend palliative care consult if hospice may be an option. Pt would like to go home but unsure if his wife can care for him anymore. Acute PT to cont to follow, assess d/c recommendations, and progress mobility per plan of care and pt desire.    Follow Up Recommendations SNF;Supervision/Assistance - 24 hour (unless family can provide 24/7 with hired help)    Equipment Recommendations  None recommended by PT    Recommendations for Other Services Other (comment) (palliative consult)     Precautions / Restrictions Precautions Precautions: Fall Precaution Comments: monitor SpO2 and RR Restrictions Weight Bearing Restrictions: No      Mobility  Bed Mobility Overal bed mobility: Needs Assistance Bed Mobility: Supine to Sit     Supine to sit: Min guard     General bed mobility comments: increased time, HOB elevated, +SOB, RR 44, SpO2 >92% on 10L high flow O2  Transfers Overall transfer level: Needs assistance Equipment used: Rolling walker (2 wheeled) Transfers: Sit to/from Stand Sit to Stand: Min assist         General transfer comment: v/c's for  safe hand placement, increased time, minA to steady  Ambulation/Gait Ambulation/Gait assistance: Min assist;+2 safety/equipment Gait Distance (Feet): 10 Feet Assistive device: Rolling walker (2 wheeled) Gait Pattern/deviations: Step-through pattern;Decreased stride length Gait velocity: decreased   General Gait Details: pt SpO2 dec to 72% on 10Lo2 via [place], asked to go on BSC, vc's for purse lipped breathing and deep breaths through nose  Stairs            Wheelchair Mobility    Modified Rankin (Stroke Patients Only)       Balance Overall balance assessment: Needs assistance Sitting-balance support: No upper extremity supported;Feet supported Sitting balance-Leahy Scale: Good     Standing balance support: Bilateral upper extremity supported Standing balance-Leahy Scale: Fair Standing balance comment: dependent on RW                             Pertinent Vitals/Pain Pain Assessment: No/denies pain    Home Living Family/patient expects to be discharged to:: Private residence Living Arrangements: Spouse/significant other Available Help at Discharge: Family;Available 24 hours/day (wife is elderly and can only provide supervision) Type of Home: House Home Access: Stairs to enter Entrance Stairs-Rails: None Entrance Stairs-Number of Steps: 2 Home Layout: One level Home Equipment: Cane - single point;Shower seat - built in;Walker - 2 wheels      Prior Function Level of Independence: Independent with assistive device(s)         Comments: has to start using cane and then RW occasionally, wife supervises bathing and dressing     Hand Dominance   Dominant Hand: Right  Extremity/Trunk Assessment   Upper Extremity Assessment Upper Extremity Assessment: Generalized weakness    Lower Extremity Assessment Lower Extremity Assessment: Generalized weakness    Cervical / Trunk Assessment Cervical / Trunk Assessment: Normal  Communication    Communication: No difficulties (does get SOB even with talking)  Cognition Arousal/Alertness: Awake/alert Behavior During Therapy: WFL for tasks assessed/performed Overall Cognitive Status: Within Functional Limits for tasks assessed                                        General Comments General comments (skin integrity, edema, etc.): RR up to 44 during mobility, SpO2 dec into 70s during mobility on 10Lo2 via high flow NW    Exercises     Assessment/Plan    PT Assessment Patient needs continued PT services  PT Problem List Decreased strength;Decreased range of motion;Decreased activity tolerance;Decreased balance;Decreased mobility;Decreased knowledge of use of DME       PT Treatment Interventions DME instruction;Gait training;Stair training;Functional mobility training;Therapeutic activities;Therapeutic exercise;Balance training;Patient/family education    PT Goals (Current goals can be found in the Care Plan section)  Acute Rehab PT Goals Patient Stated Goal: breath better PT Goal Formulation: With patient Time For Goal Achievement: 02/02/20 Potential to Achieve Goals: Fair    Frequency Min 3X/week   Barriers to discharge Decreased caregiver support wife can only provided supervision    Co-evaluation PT/OT/SLP Co-Evaluation/Treatment: Yes Reason for Co-Treatment: (P) Complexity of the patient's impairments (multi-system involvement) PT goals addressed during session: Mobility/safety with mobility         AM-PAC PT "6 Clicks" Mobility  Outcome Measure Help needed turning from your back to your side while in a flat bed without using bedrails?: A Little Help needed moving from lying on your back to sitting on the side of a flat bed without using bedrails?: A Little Help needed moving to and from a bed to a chair (including a wheelchair)?: A Little Help needed standing up from a chair using your arms (e.g., wheelchair or bedside chair)?: A Little Help  needed to walk in hospital room?: A Little Help needed climbing 3-5 steps with a railing? : A Lot 6 Click Score: 17    End of Session Equipment Utilized During Treatment: Oxygen;Gait belt Activity Tolerance: Other (comment) (limited by SOB) Patient left: in chair;with call bell/phone within reach;with chair alarm set;with nursing/sitter in room Nurse Communication: Mobility status PT Visit Diagnosis: Difficulty in walking, not elsewhere classified (R26.2)    Time: 0630-1601 PT Time Calculation (min) (ACUTE ONLY): 37 min   Charges:   PT Evaluation $PT Eval Moderate Complexity: 1 Mod PT Treatments $Gait Training: 8-22 mins        Kittie Plater, PT, DPT Acute Rehabilitation Services Pager #: [phone] Office #: [phone]   Berline Lopes 01/19/2020, 10:48 AM

## 2020-01-19 NOTE — TOC Initial Note (Signed)
Transition of Care [facility]) - Initial/Assessment Note    Patient Details  Name: Jason Summers MRN: [id number] Date of Birth: [birth year]  Transition of Care [facility]) CM/SW Contact:    Vinie Sill, [place] Phone Number: 01/19/2020, 3:45 PM  Clinical Narrative:        CSW visit with patient at bedside ,along with is wife. CSW introduced self and explained role. CSW discussed short term rehab at [facility]. Patient states he wants to go home but he and his wife was willing to explore the idea and asked appropriate questions. CSW explained the SNF process. Patient's wife called their son, Jason Summers, placed on speaker phone so he too can hear about the SNF process. Patient, spouse and their son was agreeable to begin the SNF search process. CSW gave patient's wife medicare.gov SNF listing for choice. Family was appreciative of CSW assistance.  CSW will continue to follow and assist with discharge planning.  Thurmond Butts, MSW, [place] Clinical Social Worker    Expected Discharge Plan: Skilled Nursing Facility Barriers to Discharge: Ship broker, Continued Medical Work up, SNF Pending bed offer   Patient Goals and CMS Choice        Expected Discharge Plan and Services Expected Discharge Plan: [place] In-house Referral: Clinical Social Work                                            Prior Living Arrangements/Services   Lives with:: Self, Spouse Patient language and need for interpreter reviewed:: No        Need for Family Participation in Patient Care: Yes (Comment) Care giver support system in place?: Yes (comment)   Criminal Activity/Legal Involvement Pertinent to Current Situation/Hospitalization: No - Comment as needed  Activities of Daily Living      Permission Sought/Granted Permission sought to share information with : Family Supports Permission granted to share information with : Yes, Verbal Permission Granted  Share Information with  NAME: Jason Summers  Permission granted to share info w AGENCY: SNF  Permission granted to share info w Relationship: spouse  Permission granted to share info w Contact Information: [phone]  Emotional Assessment Appearance:: Appears stated age Attitude/Demeanor/Rapport: Engaged Affect (typically observed): Accepting, Pleasant, Appropriate Orientation: : Oriented to Self, Oriented to Place, Oriented to  Time, Oriented to Situation Alcohol / Substance Use: Not Applicable Psych Involvement: No (comment)  Admission diagnosis:  Elevated liver enzymes [R74.8] Right middle lobe pneumonia [J18.9] HCAP (healthcare-associated pneumonia) [J18.9] Patient Active Problem List   Diagnosis Date Noted  . HTN (hypertension) 02/05/2020  . HLD (hyperlipidemia) 01/13/2020  . Depression with anxiety 01/29/2020  . Right middle lobe pneumonia 01/18/2020  . Chronic hypoxemic respiratory failure ([place]) 01/13/2020  . Elevated liver enzymes 01/20/2020  . Sepsis ([place]) 12/30/2019  . Pulmonary hypertension, unspecified ([place])   . Coordination of complex care 09/19/2019  . Compression fracture of body of thoracic vertebra ([place]) 02/09/2019  . IPF (idiopathic pulmonary fibrosis) ([place]) 07/14/2018  . Anxiety 07/14/2018  . Acute rhinitis 08/10/2017  . Eustachian tube dysfunction, bilateral 08/10/2017  . Persistent headaches 02/03/2017  . Chest pain in adult 12/16/2016  . Chronic right-sided thoracic back pain 12/16/2016  . Pleural effusion on left 08/14/2016  . Pulmonary emphysema ([place]) 08/14/2016  . Radiation fibrosis of lung ([place]) 08/14/2016  . Shortness of breath 08/14/2016  . History of esophageal stricture 05/15/2016  . Gastroesophageal reflux  disease 05/15/2016  . Nausea and vomiting 05/15/2016  . Headache 01/30/2016  . Odynophagia 11/06/2015  . Encounter for antineoplastic chemotherapy 10/15/2015  . Bilateral epiphora 09/25/2015  . Nuclear sclerosis of right eye 09/25/2015  . Pseudophakia, left eye  09/25/2015  . Squamous blepharitis of upper eyelids of both eyes 09/25/2015  . Non-small cell cancer of left lung ([place]) 09/24/2015  . Mediastinal adenopathy 09/07/2015  . Mass of lingula of lung 09/07/2015  . Multiple pulmonary nodules 08/27/2015  . Mediastinal lymphadenopathy 08/27/2015  . Osteoarthritis 07/17/2010  . Dysphagia 07/17/2010  . DYSPHAGIA, PHARYNGOESOPHAGEAL PHASE 05/07/2010  . HYPERCHOLESTEROLEMIA, MILD 09/06/2008  . SPRAIN AND STRAIN OF SACROILIAC 09/06/2008  . Acute stress disorder 10/27/2007  . Blush 09/23/2007  . Soft tissue lesion of shoulder region 04/22/2007  . GANGLION OF JOINT 04/22/2007  . HEMORRHOIDS, INTERNAL 02/18/2007   PCP:  [name], NP Pharmacy:   [facility], [place] [address] Phone: [phone] Fax: [phone]  [facility] [place], [place] [address] [place] [address] Phone: [phone] Fax: [place], [place] [address] [place] [address] Phone: [phone] Fax: [place] [facility] [address], [place] - [address] [place] [address] Phone: [phone] Fax: [phone]     Social Determinants of Health (SDOH) Interventions    Readmission Risk Interventions No flowsheet data found.

## 2020-01-20 DIAGNOSIS — J9601 Acute respiratory failure with hypoxia: Secondary | ICD-10-CM

## 2020-01-20 DIAGNOSIS — J84112 Idiopathic pulmonary fibrosis: Secondary | ICD-10-CM | POA: Diagnosis not present

## 2020-01-20 DIAGNOSIS — Z515 Encounter for palliative care: Secondary | ICD-10-CM | POA: Diagnosis not present

## 2020-01-20 DIAGNOSIS — J189 Pneumonia, unspecified organism: Secondary | ICD-10-CM | POA: Diagnosis not present

## 2020-01-20 DIAGNOSIS — Z7189 Other specified counseling: Secondary | ICD-10-CM | POA: Diagnosis not present

## 2020-01-20 DIAGNOSIS — Z66 Do not resuscitate: Secondary | ICD-10-CM

## 2020-01-20 DIAGNOSIS — R0600 Dyspnea, unspecified: Secondary | ICD-10-CM

## 2020-01-20 DIAGNOSIS — R748 Abnormal levels of other serum enzymes: Secondary | ICD-10-CM | POA: Diagnosis not present

## 2020-01-20 DIAGNOSIS — F418 Other specified anxiety disorders: Secondary | ICD-10-CM | POA: Diagnosis not present

## 2020-01-20 DIAGNOSIS — R0609 Other forms of dyspnea: Secondary | ICD-10-CM

## 2020-01-20 DIAGNOSIS — J9611 Chronic respiratory failure with hypoxia: Secondary | ICD-10-CM | POA: Diagnosis not present

## 2020-01-20 LAB — CBC WITH DIFFERENTIAL/PLATELET
Abs Immature Granulocytes: 0.06 10*3/uL (ref 0.00–0.07)
Basophils Absolute: 0 10*3/uL (ref 0.0–0.1)
Basophils Relative: 0 %
Eosinophils Absolute: 0 10*3/uL (ref 0.0–0.5)
Eosinophils Relative: 0 %
HCT: 41.7 % (ref 39.0–52.0)
Hemoglobin: 13.3 g/dL (ref 13.0–17.0)
Immature Granulocytes: 1 %
Lymphocytes Relative: 3 %
Lymphs Abs: 0.3 10*3/uL — ABNORMAL LOW (ref 0.7–4.0)
MCH: 28.5 pg (ref 26.0–34.0)
MCHC: 31.9 g/dL (ref 30.0–36.0)
MCV: 89.3 fL (ref 80.0–100.0)
Monocytes Absolute: 0.4 10*3/uL (ref 0.1–1.0)
Monocytes Relative: 4 %
Neutro Abs: 8.5 10*3/uL — ABNORMAL HIGH (ref 1.7–7.7)
Neutrophils Relative %: 92 %
Platelets: 189 10*3/uL (ref 150–400)
RBC: 4.67 MIL/uL (ref 4.22–5.81)
RDW: 13.5 % (ref 11.5–15.5)
WBC: 9.2 10*3/uL (ref 4.0–10.5)
nRBC: 0 % (ref 0.0–0.2)

## 2020-01-20 LAB — BASIC METABOLIC PANEL
Anion gap: 9 (ref 5–15)
BUN: 21 mg/dL (ref 8–23)
CO2: 31 mmol/L (ref 22–32)
Calcium: 8.6 mg/dL — ABNORMAL LOW (ref 8.9–10.3)
Chloride: 96 mmol/L — ABNORMAL LOW (ref 98–111)
Creatinine, Ser: 0.81 mg/dL (ref 0.61–1.24)
GFR calc Af Amer: >60 mL/min (ref >60)
GFR calc non Af Amer: >60 mL/min (ref >60)
Glucose, Bld: 172 mg/dL — ABNORMAL HIGH (ref 70–99)
Potassium: 3.9 mmol/L (ref 3.5–5.1)
Sodium: 136 mmol/L (ref 135–145)

## 2020-01-20 MED ORDER — MORPHINE SULFATE 10 MG/5ML PO SOLN
2.5000 mg | ORAL | Status: DC | PRN
  Administered 2020-01-23 – 2020-01-28 (×6): 2.5 mg via ORAL
  Filled 2020-01-20 (×6): qty 2

## 2020-01-20 NOTE — Consult Note (Signed)
Palliative Medicine Inpatient Consult Note  Reason for consult:  Goals of Care  HPI:  Per intake H&P --> 86 year old male prior history of lung cancer in remission, s/p chemo and radiation therapy, idiopathic pulmonary fibrosis on chronic steroids, sinus tachycardia, hyperlipidemia, depression, anxiety, GERD presents to ED with worsening shortness of breath.  He was admitted for IPF exacerbation and community-acquired pneumonia from 7/21/202127/28/2021 and discharged home in stable condition on doxycycline.  But presents to ED with progression of shortness of breath.  Currently he is requiring up to 5 L of nasal cannula oxygen to keep sats greater than 90%.    Palliative care was asked to aid in goals of care conversations.   Clinical Assessment/Goals of Care: I have reviewed medical records including EPIC notes, labs and imaging, received report from bedside RN - eating 50-75% of meals, assessed the patient who was lying in bed on 9LPM HFNC.    I met with Jason Summers to further discuss diagnosis prognosis, GOC, EOL wishes, disposition and options.   I introduced Palliative Medicine as specialized medical care for people living with serious illness. It focuses on providing relief from the symptoms and stress of a serious illness. The goal is to improve quality of life for both the patient and the family.  I asked Jason Summers to tell me about himself. He shares that he is from [place] originally though [place] lived in [place]. He initially moved to [place] in [place] as he felt the [place] had "better living" than where he was from. He moved to [place] twenty years ago as his son had bought land and built a house here. 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 He is married and has been with his wife, Jason Summers for the past 35 three years. They share two children, a son who lives locally and a daughter who lives in [place]. He does consider himself a man of faith and  practices Catholicism.  At home, Jason Summers was doing fairly until his hospital admission on 7/21. Prior to this he has able to perform all bADLs with the exception of cooking which his wife did. He notes that one day he was getting out the the shower and he was unable to breath. He states that he knew at this point that he would require additional medical care promoting EMS. Since then he has a h/o of IPF, COPD, and recurrent PNA (CAP - Now HAP) he has been progressively having worsening dypnea. We discussed his present state. He shares that overall his goals are to get stronger and go back home.   A detailed discussion was had today regarding advanced directives, patient does not have these one file though her verbally tells me that he would rely on his wife, Jason Summers to make decisions for him if he were unable to make them for himself.    Concepts specific to code status, artifical feeding and hydration, continued IV antibiotics and rehospitalization was had.    I completed a MOST form today. The patient and family outlined their wishes for the following treatment decisions:  Cardiopulmonary Resuscitation: Do Not Attempt Resuscitation (DNR/No CPR)  Medical Interventions: Limited Additional Interventions: Use medical treatment, IV fluids and cardiac monitoring as indicated, DO NOT USE intubation or mechanical ventilation. May consider use of less invasive airway support such as BiPAP or CPAP. Also provide comfort measures. Transfer to the hospital if indicated. Avoid intensive care.   Antibiotics: Antibiotics if indicated  IV Fluids: IV fluids if indicated  Feeding Tube: No feeding tube   The difference between a aggressive medical intervention path  and a palliative comfort care path for this patient at this time was had. Patient states that he gets joy out of being in his home and spending time with his family.   Discussed the importance of continued conversation with family and their  medical  providers regarding overall plan of care and treatment options, ensuring decisions are within the context of the patients values and GOCs.  Addendum: I was able to call patients wife, Jason Summers this morning. I introduced Palliative medicine concepts and alerted her to the reasons why we are involved in the patients case. We spoke about Jason Summers present state of illness in the setting of his now recurrent hospitalizations and ongoing dypnea which is multifactorial in origin. We talked about his h/o lung CA, IPF, COPD, and recurrent PNA's. We discussed how at the present time the medical team is trying to optimize his treatment though his condition is quite guarded. I shared that the patients hope for himself is to improve though I am worried as it stands in terms of the level of oxygen he is requiring and how significant his desaturation episodes are with movement. We talked about the progressive nature of his lung diseases.  Jason Summers that she would not be able to have [name] come home. She feels that his present state is too weak and she too has health ailments she is contending with. Jason Summers shares that her daughter is flying in from [place] this weekend. Her son who lives locally is updated on Jason Summers's prognosis daily. They plan to get together as a family to talk about the care that will be needed moving forward. I Summers that I'd like to help aid in these conversations moving forward.  I did broach the topic of hospice in the event that Jason Summers's condition does not improve despite aggressive efforts as an option for him. I shared that at home this would not enable 24/7 care. I will alert the SW team to see if there are any additional resources to help them.   Decision Maker:  SUMMARY OF RECOMMENDATIONS   DNAR/DNI  MOST Completed, paper copy placed onto the chart    DNR Form Completed, paper copy placed onto the chart    TOC --> Information on CG services  Ongoing PMT support, patient has a guarded  prognosis presently in the setting of his lung disease and recurrent PNA's. Plan to speak with patients other family members to determine the best plan moving forward. Patients wife is unable to address his complex healthcare needs in their home.  Code Status/Advance Care Planning: DNAR/DNI   Symptom Management:  Dyspnea:  - Will add morphine 2.5mg Elixer Q4H PRN   Palliative Prophylaxis:   Oral care, mobility  Additional Recommendations (Limitations, Scope, Preferences):  Treat what is treatable   Psycho-social/Spiritual:   Desire for further Chaplaincy support: Yes - Catholic  Additional Recommendations: Education on disease progression   Prognosis: Guarded in the setting of recurrent hospitalizations  Discharge Planning: Discharge plan unclear, if stabilizes could potentially discharge to SNF  PPS:    This conversation/these recommendations were discussed with patient primary care team, Dr. Akula  Time In: 1040 Time Out: 1210 Total Time: 90 Greater than 50%  of this time was spent counseling and coordinating care related to the above assessment and plan.    [place] Palliative Medicine Team Team Cell Phone:   [phone] Please utilize secure chat with additional questions, if there is no response within 30 minutes please call the above phone number  Palliative Medicine Team providers are available by phone from 7am to 7pm daily and can be reached through the team cell phone.  Should this patient require assistance outside of these hours, please call the patient's attending physician.

## 2020-01-20 NOTE — Progress Notes (Signed)
NAME:  Jason Summers, MRN:  [id number], DOB:  August 10, 1931, LOS: 4 ADMISSION DATE:  01/29/2020, CONSULTATION DATE:  01/17/20 REFERRING MD:  Doristine Bosworth  CHIEF COMPLAINT:  Dyspnea   Brief History   Jason Summers is a 86 y.o. male who was admitted 8/9 with acute on chronic hypoxic respiratory failure 2/2 IPF flare and possible HCAP.  Past Medical History  has HYPERCHOLESTEROLEMIA, MILD; Acute stress disorder; HEMORRHOIDS, INTERNAL; Osteoarthritis; Soft tissue lesion of shoulder region; GANGLION OF JOINT; Blush; DYSPHAGIA, PHARYNGOESOPHAGEAL PHASE; SPRAIN AND STRAIN OF SACROILIAC; Dysphagia; Multiple pulmonary nodules; Mediastinal lymphadenopathy; Mediastinal adenopathy; Mass of lingula of lung; Non-small cell cancer of left lung ([place]); Encounter for antineoplastic chemotherapy; Odynophagia; Headache; History of esophageal stricture; Gastroesophageal reflux disease; Nausea and vomiting; Pleural effusion on left; Pulmonary emphysema ([place]); Radiation fibrosis of lung ([place]); Shortness of breath; Chest pain in adult; Chronic right-sided thoracic back pain; Persistent headaches; IPF (idiopathic pulmonary fibrosis) ([place]); Anxiety; Acute rhinitis; Bilateral epiphora; Eustachian tube dysfunction, bilateral; Nuclear sclerosis of right eye; Pseudophakia, left eye; Squamous blepharitis of upper eyelids of both eyes; Compression fracture of body of thoracic vertebra ([place]); Coordination of complex care; Pulmonary hypertension, unspecified ([place]); Sepsis ([place]); HTN (hypertension); HLD (hyperlipidemia); Depression with anxiety; Right middle lobe pneumonia; Chronic hypoxemic respiratory failure ([place]); and Elevated liver enzymes on their problem list.  Significant Hospital Events   8/09 Admit.  Consults:  PCCM.  Procedures:     Significant Diagnostic Tests:  CXR 8/9 > RML opacity.  Micro Data:  Blood 8/9 >  COVID 8/9 > neg.  Antimicrobials:  Vanc 8/9 > 8/11 Cefepime 8/9 >    Interim history/subjective:  O2 weaned to  6L HFNC (previously on 10La) Afebrile  Pt reports his breathing is not better Denies pain  Objective:  Blood pressure 121/68, pulse 89, temperature 97.6 F (36.4 C), temperature source Oral, resp. rate 16, height 5' 6" (1.676 m), weight 71.3 kg, SpO2 94 %.        Intake/Output Summary (Last 24 hours) at 01/20/2020 1445 Last data filed at 01/20/2020 1400 Gross per 24 hour  Intake 420 ml  Output 775 ml  Net -355 ml   Filed Weights   01/18/20 0335 01/19/20 0324 01/20/20 0544  Weight: 73.8 kg 71.6 kg 71.3 kg    Examination: General: elderly male lying in bed in NAD   HEENT: MM pink/moist, [place] O2 6L, anicteric  Neuro: AAOx4, speech clear, MAE  CV: s1s2 rrr, no m/r/g PULM: non-labored on [place] O2, lungs bilaterally with basilar crackles  GI: soft, bsx4 active  Extremities: warm/dry, no edema  Skin: no rashes or lesions  Assessment & Plan:   Acute on chronic hypoxic respiratory failure - multifactorial, likely combination of IPF flare, possible HAP, generalized deconditioning. -wean O2 for sats >90% -pulmonary hygiene - IS, mobilize -continue solumedrol until O2 needs improved / nearing baseline  -low dose anxiolysis, morphine   -continue OFEV, ? If diarrhea related to med > resolved  -follow LFT's -continue incruse, PRN xopenex  -agree with Palliative input  -anticipate this will be a slow process / recover  -may need SNF rehab short term   Generalized deconditioning. -PT  -consider SNF placement for rehab given wife's concerns for her ability to care for him at home.    PCCM will be available PRN.  Please call back if new needs arise.   Noe Gens, MSN, NP-C [facility] 01/20/2020, 2:51 PM   Please see [email or web address] for pager details.

## 2020-01-20 NOTE — Progress Notes (Signed)
PROGRESS NOTE    Jason Summers  YIR:[id number] DOB: 10/01/1931 DOA: 01/13/2020 PCP: [name], NP   Chief Complaint  Patient presents with   Respiratory Distress    Brief Narrative:  86 year old male prior history of lung cancer in remission, s/p chemo and radiation therapy, idiopathic pulmonary fibrosis on chronic steroids, sinus tachycardia, hyperlipidemia, depression, anxiety, GERD presents to ED with worsening shortness of breath.  He was admitted for IPF exacerbation and community-acquired pneumonia from 7/21/202127/28/2021 and discharged home in stable condition on doxycycline.  But presents to ED with progression of shortness of breath.  Currently he is requiring up to 5 L of nasal cannula oxygen to keep sats greater than 90%.  Due to persistent hypoxia and increased oxygen requirement PCCM consulted for recommendations.  Patient continues to require upto 10 lit of HF oxygen to keep sats greater than 90%. Unable to wean his oxygen . Pt seen and examined at bedside. No new complaints.    Assessment & Plan:   Principal Problem:   Right middle lobe pneumonia Active Problems:   Gastroesophageal reflux disease   IPF (idiopathic pulmonary fibrosis) (HCC)   HTN (hypertension)   HLD (hyperlipidemia)   Depression with anxiety   Chronic hypoxemic respiratory failure (HCC)   Elevated liver enzymes  Acute on chronic respiratory failure secondary to hospital-acquired pneumonia and IPF flareup  Continue cefepime , day 5 of antibiotics.  and PCCM added IV Solu-Medrol for the management of IPF flareup. Will probably discontinue IV cefepime after two more days and continue with solumedrol.  strep pneumonia and Legionella antigens are negative at this time. Continue with duo nebs. Blood cultures have been negative so far Patient currently denies any cough at this time.   Elevated liver enzymes Acute viral hepatitis panel is negative. Appears to be improving at this  time.   Anxiety As needed Xanax and controlled.    Depression Continue Celexa.   GERD PPI   Essential hypertension Well controlled BP parameters.  Continue with lopressor 12.5 mg BID.     DVT prophylaxis: (Lovenox) Code Status: Full code Family Communication: Family at bedside Disposition:   Status is: Inpatient  Remains inpatient appropriate because:IV treatments appropriate due to intensity of illness or inability to take PO   Dispo: The patient is from: Home              Anticipated d/c is to: pending.               Anticipated d/c date is: > 3 days              Patient currently is not medically stable to d/c.       Consultants:   PCCM.    Procedures: none.    Antimicrobials: Anti-infectives (From admission, onward)   Start     Dose/Rate Route Frequency Ordered Stop   01/17/20 0000  vancomycin (VANCOREADY) IVPB 750 mg/150 mL  Status:  Discontinued        750 mg 150 mL/hr over 60 Minutes Intravenous Every 12 hours 01/10/2020 1119 01/18/20 1436   01/22/2020 2200  ceFEPIme (MAXIPIME) 2 g in sodium chloride 0.9 % 100 mL IVPB     Discontinue     2 g 200 mL/hr over 30 Minutes Intravenous Every 12 hours 01/31/2020 1342     01/17/2020 1030  ceFEPIme (MAXIPIME) 2 g in sodium chloride 0.9 % 100 mL IVPB        2 g 200 mL/hr over 30 Minutes Intravenous  Once 02/04/2020 1016 01/20/2020 1232   01/27/2020 1030  vancomycin (VANCOREADY) IVPB 1500 mg/300 mL        1,500 mg 150 mL/hr over 120 Minutes Intravenous  Once 01/19/2020 1017 02/02/2020 1441         Subjective: BREATHING is the same, still remains on 10 lit of [place] oxygen.  No chest pain or cough.  No nausea, vomiting or abd pain.  Objective: Vitals:   01/20/20 0918 01/20/20 1021 01/20/20 1335 01/20/20 1400  BP: 121/68     Pulse: 89     Resp: 16     Temp: 97.6 F (36.4 C)     TempSrc: Oral     SpO2: 94% 94% 95% 94%  Weight:      Height:        Intake/Output Summary (Last 24 hours) at 01/20/2020 1433 Last  data filed at 01/20/2020 1400 Gross per 24 hour  Intake 420 ml  Output 775 ml  Net -355 ml   Filed Weights   01/18/20 0335 01/19/20 0324 01/20/20 0544  Weight: 73.8 kg 71.6 kg 71.3 kg    Examination:  General exam: alert and comfortable on 10 lit of  oxygen.  Respiratory system: diminished air entry at bases, tachypnea present. No rhonchi.  Cardiovascular system: s1s2 RRR no JVD, no pedal edema.  Gastrointestinal system: Abdomen soft, non tender non distended bowel sounds good.  Central nervous system: alert and oriented, grossly non focal.  Extremities: No cyanosis or clubbing Skin: No rashes seen Psychiatry: Mood is appropriate    Data Reviewed: I have personally reviewed following labs and imaging studies  CBC: Recent Labs  Lab 01/11/2020 0953 01/13/2020 1248 01/11/2020 1312 01/17/20 0125 01/20/20 0901  WBC 8.7  --  7.8 5.9 9.2  NEUTROABS 7.4  --   --   --  8.5*  HGB 14.1 13.3 13.4 12.9* 13.3  HCT 44.9 39.0 42.4 40.3 41.7  MCV 92.2  --  92.4 91.6 89.3  PLT 179  --  168 171 696    Basic Metabolic Panel: Recent Labs  Lab 01/25/2020 0953 02/01/2020 1248 01/23/2020 1312 01/17/20 0125 01/20/20 0901  NA 139 136  --  136 136  K 3.9 3.7  --  4.8 3.9  CL 95*  --   --  97* 96*  CO2 31  --   --  29 31  GLUCOSE 95  --   --  144* 172*  BUN 13  --   --  20 21  CREATININE 0.90  --  0.89 1.17 0.81  CALCIUM 8.7*  --   --  8.3* 8.6*  MG  --   --  2.1  --   --   PHOS  --   --  2.3*  --   --     GFR: Estimated Creatinine Clearance: 59.1 mL/min (by C-G formula based on SCr of 0.81 mg/dL).  Liver Function Tests: Recent Labs  Lab 02/05/2020 0953 01/17/20 0125  AST 109* 62*  ALT 144* 116*  ALKPHOS 73 73  BILITOT 0.8 1.1  PROT 6.2* 5.6*  ALBUMIN 2.7* 2.4*    CBG: No results for input(s): GLUCAP in the last 168 hours.   Recent Results (from the past 240 hour(s))  SARS Coronavirus 2 by RT PCR (hospital order, performed in [facility] hospital lab) Nasopharyngeal  Nasopharyngeal Swab     Status: None   Collection Time: 02/04/2020  9:54 AM   Specimen: Nasopharyngeal Swab  Result Value Ref Range Status  SARS Coronavirus 2 NEGATIVE NEGATIVE Final    Comment: (NOTE) SARS-CoV-2 target nucleic acids are NOT DETECTED.  The SARS-CoV-2 RNA is generally detectable in upper and lower respiratory specimens during the acute phase of infection. The lowest concentration of SARS-CoV-2 viral copies this assay can detect is 250 copies / mL. A negative result does not preclude SARS-CoV-2 infection and should not be used as the sole basis for treatment or other patient management decisions.  A negative result may occur with improper specimen collection / handling, submission of specimen other than nasopharyngeal swab, presence of viral mutation(s) within the areas targeted by this assay, and inadequate number of viral copies (<250 copies / mL). A negative result must be combined with clinical observations, patient history, and epidemiological information.  Fact Sheet for Patients:   StrictlyIdeas.no  Fact Sheet for Healthcare Providers: BankingDealers.co.za  This test is not yet approved or  cleared by the [place] FDA and has been authorized for detection and/or diagnosis of SARS-CoV-2 by FDA under an Emergency Use Authorization (EUA).  This EUA will remain in effect (meaning this test can be used) for the duration of the COVID-19 declaration under Section 564(b)(1) of the Act, 21 U.S.C. section 360bbb-3(b)(1), unless the authorization is terminated or revoked sooner.  Performed at [facility], [place] [address], [place], [place] [address]   Culture, blood (routine x 2)     Status: None (Preliminary result)   Collection Time: 01/20/2020 12:45 PM   Specimen: BLOOD RIGHT ARM  Result Value Ref Range Status   Specimen Description BLOOD RIGHT ARM  Final   Special Requests   Final    BOTTLES DRAWN AEROBIC  AND ANAEROBIC Blood Culture adequate volume   Culture   Final    NO GROWTH 4 DAYS Performed at [facility], [place] [address], [place], [place] [address]    Report Status PENDING  Incomplete  Culture, blood (routine x 2)     Status: None (Preliminary result)   Collection Time: 01/21/2020  1:00 PM   Specimen: BLOOD RIGHT HAND  Result Value Ref Range Status   Specimen Description BLOOD RIGHT HAND  Final   Special Requests   Final    BOTTLES DRAWN AEROBIC AND ANAEROBIC Blood Culture adequate volume   Culture   Final    NO GROWTH 4 DAYS Performed at [facility], [place] [address], [place], [place] [address]    Report Status PENDING  Incomplete  MRSA PCR Screening     Status: None   Collection Time: 01/18/20 10:32 AM   Specimen: Nasal Mucosa; Nasopharyngeal  Result Value Ref Range Status   MRSA by PCR NEGATIVE NEGATIVE Final    Comment:        The GeneXpert MRSA Assay (FDA approved for NASAL specimens only), is one component of a comprehensive MRSA colonization surveillance program. It is not intended to diagnose MRSA infection nor to guide or monitor treatment for MRSA infections. Performed at [facility], [place] [address] [place], [place] [address]          Radiology Studies: No results found.      Scheduled Meds:  citalopram  10 mg Oral Daily   enoxaparin (LOVENOX) injection  40 mg Subcutaneous Q24H   methylPREDNISolone (SOLU-MEDROL) injection  60 mg Intravenous Q12H   metoprolol tartrate  12.5 mg Oral BID   Nintedanib  150 mg Oral BID WC   pantoprazole  80 mg Oral Q1200   rosuvastatin  5 mg Oral QHS   umeclidinium bromide  1 puff Inhalation Daily   Continuous Infusions:  ceFEPime (MAXIPIME) IV 2 g (01/20/20 1019)     LOS: 4 days       Hosie Poisson, MD [facility]   To contact the attending provider between 7A-7P or the covering provider during after hours 7P-7A, please log into the web site [email or web address] and access  using universal [place] password for that web site. If you do not have the password, please call the hospital operator.  01/20/2020, 2:33 PM

## 2020-01-20 NOTE — Progress Notes (Signed)
Spoke with wife via TC as per requested by [facility] to answer questions about rehab and home caregiver options. Discussed with wife INPT rehab vs SNF options and also what HH would like if pt returns home. All questions answered that wife at at this time. Provided contact name and number should she have any further questions or if the son would like to speak with this Probation officer. TOC to continue to follow for transition needs. At this time wife is leaning on STSNF placement.

## 2020-01-21 DIAGNOSIS — Z7189 Other specified counseling: Secondary | ICD-10-CM | POA: Diagnosis not present

## 2020-01-21 DIAGNOSIS — J9611 Chronic respiratory failure with hypoxia: Secondary | ICD-10-CM | POA: Diagnosis not present

## 2020-01-21 DIAGNOSIS — R748 Abnormal levels of other serum enzymes: Secondary | ICD-10-CM | POA: Diagnosis not present

## 2020-01-21 DIAGNOSIS — F418 Other specified anxiety disorders: Secondary | ICD-10-CM | POA: Diagnosis not present

## 2020-01-21 DIAGNOSIS — J189 Pneumonia, unspecified organism: Secondary | ICD-10-CM | POA: Diagnosis not present

## 2020-01-21 DIAGNOSIS — Z515 Encounter for palliative care: Secondary | ICD-10-CM | POA: Diagnosis not present

## 2020-01-21 DIAGNOSIS — Z66 Do not resuscitate: Secondary | ICD-10-CM | POA: Diagnosis not present

## 2020-01-21 LAB — CULTURE, BLOOD (ROUTINE X 2)
Culture: NO GROWTH
Culture: NO GROWTH
Special Requests: ADEQUATE
Special Requests: ADEQUATE

## 2020-01-21 NOTE — Progress Notes (Signed)
PROGRESS NOTE    Jason Summers  IDH:[id number] DOB: [birth year] DOA: 01/17/2020 PCP: [name], NP   Chief Complaint  Patient presents with   Respiratory Distress    Brief Narrative:  86 year old male prior history of lung cancer in remission, s/p chemo and radiation therapy, idiopathic pulmonary fibrosis on chronic steroids, sinus tachycardia, hyperlipidemia, depression, anxiety, GERD presents to ED with worsening shortness of breath.  He was admitted for IPF exacerbation and community-acquired pneumonia from 7/21/202127/28/2021 and discharged home in stable condition on doxycycline.  But presents to ED with progression of shortness of breath.  Currently he is requiring up to 5 L of nasal cannula oxygen to keep sats greater than 90%.  Due to persistent hypoxia and increased oxygen requirement PCCM consulted for recommendations. He was started on IV solumedrol with improvement in the hypoxia.  Pt seen and examined at bedside. No new complaints today. No cough. Breathing the same. Plan to d/c cefepime after tomorrow's dose.   Assessment & Plan:   Principal Problem:   Right middle lobe pneumonia Active Problems:   Gastroesophageal reflux disease   IPF (idiopathic pulmonary fibrosis) (HCC)   HTN (hypertension)   HLD (hyperlipidemia)   Depression with anxiety   Chronic hypoxemic respiratory failure (HCC)   Elevated liver enzymes   Palliative care by specialist   Goals of care, counseling/discussion   DNR (do not resuscitate)   Dyspnea on exertion  Acute on chronic respiratory failure secondary to hospital-acquired pneumonia and IPF flareup  Continue cefepime , day 5 of antibiotics.  and PCCM added IV Solu-Medrol for the management of IPF flareup. Will probably discontinue IV cefepime after two more days and continue with solumedrol.  strep pneumonia and Legionella antigens are negative at this time. Continue with duo nebs. Blood cultures have been negative so far Patient currently  denies any cough at this time. PT evaluation recommending SNF.  inview of poor progression, unable to wean him off the oxygen, pt's wife unable to care for the patient, palliative care consulted for goals of care , they were agreeable to DNR, but would like to attempt short term SNF first. Recommend outpatient follow up with palliative care.    Elevated liver enzymes Acute viral hepatitis panel is negative. Appears to be improving at this time. Recommend outpatient followup of the liver enzymes in one week .  We will discontinue lipitor.    Anxiety Xanax increased to TID PRN.    Depression Continue Celexa.   GERD PPI   Essential hypertension WELL controlled BP parameters.  Continue with lopressor 12.5 mg BID.     DVT prophylaxis: (Lovenox) Code Status: Full code Family Communication: Family at bedside Disposition:   Status is: Inpatient  Remains inpatient appropriate because:IV treatments appropriate due to intensity of illness or inability to take PO   Dispo: The patient is from: Home              Anticipated d/c is to: SNF              Anticipated d/c date is: > 3 days              Patient currently is not medically stable to d/c.       Consultants:   PCCM.    Procedures: none.    Antimicrobials: Anti-infectives (From admission, onward)   Start     Dose/Rate Route Frequency Ordered Stop   01/17/20 0000  vancomycin (VANCOREADY) IVPB 750 mg/150 mL  Status:  Discontinued  750 mg 150 mL/hr over 60 Minutes Intravenous Every 12 hours 01/24/2020 1119 01/18/20 1436   01/13/2020 2200  ceFEPIme (MAXIPIME) 2 g in sodium chloride 0.9 % 100 mL IVPB     Discontinue     2 g 200 mL/hr over 30 Minutes Intravenous Every 12 hours 01/10/2020 1342     01/21/2020 1030  ceFEPIme (MAXIPIME) 2 g in sodium chloride 0.9 % 100 mL IVPB        2 g 200 mL/hr over 30 Minutes Intravenous  Once 01/28/2020 1016 01/31/2020 1232   01/15/2020 1030  vancomycin (VANCOREADY) IVPB 1500 mg/300 mL         1,500 mg 150 mL/hr over 120 Minutes Intravenous  Once 01/11/2020 1017 01/11/2020 1441         Subjective: Was able to decrease the oxygen , no chest pain or sob.   Objective: Vitals:   01/20/20 2025 01/20/20 2329 01/21/20 0502 01/21/20 1238  BP: 130/72 135/89 (!) 149/91 125/76  Pulse: 94 64 81 84  Resp: (!) _0 Temp: 97.6 F (36.4 C) 97.6 F (36.4 C) 97.6 F (36.4 C) 97.8 F (36.6 C)  TempSrc: Oral Oral Oral Oral  SpO2: 91% 93% 93% 92%  Weight:   73.4 kg   Height:        Intake/Output Summary (Last 24 hours) at 01/21/2020 1603 Last data filed at 01/21/2020 1321 Gross per 24 hour  Intake 840 ml  Output 1050 ml  Net -210 ml   Filed Weights   01/19/20 0324 01/20/20 0544 01/21/20 0502  Weight: 71.6 kg 71.3 kg 73.4 kg    Examination:  General exam: alert and comfortable.  Respiratory system: decreased air entry throughout the lungs, no wheezing heard, on [place] oxygen.  Cardiovascular system: S1S2, RRR no JVD no pedal edema.  Gastrointestinal system: Abdomen is soft , non tender nondistended, bowel sounds wnl.  Central nervous system: alert and oriented to person and place.  Extremities: no pedal edema.  Skin: No rashes seen.  Psychiatry: Mood is appropriate.     Data Reviewed: I have personally reviewed following labs and imaging studies  CBC: Recent Labs  Lab 02/07/2020 0953 01/26/2020 1248 02/02/2020 1312 01/17/20 0125 01/20/20 0901  WBC 8.7  --  7.8 5.9 9.2  NEUTROABS 7.4  --   --   --  8.5*  HGB 14.1 13.3 13.4 12.9* 13.3  HCT 44.9 39.0 42.4 40.3 41.7  MCV 92.2  --  92.4 91.6 89.3  PLT 179  --  168 171 443    Basic Metabolic Panel: Recent Labs  Lab 02/04/2020 0953 01/20/2020 1248 01/21/2020 1312 01/17/20 0125 01/20/20 0901  NA 139 136  --  136 136  K 3.9 3.7  --  4.8 3.9  CL 95*  --   --  97* 96*  CO2 31  --   --  29 31  GLUCOSE 95  --   --  144* 172*  BUN 13  --   --  20 21  CREATININE 0.90  --  0.89 1.17 0.81  CALCIUM 8.7*  --   --   8.3* 8.6*  MG  --   --  2.1  --   --   PHOS  --   --  2.3*  --   --     GFR: Estimated Creatinine Clearance: 59.1 mL/min (by C-G formula based on SCr of 0.81 mg/dL).  Liver Function Tests: Recent Labs  Lab 02/01/2020 [id number] 01/17/20 0125  AST 109* 62*  ALT 144* 116*  ALKPHOS 73 73  BILITOT 0.8 1.1  PROT 6.2* 5.6*  ALBUMIN 2.7* 2.4*    CBG: No results for input(s): GLUCAP in the last 168 hours.   Recent Results (from the past 240 hour(s))  SARS Coronavirus 2 by RT PCR (hospital order, performed in [facility] hospital lab) Nasopharyngeal Nasopharyngeal Swab     Status: None   Collection Time: 01/18/2020  9:54 AM   Specimen: Nasopharyngeal Swab  Result Value Ref Range Status   SARS Coronavirus 2 NEGATIVE NEGATIVE Final    Comment: (NOTE) SARS-CoV-2 target nucleic acids are NOT DETECTED.  The SARS-CoV-2 RNA is generally detectable in upper and lower respiratory specimens during the acute phase of infection. The lowest concentration of SARS-CoV-2 viral copies this assay can detect is 250 copies / mL. A negative result does not preclude SARS-CoV-2 infection and should not be used as the sole basis for treatment or other patient management decisions.  A negative result may occur with improper specimen collection / handling, submission of specimen other than nasopharyngeal swab, presence of viral mutation(s) within the areas targeted by this assay, and inadequate number of viral copies (<250 copies / mL). A negative result must be combined with clinical observations, patient history, and epidemiological information.  Fact Sheet for Patients:   StrictlyIdeas.no  Fact Sheet for Healthcare Providers: BankingDealers.co.za  This test is not yet approved or  cleared by the [place] FDA and has been authorized for detection and/or diagnosis of SARS-CoV-2 by FDA under an Emergency Use Authorization (EUA).  This EUA will remain in  effect (meaning this test can be used) for the duration of the COVID-19 declaration under Section 564(b)(1) of the Act, 21 U.S.C. section 360bbb-3(b)(1), unless the authorization is terminated or revoked sooner.  Performed at [facility], [place] [address] [place], [place] [address]   Culture, blood (routine x 2)     Status: None   Collection Time: 01/14/2020 12:45 PM   Specimen: BLOOD RIGHT ARM  Result Value Ref Range Status   Specimen Description BLOOD RIGHT ARM  Final   Special Requests   Final    BOTTLES DRAWN AEROBIC AND ANAEROBIC Blood Culture adequate volume   Culture   Final    NO GROWTH 5 DAYS Performed at [facility], [place] [address], [place], [place] [address]    Report Status 01/21/2020 FINAL  Final  Culture, blood (routine x 2)     Status: None   Collection Time: 01/11/2020  1:00 PM   Specimen: BLOOD RIGHT HAND  Result Value Ref Range Status   Specimen Description BLOOD RIGHT HAND  Final   Special Requests   Final    BOTTLES DRAWN AEROBIC AND ANAEROBIC Blood Culture adequate volume   Culture   Final    NO GROWTH 5 DAYS Performed at [facility], [place] [address], [place], [place] [address]    Report Status 01/21/2020 FINAL  Final  MRSA PCR Screening     Status: None   Collection Time: 01/18/20 10:32 AM   Specimen: Nasal Mucosa; Nasopharyngeal  Result Value Ref Range Status   MRSA by PCR NEGATIVE NEGATIVE Final    Comment:        The GeneXpert MRSA Assay (FDA approved for NASAL specimens only), is one component of a comprehensive MRSA colonization surveillance program. It is not intended to diagnose MRSA infection nor to guide or monitor treatment for MRSA infections. Performed at  [facility], [place] [address] [place], [place] [address]          Radiology Studies: No results found.      Scheduled Meds:  citalopram  10 mg Oral Daily   enoxaparin (LOVENOX) injection  40 mg Subcutaneous Q24H   methylPREDNISolone  (SOLU-MEDROL) injection  60 mg Intravenous Q12H   metoprolol tartrate  12.5 mg Oral BID   Nintedanib  150 mg Oral BID WC   pantoprazole  80 mg Oral Q1200   rosuvastatin  5 mg Oral QHS   umeclidinium bromide  1 puff Inhalation Daily   Continuous Infusions:  ceFEPime (MAXIPIME) IV 2 g (01/21/20 1100)     LOS: 5 days       Hosie Poisson, MD [facility]   To contact the attending provider between 7A-7P or the covering provider during after hours 7P-7A, please log into the web site [email or web address] and access using universal [place] password for that web site. If you do not have the password, please call the hospital operator.  01/21/2020, 4:03 PM

## 2020-01-21 NOTE — Progress Notes (Signed)
   Palliative Medicine Inpatient Follow Up Note  Reason for consult:  Goals of Care  HPI:  Per intake H&P --> 86 year old male prior history of lung cancer in remission, s/p chemo and radiation therapy, idiopathic pulmonary fibrosis on chronic steroids, sinus tachycardia, hyperlipidemia, depression, anxiety, GERD presents to ED with worsening shortness of breath. He was admitted for IPF exacerbation and community-acquired pneumonia from 7/21/202127/28/2021 and discharged home in stable condition on doxycycline. But presents to ED with progression of shortness of breath. Currently he is requiring up to 5 L of nasal cannula oxygen to keep sats greater than 90%.   Palliative care was asked to aid in goals of care conversations.   Today's Discussion (01/21/2020): Chart reviewed, it appears that [place] has had decreasing supplemental O2 needs. [place] is lying in bed upon assessment, he shares that he is more comfortable today from a breathing perspective. We reviewed his IPF and COPD diagnosis. He states that he hopes he can "get better". I shared with him that this is everyone's hope. We discussed that if every the burdens of treatment outweigh the benefit we could further discuss hospice.  I called patient wife Mickel Baas. We talked about how Tyrae was doing this morning. She expresses that they would like him to go to [place]. I shared with her that I would alert our SW team to this. She requested that I speak with her son Sheran Fava to provide him an update as well.  I spoke to [place] over the phone. We discussed Jasaiah's diagnosis. Sheran Fava shares that they have a great deal of faith in their Pulmonologist and would rely on the advisement of him moving forward in regards to next steps. Sheran Fava shares that yesterday they spoke as a family and and they all feel [place] transitioning to SNF to gain strength would be a feasible next step. He is interested in meeting with their pulmonologist  thereafter to get a better idea in terms of overall prognosis.   Discussed the importance of continued conversation with family and their  medical providers regarding overall plan of care and treatment options, ensuring decisions are within the context of the patients values and GOCs.  Provided  "Hard Choices for [employer]" booklet.   I spoke to Dr. Karleen Hampshire and informed her of this conversation as it may be of value to gain Dr. Golden Pop input.   SUMMARY OF RECOMMENDATIONS DNAR/DNI  MOST Completed, paper copy placed onto the chart    DNR Form Completed, paper copy placed onto the chart    TOC --> Patient family would like him to go to [place] with Op Palliative care  Ongoing PMT support, patient has a guarded prognosis presently in the setting of his lung disease and recurrent PNA's  Time Spent: 35 Greater than 50% of the time was spent in counseling and coordination of care ______________________________________________________________________________________ [place] Team Team Cell Phone: [phone] Please utilize secure chat with additional questions, if there is no response within 30 minutes please call the above phone number  Palliative Medicine Team providers are available by phone from 7am to 7pm daily and can be reached through the team cell phone.  Should this patient require assistance outside of these hours, please call the patient's attending physician.

## 2020-01-21 NOTE — Progress Notes (Signed)
Pt indicates he has struggled to breathe, has been very tired, but today is a little better.  Very cordial and appreciative.  Welcomed prayer.  Pt is Catholic.  Chaplain offered prayer and blessing.  He said wife should be coming soon; that she is at church.  Please contact as needed for f/u.  Luana Shu  [phone]    01/21/20 1100  Clinical Encounter Type  Visited With Patient  Visit Type Initial;Spiritual support  Referral From Patient  Consult/Referral To Chaplain  Spiritual Encounters  Spiritual Needs Prayer  Stress Factors  Patient Stress Factors  (difficulty breathing)

## 2020-01-22 DIAGNOSIS — J189 Pneumonia, unspecified organism: Secondary | ICD-10-CM | POA: Diagnosis not present

## 2020-01-22 DIAGNOSIS — F418 Other specified anxiety disorders: Secondary | ICD-10-CM | POA: Diagnosis not present

## 2020-01-22 DIAGNOSIS — Z515 Encounter for palliative care: Secondary | ICD-10-CM | POA: Diagnosis not present

## 2020-01-22 DIAGNOSIS — Z7189 Other specified counseling: Secondary | ICD-10-CM | POA: Diagnosis not present

## 2020-01-22 DIAGNOSIS — J9611 Chronic respiratory failure with hypoxia: Secondary | ICD-10-CM | POA: Diagnosis not present

## 2020-01-22 DIAGNOSIS — R748 Abnormal levels of other serum enzymes: Secondary | ICD-10-CM | POA: Diagnosis not present

## 2020-01-22 MED ORDER — METHYLPREDNISOLONE SODIUM SUCC 125 MG IJ SOLR
60.0000 mg | INTRAMUSCULAR | Status: DC
  Administered 2020-01-23 – 2020-01-25 (×3): 60 mg via INTRAVENOUS
  Filled 2020-01-22 (×3): qty 2

## 2020-01-22 MED ORDER — SODIUM CHLORIDE 0.9% FLUSH: 10.0000 mL | INTRAVENOUS | Status: DC | PRN

## 2020-01-22 MED ORDER — ENOXAPARIN SODIUM 40 MG/0.4ML ~~LOC~~ SOLN
40.0000 mg | SUBCUTANEOUS | Status: DC
  Administered 2020-01-22 – 2020-01-29 (×8): 40 mg via SUBCUTANEOUS
  Filled 2020-01-22 (×8): qty 0.4

## 2020-01-22 MED ORDER — SODIUM CHLORIDE 0.9% FLUSH
10.0000 mL | Freq: Two times a day (BID) | INTRAVENOUS | Status: DC
  Administered 2020-01-22 – 2020-01-30 (×16): 10 mL

## 2020-01-22 NOTE — Progress Notes (Signed)
Hydrologist [facility])  Hospital Liaison: RN note       Notified by [facility] manager of patient/family request for [facility] Palliative services at home after discharge.       Writer spoke with spouse to confirm interest and explain services. Plan is to D/C to [facility] for rehab if bed available.            [place] Palliative team will follow up with patient after discharge.       Please call with any hospice or palliative related questions.       Thank you for this referral.       Clementeen Hoof, RN, BSN [place] (listed on [place] under Hospice and [place] [facility] [place])   [phone]

## 2020-01-22 NOTE — Progress Notes (Addendum)
   Palliative Medicine Inpatient Follow Up Note  Reason for consult:  Goals of Care  HPI:  Per intake H&P --> 86 year old male prior history of lung cancer in remission, s/p chemo and radiation therapy, idiopathic pulmonary fibrosis on chronic steroids, sinus tachycardia, hyperlipidemia, depression, anxiety, GERD presents to ED with worsening shortness of breath. He was admitted for IPF exacerbation and community-acquired pneumonia from 7/21/202127/28/2021 and discharged home in stable condition on doxycycline. But presents to ED with progression of shortness of breath. Currently he is requiring up to 5 L of nasal cannula oxygen to keep sats greater than 90%.   Palliative care was asked to aid in goals of care conversations.   Today's Discussion (01/22/2020): Chart reviewed. Jason Summers remains on 6LPM [place] for O2 support. I met with Jason Summers at bedside, he shares that he feels "about the same" as he did yesterday. I told him that I spoke with his wife and son yesterday and provided them an update. We talked about the hope for improvement and transition to skilled care. We discussed gaining further insights from his pulmonologist.  Jason Summers's greatest goal is to get back home at some point.  SUMMARY OF RECOMMENDATIONS DNAR/DNI  MOST / DNR Completed, paper copy placed onto the chart    Patient family would like him to go to [place] with Op Palliative care  Ongoing PMT support, patient has a guarded prognosis presently in the setting of his lung disease and recurrent PNA's  Low dose morphine added two days ago for dyspnea, patient has not received any to date  Time Spent: 15 Greater than 50% of the time was spent in counseling and coordination of care ______________________________________________________________________________________ [name] Team Team Cell Phone: [phone] Please utilize secure chat with additional questions, if there is no  response within 30 minutes please call the above phone number  Palliative Medicine Team providers are available by phone from 7am to 7pm daily and can be reached through the team cell phone.  Should this patient require assistance outside of these hours, please call the patient's attending physician.

## 2020-01-22 NOTE — TOC Progression Note (Signed)
Transition of Care [facility]) - Progression Note    Patient Details  Name: Jason Summers MRN: [id number] Date of Birth: 08/16/1931  Transition of Care [facility]) CM/SW [place], [place] Phone Number: 01/22/2020, 9:51 AM  Clinical Narrative:     CSW called patient's son,Stephano- he advised the family has selected [name] SNF- CSW sent referral to [facility]- waiting on response.  CSW provided patient's son with other bed offers to consider while waiting on response form SNF.   Thurmond Butts, MSW, LCSWA Clinical Social Worker    Expected Discharge Plan: Skilled Nursing Facility Barriers to Discharge: Ship broker, Continued Medical Work up, SNF Pending bed offer  Expected Discharge Plan and Services Expected Discharge Plan: [place] In-house Referral: Clinical Social Work                                             Social Determinants of Health (SDOH) Interventions    Readmission Risk Interventions No flowsheet data found.

## 2020-01-22 NOTE — Progress Notes (Signed)
PROGRESS NOTE    Jason Summers  ATF:[id number] DOB: 10/13/1931 DOA: 01/10/2020 PCP: [name], NP   Chief Complaint  Patient presents with  . Respiratory Distress    Brief Narrative:  86 year old male prior history of lung cancer in remission, s/p chemo and radiation therapy, idiopathic pulmonary fibrosis on chronic steroids, sinus tachycardia, hyperlipidemia, depression, anxiety, GERD presents to ED with worsening shortness of breath.  He was admitted for IPF exacerbation and community-acquired pneumonia from 7/21/202127/28/2021 and discharged home in stable condition on doxycycline.  But presents to ED with progression of shortness of breath.  Currently he is requiring up to 5 L of nasal cannula oxygen to keep sats greater than 90%.  Due to persistent hypoxia and increased oxygen requirement PCCM consulted for recommendations. He was started on IV solumedrol with improvement in the hypoxia. Completed the course of cefepime and weaning steroids.  Pt seen and examined at bedside, no new complaints.    Assessment & Plan:   Principal Problem:   Right middle lobe pneumonia Active Problems:   Gastroesophageal reflux disease   IPF (idiopathic pulmonary fibrosis) (HCC)   HTN (hypertension)   HLD (hyperlipidemia)   Depression with anxiety   Chronic hypoxemic respiratory failure (HCC)   Elevated liver enzymes   Palliative care by specialist   Goals of care, counseling/discussion   DNR (do not resuscitate)   Dyspnea on exertion  Acute on chronic respiratory failure secondary to hospital-acquired pneumonia and IPF flareup  Pt completed 7 days of IV cefepime.  and PCCM added IV Solu-Medrol for the management of IPF flareup. will d/c cefepime after today's dose.  Decrease the dose of solumedrol from 60 mg BID TO daily.  Plan to wean his oxygen as appropriate.  strep pneumonia and Legionella antigens are negative at this time. Continue with duo nebs. Blood cultures have been negative so  far Patient currently denies any cough at this time. Pt afebrile and wbc count wnl. PT evaluation recommending SNF.  inview of poor progression, unable to wean him off the oxygen, pt's wife unable to care for the patient, palliative care consulted for goals of care , they were agreeable to DNR, but would like to attempt short term SNF first. Recommend outpatient follow up with palliative care.    Elevated liver enzymes Acute viral hepatitis panel is negative. Appears to be improving at this time. Recommend outpatient followup of the liver enzymes in one week .  We will discontinue lipitor.    Anxiety Xanax increased to TID PRN.    Depression Continue Celexa.   GERD PPI   Essential hypertension Well controlled.     DVT prophylaxis: (Lovenox) Code Status: Full code Family Communication: None at bedside.  Disposition:   Status is: Inpatient  Remains inpatient appropriate because:IV treatments appropriate due to intensity of illness or inability to take PO   Dispo: The patient is from: Home              Anticipated d/c is to: SNF              Anticipated d/c date is: 2 days              Patient currently is not medically stable to d/c.       Consultants:   PCCM.    Procedures: none.    Antimicrobials: Anti-infectives (From admission, onward)   Start     Dose/Rate Route Frequency Ordered Stop   01/17/20 0000  vancomycin (VANCOREADY) IVPB 750 mg/150 mL  Status:  Discontinued        750 mg 150 mL/hr over 60 Minutes Intravenous Every 12 hours 01/10/2020 1119 01/18/20 1436   01/27/2020 2200  ceFEPIme (MAXIPIME) 2 g in sodium chloride 0.9 % 100 mL IVPB     Discontinue     2 g 200 mL/hr over 30 Minutes Intravenous Every 12 hours 02/01/2020 1342 01/22/20 2359   02/03/2020 1030  ceFEPIme (MAXIPIME) 2 g in sodium chloride 0.9 % 100 mL IVPB        2 g 200 mL/hr over 30 Minutes Intravenous  Once 01/24/2020 1016 02/03/2020 1232   02/07/2020 1030  vancomycin (VANCOREADY) IVPB  1500 mg/300 mL        1,500 mg 150 mL/hr over 120 Minutes Intravenous  Once 02/01/2020 1017 01/28/2020 1441         Subjective: No new complaints. Reports feeling the same as yesterday, no changes.  Objective: Vitals:   01/22/20 0028 01/22/20 0332 01/22/20 0642 01/22/20 0800  BP: (!) 146/86 132/83  130/84  Pulse: 74 63 65 89  Resp:  20  20  Temp: 97.6 F (36.4 C) 97.8 F (36.6 C)  97.7 F (36.5 C)  TempSrc: Oral Oral  Oral  SpO2: 96% 98% 92% 93%  Weight:   70.2 kg   Height:        Intake/Output Summary (Last 24 hours) at 01/22/2020 1442 Last data filed at 01/22/2020 0600 Gross per 24 hour  Intake 120 ml  Output 1430 ml  Net -1310 ml   Filed Weights   01/20/20 0544 01/21/20 0502 01/22/20 0642  Weight: 71.3 kg 73.4 kg 70.2 kg    Examination:  General exam: Alert and comfortable on 8 L of nasal cannula oxygen. Respiratory system: Diminished air entry at bases, no wheezing, tachypnea present Cardiovascular system: S1-S2 heard, regular rate rhythm, no JVD, no pedal edema Gastrointestinal system: Abdomen is soft, nontender, nondistended, bowel sounds normal Central nervous system: Alert and oriented to place and person, grossly nonfocal Extremities: no pedal edema.  Skin: NO rashes seen.  Psychiatry: Mood is appropriate.     Data Reviewed: I have personally reviewed following labs and imaging studies  CBC: Recent Labs  Lab 01/13/2020 0953 02/04/2020 1248 01/25/2020 1312 01/17/20 0125 01/20/20 0901  WBC 8.7  --  7.8 5.9 9.2  NEUTROABS 7.4  --   --   --  8.5*  HGB 14.1 13.3 13.4 12.9* 13.3  HCT 44.9 39.0 42.4 40.3 41.7  MCV 92.2  --  92.4 91.6 89.3  PLT 179  --  168 171 591    Basic Metabolic Panel: Recent Labs  Lab 01/15/2020 0953 01/09/2020 1248 02/06/2020 1312 01/17/20 0125 01/20/20 0901  NA 139 136  --  136 136  K 3.9 3.7  --  4.8 3.9  CL 95*  --   --  97* 96*  CO2 31  --   --  29 31  GLUCOSE 95  --   --  144* 172*  BUN 13  --   --  20 21  CREATININE 0.90   --  0.89 1.17 0.81  CALCIUM 8.7*  --   --  8.3* 8.6*  MG  --   --  2.1  --   --   PHOS  --   --  2.3*  --   --     GFR: Estimated Creatinine Clearance: 59.1 mL/min (by C-G formula based on SCr of 0.81 mg/dL).  Liver Function Tests: Recent Labs  Lab 01/28/2020 0953 01/17/20 0125  AST 109* 62*  ALT 144* 116*  ALKPHOS 73 73  BILITOT 0.8 1.1  PROT 6.2* 5.6*  ALBUMIN 2.7* 2.4*    CBG: No results for input(s): GLUCAP in the last 168 hours.   Recent Results (from the past 240 hour(s))  SARS Coronavirus 2 by RT PCR (hospital order, performed in [facility] hospital lab) Nasopharyngeal Nasopharyngeal Swab     Status: None   Collection Time: 01/19/2020  9:54 AM   Specimen: Nasopharyngeal Swab  Result Value Ref Range Status   SARS Coronavirus 2 NEGATIVE NEGATIVE Final    Comment: (NOTE) SARS-CoV-2 target nucleic acids are NOT DETECTED.  The SARS-CoV-2 RNA is generally detectable in upper and lower respiratory specimens during the acute phase of infection. The lowest concentration of SARS-CoV-2 viral copies this assay can detect is 250 copies / mL. A negative result does not preclude SARS-CoV-2 infection and should not be used as the sole basis for treatment or other patient management decisions.  A negative result may occur with improper specimen collection / handling, submission of specimen other than nasopharyngeal swab, presence of viral mutation(s) within the areas targeted by this assay, and inadequate number of viral copies (<250 copies / mL). A negative result must be combined with clinical observations, patient history, and epidemiological information.  Fact Sheet for Patients:   StrictlyIdeas.no  Fact Sheet for Healthcare Providers: BankingDealers.co.za  This test is not yet approved or  cleared by the [place] FDA and has been authorized for detection and/or diagnosis of SARS-CoV-2 by FDA under an Emergency Use  Authorization (EUA).  This EUA will remain in effect (meaning this test can be used) for the duration of the COVID-19 declaration under Section 564(b)(1) of the Act, 21 U.S.C. section 360bbb-3(b)(1), unless the authorization is terminated or revoked sooner.  Performed at [facility], [place] [address] [place], [place] [address]   Culture, blood (routine x 2)     Status: None   Collection Time: 01/20/2020 12:45 PM   Specimen: BLOOD RIGHT ARM  Result Value Ref Range Status   Specimen Description BLOOD RIGHT ARM  Final   Special Requests   Final    BOTTLES DRAWN AEROBIC AND ANAEROBIC Blood Culture adequate volume   Culture   Final    NO GROWTH 5 DAYS Performed at [facility], [place] [address], [place], [place] [address]    Report Status 01/21/2020 FINAL  Final  Culture, blood (routine x 2)     Status: None   Collection Time: 01/08/2020  1:00 PM   Specimen: BLOOD RIGHT HAND  Result Value Ref Range Status   Specimen Description BLOOD RIGHT HAND  Final   Special Requests   Final    BOTTLES DRAWN AEROBIC AND ANAEROBIC Blood Culture adequate volume   Culture   Final    NO GROWTH 5 DAYS Performed at [facility], [place] [address], [place], [place] [address]    Report Status 01/21/2020 FINAL  Final  MRSA PCR Screening     Status: None   Collection Time: 01/18/20 10:32 AM   Specimen: Nasal Mucosa; Nasopharyngeal  Result Value Ref Range Status   MRSA by PCR NEGATIVE NEGATIVE Final    Comment:        The GeneXpert MRSA Assay (FDA approved for NASAL specimens only), is one component of a comprehensive MRSA colonization surveillance program. It is not intended to diagnose MRSA infection nor to guide or monitor  treatment for MRSA infections. Performed at [facility], [place] [address], [place], [place] [address]          Radiology Studies: No results found.      Scheduled Meds: . citalopram  10 mg Oral Daily  . enoxaparin (LOVENOX) injection  40 mg  Subcutaneous Q24H  . [START ON 01/23/2020] methylPREDNISolone (SOLU-MEDROL) injection  60 mg Intravenous Q24H  . metoprolol tartrate  12.5 mg Oral BID  . Nintedanib  150 mg Oral BID WC  . pantoprazole  80 mg Oral Q1200  . sodium chloride flush  10-40 mL Intracatheter Q12H  . umeclidinium bromide  1 puff Inhalation Daily   Continuous Infusions: . ceFEPime (MAXIPIME) IV 2 g (01/22/20 1032)     LOS: 6 days       Hosie Poisson, MD [facility]   To contact the attending provider between 7A-7P or the covering provider during after hours 7P-7A, please log into the web site [email or web address] and access using universal [place] password for that web site. If you do not have the password, please call the hospital operator.  01/22/2020, 2:42 PM

## 2020-01-23 ENCOUNTER — Telehealth: Payer: Self-pay | Admitting: Internal Medicine

## 2020-01-23 DIAGNOSIS — Z515 Encounter for palliative care: Secondary | ICD-10-CM | POA: Diagnosis not present

## 2020-01-23 DIAGNOSIS — J189 Pneumonia, unspecified organism: Principal | ICD-10-CM

## 2020-01-23 DIAGNOSIS — F419 Anxiety disorder, unspecified: Secondary | ICD-10-CM

## 2020-01-23 DIAGNOSIS — R0600 Dyspnea, unspecified: Secondary | ICD-10-CM | POA: Diagnosis not present

## 2020-01-23 DIAGNOSIS — R748 Abnormal levels of other serum enzymes: Secondary | ICD-10-CM | POA: Diagnosis not present

## 2020-01-23 DIAGNOSIS — Z66 Do not resuscitate: Secondary | ICD-10-CM | POA: Diagnosis not present

## 2020-01-23 DIAGNOSIS — Z7189 Other specified counseling: Secondary | ICD-10-CM | POA: Diagnosis not present

## 2020-01-23 DIAGNOSIS — J9611 Chronic respiratory failure with hypoxia: Secondary | ICD-10-CM | POA: Diagnosis not present

## 2020-01-23 DIAGNOSIS — F418 Other specified anxiety disorders: Secondary | ICD-10-CM | POA: Diagnosis not present

## 2020-01-23 LAB — CBC WITH DIFFERENTIAL/PLATELET
Abs Immature Granulocytes: 0.1 10*3/uL — ABNORMAL HIGH (ref 0.00–0.07)
Basophils Absolute: 0 10*3/uL (ref 0.0–0.1)
Basophils Relative: 0 %
Eosinophils Absolute: 0 10*3/uL (ref 0.0–0.5)
Eosinophils Relative: 0 %
HCT: 43.3 % (ref 39.0–52.0)
Hemoglobin: 14 g/dL (ref 13.0–17.0)
Immature Granulocytes: 1 %
Lymphocytes Relative: 6 %
Lymphs Abs: 0.6 10*3/uL — ABNORMAL LOW (ref 0.7–4.0)
MCH: 28.9 pg (ref 26.0–34.0)
MCHC: 32.3 g/dL (ref 30.0–36.0)
MCV: 89.5 fL (ref 80.0–100.0)
Monocytes Absolute: 0.7 10*3/uL (ref 0.1–1.0)
Monocytes Relative: 7 %
Neutro Abs: 9.1 10*3/uL — ABNORMAL HIGH (ref 1.7–7.7)
Neutrophils Relative %: 86 %
Platelets: 205 10*3/uL (ref 150–400)
RBC: 4.84 MIL/uL (ref 4.22–5.81)
RDW: 13.5 % (ref 11.5–15.5)
WBC: 10.6 10*3/uL — ABNORMAL HIGH (ref 4.0–10.5)
nRBC: 0 % (ref 0.0–0.2)

## 2020-01-23 LAB — COMPREHENSIVE METABOLIC PANEL
ALT: 51 U/L — ABNORMAL HIGH (ref 0–44)
AST: 23 U/L (ref 15–41)
Albumin: 2.5 g/dL — ABNORMAL LOW (ref 3.5–5.0)
Alkaline Phosphatase: 64 U/L (ref 38–126)
Anion gap: 7 (ref 5–15)
BUN: 27 mg/dL — ABNORMAL HIGH (ref 8–23)
CO2: 34 mmol/L — ABNORMAL HIGH (ref 22–32)
Calcium: 8.5 mg/dL — ABNORMAL LOW (ref 8.9–10.3)
Chloride: 95 mmol/L — ABNORMAL LOW (ref 98–111)
Creatinine, Ser: 0.79 mg/dL (ref 0.61–1.24)
GFR calc Af Amer: >60 mL/min (ref >60)
GFR calc non Af Amer: >60 mL/min (ref >60)
Glucose, Bld: 95 mg/dL (ref 70–99)
Potassium: 4.9 mmol/L (ref 3.5–5.1)
Sodium: 136 mmol/L (ref 135–145)
Total Bilirubin: 0.8 mg/dL (ref 0.3–1.2)
Total Protein: 5.2 g/dL — ABNORMAL LOW (ref 6.5–8.1)

## 2020-01-23 LAB — GLUCOSE, CAPILLARY: Glucose-Capillary: 82 mg/dL (ref 70–99)

## 2020-01-23 MED ORDER — ALPRAZOLAM 0.5 MG PO TABS
0.5000 mg | ORAL_TABLET | Freq: Three times a day (TID) | ORAL | Status: DC | PRN
  Administered 2020-01-23 – 2020-01-25 (×4): 0.5 mg via ORAL
  Filled 2020-01-23 (×4): qty 1

## 2020-01-23 NOTE — Progress Notes (Signed)
Manufacturing engineer [facility]) Community Based Palliative Care       This patient has been referred to our palliative care services in the community.  [facility] will continue to follow for any discharge planning needs and to coordinate continuation of palliative care.   If you have questions or need assistance, please call [phone] or contact the hospital Liaison listed on AMION.     Thank you for the opportunity to participate in this patient's care.     Domenic Moras, BSN, RN [facility] Liaison   [phone]

## 2020-01-23 NOTE — Progress Notes (Addendum)
   Palliative Medicine Inpatient Follow Up Note  Reason for consult:  Goals of Care  HPI:  Per intake H&P --> 86 year old male prior history of lung cancer in remission, s/p chemo and radiation therapy, idiopathic pulmonary fibrosis on chronic steroids, sinus tachycardia, hyperlipidemia, depression, anxiety, GERD presents to ED with worsening shortness of breath. He was admitted for IPF exacerbation and community-acquired pneumonia from 7/21/202127/28/2021 and discharged home in stable condition on doxycycline. But presents to ED with progression of shortness of breath. Currently he is requiring up to 5 L of nasal cannula oxygen to keep sats greater than 90%.   Palliative care was asked to aid in goals of care conversations.   Today's Discussion (01/23/2020): Chart reviewed. Belinda remains on 6-7LPM [place] for O2 support.  He has been stable throughout the evening per discussion with his bedside RN. Upon AM assessment he is noted to be resting in NAD.   Eating 40-60% of meals, (+) BM, no pain at the present time  Plan for transition to [facility] with OP Palliative care  Addendum: I spoke to daytime nurse who shared with me Petars alprazolam had to be increased due to severe anxiety. She shares that he seems to recognize that his current health state is poor.  I met with Evaristo Bury at bedside. She and I discussed Oshay's present situation and that he is unable to mobilize without desaturating. Mickel Baas states that she see's that when she is at bedside, she expresses that she feels confused over "what to do next." She states that [facility] has denied him due to the SNF being out of network. She states that she truly wishes that she could take him home but she does not see this as a reality. Allowed time for her to express her feelings. Provided support through therapeutic listening.   I was able to speak with Dr. Karleen Hampshire - she has messaged Petars primary pulmonologist to provide further insights.    Additional Time: 25  SUMMARY OF RECOMMENDATIONS DNAR/DNI  MOST / DNR Completed, paper copy placed onto the chart    SNF with Op Palliative care - [facility] spoke to patients wife  Ideally, if we could gain the insights of his primary pulmonologist, Dr. Chase Caller it would be of benefit for the patient and his family to better understand his disease process moving forward  Low dose morphine added three days ago for dyspnea --> patient has not received any to date  Receiving alprazolam regularly which does provide relief of anxiety   PMT will remain peripherally involved. Please contact us for any urgent needs.  Time Spent: 15 Greater than 50% of the time was spent in counseling and coordination of care ______________________________________________________________________________________ [place] Team Team Cell Phone: [phone] Please utilize secure chat with additional questions, if there is no response within 30 minutes please call the above phone number  Palliative Medicine Team providers are available by phone from 7am to 7pm daily and can be reached through the team cell phone.  Should this patient require assistance outside of these hours, please call the patient's attending physician.

## 2020-01-23 NOTE — Progress Notes (Signed)
Physical Therapy Treatment Patient Details Name: Jason Summers MRN: [id number] DOB: 1932/05/12 Today's Date: 01/23/2020    History of Present Illness 86 y.o. male presenting with worsening SOB x2-3 days and difficulty ambulating. Patient admitted with R middle lobe pneumonia. Patient with recent hospitalization from 7/21-7/28 with sepsis/acute hypoxic respiratory failure 2/2 IPF exacerbation and CAP. Since admission, patient with increased need for supplemental O2 of 10L via HFNC. PMHx significant for lung CA in remission s/p chemo and radiation tehrapy, ideopathic pulmonary fibrosis, HLD, depression/anxiety, and GERD.     PT Comments    Pt is available to do exercises with PT but SOB with effort.  He is too tired to get OOB but agreed today to do ex.  He was noted to have fluctuations down in sats during even bed ex but with effort also has elevation in RR.  Lowest sat was 93% with mobility to move LE's.  Re attempt gait tomorrow if pt can allow.-  Follow Up Recommendations  SNF     Equipment Recommendations  None recommended by PT    Recommendations for Other Services Other (comment)     Precautions / Restrictions Precautions Precautions: Fall Precaution Comments: monitor SpO2 and RR Restrictions Weight Bearing Restrictions: No    Mobility  Bed Mobility                  Transfers                    Ambulation/Gait                 Stairs             Wheelchair Mobility    Modified Rankin (Stroke Patients Only)       Balance                                            Cognition Arousal/Alertness: Awake/alert Behavior During Therapy: WFL for tasks assessed/performed Overall Cognitive Status: Within Functional Limits for tasks assessed                                        Exercises General Exercises - Lower Extremity Ankle Circles/Pumps: AROM;5 reps Quad Sets: AROM;10 reps Gluteal Sets: AROM;10  reps Heel Slides: AAROM;10 reps Hip ABduction/ADduction: AAROM;10 reps Straight Leg Raises: AAROM;10 reps Hip Flexion/Marching: AAROM;10 reps    General Comments        Pertinent Vitals/Pain Pain Assessment: Faces Faces Pain Scale: No hurt    Home Living                      Prior Function            PT Goals (current goals can now be found in the care plan section) Acute Rehab PT Goals Patient Stated Goal: To return home.  Progress towards PT goals: Progressing toward goals    Frequency    Min 3X/week      PT Plan Current plan remains appropriate    Co-evaluation              AM-PAC PT "6 Clicks" Mobility   Outcome Measure  Help needed turning from your back to your side while in a flat bed without using bedrails?: A Little Help needed  moving from lying on your back to sitting on the side of a flat bed without using bedrails?: A Little Help needed moving to and from a bed to a chair (including a wheelchair)?: A Little Help needed standing up from a chair using your arms (e.g., wheelchair or bedside chair)?: A Little Help needed to walk in hospital room?: A Little Help needed climbing 3-5 steps with a railing? : A Lot 6 Click Score: 17    End of Session Equipment Utilized During Treatment: Oxygen Activity Tolerance: Treatment limited secondary to medical complications (Comment) Patient left: in bed;with call bell/phone within reach;with bed alarm set;with family/visitor present Nurse Communication: Mobility status PT Visit Diagnosis: Difficulty in walking, not elsewhere classified (R26.2)     Time: 1550-1610 PT Time Calculation (min) (ACUTE ONLY): 20 min  Charges:  $Therapeutic Exercise: 8-22 mins             Jason Summers 01/23/2020, 10:07 PM  Mee Hives, PT MS Acute Rehab Dept. Number: [place] and [place]

## 2020-01-23 NOTE — Progress Notes (Signed)
PROGRESS NOTE    Jason Summers  BKO:[id number] DOB: 1931/11/09 DOA: 01/17/2020 PCP: [name], NP   Chief Complaint  Patient presents with  . Respiratory Distress    Brief Narrative:  86 year old male prior history of lung cancer in remission, s/p chemo and radiation therapy, idiopathic pulmonary fibrosis on chronic steroids, sinus tachycardia, hyperlipidemia, depression, anxiety, GERD presents to ED with worsening shortness of breath.  He was admitted for IPF exacerbation and community-acquired pneumonia from 7/21/202127/28/2021 and discharged home in stable condition on doxycycline.  But presents to ED with progression of shortness of breath.  Currently he is requiring up to 5 L of nasal cannula oxygen to keep sats greater than 90%.  Due to persistent hypoxia and increased oxygen requirement PCCM consulted for recommendations. He was started on IV solumedrol with improvement in the hypoxia. Completed the course of cefepime and weaning steroids. He is currently on 6 lit of [place] oxygen with sats about 93% and on ambulation they are in low 80's despite being on steroids and have completed a course of antibiotics. Unfortunately he might need high flow oxygen on discharge with slow tapering of steroids over a period of 2 to 3 weeks.     Assessment & Plan:   Principal Problem:   Right middle lobe pneumonia Active Problems:   Gastroesophageal reflux disease   IPF (idiopathic pulmonary fibrosis) (HCC)   HTN (hypertension)   HLD (hyperlipidemia)   Depression with anxiety   Chronic hypoxemic respiratory failure (HCC)   Elevated liver enzymes   Palliative care by specialist   Goals of care, counseling/discussion   DNR (do not resuscitate)   Dyspnea on exertion  Acute on chronic respiratory failure secondary to hospital-acquired pneumonia and IPF flareup  Pt completed 7 days of IV cefepime.  and PCCM added IV Solu-Medrol for the management of IPF flareup. Completed the course of cefepime. We  have decreased solumedrol to 60 mg daily, and weaned his oxygen to 6 lit /min today. . strep pneumonia and Legionella antigens are negative at this time. Continue with duo nebs. Blood cultures have been negative so far Patient currently denies any cough at this time. Pt afebrile and wbc count wnl. PT evaluation recommending SNF.  inview of poor progression, unable to wean him off the oxygen, pt's wife unable to care for the patient, palliative care consulted for goals of care , they were agreeable to DNR, but would like to attempt short term SNF first. Recommend outpatient follow up with palliative care.    Elevated liver enzymes Acute viral hepatitis panel is negative. Appears to be improving at this time. Recommend outpatient followup of the liver enzymes in one week .  We will discontinue lipitor.    Anxiety Xanax increased to 0.5 mg TID PRN.    Depression Continue Celexa.   GERD PPI   Essential hypertension Well controlled.     DVT prophylaxis: (Lovenox) Code Status: DNR Family Communication: wife at bedside.  Disposition:   Status is: Inpatient  Remains inpatient appropriate because:IV treatments appropriate due to intensity of illness or inability to take PO   Dispo: The patient is from: Home              Anticipated d/c is to: SNF              Anticipated d/c date is: 2 days              Patient currently is not medically stable to d/c.  Consultants:   PCCM.    Procedures: none.    Antimicrobials: Anti-infectives (From admission, onward)   Start     Dose/Rate Route Frequency Ordered Stop   01/17/20 0000  vancomycin (VANCOREADY) IVPB 750 mg/150 mL  Status:  Discontinued        750 mg 150 mL/hr over 60 Minutes Intravenous Every 12 hours 01/19/2020 1119 01/18/20 1436   01/15/2020 2200  ceFEPIme (MAXIPIME) 2 g in sodium chloride 0.9 % 100 mL IVPB        2 g 200 mL/hr over 30 Minutes Intravenous Every 12 hours 01/15/2020 1342 01/22/20 2151    01/20/2020 1030  ceFEPIme (MAXIPIME) 2 g in sodium chloride 0.9 % 100 mL IVPB        2 g 200 mL/hr over 30 Minutes Intravenous  Once 02/07/2020 1016 01/19/2020 1232   01/23/2020 1030  vancomycin (VANCOREADY) IVPB 1500 mg/300 mL        1,500 mg 150 mL/hr over 120 Minutes Intravenous  Once 01/19/2020 1017 01/26/2020 1441         Subjective: No new events overnight. No chest pain , pt very anxIous and we have increased the xanax to 0.5 mg TID Objective: Vitals:   01/23/20 0357 01/23/20 0803 01/23/20 0844 01/23/20 1327  BP: (!) 145/75 107/77  105/69  Pulse: 65 77  70  Resp: _0 Temp: 97.7 F (36.5 C) 97.8 F (36.6 C)  97.8 F (36.6 C)  TempSrc: Oral Oral  Oral  SpO2: 93% 93% 97% 94%  Weight: 70.6 kg     Height:        Intake/Output Summary (Last 24 hours) at 01/23/2020 1729 Last data filed at 01/23/2020 1300 Gross per 24 hour  Intake 240 ml  Output 745 ml  Net -505 ml   Filed Weights   01/21/20 0502 01/22/20 0642 01/23/20 0357  Weight: 73.4 kg 70.2 kg 70.6 kg    Examination:  General exam: ALERT, appears anxious, on 6l it of [place] oxygen.  Respiratory system: diminished air entry at bases, no wheezing heard. Tachypnea present.  Cardiovascular system: S1S2 Heard, RRR, no JVD, no pedal edema.  Gastrointestinal system: abdomen is soft, non tender non distended. Bowel sounds wnl.  Central nervous system: alert and oriented. Grossly non focal.  Extremities: No pedal edema.  Skin: no rashes seen.  Psychiatry: pt anxious and restless.    Data Reviewed: I have personally reviewed following labs and imaging studies  CBC: Recent Labs  Lab 01/17/20 0125 01/20/20 0901 01/23/20 0155  WBC 5.9 9.2 10.6*  NEUTROABS  --  8.5* 9.1*  HGB 12.9* 13.3 14.0  HCT 40.3 41.7 43.3  MCV 91.6 89.3 89.5  PLT 171 189 001    Basic Metabolic Panel: Recent Labs  Lab 01/17/20 0125 01/20/20 0901 01/23/20 0155  NA 136 136 136  K 4.8 3.9 4.9  CL 97* 96* 95*  CO2 29 31 34*  GLUCOSE 144*  172* 95  BUN 20 21 27*  CREATININE 1.17 0.81 0.79  CALCIUM 8.3* 8.6* 8.5*    GFR: Estimated Creatinine Clearance: 59.8 mL/min (by C-G formula based on SCr of 0.79 mg/dL).  Liver Function Tests: Recent Labs  Lab 01/17/20 0125 01/23/20 0155  AST 62* 23  ALT 116* 51*  ALKPHOS 73 64  BILITOT 1.1 0.8  PROT 5.6* 5.2*  ALBUMIN 2.4* 2.5*    CBG: Recent Labs  Lab 01/23/20 0710  GLUCAP 82     Recent Results (from the past  240 hour(s))  SARS Coronavirus 2 by RT PCR (hospital order, performed in [facility] hospital lab) Nasopharyngeal Nasopharyngeal Swab     Status: None   Collection Time: 01/27/2020  9:54 AM   Specimen: Nasopharyngeal Swab  Result Value Ref Range Status   SARS Coronavirus 2 NEGATIVE NEGATIVE Final    Comment: (NOTE) SARS-CoV-2 target nucleic acids are NOT DETECTED.  The SARS-CoV-2 RNA is generally detectable in upper and lower respiratory specimens during the acute phase of infection. The lowest concentration of SARS-CoV-2 viral copies this assay can detect is 250 copies / mL. A negative result does not preclude SARS-CoV-2 infection and should not be used as the sole basis for treatment or other patient management decisions.  A negative result may occur with improper specimen collection / handling, submission of specimen other than nasopharyngeal swab, presence of viral mutation(s) within the areas targeted by this assay, and inadequate number of viral copies (<250 copies / mL). A negative result must be combined with clinical observations, patient history, and epidemiological information.  Fact Sheet for Patients:   StrictlyIdeas.no  Fact Sheet for Healthcare Providers: BankingDealers.co.za  This test is not yet approved or  cleared by the [place] FDA and has been authorized for detection and/or diagnosis of SARS-CoV-2 by FDA under an Emergency Use Authorization (EUA).  This EUA will remain in effect  (meaning this test can be used) for the duration of the COVID-19 declaration under Section 564(b)(1) of the Act, 21 U.S.C. section 360bbb-3(b)(1), unless the authorization is terminated or revoked sooner.  Performed at [facility], [place] [address] [place], [place] [address]   Culture, blood (routine x 2)     Status: None   Collection Time: 02/06/2020 12:45 PM   Specimen: BLOOD RIGHT ARM  Result Value Ref Range Status   Specimen Description BLOOD RIGHT ARM  Final   Special Requests   Final    BOTTLES DRAWN AEROBIC AND ANAEROBIC Blood Culture adequate volume   Culture   Final    NO GROWTH 5 DAYS Performed at [facility], [place] [address] [place], [place] [address]    Report Status 01/21/2020 FINAL  Final  Culture, blood (routine x 2)     Status: None   Collection Time: 02/07/2020  1:00 PM   Specimen: BLOOD RIGHT HAND  Result Value Ref Range Status   Specimen Description BLOOD RIGHT HAND  Final   Special Requests   Final    BOTTLES DRAWN AEROBIC AND ANAEROBIC Blood Culture adequate volume   Culture   Final    NO GROWTH 5 DAYS Performed at [facility], [place] [address] [place], [place] [address]    Report Status 01/21/2020 FINAL  Final  MRSA PCR Screening     Status: None   Collection Time: 01/18/20 10:32 AM   Specimen: Nasal Mucosa; Nasopharyngeal  Result Value Ref Range Status   MRSA by PCR NEGATIVE NEGATIVE Final    Comment:        The GeneXpert MRSA Assay (FDA approved for NASAL specimens only), is one component of a comprehensive MRSA colonization surveillance program. It is not intended to diagnose MRSA infection nor to guide or monitor treatment for MRSA infections. Performed at [facility], [place] [address], [place], [place] [address]          Radiology Studies: No results found.      Scheduled Meds: . citalopram  10 mg Oral Daily  . enoxaparin (LOVENOX) injection  40 mg Subcutaneous Q24H  . methylPREDNISolone (SOLU-MEDROL)  injection  60 mg Intravenous Q24H  . metoprolol tartrate  12.5 mg Oral BID  . Nintedanib  150 mg Oral BID WC  . pantoprazole  80 mg Oral Q1200  . sodium chloride flush  10-40 mL Intracatheter Q12H  . umeclidinium bromide  1 puff Inhalation Daily   Continuous Infusions:    LOS: 7 days       Hosie Poisson, MD [facility]   To contact the attending provider between 7A-7P or the covering provider during after hours 7P-7A, please log into the web site [email or web address] and access using universal [place] password for that web site. If you do not have the password, please call the hospital operator.  01/23/2020, 5:29 PM

## 2020-01-23 NOTE — TOC Progression Note (Addendum)
Transition of Care [facility]) - Progression Note    Patient Details  Name: Jason Summers MRN: [id number] Date of Birth: 12-Jul-1931  Transition of Care [facility]) CM/SW [place], [place] Phone Number: 01/23/2020, 10:33 AM  Clinical Narrative:     CSW informed patient and spouse [name] advised, insurance is not in network.   CSW called patient's son -provided placement update- he inquired about private pay w/SNF- CSW deferred him to SNF- he will call CSW back once he discuss further with his parents.  Barriers:  patient needs updated PT notes in order to start insurance auth Family needs to provide SNF choice CSW will need to confirm bed offer once choice is selected Will need insurance auth before d/c to SNF  Thurmond Butts, MSW, [place] Worker   Expected Discharge Plan: Skilled Nursing Facility Barriers to Discharge: Ship broker, Continued Medical Work up, SNF Pending bed offer  Expected Discharge Plan and Services Expected Discharge Plan: [place] In-house Referral: Clinical Social Work                                             Social Determinants of Health (SDOH) Interventions    Readmission Risk Interventions No flowsheet data found.

## 2020-01-23 NOTE — TOC Progression Note (Signed)
Transition of Care [facility]) - Progression Note    Patient Details  Name: Jason Summers MRN: [id number] Date of Birth: 17-Jul-1931  Transition of Care [facility]) CM/SW [place], [place] Phone Number: 01/23/2020, 2:30 PM  Clinical Narrative:     CSW called son,Stephano for SNF choice- he states he will discuss with his mother and call CSW back. CSW informed patient is medically stable to discharge to next venue.   Reached out to [facility] SN[facility]- waiting on response-  Thurmond Butts, MSW, [place] Worker   Expected Discharge Plan: Skilled Nursing Facility Barriers to Discharge: Ship broker, Continued Medical Work up, SNF Pending bed offer  Expected Discharge Plan and Services Expected Discharge Plan: [place] In-house Referral: Clinical Social Work                                             Social Determinants of Health (SDOH) Interventions    Readmission Risk Interventions No flowsheet data found.

## 2020-01-23 NOTE — Telephone Encounter (Signed)
Called and spoke with pt's wife Mickel Baas to check with her to see if pt would  Be returning back home after rehab once discharged and she stated that she believed that he would be able to. Due to that, stated to her that we were not going to place order to d/c his O2 so that way he would have it once returned back home. Mickel Baas verbalized understanding. Nothing further needed.

## 2020-01-24 ENCOUNTER — Ambulatory Visit: Payer: Medicare Other | Admitting: Internal Medicine

## 2020-01-24 ENCOUNTER — Ambulatory Visit (INDEPENDENT_AMBULATORY_CARE_PROVIDER_SITE_OTHER): Payer: Medicare Other | Admitting: Internal Medicine

## 2020-01-24 ENCOUNTER — Telehealth: Payer: Self-pay | Admitting: Internal Medicine

## 2020-01-24 DIAGNOSIS — R748 Abnormal levels of other serum enzymes: Secondary | ICD-10-CM | POA: Diagnosis not present

## 2020-01-24 DIAGNOSIS — F418 Other specified anxiety disorders: Secondary | ICD-10-CM | POA: Diagnosis not present

## 2020-01-24 DIAGNOSIS — J84112 Idiopathic pulmonary fibrosis: Secondary | ICD-10-CM | POA: Diagnosis not present

## 2020-01-24 DIAGNOSIS — J9611 Chronic respiratory failure with hypoxia: Secondary | ICD-10-CM

## 2020-01-24 DIAGNOSIS — J189 Pneumonia, unspecified organism: Secondary | ICD-10-CM | POA: Diagnosis not present

## 2020-01-24 NOTE — Progress Notes (Addendum)
OV 01/24/2020  Subjective:  Patient ID: Jason Summers, male , DOB: 09-05-1931 , age 86 y.o. , MRN: [id number] , ADDRESS: [place] [address]  Type of visit: Telephone/Video Circumstance: COVID-19 national emergency Identification of patient Jason Summers with [birth year] and MRN [id number] - 2 person identifier but call with wife because patient is in hospital Risks: Risks, benefits, limitations of telephone visit explained. Patient understood and verbalized agreement to proceed Anyone else on call: Wife the main person. Patient not on call Patient location: Currently at hospital. Wife is on her cell and daughter Jason Summers- evaluating [place] This provider location: [facility]    01/24/2020 -      HPI Jason Summers 86 y.o. -got a message from his son yesterday that he wanted me to talk to patient 1.  I also spoke to the hospitalist this morning.  Patient is currently inpatient.  His face-to-face visit has been changed to a telephone visit.  Spoke to Dr. Karleen Hampshire the hospitalist.  She tells me that patient is currently on 6 L nasal cannula at rest but easily desaturates even when he tries to get out of the bed.  According to the hospitalist patient is extremely frail and deconditioned.  The wife's immediate question was to have a preferred choice of rehabilitation which she likes but it is out of network.  The daughter Valarie Merino also came on the phone.  At this point in time I discussed my impression of what is going on.  Based on review of the chart and the history it appears patient has IPF flare.  I explained to them that the prognosis for flare is very poor and most patients do not survive the next few to several months.  And if this physical deconditioning it is very difficult to overcome that in the setting of severe hypoxemia.  Explained to them even before the figure out the choice of rehabilitation which is unfortunately restricted because of insurance issues  and I told him to respect that.  However I did tell them that they need to reconvene with palliative care and decide the appropriate goals of care.  In my view patient is hospice eligible.  They fully understood the concept of hospice.  They are struggling to figure out what support and how they can give support to the patient at home.  Patient apparently wants to go home.  Told him to discuss with the hospitalist team and palliative care team about options of nursing home versus hospice at hospice facility versus hospice at home.  Told him that if the ultimately chose pulmonary rehabilitation or inpatient rehabilitation then they had to be fully aware that it will be a time-limited trial with a low possibility of success..  I conveyed all this to the hospitalist.   Then  5:58 PM= spoke to son who wanted conversatin.  He spoke to his mom and sister.  They are all aware of the fax.  They are aware of IPF flare and the poor prognosis..  Patient's wife feels they want to try pulmonary rehabilitation.  There might be a bed available at [facility].  I expressed to the son that I do not know much about this place although it is rated 4.4 starts on Google reviews.  He said it related to scars and Medicare.  They may just accept the place because of insurance reasons and Covid restrictions.  ROS - per HPI     has a  past medical history of Acute rhinitis (08/10/2017), Acute stress disorder (10/27/2007), Anxiety (07/14/2018), Arthritis, Bilateral epiphora (09/25/2015), Blush (09/23/2007), Cancer ([place]), Chest pain in adult (12/16/2016), Chronic right-sided thoracic back pain (12/16/2016), Compression fracture of body of thoracic vertebra ([place]) (02/09/2019), Dysphagia (07/17/2010), Eustachian tube dysfunction, bilateral (08/10/2017), Ganglion of joint (04/22/2007), Gastroesophageal reflux disease (05/15/2016), Headache (01/30/2016), Hemorrhoids, HEMORRHOIDS, INTERNAL (02/18/2007), IPF (idiopathic pulmonary fibrosis) ([place])  (07/14/2018), Lateral epicondylitis (tennis elbow), Lingular mass (09/07/2015), Mass of lingula of lung (09/07/2015), Mass of lower lobe of left lung (09/07/2015), Mediastinal adenopathy (09/07/2015), Mediastinal lymphadenopathy (08/27/2015), Multiple pulmonary nodules (08/27/2015), Nausea and vomiting (05/15/2016), Non-small cell cancer of left lung ([place]) (09/24/2015), Non-small cell lung cancer (NSCLC) ([place]) (dx'f 07/30/15), Nuclear sclerosis of right eye (09/25/2015), Odynophagia (11/06/2015), Osteoarthritis (07/17/2010), Persistent headaches (02/03/2017), Pleural effusion on left (08/14/2016), Pseudophakia, left eye (09/25/2015), Pulmonary emphysema ([place]) (08/14/2016), Radiation fibrosis of lung ([place]) (08/14/2016), Shortness of breath (08/14/2016), Soft tissue lesion of shoulder region (04/22/2007), Spondylosis of cervical joint, and SPRAIN AND STRAIN OF SACROILIAC (09/06/2008).   reports that he quit smoking about 8 years ago. His smoking use included cigarettes. He has a 12.40 pack-year smoking history. He has never used smokeless tobacco.  Past Surgical History:  Procedure Laterality Date  . COLONOSCOPY  2003  . KNEE ARTHROSCOPY  2006   right  . RIGHT HEART CATH N/A 10/06/2019   Procedure: RIGHT HEART CATH;  Surgeon: Nelva Bush, MD;  Location: [facility];  Service: Cardiovascular;  Laterality: N/A;    Allergies  Allergen Reactions  . Gentamicin Other (See Comments)    Made eyes red, swollen, hot feeling  . Loteprednol-Tobramycin Other (See Comments)    Made eyes red, swollen, hot feeling    Immunization History  Administered Date(s) Administered  . Fluad Quad(high Dose 65+) 02/16/2019  . Influenza Split 03/19/2011, 03/12/2012  . Influenza Whole 06/09/1997, 04/22/2007, 03/14/2008, 03/28/2009, 05/09/2010  . Influenza, High Dose Seasonal PF 04/14/2013, 04/11/2015, 03/02/2017, 03/23/2018  . Influenza,inj,Quad PF,6+ Mos 05/07/2016  . Influenza-Unspecified 04/10/2014  . PFIZER SARS-COV-2 Vaccination  07/01/2019, 07/23/2019  . Pneumococcal Conjugate-13 10/14/2013  . Pneumococcal Polysaccharide-23 06/09/2004, 10/04/2012  . Td 06/09/2005  . Zoster 02/04/2015    Family History  Problem Relation Age of Onset  . Hypertension Mother   . Heart disease Mother   . Coronary artery disease Other     No current facility-administered medications for this visit.  Current Outpatient Medications:  .  metoprolol tartrate (LOPRESSOR) 25 MG tablet, TAKE 1/2 TABLET(12.5 MG) BY MOUTH TWICE DAILY, Disp: 90 tablet, Rfl: 3  Facility-Administered Medications Ordered in Other Visits:  .  acetaminophen (TYLENOL) tablet 650 mg, 650 mg, Oral, Q6H PRN **OR** acetaminophen (TYLENOL) suppository 650 mg, 650 mg, Rectal, Q6H PRN, Pahwani, Rinka R, MD .  ALPRAZolam Duanne Moron) tablet 0.5 mg, 0.5 mg, Oral, TID PRN, Hosie Poisson, MD, 0.5 mg at 01/23/20 2149 .  calcium carbonate (TUMS - dosed in mg elemental calcium) chewable tablet 200 mg of elemental calcium, 1 tablet, Oral, BID PRN, Pahwani, Rinka R, MD .  citalopram (CELEXA) tablet 10 mg, 10 mg, Oral, Daily, Pahwani, Rinka R, MD, 10 mg at 01/23/20 0943 .  enoxaparin (LOVENOX) injection 40 mg, 40 mg, Subcutaneous, Q24H, Hosie Poisson, MD, 40 mg at 01/23/20 0947 .  levalbuterol (XOPENEX) nebulizer solution 0.63 mg, 0.63 mg, Nebulization, Q3H PRN, Desai, Rahul P, PA-C, 0.63 mg at 01/21/20 0621 .  methylPREDNISolone sodium succinate (SOLU-MEDROL) 125 mg/2 mL injection 60 mg, 60 mg, Intravenous, Q24H, Hosie Poisson, MD, 60 mg  at 01/23/20 0946 .  metoprolol tartrate (LOPRESSOR) tablet 12.5 mg, 12.5 mg, Oral, BID, Pahwani, Rinka R, MD, 12.5 mg at 01/23/20 2148 .  morphine 10 MG/5ML solution 2.5 mg, 2.5 mg, Oral, Q4H PRN, Rosezella Rumpf, NP, 2.5 mg at 01/23/20 1628 .  Nintedanib (OFEV) CAPS 150 mg, 150 mg, Oral, BID WC, Pahwani, Rinka R, MD, 150 mg at 01/23/20 1829 .  ondansetron (ZOFRAN) tablet 4 mg, 4 mg, Oral, Q6H PRN **OR** ondansetron (ZOFRAN) injection 4 mg, 4 mg,  Intravenous, Q6H PRN, Pahwani, Rinka R, MD .  oxyCODONE (Oxy IR/ROXICODONE) immediate release tablet 5 mg, 5 mg, Oral, Q4H PRN, Pahwani, Rinka R, MD, 5 mg at 01/23/20 2148 .  pantoprazole (PROTONIX) EC tablet 80 mg, 80 mg, Oral, Q1200, Pahwani, Rinka R, MD, 80 mg at 01/23/20 1629 .  sodium chloride flush (NS) 0.9 % injection 10-40 mL, 10-40 mL, Intracatheter, Q12H, Hosie Poisson, MD, 10 mL at 01/23/20 2253 .  sodium chloride flush (NS) 0.9 % injection 10-40 mL, 10-40 mL, Intracatheter, PRN, Hosie Poisson, MD .  umeclidinium bromide (INCRUSE ELLIPTA) 62.5 MCG/INH 1 puff, 1 puff, Inhalation, Daily, Pahwani, Rinka R, MD, 1 puff at 01/24/20 0733      Objective:   There were no vitals filed for this visit.  Estimated body mass index is 25.07 kg/m as calculated from the following:   Height as of 02/06/2020: _0  (1.676 m).   Weight as of an earlier encounter on 01/24/20: 155 lb 4.8 oz (70.4 kg).  _1 @  There were no vitals filed for this visit.   Physical Exam        Assessment:       ICD-10-CM   1. IPF (idiopathic pulmonary fibrosis) ([place])  J84.112   2. Chronic respiratory failure with hypoxia (HCC)  J96.11        Plan:     Patient Instructions     ICD-10-CM   1. IPF (idiopathic pulmonary fibrosis) ([place])  J84.112   2. Chronic respiratory failure with hypoxia (HCC)  J96.11     Give followup in October over telephone/video  (Telephone visit - Level 03 visit: Estb 21-30 for this visit type which was visit type: telephone visit in total care time and counseling or/and coordination of care by this undersigned MD - Dr Brand Males. This includes one or more of the following for care delivered on 01/24/2020 same day: pre-charting, chart review, note writing, documentation discussion of test results, diagnostic or treatment recommendations, prognosis, risks and benefits of management options, instructions, education, compliance or risk-factor reduction. It excludes time  spent by the [place] or office staff in the care of the patient. Actual time was 24 min. E&M code is [phone])   SIGNATURE    Dr. Brand Males, M.D., F.C.C.P,  Pulmonary and Critical Care Medicine Staff Physician, [place] Director - Interstitial Lung Disease  Program  Pulmonary [place] at [place], [place], [address]  Pager: [phone], If no answer or between  15:00h - 7:00h: call [phone] Telephone: [phone]  9:49 AM 01/24/2020

## 2020-01-24 NOTE — Progress Notes (Signed)
Palliative Medicine RN Note: Rec'd a call from pt's son Sheran Fava.   He reports that his mother and sister spoke with Dr Chase Caller this morning, and they were told that rehab is not a good option for Mr Rousseau. Sheran Fava had questions about what is provided by hospice versus palliative at home, and we discussed facility placement in light of his mother's caregiving limitations/need for hired assistance. He reports that they have the resources to pay for help at home; I reminded him that with the resurgence of COVID, visiting at a facility would likely be not a good idea if allowed at all.  Sheran Fava would like to talk to SW Caren Griffins to discuss options for care going forward. If Dr Chase Caller does not feel that Mr Sermeno is appropriate for rehab, he certainly meets home hospice criteria, and it would be optimal to get hospice involved as soon as Mr Brinson qualifies. Sheran Fava verbalized understanding of this. I obtained hospice order to facilitate discharge planning/referrals.  I called Caren Griffins to discuss/update, and she let me know that, while I was talking to Sheran Fava, Dr Karleen Hampshire re-consulted [place] for [place].  Marjie Skiff Denean Pavon, RN, BSN, [facility] Palliative Medicine Team 01/24/2020 4:31 PM Office [phone]

## 2020-01-24 NOTE — TOC Progression Note (Signed)
Transition of Care [facility]) - Progression Note    Patient Details  Name: Jason Summers MRN: [id number] Date of Birth: Jun 12, 1931  Transition of Care [facility]) CM/SW [place], [place] Phone Number: 01/24/2020, 12:09 PM  Clinical Narrative:     Per patient's son,Stephano request- CSW contacted [facility]- declined bed offer, [facility], not accepting new admits. CSW updated patient's son.  Thurmond Butts, MSW, LCSWA Clinical Social Worker   Expected Discharge Plan: Skilled Nursing Facility Barriers to Discharge: Ship broker, Continued Medical Work up, SNF Pending bed offer  Expected Discharge Plan and Services Expected Discharge Plan: [place] In-house Referral: Clinical Social Work                                             Social Determinants of Health (SDOH) Interventions    Readmission Risk Interventions No flowsheet data found.

## 2020-01-24 NOTE — Patient Instructions (Signed)
ICD-10-CM   1. IPF (idiopathic pulmonary fibrosis) ([place])  J84.112   2. Chronic respiratory failure with hypoxia (HCC)  J96.11     Give followup in October over telephone/video

## 2020-01-24 NOTE — Telephone Encounter (Signed)
appt was scheduled. Nothing further needed.

## 2020-01-24 NOTE — Progress Notes (Signed)
PROGRESS NOTE    Jason Summers  HCW:[id number] DOB: Feb 24, 1932 DOA: 01/08/2020 PCP: [name], NP   Chief Complaint  Patient presents with  . Respiratory Distress    Brief Narrative:  86 year old male prior history of lung cancer in remission, s/p chemo and radiation therapy, idiopathic pulmonary fibrosis on chronic steroids, sinus tachycardia, hyperlipidemia, depression, anxiety, GERD presents to ED with worsening shortness of breath.  He was admitted for IPF exacerbation and community-acquired pneumonia from 7/21/202127/28/2021 and discharged home in stable condition on doxycycline.  But presents to ED with progression of shortness of breath.  Currently he is requiring up to 5 L of nasal cannula oxygen to keep sats greater than 90%.  Due to persistent hypoxia and increased oxygen requirement PCCM consulted for recommendations. He was started on IV solumedrol with improvement in the hypoxia. Completed the course of cefepime and weaning steroids. He is currently on 6 lit of Ambia oxygen with sats about 93% and on ambulation they are in low 80's despite being on steroids and have completed a course of antibiotics. Unfortunately he might need high flow oxygen on discharge with slow tapering of steroids over a period of 2 to 3 weeks.  Dr Chase Caller their pulmonologist spoke with the patient's wife and discussed the poor prognosis, will need another meeting with the palliative care team to see if family wishes to further discuss goals of care and disposition.     Assessment & Plan:   Principal Problem:   Right middle lobe pneumonia Active Problems:   Gastroesophageal reflux disease   IPF (idiopathic pulmonary fibrosis) (HCC)   HTN (hypertension)   HLD (hyperlipidemia)   Depression with anxiety   Chronic hypoxemic respiratory failure (HCC)   Elevated liver enzymes   Palliative care by specialist   Goals of care, counseling/discussion   DNR (do not resuscitate)   Dyspnea on exertion  Acute  on chronic respiratory failure secondary to hospital-acquired pneumonia and IPF flareup  Pt completed 7 days of IV cefepime.  and PCCM added IV Solu-Medrol for the management of IPF flareup. Completed the course of cefepime. We have decreased solumedrol to 60 mg daily, and weaned his oxygen to 6 lit /min today. . strep pneumonia and Legionella antigens are negative at this time. Plan to transition to oral prednisone 40 mg daily in 2 to 3 days. Schedule steroids to be weaned off over a period of 2 weeks as per PCCM.  Continue with duo nebs. Blood cultures have been negative so far Patient currently denies any cough at this time. Pt afebrile and wbc count wnl. PT evaluation recommending SNF.  inview of poor progression, unable to wean him off the oxygen, pt's wife unable to care for the patient at home, poor prognosis,  palliative care consulted for goals of care , they were agreeable to DNR, but would like to attempt short term SNF first.  Will need repeat [place] meeting with palliative care prior to discharge.    Elevated liver enzymes Acute viral hepatitis panel is negative. Appears to be improving at this time. Recommend outpatient followup of the liver enzymes in one week .  We will discontinue lipitor.    Anxiety Xanax increased to 0.5 mg TID PRN.    Depression Continue Celexa.   GERD PPI   Essential hypertension Well controlled.   Leukocytosis probably reactive.    DVT prophylaxis: (Lovenox) Code Status: DNR Family Communication:None at bedside.  Disposition:   Status is: Inpatient  Remains inpatient appropriate because:Unsafe d/c plan,  IV treatments appropriate due to intensity of illness or inability to take PO and Inpatient level of care appropriate due to severity of illness, unable to wean his oxygen. Family hasn't decided on SNF yet.    Dispo: The patient is from: Home              Anticipated d/c is to: SNF              Anticipated d/c date is: 2 days               Patient currently is not medically stable to d/c.       Consultants:   PCCM.   Palliative care     Procedures: none.    Antimicrobials: Anti-infectives (From admission, onward)   Start     Dose/Rate Route Frequency Ordered Stop   01/17/20 0000  vancomycin (VANCOREADY) IVPB 750 mg/150 mL  Status:  Discontinued        750 mg 150 mL/hr over 60 Minutes Intravenous Every 12 hours 02/04/2020 1119 01/18/20 1436   01/23/2020 2200  ceFEPIme (MAXIPIME) 2 g in sodium chloride 0.9 % 100 mL IVPB        2 g 200 mL/hr over 30 Minutes Intravenous Every 12 hours 02/01/2020 1342 01/22/20 2151   01/18/2020 1030  ceFEPIme (MAXIPIME) 2 g in sodium chloride 0.9 % 100 mL IVPB        2 g 200 mL/hr over 30 Minutes Intravenous  Once 01/09/2020 1016 01/20/2020 1232   01/17/2020 1030  vancomycin (VANCOREADY) IVPB 1500 mg/300 mL        1,500 mg 150 mL/hr over 120 Minutes Intravenous  Once 02/04/2020 1017 01/27/2020 1441         Subjective: No new events. Despite increase in his xanax, he remains anxious.  Objective: Vitals:   01/23/20 2026 01/24/20 0009 01/24/20 0526 01/24/20 0800  BP: (!) 120/91 121/73 116/76 99/62  Pulse: 92 63 63 77  Resp:  _0 Temp: 97.6 F (36.4 C) 97.7 F (36.5 C) 97.8 F (36.6 C) 97.6 F (36.4 C)  TempSrc: Oral Oral Oral Oral  SpO2: 92% 95% 94% 93%  Weight:   70.4 kg   Height:        Intake/Output Summary (Last 24 hours) at 01/24/2020 1112 Last data filed at 01/24/2020 0526 Gross per 24 hour  Intake 120 ml  Output 675 ml  Net -555 ml   Filed Weights   01/22/20 0642 01/23/20 0357 01/24/20 0526  Weight: 70.2 kg 70.6 kg 70.4 kg    Examination:  General exam: Alert, does not appear to be in distress when at rest on 6 L of nasal cannula oxygen. Respiratory system: Decreased air entry throughout the lungs, no wheezing or rhonchi tachypnea present when talking and on ambulation. Cardiovascular system: S1-S2 heard, regular rate rhythm, no JVD, no pedal  edema. Gastrointestinal system: Abdomen is soft, nontender, nondistended, bowel sounds normal Central nervous system: Alert and oriented, grossly nonfocal Extremities: No pedal edema Skin: No rashes seen Psychiatry: Patient anxious and restless.    Data Reviewed: I have personally reviewed following labs and imaging studies  CBC: Recent Labs  Lab 01/20/20 0901 01/23/20 0155  WBC 9.2 10.6*  NEUTROABS 8.5* 9.1*  HGB 13.3 14.0  HCT 41.7 43.3  MCV 89.3 89.5  PLT 189 093    Basic Metabolic Panel: Recent Labs  Lab 01/20/20 0901 01/23/20 0155  NA 136 136  K 3.9 4.9  CL 96* 95*  CO2 31 34*  GLUCOSE 172* 95  BUN 21 27*  CREATININE 0.81 0.79  CALCIUM 8.6* 8.5*    GFR: Estimated Creatinine Clearance: 59.8 mL/min (by C-G formula based on SCr of 0.79 mg/dL).  Liver Function Tests: Recent Labs  Lab 01/23/20 0155  AST 23  ALT 51*  ALKPHOS 64  BILITOT 0.8  PROT 5.2*  ALBUMIN 2.5*    CBG: Recent Labs  Lab 01/23/20 0710  GLUCAP 82     Recent Results (from the past 240 hour(s))  SARS Coronavirus 2 by RT PCR (hospital order, performed in [facility] hospital lab) Nasopharyngeal Nasopharyngeal Swab     Status: None   Collection Time: 01/08/2020  9:54 AM   Specimen: Nasopharyngeal Swab  Result Value Ref Range Status   SARS Coronavirus 2 NEGATIVE NEGATIVE Final    Comment: (NOTE) SARS-CoV-2 target nucleic acids are NOT DETECTED.  The SARS-CoV-2 RNA is generally detectable in upper and lower respiratory specimens during the acute phase of infection. The lowest concentration of SARS-CoV-2 viral copies this assay can detect is 250 copies / mL. A negative result does not preclude SARS-CoV-2 infection and should not be used as the sole basis for treatment or other patient management decisions.  A negative result may occur with improper specimen collection / handling, submission of specimen other than nasopharyngeal swab, presence of viral mutation(s) within the areas  targeted by this assay, and inadequate number of viral copies (<250 copies / mL). A negative result must be combined with clinical observations, patient history, and epidemiological information.  Fact Sheet for Patients:   StrictlyIdeas.no  Fact Sheet for Healthcare Providers: BankingDealers.co.za  This test is not yet approved or  cleared by the [place] FDA and has been authorized for detection and/or diagnosis of SARS-CoV-2 by FDA under an Emergency Use Authorization (EUA).  This EUA will remain in effect (meaning this test can be used) for the duration of the COVID-19 declaration under Section 564(b)(1) of the Act, 21 U.S.C. section 360bbb-3(b)(1), unless the authorization is terminated or revoked sooner.  Performed at [facility], [place] [address], [place], [place] [address]   Culture, blood (routine x 2)     Status: None   Collection Time: 01/13/2020 12:45 PM   Specimen: BLOOD RIGHT ARM  Result Value Ref Range Status   Specimen Description BLOOD RIGHT ARM  Final   Special Requests   Final    BOTTLES DRAWN AEROBIC AND ANAEROBIC Blood Culture adequate volume   Culture   Final    NO GROWTH 5 DAYS Performed at [facility], [place] [address], [place], [place] [address]    Report Status 01/21/2020 FINAL  Final  Culture, blood (routine x 2)     Status: None   Collection Time: 01/13/2020  1:00 PM   Specimen: BLOOD RIGHT HAND  Result Value Ref Range Status   Specimen Description BLOOD RIGHT HAND  Final   Special Requests   Final    BOTTLES DRAWN AEROBIC AND ANAEROBIC Blood Culture adequate volume   Culture   Final    NO GROWTH 5 DAYS Performed at [facility], [place] [address], [place], [place] [address]    Report Status 01/21/2020 FINAL  Final  MRSA PCR Screening     Status: None   Collection Time: 01/18/20 10:32 AM   Specimen: Nasal Mucosa; Nasopharyngeal  Result Value Ref Range Status   MRSA by PCR NEGATIVE  NEGATIVE Final    Comment:  The GeneXpert MRSA Assay (FDA approved for NASAL specimens only), is one component of a comprehensive MRSA colonization surveillance program. It is not intended to diagnose MRSA infection nor to guide or monitor treatment for MRSA infections. Performed at [facility], [place] [address], [place], [place] [address]          Radiology Studies: No results found.      Scheduled Meds: . citalopram  10 mg Oral Daily  . enoxaparin (LOVENOX) injection  40 mg Subcutaneous Q24H  . methylPREDNISolone (SOLU-MEDROL) injection  60 mg Intravenous Q24H  . metoprolol tartrate  12.5 mg Oral BID  . Nintedanib  150 mg Oral BID WC  . pantoprazole  80 mg Oral Q1200  . sodium chloride flush  10-40 mL Intracatheter Q12H  . umeclidinium bromide  1 puff Inhalation Daily   Continuous Infusions:    LOS: 8 days       Hosie Poisson, MD [facility]   To contact the attending provider between 7A-7P or the covering provider during after hours 7P-7A, please log into the web site [email or web address] and access using universal [place] password for that web site. If you do not have the password, please call the hospital operator.  01/24/2020, 11:12 AM

## 2020-01-24 NOTE — Telephone Encounter (Signed)
Jason Summers  Wife and son have questions. Could you please schedule a tele visit 01/24/2020 at 9.15am?  Thanks Geonna Lockyer

## 2020-01-24 NOTE — Addendum Note (Signed)
Addended by: Brand Males on: 01/24/2020 05:59 PM   Modules accepted: Level of Service

## 2020-01-24 NOTE — Progress Notes (Signed)
Occupational Therapy Treatment Patient Details Name: Jason Summers MRN: [id number] DOB: 03/13/1932 Today's Date: 01/24/2020    History of present illness 86 y.o. male presenting with worsening SOB x2-3 days and difficulty ambulating. Patient admitted with R middle lobe pneumonia. Patient with recent hospitalization from 7/21-7/28 with sepsis/acute hypoxic respiratory failure 2/2 IPF exacerbation and CAP. Since admission, patient with increased need for supplemental O2 of 10L via HFNC. PMHx significant for lung CA in remission s/p chemo and radiation tehrapy, ideopathic pulmonary fibrosis, HLD, depression/anxiety, and GERD.    OT comments  Pt pleasant and agreeable to working with OT. Pt continues to have overall weakness and decreased endurance, tolerating sitting EOB but unable to progress further given decrease in O2 sats despite increasing supplemental O2. Pt initially on 5-6L HFNC, with O2 sats initially decreasing to 84% with sitting upright; pt with rapid breathing noted throughout; much of session focus on deep breathing techniques however pt having difficulty with carryover despite max cues. Increased O2 to 8L HFNC while seated with SpO2 increasing to 86-88%. Once returned to supine and positioned with HOB slightly elevated Spo2 maintaining at 90-91% on 8L HFNC, RN made aware of increased O2 demand. Continue to recommend SNF at time of discharge and will continue to follow acutely to progress pt towards his PLOF.    Follow Up Recommendations  SNF;Supervision/Assistance - 24 hour    Equipment Recommendations  3 in 1 bedside commode          Precautions / Restrictions Precautions Precautions: Fall Precaution Comments: monitor SpO2 and RR Restrictions Weight Bearing Restrictions: No       Mobility Bed Mobility Overal bed mobility: Needs Assistance Bed Mobility: Supine to Sit;Sit to Supine     Supine to sit: Mod assist;+2 for safety/equipment Sit to supine: Mod assist;+2 for  physical assistance;+2 for safety/equipment   General bed mobility comments: pt able to initiate LEs over EOB with assist for trunk elevation  Transfers                 General transfer comment: deferred as pt with decreased SpO2 with bed mobility and unable to increase to >90% until after return back to supine    Balance Overall balance assessment: Needs assistance Sitting-balance support: No upper extremity supported;Feet supported Sitting balance-Leahy Scale: Fair Sitting balance - Comments: close minguard for balance                                    ADL either performed or assessed with clinical judgement   ADL Overall ADL's : Needs assistance/impaired                                       General ADL Comments: pt tolerating siting EOB approx 5-7 min but with rapid/shallow breathing throughout, much of time spent EOB on working on deep breathing techniques.                Cognition Arousal/Alertness: Awake/alert Behavior During Therapy: WFL for tasks assessed/performed Overall Cognitive Status: Within Functional Limits for tasks assessed                                 General Comments: very pleasant         Exercises  Shoulder Instructions       General Comments      Pertinent Vitals/ Pain       Pain Assessment: No/denies pain  Home Living                                          Prior Functioning/Environment              Frequency  Min 2X/week        Progress Toward Goals  OT Goals(current goals can now be found in the care plan section)  Progress towards OT goals: OT to reassess next treatment  Acute Rehab OT Goals Patient Stated Goal: To return home.  OT Goal Formulation: With patient Time For Goal Achievement: 02/02/20 Potential to Achieve Goals: Fair ADL Goals Pt Will Transfer to Toilet: with supervision;ambulating;bedside commode Pt Will Perform Toileting  - Clothing Manipulation and hygiene: with supervision;sitting/lateral leans;sit to/from stand Additional ADL Goal #1: Patient will recall and utilize 3 energy conservation techniques in prep for BADLs. Additional ADL Goal #2: Patient will maintain SpO2 >90% during BADLs in sitting/standing.  Plan Discharge plan remains appropriate    Co-evaluation                 AM-PAC OT "6 Clicks" Daily Activity     Outcome Measure   Help from another person eating meals?: None Help from another person taking care of personal grooming?: A Little Help from another person toileting, which includes using toliet, bedpan, or urinal?: A Lot Help from another person bathing (including washing, rinsing, drying)?: A Lot Help from another person to put on and taking off regular upper body clothing?: A Little Help from another person to put on and taking off regular lower body clothing?: A Lot 6 Click Score: 16    End of Session Equipment Utilized During Treatment: Oxygen  OT Visit Diagnosis: Muscle weakness (generalized) (M62.81)   Activity Tolerance Patient tolerated treatment well   Patient Left in bed;with call bell/phone within reach;with family/visitor present   Nurse Communication Mobility status (increase in O2 demand)        Time: 3832-9191 OT Time Calculation (min): 18 min  Charges: OT General Charges $OT Visit: 1 Visit OT Treatments $Therapeutic Activity: 8-22 mins  Jason Summers, OT Acute Rehabilitation Services Pager [phone] Office [phone]    Jason Summers 01/24/2020, 5:20 PM

## 2020-01-25 ENCOUNTER — Ambulatory Visit: Payer: Medicare Other | Admitting: Internal Medicine

## 2020-01-25 DIAGNOSIS — Z66 Do not resuscitate: Secondary | ICD-10-CM | POA: Diagnosis not present

## 2020-01-25 DIAGNOSIS — Z7189 Other specified counseling: Secondary | ICD-10-CM | POA: Diagnosis not present

## 2020-01-25 DIAGNOSIS — Z515 Encounter for palliative care: Secondary | ICD-10-CM | POA: Diagnosis not present

## 2020-01-25 DIAGNOSIS — R0600 Dyspnea, unspecified: Secondary | ICD-10-CM | POA: Diagnosis not present

## 2020-01-25 MED ORDER — ALPRAZOLAM 0.5 MG PO TABS: 0.5000 mg | ORAL_TABLET | Freq: Three times a day (TID) | ORAL | 0 refills | Status: AC | PRN

## 2020-01-25 MED ORDER — PREDNISONE 10 MG PO TABS: 30.0000 mg | ORAL_TABLET | Freq: Every day | ORAL | 0 refills | Status: AC

## 2020-01-25 MED ORDER — ALPRAZOLAM 0.5 MG PO TABS
0.5000 mg | ORAL_TABLET | Freq: Three times a day (TID) | ORAL | Status: DC
  Administered 2020-01-25 – 2020-01-29 (×13): 0.5 mg via ORAL
  Filled 2020-01-25 (×15): qty 1

## 2020-01-25 MED ORDER — PREDNISONE 20 MG PO TABS: 40.0000 mg | ORAL_TABLET | Freq: Every day | ORAL | 0 refills | Status: AC

## 2020-01-25 MED ORDER — PREDNISONE 20 MG PO TABS
40.0000 mg | ORAL_TABLET | Freq: Every day | ORAL | Status: AC
  Administered 2020-01-26 – 2020-01-28 (×3): 40 mg via ORAL
  Filled 2020-01-25 (×3): qty 2

## 2020-01-25 MED ORDER — LEVALBUTEROL HCL 0.63 MG/3ML IN NEBU: 0.6300 mg | INHALATION_SOLUTION | RESPIRATORY_TRACT | 12 refills | Status: AC | PRN

## 2020-01-25 NOTE — Progress Notes (Signed)
PROGRESS NOTE    Jason Summers  JSE:[id number] DOB: [birth year] DOA: 01/17/2020 PCP: [name], NP   Chief Complaint  Patient presents with  . Respiratory Distress    Brief Narrative:  86 year old male prior history of lung cancer in remission, s/p chemo and radiation therapy, idiopathic pulmonary fibrosis on chronic steroids, sinus tachycardia, hyperlipidemia, depression, anxiety, GERD presents to ED with worsening shortness of breath.  He was admitted for IPF exacerbation and community-acquired pneumonia from 7/21/202127/28/2021 and discharged home in stable condition on doxycycline.  But presents to ED with progression of shortness of breath.  Currently he is requiring up to 5 L of nasal cannula oxygen to keep sats greater than 90%.  Due to persistent hypoxia and increased oxygen requirement PCCM consulted for recommendations. He was started on IV solumedrol with improvement in the hypoxia. Completed the course of cefepime and weaning steroids. He is currently on 6 lit of [place] oxygen with sats about 93% and on ambulation they are in low 80's despite being on steroids and have completed a course of antibiotics. Unfortunately he might need high flow oxygen on discharge with slow tapering of steroids over a period of 2 to 3 weeks.  Dr Chase Caller their pulmonologist spoke with the patient's wife and discussed the poor prognosis, will need another meeting with the palliative care team to see if family wishes to further discuss goals of care and disposition.   Patient seen and examined at bedside.  Patient's wife was at likely Xanax to be scheduled instead of as needed.  Weaning his steroids at this time.  No new complaints.    Assessment & Plan:   Principal Problem:   Right middle lobe pneumonia Active Problems:   Gastroesophageal reflux disease   IPF (idiopathic pulmonary fibrosis) (HCC)   HTN (hypertension)   HLD (hyperlipidemia)   Depression with anxiety   Chronic hypoxemic respiratory  failure (HCC)   Elevated liver enzymes   Palliative care by specialist   Goals of care, counseling/discussion   DNR (do not resuscitate)   Dyspnea on exertion  Acute on chronic respiratory failure secondary to hospital-acquired pneumonia and IPF flareup  Pt completed 7 days of IV cefepime.  and PCCM added IV Solu-Medrol for the management of IPF flareup. Completed the course of cefepime. We have decreased solumedrol to 60 mg daily, and weaned his oxygen to 6 lit /min . Patient maintaining good sats on oxygen.   strep pneumonia and Legionella antigens are negative at this time. Plan to transition to oral prednisone 40 mg daily in 2 days and slow taper. Schedule steroids to be weaned off over a period of 2 weeks as per PCCM.  Continue with duo nebs. Blood cultures have been negative so far Patient currently denies any cough at this time. Pt afebrile and wbc count wnl. PT evaluation recommending SNF.  inview of poor progression, unable to wean him off the oxygen, pt's wife unable to care for the patient at home, poor prognosis,  palliative care consulted for goals of care , they were agreeable to DNR, but would like to attempt short term SNF first.  Request palliative care for repeat goals of care discussion.   Elevated liver enzymes Acute viral hepatitis panel is negative. Appears to be improving at this time. Recommend outpatient followup of the liver enzymes in one week .  We will discontinue lipitor.    Anxiety Xanax increased to 0.5 mg TID scheduled as pt's wife reports that he takes them TID AT HOME  and not PRN. .    Depression Continue Celexa.   GERD PPI   Essential hypertension Well controlled.  No changes in medications. Leukocytosis probably reactive.    DVT prophylaxis: (Lovenox) Code Status: DNR Family Communication:None at bedside.  Disposition:   Status is: Inpatient  Remains inpatient appropriate because:Unsafe d/c plan, IV treatments appropriate due to  intensity of illness or inability to take PO and Inpatient level of care appropriate due to severity of illness, unable to wean his oxygen. Family hasn't decided on SNF yet.    Dispo: The patient is from: Home              Anticipated d/c is to: SNF              Anticipated d/c date is: 2 days              Patient currently is not medically stable to d/c.       Consultants:   PCCM.   Palliative care     Procedures: none.    Antimicrobials: Anti-infectives (From admission, onward)   Start     Dose/Rate Route Frequency Ordered Stop   01/17/20 0000  vancomycin (VANCOREADY) IVPB 750 mg/150 mL  Status:  Discontinued        750 mg 150 mL/hr over 60 Minutes Intravenous Every 12 hours 01/17/2020 1119 01/18/20 1436   01/20/2020 2200  ceFEPIme (MAXIPIME) 2 g in sodium chloride 0.9 % 100 mL IVPB        2 g 200 mL/hr over 30 Minutes Intravenous Every 12 hours 01/08/2020 1342 01/22/20 2151   01/15/2020 1030  ceFEPIme (MAXIPIME) 2 g in sodium chloride 0.9 % 100 mL IVPB        2 g 200 mL/hr over 30 Minutes Intravenous  Once 01/24/2020 1016 01/22/2020 1232   01/28/2020 1030  vancomycin (VANCOREADY) IVPB 1500 mg/300 mL        1,500 mg 150 mL/hr over 120 Minutes Intravenous  Once 01/08/2020 1017 01/22/2020 1441         Subjective: No new events overnight. .  Objective: Vitals:   01/25/20 0438 01/25/20 0500 01/25/20 0803 01/25/20 1300  BP: 139/80  (!) 105/59 95/63  Pulse: 61  79 82  Resp: 18  20   Temp: 97.6 F (36.4 C)  98.6 F (37 C) 97.6 F (36.4 C)  TempSrc: Oral  Oral Oral  SpO2: 96%  90% 93%  Weight:  69.7 kg    Height:        Intake/Output Summary (Last 24 hours) at 01/25/2020 1353 Last data filed at 01/25/2020 1130 Gross per 24 hour  Intake 520 ml  Output 1725 ml  Net -1205 ml   Filed Weights   01/23/20 0357 01/24/20 0526 01/25/20 0500  Weight: 70.6 kg 70.4 kg 69.7 kg    Examination:  General exam: alert and comfortable.  Respiratory system: diminished air entry  throughout the lungs, tachypnea present.  Cardiovascular system: S1S2 heard, RRR no JVD, no pedal edema.  Gastrointestinal system: Abdomen is soft ,non tender non distended, bowel sounds wnl.  Central nervous system: Alert, oriented, grossly non focal.  Extremities: no pedal edema.  Skin: NO rashes seen.  Psychiatry: pt anxious    Data Reviewed: I have personally reviewed following labs and imaging studies  CBC: Recent Labs  Lab 01/20/20 0901 01/23/20 0155  WBC 9.2 10.6*  NEUTROABS 8.5* 9.1*  HGB 13.3 14.0  HCT 41.7 43.3  MCV 89.3 89.5  PLT 189 205  Basic Metabolic Panel: Recent Labs  Lab 01/20/20 0901 01/23/20 0155  NA 136 136  K 3.9 4.9  CL 96* 95*  CO2 31 34*  GLUCOSE 172* 95  BUN 21 27*  CREATININE 0.81 0.79  CALCIUM 8.6* 8.5*    GFR: Estimated Creatinine Clearance: 59.8 mL/min (by C-G formula based on SCr of 0.79 mg/dL).  Liver Function Tests: Recent Labs  Lab 01/23/20 0155  AST 23  ALT 51*  ALKPHOS 64  BILITOT 0.8  PROT 5.2*  ALBUMIN 2.5*    CBG: Recent Labs  Lab 01/23/20 0710  GLUCAP 82     Recent Results (from the past 240 hour(s))  SARS Coronavirus 2 by RT PCR (hospital order, performed in [facility] hospital lab) Nasopharyngeal Nasopharyngeal Swab     Status: None   Collection Time: 01/28/2020  9:54 AM   Specimen: Nasopharyngeal Swab  Result Value Ref Range Status   SARS Coronavirus 2 NEGATIVE NEGATIVE Final    Comment: (NOTE) SARS-CoV-2 target nucleic acids are NOT DETECTED.  The SARS-CoV-2 RNA is generally detectable in upper and lower respiratory specimens during the acute phase of infection. The lowest concentration of SARS-CoV-2 viral copies this assay can detect is 250 copies / mL. A negative result does not preclude SARS-CoV-2 infection and should not be used as the sole basis for treatment or other patient management decisions.  A negative result may occur with improper specimen collection / handling, submission of  specimen other than nasopharyngeal swab, presence of viral mutation(s) within the areas targeted by this assay, and inadequate number of viral copies (<250 copies / mL). A negative result must be combined with clinical observations, patient history, and epidemiological information.  Fact Sheet for Patients:   StrictlyIdeas.no  Fact Sheet for Healthcare Providers: BankingDealers.co.za  This test is not yet approved or  cleared by the [place] FDA and has been authorized for detection and/or diagnosis of SARS-CoV-2 by FDA under an Emergency Use Authorization (EUA).  This EUA will remain in effect (meaning this test can be used) for the duration of the COVID-19 declaration under Section 564(b)(1) of the Act, 21 U.S.C. section 360bbb-3(b)(1), unless the authorization is terminated or revoked sooner.  Performed at [facility], [place] [address] [place], [place] [address]   Culture, blood (routine x 2)     Status: None   Collection Time: 01/18/2020 12:45 PM   Specimen: BLOOD RIGHT ARM  Result Value Ref Range Status   Specimen Description BLOOD RIGHT ARM  Final   Special Requests   Final    BOTTLES DRAWN AEROBIC AND ANAEROBIC Blood Culture adequate volume   Culture   Final    NO GROWTH 5 DAYS Performed at [facility], [place] [address] [place], [place] [address]    Report Status 01/21/2020 FINAL  Final  Culture, blood (routine x 2)     Status: None   Collection Time: 02/05/2020  1:00 PM   Specimen: BLOOD RIGHT HAND  Result Value Ref Range Status   Specimen Description BLOOD RIGHT HAND  Final   Special Requests   Final    BOTTLES DRAWN AEROBIC AND ANAEROBIC Blood Culture adequate volume   Culture   Final    NO GROWTH 5 DAYS Performed at [facility], [place] [address] [place], [place] [address]    Report Status 01/21/2020 FINAL  Final  MRSA PCR Screening     Status: None   Collection Time: 01/18/20 10:32 AM  Specimen: Nasal Mucosa; Nasopharyngeal  Result Value Ref Range Status   MRSA by PCR NEGATIVE NEGATIVE Final    Comment:        The GeneXpert MRSA Assay (FDA approved for NASAL specimens only), is one component of a comprehensive MRSA colonization surveillance program. It is not intended to diagnose MRSA infection nor to guide or monitor treatment for MRSA infections. Performed at [facility], [place] [address], [place], [place] [address]          Radiology Studies: No results found.      Scheduled Meds: . ALPRAZolam  0.5 mg Oral TID  . citalopram  10 mg Oral Daily  . enoxaparin (LOVENOX) injection  40 mg Subcutaneous Q24H  . metoprolol tartrate  12.5 mg Oral BID  . Nintedanib  150 mg Oral BID WC  . pantoprazole  80 mg Oral Q1200  . [START ON 01/26/2020] predniSONE  40 mg Oral QAC breakfast  . sodium chloride flush  10-40 mL Intracatheter Q12H  . umeclidinium bromide  1 puff Inhalation Daily   Continuous Infusions:    LOS: 9 days       Hosie Poisson, MD [facility]   To contact the attending provider between 7A-7P or the covering provider during after hours 7P-7A, please log into the web site [email or web address] and access using universal [place] password for that web site. If you do not have the password, please call the hospital operator.  01/25/2020, 1:53 PM

## 2020-01-25 NOTE — Progress Notes (Signed)
PMT provider chart review and spoke with Dr. Karleen Hampshire and SW, Jason Summers. Also spoke with son, Jason Summers via telephone. He confirms decision that family would still like to pursue SNF placement. They are researching and in contact with acquaintances that they know at a few local facilities. Jason Summers does agree with outpatient palliative referral regardless of where his father discharges to, understanding this can transition to hospice services. Jason Summers has PMT contact information and understands he can call with questions or concerns. SW updated on this conversation.   NO CHARGE  Jason Summers, [place], FNP-C Palliative Medicine Team  Phone: [phone] Fax: [phone]

## 2020-01-25 NOTE — Progress Notes (Signed)
Physical Therapy Treatment Patient Details Name: Jason Summers MRN: [id number] DOB: 1931/11/22 Today's Date: 01/25/2020    History of Present Illness Pt is 86 y.o. male presenting with worsening SOB x2-3 days and difficulty ambulating. Patient admitted with R middle lobe pneumonia. Patient with recent hospitalization from 7/21-7/28 with sepsis/acute hypoxic respiratory failure 2/2 IPF exacerbation and CAP. Since admission, patient with increased need for supplemental O2 of 10L via HFNC. PMHx significant for lung CA in remission s/p chemo and radiation tehrapy, ideopathic pulmonary fibrosis, HLD, depression/anxiety, and GERD.    PT Comments    Pt with low activity tolerance.  He is on 6 LPM HFNC increased to 10 L HFNC for mobility with sats still decreasing to 78% and taking 5 mins to recover.  Pt with shallow rapid breaths requiring cues for relaxation and pursed lip breathing.  Cont to progress as able - however, noted family is having palliative conversations and realizes pt cannot tolerate intensive therapy.     Follow Up Recommendations  SNF (SNF vs comfort care pending families decision)     Equipment Recommendations  None recommended by PT    Recommendations for Other Services       Precautions / Restrictions Precautions Precautions: Fall Precaution Comments: monitor SpO2 and RR    Mobility  Bed Mobility Overal bed mobility: Needs Assistance Bed Mobility: Supine to Sit     Supine to sit: Min assist     General bed mobility comments: Min A to elevate trunk and scoot forward; cues for pursed lip breathing and slow deep breaths  Transfers Overall transfer level: Needs assistance Equipment used: Rolling walker (2 wheeled) Transfers: Sit to/from Stand Sit to Stand: Min assist         General transfer comment: Performed x 3; required signficant rest breaks to recover O2  Ambulation/Gait Ambulation/Gait assistance: Min assist Gait Distance (Feet): 3 Feet Assistive  device: Rolling walker (2 wheeled) Gait Pattern/deviations: Step-through pattern;Decreased stride length;Shuffle Gait velocity: decreased   General Gait Details: Steps to chair only; limited due to resp status and pt's tolerance; cues for RW and pursed lip breathing   Stairs             Wheelchair Mobility    Modified Rankin (Stroke Patients Only)       Balance Overall balance assessment: Needs assistance Sitting-balance support: No upper extremity supported;Feet supported Sitting balance-Leahy Scale: Fair     Standing balance support: Bilateral upper extremity supported Standing balance-Leahy Scale: Poor Standing balance comment: dependent on RW                            Cognition Arousal/Alertness: Awake/alert Behavior During Therapy: WFL for tasks assessed/performed Overall Cognitive Status: Within Functional Limits for tasks assessed                                 General Comments: wife present      Exercises      General Comments General comments (skin integrity, edema, etc.):   Pt on 6 L HFNC at arrival with sats 91%; Transferred to EOB and sats low 80's.  Increased to 10 L HFNC for remainder of treatment.  Sats down to 78% with transfer to chair taking at least 5 mins to recover with cues for relaxation, pursed lip breathing, and slow deep breaths.  RR as high as 42 during mobility.  Once settled in  chair and sats back to 90% decreased back to 6 LPM HFNC and sats remained stable.    Wife asking difference in SNF vs Hospice house - discussed depends on goals of care, rehab vs end of life, encouraged her to discuss with MD and family.  Noted palliative consult in chart has been speaking with family.        Pertinent Vitals/Pain Pain Assessment: No/denies pain    Home Living                      Prior Function            PT Goals (current goals can now be found in the care plan section) Acute Rehab PT  Goals Patient Stated Goal: To return home.  Time For Goal Achievement: 02/02/20 Potential to Achieve Goals: Poor Progress towards PT goals: Progressing toward goals    Frequency    Min 3X/week      PT Plan Current plan remains appropriate    Co-evaluation              AM-PAC PT "6 Clicks" Mobility   Outcome Measure  Help needed turning from your back to your side while in a flat bed without using bedrails?: A Little Help needed moving from lying on your back to sitting on the side of a flat bed without using bedrails?: A Little Help needed moving to and from a bed to a chair (including a wheelchair)?: A Little Help needed standing up from a chair using your arms (e.g., wheelchair or bedside chair)?: A Little Help needed to walk in hospital room?: A Little Help needed climbing 3-5 steps with a railing? : A Lot 6 Click Score: 17    End of Session Equipment Utilized During Treatment: Oxygen Activity Tolerance: Treatment limited secondary to medical complications (Comment) Patient left: with call bell/phone within reach;with family/visitor present;in chair Nurse Communication: Mobility status PT Visit Diagnosis: Difficulty in walking, not elsewhere classified (R26.2)     Time: 6384-6659 PT Time Calculation (min) (ACUTE ONLY): 26 min  Charges:  $Therapeutic Activity: 23-37 mins                     Abran Richard, PT Acute Rehab Services Pager [phone] [facility] [phone]     Karlton Lemon 01/25/2020, 1:46 PM

## 2020-01-25 NOTE — Progress Notes (Signed)
[name] brought a new bottle of nintedanib from home for pharmacy to dispense to pt during his admission.  RN delivered medication to pharmacy.  Home medication paper form signed on chart.

## 2020-01-25 NOTE — TOC Progression Note (Addendum)
Transition of Care [facility]) - Progression Note    Patient Details  Name: Jason Summers MRN: [id number] Date of Birth: 04-25-1932  Transition of Care [facility] CM/SW [place], [place] Phone Number: 01/25/2020, 2:03 PM  Clinical Narrative:      Called patient's son and advised that [facility] has confirmed bed offer- patient's son would like to discuss with patient's spouse and follow up with CSW. Family has been hesitant   Thurmond Butts, MSW, LCSWA Clinical Social Worker   Expected Discharge Plan: Skilled Nursing Facility Barriers to Discharge: Ship broker, Continued Medical Work up, SNF Pending bed offer  Expected Discharge Plan and Services Expected Discharge Plan: [place] In-house Referral: Clinical Social Work                                             Social Determinants of Health (SDOH) Interventions    Readmission Risk Interventions No flowsheet data found.

## 2020-01-26 DIAGNOSIS — R748 Abnormal levels of other serum enzymes: Secondary | ICD-10-CM | POA: Diagnosis not present

## 2020-01-26 DIAGNOSIS — J9611 Chronic respiratory failure with hypoxia: Secondary | ICD-10-CM | POA: Diagnosis not present

## 2020-01-26 DIAGNOSIS — F418 Other specified anxiety disorders: Secondary | ICD-10-CM | POA: Diagnosis not present

## 2020-01-26 DIAGNOSIS — Z66 Do not resuscitate: Secondary | ICD-10-CM | POA: Diagnosis not present

## 2020-01-26 NOTE — Plan of Care (Signed)
  Problem: Education: Goal: Knowledge of General Education information will improve Description: Including pain rating scale, medication(s)/side effects and non-pharmacologic comfort measures Outcome: Progressing   Problem: Activity: Goal: Ability to tolerate increased activity will improve Outcome: Progressing

## 2020-01-26 NOTE — Progress Notes (Signed)
[facility] PROGRESS NOTE    Progress Note  Jason Summers  WUJ:[id number] DOB: [birth year] DOA: 01/14/2020 PCP: [name], NP     Brief Narrative:   Jason Summers is an 86 y.o. male past medical history of lung cancer in remission, status post chemo and radiation therapy, idiopathic pulmonary fibrosis on chronic steroids, GERD who presents to the ED with worsening shortness of breath.  Patient was admitted with IPF exacerbation and community-acquired pneumonia discharged on 01/04/2020 during this time he was discharged on oxygen and doxycycline came back to the ED as he required 5 L of oxygen and barely keeping saturations in 90, PCCM was consulted recommended IV steroids and antibiotics. 6 L of oxygen, has completed his course of antibiotics, unfortunately he will require high flow oxygen to discharge home over the next 2 to 3 weeks with a slow taper of steroids. Pulmonary Dr. Chase Caller met with the patient and family and explained to them he has a poor prognosis.  And it would be a good idea to meet with palliative care.  Assessment/Plan:   Acute on chronic respiratory failure secondary to hospital-acquired pneumonia in the setting of IPA flareup: PCCM was consulted to complete her course of IV cefepime. He is currently on steroids and will have to decrease steroids slowly as an outpatient. He will also require 6 to 7 L of high flow nasal cannula at home to keep saturations stable. We will plan to transition to oral prednisone 40 mg in 1 day and from thereon taper slowly, he will need to follow-up with PCCM in 2 to 4 weeks. Blood cultures have been negative till date, physical therapy evaluated the patient the recommended skilled nursing facility. And is currently a DNR, he will have to go to skilled nursing facility short-term.  Elevated liver enzymes: Acute hepatitis panel negative, Lipitor was discontinued will need further work-up as an outpatient.  Anxiety: Continue Xanax 0.5  3 times daily as needed.  Depression: Continue Celexa.  GERD: Continue PPI.  Palliative care by specialist/ Goals of care, counseling/discussion Pulmonary spoke with the family and explained to them that the patient has a poor prognosis he agreed to meet with hospice and palliative care.  They do agree with outpatient palliative care referral. DNR (do not resuscitate)     DVT prophylaxis: lovenox Family Communication:none Status is: Inpatient  Remains inpatient appropriate because:Unsafe d/c plan   Dispo: The patient is from: Home              Anticipated d/c is to: SNF              Anticipated d/c date is: 1 day              Patient currently is medically stable to d/c.  Patient can probably be discharged in 24 to 48 hours, will change steroids to orals tomorrow after this we can start thinking about discharging him to skilled nursing facility depending on the hospice and palliative care conversation outcome.     Code Status:     Code Status Orders  (From admission, onward)         Start     Ordered   01/20/20 0947  Do not attempt resuscitation (DNR)  Continuous       Question Answer Comment  In the event of cardiac or respiratory ARREST Do not call a "code blue"   In the event of cardiac or respiratory ARREST Do not perform Intubation, CPR, defibrillation or ACLS  In the event of cardiac or respiratory ARREST Use medication by any route, position, wound care, and other measures to relive pain and suffering. May use oxygen, suction and manual treatment of airway obstruction as needed for comfort.      01/20/20 0946        Code Status History    Date Active Date Inactive Code Status Order ID Comments User Context   01/24/2020 1230 01/20/2020 0946 Full Code [id number]  Mckinley Jewel, MD ED   12/28/2019 1330 01/04/2020 2304 Partial Code [id number]  Harold Hedge, MD Inpatient   10/06/2019 1043 10/06/2019 1635 Full Code [id number]  End, Harrell Gave, MD Inpatient   Advance  Care Planning Activity        IV Access:    Peripheral IV   Procedures and diagnostic studies:   No results found.   Medical Consultants:    None.  Anti-Infectives:   None  Subjective:    Jason Summers relates his breathing is unchanged, also short of breath with minimal ambulation.  Objective:    Vitals:   01/26/20 0300 01/26/20 0351 01/26/20 0600 01/26/20 0817  BP:  122/78  107/65  Pulse: 69 68 68 91  Resp:  19  19  Temp:  97.7 F (36.5 C)  (!) 97.5 F (36.4 C)  TempSrc:  Oral  Oral  SpO2: (!) 89% (S) 92% 99% 95%  Weight:  69.7 kg    Height:       SpO2: 95 % O2 Flow Rate (L/min): 8 L/min   Intake/Output Summary (Last 24 hours) at 01/26/2020 0858 Last data filed at 01/26/2020 0300 Gross per 24 hour  Intake 370 ml  Output 575 ml  Net -205 ml   Filed Weights   01/24/20 0526 01/25/20 0500 01/26/20 0351  Weight: 70.4 kg 69.7 kg 69.7 kg    Exam: General exam: In no acute distress. Respiratory system: Good air movement with diffuse crackles bilaterally. Cardiovascular system: S1 & S2 heard, RRR.  Gastrointestinal system: Abdomen is nondistended, soft and nontender.  Extremities: No pedal edema. Skin: No rashes, lesions or ulcers Psychiatry: Judgement and insight appear normal. Mood & affect appropriate.    Data Reviewed:    Labs: Basic Metabolic Panel: Recent Labs  Lab 01/20/20 0901 01/23/20 0155  NA 136 136  K 3.9 4.9  CL 96* 95*  CO2 31 34*  GLUCOSE 172* 95  BUN 21 27*  CREATININE 0.81 0.79  CALCIUM 8.6* 8.5*   GFR Estimated Creatinine Clearance: 59.8 mL/min (by C-G formula based on SCr of 0.79 mg/dL). Liver Function Tests: Recent Labs  Lab 01/23/20 0155  AST 23  ALT 51*  ALKPHOS 64  BILITOT 0.8  PROT 5.2*  ALBUMIN 2.5*   No results for input(s): LIPASE, AMYLASE in the last 168 hours. No results for input(s): AMMONIA in the last 168 hours. Coagulation profile No results for input(s): INR, PROTIME in the last 168  hours. COVID-19 Labs  No results for input(s): DDIMER, FERRITIN, LDH, CRP in the last 72 hours.  Lab Results  Component Value Date   SARSCOV2NAA NEGATIVE 01/12/2020   [place] NEGATIVE 12/28/2019   [place] NEGATIVE 10/03/2019   [place] NEGATIVE 09/30/2019    CBC: Recent Labs  Lab 01/20/20 0901 01/23/20 0155  WBC 9.2 10.6*  NEUTROABS 8.5* 9.1*  HGB 13.3 14.0  HCT 41.7 43.3  MCV 89.3 89.5  PLT 189 205   Cardiac Enzymes: No results for input(s): CKTOTAL, CKMB, CKMBINDEX, TROPONINI in the last 168 hours. BNP (  last 3 results) Recent Labs    09/19/19 1105  PROBNP 275   CBG: Recent Labs  Lab 01/23/20 0710  GLUCAP 82   D-Dimer: No results for input(s): DDIMER in the last 72 hours. Hgb A1c: No results for input(s): HGBA1C in the last 72 hours. Lipid Profile: No results for input(s): CHOL, HDL, LDLCALC, TRIG, CHOLHDL, LDLDIRECT in the last 72 hours. Thyroid function studies: No results for input(s): TSH, T4TOTAL, T3FREE, THYROIDAB in the last 72 hours.  Invalid input(s): FREET3 Anemia work up: No results for input(s): VITAMINB12, FOLATE, FERRITIN, TIBC, IRON, RETICCTPCT in the last 72 hours. Sepsis Labs: Recent Labs  Lab 01/20/20 0901 01/23/20 0155  WBC 9.2 10.6*   Microbiology Recent Results (from the past 240 hour(s))  SARS Coronavirus 2 by RT PCR (hospital order, performed in [facility] hospital lab) Nasopharyngeal Nasopharyngeal Swab     Status: None   Collection Time: 01/10/2020  9:54 AM   Specimen: Nasopharyngeal Swab  Result Value Ref Range Status   SARS Coronavirus 2 NEGATIVE NEGATIVE Final    Comment: (NOTE) SARS-CoV-2 target nucleic acids are NOT DETECTED.  The SARS-CoV-2 RNA is generally detectable in upper and lower respiratory specimens during the acute phase of infection. The lowest concentration of SARS-CoV-2 viral copies this assay can detect is 250 copies / mL. A negative result does not preclude SARS-CoV-2 infection and should  not be used as the sole basis for treatment or other patient management decisions.  A negative result may occur with improper specimen collection / handling, submission of specimen other than nasopharyngeal swab, presence of viral mutation(s) within the areas targeted by this assay, and inadequate number of viral copies (<250 copies / mL). A negative result must be combined with clinical observations, patient history, and epidemiological information.  Fact Sheet for Patients:   StrictlyIdeas.no  Fact Sheet for Healthcare Providers: BankingDealers.co.za  This test is not yet approved or  cleared by the [place] FDA and has been authorized for detection and/or diagnosis of SARS-CoV-2 by FDA under an Emergency Use Authorization (EUA).  This EUA will remain in effect (meaning this test can be used) for the duration of the COVID-19 declaration under Section 564(b)(1) of the Act, 21 U.S.C. section 360bbb-3(b)(1), unless the authorization is terminated or revoked sooner.  Performed at [facility], [place] [address] [place], [place] [address]   Culture, blood (routine x 2)     Status: None   Collection Time: 01/28/2020 12:45 PM   Specimen: BLOOD RIGHT ARM  Result Value Ref Range Status   Specimen Description BLOOD RIGHT ARM  Final   Special Requests   Final    BOTTLES DRAWN AEROBIC AND ANAEROBIC Blood Culture adequate volume   Culture   Final    NO GROWTH 5 DAYS Performed at [facility], [place] [address], [place], [place] [address]    Report Status 01/21/2020 FINAL  Final  Culture, blood (routine x 2)     Status: None   Collection Time: 01/13/2020  1:00 PM   Specimen: BLOOD RIGHT HAND  Result Value Ref Range Status   Specimen Description BLOOD RIGHT HAND  Final   Special Requests   Final    BOTTLES DRAWN AEROBIC AND ANAEROBIC Blood Culture adequate volume   Culture   Final    NO GROWTH 5 DAYS Performed at [facility], [place] [address] [place],  [address]    Report Status 01/21/2020 FINAL  Final  MRSA PCR  Screening     Status: None   Collection Time: 01/18/20 10:32 AM   Specimen: Nasal Mucosa; Nasopharyngeal  Result Value Ref Range Status   MRSA by PCR NEGATIVE NEGATIVE Final    Comment:        The GeneXpert MRSA Assay (FDA approved for NASAL specimens only), is one component of a comprehensive MRSA colonization surveillance program. It is not intended to diagnose MRSA infection nor to guide or monitor treatment for MRSA infections. Performed at [facility], [place] [address], [place], [place] [address]      Medications:   . ALPRAZolam  0.5 mg Oral TID  . citalopram  10 mg Oral Daily  . enoxaparin (LOVENOX) injection  40 mg Subcutaneous Q24H  . metoprolol tartrate  12.5 mg Oral BID  . Nintedanib  150 mg Oral BID WC  . pantoprazole  80 mg Oral Q1200  . predniSONE  40 mg Oral QAC breakfast  . sodium chloride flush  10-40 mL Intracatheter Q12H  . umeclidinium bromide  1 puff Inhalation Daily   Continuous Infusions:    LOS: 10 days   Charlynne Cousins  [facility]  01/26/2020, 8:58 AM

## 2020-01-26 NOTE — TOC Progression Note (Signed)
Transition of Care [facility]) - Progression Note    Patient Details  Name: Jason Summers MRN: [id number] Date of Birth: [birth year]  Transition of [facility]) CM/SW [place], [place] Phone Number: 01/26/2020, 11:47 AM  Clinical Narrative:    CSW spoke with [name] [phone].  Pt's son will discuss SNF options with pt's spouse today.  CSW offered to give pt's son SNF's acceptance choices.  Pt son said he had those choices and his mother will be making the choice.  CSW asked if pt's son can give SNF choice today so bed will bed will not be lost at [facility]. Pt's son called CSW.  Pt's  Spouse is not interested  in [facility] at this time.  Expected Discharge Plan: Skilled Nursing Facility Barriers to Discharge: Ship broker, Continued Medical Work up, SNF Pending bed offer  Expected Discharge Plan and Services Expected Discharge Plan: [place] In-house Referral: Clinical Social Work                                             Social Determinants of Health (SDOH) Interventions    Readmission Risk Interventions No flowsheet data found.

## 2020-01-26 NOTE — Progress Notes (Signed)
Room air SPO2 79%.  At night, Pt's a mouth breather, with HFNCL 8LPM, SPO2 87-92% When awake and alert, SPO2 98% on 8LPM,  Dyspnea with excerption when ambulated to bedside commode. Auscultated, diminished breath sound on left upper and lower lung. Right lung clear upper, rhonchi and fine crackles on right lung base.  NSR on monitor, HR 60s, BP within normal limits. RR 19-24.  Continue to monitor.  Kennyth Lose, RN

## 2020-01-27 NOTE — Progress Notes (Signed)
Hydrologist [facility])  Hospital Liaison: RN note         Notified that family wanted information on Hospice vs Palliative Care. Spoke with family at patient bedside to explain.         [facility] Palliative team will follow up with patient at facility after discharge.       Please call with any hospice or palliative related questions.         Thank you for this referral.         Farrel Gordon, RN, CCM  [facility] (listed on [place] under Hospice[facility])    [phone]

## 2020-01-27 NOTE — Progress Notes (Signed)
[facility] PROGRESS NOTE    Progress Note  Franco Dusenbery  FGH:[id number] DOB: 30-Aug-1931 DOA: 01/08/2020 PCP: [name], NP     Brief Narrative:   Catlin Aycock is an 86 y.o. male past medical history of lung cancer in remission, status post chemo and radiation therapy, idiopathic pulmonary fibrosis on chronic steroids, GERD who presents to the ED with worsening shortness of breath.  Patient was admitted with IPF exacerbation and community-acquired pneumonia discharged on 01/04/2020 during this time he was discharged on oxygen and doxycycline came back to the ED as he required 5 L of oxygen and barely keeping saturations in 90, PCCM was consulted recommended IV steroids and antibiotics. 6 L of oxygen, has completed his course of antibiotics, unfortunately he will require high flow oxygen to discharge home over the next 2 to 3 weeks with a slow taper of steroids. Pulmonary Dr. Chase Caller met with the patient and family and explained to them he has a poor prognosis.  And it would be a good idea to meet with palliative care.  Assessment/Plan:   Acute on chronic respiratory failure secondary to hospital-acquired pneumonia in the setting of IPA flareup: PCCM was consulted to complete her course of IV cefepime. He is currently on steroids and will have to decrease steroids slowly as an outpatient. He will also require 6 to 7 L of high flow nasal cannula at home to keep saturations stable. We will plan to transition to oral prednisone 40 mg in 1 day and from thereon taper slowly, he will need to follow-up with PCCM in 2 to 4 weeks. Blood cultures have been negative till date, physical therapy evaluated the patient the recommended skilled nursing facility. And is currently a DNR, he will need to go to skilled nursing facility awaiting insurance authorization.  Elevated liver enzymes: Acute hepatitis panel negative, Lipitor was discontinued will need further work-up as an  outpatient.  Anxiety: Continue Xanax 0.5 3 times daily as needed.  Depression: Continue Celexa.  GERD: Continue PPI.  Palliative care by specialist/ Goals of care, counseling/discussion Pulmonary spoke with the family and explained to them that the patient has a poor prognosis he agreed to meet with hospice and palliative care.  They do agree with outpatient palliative care referral. DNR (do not resuscitate)     DVT prophylaxis: lovenox Family Communication:none Status is: Inpatient  Remains inpatient appropriate because:Unsafe d/c plan   Dispo: The patient is from: Home              Anticipated d/c is to: SNF              Anticipated d/c date is: 1 day              Patient currently is medically stable to d/c.  We are awaiting insurance authorization to be discharged to skilled nursing facility.  Palliative Care to follow-up at facility.  Steroids have been transitioned to oral which will continue at 40 mg daily for 2 additional days then taper down slowly.     Code Status:     Code Status Orders  (From admission, onward)         Start     Ordered   01/20/20 0947  Do not attempt resuscitation (DNR)  Continuous       Question Answer Comment  In the event of cardiac or respiratory ARREST Do not call a "code blue"   In the event of cardiac or respiratory ARREST Do not perform Intubation, CPR,  defibrillation or ACLS   In the event of cardiac or respiratory ARREST Use medication by any route, position, wound care, and other measures to relive pain and suffering. May use oxygen, suction and manual treatment of airway obstruction as needed for comfort.      01/20/20 0946        Code Status History    Date Active Date Inactive Code Status Order ID Comments User Context   01/24/2020 1230 01/20/2020 0946 Full Code [id number]  Mckinley Jewel, MD ED   12/28/2019 1330 01/04/2020 2304 Partial Code [id number]  Harold Hedge, MD Inpatient   10/06/2019 1043 10/06/2019 1635 Full Code  [id number]  End, Harrell Gave, MD Inpatient   Advance Care Planning Activity        IV Access:    Peripheral IV   Procedures and diagnostic studies:   No results found.   Medical Consultants:    None.  Anti-Infectives:   None  Subjective:    Oneill Sowder no complaints.  Objective:    Vitals:   01/26/20 2045 01/27/20 0109 01/27/20 0449 01/27/20 0731  BP: 112/66 117/79 113/76   Pulse: 91 66 70   Resp: 20  20   Temp: 97.6 F (36.4 C) 98.2 F (36.8 C) 97.6 F (36.4 C)   TempSrc: Oral Oral Oral   SpO2: 94% 96% 95% 96%  Weight:      Height:       SpO2: 96 % O2 Flow Rate (L/min): 4 L/min   Intake/Output Summary (Last 24 hours) at 01/27/2020 0755 Last data filed at 01/26/2020 1500 Gross per 24 hour  Intake --  Output 450 ml  Net -450 ml   Filed Weights   01/24/20 0526 01/25/20 0500 01/26/20 0351  Weight: 70.4 kg 69.7 kg 69.7 kg    Exam: General exam: In no acute distress. Respiratory system: Good air movement with diffuse crackles bilaterally. Cardiovascular system: S1 & S2 heard, RRR.  Gastrointestinal system: Abdomen is nondistended, soft and nontender.  Extremities: No pedal edema. Skin: No rashes, lesions or ulcers Psychiatry: Judgement and insight appear normal. Mood & affect appropriate.    Data Reviewed:    Labs: Basic Metabolic Panel: Recent Labs  Lab 01/20/20 0901 01/23/20 0155  NA 136 136  K 3.9 4.9  CL 96* 95*  CO2 31 34*  GLUCOSE 172* 95  BUN 21 27*  CREATININE 0.81 0.79  CALCIUM 8.6* 8.5*   GFR Estimated Creatinine Clearance: 59.8 mL/min (by C-G formula based on SCr of 0.79 mg/dL). Liver Function Tests: Recent Labs  Lab 01/23/20 0155  AST 23  ALT 51*  ALKPHOS 64  BILITOT 0.8  PROT 5.2*  ALBUMIN 2.5*   No results for input(s): LIPASE, AMYLASE in the last 168 hours. No results for input(s): AMMONIA in the last 168 hours. Coagulation profile No results for input(s): INR, PROTIME in the last 168 hours. COVID-19  Labs  No results for input(s): DDIMER, FERRITIN, LDH, CRP in the last 72 hours.  Lab Results  Component Value Date   SARSCOV2NAA NEGATIVE 02/03/2020   [place] NEGATIVE 12/28/2019   [place] NEGATIVE 10/03/2019   [place] NEGATIVE 09/30/2019    CBC: Recent Labs  Lab 01/20/20 0901 01/23/20 0155  WBC 9.2 10.6*  NEUTROABS 8.5* 9.1*  HGB 13.3 14.0  HCT 41.7 43.3  MCV 89.3 89.5  PLT 189 205   Cardiac Enzymes: No results for input(s): CKTOTAL, CKMB, CKMBINDEX, TROPONINI in the last 168 hours. BNP (last 3 results) Recent Labs  09/19/19 1105  PROBNP 275   CBG: Recent Labs  Lab 01/23/20 0710  GLUCAP 82   D-Dimer: No results for input(s): DDIMER in the last 72 hours. Hgb A1c: No results for input(s): HGBA1C in the last 72 hours. Lipid Profile: No results for input(s): CHOL, HDL, LDLCALC, TRIG, CHOLHDL, LDLDIRECT in the last 72 hours. Thyroid function studies: No results for input(s): TSH, T4TOTAL, T3FREE, THYROIDAB in the last 72 hours.  Invalid input(s): FREET3 Anemia work up: No results for input(s): VITAMINB12, FOLATE, FERRITIN, TIBC, IRON, RETICCTPCT in the last 72 hours. Sepsis Labs: Recent Labs  Lab 01/20/20 0901 01/23/20 0155  WBC 9.2 10.6*   Microbiology Recent Results (from the past 240 hour(s))  MRSA PCR Screening     Status: None   Collection Time: 01/18/20 10:32 AM   Specimen: Nasal Mucosa; Nasopharyngeal  Result Value Ref Range Status   MRSA by PCR NEGATIVE NEGATIVE Final    Comment:        The GeneXpert MRSA Assay (FDA approved for NASAL specimens only), is one component of a comprehensive MRSA colonization surveillance program. It is not intended to diagnose MRSA infection nor to guide or monitor treatment for MRSA infections. Performed at [facility], [place] [address] [place], [place] [address]      Medications:   . ALPRAZolam  0.5 mg Oral TID  . citalopram  10 mg Oral Daily  . enoxaparin (LOVENOX) injection  40  mg Subcutaneous Q24H  . metoprolol tartrate  12.5 mg Oral BID  . Nintedanib  150 mg Oral BID WC  . pantoprazole  80 mg Oral Q1200  . predniSONE  40 mg Oral QAC breakfast  . sodium chloride flush  10-40 mL Intracatheter Q12H  . umeclidinium bromide  1 puff Inhalation Daily   Continuous Infusions:    LOS: 11 days   Charlynne Cousins  [facility]  01/27/2020, 7:55 AM

## 2020-01-27 NOTE — TOC Progression Note (Addendum)
Transition of Care [facility]) - Progression Note    Patient Details  Name: Jason Summers MRN: [id number] Date of Birth: 1932-05-23  Transition of Care [facility]) CM/SW [place], [place] Phone Number: 01/27/2020, 11:51 AM  Clinical Narrative:    4:45p CSW received follow-up call from [facility] [place]. Alex expressed they are not currently taking any new admits at this time due to covid. Alex stated he expressed this to the family this morning. CSW met with patient and spouse bedside to discuss discharge plan. CSW explained other options will need to be pursued for patient's discharge. Patient's spouse refused to choose a SNF, stating she will not send patient to a facility that she can not see. Spouse contacted her daughter Thurman Coyer on speaker phone. CSW explained to patient's daughter that another plan is needed for patient to discharge. CSW explained 7 SNFs have made an offer. Daughter asked for the name of the SNFs, CSW informed daughter the placements were provided to the family at the start of the week and inquired on which SNFs the family have visited. Daughter stated spouse does not want [facility] or [facility]. She stated she will discuss with her mother tonight.   4:00p CSW contacted patient's spouse to follow-up on the long-term placement she stated they are working on with [facility]. Patient's spouse expressed they have not sent everything over as of yet. CSW explained a secondary option needs to be complete considering there is no exact timeline for the long-term placement and patient is medically ready. Patient's spouse stated going home is not an option and she will not send patient to [facility]. CSW again explained that patient needs another option while the long-term placement is being completed. Spouse deferred stating they will have the paperwork completed and patient can leave no later than Monday.  CSW contacted [facility] to inquire on the timeframe for  long-term placement. CSW left a message with Cristie Hem, awaiting a callback. CSW provided Dr. Olevia Bowens with an update.  11:50a CSW contacted patient's spouse Mickel Baas to discuss SNF choice. Spouse stated they have been looking at facilities and contacted [facility] for long-term placement. CSW explained that insurance will cover short-term SNF placement and [facility] is currently not admitting patient's for short-term rehab. Spouse put her daughter Thurman Coyer on the phone. Thurman Coyer stated they will pay out of pocket and they have all the documentation necessary to get long-term care setup with [facility]. CSW explained patient may still need a short-term plan, until plans with [facility] are finalized. Thurman Coyer stated they are completing the paperwork today to send back to [facility]. CSW agreed to follow-up with family later in the day.   Expected Discharge Plan: Skilled Nursing Facility Barriers to Discharge: Ship broker, Continued Medical Work up, SNF Pending bed offer  Expected Discharge Plan and Services Expected Discharge Plan: [place] In-house Referral: Clinical Social Work                                             Social Determinants of Health (SDOH) Interventions    Readmission Risk Interventions No flowsheet data found.

## 2020-01-27 NOTE — Plan of Care (Signed)
  Problem: Education: Goal: Knowledge of General Education information will improve Description: Including pain rating scale, medication(s)/side effects and non-pharmacologic comfort measures Outcome: Progressing   Problem: Activity: Goal: Ability to tolerate increased activity will improve Outcome: Progressing

## 2020-01-27 NOTE — Progress Notes (Signed)
Physical Therapy Treatment Patient Details Name: Jason Summers MRN: [id number] DOB: Nov 28, 1931 Today's Date: 01/27/2020    History of Present Illness Pt is 86 y.o. male presenting with worsening SOB x2-3 days and difficulty ambulating. Patient admitted with R middle lobe pneumonia. Patient with recent hospitalization from 7/21-7/28 with sepsis/acute hypoxic respiratory failure 2/2 IPF exacerbation and CAP. Since admission, patient with increased need for supplemental O2 of 10L via HFNC. PMHx significant for lung CA in remission s/p chemo and radiation tehrapy, ideopathic pulmonary fibrosis, HLD, depression/anxiety, and GERD.    PT Comments    Pt with limited progress. Limited due to respiratory status.  Noted pt with palliative consult - current plan is for d/c to SNF.  With any activity pt becomes very dyspneic with sats dropping to low 80's.  Pt with poor prognosis.  Will continue to progress therapy as able.     Follow Up Recommendations  SNF     Equipment Recommendations  None recommended by PT    Recommendations for Other Services       Precautions / Restrictions Precautions Precautions: Fall Precaution Comments: monitor SpO2 and RR    Mobility  Bed Mobility Overal bed mobility: Needs Assistance Bed Mobility: Supine to Sit     Supine to sit: Min assist     General bed mobility comments: Min A to elevate trunk and scoot forward; cues for pursed lip breathing and slow deep breaths  Transfers Overall transfer level: Needs assistance Equipment used: Rolling walker (2 wheeled) Transfers: Sit to/from Stand Sit to Stand: Min assist         General transfer comment: Performed x 3; required signficant rest breaks to recover O2  Ambulation/Gait Ambulation/Gait assistance: Min assist Gait Distance (Feet): 3 Feet (3'x3) Assistive device: Rolling walker (2 wheeled) Gait Pattern/deviations: Step-through pattern;Decreased stride length;Shuffle Gait velocity: decreased    General Gait Details: Steps to chair, then bsc, then back to chair; limited due to resp status and pt's tolerance; cues for RW and pursed lip breathing; frequent extended rest breaks   Stairs             Wheelchair Mobility    Modified Rankin (Stroke Patients Only)       Balance Overall balance assessment: Needs assistance Sitting-balance support: No upper extremity supported;Feet supported Sitting balance-Leahy Scale: Fair Sitting balance - Comments: supervision for safety   Standing balance support: Bilateral upper extremity supported Standing balance-Leahy Scale: Poor Standing balance comment: Required RW; required assist with toielting ADLs in standing - held RW                            Cognition Arousal/Alertness: Awake/alert Behavior During Therapy: Flat affect Overall Cognitive Status: Within Functional Limits for tasks assessed                                 General Comments: Pt states "I'm very sick, I'm going to die soon."      Exercises      General Comments General comments (skin integrity, edema, etc.): Pt on 5 L HFNC at arrival with sats 91%.  Increased to 8 L HFNC for therapy.  Sats would decrease to the low 80's requiring 3-5 mins to recover.  Required frequent cues for pursed lip breathing and slow deep breaths.  Returned back to 5 L at end of session with sats 90%.  Pertinent Vitals/Pain Pain Assessment: No/denies pain    Home Living                      Prior Function            PT Goals (current goals can now be found in the care plan section) Acute Rehab PT Goals Patient Stated Goal: To return home.  PT Goal Formulation: With patient Time For Goal Achievement: 02/02/20 Potential to Achieve Goals: Poor Progress towards PT goals: Not progressing toward goals - comment (due to resp status)    Frequency    Min 2X/week      PT Plan Frequency needs to be updated    Co-evaluation               AM-PAC PT "6 Clicks" Mobility   Outcome Measure  Help needed turning from your back to your side while in a flat bed without using bedrails?: A Little Help needed moving from lying on your back to sitting on the side of a flat bed without using bedrails?: A Little Help needed moving to and from a bed to a chair (including a wheelchair)?: A Little Help needed standing up from a chair using your arms (e.g., wheelchair or bedside chair)?: A Little Help needed to walk in hospital room?: A Little Help needed climbing 3-5 steps with a railing? : A Lot 6 Click Score: 17    End of Session Equipment Utilized During Treatment: Oxygen Activity Tolerance: Treatment limited secondary to medical complications (Comment) (Pt with severe DOE and decreased O2 sats) Patient left: with call bell/phone within reach;in chair;with chair alarm set Nurse Communication: Mobility status;Other (comment) (used BSC (Bm and urinated)) PT Visit Diagnosis: Difficulty in walking, not elsewhere classified (R26.2);Muscle weakness (generalized) (M62.81)     Time: 1410-1435 PT Time Calculation (min) (ACUTE ONLY): 25 min  Charges:  $Therapeutic Activity: 23-37 mins                     Abran Richard, PT Acute Rehab Services Pager [phone] [facility] 01/27/2020, 3:39 PM

## 2020-01-28 DIAGNOSIS — E7800 Pure hypercholesterolemia, unspecified: Secondary | ICD-10-CM

## 2020-01-28 NOTE — Progress Notes (Signed)
PROGRESS NOTE    Jason Summers  XTG:[id number] DOB: 11/23/1931 DOA: 01/14/2020 PCP: Jason Peng, NP     Brief Narrative:   86 y.o. WM PMHx  lung cancer-in remission, post chemo and radiation therapy, idiopathic pulmonary fibrosis on chronic steroids andNintendanib, sinus tachycardia, hyperlipidemia, depression/anxiety, GERD   Presents to emergency department with worsening shortness of breath since couple of days.  Patient's wife at the bedside tells me that patient was hospitalized from 12/28/2019 to 01/04/2020 with sepsis/acute hypoxic respiratory failure suspect secondary to IPF exacerbation and CAP and discharged home in a stable condition on p.o. doxy.  Patient's wife tells me that since his discharge from the hospital patient has progressive worsening shortness of breath, he is unable to walk,and this morning he was unable to catch his breath therefore she called EMS and brought patient to the emergency department for further evaluation and management.   Patient's wife tells me that patient had tachycardia at home therefore his PCP started him on metoprolol 12.5 mg twice daily which helps with his tachycardia.  His prednisone dose was also increased from 15 mg to 30 mg.  Patient has chronic productive cough with white phlegm however denies change in phlegm color, worsening of cough, wheezing, chest pain, leg swelling, orthopnea, PND, fever, chills, nausea, vomiting, headache, blurry vision, decreased appetite, abdominal pain.  Reports loose stools since last 2 days however denies melena.  No history of smoking, alcohol, illicit drug use.  He uses 4 L of oxygen at baseline at home.  ED Course: Upon arrival to ED: Patient tachypneic, requiring 4 L of oxygen via nasal cannula which is his baseline, afebrile with no leukocytosis.  Initial labs including CBC: WNL CMP shows elevated liver enzymes of AST: 109, ALT: 144, BNP: 114, COVID-19 negative, chest x-ray shows chronic lung disease with  suspected superimposed right midlung pneumonia.  Patient was given IV Vanco and cefepime for the concern of hospital-acquired pneumonia.  [facility] hospitalist consulted for    Subjective: Afebrile overnight,/O x4, states he understands the plan is to go home with hospice when available.    Assessment & Plan: Covid vaccination;   Principal Problem:   Right middle lobe pneumonia Active Problems:   Gastroesophageal reflux disease   IPF (idiopathic pulmonary fibrosis) (HCC)   HTN (hypertension)   HLD (hyperlipidemia)   Depression with anxiety   Chronic hypoxemic respiratory failure (HCC)   Elevated liver enzymes   Palliative care by specialist   Goals of care, counseling/discussion   DNR (do not resuscitate)   Dyspnea on exertion   Acute on chronic respiratory failure with hypoxia/HCAP/IPF flareup: -PCCM was consulted to complete his  course of IV cefepime. -Continue steroid taper  -O2 demands continue to increase in -Patient to go home on home hospice.  Initially patient was to follow-up with PCCM in 2 to 4 weeks unlikely to survive that long.  Elevated liver enzymes: -Acute hepatitis panel negative, Lipitor was discontinued will need further work-up as an outpatient.  Anxiety: Continue Xanax 0.5 3 times daily as needed.  Depression: Continue Celexa.  GERD: Continue PPI.  Palliative care by specialist/ Goals of care, counseling/discussion Pulmonary spoke with the family and explained to them that the patient has a poor prognosis he agreed to meet with hospice and palliative care.  They do agree with outpatient palliative care referral. DNR (do not resuscitate) -8/21 LCSW Jason Summers spoke with spouse and they have decided to proceed with SNF placement.  Specifically [facility].  DVT prophylaxis: Lovenox Code Status: DNR Family Communication:  Status is: Inpatient    Dispo: The patient is from: Home              Anticipated d/c is to: [facility]               Anticipated d/c date is: Next 24 to 48 hours              Patient currently unstable      Consultants:  PCCM  Procedures/Significant Events:    I have personally reviewed and interpreted all radiology studies and my findings are as above.  VENTILATOR SETTINGS: HFNC 8/21 Flow; 10 L/min SPO2 92%   Cultures   Antimicrobials:    Devices    LINES / TUBES:      Continuous Infusions:   Objective: Vitals:   01/27/20 2005 01/28/20 0046 01/28/20 0500 01/28/20 0753  BP: 118/83 125/75 116/63 109/70  Pulse: 91 90 86 87  Resp:  _0 Temp: 97.8 F (36.6 C) 97.7 F (36.5 C) 97.9 F (36.6 C) 97.6 F (36.4 C)  TempSrc: Oral Oral Oral Oral  SpO2: 93% 96% 95% 96%  Weight:      Height:        Intake/Output Summary (Last 24 hours) at 01/28/2020 0848 Last data filed at 01/28/2020 0754 Gross per 24 hour  Intake 240 ml  Output 375 ml  Net -135 ml   Filed Weights   01/24/20 0526 01/25/20 0500 01/26/20 0351  Weight: 70.4 kg 69.7 kg 69.7 kg    Examination:  General: A/O x4, positive acute on chronic respiratory distress Eyes: negative scleral hemorrhage, negative anisocoria, negative icterus ENT: Negative Runny nose, negative gingival bleeding, Neck:  Negative scars, masses, torticollis, lymphadenopathy, JVD Lungs: tachypneic extremely poor air movement bilaterally without wheezes or crackles Cardiovascular: Tachycardic without murmur gallop or rub normal S1 and S2 Abdomen: negative abdominal pain, nondistended, positive soft, bowel sounds, no rebound, no ascites, no appreciable mass Extremities: No significant cyanosis, clubbing, or edema bilateral lower extremities Skin: Negative rashes, lesions, ulcers Psychiatric:  Negative depression, negative anxiety, negative fatigue, negative mania  Central nervous system:  Cranial nerves II through XII intact, tongue/uvula midline, all extremities muscle strength 5/5, sensation intact throughout, negative  dysarthria, negative expressive aphasia, negative receptive aphasia.  .     Data Reviewed: Care during the described time interval was provided by me .  I have reviewed this patient's available data, including medical history, events of note, physical examination, and all test results as part of my evaluation.  CBC: Recent Labs  Lab 01/23/20 0155  WBC 10.6*  NEUTROABS 9.1*  HGB 14.0  HCT 43.3  MCV 89.5  PLT 950   Basic Metabolic Panel: Recent Labs  Lab 01/23/20 0155  NA 136  K 4.9  CL 95*  CO2 34*  GLUCOSE 95  BUN 27*  CREATININE 0.79  CALCIUM 8.5*   GFR: Estimated Creatinine Clearance: 59.8 mL/min (by C-G formula based on SCr of 0.79 mg/dL). Liver Function Tests: Recent Labs  Lab 01/23/20 0155  AST 23  ALT 51*  ALKPHOS 64  BILITOT 0.8  PROT 5.2*  ALBUMIN 2.5*   No results for input(s): LIPASE, AMYLASE in the last 168 hours. No results for input(s): AMMONIA in the last 168 hours. Coagulation Profile: No results for input(s): INR, PROTIME in the last 168 hours. Cardiac Enzymes: No results for input(s): CKTOTAL, CKMB, CKMBINDEX, TROPONINI in the last 168 hours. BNP (  last 3 results) Recent Labs    09/19/19 1105  PROBNP 275   HbA1C: No results for input(s): HGBA1C in the last 72 hours. CBG: Recent Labs  Lab 01/23/20 0710  GLUCAP 82   Lipid Profile: No results for input(s): CHOL, HDL, LDLCALC, TRIG, CHOLHDL, LDLDIRECT in the last 72 hours. Thyroid Function Tests: No results for input(s): TSH, T4TOTAL, FREET4, T3FREE, THYROIDAB in the last 72 hours. Anemia Panel: No results for input(s): VITAMINB12, FOLATE, FERRITIN, TIBC, IRON, RETICCTPCT in the last 72 hours. Sepsis Labs: No results for input(s): PROCALCITON, LATICACIDVEN in the last 168 hours.  Recent Results (from the past 240 hour(s))  MRSA PCR Screening     Status: None   Collection Time: 01/18/20 10:32 AM   Specimen: Nasal Mucosa; Nasopharyngeal  Result Value Ref Range Status   MRSA by  PCR NEGATIVE NEGATIVE Final    Comment:        The GeneXpert MRSA Assay (FDA approved for NASAL specimens only), is one component of a comprehensive MRSA colonization surveillance program. It is not intended to diagnose MRSA infection nor to guide or monitor treatment for MRSA infections. Performed at [facility], [place] [address], [place], [place] [address]          Radiology Studies: No results found.      Scheduled Meds:  ALPRAZolam  0.5 mg Oral TID   citalopram  10 mg Oral Daily   enoxaparin (LOVENOX) injection  40 mg Subcutaneous Q24H   metoprolol tartrate  12.5 mg Oral BID   Nintedanib  150 mg Oral BID WC   pantoprazole  80 mg Oral Q1200   sodium chloride flush  10-40 mL Intracatheter Q12H   umeclidinium bromide  1 puff Inhalation Daily   Continuous Infusions:   LOS: 12 days    Time spent:40 min    Yacob Wilkerson, Geraldo Docker, MD [facility] Pager [phone]  If 7PM-7AM, please contact night-coverage [email or web address] Password [facility] 01/28/2020, 8:48 AM

## 2020-01-28 NOTE — TOC Progression Note (Signed)
Transition of Care [facility]) - Progression Note    Patient Details  Name: Jason Summers MRN: [id number] Date of Birth: [birth year]  Transition of Care [facility]) CM/SW [place], [place] Phone Number: [phone] 01/28/2020, 3:46 PM  Clinical Narrative:     CSW Kathlee Nations followed up with patients spouse in regards to SNF choice. Patient's spouse chose [facility].  CSW Lattie Haw attempted to follow up with [facility] however had to leave a message.  TOC team will continue to assist with discharge planning needs.  Expected Discharge Plan: Skilled Nursing Facility Barriers to Discharge: Ship broker, Continued Medical Work up, SNF Pending bed offer  Expected Discharge Plan and Services Expected Discharge Plan: [place] In-house Referral: Clinical Social Work                                             Social Determinants of Health (SDOH) Interventions    Readmission Risk Interventions No flowsheet data found.

## 2020-01-29 MED ORDER — GLYCOPYRROLATE 1 MG PO TABS
1.0000 mg | ORAL_TABLET | ORAL | Status: DC | PRN
  Filled 2020-01-29: qty 1

## 2020-01-29 MED ORDER — BIOTENE DRY MOUTH MT LIQD: 15.0000 mL | OROMUCOSAL | Status: DC | PRN

## 2020-01-29 MED ORDER — GLYCOPYRROLATE 0.2 MG/ML IJ SOLN: 0.2000 mg | INTRAMUSCULAR | Status: DC | PRN

## 2020-01-29 MED ORDER — ALBUMIN HUMAN 25 % IV SOLN
25.0000 g | Freq: Once | INTRAVENOUS | Status: AC
  Administered 2020-01-29: 25 g via INTRAVENOUS
  Filled 2020-01-29: qty 100

## 2020-01-29 MED ORDER — SODIUM CHLORIDE 0.9 % IV BOLUS
500.0000 mL | Freq: Once | INTRAVENOUS | Status: AC | PRN
  Administered 2020-01-29: 500 mL via INTRAVENOUS

## 2020-01-29 MED ORDER — LORAZEPAM 2 MG/ML IJ SOLN
1.0000 mg | INTRAMUSCULAR | Status: DC | PRN
  Administered 2020-01-29 – 2020-01-30 (×4): 1 mg via INTRAVENOUS
  Filled 2020-01-29 (×4): qty 1

## 2020-01-29 MED ORDER — GLYCOPYRROLATE 0.2 MG/ML IJ SOLN
0.2000 mg | INTRAMUSCULAR | Status: DC | PRN
  Administered 2020-01-30: 0.2 mg via INTRAVENOUS
  Filled 2020-01-29: qty 1

## 2020-01-29 MED ORDER — IPRATROPIUM-ALBUTEROL 0.5-2.5 (3) MG/3ML IN SOLN
3.0000 mL | Freq: Four times a day (QID) | RESPIRATORY_TRACT | Status: DC
  Filled 2020-01-29: qty 3

## 2020-01-29 MED ORDER — CHLORPROMAZINE HCL 25 MG PO TABS
25.0000 mg | ORAL_TABLET | Freq: Four times a day (QID) | ORAL | Status: DC | PRN
  Filled 2020-01-29: qty 1

## 2020-01-29 MED ORDER — MIDODRINE HCL 5 MG PO TABS
10.0000 mg | ORAL_TABLET | Freq: Three times a day (TID) | ORAL | Status: DC
  Administered 2020-01-29: 10 mg via ORAL
  Filled 2020-01-29: qty 2

## 2020-01-29 MED ORDER — ACETAMINOPHEN 325 MG PO TABS: 650.0000 mg | ORAL_TABLET | Freq: Four times a day (QID) | ORAL | Status: DC | PRN

## 2020-01-29 MED ORDER — LORAZEPAM 1 MG PO TABS: 1.0000 mg | ORAL_TABLET | ORAL | Status: DC | PRN

## 2020-01-29 MED ORDER — ACETAMINOPHEN 650 MG RE SUPP: 650.0000 mg | Freq: Four times a day (QID) | RECTAL | Status: DC | PRN

## 2020-01-29 MED ORDER — MORPHINE SULFATE (CONCENTRATE) 10 MG/0.5ML PO SOLN
5.0000 mg | ORAL | Status: DC | PRN
  Administered 2020-01-29 – 2020-01-30 (×6): 5 mg via SUBLINGUAL
  Filled 2020-01-29 (×5): qty 0.5

## 2020-01-29 MED ORDER — HALOPERIDOL 0.5 MG PO TABS
0.5000 mg | ORAL_TABLET | ORAL | Status: DC | PRN
  Filled 2020-01-29: qty 1

## 2020-01-29 MED ORDER — MORPHINE SULFATE (CONCENTRATE) 10 MG/0.5ML PO SOLN
5.0000 mg | ORAL | Status: DC | PRN
  Filled 2020-01-29: qty 0.5

## 2020-01-29 MED ORDER — LORAZEPAM 2 MG/ML PO CONC: 1.0000 mg | ORAL | Status: DC | PRN

## 2020-01-29 MED ORDER — POLYVINYL ALCOHOL 1.4 % OP SOLN
1.0000 [drp] | Freq: Four times a day (QID) | OPHTHALMIC | Status: DC | PRN
  Filled 2020-01-29: qty 15

## 2020-01-29 MED ORDER — HALOPERIDOL LACTATE 5 MG/ML IJ SOLN
0.5000 mg | INTRAMUSCULAR | Status: DC | PRN
  Administered 2020-01-30: 0.5 mg via INTRAVENOUS
  Filled 2020-01-29: qty 1

## 2020-01-29 MED ORDER — ONDANSETRON HCL 4 MG/2ML IJ SOLN: 4.0000 mg | Freq: Four times a day (QID) | INTRAMUSCULAR | Status: DC | PRN

## 2020-01-29 MED ORDER — ONDANSETRON 4 MG PO TBDP: 4.0000 mg | ORAL_TABLET | Freq: Four times a day (QID) | ORAL | Status: DC | PRN

## 2020-01-29 MED ORDER — HALOPERIDOL LACTATE 2 MG/ML PO CONC
0.5000 mg | ORAL | Status: DC | PRN
  Filled 2020-01-29: qty 0.3

## 2020-01-29 NOTE — TOC Progression Note (Signed)
Transition of Care [facility]) - Progression Note    Patient Details  Name: Jason Summers MRN: [id number] Date of Birth: 12-Feb-1932  Transition of Care [facility]) CM/SW [place], LCSW Phone Number: [phone] 01/29/2020, 8:54 AM  Clinical Narrative:    CSW received a return call from [place] at [facility] and she notified CSW that they will not have a bed until most likely on Wednesday or Thursday.  TOCd team will follow up with family.   Expected Discharge Plan: Skilled Nursing Facility Barriers to Discharge: Ship broker, Continued Medical Work up, SNF Pending bed offer  Expected Discharge Plan and Services Expected Discharge Plan: [place] In-house Referral: Clinical Social Work                                             Social Determinants of Health (SDOH) Interventions    Readmission Risk Interventions No flowsheet data found.

## 2020-01-29 NOTE — Progress Notes (Signed)
Pt. BP 82/47 (58) HR=86 SP02= 94 HFNC 6L Dr.Wood notified. Verbal order to discontinue 12.2m metoprolol. Pt denies dizziness or lightheaded. Will continue to monitor pt.    APhoebe Sharps RN

## 2020-01-29 NOTE — Significant Event (Addendum)
Rapid Response Event Note   Reason for Call :  hypotension Initial Focused Assessment:  Received a call that the patient was hypotensive but was asymptomatic.  When I arrived to the unit the patient was comfortably resting in bed.  His wife was at the bedside and tearful.  The nurse stated that the patient had been sleepy today, more than normal.  The patient was oriented.  Nurse let Dr. Sherral Hammers know what was going on.  He ordered a run of albumin.  BP 93/55 Pulse 82 Resp 20  O2 97 on 6 L [place]    Interventions:  Vitals taken, albumin started, and 500 cc fluid bolus started  Plan of Care:  Dr was made aware of the situation.  Will keep patient on unit but continue to monitor the patient.  RN was told to notify me if there is any further change in the patient's status.  Addendum Received another call from the RN at 1720 stating that the patient's bp had dropped again.  I did a manual BP and he was 86/54 lying.  Dr Sherral Hammers was notified and we received an order for midodrine 10 mg TID.  The doctor is reaching out to the family to discuss options for patient's care.     Event Summary:   MD Notified: Dr. Sherral Hammers Call Time: [place] Time: 1430 End Time: [place], RN

## 2020-01-29 NOTE — Progress Notes (Addendum)
PROGRESS NOTE    Jason Summers  PQZ:[id number] DOB: [birth year] DOA: 01/15/2020 PCP: [name], NP     Brief Narrative:   86 y.o. WM PMHx  lung cancer-in remission, post chemo and radiation therapy, idiopathic pulmonary fibrosis on chronic steroids andNintendanib, sinus tachycardia, hyperlipidemia, depression/anxiety, GERD   Presents to emergency department with worsening shortness of breath since couple of days.  Patient's wife at the bedside tells me that patient was hospitalized from 12/28/2019 to 01/04/2020 with sepsis/acute hypoxic respiratory failure suspect secondary to IPF exacerbation and CAP and discharged home in a stable condition on p.o. doxy.  Patient's wife tells me that since his discharge from the hospital patient has progressive worsening shortness of breath, he is unable to walk,and this morning he was unable to catch his breath therefore she called EMS and brought patient to the emergency department for further evaluation and management.   Patient's wife tells me that patient had tachycardia at home therefore his PCP started him on metoprolol 12.5 mg twice daily which helps with his tachycardia.  His prednisone dose was also increased from 15 mg to 30 mg.  Patient has chronic productive cough with white phlegm however denies change in phlegm color, worsening of cough, wheezing, chest pain, leg swelling, orthopnea, PND, fever, chills, nausea, vomiting, headache, blurry vision, decreased appetite, abdominal pain.  Reports loose stools since last 2 days however denies melena.  No history of smoking, alcohol, illicit drug use.  He uses 4 L of oxygen at baseline at home.  ED Course: Upon arrival to ED: Patient tachypneic, requiring 4 L of oxygen via nasal cannula which is his baseline, afebrile with no leukocytosis.  Initial labs including CBC: WNL CMP shows elevated liver enzymes of AST: 109, ALT: 144, BNP: 114, COVID-19 negative, chest x-ray shows chronic lung disease with  suspected superimposed right midlung pneumonia.  Patient was given IV Vanco and cefepime for the concern of hospital-acquired pneumonia.  [facility] hospital[facility] consulted for    Subjective: 8/22 A/O x4, extremely weak.  Follows all commands   Assessment & Plan: Covid vaccination;   Principal Problem:   Right middle lobe pneumonia Active Problems:   Gastroesophageal reflux disease   IPF (idiopathic pulmonary fibrosis) (HCC)   HTN (hypertension)   HLD (hyperlipidemia)   Depression with anxiety   Chronic hypoxemic respiratory failure (HCC)   Elevated liver enzymes   Palliative care by specialist   Goals of care, counseling/discussion   DNR (do not resuscitate)   Dyspnea on exertion   Acute on chronic respiratory failure with hypoxia/HCAP/IPF flareup: -PCCM was consulted to complete his  course of IV cefepime. -Continue steroid taper  -O2 demands continue to increase in -8/22 LCSW [place] working with granddaughter this a.m to see if they can obtain alternate SNF for patient.  Will await further recommendations. -Initially patient was to follow-up with PCCM in 2 to 4 weeks unlikely to survive that long.  Essential HTN -8/22 DC all BP medication patient borderline hypotensive -8/22 Albumin 25 g x 1  Elevated liver enzymes: -Enzymes for the most part WNL  Anxiety: Continue Xanax 0.5 TID PRN.  Depression: -Celexa 10 mg daily  GERD: Continue PPI.  Palliative care by specialist/ Goals of care, counseling/discussion Pulmonary spoke with the family and explained to them that the patient has a poor prognosis he agreed to meet with hospice and palliative care.  They do agree with outpatient palliative care referral. DNR (do not resuscitate) -8/21 LCSW [place] spoke with spouse and they have  decided to proceed with SNF placement.  Specifically [facility].      ADDENDUM; 8/22 this afternoon patient's respiratory status and circulatory status deteriorated.  Patient began to  require increasing amounts of O2, and BP began to decline precipitously.  Contacted family spoke with [place] and Blake Divine (daughter and son) and expressed my concern that their father may not survive another 48 hours to make it to the selected SNF.  After a long discussion it was decided to make patient comfort care in order that the maximum amount of family members could visit with him under the current Covid restrictions.  Anticipate hospital death.   DVT prophylaxis: Lovenox Code Status: DNR Family Communication:  Status is: Inpatient    Dispo: The patient is from: Home              Anticipated d/c is to: [facility]              Anticipated d/c date is: Next 24 to 48 hours              Patient currently unstable      Consultants:  PCCM  Procedures/Significant Events:    I have personally reviewed and interpreted all radiology studies and my findings are as above.  VENTILATOR SETTINGS: HFNC 8/22 Flow; 9 L/min SPO2 94%   Cultures   Antimicrobials: Anti-infectives (From admission, onward)   Start     Ordered Stop   01/17/20 0000  vancomycin (VANCOREADY) IVPB 750 mg/150 mL  Status:  Discontinued        02/01/2020 1119 01/18/20 1436   01/25/2020 2200  ceFEPIme (MAXIPIME) 2 g in sodium chloride 0.9 % 100 mL IVPB        02/01/2020 1342 01/22/20 2151   01/25/2020 1030  ceFEPIme (MAXIPIME) 2 g in sodium chloride 0.9 % 100 mL IVPB        01/27/2020 1016 02/07/2020 1232   02/03/2020 1030  vancomycin (VANCOREADY) IVPB 1500 mg/300 mL        02/06/2020 1017 01/21/2020 1441       Devices    LINES / TUBES:      Continuous Infusions:   Objective: Vitals:   01/29/20 0510 01/29/20 0747 01/29/20 0849 01/29/20 1118  BP: (!) 107/58  105/60 (!) 85/58  Pulse: 88  (!) 102 82  Resp: _0 Temp: 97.6 F (36.4 C)  (!) 97.4 F (36.3 C) 98 F (36.7 C)  TempSrc: Oral  Oral Oral  SpO2: 93% 94% 97% 97%  Weight:      Height:        Intake/Output Summary (Last 24 hours) at  01/29/2020 1135 Last data filed at 01/29/2020 1000 Gross per 24 hour  Intake 170 ml  Output 300 ml  Net -130 ml   Filed Weights   01/24/20 0526 01/25/20 0500 01/26/20 0351  Weight: 70.4 kg 69.7 kg 69.7 kg    Examination:  General: A/O x4, positive acute on chronic respiratory distress Eyes: negative scleral hemorrhage, negative anisocoria, negative icterus ENT: Negative Runny nose, negative gingival bleeding, Neck:  Negative scars, masses, torticollis, lymphadenopathy, JVD Lungs: tachypneic extremely poor air movement bilaterally without wheezes or crackles Cardiovascular: Tachycardic without murmur gallop or rub normal S1 and S2 Abdomen: negative abdominal pain, nondistended, positive soft, bowel sounds, no rebound, no ascites, no appreciable mass Extremities: No significant cyanosis, clubbing, or edema bilateral lower extremities Skin: Negative rashes, lesions, ulcers Psychiatric:  Negative depression, negative anxiety, negative fatigue, negative mania  Central nervous system:  Cranial nerves II through XII intact, tongue/uvula midline, all extremities muscle strength 5/5, sensation intact throughout, negative dysarthria, negative expressive aphasia, negative receptive aphasia.  .     Data Reviewed: Care during the described time interval was provided by me .  I have reviewed this patient's available data, including medical history, events of note, physical examination, and all test results as part of my evaluation.  CBC: Recent Labs  Lab 01/23/20 0155  WBC 10.6*  NEUTROABS 9.1*  HGB 14.0  HCT 43.3  MCV 89.5  PLT 191   Basic Metabolic Panel: Recent Labs  Lab 01/23/20 0155  NA 136  K 4.9  CL 95*  CO2 34*  GLUCOSE 95  BUN 27*  CREATININE 0.79  CALCIUM 8.5*   GFR: Estimated Creatinine Clearance: 59.8 mL/min (by C-G formula based on SCr of 0.79 mg/dL). Liver Function Tests: Recent Labs  Lab 01/23/20 0155  AST 23  ALT 51*  ALKPHOS 64  BILITOT 0.8  PROT  5.2*  ALBUMIN 2.5*   No results for input(s): LIPASE, AMYLASE in the last 168 hours. No results for input(s): AMMONIA in the last 168 hours. Coagulation Profile: No results for input(s): INR, PROTIME in the last 168 hours. Cardiac Enzymes: No results for input(s): CKTOTAL, CKMB, CKMBINDEX, TROPONINI in the last 168 hours. BNP (last 3 results) Recent Labs    09/19/19 1105  PROBNP 275   HbA1C: No results for input(s): HGBA1C in the last 72 hours. CBG: Recent Labs  Lab 01/23/20 0710  GLUCAP 82   Lipid Profile: No results for input(s): CHOL, HDL, LDLCALC, TRIG, CHOLHDL, LDLDIRECT in the last 72 hours. Thyroid Function Tests: No results for input(s): TSH, T4TOTAL, FREET4, T3FREE, THYROIDAB in the last 72 hours. Anemia Panel: No results for input(s): VITAMINB12, FOLATE, FERRITIN, TIBC, IRON, RETICCTPCT in the last 72 hours. Sepsis Labs: No results for input(s): PROCALCITON, LATICACIDVEN in the last 168 hours.  No results found for this or any previous visit (from the past 240 hour(s)).       Radiology Studies: No results found.      Scheduled Meds: . ALPRAZolam  0.5 mg Oral TID  . citalopram  10 mg Oral Daily  . enoxaparin (LOVENOX) injection  40 mg Subcutaneous Q24H  . metoprolol tartrate  12.5 mg Oral BID  . Nintedanib  150 mg Oral BID WC  . pantoprazole  80 mg Oral Q1200  . sodium chloride flush  10-40 mL Intracatheter Q12H  . umeclidinium bromide  1 puff Inhalation Daily   Continuous Infusions:   LOS: 13 days    Time spent:40 min    Baillie Mohammad, Geraldo Docker, MD [facility] Pager [phone]  If 7PM-7AM, please contact night-coverage [email or web address] Password [facility] 01/29/2020, 11:35 AM

## 2020-01-29 NOTE — TOC Progression Note (Signed)
Transition of Care [facility]) - Progression Note    Patient Details  Name: Jason Summers MRN: [id number] Date of Birth: 1931/10/19  Transition of Care [facility]) CM/SW [place], [place] Phone Number: [phone] 01/29/2020, 1:11 PM  Clinical Narrative:    CSW spoke with patient's daughter Thurman Coyer and family has chosen [facility]. CSW confirmed bed with Tanza at [facility]. CSW started [facility] authorization with a start date of 19-Feb-2020. Navi Reference # -[id number]  CSW alerted MD Sherral Hammers of patent's discharge plan. New COVID test requested. Per MD Sherral Hammers patient should be discharged in the morning.  Please reach out to [facility] from facility in the morning to assist with the discharge. [phone]   Expected Discharge Plan: Skilled Nursing Facility Barriers to Discharge: Ship broker, Continued Medical Work up, SNF Pending bed offer  Expected Discharge Plan and Services Expected Discharge Plan: [place] In-house Referral: Clinical Social Work                                             Social Determinants of Health (SDOH) Interventions    Readmission Risk Interventions No flowsheet data found.

## 2020-01-29 NOTE — TOC Progression Note (Signed)
Transition of Care [facility]) - Progression Note    Patient Details  Name: Jason Summers MRN: [id number] Date of Birth: [birth year]  Transition of Care [facility]) CM/SW [place], LCSW Phone Number: [phone] 01/29/2020, 8:42 AM  Clinical Narrative:     CSW reached out to [place] at [facility] this morning to ascertain if they could accept patient and to check about the insurance authorization if needed. CSW is awaiting a call back.  TOC team will continue to assist with discharge planning needs.  Expected Discharge Plan: Skilled Nursing Facility Barriers to Discharge: Ship broker, Continued Medical Work up, SNF Pending bed offer  Expected Discharge Plan and Services Expected Discharge Plan: [place] In-house Referral: Clinical Social Work                                             Social Determinants of Health (SDOH) Interventions    Readmission Risk Interventions No flowsheet data found.

## 2020-01-29 NOTE — TOC Progression Note (Signed)
Transition of Care [facility]) - Progression Note    Patient Details  Name: Jason Summers MRN: [id number] Date of Birth: [birth year]  Transition of Care [facility]) CM/SW [place], LCSW Phone Number: [phone] 01/29/2020, 10:55 AM  Clinical Narrative:    CSW have spoke with patient's daughter Thurman Coyer on several occassions this morning in regards to SNF placement. CSW explained that [facility] did not have a bed.  Thurman Coyer expressed an interest in [facility] and asked CSW to call to see if they had a bed.  CSW called [facility] and spoke with Tye [place] and she informed CSW that they could accept patient. CSW called Thurman Coyer back and informed her that they could accept. Thurman Coyer then inquired about [facility]. CSW reached out to [place] at [facility] and they stated that they had a bed patient on Monday.  CSW called Thurman Coyer back and gave her an updated. Thurman Coyer then requested to go see [facility]. CSW reached back out [place] and she stated that the could visit however it would be a skeleton visit due to [place]. CSW followed up again with Thurman Coyer and she stated that she would not visit the facility.  CSW inquired about a decision and Thurman Coyer asked patient's spouse and she did not make a decision as she was upset.  CSW reminded Thurman Coyer that patient is medically stable for discharge therefore a decision has to be made.CSW requested that Thurman Coyer call back CSW once a decision has been made.  TOC team will continue to assist with discharge planning needs.  Expected Discharge Plan: Skilled Nursing Facility Barriers to Discharge: Ship broker, Continued Medical Work up, SNF Pending bed offer  Expected Discharge Plan and Services Expected Discharge Plan: [place] In-house Referral: Clinical Social Work                                             Social Determinants of Health (SDOH) Interventions    Readmission Risk Interventions No  flowsheet data found.

## 2020-01-29 NOTE — Progress Notes (Signed)
Pt BP dropped 68/54 manual BP obtained 86/54. 500 ml bolus started and Dr.wood notified. Verbal order midodrine 10 mg TID  placed  And given to pt.. BP _0  102/54 HR 83 SP02 100 HFNC 8L. Will continue to monitor  Phoebe Sharps, RN

## 2020-01-30 ENCOUNTER — Telehealth: Payer: Self-pay | Admitting: Internal Medicine

## 2020-01-30 DIAGNOSIS — F411 Generalized anxiety disorder: Secondary | ICD-10-CM

## 2020-01-30 MED ORDER — MORPHINE SULFATE (PF) 2 MG/ML IV SOLN
1.0000 mg | INTRAVENOUS | Status: DC
  Filled 2020-01-30: qty 1

## 2020-01-30 MED ORDER — MORPHINE BOLUS VIA INFUSION
1.0000 mg | INTRAVENOUS | Status: DC | PRN
  Filled 2020-01-30: qty 2

## 2020-01-30 MED ORDER — HALOPERIDOL LACTATE 5 MG/ML IJ SOLN: 2.0000 mg | INTRAMUSCULAR | Status: DC | PRN

## 2020-01-30 MED ORDER — MORPHINE 100MG IN NS 100ML (1MG/ML) PREMIX INFUSION: 1.0000 mg/h | INTRAVENOUS | Status: DC

## 2020-01-30 MED ORDER — LORAZEPAM 2 MG/ML IJ SOLN: 1.0000 mg | Freq: Four times a day (QID) | INTRAMUSCULAR | Status: DC

## 2020-01-30 MED ORDER — MORPHINE SULFATE (PF) 2 MG/ML IV SOLN
1.0000 mg | INTRAVENOUS | Status: DC | PRN
  Administered 2020-01-30 (×2): 2 mg via INTRAVENOUS
  Filled 2020-01-30: qty 1

## 2020-02-02 NOTE — Telephone Encounter (Signed)
Condolence card has been obtained. Will give to MR to sign at next clinic. Nothing further needed.

## 2020-02-06 ENCOUNTER — Telehealth: Payer: Self-pay | Admitting: Internal Medicine

## 2020-02-06 DIAGNOSIS — J9611 Chronic respiratory failure with hypoxia: Secondary | ICD-10-CM

## 2020-02-06 DIAGNOSIS — J84112 Idiopathic pulmonary fibrosis: Secondary | ICD-10-CM

## 2020-02-06 NOTE — Telephone Encounter (Signed)
Order has been placed to have pt's O2 discontinued.  Called and spoke with pt's wife letting her know that this had been done and she verbalized understanding. Nothing further needed.

## 2020-02-06 NOTE — Telephone Encounter (Signed)
Patient's Wife, Mickel Baas called requesting a discontinue O2 order be placed for [employer] to come pick up O2.  Message routed to Dr [place] Caller to ok order

## 2020-02-06 NOTE — Telephone Encounter (Signed)
Yes dc o2

## 2020-02-08 NOTE — Progress Notes (Signed)
Called to room by family. Patient's wife and others at bedside. Patient unresponsive. No spontaneous respirations. No palpable pulse. Pupils fixed. No response to pain. Patient listed as NCB. Comfort care measures. Second RN, Mauri Reading in to assess patient. M.D. notified. Patient pronounced at 1438.  Emotional support given to family Palliative services notified.

## 2020-02-08 NOTE — Social Work (Addendum)
CSW acknowledging pt now under comfort care. Withdrew pt authorization for SNF at this time. Will follow for disposition should hospice services or residential hospice become appropriate.   [place], MSW, [place] Work

## 2020-02-08 NOTE — Progress Notes (Addendum)
Daily Progress Note   Patient Name: Jason Summers       Date: Jan 31, 2020 DOB: [birth year]  Age: 86 y.o. MRN#: [id number] Attending Physician: Allie Bossier, MD Primary Care Physician: [name], NP Admit Date: 01/20/2020  Reason for Consultation/Follow-up: Establishing goals of care and Terminal Care  Subjective: Patient drowsy but will wake to voice. When he wakes, he becomes slightly agitated and starts working harder to breath with tachypnea and accessory muscle use. Incomprehensible speech.   GOC:  Family at bedside: wife, daughter, and son. Discussed unfortunate decline yesterday evening and plan for comfort measures only. Family understands poor prognosis. Discussed comfort focused care plan including medications and comfort feeds. Patient had multiple doses of prn morphine over night shift. Family is agreeable with scheduling morphine for comfort. Explained that we could consider morphine infusion if he remains uncomfortable despite morphine IV pushes. Family understands. Answered questions and concerns.   Chaplain offered. Their Catholic priest will be visiting this afternoon. PMT contact information given.   ADDENDUM 1400: Spoke with RN. Patient remains uncomfortable despite morphine x3 and ativan x2 this morning/afternoon. Discussed initiation of morphine infusion with RN/family. Family agreeable with starting morphine infusion with hopes of helping make him more comfortable. Orders placed.   Length of Stay: 14  Current Medications: Scheduled Meds:  . ALPRAZolam  0.5 mg Oral TID  . citalopram  10 mg Oral Daily  . enoxaparin (LOVENOX) injection  40 mg Subcutaneous Q24H  . midodrine  10 mg Oral TID WC  . Nintedanib  150 mg Oral BID WC  . pantoprazole  80 mg Oral Q1200  . sodium  chloride flush  10-40 mL Intracatheter Q12H  . umeclidinium bromide  1 puff Inhalation Daily    Continuous Infusions:   PRN Meds: acetaminophen **OR** acetaminophen, antiseptic oral rinse, calcium carbonate, chlorproMAZINE, glycopyrrolate **OR** glycopyrrolate **OR** glycopyrrolate, haloperidol **OR** haloperidol **OR** haloperidol lactate, levalbuterol, LORazepam **OR** LORazepam **OR** LORazepam, morphine, morphine CONCENTRATE **OR** morphine CONCENTRATE, ondansetron **OR** ondansetron (ZOFRAN) IV, oxyCODONE, polyvinyl alcohol, sodium chloride flush  Physical Exam Vitals and nursing note reviewed.  Constitutional:      Appearance: He is cachectic. He is ill-appearing.  HENT:     Head: Normocephalic and atraumatic.  Pulmonary:     Breath sounds: Decreased breath sounds present.     Comments: Intermittent dyspnea  at rest with accessory muscle use Skin:    General: Skin is warm and dry.     Coloration: Skin is pale.  Neurological:     Mental Status: He is easily aroused.     Comments: Wakes to voice, drowsy, agitated, incomprehensible speech  Psychiatric:        Attention and Perception: He is inattentive.        Cognition and Memory: Cognition is impaired.             Vital Signs: BP 122/78   Pulse (!) 114   Temp 99.1 F (37.3 C) (Axillary)   Resp (!) 22   Ht _0  (1.676 m)   Wt 72.6 kg   SpO2 90%   BMI 25.83 kg/m  SpO2: SpO2: 90 % O2 Device: O2 Device: Nasal Cannula O2 Flow Rate: O2 Flow Rate (L/min): 6 L/min  Intake/output summary:   Intake/Output Summary (Last 24 hours) at 02-20-20 1025 Last data filed at 01/29/2020 1300 Gross per 24 hour  Intake 120 ml  Output --  Net 120 ml   LBM: Last BM Date: 01/27/20 Baseline Weight: Weight: 72.6 kg Most recent weight: Weight: 72.6 kg       Palliative Assessment/Data: PPS 10%    Flowsheet Rows     Most Recent Value  Intake Tab  Referral Department Hospitalist  Unit at Time of Referral Cardiac/Telemetry Unit   Palliative Care Primary Diagnosis Pulmonary  Date Notified 01/18/20  Palliative Care Type New Palliative care  Reason for referral Clarify Goals of Care  Date of Admission 01/17/2020  Date first seen by Palliative Care 01/20/20  # of days Palliative referral response time 2 Day(s)  # of days IP prior to Palliative referral 2  Clinical Assessment  Psychosocial & Spiritual Assessment  Palliative Care Outcomes      Patient Active Problem List   Diagnosis Date Noted  . Palliative care by specialist   . Goals of care, counseling/discussion   . DNR (do not resuscitate)   . Dyspnea on exertion   . HTN (hypertension) 01/27/2020  . HLD (hyperlipidemia) 01/18/2020  . Depression with anxiety 01/22/2020  . Right middle lobe pneumonia 01/24/2020  . Chronic hypoxemic respiratory failure ([place]) 01/12/2020  . Elevated liver enzymes 01/19/2020  . Sepsis ([place]) 12/30/2019  . Pulmonary hypertension, unspecified ([place])   . Coordination of complex care 09/19/2019  . Compression fracture of body of thoracic vertebra ([place]) 02/09/2019  . IPF (idiopathic pulmonary fibrosis) ([place]) 07/14/2018  . Anxiety 07/14/2018  . Acute rhinitis 08/10/2017  . Eustachian tube dysfunction, bilateral 08/10/2017  . Persistent headaches 02/03/2017  . Chest pain in adult 12/16/2016  . Chronic right-sided thoracic back pain 12/16/2016  . Pleural effusion on left 08/14/2016  . Pulmonary emphysema ([place]) 08/14/2016  . Radiation fibrosis of lung ([place]) 08/14/2016  . Shortness of breath 08/14/2016  . History of esophageal stricture 05/15/2016  . Gastroesophageal reflux disease 05/15/2016  . Nausea and vomiting 05/15/2016  . Headache 01/30/2016  . Odynophagia 11/06/2015  . Encounter for antineoplastic chemotherapy 10/15/2015  . Bilateral epiphora 09/25/2015  . Nuclear sclerosis of right eye 09/25/2015  . Pseudophakia, left eye 09/25/2015  . Squamous blepharitis of upper eyelids of both eyes 09/25/2015  . Non-small cell  cancer of left lung ([place]) 09/24/2015  . Mediastinal adenopathy 09/07/2015  . Mass of lingula of lung 09/07/2015  . Multiple pulmonary nodules 08/27/2015  . Mediastinal lymphadenopathy 08/27/2015  . Osteoarthritis 07/17/2010  . Dysphagia 07/17/2010  . DYSPHAGIA,  PHARYNGOESOPHAGEAL PHASE 05/07/2010  . HYPERCHOLESTEROLEMIA, MILD 09/06/2008  . SPRAIN AND STRAIN OF SACROILIAC 09/06/2008  . Acute stress disorder 10/27/2007  . Blush 09/23/2007  . Soft tissue lesion of shoulder region 04/22/2007  . GANGLION OF JOINT 04/22/2007  . HEMORRHOIDS, INTERNAL 02/18/2007    Palliative Care Assessment & Plan   Patient Profile: Per intake H&P -->86 year old male prior history of lung cancer in remission, s/p chemo and radiation therapy, idiopathic pulmonary fibrosis on chronic steroids, sinus tachycardia, hyperlipidemia, depression, anxiety, GERD presents to ED with worsening shortness of breath. He was admitted for IPF exacerbation and community-acquired pneumonia from 7/21/202127/28/2021 and discharged home in stable condition on doxycycline. But presents to ED with progression of shortness of breath. Palliative care was asked to aid in goals of care conversations.  Assessment: Acute on chronic respiratory failure with hypoxia IPF exacerbation HCAP Anxiety Depression  Recommendations/Plan:  Clinical decline on 01/29/20. Following conversation with Dr. Sherral Hammers, family decision made for transition to comfort measures only, understanding poor prognosis.  Liberalized comfort medications on MAR. Schedule IV Morphine as patient is requiring frequent prns. Consider IV morphine gtt if he remains uncomfortable despite IV pushes.   Comfort feeds per patient/family request.  Continue frequent oral care and repositioning.  Catholic priest visiting this afternoon.   Unrestricted visitor access as patient is nearing EOL.  PMT will follow along for symptom management and support.   ADDENDUM 1400:  Morphine continuous infusion initiated 67m/hr. RN may bolus morphine via infusion 1-231mIV q1578mprn pain/dyspnea/air hunger/tachypnea. Scheduled Ativan 1mg4m q6h for anxiety/agitation.   Goals of Care and Additional Recommendations:  Limitations on Scope of Treatment: Full Comfort Care  Code Status: DNR   Code Status Orders  (From admission, onward)         Start     Ordered   01/29/20 1816  Do not attempt resuscitation (DNR)  Continuous       Question Answer Comment  In the event of cardiac or respiratory ARREST Do not call a "code blue"   In the event of cardiac or respiratory ARREST Do not perform Intubation, CPR, defibrillation or ACLS   In the event of cardiac or respiratory ARREST Use medication by any route, position, wound care, and other measures to relive pain and suffering. May use oxygen, suction and manual treatment of airway obstruction as needed for comfort.      01/29/20 1821        Code Status History    Date Active Date Inactive Code Status Order ID Comments User Context   01/20/2020 0946 01/29/2020 1821 DNR [id number] Rumpf Inpatient   01/15/2020 1230 01/20/2020 0946 Full Code [id number] Jewel ED   12/28/2019 1330 01/04/2020 2304 Partial Code [id number] Hedge Inpatient   10/06/2019 1043 10/06/2019 1635 Full Code [id number], ChriHarrell Gave Inpatient   Advance Care Planning Activity    Advance Directive Documentation     Most Recent Value  Type of Advance Directive Out of facility DNR (pink MOST or yellow form)  Pre-existing out of facility DNR order (yellow form or pink MOST form) --  "MOST" Form in Place? --       Prognosis:   Hours - Days  Discharge Planning:  Anticipated Hospital Death  Care plan was discussed with RN, Dr. WoodSherral Hammersmily at bedside (wife, daughter, son)  Thank you for allowing the Palliative Medicine Team to assist in the care of this patient.  Total Time 30 Prolonged Time Billed   no      Greater than 50%  of this time was spent counseling and coordinating care related to the above assessment and plan.  Ihor Dow, DNP, FNP-C Palliative Medicine Team  Phone: [phone] Fax: [phone]  Please contact Palliative Medicine Team phone at [phone] for questions and concerns.

## 2020-02-08 NOTE — Death Summary Note (Signed)
Death Summary  Jason Summers SHI:[id number] DOB: July 03, 1931 DOA: January 24, 2020  PCP: [name], NP PCP/Office notified:   Admit date: Jan 24, 2020 Date of Death: 2020-02-07  Final Diagnoses:  Principal Problem:   HCAP (healthcare-associated pneumonia) Active Problems:   Gastroesophageal reflux disease   Dyspnea   IPF (idiopathic pulmonary fibrosis) ([place])   Anxiety state   HTN (hypertension)   HLD (hyperlipidemia)   Depression with anxiety   Chronic hypoxemic respiratory failure ([facility])   Elevated liver enzymes   Palliative care by specialist   Terminal care   DNR (do not resuscitate)   Dyspnea on exertion   Acute on chronic respiratory failure with hypoxia/HCAP/IPF flareup: -PCCM was consulted to complete his  course of IV cefepime. -Continue steroid taper  -O2 demands continue to increase in -8/22 LCSW [place] working with granddaughter this a.m to see if they can obtain alternate SNF for patient.  Will await further recommendations. -Initially patient was to follow-up with PCCM in 2 to 4 weeks unlikely to survive that long.   Essential HTN -8/22 DC all BP medication patient borderline hypotensive -8/22 Albumin 25 g x 1  Elevated liver enzymes: -Enzymes for the most part WNL  Anxiety: Continue Xanax 0.5 TID PRN.  Depression: -Celexa 10 mg daily  GERD: Continue PPI. History of present illness:    Hospital Course:  ADDENDUM; 8/22 this afternoon patient's respiratory status and circulatory status deteriorated.  Patient began to require increasing amounts of O2, and BP began to decline precipitously.  Contacted family spoke with [place] and Blake Divine (daughter and son) and expressed my concern that their father may not survive another 48 hours to make it to the selected SNF.  After a long discussion it was decided to make patient comfort care in order that the maximum amount of family members could visit with him under the current Covid restrictions.  Anticipate hospital  death. Patient passed away peacefully on 02-07-23 at 1438 hrs. with his family at his bedside.  Time: 1438  Signed:  Dia Crawford, MD [facility] [phone]

## 2020-02-08 NOTE — Telephone Encounter (Signed)
   Jason Summers passed February 15, 2020  In bed 6n26. Called on family - wife, son, daughter, d-il and expressed condolence  Plan  - tell DME to pick up o2 from home  - pls get card signed and ready for condolence     SIGNATURE    Dr. Brand Males, M.D., F.C.C.P,  Pulmonary and Critical Care Medicine Staff Physician, [place] Director - Interstitial Lung Disease  Program  [facility] [place] [facility] [place], [place], [address]  Pager: [phone], If no answer  OR between  19:00-7:00h: page [phone] Telephone (clinical office): [phone] Telephone (research): [phone]  4:11 PM Feb 15, 2020

## 2020-02-08 DEATH — deceased

## 2020-02-09 ENCOUNTER — Telehealth: Payer: Self-pay | Admitting: Internal Medicine

## 2020-02-10 NOTE — Telephone Encounter (Signed)
Tried calling Jason Summers and she there was no answer and could not leave msg

## 2020-02-14 NOTE — Telephone Encounter (Signed)
ATC Romie Minus. Line didn't ring and went straight to an automated message. The automated message did say she would attempt to call again for a second time. Will await call.

## 2020-02-16 NOTE — Telephone Encounter (Signed)
Spoke with Jason Summers with BI letting her know that pt had been on OFEV prior to being hospitalized. Stated to her that his pulmonary fibrosis had progressed to the point OFEV was not even working to help lessen it. Stated to her that pt was hospitalized 2023-02-06 and he passed away while he was still admitted to the hospital. Stated to her that the [facility] was not the cause of his death and she verbalized understanding. Nothing further needed.

## 2020-02-17 ENCOUNTER — Ambulatory Visit: Payer: Medicare Other | Admitting: Cardiovascular Disease

## 2020-05-18 ENCOUNTER — Encounter: Payer: Medicare Other | Admitting: Adult Health
